# Patient Record
Sex: Female | Born: 1940
Health system: Southern US, Community
[De-identification: ages and names within clinical notes are randomized; demographics above are authoritative.]

## PROBLEM LIST (undated history)

## (undated) DIAGNOSIS — I351 Nonrheumatic aortic (valve) insufficiency: Secondary | ICD-10-CM

## (undated) DIAGNOSIS — K224 Dyskinesia of esophagus: Secondary | ICD-10-CM

## (undated) DIAGNOSIS — D649 Anemia, unspecified: Secondary | ICD-10-CM

## (undated) DIAGNOSIS — F32A Depression, unspecified: Secondary | ICD-10-CM

## (undated) DIAGNOSIS — I951 Orthostatic hypotension: Secondary | ICD-10-CM

## (undated) DIAGNOSIS — T7840XA Allergy, unspecified, initial encounter: Secondary | ICD-10-CM

## (undated) DIAGNOSIS — R0600 Dyspnea, unspecified: Secondary | ICD-10-CM

## (undated) DIAGNOSIS — E785 Hyperlipidemia, unspecified: Secondary | ICD-10-CM

## (undated) DIAGNOSIS — Z8489 Family history of other specified conditions: Secondary | ICD-10-CM

## (undated) DIAGNOSIS — I4510 Unspecified right bundle-branch block: Secondary | ICD-10-CM

## (undated) DIAGNOSIS — N879 Dysplasia of cervix uteri, unspecified: Secondary | ICD-10-CM

## (undated) DIAGNOSIS — Z972 Presence of dental prosthetic device (complete) (partial): Secondary | ICD-10-CM

## (undated) DIAGNOSIS — F419 Anxiety disorder, unspecified: Secondary | ICD-10-CM

## (undated) DIAGNOSIS — R7303 Prediabetes: Secondary | ICD-10-CM

## (undated) DIAGNOSIS — E039 Hypothyroidism, unspecified: Secondary | ICD-10-CM

## (undated) DIAGNOSIS — R079 Chest pain, unspecified: Secondary | ICD-10-CM

## (undated) DIAGNOSIS — R42 Dizziness and giddiness: Secondary | ICD-10-CM

## (undated) DIAGNOSIS — K219 Gastro-esophageal reflux disease without esophagitis: Secondary | ICD-10-CM

## (undated) DIAGNOSIS — R0609 Other forms of dyspnea: Secondary | ICD-10-CM

## (undated) DIAGNOSIS — M1990 Unspecified osteoarthritis, unspecified site: Secondary | ICD-10-CM

## (undated) DIAGNOSIS — C4490 Unspecified malignant neoplasm of skin, unspecified: Secondary | ICD-10-CM

## (undated) DIAGNOSIS — I5189 Other ill-defined heart diseases: Secondary | ICD-10-CM

## (undated) DIAGNOSIS — G4730 Sleep apnea, unspecified: Secondary | ICD-10-CM

## (undated) DIAGNOSIS — J189 Pneumonia, unspecified organism: Secondary | ICD-10-CM

## (undated) DIAGNOSIS — E119 Type 2 diabetes mellitus without complications: Secondary | ICD-10-CM

## (undated) DIAGNOSIS — I700 Atherosclerosis of aorta: Secondary | ICD-10-CM

## (undated) HISTORY — DX: Anemia, unspecified: D64.9

## (undated) HISTORY — DX: Unspecified malignant neoplasm of skin, unspecified: C44.90

## (undated) HISTORY — DX: Type 2 diabetes mellitus without complications: E11.9

## (undated) HISTORY — DX: Hypothyroidism, unspecified: E03.9

## (undated) HISTORY — DX: Allergy, unspecified, initial encounter: T78.40XA

## (undated) HISTORY — DX: Chest pain, unspecified: R07.9

## (undated) HISTORY — DX: Dysplasia of cervix uteri, unspecified: N87.9

## (undated) HISTORY — DX: Anxiety disorder, unspecified: F41.9

## (undated) HISTORY — PX: OTHER SURGICAL HISTORY: SHX169

## (undated) HISTORY — DX: Gastro-esophageal reflux disease without esophagitis: K21.9

## (undated) HISTORY — DX: Orthostatic hypotension: I95.1

## (undated) HISTORY — PX: EYE SURGERY: SHX253

## (undated) HISTORY — PX: AUGMENTATION MAMMAPLASTY: SUR837

## (undated) HISTORY — PX: TONSILLECTOMY: SUR1361

## (undated) HISTORY — DX: Prediabetes: R73.03

## (undated) HISTORY — DX: Depression, unspecified: F32.A

## (undated) HISTORY — DX: Unspecified right bundle-branch block: I45.10

## (undated) HISTORY — DX: Dizziness and giddiness: R42

## (undated) HISTORY — PX: BREAST EXCISIONAL BIOPSY: SUR124

## (undated) HISTORY — DX: Hyperlipidemia, unspecified: E78.5

---

## 1999-07-01 ENCOUNTER — Encounter: Admission: RE | Admit: 1999-07-01 | Discharge: 1999-07-01 | Payer: Self-pay | Admitting: *Deleted

## 1999-07-01 ENCOUNTER — Encounter: Payer: Self-pay | Admitting: *Deleted

## 1999-07-09 ENCOUNTER — Other Ambulatory Visit: Admission: RE | Admit: 1999-07-09 | Discharge: 1999-07-09 | Payer: Self-pay | Admitting: *Deleted

## 2000-07-01 ENCOUNTER — Encounter: Payer: Self-pay | Admitting: *Deleted

## 2000-07-01 ENCOUNTER — Encounter: Admission: RE | Admit: 2000-07-01 | Discharge: 2000-07-01 | Payer: Self-pay | Admitting: *Deleted

## 2000-08-28 ENCOUNTER — Other Ambulatory Visit: Admission: RE | Admit: 2000-08-28 | Discharge: 2000-08-28 | Payer: Self-pay | Admitting: *Deleted

## 2000-08-28 ENCOUNTER — Encounter (INDEPENDENT_AMBULATORY_CARE_PROVIDER_SITE_OTHER): Payer: Self-pay | Admitting: Specialist

## 2001-06-09 HISTORY — PX: CARDIAC CATHETERIZATION: SHX172

## 2001-07-05 ENCOUNTER — Encounter: Payer: Self-pay | Admitting: *Deleted

## 2001-07-05 ENCOUNTER — Encounter: Admission: RE | Admit: 2001-07-05 | Discharge: 2001-07-05 | Payer: Self-pay | Admitting: *Deleted

## 2002-07-14 ENCOUNTER — Encounter: Payer: Self-pay | Admitting: *Deleted

## 2002-07-14 ENCOUNTER — Encounter: Admission: RE | Admit: 2002-07-14 | Discharge: 2002-07-14 | Payer: Self-pay | Admitting: *Deleted

## 2002-07-19 ENCOUNTER — Other Ambulatory Visit: Admission: RE | Admit: 2002-07-19 | Discharge: 2002-07-19 | Payer: Self-pay | Admitting: *Deleted

## 2002-08-09 ENCOUNTER — Encounter: Admission: RE | Admit: 2002-08-09 | Discharge: 2002-08-09 | Payer: Self-pay | Admitting: *Deleted

## 2002-08-09 ENCOUNTER — Encounter: Payer: Self-pay | Admitting: *Deleted

## 2002-09-13 ENCOUNTER — Other Ambulatory Visit: Admission: RE | Admit: 2002-09-13 | Discharge: 2002-09-13 | Payer: Self-pay | Admitting: *Deleted

## 2003-01-30 ENCOUNTER — Other Ambulatory Visit: Admission: RE | Admit: 2003-01-30 | Discharge: 2003-01-30 | Payer: Self-pay | Admitting: *Deleted

## 2003-05-31 ENCOUNTER — Other Ambulatory Visit: Admission: RE | Admit: 2003-05-31 | Discharge: 2003-05-31 | Payer: Self-pay | Admitting: *Deleted

## 2003-07-24 ENCOUNTER — Other Ambulatory Visit: Admission: RE | Admit: 2003-07-24 | Discharge: 2003-07-24 | Payer: Self-pay | Admitting: *Deleted

## 2003-08-14 ENCOUNTER — Encounter: Admission: RE | Admit: 2003-08-14 | Discharge: 2003-08-14 | Payer: Self-pay | Admitting: *Deleted

## 2003-11-16 ENCOUNTER — Other Ambulatory Visit: Admission: RE | Admit: 2003-11-16 | Discharge: 2003-11-16 | Payer: Self-pay | Admitting: *Deleted

## 2004-08-14 ENCOUNTER — Other Ambulatory Visit: Admission: RE | Admit: 2004-08-14 | Discharge: 2004-08-14 | Payer: Self-pay | Admitting: *Deleted

## 2004-08-14 ENCOUNTER — Encounter: Admission: RE | Admit: 2004-08-14 | Discharge: 2004-08-14 | Payer: Self-pay | Admitting: *Deleted

## 2004-09-28 ENCOUNTER — Ambulatory Visit: Payer: Self-pay | Admitting: Internal Medicine

## 2005-01-11 LAB — HM COLONOSCOPY: HM Colonoscopy: NORMAL

## 2005-02-18 ENCOUNTER — Other Ambulatory Visit: Admission: RE | Admit: 2005-02-18 | Discharge: 2005-02-18 | Payer: Self-pay | Admitting: *Deleted

## 2005-09-02 ENCOUNTER — Other Ambulatory Visit: Admission: RE | Admit: 2005-09-02 | Discharge: 2005-09-02 | Payer: Self-pay | Admitting: *Deleted

## 2005-09-02 ENCOUNTER — Encounter: Admission: RE | Admit: 2005-09-02 | Discharge: 2005-09-02 | Payer: Self-pay | Admitting: *Deleted

## 2005-10-07 ENCOUNTER — Ambulatory Visit: Payer: Self-pay | Admitting: Gastroenterology

## 2005-11-06 ENCOUNTER — Emergency Department: Payer: Self-pay | Admitting: Emergency Medicine

## 2005-11-09 ENCOUNTER — Emergency Department: Payer: Self-pay | Admitting: Emergency Medicine

## 2005-12-30 ENCOUNTER — Encounter: Payer: Self-pay | Admitting: Infectious Diseases

## 2006-01-07 ENCOUNTER — Encounter: Payer: Self-pay | Admitting: Infectious Diseases

## 2006-02-07 ENCOUNTER — Encounter: Payer: Self-pay | Admitting: Infectious Diseases

## 2006-02-17 ENCOUNTER — Encounter: Payer: Self-pay | Admitting: Cardiovascular Disease

## 2006-04-20 ENCOUNTER — Other Ambulatory Visit: Admission: RE | Admit: 2006-04-20 | Discharge: 2006-04-20 | Payer: Self-pay | Admitting: *Deleted

## 2006-09-23 ENCOUNTER — Other Ambulatory Visit: Admission: RE | Admit: 2006-09-23 | Discharge: 2006-09-23 | Payer: Self-pay | Admitting: *Deleted

## 2006-09-23 ENCOUNTER — Encounter: Admission: RE | Admit: 2006-09-23 | Discharge: 2006-09-23 | Payer: Self-pay | Admitting: *Deleted

## 2006-11-03 ENCOUNTER — Ambulatory Visit: Payer: Self-pay | Admitting: Ophthalmology

## 2006-11-03 ENCOUNTER — Other Ambulatory Visit: Payer: Self-pay

## 2006-11-09 ENCOUNTER — Ambulatory Visit: Payer: Self-pay | Admitting: Ophthalmology

## 2006-11-19 ENCOUNTER — Other Ambulatory Visit: Admission: RE | Admit: 2006-11-19 | Discharge: 2006-11-19 | Payer: Self-pay | Admitting: *Deleted

## 2007-03-08 ENCOUNTER — Other Ambulatory Visit: Admission: RE | Admit: 2007-03-08 | Discharge: 2007-03-08 | Payer: Self-pay | Admitting: *Deleted

## 2007-05-05 ENCOUNTER — Ambulatory Visit: Payer: Self-pay | Admitting: Internal Medicine

## 2007-08-31 ENCOUNTER — Other Ambulatory Visit: Admission: RE | Admit: 2007-08-31 | Discharge: 2007-08-31 | Payer: Self-pay | Admitting: *Deleted

## 2007-09-24 ENCOUNTER — Encounter: Admission: RE | Admit: 2007-09-24 | Discharge: 2007-09-24 | Payer: Self-pay | Admitting: *Deleted

## 2007-11-16 ENCOUNTER — Emergency Department: Payer: Self-pay | Admitting: Emergency Medicine

## 2007-12-22 ENCOUNTER — Ambulatory Visit: Payer: Self-pay | Admitting: Internal Medicine

## 2008-01-05 ENCOUNTER — Other Ambulatory Visit: Admission: RE | Admit: 2008-01-05 | Discharge: 2008-01-05 | Payer: Self-pay | Admitting: Gynecology

## 2008-06-13 ENCOUNTER — Other Ambulatory Visit: Admission: RE | Admit: 2008-06-13 | Discharge: 2008-06-13 | Payer: Self-pay | Admitting: Gynecology

## 2008-09-25 ENCOUNTER — Encounter: Admission: RE | Admit: 2008-09-25 | Discharge: 2008-09-25 | Payer: Self-pay | Admitting: Gynecology

## 2008-10-11 ENCOUNTER — Ambulatory Visit: Payer: Self-pay | Admitting: Internal Medicine

## 2009-04-05 ENCOUNTER — Other Ambulatory Visit: Admission: RE | Admit: 2009-04-05 | Discharge: 2009-04-05 | Payer: Self-pay | Admitting: Gynecologic Oncology

## 2009-04-05 ENCOUNTER — Ambulatory Visit: Admission: RE | Admit: 2009-04-05 | Discharge: 2009-04-05 | Payer: Self-pay | Admitting: Gynecologic Oncology

## 2009-04-05 ENCOUNTER — Encounter: Payer: Self-pay | Admitting: Gynecologic Oncology

## 2009-05-07 ENCOUNTER — Ambulatory Visit (HOSPITAL_COMMUNITY): Admission: RE | Admit: 2009-05-07 | Discharge: 2009-05-07 | Payer: Self-pay | Admitting: Gynecologic Oncology

## 2009-05-23 ENCOUNTER — Ambulatory Visit (HOSPITAL_BASED_OUTPATIENT_CLINIC_OR_DEPARTMENT_OTHER): Admission: RE | Admit: 2009-05-23 | Discharge: 2009-05-23 | Payer: Self-pay | Admitting: Gynecologic Oncology

## 2009-07-19 ENCOUNTER — Ambulatory Visit: Admission: RE | Admit: 2009-07-19 | Discharge: 2009-07-19 | Payer: Self-pay | Admitting: Gynecologic Oncology

## 2009-10-16 ENCOUNTER — Encounter: Admission: RE | Admit: 2009-10-16 | Discharge: 2009-10-16 | Payer: Self-pay | Admitting: Gynecology

## 2009-12-18 ENCOUNTER — Ambulatory Visit: Payer: Self-pay | Admitting: Internal Medicine

## 2009-12-19 ENCOUNTER — Ambulatory Visit: Admission: RE | Admit: 2009-12-19 | Discharge: 2009-12-19 | Payer: Self-pay | Admitting: Gynecology

## 2009-12-19 ENCOUNTER — Other Ambulatory Visit: Admission: RE | Admit: 2009-12-19 | Discharge: 2009-12-19 | Payer: Self-pay | Admitting: Gynecology

## 2010-03-28 ENCOUNTER — Ambulatory Visit: Admission: RE | Admit: 2010-03-28 | Discharge: 2010-03-28 | Payer: Self-pay | Admitting: Gynecologic Oncology

## 2010-03-28 ENCOUNTER — Other Ambulatory Visit: Admission: RE | Admit: 2010-03-28 | Discharge: 2010-03-28 | Payer: Self-pay | Admitting: Gynecologic Oncology

## 2010-06-25 ENCOUNTER — Ambulatory Visit: Payer: Self-pay | Admitting: Ophthalmology

## 2010-07-01 ENCOUNTER — Ambulatory Visit: Payer: Self-pay | Admitting: Ophthalmology

## 2010-08-29 ENCOUNTER — Ambulatory Visit: Payer: Self-pay | Admitting: Internal Medicine

## 2010-09-10 LAB — GLUCOSE, CAPILLARY
Glucose-Capillary: 116 mg/dL — ABNORMAL HIGH (ref 70–99)
Glucose-Capillary: 87 mg/dL (ref 70–99)

## 2010-09-11 LAB — DIFFERENTIAL
Basophils Absolute: 0 10*3/uL (ref 0.0–0.1)
Basophils Relative: 0 % (ref 0–1)
Eosinophils Relative: 2 % (ref 0–5)
Lymphocytes Relative: 20 % (ref 12–46)
Lymphs Abs: 1.7 10*3/uL (ref 0.7–4.0)
Monocytes Relative: 7 % (ref 3–12)
Neutro Abs: 6 10*3/uL (ref 1.7–7.7)

## 2010-09-11 LAB — CBC
Hemoglobin: 12.6 g/dL (ref 12.0–15.0)
MCHC: 32.9 g/dL (ref 30.0–36.0)
Platelets: 196 10*3/uL (ref 150–400)
WBC: 8.6 10*3/uL (ref 4.0–10.5)

## 2010-09-11 LAB — BASIC METABOLIC PANEL
Calcium: 9.7 mg/dL (ref 8.4–10.5)
GFR calc non Af Amer: >60 mL/min (ref >60)
Glucose, Bld: 119 mg/dL — ABNORMAL HIGH (ref 70–99)
Potassium: 4.7 mEq/L (ref 3.5–5.1)

## 2010-09-12 ENCOUNTER — Ambulatory Visit (INDEPENDENT_AMBULATORY_CARE_PROVIDER_SITE_OTHER): Payer: Medicare Other | Admitting: Cardiovascular Disease

## 2010-09-12 ENCOUNTER — Encounter: Payer: Self-pay | Admitting: Cardiovascular Disease

## 2010-09-12 DIAGNOSIS — R55 Syncope and collapse: Secondary | ICD-10-CM

## 2010-09-12 DIAGNOSIS — R42 Dizziness and giddiness: Secondary | ICD-10-CM | POA: Insufficient documentation

## 2010-09-12 DIAGNOSIS — I10 Essential (primary) hypertension: Secondary | ICD-10-CM | POA: Insufficient documentation

## 2010-09-12 MED ORDER — HYDROCHLOROTHIAZIDE 25 MG PO TABS: 25.0000 mg | ORAL_TABLET | ORAL | Status: DC | PRN

## 2010-09-12 NOTE — Progress Notes (Signed)
   Patient ID: Megan Bradley, female    DOB: [birth year], 70 y.o.   MRN: [id number]  HPI Comments: [name] is a very pleasant 70 year old woman, patient of Dr. Darrick Huntsman who has had a history of syncope over the past 2 years with at least 4 episodes of uncertain etiology. She presents for evaluation.  She reports having episodes dating back 2 years. She seems to think it is her balance and that is she stands up too quickly, she has dizziness. She does have problems with inner ear and dizziness and if she moves her head up and down quickly she can reproduce a dizzy feeling in her head. She's had several CT scans, MRI which per her report were normal.  She did have an episode 2 weeks ago where she had a hot shower and felt dizzy and then passed out. A additional episode, she got up from the bed went into the bathroom, the bathroom was dark and she cannot find a light switch. She got up because of leg cramping and was trying to stretch her legs on the cold bathroom floor. She was dizzy going into the bathroom and then passed out.  In the past she has had chest pain and jaw pain episodes and has had workup at [facility] including an echocardiogram and cardiac catheter 5 years ago. This was reportedly normal apart from diastolic dysfunction. She was started on low-dose lisinopril and HCTZ. She also takes HCTZ for edema of her hands.  EKG shows normal sinus rhythm with rate 62 beats per minute with right bundle branch block, no other significant ST or T wave changes     Review of Systems  Constitutional: Negative.   HENT: Negative.   Eyes: Negative.   Respiratory: Negative.   Cardiovascular: Negative.   Gastrointestinal: Negative.   Musculoskeletal: Negative.        Poor balance.  Skin: Negative.   Neurological: Positive for dizziness and syncope.  Hematological: Negative.   Psychiatric/Behavioral: Negative.     BP 106/60  Pulse 62  Ht 5\' 6"  (1.676 m)  Wt 148 lb 12.8 oz (67.495 kg)  BMI 24.02 kg/m2 Of  note, with standing her blood pressure goes to 92/62   Physical Exam  Nursing note and vitals reviewed. Constitutional: She is oriented to person, place, and time. She appears well-developed and well-nourished.  HENT:  Head: Normocephalic.  Nose: Nose normal.  Mouth/Throat: Oropharynx is clear and moist.  Eyes: Conjunctivae are normal. Pupils are equal, round, and reactive to light.  Neck: Normal range of motion. Neck supple. No JVD present.  Cardiovascular: Normal rate, regular rhythm, normal heart sounds and intact distal pulses.  Exam reveals no gallop and no friction rub.   No murmur heard. Pulmonary/Chest: Effort normal and breath sounds normal. No respiratory distress. She has no wheezes. She has no rales. She exhibits no tenderness.  Abdominal: Soft. Bowel sounds are normal. She exhibits no distension. There is no tenderness.  Musculoskeletal: Normal range of motion. She exhibits no edema and no tenderness.  Lymphadenopathy:    She has no cervical adenopathy.  Neurological: She is alert and oriented to person, place, and time. Coordination normal.  Skin: Skin is warm and dry. No rash noted. No erythema.  Psychiatric: She has a normal mood and affect. Her behavior is normal. Judgment and thought content normal.         Assessment and Plan

## 2010-09-12 NOTE — Assessment & Plan Note (Signed)
If there is no significant improvement of her dizziness and syncopal episodes with holding her medications, we may have to proceed with additional workup including possibly a Holter monitor/event monitor, possibly a tilt table test.  We have mentioned, if the tilt table was positive, we will recommend holding all blood pressure medications, fluid loading, salt loading, possibly TED hose.

## 2010-09-12 NOTE — Assessment & Plan Note (Signed)
Blood pressure has been borderline low on the records that she brings to the office today. We'll continue to monitor her pressure after holding lisinopril and HCTZ. Per her report, her echo 5 years ago likely show diastolic dysfunction. This is a nonspecific finding and likely not the cause of any other clinical symptoms such as her previous chest pain.

## 2010-09-12 NOTE — Patient Instructions (Signed)
HOLD HCTZ, only take as needed for swelling. HOLD Lisinopril. Your physician recommends that you schedule a follow-up appointment in: 1 months

## 2010-09-12 NOTE — Assessment & Plan Note (Signed)
She does have 2 types of dizziness. He first seems to be in her ear, possibly benign positional vertigo that has been chronic.  The second type of dizziness appears to be orthostasis. She was able to reproduce this in the office by squatting down and then standing up. She felt dizzy like she does before she passes out. Blood pressure was low in our office on standing. I am concerned about her HCTZ and lisinopril. She has been monitoring her blood pressure and does have documentation of systolic pressures occasionally in the 90s.  We have suggested she hold her lisinopril and HCTZ for now, possibly taking HCTZ for severe swelling of her fingers. Like to track her symptoms to see if her orthostasis improves.

## 2010-09-17 ENCOUNTER — Ambulatory Visit: Payer: Medicare Other | Admitting: Cardiovascular Disease

## 2010-09-24 ENCOUNTER — Other Ambulatory Visit: Payer: Self-pay | Admitting: Gynecology

## 2010-09-24 DIAGNOSIS — Z1231 Encounter for screening mammogram for malignant neoplasm of breast: Secondary | ICD-10-CM

## 2010-09-27 ENCOUNTER — Encounter: Payer: Self-pay | Admitting: Cardiovascular Disease

## 2010-10-01 ENCOUNTER — Encounter: Payer: Self-pay | Admitting: Cardiovascular Disease

## 2010-10-03 DIAGNOSIS — C44310 Basal cell carcinoma of skin of unspecified parts of face: Secondary | ICD-10-CM | POA: Insufficient documentation

## 2010-10-16 ENCOUNTER — Encounter: Payer: Self-pay | Admitting: Cardiovascular Disease

## 2010-10-16 ENCOUNTER — Ambulatory Visit (INDEPENDENT_AMBULATORY_CARE_PROVIDER_SITE_OTHER): Payer: Medicare Other | Admitting: Cardiovascular Disease

## 2010-10-16 DIAGNOSIS — I10 Essential (primary) hypertension: Secondary | ICD-10-CM

## 2010-10-16 DIAGNOSIS — R42 Dizziness and giddiness: Secondary | ICD-10-CM

## 2010-10-16 DIAGNOSIS — R55 Syncope and collapse: Secondary | ICD-10-CM

## 2010-10-16 NOTE — Patient Instructions (Signed)
You are doing well. No medication changes were made. Please call us if you have new issues that need to be addressed before your next appt.  We will call you for a follow up Appt. In 6 months  

## 2010-10-16 NOTE — Progress Notes (Signed)
   Patient ID: Megan Bradley, female    DOB: 04/24/1941, 70 y.o.   MRN: [id number]  HPI Comments: Ms. Murren is a 70 year old woman, patient of Dr. Darrick Huntsman who has had a history of syncope over the past 2 years with at least 4 episodes of uncertain etiology. On her last visit, we held her lisinopril and HCTZ. She presents for routine followup.  She reports that she has been doing well. She denies any near syncope or syncope. She has been monitoring her blood pressure and these numbers show a blood pressure ranging from 105 systolic to 130, diastolic in the 60s to 70s. Blood sugars in the 80s to 110. She feels well and has no complaints.   echocardiogram and cardiac catheter 5 years ago. This was reportedly normal apart from diastolic dysfunction.   Old EKG shows normal sinus rhythm with rate 62 beats per minute with right bundle branch block, no other significant ST or T wave changes      Review of Systems  Constitutional: Negative.   HENT: Negative.   Eyes: Negative.   Respiratory: Negative.   Cardiovascular: Negative.   Gastrointestinal: Negative.   Musculoskeletal: Negative.   Skin: Negative.   Neurological: Negative.   Hematological: Negative.   Psychiatric/Behavioral: Negative.   All other systems reviewed and are negative.    BP 100/60  Pulse 68  Ht 5\' 6"  (1.676 m)  Wt 149 lb 12.8 oz (67.949 kg)  BMI 24.18 kg/m2   Physical Exam  Nursing note and vitals reviewed. Constitutional: She is oriented to person, place, and time. She appears well-developed and well-nourished.       Recent surgery on her lip for a basal cell removal with bandage in place.  HENT:  Head: Normocephalic.  Nose: Nose normal.  Mouth/Throat: Oropharynx is clear and moist.  Eyes: Conjunctivae are normal. Pupils are equal, round, and reactive to light.  Neck: Normal range of motion. Neck supple. No JVD present.  Cardiovascular: Normal rate, regular rhythm, S1 normal, S2 normal, normal heart sounds and  intact distal pulses.  Exam reveals no gallop and no friction rub.   No murmur heard. Pulmonary/Chest: Effort normal and breath sounds normal. No respiratory distress. She has no wheezes. She has no rales. She exhibits no tenderness.  Abdominal: Soft. Bowel sounds are normal. She exhibits no distension. There is no tenderness.  Musculoskeletal: Normal range of motion. She exhibits no edema and no tenderness.  Lymphadenopathy:    She has no cervical adenopathy.  Neurological: She is alert and oriented to person, place, and time. Coordination normal.  Skin: Skin is warm and dry. No rash noted. No erythema.  Psychiatric: She has a normal mood and affect. Her behavior is normal. Judgment and thought content normal.         Assessment and Plan

## 2010-10-16 NOTE — Assessment & Plan Note (Addendum)
No further episodes of syncope. I suspect her episodes could have been secondary to labile blood pressure. She feels well off the medications and we have suggested she continue to hold her lisinopril and HCTZ given that her blood pressure well controlled.  I suggested that she continue mild salt loading, continue fluid hydration. If she has additional episodes of near syncope or syncope, I suggested that she contact our office.

## 2010-10-21 ENCOUNTER — Ambulatory Visit: Payer: Medicare Other | Admitting: Cardiovascular Disease

## 2010-11-05 ENCOUNTER — Ambulatory Visit
Admission: RE | Admit: 2010-11-05 | Discharge: 2010-11-05 | Disposition: A | Discharge status: Home / Self Care | Payer: Medicare Other | Source: Ambulatory Visit | Attending: Gynecology | Admitting: Gynecology

## 2010-11-05 DIAGNOSIS — Z1231 Encounter for screening mammogram for malignant neoplasm of breast: Secondary | ICD-10-CM

## 2011-01-12 LAB — HM DEXA SCAN

## 2011-01-27 ENCOUNTER — Telehealth: Payer: Self-pay | Admitting: Internal Medicine

## 2011-01-27 NOTE — Telephone Encounter (Signed)
I called [facility] at [phone] and spoke with [place] and she said that she would fax over the two new prescriptions needed for patient.  She stated that [facility] had faxed a request on the 12th of August, I explained to her that Dr. Darrick Huntsman had just moved into the new practice  I also gave her the new phone/fax number for the new practice.  She said that they will fax over the two prescriptions, she wouldn't let me know what the 2 medications were.

## 2011-01-30 MED ORDER — CITALOPRAM HYDROBROMIDE 40 MG PO TABS: 40.0000 mg | ORAL_TABLET | Freq: Every day | ORAL | Status: DC

## 2011-01-30 MED ORDER — OMEPRAZOLE 40 MG PO CPDR: 40.0000 mg | DELAYED_RELEASE_CAPSULE | Freq: Every day | ORAL | Status: DC

## 2011-01-30 NOTE — Telephone Encounter (Signed)
Patient called and gave the medications names and strengths, I have faxed citalopram and omeprazole to [facility].

## 2011-03-12 ENCOUNTER — Encounter: Payer: Self-pay | Admitting: Internal Medicine

## 2011-03-12 ENCOUNTER — Ambulatory Visit (INDEPENDENT_AMBULATORY_CARE_PROVIDER_SITE_OTHER): Payer: Medicare Other | Admitting: Internal Medicine

## 2011-03-12 VITALS — BP 132/83 | HR 70 | Temp 98.4°F | Resp 16 | Ht 65.5 in | Wt 149.2 lb

## 2011-03-12 DIAGNOSIS — J019 Acute sinusitis, unspecified: Secondary | ICD-10-CM

## 2011-03-12 DIAGNOSIS — R55 Syncope and collapse: Secondary | ICD-10-CM

## 2011-03-12 DIAGNOSIS — Z1239 Encounter for other screening for malignant neoplasm of breast: Secondary | ICD-10-CM

## 2011-03-12 DIAGNOSIS — J40 Bronchitis, not specified as acute or chronic: Secondary | ICD-10-CM

## 2011-03-12 MED ORDER — HYDROCODONE-ACETAMINOPHEN 5-500 MG PO TABS: 2.0000 | ORAL_TABLET | Freq: Four times a day (QID) | ORAL | Status: DC | PRN

## 2011-03-12 MED ORDER — AMOXICILLIN 500 MG PO CAPS: 500.0000 mg | ORAL_CAPSULE | Freq: Three times a day (TID) | ORAL | Status: AC

## 2011-03-12 NOTE — Patient Instructions (Signed)
Take the amoxicillin three times daily for seven days  Use the hydrcodone to suppress your cough;  It is the same ingredient as tussionex

## 2011-03-13 ENCOUNTER — Encounter: Payer: Self-pay | Admitting: Internal Medicine

## 2011-03-13 DIAGNOSIS — E119 Type 2 diabetes mellitus without complications: Secondary | ICD-10-CM | POA: Insufficient documentation

## 2011-03-13 DIAGNOSIS — Z1239 Encounter for other screening for malignant neoplasm of breast: Secondary | ICD-10-CM | POA: Insufficient documentation

## 2011-03-13 DIAGNOSIS — J019 Acute sinusitis, unspecified: Secondary | ICD-10-CM | POA: Insufficient documentation

## 2011-03-13 DIAGNOSIS — E032 Hypothyroidism due to medicaments and other exogenous substances: Secondary | ICD-10-CM | POA: Insufficient documentation

## 2011-03-13 DIAGNOSIS — K219 Gastro-esophageal reflux disease without esophagitis: Secondary | ICD-10-CM | POA: Insufficient documentation

## 2011-03-13 DIAGNOSIS — F418 Other specified anxiety disorders: Secondary | ICD-10-CM | POA: Insufficient documentation

## 2011-03-13 NOTE — Assessment & Plan Note (Signed)
With facial pain, purulent nasal drainage. Loss of voice, productive cough .  Lasting over one week. Will treat empirically with abx.

## 2011-03-13 NOTE — Progress Notes (Deleted)
  Subjective:    Patient ID: Megan Bradley, female    DOB: 1941-02-04, 70 y.o.   MRN: [id number]  HPI    Review of Systems     Objective:   Physical Exam        Assessment & Plan:

## 2011-03-13 NOTE — Assessment & Plan Note (Addendum)
Attributed to labiolt hypertension.  She has had No episodes since Sp[ring 2012.  Cardiology and neurology workups have been done  Normal MRI/MRA brain 2009.  Normal cardiac cath 2003 by [facility], diastolic dysfunction by prior ECHO.  Taken off bp meds by Dr. Mariah Milling in May .

## 2011-03-26 ENCOUNTER — Other Ambulatory Visit: Payer: Self-pay | Admitting: Internal Medicine

## 2011-03-26 ENCOUNTER — Encounter: Payer: Self-pay | Admitting: Internal Medicine

## 2011-03-26 NOTE — Progress Notes (Signed)
  Subjective:    Patient ID: Megan Bradley, female    DOB: [birth year], 70 y.o.   MRN: [id number]  HPI    Review of Systems     Objective:   Physical Exam        Assessment & Plan:   Subjective:     Megan Bradley is a 70 y.o. female who presents for evaluation of sinus pain. Symptoms include: congestion, cough, frequent clearing of the throat, purulent rhinorrhea, sinus pressure and sore throat. Onset of symptoms was 7 days ago. Symptoms have been unchanged since that time. Past history is significant for occasional episodes of bronchitis. Patient is a non-smoker.  The following portions of the patient's history were reviewed and updated as appropriate: allergies, current medications, past family history, past medical history, past social history, past surgical history and problem list.  Review of Systems Pertinent items are noted in HPI.   Objective:    General appearance: alert, cooperative and appears stated age Eyes: conjunctivae/corneas clear. PERRL, EOM's intact. Fundi benign. Ears: normal TM's and external ear canals both ears Nose: Nares normal. Septum midline. Mucosa normal. No drainage or sinus tenderness., mild congestion, sinus tenderness bilateral, mild swelling Neck: anterior cervical adenopathy, no carotid bruit, no JVD, supple, symmetrical, trachea midline and thyroid not enlarged, symmetric, no tenderness/mass/nodules Lungs: clear to auscultation bilaterally Heart: regular rate and rhythm, S1, S2 normal, no murmur, click, rub or gallop    Assessment:    Acute bacterial sinusitis.    Plan:    Nasal saline sprays. Antihistamines per medication orders. Azithromycin per medication orders.

## 2011-03-26 NOTE — Progress Notes (Signed)
This is an addendum to the office note for [name]'s Oct 3 visit for sinusitis.  Subjective:     Megan Bradley is a 70 y.o. female who presents for evaluation of sinus pain. Symptoms include: congestion, cough, facial pain, frequent clearing of the throat, mouth breathing, nasal congestion, post nasal drip, puffiness of the eyes, sinus pressure and spitting/vomiting mucous. Onset of symptoms was 7 days ago. Symptoms have been unchanged since that time. Past history is significant for asthma and occasional episodes of bronchitis. Patient is a never smoker.  The following portions of the patient's history were reviewed and updated as appropriate: allergies, current medications, past family history, past medical history, past social history, past surgical history and problem list.  Review of Systems Pertinent items are noted in HPI.   Objective:    There were no vitals taken for this visit. General appearance: alert, cooperative and appears stated age Eyes: conjunctivae/corneas clear. PERRL, EOM's intact. Fundi benign. Ears: normal TM's and external ear canals both ears Nose: Nares normal. Septum midline. Mucosa normal. No drainage or sinus tenderness., moderate congestion, sinus tenderness bilateral, mild swelling Throat: abnormal findings: mild oropharyngeal erythema Lungs: clear to auscultation bilaterally Heart: regular rate and rhythm, S1, S2 normal, no murmur, click, rub or gallop    Assessment:    Acute bacterial sinusitis.    Plan:    Nasal saline sprays. Antihistamines per medication orders. Azithromycin per medication orders. Follow up in 7 days or as needed.

## 2011-03-28 NOTE — Progress Notes (Addendum)
  Subjective:    Patient ID: Megan Bradley, female    DOB: 1940-12-18, 70 y.o.   MRN: [id number]  HPI Megan Bradley is a 70 y.o. female who presents for evaluation of sinus pain. Symptoms include: congestion, cough, frequent clearing of the throat, purulent rhinorrhea, sinus pressure and sore throat. Onset of symptoms was 7 days ago. Symptoms have been unchanged since that time. Past history is significant for occasional episodes of bronchitis. Patient is a non-smoker.  The following portions of the patient's history were reviewed and updated as appropriate: allergies, current medications, past family history, past medical history, past social history, past surgical history and problem list.     Review of Systems Pertinent items are noted in HPI     Objective:   Physical Exam  General appearance: alert, cooperative and appears stated age  Eyes: conjunctivae/corneas clear. PERRL, EOM's intact. Fundi benign.  Ears: normal TM's and external ear canals both ears  Nose: Nares normal. Septum midline. Mucosa normal. No drainage or sinus tenderness., mild congestion, sinus tenderness bilateral, mild swelling  Neck: anterior cervical adenopathy, no carotid bruit, no JVD, supple, symmetrical, trachea midline and thyroid not enlarged, symmetric, no tenderness/mass/nodules  Lungs: clear to auscultation bilaterally  Heart: regular rate and rhythm, S1, S2 normal, no murmur, click, rub or gallop        Assessment & Plan:  Acute bacterial sinusitis.  Nasal saline sprays.  Antihistamines per medication orders.  Azithromycin per medication orders.

## 2011-04-02 ENCOUNTER — Encounter: Payer: Self-pay | Admitting: Internal Medicine

## 2011-04-05 ENCOUNTER — Other Ambulatory Visit: Payer: Self-pay | Admitting: Internal Medicine

## 2011-04-05 DIAGNOSIS — F419 Anxiety disorder, unspecified: Secondary | ICD-10-CM

## 2011-04-06 NOTE — Telephone Encounter (Signed)
Authorization for refill of alprazolam 0,5 mg #60 with 2 refills.,  Ok to call in

## 2011-04-07 MED ORDER — ALPRAZOLAM 0.5 MG PO TABS: 0.5000 mg | ORAL_TABLET | Freq: Two times a day (BID) | ORAL | Status: DC | PRN

## 2011-04-14 ENCOUNTER — Ambulatory Visit (INDEPENDENT_AMBULATORY_CARE_PROVIDER_SITE_OTHER): Payer: Medicare Other | Admitting: Internal Medicine

## 2011-04-14 DIAGNOSIS — Z23 Encounter for immunization: Secondary | ICD-10-CM

## 2011-07-01 ENCOUNTER — Other Ambulatory Visit: Payer: Self-pay | Admitting: *Deleted

## 2011-07-01 DIAGNOSIS — F419 Anxiety disorder, unspecified: Secondary | ICD-10-CM

## 2011-07-01 NOTE — Telephone Encounter (Signed)
[facility] has faxed forms requesting new scripts for alprazolam and celexa, these forms are in your red folder.

## 2011-07-02 ENCOUNTER — Other Ambulatory Visit: Payer: Self-pay | Admitting: *Deleted

## 2011-07-02 MED ORDER — OMEPRAZOLE 40 MG PO CPDR: 40.0000 mg | DELAYED_RELEASE_CAPSULE | Freq: Every day | ORAL | Status: DC

## 2011-07-03 MED ORDER — CITALOPRAM HYDROBROMIDE 40 MG PO TABS: 40.0000 mg | ORAL_TABLET | Freq: Every day | ORAL | Status: DC

## 2011-07-03 MED ORDER — ALPRAZOLAM 0.5 MG PO TABS: 0.5000 mg | ORAL_TABLET | Freq: Two times a day (BID) | ORAL | Status: DC | PRN

## 2011-07-04 NOTE — Telephone Encounter (Signed)
Forms faxed back to [facility] 07/01/11.

## 2011-07-14 ENCOUNTER — Telehealth: Payer: Self-pay | Admitting: *Deleted

## 2011-07-14 NOTE — Telephone Encounter (Signed)
Fax from [employer] [facility].  Pt wants to change from omeprazole to pantoprazole due to there being no copay with the later medicine.  Form is in red folder.

## 2011-07-16 NOTE — Telephone Encounter (Signed)
Form faxed back to pharmacy

## 2011-07-21 ENCOUNTER — Telehealth: Payer: Self-pay | Admitting: *Deleted

## 2011-07-21 MED ORDER — PANTOPRAZOLE SODIUM 40 MG PO TBEC: 40.0000 mg | DELAYED_RELEASE_TABLET | Freq: Every day | ORAL | Status: DC

## 2011-07-21 NOTE — Telephone Encounter (Signed)
Pharmacy is asking for a strength on the pantoprazole, form is in red folder.

## 2011-07-21 NOTE — Telephone Encounter (Signed)
Script faxed.

## 2011-07-21 NOTE — Telephone Encounter (Signed)
Printed for mail order/fax

## 2011-08-24 ENCOUNTER — Emergency Department: Payer: Self-pay | Admitting: Emergency Medicine

## 2011-08-24 ENCOUNTER — Encounter: Payer: Self-pay | Admitting: Cardiovascular Disease

## 2011-08-24 LAB — CBC
HGB: 13.4 g/dL (ref 12.0–16.0)
MCH: 31.1 pg (ref 26.0–34.0)
MCHC: 33.4 g/dL (ref 32.0–36.0)
Platelet: 179 10*3/uL (ref 150–440)
RDW: 14.9 % — ABNORMAL HIGH (ref 11.5–14.5)

## 2011-08-24 LAB — COMPREHENSIVE METABOLIC PANEL
Alkaline Phosphatase: 53 U/L (ref 50–136)
BUN: 18 mg/dL (ref 7–18)
Bilirubin,Total: 0.4 mg/dL (ref 0.2–1.0)
Calcium, Total: 9.2 mg/dL (ref 8.5–10.1)
Chloride: 105 mmol/L (ref 98–107)
Co2: 27 mmol/L (ref 21–32)
Creatinine: 0.98 mg/dL (ref 0.60–1.30)
EGFR (African American): >60
EGFR (Non-African Amer.): 60 — ABNORMAL LOW
Osmolality: 289 (ref 275–301)
SGPT (ALT): 25 U/L
Sodium: 144 mmol/L (ref 136–145)
Total Protein: 7.4 g/dL (ref 6.4–8.2)

## 2011-08-24 LAB — PROTIME-INR: INR: 0.9

## 2011-08-24 LAB — TROPONIN I: Troponin-I: <0.02 ng/mL

## 2011-08-24 LAB — CK TOTAL AND CKMB (NOT AT ARMC): CK-MB: 3.4 ng/mL (ref 0.5–3.6)

## 2011-08-25 ENCOUNTER — Encounter: Payer: Self-pay | Admitting: Internal Medicine

## 2011-08-25 ENCOUNTER — Telehealth: Payer: Self-pay | Admitting: *Deleted

## 2011-08-25 ENCOUNTER — Ambulatory Visit (INDEPENDENT_AMBULATORY_CARE_PROVIDER_SITE_OTHER): Payer: Medicare Other | Admitting: Internal Medicine

## 2011-08-25 VITALS — BP 118/70 | HR 67 | Temp 98.6°F | Resp 16 | Wt 151.5 lb

## 2011-08-25 DIAGNOSIS — R079 Chest pain, unspecified: Secondary | ICD-10-CM | POA: Insufficient documentation

## 2011-08-25 DIAGNOSIS — R600 Localized edema: Secondary | ICD-10-CM | POA: Insufficient documentation

## 2011-08-25 DIAGNOSIS — R609 Edema, unspecified: Secondary | ICD-10-CM

## 2011-08-25 MED ORDER — NITROGLYCERIN 0.4 MG SL SUBL: 0.4000 mg | SUBLINGUAL_TABLET | SUBLINGUAL | Status: DC | PRN

## 2011-08-25 NOTE — Assessment & Plan Note (Addendum)
She was ruled out for DVT yesterday with a lower extremity Doppler. She has had prior trauma on the left side with ligament tears occurring in the ankle and foot. This is likely the source of her asymmetric edema. I have prescribed venous compression stockings for her in the past which she does not use regularly. I do not think that a diuretic would be helpful at this point and would probably contribute to episodes of hypotension and orthostasis.

## 2011-08-25 NOTE — Telephone Encounter (Signed)
Triage Record Num: [id number] Operator: Karenann Cai Patient Name: Megan Bradley Call Date & Time: 08/23/2011 11:25:54AM Patient Phone: [phone] PCP: Duncan Dull Patient Gender: Female PCP Fax : [phone] Patient DOB: [birth year] Practice Name: [facility] Reason for Call: Caller: Kynzie/Patient; PCP: Duncan Dull; CB#: [phone]; call regarding left leg and foot swollen for couple of days, now back of left hurting. Onset of swelling: 08/20/2011. Afebrile. Patient reports swelling has worsened: pain in the back of her left leg hurts today. Swelling did not " go down" during the night after sleep/rest. Patient reports that her left leg is sore. Patient took 4 baby ASA this morning. RN reviewed leg non-injury care advice with patient. Patient has an appointment with PCP on Monday at 10:45. Patient advised to call back anytime. Protocol(s) Used: Leg Non-Injury Recommended Outcome per Protocol: See Provider within 24 hours Reason for Outcome: New swelling of legs that does NOT resolve with rest and elevation of legs Care Advice: Position affected part so it is elevated at least 12 inches (30 cm) above level of heart to improve circulation and decrease discomfort. ~ ~ List, or take, all current prescription(s), nonprescription or alternative medication(s) to provider for evaluation. LEG CARE: - Avoid prolonged sitting or standing; take a break to move around every hour or so. - Keep legs raised when sitting, resting or sleeping; when possible raise legs above level of the heart for 20 -30 minutes. - Do not cross your legs. - Wear loose, non-restrictive clothing, especially around waist, groin area and legs. - Consider using support hose if recommended by your provider. ~ 03/

## 2011-08-25 NOTE — Progress Notes (Signed)
Patient ID: Megan Bradley, female   DOB: [birth year], 71 y.o.   MRN: [id number]     Patient Active Problem List  Diagnoses  . Dizziness  . Syncope  . HTN (hypertension)  . Hypothyroidism  . Diabetes mellitus  . GERD (gastroesophageal reflux disease)  . Anxiety  . Screening for breast cancer  . Sinusitis acute  . Edema of left lower extremity  . Chest pain radiating to jaw    Subjective:  CC:   Chief Complaint  Patient presents with  . er follow up  . Foot Pain    HPI:   Megan Bradley a 71 y.o. female who presents with  need for ER followup.  Megan Bradley was evaluated at [facility] ER yesterday for concurrent symptoms of left calf pain accompanied by asymmetric foot and leg swelling , accompanied by suffen onset of SSCP radiating to both sides of her jaw. .  Pain occurred at rest and was not relieved with hyoscyamine despite, repeated doses.  She does recall that her proton pump inhibitor had been  changed 2 weeks ago  from Prilosec to Protonix because her insurance would no longer cover Prilosec. At home prior to calling 911 she took a baby aspirin and checked her blood pressure which was 130/70 .  EMS did an EKG at her home and suggested that she go to the ER since there were some abnormalities on her EKG .   she had to serologies for troponin 6 hours apart which were apparently normal. Her pulse ox was low at 90% and a d-dimer was positive so they ruled out DVT and PE with lower sternum he Dopplers and contrasted chest CT    she remains dyspneic with exertion.  She also recalls that about 3 weeks ago she had an episode lasting about 48 to 72  hours of extreme fatigue accompanied by hypothermia body temperature was 94 she did not seek medical evaluation at that time .   Past Medical History  Diagnosis Date  . Vertigo   . Herpes zoster   . Anxiety disorder   . Orthostatic hypotension   . Coronary artery disease   . Hypothyroidism   . Diabetes mellitus   . GERD (gastroesophageal reflux  disease)   . Anxiety     Past Surgical History  Procedure Date  . Cardiac catheterization 2003    Dr.Callwood, R/L heart cath,         The following portions of the patient's history were reviewed and updated as appropriate: Allergies, current medications, and problem list.    Review of Systems:   12 Pt  review of systems was negative except those addressed in the HPI,     History   Social History  . Marital Status: Married    Spouse Name: N/A    Number of Children: N/A  . Years of Education: N/A   Occupational History  . Not on file.   Social History Main Topics  . Smoking status: Never Smoker   . Smokeless tobacco: Never Used  . Alcohol Use: Yes     wine once a week  . Drug Use: No  . Sexually Active: Not on file   Other Topics Concern  . Not on file   Social History Narrative  . No narrative on file    Objective:  BP 118/70  Pulse 67  Temp(Src) 98.6 F (37 C) (Oral)  Resp 16  Wt 151 lb 8 oz (68.72 kg)  SpO2 98%  General appearance: alert, cooperative and appears stated age Ears: normal TM's and external ear canals both ears Throat: lips, mucosa, and tongue normal; teeth and gums normal Neck: no adenopathy, no carotid bruit, supple, symmetrical, trachea midline and thyroid not enlarged, symmetric, no tenderness/mass/nodules Back: symmetric, no curvature. ROM normal. No CVA tenderness. Lungs: clear to auscultation bilaterally Heart: regular rate and rhythm, S1, S2 normal, no murmur, click, rub or gallop Abdomen: soft, non-tender; bowel sounds normal; no masses,  no organomegaly Pulses: 2+ and symmetric Skin: Skin color, texture, turgor normal. No rashes or lesions Lymph nodes: Cervical, supraclavicular, and axillary nodes normal.  Assessment and Plan:  Edema of left lower extremity She was ruled out for DVT yesterday with a lower extremity Doppler. She has had prior trauma on the left side with ligament tears occurring in the ankle and  foot. This is likely the source of her asymmetric edema. I have prescribed venous compression stockings for her in the past which she does not use regularly. I do not think that a diuretic would be helpful at this point and would probably contribute to episodes of hypotension and orthostasis.  Chest pain radiating to jaw Although she had 2 negative troponins she has multiple risk factors for coronary artery disease including history of diabetes and a family history of early coronary artery disease. She has not had an echo stress test in over 5 years. She has seen Dr. Dory Peru on in the past years for episodes of syncope accompanied by hypotension. Phlebectomy to further risk stratification with echo and further testing as he sees fit. I have given her prescription for nitroglycerin sublingual tablets to take if she has another episode.     Updated Medication List Outpatient Encounter Prescriptions as of 08/25/2011  Medication Sig Dispense Refill  . ALPRAZolam (XANAX) 0.5 MG tablet Take 1 tablet (0.5 mg total) by mouth 2 (two) times daily as needed for sleep.  60 tablet  3  . B Complex-C (SUPER B COMPLEX PO) Take 1 capsule by mouth daily.        . Cholecalciferol (VITAMIN D) 2000 UNITS CAPS Take 1 capsule by mouth daily.        . citalopram (CELEXA) 40 MG tablet Take 1 tablet (40 mg total) by mouth daily.  90 tablet  6  . ezetimibe (ZETIA) 10 MG tablet Take 10 mg by mouth daily.        Marland Kitchen HYDROcodone-acetaminophen (VICODIN) 5-500 MG per tablet Take 2 tablets by mouth every 6 (six) hours as needed (cough and rib pain).  30 tablet  0  . hyoscyamine (LEVSIN SL) 0.125 MG SL tablet Place 0.125 mg under the tongue every 4 (four) hours as needed.        Marland Kitchen levothyroxine (SYNTHROID, LEVOTHROID) 88 MCG tablet Take 88 mcg by mouth daily.        . meclizine (ANTIVERT) 25 MG tablet Take 25 mg by mouth 3 (three) times daily as needed.        . Multiple Vitamin (MULTIVITAMIN) tablet Take 1 tablet by mouth daily.         . pantoprazole (PROTONIX) 40 MG tablet Take 1 tablet (40 mg total) by mouth daily.  30 tablet  5  . nitroGLYCERIN (NITROSTAT) 0.4 MG SL tablet Place 1 tablet (0.4 mg total) under the tongue every 5 (five) minutes as needed for chest pain.  30 tablet  3  . DISCONTD: omeprazole (PRILOSEC) 40 MG capsule Take 1 capsule (40 mg total) by mouth  daily.  90 capsule  3     Orders Placed This Encounter  Procedures  . Ambulatory referral to Cardiology    No Follow-up on file.

## 2011-08-25 NOTE — Patient Instructions (Signed)
I am referring you to Dr. Mariah Milling for additional testing on your heart,.  Please use the nitroglycerin tablets if you have another episode of chest pain.  Plese wear your compression stockings to minimze the fluid retention in your left leg.

## 2011-08-25 NOTE — Assessment & Plan Note (Addendum)
Although she had 2 negative troponins she has multiple risk factors for coronary artery disease including history of diabetes and a family history of early coronary artery disease. She has not had an echo stress test in over 5 years. She has seen Dr. Dory Peru on in the past years for episodes of syncope accompanied by hypotension. Phlebectomy to further risk stratification with echo and further testing as he sees fit. I have given her prescription for nitroglycerin sublingual tablets to take if she has another episode.

## 2011-08-27 DIAGNOSIS — N879 Dysplasia of cervix uteri, unspecified: Secondary | ICD-10-CM | POA: Insufficient documentation

## 2011-08-27 DIAGNOSIS — E785 Hyperlipidemia, unspecified: Secondary | ICD-10-CM | POA: Insufficient documentation

## 2011-08-27 DIAGNOSIS — E042 Nontoxic multinodular goiter: Secondary | ICD-10-CM | POA: Insufficient documentation

## 2011-08-27 DIAGNOSIS — R87619 Unspecified abnormal cytological findings in specimens from cervix uteri: Secondary | ICD-10-CM | POA: Insufficient documentation

## 2011-08-27 DIAGNOSIS — D509 Iron deficiency anemia, unspecified: Secondary | ICD-10-CM | POA: Insufficient documentation

## 2011-08-27 DIAGNOSIS — E119 Type 2 diabetes mellitus without complications: Secondary | ICD-10-CM | POA: Insufficient documentation

## 2011-09-03 ENCOUNTER — Encounter: Payer: Self-pay | Admitting: *Deleted

## 2011-09-04 ENCOUNTER — Ambulatory Visit (INDEPENDENT_AMBULATORY_CARE_PROVIDER_SITE_OTHER): Payer: Medicare Other | Admitting: Cardiovascular Disease

## 2011-09-04 ENCOUNTER — Encounter: Payer: Self-pay | Admitting: Cardiovascular Disease

## 2011-09-04 VITALS — BP 122/79 | HR 69 | Ht 65.0 in | Wt 153.0 lb

## 2011-09-04 DIAGNOSIS — R609 Edema, unspecified: Secondary | ICD-10-CM

## 2011-09-04 DIAGNOSIS — R079 Chest pain, unspecified: Secondary | ICD-10-CM

## 2011-09-04 DIAGNOSIS — R600 Localized edema: Secondary | ICD-10-CM

## 2011-09-04 DIAGNOSIS — E1169 Type 2 diabetes mellitus with other specified complication: Secondary | ICD-10-CM | POA: Insufficient documentation

## 2011-09-04 DIAGNOSIS — E785 Hyperlipidemia, unspecified: Secondary | ICD-10-CM

## 2011-09-04 DIAGNOSIS — R55 Syncope and collapse: Secondary | ICD-10-CM

## 2011-09-04 MED ORDER — FUROSEMIDE 20 MG PO TABS: 20.0000 mg | ORAL_TABLET | Freq: Every day | ORAL | Status: DC | PRN

## 2011-09-04 NOTE — Assessment & Plan Note (Signed)
No further episodes of syncope since her blood pressure medications were changed. Blood pressure does continue to run low at baseline.

## 2011-09-04 NOTE — Progress Notes (Signed)
Patient ID: Megan Bradley, female    DOB: 1940/11/15, 71 y.o.   MRN: [id number]  HPI Comments: Megan Bradley is a 71 year old woman, patient of Dr. Darrick Huntsman who has had a history of syncope over the past 2 years with at least 4 episodes of uncertain etiology, improved by holding  lisinopril and HCTZ. She presents for routine followup.  She reports having an episode of chest discomfort which she felt was secondary to esophageal spasm. She took 3 pills for her spasm with no relief of symptoms. She had some left foot swelling recently and given both symptoms, she went to the emergency room for further evaluation. Cardiac workup was essentially normal with normal enzymes x2. She had elevated d-dimer and a gait left lower extremity ultrasound with no DVT, chest CT scan showing no PE. Since then she's not had any further episodes of chest pain. He was recommended to her that she have a routine treadmill study.  She reports having cardiac catheterization years ago that showed no significant coronary artery disease. Previous echocardiogram many years ago showed diastolic dysfunction with normal LV function  EKG shows normal sinus rhythm with rate 66 beats per minute with right bundle branch block, no other significant ST or T wave changes    Outpatient Encounter Prescriptions as of 09/04/2011  Medication Sig Dispense Refill  . ALPRAZolam (XANAX) 0.5 MG tablet Take 1 tablet (0.5 mg total) by mouth 2 (two) times daily as needed for sleep.  60 tablet  3  . B Complex-C (SUPER B COMPLEX PO) Take 1 capsule by mouth daily.        . Cholecalciferol (VITAMIN D) 2000 UNITS CAPS Take 1 capsule by mouth daily.        . citalopram (CELEXA) 40 MG tablet Take 1 tablet (40 mg total) by mouth daily.  90 tablet  6  . ezetimibe (ZETIA) 10 MG tablet Take 10 mg by mouth daily.        . hyoscyamine (LEVSIN SL) 0.125 MG SL tablet Place 0.125 mg under the tongue every 4 (four) hours as needed.        Marland Kitchen levothyroxine (SYNTHROID,  LEVOTHROID) 88 MCG tablet Take 88 mcg by mouth daily.        . meclizine (ANTIVERT) 25 MG tablet Take 25 mg by mouth 3 (three) times daily as needed.        . nitroGLYCERIN (NITROSTAT) 0.4 MG SL tablet Place 1 tablet (0.4 mg total) under the tongue every 5 (five) minutes as needed for chest pain.  30 tablet  3  . pantoprazole (PROTONIX) 40 MG tablet Take 1 tablet (40 mg total) by mouth daily.  30 tablet  5    Review of Systems  Constitutional: Negative.   HENT: Negative.   Eyes: Negative.   Respiratory: Negative.   Cardiovascular: Positive for chest pain and leg swelling.  Gastrointestinal: Negative.   Musculoskeletal: Negative.   Skin: Negative.   Neurological: Negative.   Hematological: Negative.   Psychiatric/Behavioral: Negative.   All other systems reviewed and are negative.    BP 122/79  Pulse 69  Ht 5\' 5"  (1.651 m)  Wt 153 lb (69.4 kg)  BMI 25.46 kg/m2   Physical Exam  Nursing note and vitals reviewed. Constitutional: She is oriented to person, place, and time. She appears well-developed and well-nourished.  HENT:  Head: Normocephalic.  Nose: Nose normal.  Mouth/Throat: Oropharynx is clear and moist.  Eyes: Conjunctivae are normal. Pupils are equal, round, and reactive  to light.  Neck: Normal range of motion. Neck supple. No JVD present.  Cardiovascular: Normal rate, regular rhythm, S1 normal, S2 normal, normal heart sounds and intact distal pulses.  Exam reveals no gallop and no friction rub.   No murmur heard. Pulmonary/Chest: Effort normal and breath sounds normal. No respiratory distress. She has no wheezes. She has no rales. She exhibits no tenderness.  Abdominal: Soft. Bowel sounds are normal. She exhibits no distension. There is no tenderness.  Musculoskeletal: Normal range of motion. She exhibits no edema and no tenderness.  Lymphadenopathy:    She has no cervical adenopathy.  Neurological: She is alert and oriented to person, place, and time. Coordination  normal.  Skin: Skin is warm and dry. No rash noted. No erythema.  Psychiatric: She has a normal mood and affect. Her behavior is normal. Judgment and thought content normal.         Assessment and Plan

## 2011-09-04 NOTE — Patient Instructions (Addendum)
You are doing well. No medication changes were made.  We will order a treadmill study for next week for chest pain  Please call us if you have new issues that need to be addressed before your next appt.  Your physician wants you to follow-up in: 12 months.  You will receive a reminder letter in the mail two months in advance. If you don't receive a letter, please call our office to schedule the follow-up appointment.

## 2011-09-04 NOTE — Assessment & Plan Note (Signed)
She does report edema of the left lower extremity. Ultrasound was negative. I suspect it could be secondary to venous insufficiency as she did have previous trauma to the area with twisted ankle though this was years ago. She would like to try a diuretic. We have prescribed Lasix 20 mg to take only as needed for severe edema. I suggested she try TED hose and leg elevation first. If she does try Lasix, she was instructed to take this with a banana or other source of potassium,.

## 2011-09-04 NOTE — Assessment & Plan Note (Signed)
Etiology of her chest pain is uncertain, concerning for spasm. We had suggested she complete a routine treadmill study at her convenience next week.

## 2011-09-04 NOTE — Assessment & Plan Note (Signed)
Currently on zetia. Total cholesterol 181, LDL 95, HDL 79. Labs from March 2013

## 2011-09-07 ENCOUNTER — Encounter: Payer: Self-pay | Admitting: Internal Medicine

## 2011-09-07 DIAGNOSIS — E785 Hyperlipidemia, unspecified: Secondary | ICD-10-CM

## 2011-09-07 NOTE — Assessment & Plan Note (Signed)
Fasting lipids done 09/02/11 at [facility]:  LDL 95 trigs  35  HDL 79. On Zetia.  Prior statin intolerance.  LFTS normal.

## 2011-09-10 ENCOUNTER — Ambulatory Visit (INDEPENDENT_AMBULATORY_CARE_PROVIDER_SITE_OTHER): Payer: Medicare Other | Admitting: Cardiovascular Disease

## 2011-09-10 DIAGNOSIS — R079 Chest pain, unspecified: Secondary | ICD-10-CM

## 2011-09-10 NOTE — Patient Instructions (Signed)
No significant ischemia noted on your treadmill study. We will encourage you to increase your daily exercise. Please call the office if you have worsening shortness of breath or chest pain.

## 2011-09-10 NOTE — Progress Notes (Signed)
Exercise Treadmill Test   Treadmill ordered for recent epsiodes of chest pain.  Resting EKG shows NSR with rate of 61bpm, right bundle-branch block Resting blood pressure of 104/60. Stand bruce protocal was used.  Patient exercised for 3 min 27 sec,  Peak heart rate of 154 bpm.  This was 100% of the maximum predicted heart rate (target heart rate 151). No symptoms of chest pain or lightheadedness were reported at peak stress or in recovery.  Peak Blood pressure recorded was 150/64 Heart rate at 3 minutes in recovery was 89 bpm. No significant ST changes concerning for ischemia  FINAL IMPRESSION: Poor exercise tolerance. Only able to exercise for 3-1/2 minutes on regular Bruce protocol.  No significant EKG changes concerning for ischemia. She did have symptoms of shortness of breath with exertion. We have encouraged her to increase her aerobic activity. She is knowledge is that she does not do any regular exercise

## 2011-09-16 ENCOUNTER — Other Ambulatory Visit: Payer: Self-pay | Admitting: Internal Medicine

## 2011-10-07 ENCOUNTER — Encounter: Payer: Self-pay | Admitting: Cardiovascular Disease

## 2011-10-08 LAB — HM MAMMOGRAPHY: HM Mammogram: NORMAL

## 2011-10-21 ENCOUNTER — Encounter: Payer: Self-pay | Admitting: Cardiovascular Disease

## 2011-11-06 ENCOUNTER — Other Ambulatory Visit: Payer: Self-pay | Admitting: Gynecology

## 2011-11-06 DIAGNOSIS — N63 Unspecified lump in unspecified breast: Secondary | ICD-10-CM

## 2011-11-13 ENCOUNTER — Ambulatory Visit
Admission: RE | Admit: 2011-11-13 | Discharge: 2011-11-13 | Disposition: A | Discharge status: Home / Self Care | Payer: Medicare Other | Source: Ambulatory Visit | Attending: Gynecology | Admitting: Gynecology

## 2011-11-13 DIAGNOSIS — N63 Unspecified lump in unspecified breast: Secondary | ICD-10-CM

## 2011-12-04 ENCOUNTER — Emergency Department: Payer: Self-pay | Admitting: Emergency Medicine

## 2011-12-05 LAB — CBC
HCT: 37.1 % (ref 35.0–47.0)
HGB: 12.3 g/dL (ref 12.0–16.0)
MCH: 31.4 pg (ref 26.0–34.0)
MCHC: 33.2 g/dL (ref 32.0–36.0)
MCV: 95 fL (ref 80–100)
Platelet: 156 10*3/uL (ref 150–440)
RBC: 3.92 10*6/uL (ref 3.80–5.20)
WBC: 8.5 10*3/uL (ref 3.6–11.0)

## 2011-12-05 LAB — PROTIME-INR
INR: 0.9
Prothrombin Time: 12.7 secs (ref 11.5–14.7)

## 2012-01-28 ENCOUNTER — Other Ambulatory Visit: Payer: Self-pay | Admitting: Internal Medicine

## 2012-01-28 MED ORDER — CITALOPRAM HYDROBROMIDE 40 MG PO TABS: 40.0000 mg | ORAL_TABLET | Freq: Every day | ORAL | Status: DC

## 2012-03-02 ENCOUNTER — Ambulatory Visit (INDEPENDENT_AMBULATORY_CARE_PROVIDER_SITE_OTHER): Payer: Medicare Other

## 2012-03-02 DIAGNOSIS — Z23 Encounter for immunization: Secondary | ICD-10-CM

## 2012-04-28 ENCOUNTER — Other Ambulatory Visit: Payer: Self-pay | Admitting: Internal Medicine

## 2012-04-29 NOTE — Telephone Encounter (Signed)
Dr. Darrick Huntsman,  I am routing a controlled substance refill.   Megan Bradley

## 2012-04-30 ENCOUNTER — Other Ambulatory Visit: Payer: Self-pay

## 2012-06-08 ENCOUNTER — Encounter: Payer: Self-pay | Admitting: Internal Medicine

## 2012-06-08 ENCOUNTER — Ambulatory Visit (INDEPENDENT_AMBULATORY_CARE_PROVIDER_SITE_OTHER): Payer: Medicare Other | Admitting: Internal Medicine

## 2012-06-08 VITALS — BP 116/60 | HR 61 | Temp 98.1°F | Resp 12 | Ht 65.5 in | Wt 151.0 lb

## 2012-06-08 DIAGNOSIS — E039 Hypothyroidism, unspecified: Secondary | ICD-10-CM

## 2012-06-08 DIAGNOSIS — R5381 Other malaise: Secondary | ICD-10-CM

## 2012-06-08 DIAGNOSIS — F411 Generalized anxiety disorder: Secondary | ICD-10-CM

## 2012-06-08 DIAGNOSIS — Z1239 Encounter for other screening for malignant neoplasm of breast: Secondary | ICD-10-CM

## 2012-06-08 DIAGNOSIS — R5383 Other fatigue: Secondary | ICD-10-CM

## 2012-06-08 DIAGNOSIS — K219 Gastro-esophageal reflux disease without esophagitis: Secondary | ICD-10-CM

## 2012-06-08 DIAGNOSIS — I10 Essential (primary) hypertension: Secondary | ICD-10-CM

## 2012-06-08 DIAGNOSIS — F419 Anxiety disorder, unspecified: Secondary | ICD-10-CM

## 2012-06-08 MED ORDER — LORAZEPAM 0.5 MG PO TABS: 0.5000 mg | ORAL_TABLET | Freq: Every day | ORAL | Status: DC

## 2012-06-08 NOTE — Patient Instructions (Addendum)
Your goal TSH is 1.0, any lower and you risk a hyperthyroid state which can cause heart failure, osteoporosis and atrial fibrillation  Take your dexilant with your other medications in the morning, it works best on an empty stomach.  If the nausea is not resolved with dexilant,  You can try ativan (lorazepam) instead of alprazolam in the morning to treat anxiety and nausea

## 2012-06-08 NOTE — Progress Notes (Signed)
Patient ID: Megan Bradley, female   DOB: 06/19/1940, 71 y.o.   MRN: [id number]  Patient Active Problem List  Diagnosis  . Dizziness  . Syncope  . HTN (hypertension)  . Hypothyroidism  . Diabetes mellitus  . GERD (gastroesophageal reflux disease)  . Anxiety  . Screening for breast cancer  . Chest pain radiating to jaw  . Hyperlipidemia  . Fatigue    Subjective:  CC:   Chief Complaint  Patient presents with  . Gastrophageal Reflux  . Anxiety    HPI:   Megan Bradley a 71 y.o. female who presents for Nine month followup.  She has multiple issues. Her chief complaint is lack of energy. She states that she is sleeping well she uses Xanax to help her rest. She is averaging 8-9 hours per night. However she wakes up groggy which improves after having her morning coffee. She notices that she is woken frequently by a dry cough. She's also told that she snores RR blade. She has never had testing for sleep apnea.   2) diabetes followup. She does see an endocrinologist for management of her diabetes and her thyroid nodule. She states her blood sugars have been running well controlled. Her  Endocrinologist wants her a1c < 6.0. She is up-to-date on eye exams.  3) Hypothyroidism.  A thyroid biopsy done recently was normal,  ultrasound normal. She asked her endocrinologist to increase her thyroid supplement dose but was told that could not be done. She did not understand why. I have explained to her the risks of overactive thyroid.  4) Reflux.  She is requesting referral for EGD for evaluation of  persistent reflux symptoms despite use of daily Prevacid and nighttime Pepcid.   she has a history of H. pylori infection and gastric polyps which were found on EGD by Dr. wall over 5 years ago.  She has a frequent cough that wakes her up. She notices that she gets hiccups after  even a sip of wine,   she also feels that her food is stopping transiently in her mid esophageal area. Symptoms feels like  impaction but is relieved by drinking Coke which makes her burp. She is taking her Prevacid in the evening because she takes Synthroid and citalopram and other meds  in the morning.   5) uncontrolled anxiety.  she has been using alprazolam chronically , once daily  in the morning, then again at bedtime for management of insomnia .   anxiety is complicated by recurrent nausea. She has a phobia about vomiting. With prolonged discussion today she has revealed that her mother was an alcoholic and would frequently throw up on Sunday mornings after overindulgence every Saturday night.  She recalls  Several older family members telling her as a child that her mother drinks too much.  she has never acknowledged growing up with an alcoholic mother  and wonders if his if psychotherapy would be helpful    Past Medical History  Diagnosis Date  . Vertigo   . Herpes zoster   . Anxiety disorder   . Orthostatic hypotension   . Coronary artery disease   . Hypothyroidism   . Diabetes mellitus   . Anxiety   . GERD (gastroesophageal reflux disease)     Past Surgical History  Procedure Date  . Cardiac catheterization 2003    Dr.Callwood, R/L heart cath,     The following portions of the patient's history were reviewed and updated as appropriate: Allergies, current medications, and problem  list.    Review of Systems:   Patient denies headache, fevers,  unintentional weight loss, skin rash, eye pain, sinus congestion and sinus pain, sore throat,  hemoptysis ,dyspnea, wheezing, chest pain, palpitations, orthopnea, edema, abdominal pain, nausea, melena, diarrhea, constipation, flank pain, dysuria, hematuria, urinary  Frequency, nocturia, numbness, tingling, seizures,  Focal weakness, Loss of consciousness,  Tremor,  Depression, and suicidal ideation.       History   Social History  . Marital Status: Married    Spouse Name: N/A    Number of Children: N/A  . Years of Education: N/A   Occupational  History  . Not on file.   Social History Main Topics  . Smoking status: Never Smoker   . Smokeless tobacco: Never Used  . Alcohol Use: Yes     Comment: wine once a week  . Drug Use: No  . Sexually Active: Not on file   Other Topics Concern  . Not on file   Social History Narrative  . No narrative on file    Objective:  BP 116/60  Pulse 61  Temp 98.1 F (36.7 C) (Oral)  Resp 12  Ht 5' 5.5" (1.664 m)  Wt 151 lb (68.493 kg)  BMI 24.75 kg/m2  SpO2 96%  General appearance: alert, cooperative and appears stated age Ears: normal TM's and external ear canals both ears Throat: lips, mucosa, and tongue normal; teeth and gums normal Neck: no adenopathy, no carotid bruit, supple, symmetrical, trachea midline and thyroid not enlarged, symmetric, no tenderness/mass/nodules Back: symmetric, no curvature. ROM normal. No CVA tenderness. Lungs: clear to auscultation bilaterally Heart: regular rate and rhythm, S1, S2 normal, no murmur, click, rub or gallop Abdomen: soft, non-tender; bowel sounds normal; no masses,  no organomegaly Pulses: 2+ and symmetric Skin: Skin color, texture, turgor normal. No rashes or lesions Lymph nodes: Cervical, supraclavicular, and axillary nodes normal.  Assessment and Plan:  Anxiety Control despite use of moderate dose of citalopram and twice daily alprazolam. Prolonged discussion today reveals that she her mother was an alcoholic who originally status in the morning vomiting after Saturday evenings escapades. I have recommended that she consider entering into psychotherapy to help work this out. If her nausea does not improve with better management of her reflux symptoms I have recommended that we try changing her morning dose of alprazolam to lorazepam  Hypothyroidism Managed by endocrinologist. Prolonged discussion today about the risk of cardiomyopathy and osteoporosis with  increasing her thyroid medication if her TSH is already around 1.0.   GERD  (gastroesophageal reflux disease) With history of H. pylori gastritis, treated, and gastric polyps by last endoscopy over 5 years ago. Suggested that she take her Prevacid in the morning. Trial of Dexilant For a few weeks for see if symptoms resolve.  HTN (hypertension) Well controlled on current medications.  No changes today.  Fatigue She has regular screening for renal disease and anemia by her endocrinologist. Her thyroid function is reportedly normal. Symptoms may be coming from untreated anxiety, deconditioning, or untreated sleep apnea. I've asked her to have her husband watch her sleep pattern if he sees any signs of apneic spells we will schedule her for a sleep study.   Updated Medication List Outpatient Encounter Prescriptions as of 06/08/2012  Medication Sig Dispense Refill  . ALPRAZolam (XANAX) 0.5 MG tablet TAKE ONE TABLET BY MOUTH TWICE DAILY AS NEEDED  60 tablet  3  . B Complex-C (SUPER B COMPLEX PO) Take 1 capsule by mouth daily.        Marland Kitchen  Cholecalciferol (VITAMIN D) 2000 UNITS CAPS Take 1 capsule by mouth daily.        . citalopram (CELEXA) 40 MG tablet Take 1 tablet (40 mg total) by mouth daily.  90 tablet  6  . ezetimibe (ZETIA) 10 MG tablet Take 10 mg by mouth daily.        . furosemide (LASIX) 20 MG tablet Take 1 tablet (20 mg total) by mouth daily as needed.  90 tablet  1  . hyoscyamine (LEVSIN SL) 0.125 MG SL tablet Place 0.125 mg under the tongue every 4 (four) hours as needed.        Marland Kitchen levothyroxine (SYNTHROID, LEVOTHROID) 88 MCG tablet Take 88 mcg by mouth daily.        . meclizine (ANTIVERT) 25 MG tablet Take 25 mg by mouth 3 (three) times daily as needed.        . nitroGLYCERIN (NITROSTAT) 0.4 MG SL tablet Place 1 tablet (0.4 mg total) under the tongue every 5 (five) minutes as needed for chest pain.  30 tablet  3  . pantoprazole (PROTONIX) 40 MG tablet Take 1 tablet (40 mg total) by mouth daily.  30 tablet  5  . LORazepam (ATIVAN) 0.5 MG tablet Take 1 tablet (0.5  mg total) by mouth daily.  30 tablet  1     Orders Placed This Encounter  Procedures  . HM MAMMOGRAPHY  . HM DEXA SCAN  . HM DIABETES FOOT EXAM    No Follow-up on file.

## 2012-06-09 ENCOUNTER — Encounter: Payer: Self-pay | Admitting: Internal Medicine

## 2012-06-09 DIAGNOSIS — T50B95A Adverse effect of other viral vaccines, initial encounter: Secondary | ICD-10-CM | POA: Insufficient documentation

## 2012-06-09 DIAGNOSIS — R5383 Other fatigue: Secondary | ICD-10-CM | POA: Insufficient documentation

## 2012-06-09 NOTE — Assessment & Plan Note (Signed)
Well controlled on current medications.  No changes today. 

## 2012-06-09 NOTE — Assessment & Plan Note (Addendum)
Control despite use of moderate dose of citalopram and twice daily alprazolam. Prolonged discussion today reveals that she her mother was an alcoholic who originally status in the morning vomiting after Saturday evenings escapades. I have recommended that she consider entering into psychotherapy to help work this out. If her nausea does not improve with better management of her reflux symptoms I have recommended that we try changing her morning dose of alprazolam to lorazepam

## 2012-06-09 NOTE — Assessment & Plan Note (Signed)
With history of H. pylori gastritis, treated, and gastric polyps by last endoscopy over 5 years ago. Suggested that she take her Prevacid in the morning. Trial of Dexilant For a few weeks for see if symptoms resolve.

## 2012-06-09 NOTE — Assessment & Plan Note (Signed)
Managed by endocrinologist. Prolonged discussion today about the risk of cardiomyopathy and osteoporosis with  increasing her thyroid medication if her TSH is already around 1.0.

## 2012-06-09 NOTE — Assessment & Plan Note (Signed)
She has regular screening for renal disease and anemia by her endocrinologist. Her thyroid function is reportedly normal. Symptoms may be coming from untreated anxiety, deconditioning, or untreated sleep apnea. I've asked her to have her husband watch her sleep pattern if he sees any signs of apneic spells we will schedule her for a sleep study.

## 2012-08-25 ENCOUNTER — Other Ambulatory Visit: Payer: Self-pay | Admitting: Internal Medicine

## 2012-08-25 NOTE — Telephone Encounter (Signed)
Ok to refill,  Authorized in epic 

## 2012-08-26 NOTE — Telephone Encounter (Signed)
Med phoned in on 3/20.

## 2012-09-16 ENCOUNTER — Telehealth: Payer: Self-pay | Admitting: *Deleted

## 2012-09-16 MED ORDER — OMEPRAZOLE 40 MG PO CPDR: 40.0000 mg | DELAYED_RELEASE_CAPSULE | Freq: Every day | ORAL | Status: DC

## 2012-09-16 NOTE — Telephone Encounter (Signed)
Meds filled

## 2012-09-16 NOTE — Telephone Encounter (Signed)
Refill request  Omeprazole 40 mg  Take 1 by mouth daily

## 2012-10-25 ENCOUNTER — Other Ambulatory Visit: Payer: Self-pay

## 2012-10-25 DIAGNOSIS — Z1231 Encounter for screening mammogram for malignant neoplasm of breast: Secondary | ICD-10-CM

## 2012-11-11 LAB — HM PAP SMEAR: HM Pap smear: NORMAL

## 2012-11-11 LAB — FECAL OCCULT BLOOD, GUAIAC: Fecal Occult Blood: NEGATIVE

## 2012-11-15 ENCOUNTER — Other Ambulatory Visit: Payer: Self-pay

## 2012-11-15 ENCOUNTER — Ambulatory Visit
Admission: RE | Admit: 2012-11-15 | Discharge: 2012-11-15 | Disposition: A | Discharge status: Home / Self Care | Payer: Medicare Other | Source: Ambulatory Visit

## 2012-11-15 DIAGNOSIS — Z1231 Encounter for screening mammogram for malignant neoplasm of breast: Secondary | ICD-10-CM

## 2012-12-23 ENCOUNTER — Telehealth: Payer: Self-pay | Admitting: *Deleted

## 2012-12-23 DIAGNOSIS — F4323 Adjustment disorder with mixed anxiety and depressed mood: Secondary | ICD-10-CM

## 2012-12-23 DIAGNOSIS — E559 Vitamin D deficiency, unspecified: Secondary | ICD-10-CM

## 2012-12-23 DIAGNOSIS — E119 Type 2 diabetes mellitus without complications: Secondary | ICD-10-CM

## 2012-12-23 DIAGNOSIS — E785 Hyperlipidemia, unspecified: Secondary | ICD-10-CM

## 2012-12-23 DIAGNOSIS — E039 Hypothyroidism, unspecified: Secondary | ICD-10-CM

## 2012-12-23 DIAGNOSIS — F411 Generalized anxiety disorder: Secondary | ICD-10-CM

## 2012-12-23 NOTE — Telephone Encounter (Signed)
Patient called requesting refill on lorazepam 0.5 mg last OV 06/08/12  No labs since 09/02/11

## 2012-12-24 MED ORDER — LORAZEPAM 0.5 MG PO TABS: ORAL_TABLET | ORAL | Status: DC

## 2012-12-24 NOTE — Telephone Encounter (Signed)
You may refill for 30 days. Please call patient n have to see her for 6 months per our policy to refill controlled substances. Also she has not had any blood work in over a year. Please have her make an appointment for fasting labs prior to her visit.

## 2012-12-24 NOTE — Telephone Encounter (Signed)
Patient scheduled for OV 01/11/13 and lab scheduled 01/04/13 fasting.

## 2013-01-03 ENCOUNTER — Other Ambulatory Visit (INDEPENDENT_AMBULATORY_CARE_PROVIDER_SITE_OTHER): Payer: Medicare Other

## 2013-01-03 DIAGNOSIS — F4323 Adjustment disorder with mixed anxiety and depressed mood: Secondary | ICD-10-CM

## 2013-01-03 DIAGNOSIS — E039 Hypothyroidism, unspecified: Secondary | ICD-10-CM

## 2013-01-03 DIAGNOSIS — E559 Vitamin D deficiency, unspecified: Secondary | ICD-10-CM

## 2013-01-03 DIAGNOSIS — E119 Type 2 diabetes mellitus without complications: Secondary | ICD-10-CM

## 2013-01-03 LAB — HEMOGLOBIN A1C: Hgb A1c MFr Bld: 6.5 % (ref 4.6–6.5)

## 2013-01-03 LAB — COMPREHENSIVE METABOLIC PANEL
ALT: 17 U/L (ref 0–35)
AST: 18 U/L (ref 0–37)
Alkaline Phosphatase: 42 U/L (ref 39–117)
CO2: 29 mEq/L (ref 19–32)
GFR: 60.26 mL/min (ref >60.00)
Sodium: 142 mEq/L (ref 135–145)
Total Bilirubin: 0.8 mg/dL (ref 0.3–1.2)
Total Protein: 6.2 g/dL (ref 6.0–8.3)

## 2013-01-03 LAB — LIPID PANEL
Total CHOL/HDL Ratio: 2
VLDL: 5.6 mg/dL (ref 0.0–40.0)

## 2013-01-03 LAB — MICROALBUMIN / CREATININE URINE RATIO: Microalb, Ur: 0.4 mg/dL (ref 0.0–1.9)

## 2013-01-04 ENCOUNTER — Other Ambulatory Visit: Payer: Medicare Other

## 2013-01-04 LAB — VITAMIN D 25 HYDROXY (VIT D DEFICIENCY, FRACTURES): Vit D, 25-Hydroxy: 26 ng/mL — ABNORMAL LOW (ref 30–89)

## 2013-01-11 ENCOUNTER — Ambulatory Visit (INDEPENDENT_AMBULATORY_CARE_PROVIDER_SITE_OTHER): Payer: Medicare Other | Admitting: Internal Medicine

## 2013-01-11 ENCOUNTER — Encounter: Payer: Self-pay | Admitting: Internal Medicine

## 2013-01-11 VITALS — BP 108/70 | HR 70 | Temp 98.2°F | Resp 14 | Wt 147.2 lb

## 2013-01-11 DIAGNOSIS — E1149 Type 2 diabetes mellitus with other diabetic neurological complication: Secondary | ICD-10-CM

## 2013-01-11 DIAGNOSIS — E538 Deficiency of other specified B group vitamins: Secondary | ICD-10-CM | POA: Insufficient documentation

## 2013-01-11 DIAGNOSIS — E559 Vitamin D deficiency, unspecified: Secondary | ICD-10-CM | POA: Insufficient documentation

## 2013-01-11 DIAGNOSIS — R5381 Other malaise: Secondary | ICD-10-CM

## 2013-01-11 DIAGNOSIS — R079 Chest pain, unspecified: Secondary | ICD-10-CM

## 2013-01-11 DIAGNOSIS — R5383 Other fatigue: Secondary | ICD-10-CM

## 2013-01-11 DIAGNOSIS — Z862 Personal history of diseases of the blood and blood-forming organs and certain disorders involving the immune mechanism: Secondary | ICD-10-CM

## 2013-01-11 DIAGNOSIS — J342 Deviated nasal septum: Secondary | ICD-10-CM

## 2013-01-11 DIAGNOSIS — E039 Hypothyroidism, unspecified: Secondary | ICD-10-CM

## 2013-01-11 LAB — VITAMIN B12: Vitamin B-12: 516 pg/mL (ref 211–911)

## 2013-01-11 LAB — IRON AND TIBC
TIBC: 279 ug/dL (ref 250–470)
UIBC: 169 ug/dL (ref 125–400)

## 2013-01-11 LAB — CBC WITH DIFFERENTIAL/PLATELET
Basophils Absolute: 0 10*3/uL (ref 0.0–0.1)
Eosinophils Relative: 1.2 % (ref 0.0–5.0)
Lymphocytes Relative: 26.1 % (ref 12.0–46.0)
Monocytes Relative: 6.6 % (ref 3.0–12.0)
Neutrophils Relative %: 65.5 % (ref 43.0–77.0)
Platelets: 173 10*3/uL (ref 150.0–400.0)
RDW: 15.1 % — ABNORMAL HIGH (ref 11.5–14.6)
WBC: 5.8 10*3/uL (ref 4.5–10.5)

## 2013-01-11 LAB — FERRITIN: Ferritin: 152.6 ng/mL (ref 10.0–291.0)

## 2013-01-11 MED ORDER — CYANOCOBALAMIN 1000 MCG/ML IJ SOLN
1000.0000 ug | Freq: Once | INTRAMUSCULAR | Status: AC
  Administered 2013-01-11: 1000 ug via INTRAMUSCULAR

## 2013-01-11 MED ORDER — NITROGLYCERIN 0.4 MG SL SUBL: 0.4000 mg | SUBLINGUAL_TABLET | SUBLINGUAL | Status: DC | PRN

## 2013-01-11 MED ORDER — ERGOCALCIFEROL 1.25 MG (50000 UT) PO CAPS: 50000.0000 [IU] | ORAL_CAPSULE | ORAL | Status: DC

## 2013-01-11 NOTE — Patient Instructions (Addendum)
If supplementing your B12 does not improve your fatigue, and  If all your lbas are normal, we should investigate your fatiguew ith a sleep study

## 2013-01-11 NOTE — Assessment & Plan Note (Signed)
Controlled.  

## 2013-01-11 NOTE — Assessment & Plan Note (Signed)
TSH was normal.

## 2013-01-11 NOTE — Assessment & Plan Note (Signed)
She has a history of b12 deficiency . Repeat levels and supplementation discussed.

## 2013-01-11 NOTE — Assessment & Plan Note (Signed)
Normal stress test

## 2013-01-11 NOTE — Progress Notes (Signed)
Patient ID: Megan Bradley, female   DOB: [birth year], 72 y.o.   MRN: [id number]  Patient Active Problem List   Diagnosis Date Noted  . History of pernicious anemia 01/11/2013  . Other malaise and fatigue 01/11/2013  . Hypovitaminosis D 01/11/2013  . Deviated septum 01/11/2013  . Fatigue 06/09/2012  . Hyperlipidemia 09/04/2011  . Chest pain radiating to jaw 08/25/2011  . Screening for breast cancer 03/13/2011  . Hypothyroidism   . Type II or unspecified type diabetes mellitus with neurological manifestations, not stated as uncontrolled(250.60)   . GERD (gastroesophageal reflux disease)   . Anxiety   . Dizziness 09/12/2010  . Syncope 09/12/2010  . HTN (hypertension) 09/12/2010    Subjective:  CC:   Chief Complaint  Patient presents with  . Follow-up    6 month for medication    HPI:   Megan Bradley a 72 y.o. female who presents with Fatigue. Symptoms have been present  24/7 fpr the past 2 months.  does not feelit is due ot her condition of depression being uncontrolled because she does not have remores,e sadness or anhedonia,  She simply has no energy and is only able to finish housecleaning because she is compulsive about a clean house. She spent last week at the beach,  No diferrent,  Walked bc the dog was with her. Her husband and daughters have noted loud snoring at night but husband snores worse,. Wakes up tired.     Past Medical History  Diagnosis Date  . Vertigo   . Herpes zoster   . Anxiety disorder   . Orthostatic hypotension   . Coronary artery disease   . Hypothyroidism   . Diabetes mellitus   . Anxiety   . GERD (gastroesophageal reflux disease)     Past Surgical History  Procedure Laterality Date  . Cardiac catheterization  2003    Dr.Callwood, R/L heart cath,       The following portions of the patient's history were reviewed and updated as appropriate: Allergies, current medications, and problem list.    Review of Systems:   12 Pt  review of  systems was negative except those addressed in the HPI,     History   Social History  . Marital Status: Married    Spouse Name: N/A    Number of Children: N/A  . Years of Education: N/A   Occupational History  . Not on file.   Social History Main Topics  . Smoking status: Never Smoker   . Smokeless tobacco: Never Used  . Alcohol Use: Yes     Comment: wine once a week  . Drug Use: No  . Sexually Active: Not on file   Other Topics Concern  . Not on file   Social History Narrative  . No narrative on file    Objective:  BP 108/70  Pulse 70  Temp(Src) 98.2 F (36.8 C) (Oral)  Resp 14  Wt 147 lb 4 oz (66.792 kg)  BMI 24.12 kg/m2  SpO2 99%  General appearance: alert, cooperative and appears stated age Ears: normal TM's and external ear canals both ears Throat: lips, mucosa, and tongue normal; teeth and gums normal Neck: no adenopathy, no carotid bruit, supple, symmetrical, trachea midline and thyroid not enlarged, symmetric, no tenderness/mass/nodules Back: symmetric, no curvature. ROM normal. No CVA tenderness. Lungs: clear to auscultation bilaterally Heart: regular rate and rhythm, S1, S2 normal, no murmur, click, rub or gallop Abdomen: soft, non-tender; bowel sounds normal; no masses,  no  organomegaly Pulses: 2+ and symmetric Skin: Skin color, texture, turgor normal. No rashes or lesions Lymph nodes: Cervical, supraclavicular, and axillary nodes normal.  Assessment and Plan:  Chest pain radiating to jaw Normal stress test   Type II or unspecified type diabetes mellitus with neurological manifestations, not stated as uncontrolled(250.60) Controlled.   Hypothyroidism TSH was normal  Fatigue Ruling out CKD, anemia, thyroid, B12 deficeincy befor considering OSA as cause of symptoms.   History of pernicious anemia She has a history of b12 deficiency . Repeat levels and supplementation discussed.     Updated Medication List Outpatient Encounter  Prescriptions as of 01/11/2013  Medication Sig Dispense Refill  . B Complex-C (SUPER B COMPLEX PO) Take 1 capsule by mouth daily.        . Cholecalciferol (VITAMIN D) 2000 UNITS CAPS Take 1 capsule by mouth daily.        . citalopram (CELEXA) 40 MG tablet Take 1 tablet (40 mg total) by mouth daily.  90 tablet  6  . ezetimibe (ZETIA) 10 MG tablet Take 10 mg by mouth daily.        Marland Kitchen levothyroxine (SYNTHROID, LEVOTHROID) 88 MCG tablet Take 88 mcg by mouth daily.        Marland Kitchen LORazepam (ATIVAN) 0.5 MG tablet TAKE ONE TABLET BY MOUTH EVERY DAY  30 tablet  0  . meclizine (ANTIVERT) 25 MG tablet Take 25 mg by mouth 3 (three) times daily as needed.        Marland Kitchen omeprazole (PRILOSEC) 40 MG capsule Take 1 capsule (40 mg total) by mouth daily.  90 capsule  2  . ALPRAZolam (XANAX) 0.5 MG tablet TAKE ONE TABLET BY MOUTH TWICE DAILY AS NEEDED  60 tablet  3  . ergocalciferol (DRISDOL) 50000 UNITS capsule Take 1 capsule (50,000 Units total) by mouth once a week.  12 capsule  1  . furosemide (LASIX) 20 MG tablet Take 1 tablet (20 mg total) by mouth daily as needed.  90 tablet  1  . hyoscyamine (LEVSIN SL) 0.125 MG SL tablet Place 0.125 mg under the tongue every 4 (four) hours as needed.        . nitroGLYCERIN (NITROSTAT) 0.4 MG SL tablet Place 1 tablet (0.4 mg total) under the tongue every 5 (five) minutes as needed for chest pain.  50 tablet  3  . pantoprazole (PROTONIX) 40 MG tablet Take 1 tablet (40 mg total) by mouth daily.  30 tablet  5  . [EXPIRED] cyanocobalamin ((VITAMIN B-12)) injection 1,000 mcg        No facility-administered encounter medications on file as of 01/11/2013.     Orders Placed This Encounter  Procedures  . HM MAMMOGRAPHY  . HM DEXA SCAN  . HM PAP SMEAR  . Fecal Occult Blood, Guaiac  . Vitamin B12  . CBC with Differential  . Folate RBC  . Ferritin  . Iron and TIBC  . HM COLONOSCOPY    No Follow-up on file.

## 2013-01-11 NOTE — Assessment & Plan Note (Signed)
Ruling out CKD, anemia, thyroid, B12 deficeincy befor considering OSA as cause of symptoms.

## 2013-01-12 LAB — FOLATE RBC: RBC Folate: 933 ng/mL (ref >=366)

## 2013-01-13 ENCOUNTER — Encounter: Payer: Self-pay | Admitting: *Deleted

## 2013-01-18 MED ORDER — SYRINGE (DISPOSABLE) 1 ML MISC: Status: DC

## 2013-01-18 MED ORDER — CYANOCOBALAMIN 1000 MCG/ML IJ SOLN: 1000.0000 ug | Freq: Once | INTRAMUSCULAR | Status: DC

## 2013-01-18 NOTE — Addendum Note (Signed)
Addended by: Sherlene Shams on: 01/18/2013 10:12 AM   Modules accepted: Orders

## 2013-01-21 ENCOUNTER — Other Ambulatory Visit: Payer: Self-pay | Admitting: *Deleted

## 2013-01-21 ENCOUNTER — Encounter: Payer: Self-pay | Admitting: Internal Medicine

## 2013-01-21 DIAGNOSIS — F411 Generalized anxiety disorder: Secondary | ICD-10-CM

## 2013-01-21 NOTE — Telephone Encounter (Signed)
Please advise 

## 2013-01-23 MED ORDER — LORAZEPAM 0.5 MG PO TABS: ORAL_TABLET | ORAL | Status: DC

## 2013-02-08 ENCOUNTER — Ambulatory Visit: Payer: Medicare Other | Admitting: *Deleted

## 2013-02-08 ENCOUNTER — Telehealth: Payer: Self-pay | Admitting: Internal Medicine

## 2013-02-08 DIAGNOSIS — D519 Vitamin B12 deficiency anemia, unspecified: Secondary | ICD-10-CM

## 2013-02-08 MED ORDER — CYANOCOBALAMIN 1000 MCG/ML IJ SOLN
1000.0000 ug | Freq: Once | INTRAMUSCULAR | Status: AC
  Administered 2013-02-08: 1000 ug via INTRAMUSCULAR

## 2013-02-08 NOTE — Telephone Encounter (Signed)
Pt stated she was to take vit d for 3 months she has taken this for 2 weeks.  Pt wanted to know if she needed to come back to see dr Darrick Huntsman after the 3 months

## 2013-02-08 NOTE — Telephone Encounter (Signed)
Last office visit states to follow up in 6 months please advise if needs to be seen or labs sooner

## 2013-02-09 NOTE — Telephone Encounter (Signed)
Finish the vit d mega dose, then switch to 2000 units of Vit D3 otc.  Will no repeat level needed at this time

## 2013-02-09 NOTE — Telephone Encounter (Signed)
Left message for pt with advice. 

## 2013-02-25 ENCOUNTER — Other Ambulatory Visit: Payer: Self-pay | Admitting: *Deleted

## 2013-02-25 DIAGNOSIS — F411 Generalized anxiety disorder: Secondary | ICD-10-CM

## 2013-02-25 MED ORDER — LORAZEPAM 0.5 MG PO TABS: ORAL_TABLET | ORAL | Status: DC

## 2013-03-01 ENCOUNTER — Telehealth: Payer: Self-pay | Admitting: Internal Medicine

## 2013-03-01 NOTE — Telephone Encounter (Signed)
Script faxed to pharmacy and patient notified.

## 2013-03-01 NOTE — Telephone Encounter (Signed)
Pt checking status of Ativan.  Says her pharmacy has sent request.  Pt only has 1 left and is not supposed to miss a dose.

## 2013-03-14 ENCOUNTER — Telehealth: Payer: Self-pay | Admitting: Internal Medicine

## 2013-03-14 NOTE — Telephone Encounter (Signed)
Pt left vm.  States [facility] mail sent request for citalopram for approval.  Pt is out of refills.  Pt asking for call.

## 2013-03-15 MED ORDER — CITALOPRAM HYDROBROMIDE 40 MG PO TABS: 40.0000 mg | ORAL_TABLET | Freq: Every day | ORAL | Status: DC

## 2013-03-15 NOTE — Telephone Encounter (Signed)
Patient refill sent electronically verbally ok by Dr. Darrick Huntsman.

## 2013-03-17 LAB — HM DIABETES EYE EXAM

## 2013-03-29 ENCOUNTER — Telehealth: Payer: Self-pay | Admitting: *Deleted

## 2013-03-29 DIAGNOSIS — F411 Generalized anxiety disorder: Secondary | ICD-10-CM

## 2013-03-29 MED ORDER — LORAZEPAM 0.5 MG PO TABS: ORAL_TABLET | ORAL | Status: DC

## 2013-03-29 NOTE — Telephone Encounter (Signed)
Script faxed.

## 2013-03-29 NOTE — Telephone Encounter (Signed)
Ok to refill,  printed rx  

## 2013-03-29 NOTE — Telephone Encounter (Signed)
[facility] sent refill request for Lorazepam 0.5mg  tab last fill 03/01/13 please advise as to refill.

## 2013-03-30 ENCOUNTER — Ambulatory Visit (INDEPENDENT_AMBULATORY_CARE_PROVIDER_SITE_OTHER): Payer: Medicare Other

## 2013-03-30 DIAGNOSIS — Z23 Encounter for immunization: Secondary | ICD-10-CM

## 2013-05-24 ENCOUNTER — Telehealth: Payer: Self-pay | Admitting: Internal Medicine

## 2013-05-24 NOTE — Telephone Encounter (Signed)
Fell Saturday night and has badly bruised ribs.  Worst pain she has ever had and has purple bruises.  Was seen in [facility] ER and given Tramadol, but she does not want to take the Tramadol.  Asking how often she can take ibuprofen.  Asking if she does take the Tramadol can she take anything else with it for the pain.

## 2013-05-24 NOTE — Telephone Encounter (Signed)
Left message pt to call back

## 2013-05-24 NOTE — Telephone Encounter (Signed)
Yes, she can combine tramadol 50 mg every 6 hours and ibuprofen 800 mg every 8 hours.

## 2013-05-26 ENCOUNTER — Emergency Department: Payer: Self-pay | Admitting: Emergency Medicine

## 2013-05-26 NOTE — Telephone Encounter (Signed)
fyi

## 2013-05-26 NOTE — Telephone Encounter (Signed)
Patient Information:  Caller Name: Denessa  Phone: [phone]  Patient: Megan Bradley, Megan Bradley  Gender: Female  DOB: March 11, 1941  Age: 72 Years  PCP: Duncan Dull (Adults only)  Office Follow Up:  Does the office need to follow up with this patient?: No  Instructions For The Office: N/A  RN Note:  FBS 103 at 0700. BP 148/90 at 0930. Severe rib pain rated 12/10 with movement or sitting.  Has 3 lemon-sized bruises around left ribs. Using Tramadol every 6 hours and Ibuprofen every 8 hours without improvement.  Call office nurse/Sonia now per MD orders for ED disposition when office is open for severe chest pain. Advised to go to [facility] ED now.  Symptoms  Reason For Call & Symptoms: Ongoing, severe, worsening, shooting pain with bruising  left side after fell steps; landed on seat at theater.  Seen at  [facility] ED.  No fractures seen on xray.  ED MD concered about spleen injury.  Sono showed no fluid in abdomen.   Diagnosed with severe contusion to ribs.  Reports significant pain with moving, coughing or sneezing.  Is there stronger pain medication than Tramadol?  Reviewed Health History In EMR: Yes  Reviewed Medications In EMR: Yes  Reviewed Allergies In EMR: Yes  Reviewed Surgeries / Procedures: Yes  Date of Onset of Symptoms: 05/22/2013  Treatments Tried: Tramadol, Ibuprofen  Treatments Tried Worked: No  Guideline(s) Used:  Chest Injury  Disposition Per Guideline:   Go to ED Now  Reason For Disposition Reached:   Severe chest pain  Advice Given:  Apply a Cold Pack:  Apply a cold pack or an ice bag (wrapped in a moist towel) to the area for 20 minutes. Repeat in 1 hour, then every 4 hours while awake.  Continue this for the first 48 hours after an injury (Reason: to reduce the swelling and pain).  Apply Heat to the Area:  Beginning 48 hours after an injury, apply a warm washcloth or heating pad for 10 minutes three times a day.  This will help increase blood flow and improve  healing.  Limit Activities:  Avoid strenuous activities or contact sports until the pain is gone.  Call Back If:  Swelling or bruise becomes over 2 inches (5 cm).  Pain not improved after 72 hours  You become worse.  Patient Will Follow Care Advice:  YES

## 2013-05-30 ENCOUNTER — Ambulatory Visit (INDEPENDENT_AMBULATORY_CARE_PROVIDER_SITE_OTHER): Payer: Medicare Other | Admitting: Internal Medicine

## 2013-05-30 ENCOUNTER — Encounter: Payer: Self-pay | Admitting: Internal Medicine

## 2013-05-30 VITALS — BP 120/70 | HR 69 | Temp 98.1°F | Wt 156.0 lb

## 2013-05-30 DIAGNOSIS — S2249XA Multiple fractures of ribs, unspecified side, initial encounter for closed fracture: Secondary | ICD-10-CM | POA: Insufficient documentation

## 2013-05-30 DIAGNOSIS — S2242XS Multiple fractures of ribs, left side, sequela: Secondary | ICD-10-CM

## 2013-05-30 DIAGNOSIS — IMO0002 Reserved for concepts with insufficient information to code with codable children: Secondary | ICD-10-CM

## 2013-05-30 DIAGNOSIS — R269 Unspecified abnormalities of gait and mobility: Secondary | ICD-10-CM

## 2013-05-30 MED ORDER — HYDROCODONE-ACETAMINOPHEN 5-325 MG PO TABS: 1.0000 | ORAL_TABLET | Freq: Three times a day (TID) | ORAL | Status: DC | PRN

## 2013-05-30 NOTE — Telephone Encounter (Signed)
Needs to be seen and evaluated if severe pain requiring refill on narcotics.

## 2013-05-30 NOTE — Telephone Encounter (Signed)
Patient is coming in today at 4. °

## 2013-05-30 NOTE — Telephone Encounter (Signed)
Pt calling regarding fall noted previously.  Pt states she went to ER as recommended by triage.  States she was prescribed hydrocodone/acetaminophen and is asking for a refill.  States she will be out tomorrow and she is still having severe pain from 2 fractured ribs.

## 2013-05-30 NOTE — Assessment & Plan Note (Signed)
Recent left-sided rib fracture after a fall. Reviewed notes from both [facility] and [facility] today. Will continue hydrocodone as needed for severe pain and Lidoderm patch. Encouraged her to continue incentive spirometry. She will call if any worsening shortness of breath, cough, or pain. We discussed that this will likely take several months to heal.

## 2013-05-30 NOTE — Progress Notes (Signed)
Subjective:    Patient ID: Megan Bradley, female    DOB: 1940-06-14, 72 y.o.   MRN: [id number]  HPI 72YO female presents for acute visit complaining of left chest wall pain. Last Sunday, fell on steps at [facility] in [place]. [place] forward, hitting left ribs on aisle seat. Initially short of breath. Went to [facility], had chest xray which was normal. Also had [place] of spleen, which was normal. Had blood work which was normal. Treated with Tramadol and discharged home. Sunday and Monday had severe pain, not improved with Tramadol or Advil. Called nurse triage, went to [facility] ED, had repeat CXR which showed fracture left 7th-9th ribs.  Treated with Hydrocodone and Lidocaine patch, with some improvement. Now taking 1 hydrocodone every 8 hours. Notes severe, stabbing pain in left chest wall as medication wears off. Having trouble performing ADLs and sleeping.  Also concerned about gait instability which led to fall. Notes that she has had several falls over the last couple of years. This is the most severe injury she has had, however. She denies focal weakness in her legs. Each fall has been caused by tripping over a low-lying step. She is interested in potentially setting up physical therapy for fall prevention in the future.  Outpatient Encounter Prescriptions as of 05/30/2013  Medication Sig  . citalopram (CELEXA) 40 MG tablet Take 1 tablet (40 mg total) by mouth daily.  . cyanocobalamin (,VITAMIN B-12,) 1000 MCG/ML injection Inject 1 mL (1,000 mcg total) into the muscle once. I ml IM injection monthly  . ergocalciferol (DRISDOL) 50000 UNITS capsule Take 1 capsule (50,000 Units total) by mouth once a week.  . ezetimibe (ZETIA) 10 MG tablet Take 10 mg by mouth daily.    Marland Kitchen HYDROcodone-acetaminophen (NORCO/VICODIN) 5-325 MG per tablet Take 1 tablet by mouth every 8 (eight) hours.  . hyoscyamine (LEVSIN SL) 0.125 MG SL tablet Place 0.125 mg under the tongue every 4 (four) hours as needed.    Marland Kitchen levothyroxine  (SYNTHROID, LEVOTHROID) 88 MCG tablet Take 88 mcg by mouth daily.    Marland Kitchen lidocaine (LIDODERM) 5 % Place 1 patch onto the skin daily. Remove & Discard patch within 12 hours or as directed by MD  . LORazepam (ATIVAN) 0.5 MG tablet TAKE ONE TABLET BY MOUTH EVERY DAY  . meclizine (ANTIVERT) 25 MG tablet Take 25 mg by mouth 3 (three) times daily as needed.    . nitroGLYCERIN (NITROSTAT) 0.4 MG SL tablet Place 1 tablet (0.4 mg total) under the tongue every 5 (five) minutes as needed for chest pain.  Marland Kitchen omeprazole (PRILOSEC) 40 MG capsule Take 1 capsule (40 mg total) by mouth daily.  . Syringe, Disposable, 1 ML MISC For b12 injections  . ALPRAZolam (XANAX) 0.5 MG tablet TAKE ONE TABLET BY MOUTH TWICE DAILY AS NEEDED  . B Complex-C (SUPER B COMPLEX PO) Take 1 capsule by mouth daily.    . Cholecalciferol (VITAMIN D) 2000 UNITS CAPS Take 1 capsule by mouth daily.    . furosemide (LASIX) 20 MG tablet Take 1 tablet (20 mg total) by mouth daily as needed.  . nitroGLYCERIN (NITROSTAT) 0.4 MG SL tablet Place 1 tablet (0.4 mg total) under the tongue every 5 (five) minutes as needed for chest pain.  . pantoprazole (PROTONIX) 40 MG tablet Take 1 tablet (40 mg total) by mouth daily.   BP 120/70  Pulse 69  Temp(Src) 98.1 F (36.7 C) (Oral)  Wt 156 lb (70.761 kg)  SpO2 96%   Review  of Systems  Constitutional: Negative for fever, chills, appetite change, fatigue and unexpected weight change.  HENT: Negative for congestion, ear pain, sinus pressure, sore throat, trouble swallowing and voice change.   Eyes: Negative for visual disturbance.  Respiratory: Negative for cough, shortness of breath, wheezing and stridor.   Cardiovascular: Positive for chest pain. Negative for palpitations and leg swelling.  Gastrointestinal: Negative for nausea, vomiting, abdominal pain, diarrhea, constipation, blood in stool, abdominal distention and anal bleeding.  Genitourinary: Negative for dysuria and flank pain.  Musculoskeletal:  Negative for arthralgias, gait problem, myalgias and neck pain.  Skin: Negative for color change and rash.  Neurological: Negative for dizziness and headaches.  Hematological: Negative for adenopathy. Does not bruise/bleed easily.  Psychiatric/Behavioral: Negative for suicidal ideas, sleep disturbance and dysphoric mood. The patient is not nervous/anxious.        Objective:   Physical Exam  Constitutional: She is oriented to person, place, and time. She appears well-developed and well-nourished. No distress.  HENT:  Head: Normocephalic and atraumatic.  Right Ear: External ear normal.  Left Ear: External ear normal.  Nose: Nose normal.  Mouth/Throat: Oropharynx is clear and moist. No oropharyngeal exudate.  Eyes: Conjunctivae are normal. Pupils are equal, round, and reactive to light. Right eye exhibits no discharge. Left eye exhibits no discharge. No scleral icterus.  Neck: Normal range of motion. Neck supple. No tracheal deviation present. No thyromegaly present.  Cardiovascular: Normal rate, regular rhythm, normal heart sounds and intact distal pulses.  Exam reveals no gallop and no friction rub.   No murmur heard. Pulmonary/Chest: Effort normal and breath sounds normal. No accessory muscle usage. Not tachypneic. No respiratory distress. She has no decreased breath sounds. She has no wheezes. She has no rhonchi. She has no rales. She exhibits no tenderness.    Musculoskeletal: Normal range of motion. She exhibits no edema and no tenderness.  Lymphadenopathy:    She has no cervical adenopathy.  Neurological: She is alert and oriented to person, place, and time. No cranial nerve deficit. She exhibits normal muscle tone. Coordination normal.  Skin: Skin is warm and dry. No rash noted. She is not diaphoretic. No erythema. No pallor.  Psychiatric: She has a normal mood and affect. Her behavior is normal. Judgment and thought content normal.          Assessment & Plan:

## 2013-05-30 NOTE — Assessment & Plan Note (Signed)
Patient notes recent gait instability. Discussed potentially starting physical therapy after she heals from acute injury. Will set up a followup with her primary care physician to discuss further.

## 2013-05-30 NOTE — Progress Notes (Signed)
Pre-visit discussion using our clinic review tool. No additional management support is needed unless otherwise documented below in the visit note.  

## 2013-06-06 ENCOUNTER — Other Ambulatory Visit: Payer: Self-pay | Admitting: *Deleted

## 2013-06-06 MED ORDER — OMEPRAZOLE 40 MG PO CPDR: 40.0000 mg | DELAYED_RELEASE_CAPSULE | Freq: Every day | ORAL | Status: DC

## 2013-06-14 ENCOUNTER — Ambulatory Visit (INDEPENDENT_AMBULATORY_CARE_PROVIDER_SITE_OTHER): Payer: Medicare Other | Admitting: Internal Medicine

## 2013-06-14 ENCOUNTER — Encounter: Payer: Self-pay | Admitting: Internal Medicine

## 2013-06-14 VITALS — BP 128/68 | HR 73 | Temp 97.6°F | Resp 12 | Wt 151.5 lb

## 2013-06-14 DIAGNOSIS — S2242XA Multiple fractures of ribs, left side, initial encounter for closed fracture: Secondary | ICD-10-CM

## 2013-06-14 DIAGNOSIS — S2249XA Multiple fractures of ribs, unspecified side, initial encounter for closed fracture: Secondary | ICD-10-CM

## 2013-06-14 DIAGNOSIS — R2689 Other abnormalities of gait and mobility: Secondary | ICD-10-CM

## 2013-06-14 DIAGNOSIS — R5381 Other malaise: Secondary | ICD-10-CM

## 2013-06-14 DIAGNOSIS — R29818 Other symptoms and signs involving the nervous system: Secondary | ICD-10-CM

## 2013-06-14 DIAGNOSIS — R42 Dizziness and giddiness: Secondary | ICD-10-CM

## 2013-06-14 DIAGNOSIS — R5383 Other fatigue: Secondary | ICD-10-CM

## 2013-06-14 NOTE — Patient Instructions (Signed)
Physical therapy referral to [facility] in process  I agree with second opinion on vision  If the fatigue does not improve with activity, we need to evaluate your sleep quality with a sleep sutudy

## 2013-06-14 NOTE — Progress Notes (Signed)
Pre-visit discussion using our clinic review tool. No additional management support is needed unless otherwise documented below in the visit note.  

## 2013-06-14 NOTE — Progress Notes (Signed)
Patient ID: Megan Bradley, female   DOB: [birth year], 73 y.o.   MRN: [id number]  Patient Active Problem List   Diagnosis Date Noted  . Fracture of multiple ribs 05/30/2013  . Gait disturbance 05/30/2013  . History of pernicious anemia 01/11/2013  . Other malaise and fatigue 01/11/2013  . Hypovitaminosis D 01/11/2013  . Deviated septum 01/11/2013  . Fatigue 06/09/2012  . Hyperlipidemia 09/04/2011  . Chest pain radiating to jaw 08/25/2011  . Screening for breast cancer 03/13/2011  . Hypothyroidism   . Type II or unspecified type diabetes mellitus with neurological manifestations, not stated as uncontrolled(250.60)   . GERD (gastroesophageal reflux disease)   . Anxiety   . Dizziness 09/12/2010  . Syncope 09/12/2010  . HTN (hypertension) 09/12/2010    Subjective:  CC:   Chief Complaint  Patient presents with  . Follow-up    2 week rib fracture    HPI:   Megan Bradley a 74 y.o. female who presents Follow up on multiple left sided  rib fractures, traumatic.  Occurred while descending the stairway at [facility] during Christmas showing of the [employer].  Strucjk LUQ on arm rest of chair as she fell. Initial films and ultrsound at [facility] were normal, but persistent pain and repeat evaluation days later showed left sided fractures of ribs 9 and 10.  Pain improving,  Using incentive spirometry and no narcotics today.  Soreness with sudde movement .      Past Medical History  Diagnosis Date  . Vertigo   . Herpes zoster   . Anxiety disorder   . Orthostatic hypotension   . Coronary artery disease   . Hypothyroidism   . Diabetes mellitus   . Anxiety   . GERD (gastroesophageal reflux disease)     Past Surgical History  Procedure Laterality Date  . Cardiac catheterization  2003    Dr.Callwood, R/L heart cath,       The following portions of the patient's history were reviewed and updated as appropriate: Allergies, current medications, and problem list.    Review of  Systems:   12 Pt  review of systems was negative except those addressed in the HPI,     History   Social History  . Marital Status: Married    Spouse Name: N/A    Number of Children: N/A  . Years of Education: N/A   Occupational History  . Not on file.   Social History Main Topics  . Smoking status: Never Smoker   . Smokeless tobacco: Never Used  . Alcohol Use: Yes     Comment: wine once a week  . Drug Use: No  . Sexual Activity: Not on file   Other Topics Concern  . Not on file   Social History Narrative  . No narrative on file    Objective:  Filed Vitals:   06/14/13 1610  BP: 128/68  Pulse: 73  Temp: 97.6 F (36.4 C)  Resp: 12     General appearance: alert, cooperative and appears stated age Ears: normal TM's and external ear canals both ears Throat: lips, mucosa, and tongue normal; teeth and gums normal Neck: no adenopathy, no carotid bruit, supple, symmetrical, trachea midline and thyroid not enlarged, symmetric, no tenderness/mass/nodules Back: symmetric, no curvature. ROM normal. Some tenderness. Left side over ribs ,  No bruising Lungs: clear to auscultation bilaterally Heart: regular rate and rhythm, S1, S2 normal, no murmur, click, rub or gallop Abdomen: soft, non-tender; bowel sounds normal; no masses,  no organomegaly Pulses: 2+ and symmetric Skin: Skin color, texture, turgor normal. No rashes or lesions Lymph nodes: Cervical, supraclavicular, and axillary nodes normal.  Assessment and Plan:  Fracture of multiple ribs Reviewed reports from [facility].  Ribs 9 and 10 .  Nondisplaced.  Pain improving  Lungs sound normal  Dizziness Refer to PT for balance disorder and frequent falls. recommended having vision checked again as well.   Fatigue Advised her to have sleep study given history of snoring, fatigue. Wants to try PR first   Updated Medication List Outpatient Encounter Prescriptions as of 06/14/2013  Medication Sig  . citalopram (CELEXA)  40 MG tablet Take 1 tablet (40 mg total) by mouth daily.  . cyanocobalamin (,VITAMIN B-12,) 1000 MCG/ML injection Inject 1 mL (1,000 mcg total) into the muscle once. I ml IM injection monthly  . ergocalciferol (DRISDOL) 50000 UNITS capsule Take 1 capsule (50,000 Units total) by mouth once a week.  . ezetimibe (ZETIA) 10 MG tablet Take 10 mg by mouth daily.    Marland Kitchen levothyroxine (SYNTHROID, LEVOTHROID) 88 MCG tablet Take 88 mcg by mouth daily.    Marland Kitchen LORazepam (ATIVAN) 0.5 MG tablet TAKE ONE TABLET BY MOUTH EVERY DAY  . meclizine (ANTIVERT) 25 MG tablet Take 25 mg by mouth 3 (three) times daily as needed.    . nitroGLYCERIN (NITROSTAT) 0.4 MG SL tablet Place 1 tablet (0.4 mg total) under the tongue every 5 (five) minutes as needed for chest pain.  Marland Kitchen omeprazole (PRILOSEC) 40 MG capsule Take 1 capsule (40 mg total) by mouth daily.  . Syringe, Disposable, 1 ML MISC For b12 injections  . ALPRAZolam (XANAX) 0.5 MG tablet TAKE ONE TABLET BY MOUTH TWICE DAILY AS NEEDED  . B Complex-C (SUPER B COMPLEX PO) Take 1 capsule by mouth daily.    . Cholecalciferol (VITAMIN D) 2000 UNITS CAPS Take 1 capsule by mouth daily.    . furosemide (LASIX) 20 MG tablet Take 1 tablet (20 mg total) by mouth daily as needed.  Marland Kitchen HYDROcodone-acetaminophen (NORCO/VICODIN) 5-325 MG per tablet Take 1-2 tablets by mouth 3 (three) times daily as needed for moderate pain.  . hyoscyamine (LEVSIN SL) 0.125 MG SL tablet Place 0.125 mg under the tongue every 4 (four) hours as needed.    . lidocaine (LIDODERM) 5 % Place 1 patch onto the skin daily. Remove & Discard patch within 12 hours or as directed by MD  . nitroGLYCERIN (NITROSTAT) 0.4 MG SL tablet Place 1 tablet (0.4 mg total) under the tongue every 5 (five) minutes as needed for chest pain.  . pantoprazole (PROTONIX) 40 MG tablet Take 1 tablet (40 mg total) by mouth daily.     Orders Placed This Encounter  Procedures  . Ambulatory referral to Physical Therapy    Return in about 4  weeks (around 07/12/2013).

## 2013-06-15 NOTE — Assessment & Plan Note (Signed)
Advised her to have sleep study given history of snoring, fatigue. Wants to try PR first

## 2013-06-15 NOTE — Assessment & Plan Note (Signed)
Refer to PT for balance disorder and frequent falls. recommended having vision checked again as well.

## 2013-06-15 NOTE — Assessment & Plan Note (Signed)
Reviewed reports from [facility].  Ribs 9 and 10 .  Nondisplaced.  Pain improving  Lungs sound normal

## 2013-06-22 ENCOUNTER — Encounter: Payer: Self-pay | Admitting: Internal Medicine

## 2013-06-23 ENCOUNTER — Other Ambulatory Visit: Payer: Self-pay | Admitting: Internal Medicine

## 2013-06-23 NOTE — Telephone Encounter (Signed)
Ok to refill,  Refill sent  

## 2013-06-23 NOTE — Telephone Encounter (Signed)
Refill

## 2013-07-10 ENCOUNTER — Encounter: Payer: Self-pay | Admitting: Internal Medicine

## 2013-08-07 ENCOUNTER — Encounter: Payer: Self-pay | Admitting: Internal Medicine

## 2013-08-29 ENCOUNTER — Other Ambulatory Visit: Payer: Self-pay | Admitting: *Deleted

## 2013-08-29 MED ORDER — MECLIZINE HCL 25 MG PO TABS: 25.0000 mg | ORAL_TABLET | Freq: Three times a day (TID) | ORAL | Status: DC | PRN

## 2013-09-15 ENCOUNTER — Other Ambulatory Visit: Payer: Self-pay

## 2013-09-28 ENCOUNTER — Other Ambulatory Visit: Payer: Self-pay | Admitting: Internal Medicine

## 2013-09-29 NOTE — Telephone Encounter (Signed)
Electronic Rx request, please advise. 

## 2013-09-29 NOTE — Telephone Encounter (Signed)
Ok to refill,  printed rx  

## 2013-09-29 NOTE — Telephone Encounter (Signed)
Script faxed.

## 2013-09-30 ENCOUNTER — Encounter: Payer: Self-pay | Admitting: *Deleted

## 2013-10-12 ENCOUNTER — Other Ambulatory Visit: Payer: Self-pay

## 2013-10-12 DIAGNOSIS — Z1231 Encounter for screening mammogram for malignant neoplasm of breast: Secondary | ICD-10-CM

## 2013-10-14 ENCOUNTER — Ambulatory Visit (INDEPENDENT_AMBULATORY_CARE_PROVIDER_SITE_OTHER): Payer: Medicare Other | Admitting: Internal Medicine

## 2013-10-14 ENCOUNTER — Telehealth: Payer: Self-pay | Admitting: *Deleted

## 2013-10-14 ENCOUNTER — Encounter: Payer: Self-pay | Admitting: Internal Medicine

## 2013-10-14 VITALS — BP 108/70 | HR 69 | Temp 98.5°F | Resp 16 | Wt 151.5 lb

## 2013-10-14 DIAGNOSIS — E119 Type 2 diabetes mellitus without complications: Secondary | ICD-10-CM

## 2013-10-14 DIAGNOSIS — R0683 Snoring: Secondary | ICD-10-CM

## 2013-10-14 DIAGNOSIS — R609 Edema, unspecified: Secondary | ICD-10-CM

## 2013-10-14 DIAGNOSIS — R42 Dizziness and giddiness: Secondary | ICD-10-CM

## 2013-10-14 DIAGNOSIS — R0609 Other forms of dyspnea: Secondary | ICD-10-CM

## 2013-10-14 DIAGNOSIS — R5383 Other fatigue: Secondary | ICD-10-CM

## 2013-10-14 DIAGNOSIS — G4710 Hypersomnia, unspecified: Secondary | ICD-10-CM

## 2013-10-14 DIAGNOSIS — R5381 Other malaise: Secondary | ICD-10-CM

## 2013-10-14 DIAGNOSIS — M7989 Other specified soft tissue disorders: Secondary | ICD-10-CM

## 2013-10-14 DIAGNOSIS — I10 Essential (primary) hypertension: Secondary | ICD-10-CM

## 2013-10-14 DIAGNOSIS — R0989 Other specified symptoms and signs involving the circulatory and respiratory systems: Secondary | ICD-10-CM

## 2013-10-14 MED ORDER — LORAZEPAM 0.5 MG PO TABS: ORAL_TABLET | ORAL | Status: DC

## 2013-10-14 MED ORDER — FUROSEMIDE 20 MG PO TABS: 20.0000 mg | ORAL_TABLET | Freq: Every day | ORAL | Status: DC | PRN

## 2013-10-14 MED ORDER — ALPRAZOLAM 0.5 MG PO TABS: 0.5000 mg | ORAL_TABLET | Freq: Every evening | ORAL | Status: DC | PRN

## 2013-10-14 NOTE — Telephone Encounter (Signed)
Patient notified

## 2013-10-14 NOTE — Telephone Encounter (Signed)
Patient staeed you were going to call in furosemide for edema please advise,

## 2013-10-14 NOTE — Telephone Encounter (Signed)
Ok to refill,  Refill sent  

## 2013-10-14 NOTE — Patient Instructions (Signed)
Ultrasound to evaluate your calf swelling (office will call you)  Make appt for fasting labs (and urine)

## 2013-10-14 NOTE — Assessment & Plan Note (Addendum)
Asymmetric, left leg.  Per patient,  This is chronic .No history of surgery on left side.  Does report frequent leg cramps Venous duplex to be done today .  Prn lasix

## 2013-10-14 NOTE — Progress Notes (Signed)
Patient ID: Megan Bradley, female   DOB: Mar 28, 1941, 73 y.o.   MRN: [id number]   Patient Active Problem List   Diagnosis Date Noted  . Edema 10/14/2013  . Fracture of multiple ribs 05/30/2013  . Gait disturbance 05/30/2013  . History of pernicious anemia 01/11/2013  . Other malaise and fatigue 01/11/2013  . Hypovitaminosis D 01/11/2013  . Deviated septum 01/11/2013  . Fatigue 06/09/2012  . Hyperlipidemia 09/04/2011  . Chest pain radiating to jaw 08/25/2011  . Screening for breast cancer 03/13/2011  . Hypothyroidism   . Type II or unspecified type diabetes mellitus with neurological manifestations, not stated as uncontrolled(250.60)   . GERD (gastroesophageal reflux disease)   . Anxiety   . Dizziness 09/12/2010  . Syncope 09/12/2010  . HTN (hypertension) 09/12/2010    Subjective:  CC:   Chief Complaint  Patient presents with  . Follow-up    medication and sleep apnea    HPI:   Megan Bradley is a 73 y.o. female who presents for Follow up on chronic conditions including dizziness with recent falls,  Hypertension , DM and hypothyroidisim managed by [facility] endocrinology.  She was last seen in January after sustained multiple rib fractures during a fall.  Was referred for PT for chronic dizziness. Dizziness has improved and she has had no other falls.   Left le edema.  Chronic per patient ,  Had leg surgery for squamous cell Ca.  Stitches removed Tuesday.  Dermatologist noted asymmetric  LE edema  But did not order any doppler.  She has no history of trauma   Past Medical History  Diagnosis Date  . Vertigo   . Herpes zoster   . Anxiety disorder   . Orthostatic hypotension   . Coronary artery disease   . Hypothyroidism   . Diabetes mellitus   . Anxiety   . GERD (gastroesophageal reflux disease)     Past Surgical History  Procedure Laterality Date  . Cardiac catheterization  2003    Dr.Callwood, R/L heart cath,       The following portions of the patient's history  were reviewed and updated as appropriate: Allergies, current medications, and problem list.    Review of Systems:   Patient denies headache, fevers, malaise, unintentional weight loss, skin rash, eye pain, sinus congestion and sinus pain, sore throat, dysphagia,  hemoptysis , cough, dyspnea, wheezing, chest pain, palpitations, orthopnea, edema, abdominal pain, nausea, melena, diarrhea, constipation, flank pain, dysuria, hematuria, urinary  Frequency, nocturia, numbness, tingling, seizures,  Focal weakness, Loss of consciousness,  Tremor, insomnia, depression, anxiety, and suicidal ideation.     History   Social History  . Marital Status: Married    Spouse Name: N/A    Number of Children: N/A  . Years of Education: N/A   Occupational History  . Not on file.   Social History Main Topics  . Smoking status: Never Smoker   . Smokeless tobacco: Never Used  . Alcohol Use: Yes     Comment: wine once a week  . Drug Use: No  . Sexual Activity: Not on file   Other Topics Concern  . Not on file   Social History Narrative  . No narrative on file    Objective:  Filed Vitals:   10/14/13 1056  BP: 108/70  Pulse: 69  Temp: 98.5 F (36.9 C)  Resp: 16     General appearance: alert, cooperative and appears stated age Ears: normal TM's and external ear canals both ears Throat:  lips, mucosa, and tongue normal; teeth and gums normal Neck: no adenopathy, no carotid bruit, supple, symmetrical, trachea midline and thyroid not enlarged, symmetric, no tenderness/mass/nodules Back: symmetric, no curvature. ROM normal. No CVA tenderness. Lungs: clear to auscultation bilaterally Heart: regular rate and rhythm, S1, S2 normal, no murmur, click, rub or gallop Abdomen: soft, non-tender; bowel sounds normal; no masses,  no organomegaly Pulses: 2+ and symmetric Skin: Skin color, texture, turgor normal. No rashes or lesions Lymph nodes: Cervical, supraclavicular, and axillary nodes  normal.  Assessment and Plan:  Edema Asymmetric, left leg.  Per patient,  This is chronic .No history of surgery on left side.  Does report frequent leg cramps Venous duplex to be done today .  Prn lasix  HTN (hypertension) Well controlled on current regimen. Renal function stable,  by review of outside labs from [facility] Lab Results  Component Value Date   CREATININE 1.0 01/03/2013      Updated Medication List Outpatient Encounter Prescriptions as of 10/14/2013  Medication Sig  . ALPRAZolam (XANAX) 0.5 MG tablet Take 1 tablet (0.5 mg total) by mouth at bedtime as needed for anxiety.  . Cholecalciferol (VITAMIN D) 2000 UNITS CAPS Take 1 capsule by mouth daily.    . citalopram (CELEXA) 40 MG tablet Take 1 tablet (40 mg total) by mouth daily.  Marland Kitchen ezetimibe (ZETIA) 10 MG tablet Take 10 mg by mouth daily.    Marland Kitchen levothyroxine (SYNTHROID, LEVOTHROID) 88 MCG tablet Take 88 mcg by mouth daily.    Marland Kitchen LORazepam (ATIVAN) 0.5 MG tablet TAKE ONE TABLET BY MOUTH ONCE DAILY as needed for anxiety  . meclizine (ANTIVERT) 25 MG tablet Take 1 tablet (25 mg total) by mouth 3 (three) times daily as needed.  . nitroGLYCERIN (NITROSTAT) 0.4 MG SL tablet Place 1 tablet (0.4 mg total) under the tongue every 5 (five) minutes as needed for chest pain.  Marland Kitchen omeprazole (PRILOSEC) 40 MG capsule Take 1 capsule (40 mg total) by mouth daily.  . Probiotic Product (ALIGN) 4 MG CAPS Take 1 capsule by mouth.  . [DISCONTINUED] ALPRAZolam (XANAX) 0.5 MG tablet TAKE ONE TABLET BY MOUTH TWICE DAILY AS NEEDED  . [DISCONTINUED] LORazepam (ATIVAN) 0.5 MG tablet TAKE ONE TABLET BY MOUTH ONCE DAILY  . B Complex-C (SUPER B COMPLEX PO) Take 1 capsule by mouth daily.    . cyanocobalamin (,VITAMIN B-12,) 1000 MCG/ML injection Inject 1 mL (1,000 mcg total) into the muscle once. I ml IM injection monthly  . HYDROcodone-acetaminophen (NORCO/VICODIN) 5-325 MG per tablet Take 1-2 tablets by mouth 3 (three) times daily as needed for moderate pain.   . hyoscyamine (LEVSIN SL) 0.125 MG SL tablet Place 0.125 mg under the tongue every 4 (four) hours as needed.    . lidocaine (LIDODERM) 5 % Place 1 patch onto the skin daily. Remove & Discard patch within 12 hours or as directed by MD  . nitroGLYCERIN (NITROSTAT) 0.4 MG SL tablet Place 1 tablet (0.4 mg total) under the tongue every 5 (five) minutes as needed for chest pain.  . pantoprazole (PROTONIX) 40 MG tablet Take 1 tablet (40 mg total) by mouth daily.  . Syringe, Disposable, 1 ML MISC For b12 injections  . Vitamin D, Ergocalciferol, (DRISDOL) 50000 UNITS CAPS capsule TAKE ONE CAPSULE BY MOUTH ONCE A WEEK  . [DISCONTINUED] furosemide (LASIX) 20 MG tablet Take 1 tablet (20 mg total) by mouth daily as needed.     Orders Placed This Encounter  Procedures  . CBC with Differential  .  Hemoglobin A1c  . Microalbumin / creatinine urine ratio  . Comprehensive metabolic panel  . Lipid panel  . Nocturnal polysomnography (NPSG)  . Lower Extremity Venous Duplex Left    No Follow-up on file.

## 2013-10-14 NOTE — Progress Notes (Signed)
Pre-visit discussion using our clinic review tool. No additional management support is needed unless otherwise documented below in the visit note.  

## 2013-10-16 NOTE — Assessment & Plan Note (Signed)
Well controlled on current regimen. Renal function stable,  by review of outside labs from [facility] Lab Results  Component Value Date   CREATININE 1.0 01/03/2013

## 2013-10-16 NOTE — Assessment & Plan Note (Signed)
Referred to PT for balance disorder and frequent falls.

## 2013-10-17 ENCOUNTER — Other Ambulatory Visit (INDEPENDENT_AMBULATORY_CARE_PROVIDER_SITE_OTHER): Payer: Medicare Other

## 2013-10-17 DIAGNOSIS — E119 Type 2 diabetes mellitus without complications: Secondary | ICD-10-CM

## 2013-10-17 DIAGNOSIS — R5383 Other fatigue: Principal | ICD-10-CM

## 2013-10-17 DIAGNOSIS — R5381 Other malaise: Secondary | ICD-10-CM

## 2013-10-17 LAB — COMPREHENSIVE METABOLIC PANEL
ALT: 16 U/L (ref 0–35)
AST: 19 U/L (ref 0–37)
Albumin: 3.6 g/dL (ref 3.5–5.2)
Alkaline Phosphatase: 43 U/L (ref 39–117)
BILIRUBIN TOTAL: 0.6 mg/dL (ref 0.2–1.2)
BUN: 23 mg/dL (ref 6–23)
CO2: 30 meq/L (ref 19–32)
CREATININE: 0.9 mg/dL (ref 0.4–1.2)
Calcium: 9.2 mg/dL (ref 8.4–10.5)
Chloride: 107 mEq/L (ref 96–112)
GFR: 65.56 mL/min (ref >60.00)
Glucose, Bld: 109 mg/dL — ABNORMAL HIGH (ref 70–99)
Potassium: 4.5 mEq/L (ref 3.5–5.1)
SODIUM: 142 meq/L (ref 135–145)
Total Protein: 6.4 g/dL (ref 6.0–8.3)

## 2013-10-17 LAB — LIPID PANEL
CHOLESTEROL: 163 mg/dL (ref 0–200)
HDL: 70.2 mg/dL (ref >39.00)
LDL Cholesterol: 88 mg/dL (ref 0–99)
Total CHOL/HDL Ratio: 2
Triglycerides: 23 mg/dL (ref 0.0–149.0)
VLDL: 4.6 mg/dL (ref 0.0–40.0)

## 2013-10-17 LAB — MICROALBUMIN / CREATININE URINE RATIO
Creatinine,U: 105 mg/dL
Microalb Creat Ratio: 0.2 mg/g (ref 0.0–30.0)
Microalb, Ur: 0.2 mg/dL (ref 0.0–1.9)

## 2013-10-17 LAB — CBC WITH DIFFERENTIAL/PLATELET
BASOS ABS: 0 10*3/uL (ref 0.0–0.1)
Basophils Relative: 0.5 % (ref 0.0–3.0)
Eosinophils Absolute: 0.1 10*3/uL (ref 0.0–0.7)
Eosinophils Relative: 2.5 % (ref 0.0–5.0)
HCT: 39.2 % (ref 36.0–46.0)
Hemoglobin: 12.9 g/dL (ref 12.0–15.0)
LYMPHS ABS: 1.7 10*3/uL (ref 0.7–4.0)
Lymphocytes Relative: 30.8 % (ref 12.0–46.0)
MCHC: 33 g/dL (ref 30.0–36.0)
MCV: 93.8 fl (ref 78.0–100.0)
MONO ABS: 0.4 10*3/uL (ref 0.1–1.0)
Monocytes Relative: 6.6 % (ref 3.0–12.0)
Neutro Abs: 3.2 10*3/uL (ref 1.4–7.7)
Neutrophils Relative %: 59.6 % (ref 43.0–77.0)
Platelets: 171 10*3/uL (ref 150.0–400.0)
RBC: 4.18 Mil/uL (ref 3.87–5.11)
RDW: 15.3 % (ref 11.5–15.5)
WBC: 5.4 10*3/uL (ref 4.0–10.5)

## 2013-10-17 LAB — HEMOGLOBIN A1C: HEMOGLOBIN A1C: 6.5 % (ref 4.6–6.5)

## 2013-10-18 ENCOUNTER — Encounter: Payer: Self-pay | Admitting: Internal Medicine

## 2013-10-18 ENCOUNTER — Telehealth: Payer: Self-pay | Admitting: Internal Medicine

## 2013-10-18 NOTE — Telephone Encounter (Signed)
Dopplers were negative for DVT or superficial clot.

## 2013-10-18 NOTE — Telephone Encounter (Signed)
Left message, notifying of results.  

## 2013-10-22 ENCOUNTER — Emergency Department: Payer: Self-pay | Admitting: Emergency Medicine

## 2013-10-22 LAB — CK TOTAL AND CKMB (NOT AT ARMC)
CK, TOTAL: 77 U/L
CK-MB: 3.7 ng/mL — AB (ref 0.5–3.6)

## 2013-10-22 LAB — COMPREHENSIVE METABOLIC PANEL
Albumin: 3.3 g/dL — ABNORMAL LOW (ref 3.4–5.0)
Alkaline Phosphatase: 56 U/L
Anion Gap: 5 — ABNORMAL LOW (ref 7–16)
BILIRUBIN TOTAL: 0.4 mg/dL (ref 0.2–1.0)
BUN: 20 mg/dL — ABNORMAL HIGH (ref 7–18)
CALCIUM: 8.6 mg/dL (ref 8.5–10.1)
CHLORIDE: 106 mmol/L (ref 98–107)
CREATININE: 1.03 mg/dL (ref 0.60–1.30)
Co2: 29 mmol/L (ref 21–32)
EGFR (African American): >60
EGFR (Non-African Amer.): 55 — ABNORMAL LOW
Glucose: 107 mg/dL — ABNORMAL HIGH (ref 65–99)
Osmolality: 282 (ref 275–301)
Potassium: 3.8 mmol/L (ref 3.5–5.1)
SGOT(AST): 21 U/L (ref 15–37)
SGPT (ALT): 22 U/L (ref 12–78)
SODIUM: 140 mmol/L (ref 136–145)
Total Protein: 6.6 g/dL (ref 6.4–8.2)

## 2013-10-22 LAB — CBC
HCT: 38.7 % (ref 35.0–47.0)
HGB: 12.8 g/dL (ref 12.0–16.0)
MCH: 30.8 pg (ref 26.0–34.0)
MCHC: 33.1 g/dL (ref 32.0–36.0)
MCV: 93 fL (ref 80–100)
Platelet: 169 10*3/uL (ref 150–440)
RBC: 4.15 10*6/uL (ref 3.80–5.20)
RDW: 14.5 % (ref 11.5–14.5)
WBC: 7.1 10*3/uL (ref 3.6–11.0)

## 2013-10-22 LAB — TROPONIN I
Troponin-I: <0.02 ng/mL
Troponin-I: <0.02 ng/mL

## 2013-10-27 ENCOUNTER — Telehealth: Payer: Self-pay | Admitting: *Deleted

## 2013-10-27 NOTE — Telephone Encounter (Signed)
Patient stated that she is having esophageal spasm, stated she took Nitro as prescribed for spasm, and still did not get relief, patient went to ED where she was given ekg which was compared to previous and found to be the same. Patient released, ask patient if I could schedule appointment but she stated going out of town and will call next week also advised patient she should follow up with cardiologist, stated she would when she gets back into town. FYI

## 2013-11-07 ENCOUNTER — Encounter: Payer: Self-pay | Admitting: Internal Medicine

## 2013-11-07 ENCOUNTER — Other Ambulatory Visit: Payer: Self-pay | Admitting: *Deleted

## 2013-11-07 MED ORDER — OMEPRAZOLE 40 MG PO CPDR: 40.0000 mg | DELAYED_RELEASE_CAPSULE | Freq: Every day | ORAL | Status: DC

## 2013-11-16 ENCOUNTER — Other Ambulatory Visit: Payer: Self-pay

## 2013-11-16 ENCOUNTER — Ambulatory Visit
Admission: RE | Admit: 2013-11-16 | Discharge: 2013-11-16 | Disposition: A | Discharge status: Home / Self Care | Payer: Medicare Other | Source: Ambulatory Visit

## 2013-11-16 DIAGNOSIS — Z1231 Encounter for screening mammogram for malignant neoplasm of breast: Secondary | ICD-10-CM

## 2013-11-17 ENCOUNTER — Ambulatory Visit: Payer: Self-pay | Admitting: Internal Medicine

## 2013-11-22 ENCOUNTER — Telehealth: Payer: Self-pay | Admitting: Internal Medicine

## 2013-11-22 DIAGNOSIS — G4733 Obstructive sleep apnea (adult) (pediatric): Secondary | ICD-10-CM | POA: Insufficient documentation

## 2013-11-22 NOTE — Telephone Encounter (Signed)
Recent sleep study showed moderate to severe apnea while supine,  She will have to have an additional study to titrate her CPAP pressures to optimal levels.

## 2013-11-22 NOTE — Telephone Encounter (Signed)
Patient notified and voiced understanding.

## 2013-12-02 ENCOUNTER — Encounter: Payer: Self-pay | Admitting: Internal Medicine

## 2013-12-05 ENCOUNTER — Ambulatory Visit: Payer: Self-pay | Admitting: Internal Medicine

## 2013-12-14 ENCOUNTER — Telehealth: Payer: Self-pay | Admitting: Internal Medicine

## 2013-12-14 DIAGNOSIS — G4733 Obstructive sleep apnea (adult) (pediatric): Secondary | ICD-10-CM

## 2013-12-21 ENCOUNTER — Telehealth: Payer: Self-pay | Admitting: Internal Medicine

## 2013-12-21 DIAGNOSIS — G4733 Obstructive sleep apnea (adult) (pediatric): Secondary | ICD-10-CM

## 2013-12-21 NOTE — Telephone Encounter (Signed)
Patient has an appointment with MD on 01/13/14 and would like for me to hold order until patient can discuss with MD the need for CPAP.  FYI

## 2013-12-21 NOTE — Assessment & Plan Note (Signed)
CPAP study results done CPAP titrated from  6 to 10  ResMed airfit P10 Nasal Pillow xsmall

## 2013-12-21 NOTE — Telephone Encounter (Signed)
She has sleep apnea, mild dagnosed by all night CPAP  Study July 2015 . I have ordered the CPAP mask    AS A DME  AND PRINTE D THE ORDER

## 2014-01-12 ENCOUNTER — Ambulatory Visit (INDEPENDENT_AMBULATORY_CARE_PROVIDER_SITE_OTHER): Payer: Medicare Other | Admitting: Internal Medicine

## 2014-01-12 ENCOUNTER — Encounter: Payer: Self-pay | Admitting: Internal Medicine

## 2014-01-12 VITALS — BP 108/64 | HR 68 | Temp 98.2°F | Resp 18 | Ht 65.5 in | Wt 155.5 lb

## 2014-01-12 DIAGNOSIS — E785 Hyperlipidemia, unspecified: Secondary | ICD-10-CM

## 2014-01-12 DIAGNOSIS — E1149 Type 2 diabetes mellitus with other diabetic neurological complication: Secondary | ICD-10-CM

## 2014-01-12 DIAGNOSIS — G4733 Obstructive sleep apnea (adult) (pediatric): Secondary | ICD-10-CM

## 2014-01-12 NOTE — Progress Notes (Signed)
Patient ID: Megan Bradley, female   DOB: June 24, 1940, 73 y.o.   MRN: [id number]  Patient Active Problem List   Diagnosis Date Noted  . OSA (obstructive sleep apnea) 11/22/2013  . Edema 10/14/2013  . Fracture of multiple ribs 05/30/2013  . Gait disturbance 05/30/2013  . History of pernicious anemia 01/11/2013  . Other malaise and fatigue 01/11/2013  . Hypovitaminosis D 01/11/2013  . Deviated septum 01/11/2013  . Fatigue 06/09/2012  . Hyperlipidemia 09/04/2011  . Chest pain radiating to jaw 08/25/2011  . Screening for breast cancer 03/13/2011  . Hypothyroidism   . Type II or unspecified type diabetes mellitus with neurological manifestations, not stated as uncontrolled(250.60)   . GERD (gastroesophageal reflux disease)   . Anxiety   . Dizziness 09/12/2010  . Syncope 09/12/2010  . HTN (hypertension) 09/12/2010    Subjective:  CC:   Chief Complaint  Patient presents with  . Follow-up    sleep study    HPI:   Megan Bradley is a 73 y.o. female who presents for follow up on chronic medical issues.    She was recently diagnosed with Sleep apnea, moderate to severe, by study June 29th.  Titration study was done and she had difficulty tolerating the mask which was adjusted to a final pressure of 10 cm H2O.  She is considering holding off on starting treatment because during a recent dental evaluation at [facility] for a partial bridge her dentist noted a small throat and small mouth noted and was advised to have an ENT evaluation .   Diabetes management:  She is requesting transition of care from her endocrinologist to me .  she is up to date on surveillance an dis managing her DM with diet alone.     Past Medical History  Diagnosis Date  . Vertigo   . Herpes zoster   . Anxiety disorder   . Orthostatic hypotension   . Coronary artery disease   . Hypothyroidism   . Diabetes mellitus   . Anxiety   . GERD (gastroesophageal reflux disease)     Past Surgical History  Procedure  Laterality Date  . Cardiac catheterization  2003    Dr.Callwood, R/L heart cath,       The following portions of the patient's history were reviewed and updated as appropriate: Allergies, current medications, and problem list.    Review of Systems:   Patient denies headache, fevers, malaise, unintentional weight loss, skin rash, eye pain, sinus congestion and sinus pain, sore throat, dysphagia,  hemoptysis , cough, dyspnea, wheezing, chest pain, palpitations, orthopnea, edema, abdominal pain, nausea, melena, diarrhea, constipation, flank pain, dysuria, hematuria, urinary  Frequency, nocturia, numbness, tingling, seizures,  Focal weakness, Loss of consciousness,  Tremor, insomnia, depression, anxiety, and suicidal ideation.     History   Social History  . Marital Status: Married    Spouse Name: N/A    Number of Children: N/A  . Years of Education: N/A   Occupational History  . Not on file.   Social History Main Topics  . Smoking status: Never Smoker   . Smokeless tobacco: Never Used  . Alcohol Use: Yes     Comment: wine once a week  . Drug Use: No  . Sexual Activity: Not on file   Other Topics Concern  . Not on file   Social History Narrative  . No narrative on file    Objective:  Filed Vitals:   01/12/14 1224  BP: 108/64  Pulse: 68  Temp: 98.2 F (36.8 C)  Resp: 18     General appearance: alert, cooperative and appears stated age Ears: normal TM's and external ear canals both ears Throat: lips, mucosa, and tongue normal; teeth and gums normal Neck: no adenopathy, no carotid bruit, supple, symmetrical, trachea midline and thyroid not enlarged, symmetric, no tenderness/mass/nodules Back: symmetric, no curvature. ROM normal. No CVA tenderness. Lungs: clear to auscultation bilaterally Heart: regular rate and rhythm, S1, S2 normal, no murmur, click, rub or gallop Abdomen: soft, non-tender; bowel sounds normal; no masses,  no organomegaly Pulses: 2+ and  symmetric Skin: Skin color, texture, turgor normal. No rashes or lesions Lymph nodes: Cervical, supraclavicular, and axillary nodes normal.  Assessment and Plan:  OSA (obstructive sleep apnea) Recent sleep study showed moderate to severe apnea , AHI was 23.9/hr ,  RDI 25/hr.  Titration study was done but patient has not received mask yet and is requesting ENT evaluation for airway   Type II or unspecified type diabetes mellitus with neurological manifestations, not stated as uncontrolled(250.60) Controlled with diet alone.  She is  On the appropriate medications  Lab Results  Component Value Date   HGBA1C 6.5 10/17/2013      Hyperlipidemia Well controlled on current therapy.   Liver enzymes are normal , no changes today.  Lab Results  Component Value Date   CHOL 163 10/17/2013   HDL 70.20 10/17/2013   LDLCALC 88 10/17/2013   TRIG 23.0 10/17/2013   CHOLHDL 2 10/17/2013    Lab Results  Component Value Date   ALT 16 10/17/2013   AST 19 10/17/2013   ALKPHOS 43 10/17/2013   BILITOT 0.6 10/17/2013      Updated Medication List Outpatient Encounter Prescriptions as of 01/12/2014  Medication Sig  . ALPRAZolam (XANAX) 0.5 MG tablet Take 1 tablet (0.5 mg total) by mouth at bedtime as needed for anxiety.  . citalopram (CELEXA) 40 MG tablet Take 1 tablet (40 mg total) by mouth daily.  Marland Kitchen ezetimibe (ZETIA) 10 MG tablet Take 10 mg by mouth daily.    . furosemide (LASIX) 20 MG tablet Take 1 tablet (20 mg total) by mouth daily as needed.  Marland Kitchen levothyroxine (SYNTHROID, LEVOTHROID) 88 MCG tablet Take 88 mcg by mouth daily.    Marland Kitchen LORazepam (ATIVAN) 0.5 MG tablet TAKE ONE TABLET BY MOUTH ONCE DAILY as needed for anxiety  . meclizine (ANTIVERT) 25 MG tablet Take 1 tablet (25 mg total) by mouth 3 (three) times daily as needed.  . nitroGLYCERIN (NITROSTAT) 0.4 MG SL tablet Place 1 tablet (0.4 mg total) under the tongue every 5 (five) minutes as needed for chest pain.  Marland Kitchen omeprazole (PRILOSEC) 40 MG  capsule Take 1 capsule (40 mg total) by mouth daily.  . Probiotic Product (ALIGN) 4 MG CAPS Take 1 capsule by mouth.  . B Complex-C (SUPER B COMPLEX PO) Take 1 capsule by mouth daily.    . Cholecalciferol (VITAMIN D) 2000 UNITS CAPS Take 1 capsule by mouth daily.    . cyanocobalamin (,VITAMIN B-12,) 1000 MCG/ML injection Inject 1 mL (1,000 mcg total) into the muscle once. I ml IM injection monthly  . pantoprazole (PROTONIX) 40 MG tablet Take 1 tablet (40 mg total) by mouth daily.  . Syringe, Disposable, 1 ML MISC For b12 injections  . Vitamin D, Ergocalciferol, (DRISDOL) 50000 UNITS CAPS capsule TAKE ONE CAPSULE BY MOUTH ONCE A WEEK  . [DISCONTINUED] HYDROcodone-acetaminophen (NORCO/VICODIN) 5-325 MG per tablet Take 1-2 tablets by mouth 3 (three) times daily as  needed for moderate pain.  . [DISCONTINUED] hyoscyamine (LEVSIN SL) 0.125 MG SL tablet Place 0.125 mg under the tongue every 4 (four) hours as needed.    . [DISCONTINUED] lidocaine (LIDODERM) 5 % Place 1 patch onto the skin daily. Remove & Discard patch within 12 hours or as directed by MD  . [DISCONTINUED] nitroGLYCERIN (NITROSTAT) 0.4 MG SL tablet Place 1 tablet (0.4 mg total) under the tongue every 5 (five) minutes as needed for chest pain.     Orders Placed This Encounter  Procedures  . HM DIABETES EYE EXAM  . HM DIABETES FOOT EXAM    No Follow-up on file.

## 2014-01-12 NOTE — Patient Instructions (Addendum)
Eucerin is the best moisturizing skin lotion  You have moderately severe sleep apnea  You should bring your CPAP apparatus with you to [place]   E

## 2014-01-12 NOTE — Progress Notes (Signed)
Pre-visit discussion using our clinic review tool. No additional management support is needed unless otherwise documented below in the visit note.  

## 2014-01-13 ENCOUNTER — Ambulatory Visit: Payer: Medicare Other | Admitting: Internal Medicine

## 2014-01-15 ENCOUNTER — Encounter: Payer: Self-pay | Admitting: Internal Medicine

## 2014-01-15 LAB — HM DIABETES FOOT EXAM: HM Diabetic Foot Exam: NORMAL

## 2014-01-15 NOTE — Assessment & Plan Note (Signed)
Controlled with diet alone.  She is  On the appropriate medications  Lab Results  Component Value Date   HGBA1C 6.5 10/17/2013

## 2014-01-15 NOTE — Assessment & Plan Note (Signed)
Recent sleep study showed moderate to severe apnea , AHI was 23.9/hr ,  RDI 25/hr.  Titration study was done but patient has not received mask yet and is requesting ENT evaluation for airway

## 2014-01-15 NOTE — Assessment & Plan Note (Signed)
Well controlled on current therapy.   Liver enzymes are normal , no changes today.  Lab Results  Component Value Date   CHOL 163 10/17/2013   HDL 70.20 10/17/2013   LDLCALC 88 10/17/2013   TRIG 23.0 10/17/2013   CHOLHDL 2 10/17/2013    Lab Results  Component Value Date   ALT 16 10/17/2013   AST 19 10/17/2013   ALKPHOS 43 10/17/2013   BILITOT 0.6 10/17/2013

## 2014-03-01 ENCOUNTER — Telehealth: Payer: Self-pay | Admitting: Internal Medicine

## 2014-03-01 DIAGNOSIS — Z0279 Encounter for issue of other medical certificate: Secondary | ICD-10-CM

## 2014-03-01 DIAGNOSIS — E1149 Type 2 diabetes mellitus with other diabetic neurological complication: Secondary | ICD-10-CM

## 2014-03-01 MED ORDER — AMOXICILLIN 500 MG PO CAPS: 500.0000 mg | ORAL_CAPSULE | Freq: Three times a day (TID) | ORAL | Status: DC

## 2014-03-01 NOTE — Telephone Encounter (Signed)
amox sent to pharmacy  Letter on printer

## 2014-03-01 NOTE — Telephone Encounter (Signed)
Patient requesting and ABX for trip to [place] patient stated she does well with AMOXICILLIN 500 mg BID, Please advise. Patient stated this was mentioned at last visit, also patient ask if she needs a letter stating ok to take snacks and Glucometer on plane being that she is diabetic.

## 2014-03-01 NOTE — Telephone Encounter (Signed)
Patient notified copy of letter sent for billing.

## 2014-03-03 ENCOUNTER — Other Ambulatory Visit: Payer: Self-pay | Admitting: Internal Medicine

## 2014-03-03 NOTE — Telephone Encounter (Signed)
Last refill 3.23.15, last OV 8.6.15.  Please advise refill

## 2014-03-04 ENCOUNTER — Ambulatory Visit (INDEPENDENT_AMBULATORY_CARE_PROVIDER_SITE_OTHER): Payer: Medicare Other

## 2014-03-04 DIAGNOSIS — Z23 Encounter for immunization: Secondary | ICD-10-CM

## 2014-03-06 NOTE — Telephone Encounter (Signed)
Ok to refill,  Refill sent  

## 2014-03-27 LAB — HM DIABETES EYE EXAM

## 2014-04-06 ENCOUNTER — Other Ambulatory Visit: Payer: Self-pay | Admitting: Internal Medicine

## 2014-04-06 NOTE — Telephone Encounter (Signed)
Last refill 8.7.15, last OV 9.25.15.  Please advise refill

## 2014-04-07 ENCOUNTER — Other Ambulatory Visit: Payer: Self-pay | Admitting: Internal Medicine

## 2014-04-07 MED ORDER — LORAZEPAM 0.5 MG PO TABS: ORAL_TABLET | ORAL | Status: DC

## 2014-04-07 NOTE — Telephone Encounter (Signed)
Ok to refill,  printed rx  

## 2014-04-07 NOTE — Progress Notes (Signed)
Refill faxed

## 2014-05-02 ENCOUNTER — Other Ambulatory Visit: Payer: Self-pay | Admitting: *Deleted

## 2014-05-02 MED ORDER — CITALOPRAM HYDROBROMIDE 40 MG PO TABS: 40.0000 mg | ORAL_TABLET | Freq: Every day | ORAL | Status: DC

## 2014-05-22 ENCOUNTER — Telehealth: Payer: Self-pay | Admitting: Internal Medicine

## 2014-05-22 DIAGNOSIS — G4733 Obstructive sleep apnea (adult) (pediatric): Secondary | ICD-10-CM

## 2014-05-22 NOTE — Assessment & Plan Note (Signed)
Compliance report for November 97% days worn,  80% greater than 4 hours  CPAP 10 cm H20

## 2014-05-31 ENCOUNTER — Encounter: Payer: Self-pay | Admitting: Internal Medicine

## 2014-06-07 ENCOUNTER — Telehealth: Payer: Self-pay | Admitting: Internal Medicine

## 2014-06-07 ENCOUNTER — Encounter: Payer: Self-pay | Admitting: Internal Medicine

## 2014-06-07 ENCOUNTER — Ambulatory Visit (INDEPENDENT_AMBULATORY_CARE_PROVIDER_SITE_OTHER): Payer: Medicare Other | Admitting: Internal Medicine

## 2014-06-07 VITALS — BP 108/70 | HR 67 | Temp 98.4°F | Resp 14 | Ht 65.5 in | Wt 158.5 lb

## 2014-06-07 DIAGNOSIS — G4733 Obstructive sleep apnea (adult) (pediatric): Secondary | ICD-10-CM

## 2014-06-07 DIAGNOSIS — I10 Essential (primary) hypertension: Secondary | ICD-10-CM

## 2014-06-07 DIAGNOSIS — E042 Nontoxic multinodular goiter: Secondary | ICD-10-CM

## 2014-06-07 DIAGNOSIS — E038 Other specified hypothyroidism: Secondary | ICD-10-CM

## 2014-06-07 DIAGNOSIS — Z23 Encounter for immunization: Secondary | ICD-10-CM

## 2014-06-07 DIAGNOSIS — F419 Anxiety disorder, unspecified: Secondary | ICD-10-CM

## 2014-06-07 DIAGNOSIS — E119 Type 2 diabetes mellitus without complications: Secondary | ICD-10-CM

## 2014-06-07 MED ORDER — LORAZEPAM 0.5 MG PO TABS: ORAL_TABLET | ORAL | Status: DC

## 2014-06-07 NOTE — Progress Notes (Signed)
Patient ID: Megan Bradley, female   DOB: [birth year], 73 y.o.   MRN: [id number]   Patient Active Problem List   Diagnosis Date Noted  . OSA (obstructive sleep apnea) 11/22/2013  . Fracture of multiple ribs 05/30/2013  . Gait disturbance 05/30/2013  . History of pernicious anemia 01/11/2013  . Other malaise and fatigue 01/11/2013  . Hypovitaminosis D 01/11/2013  . Deviated septum 01/11/2013  . Hyperlipidemia 09/04/2011  . Abnormal cells of cervix 08/27/2011  . Multinodular goiter 08/27/2011  . Screening for breast cancer 03/13/2011  . Hypothyroidism   . Diabetes mellitus without complication   . GERD (gastroesophageal reflux disease)   . Anxiety   . Syncope 09/12/2010    Subjective:  CC:   Chief Complaint  Patient presents with  . Follow-up    sleep med and to pneumonia vaccine    HPI:   Megan Bradley is a 73 y.o. female who presents for  Follow up on multiple issues, including DM Type 2, hypothyroidism, and recent diagnosis of OSA and initiation of CPAP.  She has been wearing her CPAP nightly and notes improved sleep quality,  Less daytime fatigue and less hypersomnolence .  However her insurance,  [employer] has denied payment bc they assert that the [employer] folks did not get prior authorization for the CPAP and needs an appeal to pay for the CPAP.   Past Medical History  Diagnosis Date  . Vertigo   . Herpes zoster   . Anxiety disorder   . Orthostatic hypotension   . Coronary artery disease   . Hypothyroidism   . Diabetes mellitus   . Anxiety   . GERD (gastroesophageal reflux disease)     Past Surgical History  Procedure Laterality Date  . Cardiac catheterization  2003    Dr.Callwood, R/L heart cath,       The following portions of the patient's history were reviewed and updated as appropriate: Allergies, current medications, and problem list.    Review of Systems:   Patient denies headache, fevers, malaise, unintentional weight loss, skin  rash, eye pain, sinus congestion and sinus pain, sore throat, dysphagia,  hemoptysis , cough, dyspnea, wheezing, chest pain, palpitations, orthopnea, edema, abdominal pain, nausea, melena, diarrhea, constipation, flank pain, dysuria, hematuria, urinary  Frequency, nocturia, numbness, tingling, seizures,  Focal weakness, Loss of consciousness,  Tremor, insomnia, depression, anxiety, and suicidal ideation.     History   Social History  . Marital Status: Married    Spouse Name: N/A    Number of Children: N/A  . Years of Education: N/A   Occupational History  . Not on file.   Social History Main Topics  . Smoking status: Never Smoker   . Smokeless tobacco: Never Used  . Alcohol Use: Yes     Comment: wine once a week  . Drug Use: No  . Sexual Activity: Not on file   Other Topics Concern  . Not on file   Social History Narrative    Objective:  Filed Vitals:   06/07/14 1127  BP: 108/70  Pulse: 67  Temp: 98.4 F (36.9 C)  Resp: 14     General appearance: alert, cooperative and appears stated age Ears: normal TM's and external ear canals both ears Throat: lips, mucosa, and tongue normal; teeth and gums normal Neck: no adenopathy, no carotid bruit, supple, symmetrical, trachea midline and thyroid not enlarged, symmetric, no tenderness/mass/nodules Back: symmetric, no curvature. ROM normal. No CVA tenderness. Lungs: clear to auscultation  bilaterally Heart: regular rate and rhythm, S1, S2 normal, no murmur, click, rub or gallop Abdomen: soft, non-tender; bowel sounds normal; no masses,  no organomegaly Pulses: 2+ and symmetric Skin: Skin color, texture, turgor normal. No rashes or lesions Lymph nodes: Cervical, supraclavicular, and axillary nodes normal. Foot exam:  Nails are well trimmed,  No callouses,  Sensation intact to microfilament   Assessment and Plan:  Anxiety Aggravated by use  of CPAP which has been difficult to adjust to.  Authorized continued use of low dose  alprazolam at bedtime to acclimate to mask.  Continue lorazepam prn  for daytime use and citalopram .   Diabetes mellitus without complication  Remains well-controlled on diet alone .  hemoglobin A1c has been consistently at or  less than 7.0 . Patient is up-to-date on eye exams and foot exam is normal today. Patient has no microalbuminuria. Patient is statin intolerant  and  Has no proteinuria    Lab Results  Component Value Date   HGBA1C 6.8* 06/07/2014   Lab Results  Component Value Date   MICROALBUR 0.2 10/17/2013        OSA (obstructive sleep apnea) Diagnosed by sleep study. She is wearing her CPAP every night a minimum of 6 hours per night and notes improved daytime wakefulness and decreased fatigue .  Compliance report for Nov to Dec 805 of days > 4 hours,  17%  Less than 4 hours.   Multinodular goiter Managed with serial ultrsounds and a total of 7 FNAs over the years.  All benign colloidal tissue.  On synthroid to suppress growth,  Lab Results  Component Value Date   TSH 0.77 06/07/2014     A total of 40 minutes was spent with patient more than half of which was spent in counseling patient on the above mentioned issues , reviewing and explaining recent labs and imaging studies done, and coordination of care.  Updated Medication List Outpatient Encounter Prescriptions as of 06/07/2014  Medication Sig  . ALPRAZolam (XANAX) 0.5 MG tablet Take 1 tablet (0.5 mg total) by mouth at bedtime as needed for anxiety.  . citalopram (CELEXA) 40 MG tablet Take 1 tablet (40 mg total) by mouth daily.  Marland Kitchen ezetimibe (ZETIA) 10 MG tablet Take 10 mg by mouth daily.    . furosemide (LASIX) 20 MG tablet Take 1 tablet (20 mg total) by mouth daily as needed.  Marland Kitchen levothyroxine (SYNTHROID, LEVOTHROID) 88 MCG tablet Take 88 mcg by mouth daily.    Marland Kitchen LORazepam (ATIVAN) 0.5 MG tablet TAKE ONE TABLET BY MOUTH ONCE DAILY AS NEEDED FOR ANXIETY  . meclizine (ANTIVERT) 25 MG tablet TAKE ONE TABLET BY  MOUTH THREE TIMES DAILY AS NEEDED  . nitroGLYCERIN (NITROSTAT) 0.4 MG SL tablet Place 1 tablet (0.4 mg total) under the tongue every 5 (five) minutes as needed for chest pain.  Marland Kitchen omeprazole (PRILOSEC) 40 MG capsule Take 1 capsule (40 mg total) by mouth daily.  . Probiotic Product (ALIGN) 4 MG CAPS Take 1 capsule by mouth.  . Syringe, Disposable, 1 ML MISC For b12 injections  . tretinoin (RETIN-A) 0.05 % cream Apply 1 application topically daily.  . [DISCONTINUED] LORazepam (ATIVAN) 0.5 MG tablet TAKE ONE TABLET BY MOUTH ONCE DAILY AS NEEDED FOR ANXIETY  . amoxicillin (AMOXIL) 500 MG capsule Take 1 capsule (500 mg total) by mouth 3 (three) times daily. (Patient not taking: Reported on 06/07/2014)  . B Complex-C (SUPER B COMPLEX PO) Take 1 capsule by mouth daily.    Marland Kitchen  Cholecalciferol (VITAMIN D) 2000 UNITS CAPS Take 1 capsule by mouth daily.    . cyanocobalamin (,VITAMIN B-12,) 1000 MCG/ML injection Inject 1 mL (1,000 mcg total) into the muscle once. I ml IM injection monthly (Patient not taking: Reported on 06/07/2014)  . Vitamin D, Ergocalciferol, (DRISDOL) 50000 UNITS CAPS capsule TAKE ONE CAPSULE BY MOUTH ONCE A WEEK (Patient not taking: Reported on 06/07/2014)  . [DISCONTINUED] LORazepam (ATIVAN) 0.5 MG tablet TAKE ONE TABLET BY MOUTH ONCE DAILY as needed for anxiety (Patient not taking: Reported on 06/07/2014)  . [DISCONTINUED] pantoprazole (PROTONIX) 40 MG tablet Take 1 tablet (40 mg total) by mouth daily.     Orders Placed This Encounter  Procedures  . Pneumococcal conjugate vaccine 13-valent  . Hemoglobin A1c  . Comprehensive metabolic panel  . Lipid panel  . TSH  . HM DIABETES EYE EXAM    Return in about 3 months (around 09/06/2014).

## 2014-06-07 NOTE — Progress Notes (Signed)
Pre-visit discussion using our clinic review tool. No additional management support is needed unless otherwise documented below in the visit note.  

## 2014-06-07 NOTE — Patient Instructions (Addendum)
You  may use the alprazolam to help you initiate sleep   I will write an appeal to [employer] for the CPAP issue  You received the Prevnar vaccine today     This is  my version of a  "Low GI"  Diet:  It will still lower your blood sugars and allow you to lose 4 to 8  lbs  per month if you follow it carefully.  Your goal with exercise is a minimum of 30 minutes of aerobic exercise 5 days per week (Walking does not count once it becomes easy!)    All of the foods can be found at grocery stores and in bulk at [employer].  The Atkins protein bars and shakes are available in more varieties at Target, [employer] and [place].     7 AM Breakfast:  Choose from the following:  Low carbohydrate Protein  Shakes (I recommend the EAS AdvantEdge "Carb Control" shakes  Or the low carb shakes by Atkins.    2.5 carbs   Arnold's "Sandwhich Thin"toasted  w/ peanut butter (no jelly: about 20 net carbs  "Bagel Thin" with cream cheese and salmon: about 20 carbs   a scrambled egg/bacon/cheese burrito made with Mission's "carb balance" whole wheat tortilla  (about 10 net carbs )   Avoid cereal and bananas, oatmeal and cream of wheat and grits. They are loaded with carbohydrates!   10 AM: high protein snack  Protein bar by Atkins (the snack size, under 200 cal, usually < 6 net carbs).    A stick of cheese:  Around 1 carb,  100 cal     Dannon Light n Fit [place] Yogurt  (80 cal, 8 carbs)  Other so called "protein bars" and Greek yogurts tend to be loaded with carbohydrates.  Remember, in food advertising, the word "energy" is synonymous for " carbohydrate."  Lunch:   A Sandwich using the bread choices listed, Can use any  Eggs,  lunchmeat, grilled meat or canned tuna), avocado, regular mayo/mustard  and cheese.  A Salad using blue cheese, ranch,  Goddess or vinagrette,  No croutons or "confetti" and no "candied nuts" but regular nuts OK.   No pretzels or chips.  Pickles and miniature sweet peppers are a good low  carb alternative that provide a "crunch"  The bread is the only source of carbohydrate in a sandwich and  can be decreased by trying some of these alternatives to traditional loaf bread  Joseph's makes a pita bread and a flat bread that are 50 cal and 4 net carbs available at [place] and [place].  This can be toasted to use with hummous as well  Toufayan makes a low carb flatbread that's 100 cal and 9 net carbs available at [employer] and [employer]J's makes 2 sizes of  Low carb whole wheat tortilla  (The large one is 210 cal and 6 net carbs) Avoid "Low fat dressings, as well as Barry Brunner and Orange dressings They are loaded with sugar!   3 PM/ Mid day  Snack:  Consider  1 ounce of  almonds, walnuts, pistachios, pecans, peanuts,  Macadamia nuts or a nut medley.  Avoid "granola"; the dried cranberries and raisins are loaded with carbohydrates. Mixed nuts as long as there are no raisins,  cranberries or dried fruit.    Try the prosciutto/mozzarella cheese sticks by Fiorruci  In deli /backery section   High protein      6 PM  Dinner:  Meat/fowl/fish with a green salad, and either broccoli, cauliflower, green beans, spinach, brussel sprouts or  Lima beans. DO NOT BREAD THE PROTEIN!!      There is a low carb pasta by Dreamfield's that is acceptable and tastes great: only 5 digestible carbs/serving.( All grocery stores but BJs carry it )  Try Hurley Cisco Angelo's chicken piccata or chicken or eggplant parm over low carb pasta.(Lowes and BJs)   Marjory Lies Sanchez's "Carnitas" (pulled pork, no sauce,  0 carbs) or his beef pot roast to make a dinner burrito (at BJ's)  Pesto over low carb pasta (bj's sells a good quality pesto in the center refrigerated section of the deli   Try satueeing  Cheral Marker with mushroooms  Whole wheat pasta is still full of digestible carbs and  Not as low in glycemic index as Dreamfield's.   Brown rice is still rice,  So skip the rice and noodles if you eat [place] or [place] (or at  least limit to 1/2 cup)  9 PM snack :   Breyer's "low carb" fudgsicle or  ice cream bar (Carb Smart line), or  Weight Watcher's ice cream bar , or another "no sugar added" ice cream;  a serving of fresh berries/cherries with whipped cream   Cheese or DANNON'S LlGHT N FIT GREEK YOGURT  8 ounces of [place] unsweetened almond/cococunut milk    Avoid bananas, pineapple, grapes  and watermelon on a regular basis because they are high in sugar.  THINK OF THEM AS DESSERT  Remember that snack Substitutions should be less than 10 NET carbs per serving and meals < 20 carbs. Remember to subtract fiber grams to get the "net carbs."

## 2014-06-07 NOTE — Telephone Encounter (Signed)
Adjusted margins and faxed paperwork as requested.

## 2014-06-07 NOTE — Telephone Encounter (Signed)
Letter of appeal to [employer] written for CPAP nonpayment, but margins are off the page,. Are you able to correct it?

## 2014-06-08 LAB — COMPREHENSIVE METABOLIC PANEL
ALT: 18 U/L (ref 0–35)
AST: 17 U/L (ref 0–37)
Albumin: 3.8 g/dL (ref 3.5–5.2)
Alkaline Phosphatase: 50 U/L (ref 39–117)
BILIRUBIN TOTAL: 0.6 mg/dL (ref 0.2–1.2)
BUN: 22 mg/dL (ref 6–23)
CALCIUM: 9.1 mg/dL (ref 8.4–10.5)
CHLORIDE: 107 meq/L (ref 96–112)
CO2: 27 mEq/L (ref 19–32)
Creatinine, Ser: 0.9 mg/dL (ref 0.4–1.2)
GFR: 66.29 mL/min (ref >60.00)
Glucose, Bld: 82 mg/dL (ref 70–99)
POTASSIUM: 4.4 meq/L (ref 3.5–5.1)
Sodium: 139 mEq/L (ref 135–145)
Total Protein: 6.4 g/dL (ref 6.0–8.3)

## 2014-06-08 LAB — LIPID PANEL
CHOL/HDL RATIO: 3
CHOLESTEROL: 190 mg/dL (ref 0–200)
HDL: 72.6 mg/dL (ref >39.00)
LDL CALC: 105 mg/dL — AB (ref 0–99)
NONHDL: 117.4
TRIGLYCERIDES: 62 mg/dL (ref 0.0–149.0)
VLDL: 12.4 mg/dL (ref 0.0–40.0)

## 2014-06-08 LAB — TSH: TSH: 0.77 u[IU]/mL (ref 0.35–4.50)

## 2014-06-08 LAB — HEMOGLOBIN A1C: HEMOGLOBIN A1C: 6.8 % — AB (ref 4.6–6.5)

## 2014-06-09 NOTE — Assessment & Plan Note (Addendum)
Diagnosed by sleep study. She is wearing her CPAP every night a minimum of 6 hours per night and notes improved daytime wakefulness and decreased fatigue .  Compliance report for Nov to Dec 805 of days > 4 hours,  17%  Less than 4 hours.

## 2014-06-09 NOTE — Assessment & Plan Note (Signed)
Remains well-controlled on diet alone .  hemoglobin A1c has been consistently at or  less than 7.0 . Patient is up-to-date on eye exams and foot exam is normal today. Patient has no microalbuminuria. Patient is statin intolerant  and  Has no proteinuria    Lab Results  Component Value Date   HGBA1C 6.8* 06/07/2014   Lab Results  Component Value Date   MICROALBUR 0.2 10/17/2013

## 2014-06-09 NOTE — Assessment & Plan Note (Addendum)
Aggravated by use  of CPAP which has been difficult to adjust to.  Authorized continued use of low dose alprazolam at bedtime to acclimate to mask.  Continue lorazepam prn  for daytime use and citalopram .

## 2014-06-09 NOTE — Assessment & Plan Note (Signed)
Managed with serial ultrsounds and a total of 7 FNAs over the years.  All benign colloidal tissue.  On synthroid to suppress growth,  Lab Results  Component Value Date   TSH 0.77 06/07/2014

## 2014-06-10 ENCOUNTER — Encounter: Payer: Self-pay | Admitting: Internal Medicine

## 2014-07-28 ENCOUNTER — Other Ambulatory Visit: Payer: Self-pay | Admitting: *Deleted

## 2014-07-28 ENCOUNTER — Telehealth: Payer: Self-pay | Admitting: *Deleted

## 2014-07-28 MED ORDER — OMEPRAZOLE 40 MG PO CPDR: 40.0000 mg | DELAYED_RELEASE_CAPSULE | Freq: Every day | ORAL | Status: DC

## 2014-07-28 NOTE — Telephone Encounter (Signed)
Fax from pharmacy requesting alprazolam and Citalopram refills.  Last OV 12.30.15.  Please advise refills

## 2014-07-29 MED ORDER — ALPRAZOLAM 0.5 MG PO TABS: 0.5000 mg | ORAL_TABLET | Freq: Every evening | ORAL | Status: DC | PRN

## 2014-07-29 MED ORDER — CITALOPRAM HYDROBROMIDE 40 MG PO TABS: 40.0000 mg | ORAL_TABLET | Freq: Every day | ORAL | Status: DC

## 2014-07-29 NOTE — Telephone Encounter (Signed)
Ok to refill, refills approved

## 2014-07-31 ENCOUNTER — Other Ambulatory Visit: Payer: Self-pay | Admitting: *Deleted

## 2014-07-31 MED ORDER — CITALOPRAM HYDROBROMIDE 40 MG PO TABS: 40.0000 mg | ORAL_TABLET | Freq: Every day | ORAL | Status: DC

## 2014-07-31 NOTE — Telephone Encounter (Signed)
Rx called in 

## 2014-08-24 DIAGNOSIS — E042 Nontoxic multinodular goiter: Secondary | ICD-10-CM | POA: Diagnosis not present

## 2014-10-01 NOTE — H&P (Signed)
PATIENT NAME:  Megan Bradley, Megan Bradley MR#:  [id number] DATE OF BIRTH:  Jan 31, 1941  DATE OF ADMISSION:  08/24/2011  PRIMARY CARE PHYSICIAN: Dr. Derrel Nip  CARDIOLOGIST: Dr. Rockey Situ   CHIEF COMPLAINT: Chest pain.   HISTORY OF PRESENT ILLNESS: This is a 74 year old female with history of gastroesophageal reflux disease and esophageal spasm. Today she had some pain in the jaw radiated down into the chest described as a pressure-type feeling, 7 to 8/10 in intensity. It lasted 40 minutes. She tried taking three tablets of her hyoscyamine which usually relieves her pain for esophageal spasm. The pain did not relieve. It was associated with a shaking feeling, nausea feeling but no sweating or shortness of breath. She was sitting at the computer at the time of the episode. Of note, she has noticed some left foot and ankle swelling over the past few days and had set up an appointment for tomorrow with Dr. Derrel Nip. She did take an aspirin today with this episode. In the Emergency Room she was found to have a borderline d-dimer and a CT scan which was negative for pulmonary embolism and an ultrasound of the lower extremity which was negative then hospitalist services were contacted for further evaluation.   PAST MEDICAL HISTORY:  1. Esophageal spasm. 2. Gastroesophageal reflux disease. 3. Hypothyroidism. 4. Hyperlipidemia. 5. Depression, anxiety.  6. Hypertension.   PAST SURGICAL HISTORY:  1. Cataracts. 2. [facility] biopsy. 3. Numerous thyroid biopsies.   ALLERGIES: Statins, codeine and fentanyl.    MEDICATIONS:  1. Synthroid 88 mcg daily.  2. Zetia 10 mg daily.  3. Celexa 40 mg daily.  4. Protonix 40 mg daily. 5. Hyoscyamine 0.125 mg sublingually p.r.n. esophageal spasm.  6. Meclizine p.r.n. dizziness.  7. Alprazolam 0.25 mg b.i.d.   SOCIAL HISTORY: No smoking. No drug use. Does have a wine on Fridays. Retired Loss adjuster, [employer].   FAMILY HISTORY: Father died at 73 of a myocardial infarction.  Mother died at 72, had Alzheimer's and an MI. Brother with kidney transplant.    REVIEW OF SYSTEMS: CONSTITUTIONAL: Positive for fatigue. No fever, chills, or sweats. No weight gain, weight loss. EYES: She does have a retinal problem for her left eye and wears glasses. EARS, NOSE, MOUTH, AND THROAT: Positive for sore throat. Positive for dysphagia to liquids. CARDIOVASCULAR: Positive for chest pain. No palpitations. RESPIRATORY: No shortness of breath. No coughing. No sputum. No hemoptysis. GASTROINTESTINAL: Positive for nausea. No vomiting. No abdominal pain. No bright red blood per rectum. No melena. No diarrhea. No constipation. GENITOURINARY: No burning on urination. No hematuria. MUSCULOSKELETAL: Positive for left leg pain. INTEGUMENT: No rashes or eruptions. NEUROLOGIC: No fainting or blackouts recently but has had a history of passing out in the past. ENDOCRINE: Positive for hypothyroidism and nodules. HEMATOLOGIC/LYMPHATIC: No anemia. No easy bruising or bleeding.   PHYSICAL EXAMINATION:  VITAL SIGNS: Last vital signs include: Temperature 97.1, pulse 69, respirations 18, blood pressure 131/73, pulse oximetry 97%.   GENERAL: No respiratory distress.   EYES: Conjunctivae and lids normal. Pupils equal, round, reactive to light. Extraocular muscles intact. No nystagmus.   EARS, NOSE, MOUTH, AND THROAT: Nasal mucosa no erythema. Throat no erythema. No exudate seen. Lips and gums no lesions.   NECK: No JVD. No bruits. No lymphadenopathy.   CARDIOVASCULAR: S1, S2 normal. No gallops, rubs, or murmurs heard. Carotid upstroke 2+ bilaterally. No bruits.   EXTREMITIES: Dorsalis pedis pulses 2+ bilaterally.   RESPIRATORY: Lungs clear to auscultation. No use of accessory muscles to breathe. No  rhonchi, rales, or wheeze heard.   ABDOMEN: Soft, nontender. No organomegaly/splenomegaly. Normoactive bowel sounds. No masses felt.   LYMPHATIC: No lymph nodes in the neck.   MUSCULOSKELETAL: No clubbing.  Patient states that her left leg is more swollen than the right. She does have some tenderness on the lateral left calf. No clubbing. No cyanosis.   SKIN: No rashes or ulcers seen.   NEUROLOGIC: Cranial nerves II through XII grossly intact. Deep tendon reflexes 2+ bilateral lower extremities.   PSYCHIATRIC: Patient is oriented to person, place, and time.   LABORATORY, DIAGNOSTIC, AND RADIOLOGICAL DATA: CT scan of the chest showed no pulmonary embolism. Left thyroid gland is heterogeneous in appearance and enlarged. Coarse calcifications. Recommend correlation with a nonemergent thyroid ultrasound. Ultrasound of the lower extremities showed no evidence of deep vein thrombosis in the left lower extremity. Cardiac enzymes negative. Glucose 105, BUN 18, creatinine 0.98, sodium 144, potassium 3.8, chloride 105, CO2 27, calcium 9.2. Liver function tests no abnormalities. D-dimer 1.3. White blood cell count 7.5, hemoglobin and hematocrit 13.4 and 40.0, platelet count 179, PT 12.4. INR 0.9. Troponin negative. EKG showed a normal sinus rhythm, 65 beats per minute, left axis deviation and a right bundle branch block. Looking back at old EKG the right bundle branch block is old.   ASSESSMENT AND PLAN:  1. Chest pain. Patient does have a history of esophageal spasm and gastroesophageal reflux disease. She thinks this is the issue. I did offer her observation in the hospital including offering a stress test and cardiac enzymes to make sure this is not cardiac in nature. I also offered the possibility of an endoscopy tomorrow if the cardiac enzymes were negative. The patient would like to go home and follow up as outpatient. She does have an outpatient appointment with Dr. Derrel Nip. I will order a second cardiac enzyme. If that is negative I feel that she is okay to go home. I do recommend an aspirin at this point. She can continue her usual hyoscyamine and Protonix for the acid reflux and esophageal spasm.   2. Hypothyroidism. Continue Synthroid. TSH not checked in the Emergency Room. CT scan did show heterogeneous thyroid. The patient states that she does have an ultrasound yearly. Continue follow up as outpatient.  3. Anxiety, depression. Continue Celexa and alprazolam.  4. Hyperlipidemia. Continue Zetia.  5. If the cardiac enzyme is negative will discharge from the ER. If the cardiac enzyme is positive of course will change my mind and do it admission.   TIME SPENT ON EVALUATION: 60 minutes.   ____________________________ Tana Conch. Leslye Peer, MD rjw:cms D: 08/24/2011 19:15:54 ET T: 08/25/2011 07:40:19 ET Megan Bradley#: [id number]  cc: Tana Conch. Leslye Peer, MD, <Dictator> Deborra Medina, MD Minna Merritts, MD Marisue Brooklyn MD ELECTRONICALLY SIGNED 08/29/2011 17:46

## 2014-10-02 DIAGNOSIS — D485 Neoplasm of uncertain behavior of skin: Secondary | ICD-10-CM | POA: Diagnosis not present

## 2014-10-02 DIAGNOSIS — D040 Carcinoma in situ of skin of lip: Secondary | ICD-10-CM | POA: Diagnosis not present

## 2014-10-02 DIAGNOSIS — Z85828 Personal history of other malignant neoplasm of skin: Secondary | ICD-10-CM | POA: Diagnosis not present

## 2014-10-02 DIAGNOSIS — C44722 Squamous cell carcinoma of skin of right lower limb, including hip: Secondary | ICD-10-CM | POA: Diagnosis not present

## 2014-10-27 ENCOUNTER — Other Ambulatory Visit: Payer: Self-pay

## 2014-10-27 DIAGNOSIS — Z1231 Encounter for screening mammogram for malignant neoplasm of breast: Secondary | ICD-10-CM

## 2014-11-09 DIAGNOSIS — C4402 Squamous cell carcinoma of skin of lip: Secondary | ICD-10-CM | POA: Diagnosis not present

## 2014-11-09 DIAGNOSIS — L578 Other skin changes due to chronic exposure to nonionizing radiation: Secondary | ICD-10-CM | POA: Diagnosis not present

## 2014-11-15 DIAGNOSIS — C44722 Squamous cell carcinoma of skin of right lower limb, including hip: Secondary | ICD-10-CM | POA: Diagnosis not present

## 2014-11-16 DIAGNOSIS — L905 Scar conditions and fibrosis of skin: Secondary | ICD-10-CM | POA: Diagnosis not present

## 2014-11-20 ENCOUNTER — Other Ambulatory Visit: Payer: Self-pay | Admitting: Gynecology

## 2014-11-20 DIAGNOSIS — Z1212 Encounter for screening for malignant neoplasm of rectum: Secondary | ICD-10-CM | POA: Diagnosis not present

## 2014-11-20 DIAGNOSIS — Z779 Other contact with and (suspected) exposures hazardous to health: Secondary | ICD-10-CM | POA: Diagnosis not present

## 2014-11-20 DIAGNOSIS — Z01419 Encounter for gynecological examination (general) (routine) without abnormal findings: Secondary | ICD-10-CM | POA: Diagnosis not present

## 2014-11-20 DIAGNOSIS — Z6826 Body mass index (BMI) 26.0-26.9, adult: Secondary | ICD-10-CM | POA: Diagnosis not present

## 2014-11-21 DIAGNOSIS — E119 Type 2 diabetes mellitus without complications: Secondary | ICD-10-CM | POA: Diagnosis not present

## 2014-11-21 DIAGNOSIS — Z9849 Cataract extraction status, unspecified eye: Secondary | ICD-10-CM | POA: Diagnosis not present

## 2014-11-21 DIAGNOSIS — H524 Presbyopia: Secondary | ICD-10-CM | POA: Diagnosis not present

## 2014-11-21 LAB — HM DIABETES EYE EXAM

## 2014-11-22 LAB — CYTOLOGY - PAP

## 2014-11-24 ENCOUNTER — Ambulatory Visit
Admission: RE | Admit: 2014-11-24 | Discharge: 2014-11-24 | Disposition: A | Discharge status: Home / Self Care | Payer: Medicare Other | Source: Ambulatory Visit

## 2014-11-24 DIAGNOSIS — Z1231 Encounter for screening mammogram for malignant neoplasm of breast: Secondary | ICD-10-CM

## 2014-11-29 DIAGNOSIS — D0471 Carcinoma in situ of skin of right lower limb, including hip: Secondary | ICD-10-CM | POA: Diagnosis not present

## 2014-12-04 ENCOUNTER — Other Ambulatory Visit: Payer: Self-pay

## 2014-12-05 DIAGNOSIS — R7309 Other abnormal glucose: Secondary | ICD-10-CM | POA: Diagnosis not present

## 2014-12-05 DIAGNOSIS — E042 Nontoxic multinodular goiter: Secondary | ICD-10-CM | POA: Diagnosis not present

## 2014-12-05 DIAGNOSIS — R0989 Other specified symptoms and signs involving the circulatory and respiratory systems: Secondary | ICD-10-CM | POA: Diagnosis not present

## 2014-12-26 ENCOUNTER — Other Ambulatory Visit: Payer: Self-pay | Admitting: Internal Medicine

## 2014-12-26 NOTE — Telephone Encounter (Signed)
Last OV 12.30.15, last refill 2.22.16.  Please advise refill

## 2014-12-27 NOTE — Telephone Encounter (Signed)
rx faxed

## 2014-12-27 NOTE — Telephone Encounter (Signed)
Refill for 30 days Will need six month follow up prior to any more refills

## 2014-12-29 ENCOUNTER — Other Ambulatory Visit: Payer: Self-pay | Admitting: Internal Medicine

## 2014-12-29 NOTE — Telephone Encounter (Signed)
Last OV 12.30.15, last refill 6.15.16.  Please advise refill

## 2014-12-29 NOTE — Telephone Encounter (Signed)
Appoint scheduled.  Pt aware Rx called in to pharmacy.

## 2014-12-29 NOTE — Telephone Encounter (Signed)
Refill for 30 days only.  She is Overdue by a month . Will need six month follow up prior to any more refills. plesae make her an appt

## 2015-01-01 ENCOUNTER — Encounter: Payer: Self-pay | Admitting: Internal Medicine

## 2015-01-01 ENCOUNTER — Ambulatory Visit (INDEPENDENT_AMBULATORY_CARE_PROVIDER_SITE_OTHER): Payer: Medicare Other | Admitting: Internal Medicine

## 2015-01-01 VITALS — BP 106/70 | HR 58 | Temp 98.1°F | Resp 12 | Ht 65.5 in | Wt 160.0 lb

## 2015-01-01 DIAGNOSIS — E119 Type 2 diabetes mellitus without complications: Secondary | ICD-10-CM

## 2015-01-01 DIAGNOSIS — G4733 Obstructive sleep apnea (adult) (pediatric): Secondary | ICD-10-CM

## 2015-01-01 DIAGNOSIS — M8580 Other specified disorders of bone density and structure, unspecified site: Secondary | ICD-10-CM | POA: Diagnosis not present

## 2015-01-01 DIAGNOSIS — F419 Anxiety disorder, unspecified: Secondary | ICD-10-CM

## 2015-01-01 DIAGNOSIS — E785 Hyperlipidemia, unspecified: Secondary | ICD-10-CM

## 2015-01-01 DIAGNOSIS — F418 Other specified anxiety disorders: Secondary | ICD-10-CM

## 2015-01-01 DIAGNOSIS — E042 Nontoxic multinodular goiter: Secondary | ICD-10-CM | POA: Diagnosis not present

## 2015-01-01 LAB — LIPID PANEL
Cholesterol: 170 mg/dL (ref 0–200)
HDL: 68.9 mg/dL (ref >39.00)
LDL Cholesterol: 91 mg/dL (ref 0–99)
NonHDL: 101.1
Total CHOL/HDL Ratio: 2
Triglycerides: 49 mg/dL (ref 0.0–149.0)
VLDL: 9.8 mg/dL (ref 0.0–40.0)

## 2015-01-01 MED ORDER — LORAZEPAM 0.5 MG PO TABS: 0.5000 mg | ORAL_TABLET | Freq: Every day | ORAL | Status: DC | PRN

## 2015-01-01 MED ORDER — ALPRAZOLAM 0.5 MG PO TABS: 0.5000 mg | ORAL_TABLET | Freq: Every evening | ORAL | Status: DC | PRN

## 2015-01-01 MED ORDER — SERTRALINE HCL 100 MG PO TABS: 100.0000 mg | ORAL_TABLET | Freq: Every day | ORAL | Status: DC

## 2015-01-01 NOTE — Patient Instructions (Addendum)
We are  changing your citalopram to 100 mg zoloft (sertraline) to help manage your anxiety better  You do not need to do any taper,  Just switch  Once you have switched,  Start reducing the ativan to 1/2 tablet daily in the monrining   After two weeks of the reduced daily ativan dose of 1/2 tablet ,  Start taking the ativan AS NEEDED,  (NOT SCHEDULED )  You then can also change the as needed ativan to as needed alprazolam for faster relief   os we do not have both benzo's active (safer)

## 2015-01-01 NOTE — Progress Notes (Signed)
Subjective:  Patient ID: Megan Bradley, female    DOB: [birth year]  Age: 74 y.o. MRN: [id number]  CC: The primary encounter diagnosis was Osteopenia. Diagnoses of Diabetes mellitus without complication, Anxiety with obsessional features, Multinodular goiter, OSA (obstructive sleep apnea), and Hyperlipidemia were also pertinent to this visit.  HPI Megan Bradley presents for follow up on chronci conditions   Stopped using CPAP .     Thyroid  Nodules,  Too numerous to count by recent ultrasound Reviewed,She is choking on water , has had coffee and pills come out her nose  trachea was deviated due to size of thyroid gland. Told she needs thyroidectomy.  Dr Lew Dawes is her endocrinologist  And has made a referral to surgeon at [facility] for thyroidectomy   Being treated with 5 FU rather  than repeat Moh's for  skin ca  Dr Delila Pereyra did PAP 2 months ago  Normal for the last 3 years   Mezer is retiring   History of dysplasia with prior DC Leeps coniizations etc. . Mammogram was also done  DEXA 2014    No inidiation for change  Taking 2000 units D3 since d was 28 2 weeks  Ago  DM : most recent a1c was 6.0  Had localozed reaction with arm pain and restricted ROM after receiving  Prevnar in left arm    Anxiety is out of control,  Has some OCD traits,  fastidious house cleaner,  Remains terrified of food poisoning,  Mild panic attacks  Takes a lorazepam in the morning  Scheduled  With her citalopram 40 mg daily.      Outpatient Prescriptions Prior to Visit  Medication Sig Dispense Refill  . Cholecalciferol (VITAMIN D) 2000 UNITS CAPS Take 1 capsule by mouth daily.      Marland Kitchen ezetimibe (ZETIA) 10 MG tablet Take 10 mg by mouth daily.      Marland Kitchen levothyroxine (SYNTHROID, LEVOTHROID) 88 MCG tablet Take 88 mcg by mouth daily.      . meclizine (ANTIVERT) 25 MG tablet TAKE ONE TABLET BY MOUTH THREE TIMES DAILY AS NEEDED 30 tablet 5  . nitroGLYCERIN (NITROSTAT) 0.4 MG SL tablet Place 1 tablet (0.4 mg total)  under the tongue every 5 (five) minutes as needed for chest pain. 50 tablet 3  . omeprazole (PRILOSEC) 40 MG capsule Take 1 capsule (40 mg total) by mouth daily. 90 capsule 1  . ALPRAZolam (XANAX) 0.5 MG tablet Take 1 tablet (0.5 mg total) by mouth at bedtime as needed for anxiety. 60 tablet 3  . citalopram (CELEXA) 40 MG tablet Take 1 tablet (40 mg total) by mouth daily. 90 tablet 1  . LORazepam (ATIVAN) 0.5 MG tablet TAKE ONE TABLET BY MOUTH ONCE DAILY AS NEEDED FOR ANXIETY 30 tablet 0  . Vitamin D, Ergocalciferol, (DRISDOL) 50000 UNITS CAPS capsule TAKE ONE CAPSULE BY MOUTH ONCE A WEEK 12 capsule 0  . amoxicillin (AMOXIL) 500 MG capsule Take 1 capsule (500 mg total) by mouth 3 (three) times daily. (Patient not taking: Reported on 06/07/2014) 30 capsule 0  . B Complex-C (SUPER B COMPLEX PO) Take 1 capsule by mouth daily.      . cyanocobalamin (,VITAMIN B-12,) 1000 MCG/ML injection Inject 1 mL (1,000 mcg total) into the muscle once. I ml IM injection monthly (Patient not taking: Reported on 06/07/2014) 10 mL 2  . furosemide (LASIX) 20 MG tablet Take 1 tablet (20 mg total) by mouth daily as needed. 90 tablet 1  .  Probiotic Product (ALIGN) 4 MG CAPS Take 1 capsule by mouth.    . Syringe, Disposable, 1 ML MISC For b12 injections 25 each 0  . tretinoin (RETIN-A) 0.05 % cream Apply 1 application topically daily.  3   No facility-administered medications prior to visit.    Review of Systems;  Patient denies headache, fevers, malaise, unintentional weight loss, skin rash, eye pain, sinus congestion and sinus pain, sore throat, dysphagia,  hemoptysis , cough, dyspnea, wheezing, chest pain, palpitations, orthopnea, edema, abdominal pain, nausea, melena, diarrhea, constipation, flank pain, dysuria, hematuria, urinary  Frequency, nocturia, numbness, tingling, seizures,  Focal weakness, Loss of consciousness,  Tremor, insomnia, depression, anxiety, and suicidal ideation.      Objective:  BP 106/70 mmHg   Pulse 58  Temp(Src) 98.1 F (36.7 C) (Oral)  Resp 12  Ht 5' 5.5" (1.664 m)  Wt 160 lb (72.576 kg)  BMI 26.21 kg/m2  SpO2 97%  BP Readings from Last 3 Encounters:  01/01/15 106/70  06/07/14 108/70  01/12/14 108/64    Wt Readings from Last 3 Encounters:  01/01/15 160 lb (72.576 kg)  06/07/14 158 lb 8 oz (71.895 kg)  01/12/14 155 lb 8 oz (70.534 kg)    General appearance: alert, cooperative and appears stated age Ears: normal TM's and external ear canals both ears Throat: lips, mucosa, and tongue normal; teeth and gums normal Neck: no adenopathy, no carotid bruit, supple, symmetrical, trachea midline and thyroid not enlarged, symmetric, no tenderness/mass/nodules Back: symmetric, no curvature. ROM normal. No CVA tenderness. Lungs: clear to auscultation bilaterally Heart: regular rate and rhythm, S1, S2 normal, no murmur, click, rub or gallop Abdomen: soft, non-tender; bowel sounds normal; no masses,  no organomegaly Pulses: 2+ and symmetric Skin: Skin color, texture, turgor normal. No rashes or lesions Lymph nodes: Cervical, supraclavicular, and axillary nodes normal.  Lab Results  Component Value Date   HGBA1C 6.8* 06/07/2014   HGBA1C 6.5 10/17/2013   HGBA1C 6.5 01/03/2013    Lab Results  Component Value Date   CREATININE 0.9 06/07/2014   CREATININE 1.03 10/22/2013   CREATININE 0.9 10/17/2013    Lab Results  Component Value Date   WBC 7.1 10/22/2013   HGB 12.8 10/22/2013   HCT 38.7 10/22/2013   PLT 169 10/22/2013   GLUCOSE 82 06/07/2014   CHOL 170 01/01/2015   TRIG 49.0 01/01/2015   HDL 68.90 01/01/2015   LDLCALC 91 01/01/2015   ALT 18 06/07/2014   AST 17 06/07/2014   NA 139 06/07/2014   K 4.4 06/07/2014   CL 107 06/07/2014   CREATININE 0.9 06/07/2014   BUN 22 06/07/2014   CO2 27 06/07/2014   TSH 0.77 06/07/2014   INR 0.9 12/05/2011   HGBA1C 6.8* 06/07/2014   MICROALBUR 0.2 10/17/2013    Mm Screening Breast W/implant Tomo Bilateral  11/24/2014    CLINICAL DATA:  Screening.  EXAM: DIGITAL SCREENING BILATERAL MAMMOGRAM WITH IMPLANTS, 3D TOMO WITH CAD  The patient has retro glandular implants. Standard and implant displaced views were performed.  COMPARISON:  Previous exam(s).  ACR Breast Density Category b: There are scattered areas of fibroglandular density.  FINDINGS: There are no findings suspicious for malignancy. Images were processed with CAD.  IMPRESSION: No mammographic evidence of malignancy. A result letter of this screening mammogram will be mailed directly to the patient.  RECOMMENDATION: Screening mammogram in one year. (Code:SM-B-01Y)  BI-RADS CATEGORY  1:  Negative.   Electronically Signed   By: Dedra Skeens.D.  On: 11/24/2014 16:21    Assessment & Plan:   Problem List Items Addressed This Visit      Unprioritized   Diabetes mellitus without complication    Currently well-controlled on current medications .  hemoglobin A1c is 6.0 per patient .Marland Kitchen Patient is reminded to schedule an annual eye exam and foot exam is normal today. Patient has no microalbuminuria. Patient is tolerating zetia for CAD risk reduction       Relevant Orders   Lipid panel (Completed)   Anxiety with obsessional features    Changing citalopram to zoloft 100 mg daily       Relevant Medications   LORazepam (ATIVAN) 0.5 MG tablet   sertraline (ZOLOFT) 100 MG tablet   ALPRAZolam (XANAX) 0.5 MG tablet   Hyperlipidemia    Well controlled on current therapy.   Liver enzymes are normal , no changes today.  Lab Results  Component Value Date   CHOL 170 01/01/2015   HDL 68.90 01/01/2015   LDLCALC 91 01/01/2015   TRIG 49.0 01/01/2015   CHOLHDL 2 01/01/2015    Lab Results  Component Value Date   ALT 18 06/07/2014   AST 17 06/07/2014   ALKPHOS 50 06/07/2014   BILITOT 0.6 06/07/2014           OSA (obstructive sleep apnea)    currently not treated due to MNG causiing tracheal deviation       Multinodular goiter    Enlargement noted with  tracheal deviation and dysphagia,  advised to have surgery       Other Visit Diagnoses    Osteopenia    -  Primary    Relevant Orders    DG Bone Density      A total of 25 minutes of face to face time was spent with patient more than half of which was spent in counselling about the above mentioned conditions  and coordination of care    I have discontinued Ms. Dunson's B Complex-C (SUPER B COMPLEX PO), cyanocobalamin, Syringe (Disposable), Vitamin D (Ergocalciferol), ALIGN, furosemide, amoxicillin, tretinoin, and citalopram. I have also changed her LORazepam. Additionally, I am having her start on sertraline. Lastly, I am having her maintain her ezetimibe, levothyroxine, Vitamin D, nitroGLYCERIN, meclizine, omeprazole, fluorouracil, and ALPRAZolam.  Meds ordered this encounter  Medications  . fluorouracil (EFUDEX) 5 % cream    Sig: Apply 1 application topically 2 (two) times daily.  Marland Kitchen LORazepam (ATIVAN) 0.5 MG tablet    Sig: Take 1 tablet (0.5 mg total) by mouth daily as needed for anxiety.    Dispense:  30 tablet    Refill:  5  . sertraline (ZOLOFT) 100 MG tablet    Sig: Take 1 tablet (100 mg total) by mouth daily.    Dispense:  90 tablet    Refill:  1  . ALPRAZolam (XANAX) 0.5 MG tablet    Sig: Take 1 tablet (0.5 mg total) by mouth at bedtime as needed for anxiety.    Dispense:  90 tablet    Refill:  1    Medications Discontinued During This Encounter  Medication Reason  . amoxicillin (AMOXIL) 500 MG capsule Completed Course  . B Complex-C (SUPER B COMPLEX PO) Completed Course  . cyanocobalamin (,VITAMIN B-12,) 1000 MCG/ML injection Completed Course  . furosemide (LASIX) 20 MG tablet Patient Preference  . Probiotic Product (ALIGN) 4 MG CAPS Completed Course  . Syringe, Disposable, 1 ML MISC Completed Course  . Vitamin D, Ergocalciferol, (DRISDOL) 50000 UNITS CAPS capsule  Completed Course  . tretinoin (RETIN-A) 0.05 % cream Completed Course  . LORazepam (ATIVAN) 0.5 MG  tablet Reorder  . citalopram (CELEXA) 40 MG tablet   . ALPRAZolam (XANAX) 0.5 MG tablet Reorder    Follow-up: No Follow-up on file.   Crecencio Mc, MD

## 2015-01-01 NOTE — Progress Notes (Signed)
Pre-visit discussion using our clinic review tool. No additional management support is needed unless otherwise documented below in the visit note.  

## 2015-01-02 NOTE — Assessment & Plan Note (Signed)
Well controlled on current therapy.   Liver enzymes are normal , no changes today.  Lab Results  Component Value Date   CHOL 170 01/01/2015   HDL 68.90 01/01/2015   LDLCALC 91 01/01/2015   TRIG 49.0 01/01/2015   CHOLHDL 2 01/01/2015    Lab Results  Component Value Date   ALT 18 06/07/2014   AST 17 06/07/2014   ALKPHOS 50 06/07/2014   BILITOT 0.6 06/07/2014

## 2015-01-02 NOTE — Assessment & Plan Note (Signed)
Changing citalopram to zoloft 100 mg daily

## 2015-01-02 NOTE — Assessment & Plan Note (Signed)
currently not treated due to MNG causiing tracheal deviation

## 2015-01-02 NOTE — Assessment & Plan Note (Signed)
Enlargement noted with tracheal deviation and dysphagia,  advised to have surgery

## 2015-01-02 NOTE — Assessment & Plan Note (Signed)
Currently well-controlled on current medications .  hemoglobin A1c is 6.0 per patient .Marland Kitchen Patient is reminded to schedule an annual eye exam and foot exam is normal today. Patient has no microalbuminuria. Patient is tolerating zetia for CAD risk reduction

## 2015-01-03 ENCOUNTER — Encounter: Payer: Self-pay | Admitting: Internal Medicine

## 2015-01-30 ENCOUNTER — Ambulatory Visit
Admission: RE | Admit: 2015-01-30 | Discharge: 2015-01-30 | Disposition: A | Discharge status: Home / Self Care | Payer: Medicare Other | Source: Ambulatory Visit | Attending: Internal Medicine | Admitting: Internal Medicine

## 2015-01-30 DIAGNOSIS — Z780 Asymptomatic menopausal state: Secondary | ICD-10-CM | POA: Diagnosis not present

## 2015-01-30 DIAGNOSIS — M85852 Other specified disorders of bone density and structure, left thigh: Secondary | ICD-10-CM | POA: Diagnosis not present

## 2015-01-30 LAB — HM DEXA SCAN

## 2015-01-31 ENCOUNTER — Encounter: Payer: Self-pay | Admitting: Internal Medicine

## 2015-02-01 ENCOUNTER — Other Ambulatory Visit: Payer: Self-pay | Admitting: Internal Medicine

## 2015-02-08 ENCOUNTER — Encounter: Payer: Self-pay | Admitting: Internal Medicine

## 2015-02-28 DIAGNOSIS — C44722 Squamous cell carcinoma of skin of right lower limb, including hip: Secondary | ICD-10-CM | POA: Diagnosis not present

## 2015-02-28 DIAGNOSIS — L570 Actinic keratosis: Secondary | ICD-10-CM | POA: Diagnosis not present

## 2015-02-28 DIAGNOSIS — Z85828 Personal history of other malignant neoplasm of skin: Secondary | ICD-10-CM | POA: Diagnosis not present

## 2015-02-28 DIAGNOSIS — D485 Neoplasm of uncertain behavior of skin: Secondary | ICD-10-CM | POA: Diagnosis not present

## 2015-03-01 ENCOUNTER — Encounter: Payer: Self-pay | Admitting: Internal Medicine

## 2015-03-02 ENCOUNTER — Telehealth: Payer: Self-pay | Admitting: *Deleted

## 2015-03-02 ENCOUNTER — Other Ambulatory Visit (INDEPENDENT_AMBULATORY_CARE_PROVIDER_SITE_OTHER): Payer: Medicare Other

## 2015-03-02 DIAGNOSIS — E034 Atrophy of thyroid (acquired): Secondary | ICD-10-CM | POA: Diagnosis not present

## 2015-03-02 DIAGNOSIS — Z23 Encounter for immunization: Secondary | ICD-10-CM | POA: Diagnosis not present

## 2015-03-02 DIAGNOSIS — R232 Flushing: Secondary | ICD-10-CM | POA: Diagnosis not present

## 2015-03-02 DIAGNOSIS — E038 Other specified hypothyroidism: Secondary | ICD-10-CM | POA: Diagnosis not present

## 2015-03-02 LAB — COMPREHENSIVE METABOLIC PANEL
ALT: 15 U/L (ref 6–29)
AST: 18 U/L (ref 10–35)
Albumin: 4 g/dL (ref 3.6–5.1)
Alkaline Phosphatase: 53 U/L (ref 33–130)
BUN: 19 mg/dL (ref 7–25)
CHLORIDE: 106 mmol/L (ref 98–110)
CO2: 27 mmol/L (ref 20–31)
Calcium: 8.9 mg/dL (ref 8.6–10.4)
Creat: 1 mg/dL — ABNORMAL HIGH (ref 0.60–0.93)
Glucose, Bld: 101 mg/dL — ABNORMAL HIGH (ref 65–99)
Potassium: 4.3 mmol/L (ref 3.5–5.3)
SODIUM: 141 mmol/L (ref 135–146)
Total Bilirubin: 0.4 mg/dL (ref 0.2–1.2)
Total Protein: 6.4 g/dL (ref 6.1–8.1)

## 2015-03-02 LAB — CBC WITH DIFFERENTIAL/PLATELET
Basophils Absolute: 0.1 10*3/uL (ref 0.0–0.1)
Basophils Relative: 1 % (ref 0–1)
Eosinophils Absolute: 0.1 10*3/uL (ref 0.0–0.7)
Eosinophils Relative: 2 % (ref 0–5)
HEMATOCRIT: 40 % (ref 36.0–46.0)
Hemoglobin: 13.3 g/dL (ref 12.0–15.0)
LYMPHS ABS: 2 10*3/uL (ref 0.7–4.0)
LYMPHS PCT: 30 % (ref 12–46)
MCH: 30.6 pg (ref 26.0–34.0)
MCHC: 33.3 g/dL (ref 30.0–36.0)
MCV: 92.2 fL (ref 78.0–100.0)
MONO ABS: 0.5 10*3/uL (ref 0.1–1.0)
MPV: 12.1 fL (ref 8.6–12.4)
Monocytes Relative: 8 % (ref 3–12)
Neutro Abs: 3.9 10*3/uL (ref 1.7–7.7)
Neutrophils Relative %: 59 % (ref 43–77)
Platelets: 189 10*3/uL (ref 150–400)
RBC: 4.34 MIL/uL (ref 3.87–5.11)
RDW: 14.8 % (ref 11.5–15.5)
WBC: 6.6 10*3/uL (ref 4.0–10.5)

## 2015-03-02 LAB — T3, FREE: T3, Free: 2.2 pg/mL — ABNORMAL LOW (ref 2.3–4.2)

## 2015-03-02 LAB — T4: T4, Total: 7.2 ug/dL (ref 4.5–12.0)

## 2015-03-02 LAB — TSH: TSH: 1.151 u[IU]/mL (ref 0.350–4.500)

## 2015-03-02 NOTE — Telephone Encounter (Signed)
Labs and dx?  

## 2015-03-02 NOTE — Telephone Encounter (Signed)
Pt is requesting a t3

## 2015-03-04 ENCOUNTER — Encounter: Payer: Self-pay | Admitting: Internal Medicine

## 2015-03-21 DIAGNOSIS — C44722 Squamous cell carcinoma of skin of right lower limb, including hip: Secondary | ICD-10-CM | POA: Diagnosis not present

## 2015-03-23 DIAGNOSIS — E042 Nontoxic multinodular goiter: Secondary | ICD-10-CM | POA: Diagnosis not present

## 2015-03-31 ENCOUNTER — Encounter: Payer: Self-pay | Admitting: Internal Medicine

## 2015-04-02 ENCOUNTER — Other Ambulatory Visit: Payer: Self-pay | Admitting: Internal Medicine

## 2015-04-02 MED ORDER — EZETIMIBE 10 MG PO TABS: 10.0000 mg | ORAL_TABLET | Freq: Every day | ORAL | Status: DC

## 2015-04-09 ENCOUNTER — Ambulatory Visit (INDEPENDENT_AMBULATORY_CARE_PROVIDER_SITE_OTHER): Payer: Medicare Other | Admitting: Internal Medicine

## 2015-04-09 ENCOUNTER — Telehealth: Payer: Self-pay | Admitting: Internal Medicine

## 2015-04-09 ENCOUNTER — Encounter: Payer: Self-pay | Admitting: Internal Medicine

## 2015-04-09 VITALS — BP 118/66 | HR 72 | Temp 98.3°F | Ht 65.5 in | Wt 160.2 lb

## 2015-04-09 DIAGNOSIS — M79622 Pain in left upper arm: Secondary | ICD-10-CM | POA: Diagnosis not present

## 2015-04-09 DIAGNOSIS — E042 Nontoxic multinodular goiter: Secondary | ICD-10-CM

## 2015-04-09 DIAGNOSIS — M25512 Pain in left shoulder: Secondary | ICD-10-CM

## 2015-04-09 NOTE — Patient Instructions (Addendum)
  I think your arm pain may be coming from bone spurs in your shoulder joint, so I am ordering x rays to confirm.  You can go to the [facility] office or [facility] tomorrow   We can try a steroid injection in the shoulder if this is the case.   [email or web address]

## 2015-04-09 NOTE — Progress Notes (Signed)
Subjective:  Patient ID: [name], female    DOB: 07/13/1940  Age: 74 y.o. MRN: [id number]  CC: The primary encounter diagnosis was Left upper arm pain. Diagnoses of Shoulder pain, left and Multinodular goiter were also pertinent to this visit.  HPI CALIANNE LARUE presents for follow up on multiple issuess  1)  left arm pain , has been present since she received the Prevnar injection in same shoulder last December.   The pain is brought on by abducting her arm to shoulder level  nd adducting her arm across her torso.   Has pain with passive ROM in superiori deltoid muscle   2) MNG:  She continues to cough with eating .  Having thyroid  resection due to tracheal deviation from goiter  Dec 6 overnight stay at [facility]   3) Skin Cancer: Right foot excisional  biopsy  By Dasher ,  Efudex procedure by Manley Mason  In August   On her upper  lip  Outpatient Prescriptions Prior to Visit  Medication Sig Dispense Refill  . ALPRAZolam (XANAX) 0.5 MG tablet Take 1 tablet (0.5 mg total) by mouth at bedtime as needed for anxiety. 90 tablet 1  . Cholecalciferol (VITAMIN D) 2000 UNITS CAPS Take 1 capsule by mouth daily.      Marland Kitchen ezetimibe (ZETIA) 10 MG tablet Take 1 tablet (10 mg total) by mouth daily. 90 tablet 1  . levothyroxine (SYNTHROID, LEVOTHROID) 88 MCG tablet Take 88 mcg by mouth daily.      . meclizine (ANTIVERT) 25 MG tablet TAKE ONE TABLET BY MOUTH THREE TIMES DAILY AS NEEDED 30 tablet 5  . nitroGLYCERIN (NITROSTAT) 0.4 MG SL tablet Place 1 tablet (0.4 mg total) under the tongue every 5 (five) minutes as needed for chest pain. 50 tablet 3  . omeprazole (PRILOSEC) 40 MG capsule Take 1 capsule by mouth  daily 90 capsule 1  . sertraline (ZOLOFT) 100 MG tablet Take 1 tablet (100 mg total) by mouth daily. 90 tablet 1  . fluorouracil (EFUDEX) 5 % cream Apply 1 application topically 2 (two) times daily.    Marland Kitchen LORazepam (ATIVAN) 0.5 MG tablet Take 1 tablet (0.5 mg total) by mouth daily as needed for anxiety.  (Patient not taking: Reported on 04/09/2015) 30 tablet 5   No facility-administered medications prior to visit.    Review of Systems;  Patient denies headache, fevers, malaise, unintentional weight loss, skin rash, eye pain, sinus congestion and sinus pain, sore throat, dysphagia,  hemoptysis , cough, dyspnea, wheezing, chest pain, palpitations, orthopnea, edema, abdominal pain, nausea, melena, diarrhea, constipation, flank pain, dysuria, hematuria, urinary  Frequency, nocturia, numbness, tingling, seizures,  Focal weakness, Loss of consciousness,  Tremor, insomnia, depression, anxiety, and suicidal ideation.      Objective:  BP 118/66 mmHg  Pulse 72  Temp(Src) 98.3 F (36.8 C) (Oral)  Ht 5' 5.5" (1.664 m)  Wt 160 lb 3.2 oz (72.666 kg)  BMI 26.24 kg/m2  SpO2 96%  BP Readings from Last 3 Encounters:  04/09/15 118/66  01/01/15 106/70  06/07/14 108/70    Wt Readings from Last 3 Encounters:  04/09/15 160 lb 3.2 oz (72.666 kg)  01/01/15 160 lb (72.576 kg)  06/07/14 158 lb 8 oz (71.895 kg)    General appearance: alert, cooperative and appears stated age Ears: normal TM's and external ear canals both ears Throat: lips, mucosa, and tongue normal; teeth and gums normal Neck: no adenopathy, no carotid bruit, supple, symmetrical, trachea midline and thyroid not  enlarged, symmetric, no tenderness/mass/nodules Back: symmetric, no curvature. ROM normal. No CVA tenderness. Lungs: clear to auscultation bilaterally Heart: regular rate and rhythm, S1, S2 normal, no murmur, click, rub or gallop Abdomen: soft, non-tender; bowel sounds normal; no masses,  no organomegaly MSK: pain in deltoid region with passive ROM during abduction and forward flexion.  Pulses: 2+ and symmetric Skin: Skin color, texture, turgor normal. No rashes or lesions Lymph nodes: Cervical, supraclavicular, and axillary nodes normal.  Lab Results  Component Value Date   HGBA1C 6.8* 06/07/2014   HGBA1C 6.5 10/17/2013     HGBA1C 6.5 01/03/2013    Lab Results  Component Value Date   CREATININE 1.00* 03/02/2015   CREATININE 0.9 06/07/2014   CREATININE 1.03 10/22/2013    Lab Results  Component Value Date   WBC 6.6 03/02/2015   HGB 13.3 03/02/2015   HCT 40.0 03/02/2015   PLT 189 03/02/2015   GLUCOSE 101* 03/02/2015   CHOL 170 01/01/2015   TRIG 49.0 01/01/2015   HDL 68.90 01/01/2015   LDLCALC 91 01/01/2015   ALT 15 03/02/2015   AST 18 03/02/2015   NA 141 03/02/2015   K 4.3 03/02/2015   CL 106 03/02/2015   CREATININE 1.00* 03/02/2015   BUN 19 03/02/2015   CO2 27 03/02/2015   TSH 1.151 03/02/2015   INR 0.9 12/05/2011   HGBA1C 6.8* 06/07/2014   MICROALBUR 0.2 10/17/2013    Dg Bone Density  01/30/2015  CLINICAL DATA:  74 year old postmenopausal white female currently taking vitamin-D supplementation. No bone building therapy. History of a hip fracture in the patient's mother at age 36. Follow-up exam. EXAM: DUAL X-RAY ABSORPTIOMETRY (DXA) FOR BONE MINERAL DENSITY FINDINGS: AP LUMBAR SPINE Bone Mineral Density (BMD):  0.960 g/cm2 Young Adult T-Score:  -0.8 Z-Score:  1.5 LEFT FEMUR NECK Bone Mineral Density (BMD):  0.683 g/cm2 Young Adult T-Score: -1.5 Z-Score:  0.4 ASSESSMENT: Patient's diagnostic category is LOW BONE MASS by WHO Criteria. FRACTURE RISK: Increased FRAX: Based on the [place] model, the 10 year probability of a major osteoporotic fracture is 25%. The 10 year probability of a hip fracture is 8%. COMPARISON: Since 09/25/2008 there has been no significant change in bone mineral density of the lumbar spine and no significant interval change in bone mineral density of the left hip. Since the baseline exam dated 08/09/2002 there has been a significant 4.7% decrease in bone mineral density of the lumbar spine and a 7% decrease in bone mineral density of the left hip. Effective therapies are available in the form of bisphosphonates, selective estrogen receptor modulators,  biologic agents, and hormone replacement therapy (for women). All patients should ensure an adequate intake of dietary calcium (1200 mg daily) and vitamin D (800 IU daily) unless contraindicated. All treatment decisions require clinical judgment and consideration of individual patient factors, including patient preferences, co-morbidities, previous drug use, risk factors not captured in the FRAX model (e.g., frailty, falls, vitamin D deficiency, increased bone turnover, interval significant decline in bone density) and possible under- or over-estimation of fracture risk by FRAX. The National Osteoporosis Foundation recommends that FDA-approved medical therapies be considered in postmenopausal women and men age 37 or older with a: 1. Hip or vertebral (clinical or morphometric) fracture. 2. T-score of -2.5 or lower at the spine or hip. 3. Ten-year fracture probability by FRAX of 3% or greater for hip fracture or 20% or greater for major osteoporotic fracture. People with diagnosed cases of osteoporosis or at high risk for fracture  should have regular bone mineral density tests. For patients eligible for Medicare, routine testing is allowed once every 2 years. The testing frequency can be increased to one year for patients who have rapidly progressing disease, those who are receiving or discontinuing medical therapy to restore bone mass, or have additional risk factors. [employer] [facility]) Criteria: Normal: T-scores from +1.0 to -1.0 Low Bone Mass (Osteopenia): T-scores between -1.0 and -2.5 Osteoporosis: T-scores -2.5 and below Comparison to Reference Population: T-score is the key measure used in the diagnosis of osteoporosis and relative risk determination for fracture. It provides a value for bone mass relative to the mean bone mass of a young adult reference population expressed in terms of standard deviation (SD). Z-score is the age-matched score showing the patient's values compared to a population  matched for age, sex, and race. This is also expressed in terms of standard deviation. The patient may have values that compare favorably to the age-matched values and still be at increased risk for fracture. Electronically Signed   By: Everlean Alstrom M.D.   On: 01/30/2015 13:41    Assessment & Plan:   Problem List Items Addressed This Visit    Multinodular goiter    For surgical resection due to tracheal deviation      Shoulder pain, left    Exam consistent with impingement from possible bone spurs.  X rays ordered.  If suggestive,  A trial of steroids        Other Visit Diagnoses    Left upper arm pain    -  Primary    Relevant Orders    DG Shoulder Left (Completed)       I am having Ms. Lograsso maintain her levothyroxine, Vitamin D, nitroGLYCERIN, meclizine, fluorouracil, LORazepam, sertraline, ALPRAZolam, omeprazole, and ezetimibe.  No orders of the defined types were placed in this encounter.    There are no discontinued medications.  Follow-up: No Follow-up on file.   Crecencio Mc, MD

## 2015-04-09 NOTE — Progress Notes (Signed)
Pre visit review using our clinic review tool, if applicable. No additional management support is needed unless otherwise documented below in the visit note. 

## 2015-04-10 ENCOUNTER — Ambulatory Visit (INDEPENDENT_AMBULATORY_CARE_PROVIDER_SITE_OTHER)
Admission: RE | Admit: 2015-04-10 | Discharge: 2015-04-10 | Disposition: A | Discharge status: Home / Self Care | Payer: Medicare Other | Source: Ambulatory Visit | Attending: Internal Medicine | Admitting: Internal Medicine

## 2015-04-10 DIAGNOSIS — M25512 Pain in left shoulder: Secondary | ICD-10-CM | POA: Insufficient documentation

## 2015-04-10 DIAGNOSIS — M79622 Pain in left upper arm: Secondary | ICD-10-CM

## 2015-04-10 DIAGNOSIS — M19012 Primary osteoarthritis, left shoulder: Secondary | ICD-10-CM | POA: Diagnosis not present

## 2015-04-10 NOTE — Assessment & Plan Note (Addendum)
For surgical resection due to tracheal deviation

## 2015-04-10 NOTE — Assessment & Plan Note (Addendum)
Exam consistent with impingement from possible bone spurs.  X rays ordered.  If suggestive,  A trial of steroids

## 2015-04-11 ENCOUNTER — Encounter: Payer: Self-pay | Admitting: Internal Medicine

## 2015-04-13 ENCOUNTER — Telehealth: Payer: Self-pay | Admitting: Internal Medicine

## 2015-04-13 NOTE — Telephone Encounter (Signed)
Left msg for pt to call office to schedule [email or web address] °

## 2015-04-17 ENCOUNTER — Encounter: Payer: Self-pay | Admitting: Internal Medicine

## 2015-04-18 ENCOUNTER — Ambulatory Visit (INDEPENDENT_AMBULATORY_CARE_PROVIDER_SITE_OTHER): Payer: Medicare Other

## 2015-04-18 VITALS — BP 122/62 | HR 63 | Temp 97.4°F | Resp 12 | Ht 66.0 in | Wt 162.6 lb

## 2015-04-18 DIAGNOSIS — Z0000 Encounter for general adult medical examination without abnormal findings: Secondary | ICD-10-CM | POA: Diagnosis not present

## 2015-04-18 NOTE — Patient Instructions (Addendum)
Megan Bradley,  Thank you for taking time to come for your Medicare Wellness Visit.  I appreciate your ongoing commitment to your health goals. Please review the following plan we discussed and let me know if I can assist you in the future.  Follow up with scheduled appointment with PCP    Health Maintenance, Female Adopting a healthy lifestyle and getting preventive care can go a long way to promote health and wellness. Talk with your health care provider about what schedule of regular examinations is right for you. This is a good chance for you to check in with your provider about disease prevention and staying healthy. In between checkups, there are plenty of things you can do on your own. Experts have done a lot of research about which lifestyle changes and preventive measures are most likely to keep you healthy. Ask your health care provider for more information. WEIGHT AND DIET  Eat a healthy diet  Be sure to include plenty of vegetables, fruits, low-fat dairy products, and lean protein.  Do not eat a lot of foods high in solid fats, added sugars, or salt.  Get regular exercise. This is one of the most important things you can do for your health.  Most adults should exercise for at least 150 minutes each week. The exercise should increase your heart rate and make you sweat (moderate-intensity exercise).  Most adults should also do strengthening exercises at least twice a week. This is in addition to the moderate-intensity exercise.  Maintain a healthy weight  Body mass index (BMI) is a measurement that can be used to identify possible weight problems. It estimates body fat based on height and weight. Your health care provider can help determine your BMI and help you achieve or maintain a healthy weight.  For females 68 years of age and older:   A BMI below 18.5 is considered underweight.  A BMI of 18.5 to 24.9 is normal.  A BMI of 25 to 29.9 is considered overweight.  A BMI of  30 and above is considered obese.  Watch levels of cholesterol and blood lipids  You should start having your blood tested for lipids and cholesterol at 72 years of age, then have this test every 5 years.  You may need to have your cholesterol levels checked more often if:  Your lipid or cholesterol levels are high.  You are older than 72 years of age.  You are at high risk for heart disease.  CANCER SCREENING   Lung Cancer  Lung cancer screening is recommended for adults 68-30 years old who are at high risk for lung cancer because of a history of smoking.  A yearly low-dose CT scan of the lungs is recommended for people who:  Currently smoke.  Have quit within the past 15 years.  Have at least a 30-pack-year history of smoking. A pack year is smoking an average of one pack of cigarettes a day for 1 year.  Yearly screening should continue until it has been 15 years since you quit.  Yearly screening should stop if you develop a health problem that would prevent you from having lung cancer treatment.  Breast Cancer  Practice breast self-awareness. This means understanding how your breasts normally appear and feel.  It also means doing regular breast self-exams. Let your health care provider know about any changes, no matter how small.  If you are in your 20s or 30s, you should have a clinical breast exam (CBE) by a health  care provider every 1-3 years as part of a regular health exam.  If you are 34 or older, have a CBE every year. Also consider having a breast X-ray (mammogram) every year.  If you have a family history of breast cancer, talk to your health care provider about genetic screening.  If you are at high risk for breast cancer, talk to your health care provider about having an MRI and a mammogram every year.  Breast cancer gene (BRCA) assessment is recommended for women who have family members with BRCA-related cancers. BRCA-related cancers  include:  Breast.  Ovarian.  Tubal.  Peritoneal cancers.  Results of the assessment will determine the need for genetic counseling and BRCA1 and BRCA2 testing. Cervical Cancer Your health care provider may recommend that you be screened regularly for cancer of the pelvic organs (ovaries, uterus, and vagina). This screening involves a pelvic examination, including checking for microscopic changes to the surface of your cervix (Pap test). You may be encouraged to have this screening done every 3 years, beginning at age 21.  For women ages 89-65, health care providers may recommend pelvic exams and Pap testing every 3 years, or they may recommend the Pap and pelvic exam, combined with testing for human papilloma virus (HPV), every 5 years. Some types of HPV increase your risk of cervical cancer. Testing for HPV may also be done on women of any age with unclear Pap test results.  Other health care providers may not recommend any screening for nonpregnant women who are considered low risk for pelvic cancer and who do not have symptoms. Ask your health care provider if a screening pelvic exam is right for you.  If you have had past treatment for cervical cancer or a condition that could lead to cancer, you need Pap tests and screening for cancer for at least 20 years after your treatment. If Pap tests have been discontinued, your risk factors (such as having a new sexual partner) need to be reassessed to determine if screening should resume. Some women have medical problems that increase the chance of getting cervical cancer. In these cases, your health care provider may recommend more frequent screening and Pap tests. Colorectal Cancer  This type of cancer can be detected and often prevented.  Routine colorectal cancer screening usually begins at 72 years of age and continues through 72 years of age.  Your health care provider may recommend screening at an earlier age if you have risk factors for  colon cancer.  Your health care provider may also recommend using home test kits to check for hidden blood in the stool.  A small camera at the end of a tube can be used to examine your colon directly (sigmoidoscopy or colonoscopy). This is done to check for the earliest forms of colorectal cancer.  Routine screening usually begins at age 36.  Direct examination of the colon should be repeated every 5-10 years through 72 years of age. However, you may need to be screened more often if early forms of precancerous polyps or small growths are found. Skin Cancer  Check your skin from head to toe regularly.  Tell your health care provider about any new moles or changes in moles, especially if there is a change in a mole's shape or color.  Also tell your health care provider if you have a mole that is larger than the size of a pencil eraser.  Always use sunscreen. Apply sunscreen liberally and repeatedly throughout the day.  Protect  yourself by wearing long sleeves, pants, a wide-brimmed hat, and sunglasses whenever you are outside. HEART DISEASE, DIABETES, AND HIGH BLOOD PRESSURE   High blood pressure causes heart disease and increases the risk of stroke. High blood pressure is more likely to develop in:  People who have blood pressure in the high end of the normal range (130-139/85-89 mm Hg).  People who are overweight or obese.  People who are African American.  If you are 54-65 years of age, have your blood pressure checked every 3-5 years. If you are 81 years of age or older, have your blood pressure checked every year. You should have your blood pressure measured twice--once when you are at a hospital or clinic, and once when you are not at a hospital or clinic. Record the average of the two measurements. To check your blood pressure when you are not at a hospital or clinic, you can use:  An automated blood pressure machine at a pharmacy.  A home blood pressure monitor.  If you  are between 81 years and 18 years old, ask your health care provider if you should take aspirin to prevent strokes.  Have regular diabetes screenings. This involves taking a blood sample to check your fasting blood sugar level.  If you are at a normal weight and have a low risk for diabetes, have this test once every three years after 72 years of age.  If you are overweight and have a high risk for diabetes, consider being tested at a younger age or more often. PREVENTING INFECTION  Hepatitis B  If you have a higher risk for hepatitis B, you should be screened for this virus. You are considered at high risk for hepatitis B if:  You were born in a country where hepatitis B is common. Ask your health care provider which countries are considered high risk.  Your parents were born in a high-risk country, and you have not been immunized against hepatitis B (hepatitis B vaccine).  You have HIV or AIDS.  You use needles to inject street drugs.  You live with someone who has hepatitis B.  You have had sex with someone who has hepatitis B.  You get hemodialysis treatment.  You take certain medicines for conditions, including cancer, organ transplantation, and autoimmune conditions. Hepatitis C  Blood testing is recommended for:  Everyone born from [birth year] through 1963.  Anyone with known risk factors for hepatitis C. Sexually transmitted infections (STIs)  You should be screened for sexually transmitted infections (STIs) including gonorrhea and chlamydia if:  You are sexually active and are younger than 72 years of age.  You are older than 72 years of age and your health care provider tells you that you are at risk for this type of infection.  Your sexual activity has changed since you were last screened and you are at an increased risk for chlamydia or gonorrhea. Ask your health care provider if you are at risk.  If you do not have HIV, but are at risk, it may be recommended that you  take a prescription medicine daily to prevent HIV infection. This is called pre-exposure prophylaxis (PrEP). You are considered at risk if:  You are sexually active and do not regularly use condoms or know the HIV status of your partner(s).  You take drugs by injection.  You are sexually active with a partner who has HIV. Talk with your health care provider about whether you are at high risk of being infected with  HIV. If you choose to begin PrEP, you should first be tested for HIV. You should then be tested every 3 months for as long as you are taking PrEP.  PREGNANCY   If you are premenopausal and you may become pregnant, ask your health care provider about preconception counseling.  If you may become pregnant, take 400 to 800 micrograms (mcg) of folic acid every day.  If you want to prevent pregnancy, talk to your health care provider about birth control (contraception). OSTEOPOROSIS AND MENOPAUSE   Osteoporosis is a disease in which the bones lose minerals and strength with aging. This can result in serious bone fractures. Your risk for osteoporosis can be identified using a bone density scan.  If you are 53 years of age or older, or if you are at risk for osteoporosis and fractures, ask your health care provider if you should be screened.  Ask your health care provider whether you should take a calcium or vitamin D supplement to lower your risk for osteoporosis.  Menopause may have certain physical symptoms and risks.  Hormone replacement therapy may reduce some of these symptoms and risks. Talk to your health care provider about whether hormone replacement therapy is right for you.  HOME CARE INSTRUCTIONS   Schedule regular health, dental, and eye exams.  Stay current with your immunizations.   Do not use any tobacco products including cigarettes, chewing tobacco, or electronic cigarettes.  If you are pregnant, do not drink alcohol.  If you are breastfeeding, limit how  much and how often you drink alcohol.  Limit alcohol intake to no more than 1 drink per day for nonpregnant women. One drink equals 12 ounces of beer, 5 ounces of wine, or 1 ounces of hard liquor.  Do not use street drugs.  Do not share needles.  Ask your health care provider for help if you need support or information about quitting drugs.  Tell your health care provider if you often feel depressed.  Tell your health care provider if you have ever been abused or do not feel safe at home.   This information is not intended to replace advice given to you by your health care provider. Make sure you discuss any questions you have with your health care provider.   Document Released: 12/09/2010 Document Revised: 06/16/2014 Document Reviewed: 04/27/2013 Elsevier Interactive Patient Education [employer].

## 2015-04-18 NOTE — Progress Notes (Signed)
Subjective:   Megan Bradley is a 74 y.o. female who presents for an Initial Medicare Annual Wellness Visit.  Review of Systems    No ROS.  Medicare Wellness Visit.  Cardiac Risk Factors include: advanced age (>68men, >53 women)     Objective:    Today's Vitals   04/18/15 1532  BP: 122/62  Pulse: 63  Temp: 97.4 F (36.3 C)  TempSrc: Oral  Resp: 12  Height: 5\' 6"  (1.676 m)  Weight: 162 lb 9.6 oz (73.755 kg)  SpO2: 98%    Current Medications (verified) Outpatient Encounter Prescriptions as of 04/18/2015  Medication Sig  . ALPRAZolam (XANAX) 0.5 MG tablet Take 1 tablet (0.5 mg total) by mouth at bedtime as needed for anxiety.  . Cholecalciferol (VITAMIN D) 2000 UNITS CAPS Take 1 capsule by mouth daily.    Marland Kitchen ezetimibe (ZETIA) 10 MG tablet Take 1 tablet (10 mg total) by mouth daily.  . fluorouracil (EFUDEX) 5 % cream Apply 1 application topically 2 (two) times daily.  Marland Kitchen levothyroxine (SYNTHROID, LEVOTHROID) 88 MCG tablet Take 88 mcg by mouth daily.    . meclizine (ANTIVERT) 25 MG tablet TAKE ONE TABLET BY MOUTH THREE TIMES DAILY AS NEEDED  . nitroGLYCERIN (NITROSTAT) 0.4 MG SL tablet Place 1 tablet (0.4 mg total) under the tongue every 5 (five) minutes as needed for chest pain.  Marland Kitchen omeprazole (PRILOSEC) 40 MG capsule Take 1 capsule by mouth  daily  . sertraline (ZOLOFT) 100 MG tablet Take 1 tablet (100 mg total) by mouth daily.  . [DISCONTINUED] LORazepam (ATIVAN) 0.5 MG tablet Take 1 tablet (0.5 mg total) by mouth daily as needed for anxiety. (Patient not taking: Reported on 04/18/2015)   No facility-administered encounter medications on file as of 04/18/2015.    Allergies (verified) Atorvastatin; Codeine; Fentanyl; Lipitor; and Tetracyclines & related   History: Past Medical History  Diagnosis Date  . Vertigo   . Herpes zoster   . Anxiety disorder   . Orthostatic hypotension   . Coronary artery disease   . Hypothyroidism   . Diabetes mellitus   . Anxiety   . GERD  (gastroesophageal reflux disease)    Past Surgical History  Procedure Laterality Date  . Cardiac catheterization  2003    Dr.Callwood, R/L heart cath,   Family History  Problem Relation Age of Onset  . Heart attack Mother   . Alcohol abuse Mother   . Mental illness Mother     alzheimer's Dementia  . Heart disease Mother 74  . Heart attack Father   . Heart disease Father 20    AMI  . Colon cancer Paternal Grandmother 63   Social History   Occupational History  . Not on file.   Social History Main Topics  . Smoking status: Never Smoker   . Smokeless tobacco: Never Used  . Alcohol Use: Yes     Comment: wine once a week  . Drug Use: No  . Sexual Activity: No    Tobacco Counseling Counseling given: Not Answered   Activities of Daily Living In your present state of health, do you have any difficulty performing the following activities: 04/18/2015  Hearing? N  Vision? N  Difficulty concentrating or making decisions? N  Walking or climbing stairs? N  Dressing or bathing? N  Doing errands, shopping? N  Preparing Food and eating ? N  Using the Toilet? N  In the past six months, have you accidently leaked urine? N  Do you have problems  with loss of bowel control? N  Managing your Medications? N  Managing your Finances? N  Housekeeping or managing your Housekeeping? N    Immunizations and Health Maintenance Immunization History  Administered Date(s) Administered  . Influenza Split 04/14/2011, 03/02/2012  . Influenza,inj,Quad PF,36+ Mos 03/30/2013, 03/04/2014, 03/02/2015  . Pneumococcal Conjugate-13 06/07/2014  . Pneumococcal Polysaccharide-23 06/07/2008  . Tdap 06/07/2012   Health Maintenance Due  Topic Date Due  . ZOSTAVAX  07/18/2002  . URINE MICROALBUMIN  10/18/2014  . HEMOGLOBIN A1C  12/07/2014  . COLONOSCOPY  01/12/2015    Patient Care Team: Crecencio Mc, MD as PCP - General (Internal Medicine)  Indicate any recent Medical Services you may have  received from other than [facility] providers in the past year (date may be approximate).     Assessment:   This is a routine wellness examination for [place].  The goal of the wellness visit is to assist the patient how to close the gaps in care and create a preventative care plan for the patient.   Calcium and Vit D as appropriate/ Osteoporosis risk reviewed.  Taking meds without issues; no barriers identified.   Safety issues reviewed; smoke detectors in the home. Firearms locked in a secure area. Wears seatbelts when driving or riding with others. No violence in the home.  The patient was oriented x 3; appropriate in dress and manner and no objective failures at ADL's or IADL's.   COLONOSCOPY declined.  Preference is COLOGUARD.  A1C and Diabetes followed [facility], Dr. Lew Dawes   Patient Concerns: Left shoulder pain.  Requests prednisone injection.  Deferred to PCP for follow up visit.  -DMII neuro nt st uncntrl-stable and followed by Dr. 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 Lew Dawes   Hearing/Vision screen Hearing Screening Comments: Passes the whisper test. Vision Screening Comments: Followed by [facility] Last OV 12/2014 Cataracts removed Wears glasses  Dietary issues and exercise activities discussed: Current Exercise Habits:: The patient does not participate in regular exercise at present  Goals    . Increase physical activity     Patient centered goal is to walk 1.5-2 miles for 4 days a week or as tolerated.      . Increase water intake     Currently drinks 1 bottle =2 cups of water daily.  Increase water intake up to 3 bottles =6 cups daily.      Depression Screen PHQ 2/9 Scores 04/18/2015 06/09/2014 06/07/2014 10/14/2013  PHQ - 2 Score 0 0 0 0    Fall Risk Fall Risk  04/18/2015 06/09/2014 06/07/2014 10/14/2013 10/14/2013  Falls in the past year? No No No Yes No  Number falls in past yr: - - - 1 -    Injury with Fall? - - - Yes -  Risk Factor Category  - - - High Fall Risk -    Cognitive Function: MMSE - Mini Mental State Exam 04/18/2015  Orientation to time 5  Orientation to Place 5  Registration 3  Attention/ Calculation 5  Recall 3  Language- name 2 objects 2  Language- repeat 1  Language- follow 3 step command 3  Language- read & follow direction 1  Write a sentence 1  Copy design 1  Total score 30    Screening Tests Health Maintenance  Topic Date Due  . ZOSTAVAX  07/18/2002  . URINE MICROALBUMIN  10/18/2014  . HEMOGLOBIN A1C  12/07/2014  . COLONOSCOPY  01/12/2015  . FOOT EXAM  06/08/2015  . OPHTHALMOLOGY EXAM  11/21/2015  . INFLUENZA VACCINE  01/08/2016  . MAMMOGRAM  11/23/2016  . DEXA SCAN  01/29/2017  . TETANUS/TDAP  06/07/2022  . PNA vac Low Risk Adult  Completed      Plan:   End of life planning discussed; Advance aging; Advanced directives; plans to return a copy of HCPOA/Living form at follow up visit.  Return in 1 week for left shoulder pain with PCP  Return in 1 year for annual wellness visit with Health Coach  During the course of the visit, Arna was educated and counseled about the following appropriate screening and preventive services:   Vaccines to include Pneumoccal, Influenza, Hepatitis B, Td, Zostavax, HCV  Electrocardiogram  Cardiovascular disease screening  Colorectal cancer screening  Bone density screening  Diabetes screening  Glaucoma screening  Mammography/PAP  Nutrition counseling  Smoking cessation counseling  Patient Instructions (the written plan) were given to the patient.    Varney Biles, LPN   [id number]

## 2015-04-18 NOTE — Progress Notes (Signed)
  I have reviewed the above information and agree with above.   Carolene Gitto, MD 

## 2015-04-25 ENCOUNTER — Encounter: Payer: Self-pay | Admitting: Internal Medicine

## 2015-04-25 ENCOUNTER — Ambulatory Visit (INDEPENDENT_AMBULATORY_CARE_PROVIDER_SITE_OTHER): Payer: Medicare Other | Admitting: Internal Medicine

## 2015-04-25 VITALS — BP 102/64 | HR 71 | Temp 97.7°F | Wt 161.0 lb

## 2015-04-25 DIAGNOSIS — Z1159 Encounter for screening for other viral diseases: Secondary | ICD-10-CM

## 2015-04-25 DIAGNOSIS — M25512 Pain in left shoulder: Secondary | ICD-10-CM | POA: Diagnosis not present

## 2015-04-25 DIAGNOSIS — R252 Cramp and spasm: Secondary | ICD-10-CM | POA: Diagnosis not present

## 2015-04-25 LAB — MAGNESIUM: Magnesium: 1.9 mg/dL (ref 1.5–2.5)

## 2015-04-25 NOTE — Progress Notes (Signed)
Subjective:  Patient ID: Megan Bradley, female    DOB: 04-23-1941  Age: 74 y.o. MRN: [id number]  CC: The primary encounter diagnosis was Muscle cramps. Diagnoses of Need for hepatitis C screening test and Shoulder pain, left were also pertinent to this visit.  HPI Megan Bradley presents for treatment of persistent left shoulder pain.  She was offered a subacromial steroid ijection 2 weeks ago but deferred.  She has reconsidered and has requested the injection since her pain has not resolved with anti inflammatories.  Her shoulder pain is focal and worst over deltoid rgeion ,  And aggravated by rainsg arm above the shoulder.  She can no longer internally rotate  to hook her bra behind her back. ,   Outpatient Prescriptions Prior to Visit  Medication Sig Dispense Refill  . ALPRAZolam (XANAX) 0.5 MG tablet Take 1 tablet (0.5 mg total) by mouth at bedtime as needed for anxiety. 90 tablet 1  . Cholecalciferol (VITAMIN D) 2000 UNITS CAPS Take 1 capsule by mouth daily.      Marland Kitchen ezetimibe (ZETIA) 10 MG tablet Take 1 tablet (10 mg total) by mouth daily. 90 tablet 1  . levothyroxine (SYNTHROID, LEVOTHROID) 88 MCG tablet Take 88 mcg by mouth daily.      . meclizine (ANTIVERT) 25 MG tablet TAKE ONE TABLET BY MOUTH THREE TIMES DAILY AS NEEDED 30 tablet 5  . nitroGLYCERIN (NITROSTAT) 0.4 MG SL tablet Place 1 tablet (0.4 mg total) under the tongue every 5 (five) minutes as needed for chest pain. 50 tablet 3  . omeprazole (PRILOSEC) 40 MG capsule Take 1 capsule by mouth  daily 90 capsule 1  . sertraline (ZOLOFT) 100 MG tablet Take 1 tablet (100 mg total) by mouth daily. 90 tablet 1  . fluorouracil (EFUDEX) 5 % cream Apply 1 application topically 2 (two) times daily.     No facility-administered medications prior to visit.    Review of Systems;  Patient denies headache, fevers, malaise, unintentional weight loss, skin rash, eye pain, sinus congestion and sinus pain, sore throat, dysphagia,  hemoptysis , cough,  dyspnea, wheezing, chest pain, palpitations, orthopnea, edema, abdominal pain, nausea, melena, diarrhea, constipation, flank pain, dysuria, hematuria, urinary  Frequency, nocturia, numbness, tingling, seizures,  Focal weakness, Loss of consciousness,  Tremor, insomnia, depression, anxiety, and suicidal ideation.      Objective:  BP 102/64 mmHg  Pulse 71  Temp(Src) 97.7 F (36.5 C) (Oral)  Wt 161 lb (73.029 kg)  SpO2 94%  BP Readings from Last 3 Encounters:  04/25/15 102/64  04/18/15 122/62  04/09/15 118/66    Wt Readings from Last 3 Encounters:  04/25/15 161 lb (73.029 kg)  04/18/15 162 lb 9.6 oz (73.755 kg)  04/09/15 160 lb 3.2 oz (72.666 kg)    General appearance: alert, cooperative and appears stated age Back: symmetric, no curvature. ROM normal. No CVA tenderness. Lungs: clear to auscultation bilaterally Heart: regular rate and rhythm, S1, S2 normal, no murmur, click, rub or gallop Skin: Skin color, texture, turgor normal. No rashes or lesions MSK: left shoulder painful passive ROM with abduction above 180 degrees   Lab Results  Component Value Date   HGBA1C 6.8* 06/07/2014   HGBA1C 6.5 10/17/2013   HGBA1C 6.5 01/03/2013    Lab Results  Component Value Date   CREATININE 1.00* 03/02/2015   CREATININE 0.9 06/07/2014   CREATININE 1.03 10/22/2013    Lab Results  Component Value Date   WBC 6.6 03/02/2015   HGB 13.3  03/02/2015   HCT 40.0 03/02/2015   PLT 189 03/02/2015   GLUCOSE 101* 03/02/2015   CHOL 170 01/01/2015   TRIG 49.0 01/01/2015   HDL 68.90 01/01/2015   LDLCALC 91 01/01/2015   ALT 15 03/02/2015   AST 18 03/02/2015   NA 141 03/02/2015   K 4.3 03/02/2015   CL 106 03/02/2015   CREATININE 1.00* 03/02/2015   BUN 19 03/02/2015   CO2 27 03/02/2015   TSH 1.151 03/02/2015   INR 0.9 12/05/2011   HGBA1C 6.8* 06/07/2014   MICROALBUR 0.2 10/17/2013    Dg Shoulder Left  04/10/2015  CLINICAL DATA:  Arm pain. EXAM: LEFT SHOULDER - 2+ VIEW COMPARISON:  CT  chest 08/24/2011. FINDINGS: Acromioclavicular glenohumeral degenerative change. No acute bony abnormality identified. Left breast implant. IMPRESSION: Acromioclavicular and glenohumeral degenerative change. No acute abnormality. Electronically Signed   By: Marcello Moores  Register   On: 04/10/2015 14:06    Assessment & Plan:   Problem List Items Addressed This Visit    Shoulder pain, left      Informed consent for joint injection obtained.  Area was cleaned with betadine.  Subacromial bursa was injected with Kenalog and Xylocaine .  Procedure was tolerated well and shoulder was feeling less painful by the time she left the office.  She was advised to rest the arm for 48 hours , apply ice packs every 6 hours for 15 minutes, advised to call if she develops signs of infection        Relevant Medications   triamcinolone acetonide (KENALOG-40) injection 20 mg   lidocaine (XYLOCAINE) 1 % (with pres) injection 4 mL   Muscle cramps - Primary    She has been having intermittent muscle cramps involving the thighs and calves of both legs,  occurring mostly in the evening.  She is requesting mg level to be checked,  which was normal.  Reviewed medication list,  Water intake and activity history.       Relevant Orders   Magnesium (Completed)    Other Visit Diagnoses    Need for hepatitis C screening test        Relevant Orders    Hepatitis C antibody (Completed)       I am having Ms. Able maintain her levothyroxine, Vitamin D, nitroGLYCERIN, meclizine, fluorouracil, sertraline, ALPRAZolam, omeprazole, and ezetimibe. We will continue to administer triamcinolone acetonide and lidocaine.  Meds ordered this encounter  Medications  . triamcinolone acetonide (KENALOG-40) injection 20 mg    Sig:   . lidocaine (XYLOCAINE) 1 % (with pres) injection 4 mL    Sig:     There are no discontinued medications.  Follow-up: No Follow-up on file.   Crecencio Mc, MD

## 2015-04-26 ENCOUNTER — Encounter: Payer: Self-pay | Admitting: Internal Medicine

## 2015-04-26 LAB — HEPATITIS C ANTIBODY: HCV Ab: NEGATIVE

## 2015-04-28 DIAGNOSIS — R252 Cramp and spasm: Secondary | ICD-10-CM | POA: Insufficient documentation

## 2015-04-28 MED ORDER — LIDOCAINE HCL 1 % IJ SOLN: 4.0000 mL | Freq: Once | INTRAMUSCULAR | Status: DC

## 2015-04-28 MED ORDER — TRIAMCINOLONE ACETONIDE 40 MG/ML IJ SUSP: 20.0000 mg | Freq: Once | INTRAMUSCULAR | Status: DC

## 2015-04-28 NOTE — Assessment & Plan Note (Signed)
She has been having intermittent muscle cramps involving the thighs and calves of both legs,  occurring mostly in the evening.  She is requesting mg level to be checked,  which was normal.  Reviewed medication list,  Water intake and activity history.

## 2015-04-28 NOTE — Assessment & Plan Note (Signed)
   Informed consent for joint injection obtained.  Area was cleaned with betadine.  Subacromial bursa was injected with Kenalog and Xylocaine .  Procedure was tolerated well and shoulder was feeling less painful by the time she left the office.  She was advised to rest the arm for 48 hours , apply ice packs every 6 hours for 15 minutes, advised to call if she develops signs of infection   

## 2015-05-08 DIAGNOSIS — R490 Dysphonia: Secondary | ICD-10-CM | POA: Diagnosis not present

## 2015-05-08 DIAGNOSIS — I4510 Unspecified right bundle-branch block: Secondary | ICD-10-CM | POA: Diagnosis not present

## 2015-05-08 DIAGNOSIS — E042 Nontoxic multinodular goiter: Secondary | ICD-10-CM | POA: Diagnosis not present

## 2015-05-08 DIAGNOSIS — F419 Anxiety disorder, unspecified: Secondary | ICD-10-CM | POA: Diagnosis not present

## 2015-05-08 DIAGNOSIS — K148 Other diseases of tongue: Secondary | ICD-10-CM | POA: Diagnosis not present

## 2015-05-08 DIAGNOSIS — I517 Cardiomegaly: Secondary | ICD-10-CM | POA: Diagnosis not present

## 2015-05-15 DIAGNOSIS — Z85828 Personal history of other malignant neoplasm of skin: Secondary | ICD-10-CM | POA: Diagnosis not present

## 2015-05-15 DIAGNOSIS — K219 Gastro-esophageal reflux disease without esophagitis: Secondary | ICD-10-CM | POA: Diagnosis not present

## 2015-05-15 DIAGNOSIS — E119 Type 2 diabetes mellitus without complications: Secondary | ICD-10-CM | POA: Diagnosis not present

## 2015-05-15 DIAGNOSIS — F329 Major depressive disorder, single episode, unspecified: Secondary | ICD-10-CM | POA: Diagnosis not present

## 2015-05-15 DIAGNOSIS — G4733 Obstructive sleep apnea (adult) (pediatric): Secondary | ICD-10-CM | POA: Diagnosis not present

## 2015-05-15 DIAGNOSIS — E0789 Other specified disorders of thyroid: Secondary | ICD-10-CM | POA: Diagnosis not present

## 2015-05-15 DIAGNOSIS — Z885 Allergy status to narcotic agent status: Secondary | ICD-10-CM | POA: Diagnosis not present

## 2015-05-15 DIAGNOSIS — F419 Anxiety disorder, unspecified: Secondary | ICD-10-CM | POA: Diagnosis not present

## 2015-05-15 DIAGNOSIS — Z881 Allergy status to other antibiotic agents status: Secondary | ICD-10-CM | POA: Diagnosis not present

## 2015-05-15 DIAGNOSIS — E042 Nontoxic multinodular goiter: Secondary | ICD-10-CM | POA: Diagnosis not present

## 2015-05-15 DIAGNOSIS — Z888 Allergy status to other drugs, medicaments and biological substances status: Secondary | ICD-10-CM | POA: Diagnosis not present

## 2015-05-15 DIAGNOSIS — Z79899 Other long term (current) drug therapy: Secondary | ICD-10-CM | POA: Diagnosis not present

## 2015-05-15 DIAGNOSIS — E785 Hyperlipidemia, unspecified: Secondary | ICD-10-CM | POA: Diagnosis not present

## 2015-05-15 HISTORY — PX: THYROIDECTOMY: SHX17

## 2015-05-16 DIAGNOSIS — E0789 Other specified disorders of thyroid: Secondary | ICD-10-CM | POA: Diagnosis not present

## 2015-05-16 DIAGNOSIS — F419 Anxiety disorder, unspecified: Secondary | ICD-10-CM | POA: Diagnosis not present

## 2015-05-16 DIAGNOSIS — G4733 Obstructive sleep apnea (adult) (pediatric): Secondary | ICD-10-CM | POA: Diagnosis not present

## 2015-05-16 DIAGNOSIS — Z885 Allergy status to narcotic agent status: Secondary | ICD-10-CM | POA: Diagnosis not present

## 2015-05-16 DIAGNOSIS — E042 Nontoxic multinodular goiter: Secondary | ICD-10-CM | POA: Diagnosis not present

## 2015-05-16 DIAGNOSIS — K219 Gastro-esophageal reflux disease without esophagitis: Secondary | ICD-10-CM | POA: Diagnosis not present

## 2015-05-16 DIAGNOSIS — Z85828 Personal history of other malignant neoplasm of skin: Secondary | ICD-10-CM | POA: Diagnosis not present

## 2015-05-16 DIAGNOSIS — Z888 Allergy status to other drugs, medicaments and biological substances status: Secondary | ICD-10-CM | POA: Diagnosis not present

## 2015-05-16 DIAGNOSIS — E785 Hyperlipidemia, unspecified: Secondary | ICD-10-CM | POA: Diagnosis not present

## 2015-05-16 DIAGNOSIS — Z881 Allergy status to other antibiotic agents status: Secondary | ICD-10-CM | POA: Diagnosis not present

## 2015-05-16 DIAGNOSIS — Z79899 Other long term (current) drug therapy: Secondary | ICD-10-CM | POA: Diagnosis not present

## 2015-05-16 DIAGNOSIS — E119 Type 2 diabetes mellitus without complications: Secondary | ICD-10-CM | POA: Diagnosis not present

## 2015-05-16 DIAGNOSIS — F329 Major depressive disorder, single episode, unspecified: Secondary | ICD-10-CM | POA: Diagnosis not present

## 2015-05-20 ENCOUNTER — Encounter: Payer: Self-pay | Admitting: Internal Medicine

## 2015-05-21 ENCOUNTER — Other Ambulatory Visit: Payer: Self-pay | Admitting: Internal Medicine

## 2015-05-25 DIAGNOSIS — E042 Nontoxic multinodular goiter: Secondary | ICD-10-CM | POA: Diagnosis not present

## 2015-06-15 ENCOUNTER — Other Ambulatory Visit: Payer: Self-pay | Admitting: Internal Medicine

## 2015-06-26 DIAGNOSIS — R7303 Prediabetes: Secondary | ICD-10-CM | POA: Diagnosis not present

## 2015-06-26 DIAGNOSIS — E890 Postprocedural hypothyroidism: Secondary | ICD-10-CM | POA: Diagnosis not present

## 2015-06-29 ENCOUNTER — Encounter: Payer: Self-pay | Admitting: Internal Medicine

## 2015-06-29 ENCOUNTER — Ambulatory Visit (INDEPENDENT_AMBULATORY_CARE_PROVIDER_SITE_OTHER): Payer: Medicare Other | Admitting: Internal Medicine

## 2015-06-29 VITALS — BP 114/64 | HR 81 | Temp 98.5°F | Resp 12 | Ht 66.0 in | Wt 157.0 lb

## 2015-06-29 DIAGNOSIS — E032 Hypothyroidism due to medicaments and other exogenous substances: Secondary | ICD-10-CM

## 2015-06-29 DIAGNOSIS — R7303 Prediabetes: Secondary | ICD-10-CM | POA: Diagnosis not present

## 2015-06-29 DIAGNOSIS — E042 Nontoxic multinodular goiter: Secondary | ICD-10-CM

## 2015-06-29 DIAGNOSIS — R9431 Abnormal electrocardiogram [ECG] [EKG]: Secondary | ICD-10-CM

## 2015-06-29 MED ORDER — GLUCOSE BLOOD VI STRP: ORAL_STRIP | Status: DC

## 2015-06-29 MED ORDER — LEVOTHYROXINE SODIUM 112 MCG PO TABS: 112.0000 ug | ORAL_TABLET | Freq: Every day | ORAL | Status: DC

## 2015-06-29 NOTE — Patient Instructions (Addendum)
We can recheck your TSH and Free t4 in 6 weeks after you start the new bottle   I can reduce your breakfast carbs dramatically with the following:  Danton Clap now makes a frozen breakfast frittata that can be microwaved in 2 minutes and is very low carb. Frittats are similar to quiches without the crust   Joseph's makes low carb flatbread and pita bread, 4-5 net carbs,  They toast well to substitute for [employer] chips and for sandwiches   You have a viral syndrome which is causing bronchitis.    The post nasal drip may alsoe be contributing to  your  Cough.   I also advise use of the following OTC meds to help with your other symptoms.   Take generic OTC benadryl 25 mg  At bedtime for the drainage,,  Delsym for daytime cough. Sudafed PE 10 to 30 mg every 6 hours for the congestion , last dose by 3 pm to avoid insomnia,  Use afrin at night if needed.   Oikos Triple Zero salted caramel and Dannon  Lnt n Fit Greek YoGurt in [place],  BOTHER ARE 8 CARBS!  TOP WITH YOGURT   flush your sinuses twice daily with Simply Saline    This is  my version of a  "Low GI"  Diet:  It will still lower your blood sugars and allow you to lose 4 to 8  lbs  per month if you follow it carefully.  Your goal with exercise is a minimum of 30 minutes of aerobic exercise 5 days per week (Walking does not count once it becomes easy!)     All of the foods can be found at grocery stores and in bulk at [employer].  The Atkins protein bars and shakes are available in more varieties at Target, [employer]Mart and [place].     7 AM Breakfast:  Choose from the following:  Low carbohydrate Protein  Shakes (I recommend the EAS AdvantEdge "Carb Control" shakes  Or the low carb shakes by Atkins.    2.5 carbs   Arnold's "Sandwhich Thin"toasted  w/ peanut butter (no jelly: about 20 net carbs  "Bagel Thin" with cream cheese and salmon: about 20 carbs   a scrambled egg/bacon/cheese burrito made with Mission's "carb balance"  whole wheat tortilla  (about 10 net carbs )  A slice of home made fritatta (egg based dish without a crust:  google it)    Avoid cereal and bananas, oatmeal and cream of wheat and grits. They are loaded with carbohydrates!   10 AM: high protein snack  Protein bar by Atkins (the snack size, under 200 cal, usually < 6 net carbs).    A stick of cheese:  Around 1 carb,  100 cal     Dannon Light n Fit [place] Yogurt  (80 cal, 8 carbs)  Other so called "protein bars" and Greek yogurts tend to be loaded with carbohydrates.  Remember, in food advertising, the word "energy" is synonymous for " carbohydrate."  Lunch:   A Sandwich using the bread choices listed, Can use any  Eggs,  lunchmeat, grilled meat or canned tuna), avocado, regular mayo/mustard  and cheese.  A Salad using blue cheese, ranch,  Goddess or vinagrette,  No croutons or "confetti" and no "candied nuts" but regular nuts OK.   No pretzels or chips.  Pickles and miniature sweet peppers are a good low carb alternative that provide a "crunch"  The bread is the only  source of carbohydrate in a sandwich and  can be decreased by trying some of these alternatives to traditional loaf bread:  Joseph's makes a pita bread and a flat bread (called "Lavash") that are 50 cal and 4 net carbs available at [place], [employer] and [employer].  This can be toasted to use with hummous as well  Toufayan makes a low carb flatbread that's 100 cal and 9 net carbs available at [employer] and BJ's makes 2 sizes of  Low carb whole wheat tortilla  (The large one is 210 cal and 6 net carbs)  Flat Out makes flatbreads that are low carb as well  aa folded flatbread called "Foldit" available at [place] "Low fat dressings, as well as [place] dressings They are loaded with sugar.  Ranch,  [employer], Goddess,  vinagrettes are all fine   3 PM/ Mid day  Snack:  Consider  1 ounce of  almonds, walnuts, pistachios, pecans, peanuts,   Macadamia nuts or a nut medley.  Avoid "granola"; the dried cranberries and raisins are loaded with carbohydrates. Mixed nuts as long as there are no raisins,  cranberries or dried fruit.    Try the prosciutto/mozzarella cheese sticks by Fiorruci  In deli /backery section   High protein   To avoid overindulging in snacks: Try drinking a glass of almond coconut milk light by [place] ( Or a cup of coffee with your Atkins chocolate bar to keep you from having 3!!!   Pork rinds!  Yes Pork Rinds        6 PM  Dinner:     Meat/fowl/fish with a green salad, and either broccoli, cauliflower, green beans, spinach, brussel sprouts or  Lima beans. DO NOT BREAD THE PROTEIN!!      There is a low carb pasta by Dreamfield's that is acceptable and tastes great: only 5 digestible carbs/serving.( All grocery stores but BJs carry it )  There are frozen dinners that are low carb:  Try Michel Angelo's chicken piccata or chicken or eggplant parm over low carb pasta.(Lowes and BJs)   Marjory Lies Sanchez's "Carnitas" (pulled pork, no sauce,  0 carbs) or his beef pot roast to make a dinner burrito (at BJ's)  Pesto over low carb pasta (bj's sells a good quality pesto in the center refrigerated section of the deli   Try satueeing  Cheral Marker with mushroooms  Whole wheat pasta is still full of digestible carbs and  Not as low in glycemic index as Dreamfield's.   Brown rice is still rice,  So skip the rice and noodles if you eat [place] or [place] (or at least limit to 1/2 cup)  9 PM snack :   Breyer's "low carb" fudgsicle or  ice cream bar (Carb Smart line), or  Weight Watcher's ice cream bar , or another "no sugar added" ice cream;  a serving of fresh berries/cherries with whipped cream   Cheese or DANNON'S LlGHT N FIT GREEK YOGURT or the Oikos greek yogurt   8 ounces of [place] unsweetened almond/cococunut milk  Cheese and crackers (using WASA crackers,  They are low carb) or peanut butter on low carb crackers or pita bread      Avoid bananas, pineapple, grapes  and watermelon on a regular basis because they are high in sugar.  THINK OF THEM AS DESSERT  Remember that snack Substitutions should be less than 10 NET carbs per serving and meals should be <  25 net carbs. Remember that carbohydrates from fiber do not affect blood sugar, so you can  subtract fiber grams to get the "net carbs " of any particular food item.  If the coughing has not impoved in  48 hours  OR  if you develop T > 100.4,  Green nasal discharge,  Or facial pain, start the antibiotic.  Please take a probiotic ( Align, Floraque or Culturelle) while you are on the antibiotic to prevent a serious antibiotic associated diarrhea  Called clostridium dificile colitis and a vaginal yeast infection

## 2015-06-29 NOTE — Progress Notes (Signed)
Subjective:  Patient ID: Megan Bradley, female    DOB: Jan 03, 1941  Age: 75 y.o. MRN: [id number]  CC: The primary encounter diagnosis was Prediabetes. Diagnoses of Multinodular goiter, Iatrogenic hypothyroidism, and Nonspecific abnormal electrocardiogram (ECG) (EKG) were also pertinent to this visit.  HPI ROSHANNA Bradley presents for follow up on multiple issues.  She is transferring Endocrinology care to me after becoming dissatsfied by her last encounter with her endocrinologist.     1) persistent cough for the last 3 days. The cough is nonproductive and unaccompanied by fevers, pleurisy and sinus pain.   2) She is s/p  total thyroidectomy done at [facility] for compressive tracheal symptoms caused by  MNG .  TSH  Was 1.53 and T4 1.13 in May 08 2015 , post initiation of thyroid replacement her TSH was 0.178 and Free T4 1.62 this week   3) Prediabetes: :  Her last A1c was 6.4 at [facility]. fastings 100 to 120  .   4) She has an appt with Dr Rockey Situ to follow up on abnormal EKG   5) Viral URI x 3  Shoulder improved after 5 days post steroid injection. reinjured unfortunately after tripping over a parking lot pylon.   Outpatient Prescriptions Prior to Visit  Medication Sig Dispense Refill  . ALPRAZolam (XANAX) 0.5 MG tablet Take 1 tablet (0.5 mg total) by mouth at bedtime as needed for anxiety. 90 tablet 1  . Cholecalciferol (VITAMIN D) 2000 UNITS CAPS Take 1 capsule by mouth daily.      Marland Kitchen ezetimibe (ZETIA) 10 MG tablet Take 1 tablet (10 mg total) by mouth daily. 90 tablet 1  . meclizine (ANTIVERT) 25 MG tablet TAKE ONE TABLET BY MOUTH THREE TIMES DAILY AS NEEDED 30 tablet 5  . nitroGLYCERIN (NITROSTAT) 0.4 MG SL tablet Place 1 tablet (0.4 mg total) under the tongue every 5 (five) minutes as needed for chest pain. 50 tablet 3  . omeprazole (PRILOSEC) 40 MG capsule Take 1 capsule by mouth  daily 90 capsule 2  . sertraline (ZOLOFT) 100 MG tablet Take 1 tablet by mouth  daily 90 tablet 2  . fluorouracil  (EFUDEX) 5 % cream Apply 1 application topically 2 (two) times daily. Reported on 06/29/2015    . levothyroxine (SYNTHROID, LEVOTHROID) 88 MCG tablet Take 112 mcg by mouth daily.      Facility-Administered Medications Prior to Visit  Medication Dose Route Frequency Provider Last Rate Last Dose  . lidocaine (XYLOCAINE) 1 % (with pres) injection 4 mL  4 mL Other Once Crecencio Mc, MD      . triamcinolone acetonide (KENALOG-40) injection 20 mg  20 mg Intra-articular Once Crecencio Mc, MD        Review of Systems;  Patient denies headache, fevers, malaise, unintentional weight loss, skin rash, eye pain, sinus congestion and sinus pain, sore throat, dysphagia,  hemoptysis , cough, dyspnea, wheezing, chest pain, palpitations, orthopnea, edema, abdominal pain, nausea, melena, diarrhea, constipation, flank pain, dysuria, hematuria, urinary  Frequency, nocturia, numbness, tingling, seizures,  Focal weakness, Loss of consciousness,  Tremor, insomnia, depression, anxiety, and suicidal ideation.      Objective:  BP 114/64 mmHg  Pulse 81  Temp(Src) 98.5 F (36.9 C) (Oral)  Resp 12  Ht 5\' 6"  (1.676 m)  Wt 157 lb (71.215 kg)  BMI 25.35 kg/m2  SpO2 97%  BP Readings from Last 3 Encounters:  06/29/15 114/64  04/25/15 102/64  04/18/15 122/62    Wt Readings from Last 3  Encounters:  06/29/15 157 lb (71.215 kg)  04/25/15 161 lb (73.029 kg)  04/18/15 162 lb 9.6 oz (73.755 kg)    General appearance: alert, cooperative and appears stated age Ears: normal TM's and external ear canals both ears Throat: lips, mucosa, and tongue normal; teeth and gums normal Neck: no adenopathy, no carotid bruit, supple, symmetrical, trachea midline and thyroid not enlarged, symmetric, no tenderness/mass/nodules Back: symmetric, no curvature. ROM normal. No CVA tenderness. Lungs: clear to auscultation bilaterally Heart: regular rate and rhythm, S1, S2 normal, no murmur, click, rub or gallop Abdomen: soft,  non-tender; bowel sounds normal; no masses,  no organomegaly Pulses: 2+ and symmetric Skin: Skin color, texture, turgor normal. No rashes or lesions Lymph nodes: Cervical, supraclavicular, and axillary nodes normal.  Lab Results  Component Value Date   HGBA1C 6.8* 06/07/2014   HGBA1C 6.5 10/17/2013   HGBA1C 6.5 01/03/2013    Lab Results  Component Value Date   CREATININE 1.00* 03/02/2015   CREATININE 0.9 06/07/2014   CREATININE 1.03 10/22/2013    Lab Results  Component Value Date   WBC 6.6 03/02/2015   HGB 13.3 03/02/2015   HCT 40.0 03/02/2015   PLT 189 03/02/2015   GLUCOSE 101* 03/02/2015   CHOL 170 01/01/2015   TRIG 49.0 01/01/2015   HDL 68.90 01/01/2015   LDLCALC 91 01/01/2015   ALT 15 03/02/2015   AST 18 03/02/2015   NA 141 03/02/2015   K 4.3 03/02/2015   CL 106 03/02/2015   CREATININE 1.00* 03/02/2015   BUN 19 03/02/2015   CO2 27 03/02/2015   TSH 1.151 03/02/2015   INR 0.9 12/05/2011   HGBA1C 6.8* 06/07/2014   MICROALBUR 0.2 10/17/2013    Dg Shoulder Left  04/10/2015  CLINICAL DATA:  Arm pain. EXAM: LEFT SHOULDER - 2+ VIEW COMPARISON:  CT chest 08/24/2011. FINDINGS: Acromioclavicular glenohumeral degenerative change. No acute bony abnormality identified. Left breast implant. IMPRESSION: Acromioclavicular and glenohumeral degenerative change. No acute abnormality. Electronically Signed   By: Marcello Moores  Register   On: 04/10/2015 14:06    Assessment & Plan:   Problem List Items Addressed This Visit    Iatrogenic hypothyroidism   Relevant Medications   levothyroxine (SYNTHROID, LEVOTHROID) 112 MCG tablet   Prediabetes - Primary    Low GI diet discussed with patient today. repeat in 6 month s      Relevant Orders   POCT Glucose (Device for Home Use)   Multinodular goiter    S/p total thyroidectomy for compressive symptoms.  Thyroid replacement started.  Discussed name brand vs generic,  She is willing to incur the expense of NB if necessary to keep thyroid  function stable.  Will recheck TSH and T4 after 6 weeks of generic.       Relevant Medications   levothyroxine (SYNTHROID, LEVOTHROID) 112 MCG tablet   Nonspecific abnormal electrocardiogram (ECG) (EKG)    Apparently her preoperative EKG was abnormal at [facility], and patient has been scheduled with Dr Rockey Situ for further evaluation . EKG is not visible on [facility] web site.        A total of 25 minutes of face to face time was spent with patient more than half of which was spent in counselling about the above mentioned conditions  and coordination of care   I have discontinued Ms. Ragan's levothyroxine and fluorouracil. I have also changed her levothyroxine. Additionally, I am having her start on glucose blood. Lastly, I am having her maintain her Vitamin D, nitroGLYCERIN, meclizine, ALPRAZolam, ezetimibe,  sertraline, and omeprazole. We will continue to administer triamcinolone acetonide and lidocaine.  Meds ordered this encounter  Medications  . DISCONTD: levothyroxine (SYNTHROID, LEVOTHROID) 112 MCG tablet    Sig: Take 112 mcg by mouth daily before breakfast.   . levothyroxine (SYNTHROID, LEVOTHROID) 112 MCG tablet    Sig: Take 1 tablet (112 mcg total) by mouth daily before breakfast.    Dispense:  90 tablet    Refill:  2  . glucose blood test strip    Sig: For One touch Verio Flex .  Use to check 3 times daily   e11.9    Dispense:  100 each    Refill:  0    Medications Discontinued During This Encounter  Medication Reason  . fluorouracil (EFUDEX) 5 % cream Completed Course  . levothyroxine (SYNTHROID, LEVOTHROID) 88 MCG tablet Change in therapy  . levothyroxine (SYNTHROID, LEVOTHROID) 112 MCG tablet Reorder    Follow-up: Return in about 3 months (around 09/27/2015) for follow up diabetes.   Crecencio Mc, MD

## 2015-06-29 NOTE — Progress Notes (Signed)
Pre-visit discussion using our clinic review tool. No additional management support is needed unless otherwise documented below in the visit note.  

## 2015-06-30 ENCOUNTER — Encounter: Payer: Self-pay | Admitting: Internal Medicine

## 2015-07-01 DIAGNOSIS — R9431 Abnormal electrocardiogram [ECG] [EKG]: Secondary | ICD-10-CM | POA: Insufficient documentation

## 2015-07-01 NOTE — Assessment & Plan Note (Signed)
Apparently her preoperative EKG was abnormal at [facility], and patient has been scheduled with Dr Rockey Situ for further evaluation . EKG is not visible on [facility] web site.

## 2015-07-01 NOTE — Assessment & Plan Note (Signed)
S/p total thyroidectomy for compressive symptoms.  Thyroid replacement started.  Discussed name brand vs generic,  She is willing to incur the expense of NB if necessary to keep thyroid function stable.  Will recheck TSH and T4 after 6 weeks of generic.

## 2015-07-01 NOTE — Assessment & Plan Note (Signed)
Low GI diet discussed with patient today. repeat in 6 month s

## 2015-07-04 ENCOUNTER — Encounter: Payer: Self-pay | Admitting: Internal Medicine

## 2015-07-04 DIAGNOSIS — Z85828 Personal history of other malignant neoplasm of skin: Secondary | ICD-10-CM | POA: Diagnosis not present

## 2015-07-04 DIAGNOSIS — L570 Actinic keratosis: Secondary | ICD-10-CM | POA: Diagnosis not present

## 2015-07-05 ENCOUNTER — Other Ambulatory Visit: Payer: Self-pay | Admitting: Internal Medicine

## 2015-07-09 ENCOUNTER — Encounter: Payer: Self-pay | Admitting: Internal Medicine

## 2015-07-10 ENCOUNTER — Ambulatory Visit (INDEPENDENT_AMBULATORY_CARE_PROVIDER_SITE_OTHER): Payer: Medicare Other | Admitting: Nurse Practitioner

## 2015-07-10 ENCOUNTER — Encounter: Payer: Self-pay | Admitting: Nurse Practitioner

## 2015-07-10 VITALS — BP 130/82 | HR 72 | Ht 65.5 in | Wt 156.4 lb

## 2015-07-10 DIAGNOSIS — R079 Chest pain, unspecified: Secondary | ICD-10-CM | POA: Diagnosis not present

## 2015-07-10 DIAGNOSIS — I4510 Unspecified right bundle-branch block: Secondary | ICD-10-CM | POA: Diagnosis not present

## 2015-07-10 DIAGNOSIS — R7303 Prediabetes: Secondary | ICD-10-CM | POA: Insufficient documentation

## 2015-07-10 DIAGNOSIS — I951 Orthostatic hypotension: Secondary | ICD-10-CM | POA: Insufficient documentation

## 2015-07-10 NOTE — Patient Instructions (Addendum)
Medication Instructions:  Please continue current medications  Labwork: None  Testing/Procedures: None  Follow-Up: Your physician wants you to follow-up in: 1 year You will receive a reminder letter in the mail two months in advance.  If you don't receive a letter, please call our office to schedule the follow-up appointment.  If you need a refill on your cardiac medications before your next appointment, please call your pharmacy.

## 2015-07-10 NOTE — Progress Notes (Signed)
Cardiology Clinic Note   Patient Name: Megan Bradley Date of Encounter: 07/10/2015  Primary Care Provider:  Crecencio Mc, MD Primary Cardiologist:  Johnny Bridge, MD (last seen 2013)  Patient Profile    75 year old female with a history of chest pain, multinodular goiter, prediabetes, and right bundle branch block, who presents to reestablish cardiology care.  Past Medical History    Past Medical History  Diagnosis Date  . Vertigo   . Herpes zoster   . Anxiety disorder   . Orthostatic hypotension   . Chest pain     a. 2006 or 2007 Cath [facility]): reportedly nl cors;  b. 09/2011 ETT: nl.  . Hypothyroidism   . Pre-diabetes   . Anxiety   . GERD (gastroesophageal reflux disease)   . Cervical dysplasia     a. 2010 - high grade squamous intraepithelial lesion s/p excision.  . RBBB    Past Surgical History  Procedure Laterality Date  . Cardiac catheterization  2003    Dr.Callwood, R/L heart cath,  . Thyroidectomy  05/15/15    [facility]    Allergies  Allergies  Allergen Reactions  . Atorvastatin Other (See Comments)    Other Reaction: OTHER REACTION  . Codeine     GI problems  . Fentanyl Nausea Only  . Lipitor [Atorvastatin Calcium]     Muscle cramps and weakness  . Tetracyclines & Related Hives    History of Present Illness    75 year old female with the above past medical history. She has a history of chest pain that dates back to sometime around 2006 or 2007. At that time, she was seen by Dr. Clayborn Bigness and reportedly underwent diagnostic catheterization which was apparently normal. She continued to have intermittent chest pain and was subsequently referred to Dr. Dorene Grebe at [facility] She saw her a few times in 2007 and based on available documenting care ever, it appears that she was mostly managed for hypertension. [name] says that eventually, it was determined that she was experiencing esophageal spasm and has ever since kept some little nitroglycerin with her, though  she has not had any significant episodes of chest pain in quite some time. Her last big episode of chest pain was in 2013, when she was evaluated in the [facility] emergency room with normal cardiac markers and normal CT of her chest. She subsequently followed up with Dr. Rockey Situ and underwent exercise treadmill testing, which was normal. She has not been seen in our office since then. Over the past few years, she has not been experiencing any chest pain. She is active but does not routinely exercise. That said, she feels as though she is able to perform the activities that she enjoys performing without any significant limitations and denies chest pain or dyspnea.  In late 2016, she was evaluated by surgery secondary to multinodular goiter with tracheal compression and associated cough and difficulty swallowing. Decision was made to go forward with surgery. She was evaluated by anesthesia at [facility] on November 29 and an ECG was performed and read out as abnormal secondary to a right bundle branch block with left axis deviation and left atrial enlargement. This was apparently reviewed by anesthesia there and surgery went on as planned in early December. Because of the abnormality noted on ECG, [name] wished to reestablish cardiology care to ensure that things are stable. Of note, she has had a right bundle branch block with left anterior fascicular block dating back to at  least January 2012 in our system. She has no prior history of presyncope or syncope.  Home Medications    Prior to Admission medications   Medication Sig Start Date End Date Taking? Authorizing Provider  ALPRAZolam Duanne Moron) 0.5 MG tablet Take 1 tablet (0.5 mg total) by mouth at bedtime as needed for anxiety. 01/01/15  Yes Crecencio Mc, MD  Cholecalciferol (VITAMIN D) 2000 UNITS CAPS Take 1 capsule by mouth daily.     Yes Historical Provider, MD  ezetimibe (ZETIA) 10 MG tablet Take 1 tablet (10 mg total) by mouth daily.  04/02/15  Yes Crecencio Mc, MD  glucose blood test strip For One touch Verio Flex .  Use to check 3 times daily   e11.9 06/29/15  Yes Crecencio Mc, MD  levothyroxine (SYNTHROID, LEVOTHROID) 112 MCG tablet Take 1 tablet (112 mcg total) by mouth daily before breakfast. 06/29/15  Yes Crecencio Mc, MD  meclizine (ANTIVERT) 25 MG tablet TAKE ONE TABLET BY MOUTH THREE TIMES DAILY AS NEEDED 03/06/14  Yes Crecencio Mc, MD  nitroGLYCERIN (NITROSTAT) 0.4 MG SL tablet Place 1 tablet (0.4 mg total) under the tongue every 5 (five) minutes as needed for chest pain. 01/11/13  Yes Crecencio Mc, MD  omeprazole (PRILOSEC) 40 MG capsule Take 1 capsule by mouth  daily 06/15/15  Yes Crecencio Mc, MD  sertraline (ZOLOFT) 100 MG tablet Take 1 tablet by mouth  daily 05/21/15  Yes Crecencio Mc, MD    Family History    Family History  Problem Relation Age of Onset  . Heart attack Mother   . Alcohol abuse Mother   . Mental illness Mother     died in her 28's of alzheimer's Dementia  . Heart disease Mother 47  . Heart attack Father   . Heart disease Father 81    AMI - died @ 67.  . Colon cancer Paternal Grandmother 24    Social History    Social History   Social History  . Marital Status: Married    Spouse Name: N/A  . Number of Children: N/A  . Years of Education: N/A   Occupational History  . Not on file.   Social History Main Topics  . Smoking status: Never Smoker   . Smokeless tobacco: Never Used  . Alcohol Use: Yes     Comment: wine once a week  . Drug Use: No  . Sexual Activity: No   Other Topics Concern  . Not on file   Social History Narrative   Lives in [place].  Active but doesn't exercise. Retired from [employer]:  No chills, fever, night sweats or weight changes.  Cardiovascular:  No chest pain, dyspnea on exertion, edema, orthopnea, palpitations, paroxysmal nocturnal dyspnea. Dermatological: No rash, lesions/masses Respiratory: Positive  cough-which has improved since her thyroid surgery in December. No dyspnea Urologic: No hematuria, dysuria Abdominal:   She has had dysphagia in the setting of nodular goiter, which has improved some since her surgery in December. No nausea, vomiting, diarrhea, bright red blood per rectum, melena, or hematemesis Neurologic:  No visual changes, wkns, changes in mental status. All other systems reviewed and are otherwise negative except as noted above.  Physical Exam    VS:  BP 130/82 mmHg  Pulse 72  Ht 5' 5.5" (1.664 m)  Wt 156 lb 6.4 oz (70.943 kg)  BMI 25.62 kg/m2 , BMI Body mass index  is 25.62 kg/(m^2). GEN: Well nourished, well developed, in no acute distress. HEENT: normal. Neck: Supple, no JVD, carotid bruits, or masses. Cardiac: RRR, no murmurs, rubs, or gallops. No clubbing, cyanosis, edema.  Radials/DP/PT 2+ and equal bilaterally.  Respiratory:  Respirations regular and unlabored, clear to auscultation bilaterally. GI: Soft, nontender, nondistended, BS + x 4. MS: no deformity or atrophy. Skin: warm and dry, no rash. Neuro:  Strength and sensation are intact. Psych: Normal affect.  Accessory Clinical Findings    ECG - regular sinus rhythm, 72, right bundle branch block, left axis deviation, left anterior fascicular block, no acute ST or T changes and ECG overall unchanged since January 2012.  Assessment & Plan   1.  Right bundle branch block: Patient was told at [facility] that she had an abnormal ECG. She has a history of right bundle branch block with left axis deviation and left anterior fascicular block dating back to January 2012 in our records. She does not have any significant heart block and also denies a history of presyncope or syncope. We discussed that if she were to develop presyncope, we would likely want to place a monitor to assess for heart rates of heart block. She has a history of normal LV function by prior echo.  2. History of chest pain: She has had extensive  cardiac workup in the past including remote catheterization, echo, and stress testing in April 2013. Ultimately, it appears that she was diagnosed with esophageal spasm and symptoms have been stable over the past several years. She has not needed to take when necessary nitrates in some time.  3. Prediabetes: This is followed closely by her primary care provider.  4. Hyperlipidemia: Most recent LDL was 99 in July 2016 with normal LFTs in September 2016. She is intolerant to statins and is currently taking Zetia.  5. Multinodular Goiter:  Complicated by tracheal compression with cough and difficulty swallowing.  S/p thyroidectomy @ [facility] in December.  She is doing well post-op with some improvement in Ss.  She is on synthroid and followed closely by Dr. Derrel Nip.  6.  Disposition: Follow-up with Dr. Rockey Situ in one year or sooner if necessary.  Murray Hodgkins, NP 07/10/2015, 5:02 PM

## 2015-07-12 ENCOUNTER — Encounter: Payer: Self-pay | Admitting: Internal Medicine

## 2015-07-12 MED ORDER — NITROGLYCERIN 0.4 MG SL SUBL: 0.4000 mg | SUBLINGUAL_TABLET | SUBLINGUAL | Status: DC | PRN

## 2015-07-12 NOTE — Telephone Encounter (Signed)
I have rfilled medication just for FYI.

## 2015-07-17 ENCOUNTER — Other Ambulatory Visit: Payer: Self-pay

## 2015-07-17 MED ORDER — ALPRAZOLAM 0.5 MG PO TABS: 0.5000 mg | ORAL_TABLET | Freq: Every evening | ORAL | Status: DC | PRN

## 2015-08-18 DIAGNOSIS — R079 Chest pain, unspecified: Secondary | ICD-10-CM | POA: Diagnosis not present

## 2015-09-13 ENCOUNTER — Other Ambulatory Visit: Payer: Self-pay

## 2015-09-13 MED ORDER — LEVOTHYROXINE SODIUM 112 MCG PO TABS: 112.0000 ug | ORAL_TABLET | Freq: Every day | ORAL | Status: DC

## 2015-09-14 ENCOUNTER — Encounter: Payer: Self-pay | Admitting: Internal Medicine

## 2015-09-14 ENCOUNTER — Ambulatory Visit (INDEPENDENT_AMBULATORY_CARE_PROVIDER_SITE_OTHER): Payer: Medicare Other | Admitting: Internal Medicine

## 2015-09-14 VITALS — BP 126/72 | HR 66 | Temp 97.7°F | Resp 12 | Ht 65.5 in | Wt 156.8 lb

## 2015-09-14 DIAGNOSIS — M25512 Pain in left shoulder: Secondary | ICD-10-CM | POA: Diagnosis not present

## 2015-09-14 NOTE — Progress Notes (Signed)
Pre-visit discussion using our clinic review tool. No additional management support is needed unless otherwise documented below in the visit note.  

## 2015-09-14 NOTE — Progress Notes (Addendum)
Subjective:  Patient ID: Megan Bradley, female    DOB: [birth year]  Age: 75 y.o. MRN: [id number]  CC: The encounter diagnosis was Shoulder pain, left.  HPI Megan Bradley presents for follow up on recurrent  left shoulder pain, requesting a steroid injection .  Plain films were done in  November showing DJD changes at the A/C and G/h Joint.  An intra articular injection of  Kenolog 20 mg and 4.5 ml 1% Xylocaine was done in November and after 4 days was nearly pain free until she had another fall in January.   She is requesting a repeat injection .  Outpatient Prescriptions Prior to Visit  Medication Sig Dispense Refill  . ALPRAZolam (XANAX) 0.5 MG tablet Take 1 tablet (0.5 mg total) by mouth at bedtime as needed for anxiety. 90 tablet 1  . Cholecalciferol (VITAMIN D) 2000 UNITS CAPS Take 1 capsule by mouth daily.      Marland Kitchen ezetimibe (ZETIA) 10 MG tablet Take 1 tablet (10 mg total) by mouth daily. 90 tablet 1  . glucose blood test strip For One touch Verio Flex .  Use to check 3 times daily   e11.9 100 each 0  . levothyroxine (SYNTHROID, LEVOTHROID) 112 MCG tablet Take 1 tablet (112 mcg total) by mouth daily before breakfast. 90 tablet 2  . meclizine (ANTIVERT) 25 MG tablet TAKE ONE TABLET BY MOUTH THREE TIMES DAILY AS NEEDED 30 tablet 5  . nitroGLYCERIN (NITROSTAT) 0.4 MG SL tablet Place 1 tablet (0.4 mg total) under the tongue every 5 (five) minutes as needed for chest pain. 50 tablet 3  . omeprazole (PRILOSEC) 40 MG capsule Take 1 capsule by mouth  daily 90 capsule 2  . sertraline (ZOLOFT) 100 MG tablet Take 1 tablet by mouth  daily 90 tablet 2   Facility-Administered Medications Prior to Visit  Medication Dose Route Frequency Provider Last Rate Last Dose  . lidocaine (XYLOCAINE) 1 % (with pres) injection 4 mL  4 mL Other Once Crecencio Mc, MD      . triamcinolone acetonide (KENALOG-40) injection 20 mg  20 mg Intra-articular Once Crecencio Mc, MD        Review of Systems;  Patient  denies headache, fevers, malaise, unintentional weight loss, skin rash, eye pain, sinus congestion and sinus pain, sore throat, dysphagia,  hemoptysis , cough, dyspnea, wheezing, chest pain, palpitations, orthopnea, edema, abdominal pain, nausea, melena, diarrhea, constipation, flank pain, dysuria, hematuria, urinary  Frequency, nocturia, numbness, tingling, seizures,  Focal weakness, Loss of consciousness,  Tremor, insomnia, depression, anxiety, and suicidal ideation.      Objective:  BP 126/72 mmHg  Pulse 66  Temp(Src) 97.7 F (36.5 C) (Oral)  Resp 12  Ht 5' 5.5" (1.664 m)  Wt 156 lb 12 oz (71.101 kg)  BMI 25.68 kg/m2  SpO2 97%  BP Readings from Last 3 Encounters:  09/14/15 126/72  07/10/15 130/82  06/29/15 114/64    Wt Readings from Last 3 Encounters:  09/14/15 156 lb 12 oz (71.101 kg)  07/10/15 156 lb 6.4 oz (70.943 kg)  06/29/15 157 lb (71.215 kg)    General appearance: alert, cooperative and appears stated age Neck: no adenopathy, no carotid bruit, supple, symmetrical, trachea midline and thyroid not enlarged, symmetric, no tenderness/mass/nodules Back: symmetric, no curvature. ROM normal. No CVA tenderness. Lungs: clear to auscultation bilaterally Heart: regular rate and rhythm, S1, S2 normal, no murmur, click, rub or gallop MSK: left shoulder without redness or effusion.  Pain with  all ROM  Lab Results  Component Value Date   HGBA1C 6.8* 06/07/2014   HGBA1C 6.5 10/17/2013   HGBA1C 6.5 01/03/2013    Lab Results  Component Value Date   CREATININE 1.00* 03/02/2015   CREATININE 0.9 06/07/2014   CREATININE 1.03 10/22/2013    Lab Results  Component Value Date   WBC 6.6 03/02/2015   HGB 13.3 03/02/2015   HCT 40.0 03/02/2015   PLT 189 03/02/2015   GLUCOSE 101* 03/02/2015   CHOL 170 01/01/2015   TRIG 49.0 01/01/2015   HDL 68.90 01/01/2015   LDLCALC 91 01/01/2015   ALT 15 03/02/2015   AST 18 03/02/2015   NA 141 03/02/2015   K 4.3 03/02/2015   CL 106  03/02/2015   CREATININE 1.00* 03/02/2015   BUN 19 03/02/2015   CO2 27 03/02/2015   TSH 1.151 03/02/2015   INR 0.9 12/05/2011   HGBA1C 6.8* 06/07/2014   MICROALBUR 0.2 10/17/2013    Dg Shoulder Left  04/10/2015  CLINICAL DATA:  Arm pain. EXAM: LEFT SHOULDER - 2+ VIEW COMPARISON:  CT chest 08/24/2011. FINDINGS: Acromioclavicular glenohumeral degenerative change. No acute bony abnormality identified. Left breast implant. IMPRESSION: Acromioclavicular and glenohumeral degenerative change. No acute abnormality. Electronically Signed   By: Marcello Moores  Register   On: 04/10/2015 14:06    Assessment & Plan:   Problem List Items Addressed This Visit    Shoulder pain, left - Primary    Informed consent obtained. Steroid injection using 20 mg Kenolog and 4 mg Xylocaine was  given per patient request.  No immediate complications were reported or noted.  .         I am having Ms. Reihl maintain her Vitamin D, meclizine, ezetimibe, sertraline, omeprazole, glucose blood, nitroGLYCERIN, ALPRAZolam, and levothyroxine. We will continue to administer triamcinolone acetonide and lidocaine.  No orders of the defined types were placed in this encounter.    There are no discontinued medications.  Follow-up: No Follow-up on file.   Crecencio Mc, MD

## 2015-09-16 NOTE — Assessment & Plan Note (Signed)
Informed consent obtained. Steroid injection given per patient requested .

## 2015-09-18 ENCOUNTER — Encounter: Payer: Self-pay | Admitting: Internal Medicine

## 2015-09-24 ENCOUNTER — Telehealth: Payer: Self-pay | Admitting: Internal Medicine

## 2015-09-25 ENCOUNTER — Encounter: Payer: Self-pay | Admitting: Family Medicine

## 2015-09-25 ENCOUNTER — Ambulatory Visit (INDEPENDENT_AMBULATORY_CARE_PROVIDER_SITE_OTHER): Payer: Medicare Other | Admitting: Family Medicine

## 2015-09-25 VITALS — BP 122/82 | HR 67 | Temp 98.1°F | Wt 156.2 lb

## 2015-09-25 DIAGNOSIS — M25512 Pain in left shoulder: Secondary | ICD-10-CM

## 2015-09-25 DIAGNOSIS — M25511 Pain in right shoulder: Secondary | ICD-10-CM | POA: Insufficient documentation

## 2015-09-25 NOTE — Progress Notes (Signed)
Pre visit review using our clinic review tool, if applicable. No additional management support is needed unless otherwise documented below in the visit note. 

## 2015-09-25 NOTE — Assessment & Plan Note (Signed)
New problem. Suspect subacromial bursitis versus rotator cuff injury. Patient currently improving. We discussed treatment options today and cleaning injection. We elected to attend you with her current treatment regimen: TENS unit, ibuprofen/Tylenol and heat and ice as she is currently improving. Patient has follow-up with PCP next week. It does not continue to improve would suggest injection.

## 2015-09-25 NOTE — Progress Notes (Signed)
Subjective:  Patient ID: Megan Bradley, female    DOB: 1940/08/28  Age: 75 y.o. MRN: [id number]  CC: Right shoulder pain  HPI:  75 year old female presents with complaints of right shoulder pain.  Right shoulder pain  Patient states that she was reaching for a seatbelt and subsequently developed right shoulder pain.  Occurred on Friday.  Pain is located in the lateral shoulder.  She has been using TENS unit and ibuprofen and Tylenol with improvement. She's also had improvement with hot shower.  She states that it is approximately 75% better but she still having some pain.  Exacerbated by movement/activity.  No reports of fall or other trauma.  No other complaints today.  Social Hx   Social History   Social History  . Marital Status: Married    Spouse Name: N/A  . Number of Children: N/A  . Years of Education: N/A   Social History Main Topics  . Smoking status: Never Smoker   . Smokeless tobacco: Never Used  . Alcohol Use: Yes     Comment: wine once a week  . Drug Use: No  . Sexual Activity: No   Other Topics Concern  . None   Social History Narrative   Lives in [place].  Active but doesn't exercise. Retired from [employer]  Constitutional: Negative.   Musculoskeletal:       Right shoulder pain.   Objective:  BP 122/82 mmHg  Pulse 67  Temp(Src) 98.1 F (36.7 C) (Oral)  Wt 156 lb 4 oz (70.875 kg)  SpO2 94%  BP/Weight 09/25/2015 09/14/2015 [id number]  Systolic BP 123XX123 [id number]  Diastolic BP 82 72 82  Wt. (Lbs) 156.25 156.75 156.4  BMI 25.6 25.68 25.62   Physical Exam  Constitutional: She is oriented to person, place, and time. She appears well-developed. No distress.  Pulmonary/Chest: Effort normal.  Musculoskeletal:  Shoulder: Right Inspection reveals no abnormalities, atrophy or asymmetry. Palpation is normal with no tenderness over AC joint or bicipital groove. ROM full but patient had pain with ROM. Rotator cuff strength - 4/5  supraspinatus, infraspinatus/teres minor. 5/5 subscapularis.    Neurological: She is alert and oriented to person, place, and time.  Psychiatric: She has a normal mood and affect.  Vitals reviewed.  Lab Results  Component Value Date   WBC 6.6 03/02/2015   HGB 13.3 03/02/2015   HCT 40.0 03/02/2015   PLT 189 03/02/2015   GLUCOSE 101* 03/02/2015   CHOL 170 01/01/2015   TRIG 49.0 01/01/2015   HDL 68.90 01/01/2015   LDLCALC 91 01/01/2015   ALT 15 03/02/2015   AST 18 03/02/2015   NA 141 03/02/2015   K 4.3 03/02/2015   CL 106 03/02/2015   CREATININE 1.00* 03/02/2015   BUN 19 03/02/2015   CO2 27 03/02/2015   TSH 1.151 03/02/2015   INR 0.9 12/05/2011   HGBA1C 6.8* 06/07/2014   MICROALBUR 0.2 10/17/2013   Assessment & Plan:   Problem List Items Addressed This Visit    Shoulder pain, left    New problem. Suspect subacromial bursitis versus rotator cuff injury. Patient currently improving. We discussed treatment options today and cleaning injection. We elected to attend you with her current treatment regimen: TENS unit, ibuprofen/Tylenol and heat and ice as she is currently improving. Patient has follow-up with PCP next week. It does not continue to improve would suggest injection.      Right shoulder pain - Primary     Follow-up:  PRN; already has follow up with PCP next week.  [place]

## 2015-09-25 NOTE — Patient Instructions (Signed)
Continue your current course of treatment: Tylenol, Ibuprofen, Tens unit, Heat (and/or ice).  Follow up closely with Dr. Derrel Nip  Consider injection if you fail to improve.  Take care  Dr. Lacinda Axon

## 2015-09-27 ENCOUNTER — Ambulatory Visit (INDEPENDENT_AMBULATORY_CARE_PROVIDER_SITE_OTHER): Payer: Medicare Other | Admitting: Internal Medicine

## 2015-09-27 ENCOUNTER — Encounter: Payer: Self-pay | Admitting: Internal Medicine

## 2015-09-27 VITALS — BP 126/74 | HR 65 | Temp 97.9°F | Resp 12 | Ht 66.0 in | Wt 157.0 lb

## 2015-09-27 DIAGNOSIS — M7551 Bursitis of right shoulder: Secondary | ICD-10-CM

## 2015-09-27 DIAGNOSIS — M7550 Bursitis of unspecified shoulder: Secondary | ICD-10-CM | POA: Insufficient documentation

## 2015-09-27 NOTE — Progress Notes (Signed)
Pre-visit discussion using our clinic review tool. No additional management support is needed unless otherwise documented below in the visit note.  

## 2015-09-27 NOTE — Progress Notes (Signed)
Subjective:  Patient ID: Megan Bradley, female    DOB: Jan 10, 1941  Age: 75 y.o. MRN: [id number]  CC: The encounter diagnosis was Subacromial bursitis, right.  HPI Megan Bradley presents for right shoulder pain , which began several weeks ago after reaching behind her for her purse.  Told it was a strain by Dr Lacinda Axon last week. Has been taking NSaIDS for a week with some improvement but pain is still limiting her ROM with regard to internal rotation    Outpatient Prescriptions Prior to Visit  Medication Sig Dispense Refill  . ALPRAZolam (XANAX) 0.5 MG tablet Take 1 tablet (0.5 mg total) by mouth at bedtime as needed for anxiety. 90 tablet 1  . Cholecalciferol (VITAMIN D) 2000 UNITS CAPS Take 1 capsule by mouth daily.      Marland Kitchen ezetimibe (ZETIA) 10 MG tablet Take 1 tablet (10 mg total) by mouth daily. 90 tablet 1  . glucose blood test strip For One touch Verio Flex .  Use to check 3 times daily   e11.9 100 each 0  . levothyroxine (SYNTHROID, LEVOTHROID) 112 MCG tablet Take 1 tablet (112 mcg total) by mouth daily before breakfast. 90 tablet 2  . meclizine (ANTIVERT) 25 MG tablet TAKE ONE TABLET BY MOUTH THREE TIMES DAILY AS NEEDED 30 tablet 5  . nitroGLYCERIN (NITROSTAT) 0.4 MG SL tablet Place 1 tablet (0.4 mg total) under the tongue every 5 (five) minutes as needed for chest pain. 50 tablet 3  . omeprazole (PRILOSEC) 40 MG capsule Take 1 capsule by mouth  daily 90 capsule 2  . sertraline (ZOLOFT) 100 MG tablet Take 1 tablet by mouth  daily 90 tablet 2   Facility-Administered Medications Prior to Visit  Medication Dose Route Frequency Provider Last Rate Last Dose  . lidocaine (XYLOCAINE) 1 % (with pres) injection 4 mL  4 mL Other Once Crecencio Mc, MD      . triamcinolone acetonide (KENALOG-40) injection 20 mg  20 mg Intra-articular Once Crecencio Mc, MD        Review of Systems;  Patient denies headache, fevers, malaise, unintentional weight loss, skin rash, eye pain, sinus congestion and  sinus pain, sore throat, dysphagia,  hemoptysis , cough, dyspnea, wheezing, chest pain, palpitations, orthopnea, edema, abdominal pain, nausea, melena, diarrhea, constipation, flank pain, dysuria, hematuria, urinary  Frequency, nocturia, numbness, tingling, seizures,  Focal weakness, Loss of consciousness,  Tremor, insomnia, depression, anxiety, and suicidal ideation.      Objective:  BP 126/74 mmHg  Pulse 65  Temp(Src) 97.9 F (36.6 C) (Oral)  Resp 12  Ht 5\' 6"  (1.676 m)  Wt 157 lb (71.215 kg)  BMI 25.35 kg/m2  SpO2 98%  BP Readings from Last 3 Encounters:  09/27/15 126/74  09/25/15 122/82  09/14/15 126/72    Wt Readings from Last 3 Encounters:  09/27/15 157 lb (71.215 kg)  09/25/15 156 lb 4 oz (70.875 kg)  09/14/15 156 lb 12 oz (71.101 kg)    General appearance: alert, cooperative and appears stated age Neck: no adenopathy, no carotid bruit, supple, symmetrical, trachea midline and thyroid not enlarged, symmetric, no tenderness/mass/nodules Back: symmetric, no curvature. ROM normal. No CVA tenderness. Lungs: clear to auscultation bilaterally Heart: regular rate and rhythm, S1, S2 normal, no murmur, click, rub or gallop MSK: left shoulder  With limited ROM DUE TO PAIN IN THE PROXIMAL DELTOID REGION.  Abdomen: soft, non-tender; bowel sounds normal; no masses,  no organomegaly Pulses: 2+ and symmetric Skin: Skin color,  texture, turgor normal. No rashes or lesions Lymph nodes: Cervical, supraclavicular, and axillary nodes normal.  Lab Results  Component Value Date   HGBA1C 6.8* 06/07/2014   HGBA1C 6.5 10/17/2013   HGBA1C 6.5 01/03/2013    Lab Results  Component Value Date   CREATININE 1.00* 03/02/2015   CREATININE 0.9 06/07/2014   CREATININE 1.03 10/22/2013    Lab Results  Component Value Date   WBC 6.6 03/02/2015   HGB 13.3 03/02/2015   HCT 40.0 03/02/2015   PLT 189 03/02/2015   GLUCOSE 101* 03/02/2015   CHOL 170 01/01/2015   TRIG 49.0 01/01/2015   HDL  68.90 01/01/2015   LDLCALC 91 01/01/2015   ALT 15 03/02/2015   AST 18 03/02/2015   NA 141 03/02/2015   K 4.3 03/02/2015   CL 106 03/02/2015   CREATININE 1.00* 03/02/2015   BUN 19 03/02/2015   CO2 27 03/02/2015   TSH 1.151 03/02/2015   INR 0.9 12/05/2011   HGBA1C 6.8* 06/07/2014   MICROALBUR 0.2 10/17/2013    Dg Shoulder Left  04/10/2015  CLINICAL DATA:  Arm pain. EXAM: LEFT SHOULDER - 2+ VIEW COMPARISON:  CT chest 08/24/2011. FINDINGS: Acromioclavicular glenohumeral degenerative change. No acute bony abnormality identified. Left breast implant. IMPRESSION: Acromioclavicular and glenohumeral degenerative change. No acute abnormality. Electronically Signed   By: Marcello Moores  Register   On: 04/10/2015 14:06    Assessment & Plan:   Problem List Items Addressed This Visit    Subacromial bursitis - Primary    Right shoulder.  Patient requesting joint injection for relief of pain .  INformed consent obtained.  Kenolog/Xylocaine injected into subacromial bursa without immediate complications         I am having Ms. Vickroy maintain her Vitamin D, meclizine, ezetimibe, sertraline, omeprazole, glucose blood, nitroGLYCERIN, ALPRAZolam, and levothyroxine. We will continue to administer triamcinolone acetonide and lidocaine.  No orders of the defined types were placed in this encounter.    There are no discontinued medications.  Follow-up: No Follow-up on file.   Crecencio Mc, MD

## 2015-09-29 NOTE — Assessment & Plan Note (Signed)
Right shoulder.  Patient requesting joint injection for relief of pain .  INformed consent obtained.  Kenolog/Xylocaine injected into subacromial bursa without immediate complications

## 2015-10-08 NOTE — Addendum Note (Signed)
Addended by: Crecencio Mc on: 10/08/2015 12:14 PM   Modules accepted: Miquel Dunn

## 2015-10-11 ENCOUNTER — Encounter: Payer: Self-pay | Admitting: Internal Medicine

## 2015-10-26 NOTE — Addendum Note (Signed)
Addended by: Crecencio Mc on: 10/26/2015 04:56 PM   Modules accepted: Miquel Dunn

## 2015-11-09 ENCOUNTER — Other Ambulatory Visit: Payer: Self-pay | Admitting: Internal Medicine

## 2015-11-09 DIAGNOSIS — Z1231 Encounter for screening mammogram for malignant neoplasm of breast: Secondary | ICD-10-CM

## 2015-11-19 DIAGNOSIS — X32XXXA Exposure to sunlight, initial encounter: Secondary | ICD-10-CM | POA: Diagnosis not present

## 2015-11-19 DIAGNOSIS — L570 Actinic keratosis: Secondary | ICD-10-CM | POA: Diagnosis not present

## 2015-11-19 DIAGNOSIS — Z85828 Personal history of other malignant neoplasm of skin: Secondary | ICD-10-CM | POA: Diagnosis not present

## 2015-11-23 ENCOUNTER — Other Ambulatory Visit: Payer: Self-pay | Admitting: Internal Medicine

## 2015-11-23 NOTE — Telephone Encounter (Signed)
error 

## 2015-12-17 ENCOUNTER — Other Ambulatory Visit: Payer: Self-pay | Admitting: Internal Medicine

## 2015-12-26 ENCOUNTER — Ambulatory Visit
Admission: RE | Admit: 2015-12-26 | Discharge: 2015-12-26 | Disposition: A | Discharge status: Home / Self Care | Payer: Medicare Other | Source: Ambulatory Visit | Attending: Internal Medicine | Admitting: Internal Medicine

## 2015-12-26 DIAGNOSIS — Z1231 Encounter for screening mammogram for malignant neoplasm of breast: Secondary | ICD-10-CM | POA: Diagnosis not present

## 2015-12-26 DIAGNOSIS — Z0142 Encounter for cervical smear to confirm findings of recent normal smear following initial abnormal smear: Secondary | ICD-10-CM | POA: Diagnosis not present

## 2015-12-26 DIAGNOSIS — Z01419 Encounter for gynecological examination (general) (routine) without abnormal findings: Secondary | ICD-10-CM | POA: Diagnosis not present

## 2015-12-26 DIAGNOSIS — Z6825 Body mass index (BMI) 25.0-25.9, adult: Secondary | ICD-10-CM | POA: Diagnosis not present

## 2016-01-02 ENCOUNTER — Encounter: Payer: Self-pay | Admitting: Internal Medicine

## 2016-01-02 ENCOUNTER — Ambulatory Visit (INDEPENDENT_AMBULATORY_CARE_PROVIDER_SITE_OTHER): Payer: Medicare Other | Admitting: Internal Medicine

## 2016-01-02 VITALS — BP 102/62 | HR 76 | Temp 98.2°F | Resp 16 | Wt 157.0 lb

## 2016-01-02 DIAGNOSIS — E034 Atrophy of thyroid (acquired): Secondary | ICD-10-CM

## 2016-01-02 DIAGNOSIS — E038 Other specified hypothyroidism: Secondary | ICD-10-CM | POA: Diagnosis not present

## 2016-01-02 DIAGNOSIS — E119 Type 2 diabetes mellitus without complications: Secondary | ICD-10-CM

## 2016-01-02 DIAGNOSIS — F419 Anxiety disorder, unspecified: Secondary | ICD-10-CM

## 2016-01-02 DIAGNOSIS — E785 Hyperlipidemia, unspecified: Secondary | ICD-10-CM

## 2016-01-02 DIAGNOSIS — N951 Menopausal and female climacteric states: Secondary | ICD-10-CM

## 2016-01-02 DIAGNOSIS — E032 Hypothyroidism due to medicaments and other exogenous substances: Secondary | ICD-10-CM

## 2016-01-02 DIAGNOSIS — R7989 Other specified abnormal findings of blood chemistry: Secondary | ICD-10-CM | POA: Diagnosis not present

## 2016-01-02 DIAGNOSIS — F418 Other specified anxiety disorders: Secondary | ICD-10-CM

## 2016-01-02 LAB — MICROALBUMIN / CREATININE URINE RATIO
CREATININE, U: 87 mg/dL
Microalb Creat Ratio: 0.8 mg/g (ref 0.0–30.0)

## 2016-01-02 LAB — COMPREHENSIVE METABOLIC PANEL
ALK PHOS: 47 U/L (ref 39–117)
ALT: 15 U/L (ref 0–35)
AST: 17 U/L (ref 0–37)
Albumin: 4.1 g/dL (ref 3.5–5.2)
BILIRUBIN TOTAL: 0.6 mg/dL (ref 0.2–1.2)
BUN: 21 mg/dL (ref 6–23)
CALCIUM: 9.6 mg/dL (ref 8.4–10.5)
CO2: 28 mEq/L (ref 19–32)
Chloride: 106 mEq/L (ref 96–112)
Creatinine, Ser: 1.03 mg/dL (ref 0.40–1.20)
GFR: 55.76 mL/min — AB (ref >60.00)
Glucose, Bld: 97 mg/dL (ref 70–99)
POTASSIUM: 4.1 meq/L (ref 3.5–5.1)
Sodium: 142 mEq/L (ref 135–145)
TOTAL PROTEIN: 6.8 g/dL (ref 6.0–8.3)

## 2016-01-02 LAB — CBC WITH DIFFERENTIAL/PLATELET
BASOS ABS: 0 10*3/uL (ref 0.0–0.1)
Basophils Relative: 0.7 % (ref 0.0–3.0)
Eosinophils Absolute: 0.1 10*3/uL (ref 0.0–0.7)
Eosinophils Relative: 1.7 % (ref 0.0–5.0)
HCT: 38.1 % (ref 36.0–46.0)
Hemoglobin: 12.7 g/dL (ref 12.0–15.0)
LYMPHS ABS: 1.8 10*3/uL (ref 0.7–4.0)
Lymphocytes Relative: 25.4 % (ref 12.0–46.0)
MCHC: 33.3 g/dL (ref 30.0–36.0)
MCV: 90.2 fl (ref 78.0–100.0)
MONO ABS: 0.4 10*3/uL (ref 0.1–1.0)
MONOS PCT: 6.1 % (ref 3.0–12.0)
NEUTROS PCT: 66.1 % (ref 43.0–77.0)
Neutro Abs: 4.6 10*3/uL (ref 1.4–7.7)
Platelets: 173 10*3/uL (ref 150.0–400.0)
RBC: 4.22 Mil/uL (ref 3.87–5.11)
RDW: 16.3 % — ABNORMAL HIGH (ref 11.5–15.5)
WBC: 6.9 10*3/uL (ref 4.0–10.5)

## 2016-01-02 LAB — LDL CHOLESTEROL, DIRECT: LDL DIRECT: 93 mg/dL

## 2016-01-02 LAB — HEMOGLOBIN A1C: HEMOGLOBIN A1C: 6.5 % (ref 4.6–6.5)

## 2016-01-02 LAB — LIPID PANEL
CHOLESTEROL: 176 mg/dL (ref 0–200)
HDL: 72.3 mg/dL (ref >39.00)
LDL Cholesterol: 96 mg/dL (ref 0–99)
NonHDL: 104.03
TRIGLYCERIDES: 42 mg/dL (ref 0.0–149.0)
Total CHOL/HDL Ratio: 2
VLDL: 8.4 mg/dL (ref 0.0–40.0)

## 2016-01-02 LAB — TSH: TSH: 0.07 u[IU]/mL — AB (ref 0.35–4.50)

## 2016-01-02 MED ORDER — SERTRALINE HCL 100 MG PO TABS: 150.0000 mg | ORAL_TABLET | Freq: Every day | ORAL | 2 refills | Status: DC

## 2016-01-02 NOTE — Patient Instructions (Addendum)
We have increased the zoloft to 150 mg daily   Fasting labs today  Hot flashes are probably menopause but check BS next time   Thanks for the [facility] referral!    Return for steroid injection left shoulder

## 2016-01-02 NOTE — Progress Notes (Signed)
Subjective:  Patient ID: Megan Bradley, female    DOB: 1941/03/17  Age: 75 y.o. MRN: [id number]  CC: The primary encounter diagnosis was Diabetes mellitus without complication ([place]). Diagnoses of Hypothyroidism due to acquired atrophy of thyroid, Hyperlipidemia, Abnormal CBC, Anxiety with obsessional features, Iatrogenic hypothyroidism, and Hot flushes, perimenopausal were also pertinent to this visit.  HPI Megan Bradley presents for follow up on multiple issues  She has been having  anxiety attacks heralded by nausea, managed with xanax and meclizine , not occurring more than once per week. No obvious triggers, no recent accidents or falls.   Also having new onset hot flashes , not accompanied by confusion or  tachycardia .  Has not ruled out hypoglycemia.   Left shoulder joint has been painful to move.  Previous steroid injections ave provided months of relief.  Nov 2016 . Bone spurs noted on plain films. Done Oct 2016.       Outpatient Medications Prior to Visit  Medication Sig Dispense Refill  . ALPRAZolam (XANAX) 0.5 MG tablet Take 1 tablet (0.5 mg total) by mouth at bedtime as needed for anxiety. 90 tablet 1  . Cholecalciferol (VITAMIN D) 2000 UNITS CAPS Take 1 capsule by mouth daily.      Marland Kitchen ezetimibe (ZETIA) 10 MG tablet Take 1 tablet by mouth  daily 90 tablet 2  . glucose blood test strip For One touch Verio Flex .  Use to check 3 times daily   e11.9 100 each 0  . meclizine (ANTIVERT) 25 MG tablet TAKE ONE TABLET BY MOUTH THREE TIMES DAILY AS NEEDED FOR VERTIGO 30 tablet 1  . nitroGLYCERIN (NITROSTAT) 0.4 MG SL tablet Place 1 tablet (0.4 mg total) under the tongue every 5 (five) minutes as needed for chest pain. 50 tablet 3  . omeprazole (PRILOSEC) 40 MG capsule Take 1 capsule by mouth  daily 90 capsule 2  . levothyroxine (SYNTHROID, LEVOTHROID) 112 MCG tablet Take 1 tablet by mouth  daily before breakfast 90 tablet 2  . sertraline (ZOLOFT) 100 MG tablet Take 1 tablet by mouth   daily 90 tablet 2   Facility-Administered Medications Prior to Visit  Medication Dose Route Frequency Provider Last Rate Last Dose  . lidocaine (XYLOCAINE) 1 % (with pres) injection 4 mL  4 mL Other Once Crecencio Mc, MD      . triamcinolone acetonide (KENALOG-40) injection 20 mg  20 mg Intra-articular Once Crecencio Mc, MD        Review of Systems;  Patient denies headache, fevers, malaise, unintentional weight loss, skin rash, eye pain, sinus congestion and sinus pain, sore throat, dysphagia,  hemoptysis , cough, dyspnea, wheezing, chest pain, palpitations, orthopnea, edema, abdominal pain, nausea, melena, diarrhea, constipation, flank pain, dysuria, hematuria, urinary  Frequency, nocturia, numbness, tingling, seizures,  Focal weakness, Loss of consciousness,  Tremor, insomnia, depression, anxiety, and suicidal ideation.      Objective:  BP 102/62 (BP Location: Left Arm, Patient Position: Sitting, Cuff Size: Normal)   Pulse 76   Temp 98.2 F (36.8 C) (Oral)   Resp 16   Wt 157 lb (71.2 kg)   BMI 25.34 kg/m   BP Readings from Last 3 Encounters:  01/02/16 102/62  09/27/15 126/74  09/25/15 122/82    Wt Readings from Last 3 Encounters:  01/02/16 157 lb (71.2 kg)  09/27/15 157 lb (71.2 kg)  09/25/15 156 lb 4 oz (70.9 kg)    General appearance: alert, cooperative and appears stated age  Ears: normal TM's and external ear canals both ears Throat: lips, mucosa, and tongue normal; teeth and gums normal Neck: no adenopathy, no carotid bruit, supple, symmetrical, trachea midline and thyroid not enlarged, symmetric, no tenderness/mass/nodules Back: symmetric, no curvature. ROM normal. No CVA tenderness. Lungs: clear to auscultation bilaterally Heart: regular rate and rhythm, S1, S2 normal, no murmur, click, rub or gallop Abdomen: soft, non-tender; bowel sounds normal; no masses,  no organomegaly Pulses: 2+ and symmetric Skin: Skin color, texture, turgor normal. No rashes or  lesions Lymph nodes: Cervical, supraclavicular, and axillary nodes normal.  Lab Results  Component Value Date   HGBA1C 6.5 01/02/2016   HGBA1C 6.8 (H) 06/07/2014   HGBA1C 6.5 10/17/2013    Lab Results  Component Value Date   CREATININE 1.03 01/02/2016   CREATININE 1.00 (H) 03/02/2015   CREATININE 0.9 06/07/2014    Lab Results  Component Value Date   WBC 6.9 01/02/2016   HGB 12.7 01/02/2016   HCT 38.1 01/02/2016   PLT 173.0 01/02/2016   GLUCOSE 97 01/02/2016   CHOL 176 01/02/2016   TRIG 42.0 01/02/2016   HDL 72.30 01/02/2016   LDLDIRECT 93.0 01/02/2016   LDLCALC 96 01/02/2016   ALT 15 01/02/2016   AST 17 01/02/2016   NA 142 01/02/2016   K 4.1 01/02/2016   CL 106 01/02/2016   CREATININE 1.03 01/02/2016   BUN 21 01/02/2016   CO2 28 01/02/2016   TSH 0.07 (L) 01/02/2016   INR 0.9 12/05/2011   HGBA1C 6.5 01/02/2016   MICROALBUR <0.7 01/02/2016    Mm Screening Breast W/implant Tomo Bilateral  Result Date: 12/27/2015 CLINICAL DATA:  Screening. EXAM: 2D DIGITAL SCREENING BILATERAL MAMMOGRAM WITH IMPLANTS, CAD AND ADJUNCT TOMO The patient has bilateral prepectoral silicone implants. Standard and implant displaced views were performed. COMPARISON:  Previous exam(s). ACR Breast Density Category b: There are scattered areas of fibroglandular density. FINDINGS: There are no findings suspicious for malignancy. Images were processed with CAD. IMPRESSION: No mammographic evidence of malignancy. A result letter of this screening mammogram will be mailed directly to the patient. RECOMMENDATION: Screening mammogram in one year. (Code:SM-B-01Y) BI-RADS CATEGORY  1:  Negative. Electronically Signed   By: Everlean Alstrom M.D.   On: 12/27/2015 09:50    Assessment & Plan:   Problem List Items Addressed This Visit    Iatrogenic hypothyroidism    S/p thyroidectomy for MNG Dec 2016.  TSH is suppressed on current dose of levothyroxine.  Will recommend ower dose       Relevant Medications     levothyroxine (SYNTHROID, LEVOTHROID) 100 MCG tablet   Diabetes mellitus without complication ([place]) - Primary    Currently well-controlled on low GI diet.  hemoglobin A1c has improved and now 6.5  . Patient is reminded to schedule an annual eye exam and foot exam is normal today. Patient has no microalbuminuria. Patient is tolerating statin therapy for CAD risk reduction and on ACE/ARB for renal protection and hypertension   Lab Results  Component Value Date   HGBA1C 6.5 01/02/2016   Lab Results  Component Value Date   MICROALBUR <0.7 01/02/2016         Relevant Orders   Comprehensive metabolic panel (Completed)   Hemoglobin A1c (Completed)   Lipid panel (Completed)   Microalbumin / creatinine urine ratio (Completed)   CBC with Differential/Platelet (Completed)   Anxiety with obsessional features    increasing dose of zoloft to 150 mg       Relevant Medications  sertraline (ZOLOFT) 100 MG tablet   Hyperlipidemia    Well controlled on Zetia.   Liver enzymes are normal , no changes today.  Lab Results  Component Value Date   CHOL 176 01/02/2016   HDL 72.30 01/02/2016   LDLCALC 96 01/02/2016   LDLDIRECT 93.0 01/02/2016   TRIG 42.0 01/02/2016   CHOLHDL 2 01/02/2016    Lab Results  Component Value Date   ALT 15 01/02/2016   AST 17 01/02/2016   ALKPHOS 47 01/02/2016   BILITOT 0.6 01/02/2016           Relevant Orders   LDL cholesterol, direct (Completed)   Hot flushes, perimenopausal    Suggested she check her blood sugar during next episode       Other Visit Diagnoses    Hypothyroidism due to acquired atrophy of thyroid       Relevant Medications   levothyroxine (SYNTHROID, LEVOTHROID) 100 MCG tablet   Other Relevant Orders   TSH (Completed)   Abnormal CBC         A total of 25 minutes of face to face time was spent with patient more than half of which was spent in counselling about the above mentioned conditions  and coordination of care  I have  changed Ms. Vanzee's sertraline and levothyroxine. I am also having her maintain her Vitamin D, omeprazole, glucose blood, nitroGLYCERIN, ALPRAZolam, meclizine, ezetimibe, and TART CHERRY ADVANCED. We will continue to administer triamcinolone acetonide and lidocaine.  Meds ordered this encounter  Medications  . Misc Natural Products (TART CHERRY ADVANCED) CAPS    Sig: Take by mouth.  . sertraline (ZOLOFT) 100 MG tablet    Sig: Take 1.5 tablets (150 mg total) by mouth daily.    Dispense:  45 tablet    Refill:  2  . levothyroxine (SYNTHROID, LEVOTHROID) 100 MCG tablet    Sig: Take 1 tablet (100 mcg total) by mouth daily before breakfast.    Dispense:  90 tablet    Refill:  1    Medications Discontinued During This Encounter  Medication Reason  . sertraline (ZOLOFT) 100 MG tablet Reorder  . levothyroxine (SYNTHROID, LEVOTHROID) 112 MCG tablet Reorder    Follow-up: Return for 1 week for shoulder injection .   Crecencio Mc, MD

## 2016-01-05 ENCOUNTER — Encounter: Payer: Self-pay | Admitting: Internal Medicine

## 2016-01-05 ENCOUNTER — Other Ambulatory Visit: Payer: Self-pay | Admitting: Internal Medicine

## 2016-01-05 DIAGNOSIS — N951 Menopausal and female climacteric states: Secondary | ICD-10-CM | POA: Insufficient documentation

## 2016-01-05 DIAGNOSIS — E032 Hypothyroidism due to medicaments and other exogenous substances: Secondary | ICD-10-CM

## 2016-01-05 MED ORDER — LEVOTHYROXINE SODIUM 100 MCG PO TABS: 100.0000 ug | ORAL_TABLET | Freq: Every day | ORAL | 1 refills | Status: DC

## 2016-01-05 NOTE — Assessment & Plan Note (Signed)
Currently well-controlled on low GI diet.  hemoglobin A1c has improved and now 6.5  . Patient is reminded to schedule an annual eye exam and foot exam is normal today. Patient has no microalbuminuria. Patient is tolerating statin therapy for CAD risk reduction and on ACE/ARB for renal protection and hypertension   Lab Results  Component Value Date   HGBA1C 6.5 01/02/2016   Lab Results  Component Value Date   MICROALBUR <0.7 01/02/2016

## 2016-01-05 NOTE — Assessment & Plan Note (Signed)
Well controlled on Zetia.   Liver enzymes are normal , no changes today.  Lab Results  Component Value Date   CHOL 176 01/02/2016   HDL 72.30 01/02/2016   LDLCALC 96 01/02/2016   LDLDIRECT 93.0 01/02/2016   TRIG 42.0 01/02/2016   CHOLHDL 2 01/02/2016    Lab Results  Component Value Date   ALT 15 01/02/2016   AST 17 01/02/2016   ALKPHOS 47 01/02/2016   BILITOT 0.6 01/02/2016

## 2016-01-05 NOTE — Assessment & Plan Note (Signed)
Suggested she check her blood sugar during next episode

## 2016-01-05 NOTE — Assessment & Plan Note (Signed)
S/p thyroidectomy for MNG Dec 2016.  TSH is suppressed on current dose of levothyroxine.  Will recommend ower dose

## 2016-01-05 NOTE — Assessment & Plan Note (Addendum)
increasing dose of zoloft to 150 mg

## 2016-01-08 ENCOUNTER — Ambulatory Visit (INDEPENDENT_AMBULATORY_CARE_PROVIDER_SITE_OTHER): Payer: Medicare Other | Admitting: Internal Medicine

## 2016-01-08 ENCOUNTER — Encounter: Payer: Self-pay | Admitting: Internal Medicine

## 2016-01-08 VITALS — BP 106/60 | HR 69 | Temp 97.8°F | Ht 66.0 in | Wt 159.0 lb

## 2016-01-08 DIAGNOSIS — M25512 Pain in left shoulder: Secondary | ICD-10-CM | POA: Diagnosis not present

## 2016-01-08 DIAGNOSIS — E032 Hypothyroidism due to medicaments and other exogenous substances: Secondary | ICD-10-CM | POA: Diagnosis not present

## 2016-01-08 NOTE — Progress Notes (Signed)
Subjective:  Patient ID: Megan Bradley, female    DOB: [birth year]  Age: 75 y.o. MRN: [id number]  CC: The primary encounter diagnosis was Iatrogenic hypothyroidism. A diagnosis of Shoulder pain, left was also pertinent to this visit.  HPI Megan Bradley presents for treatment of shoulder pain .  She has DJD of the shoulder and recurrent episodes of pain. Marland Kitchen  Her last injection was in November and her shoulder remained virtually pain free until a month ago. Her pain is aggravated by lateral raises of the arm and with internal rotation.   Lab Results  Component Value Date   TSH 0.07 (L) 01/02/2016    Needs repeat tsh and free t4 in 6 weeks   Outpatient Medications Prior to Visit  Medication Sig Dispense Refill  . ALPRAZolam (XANAX) 0.5 MG tablet Take 1 tablet (0.5 mg total) by mouth at bedtime as needed for anxiety. 90 tablet 1  . Cholecalciferol (VITAMIN D) 2000 UNITS CAPS Take 1 capsule by mouth daily.      Marland Kitchen ezetimibe (ZETIA) 10 MG tablet Take 1 tablet by mouth  daily 90 tablet 2  . glucose blood test strip For One touch Verio Flex .  Use to check 3 times daily   e11.9 100 each 0  . levothyroxine (SYNTHROID, LEVOTHROID) 100 MCG tablet Take 1 tablet (100 mcg total) by mouth daily before breakfast. 90 tablet 1  . meclizine (ANTIVERT) 25 MG tablet TAKE ONE TABLET BY MOUTH THREE TIMES DAILY AS NEEDED FOR VERTIGO 30 tablet 1  . Misc Natural Products (TART CHERRY ADVANCED) CAPS Take by mouth.    . nitroGLYCERIN (NITROSTAT) 0.4 MG SL tablet Place 1 tablet (0.4 mg total) under the tongue every 5 (five) minutes as needed for chest pain. 50 tablet 3  . omeprazole (PRILOSEC) 40 MG capsule Take 1 capsule by mouth  daily 90 capsule 2  . sertraline (ZOLOFT) 100 MG tablet Take 1.5 tablets (150 mg total) by mouth daily. 45 tablet 2   Facility-Administered Medications Prior to Visit  Medication Dose Route Frequency Provider Last Rate Last Dose  . triamcinolone acetonide (KENALOG-40) injection 20 mg  20  mg Intra-articular Once Crecencio Mc, MD      . lidocaine (XYLOCAINE) 1 % (with pres) injection 4 mL  4 mL Other Once Crecencio Mc, MD        Review of Systems;  Patient denies headache, fevers, malaise, unintentional weight loss, skin rash, eye pain, sinus congestion and sinus pain, sore throat, dysphagia,  hemoptysis , cough, dyspnea, wheezing, chest pain, palpitations, orthopnea, edema, abdominal pain, nausea, melena, diarrhea, constipation, flank pain, dysuria, hematuria, urinary  Frequency, nocturia, numbness, tingling, seizures,  Focal weakness, Loss of consciousness,  Tremor, insomnia, depression, anxiety, and suicidal ideation.      Objective:  BP 106/60   Pulse 69   Temp 97.8 F (36.6 C) (Oral)   Ht 5\' 6"  (1.676 m)   Wt 159 lb (72.1 kg)   SpO2 97%   BMI 25.66 kg/m   BP Readings from Last 3 Encounters:  01/08/16 106/60  01/02/16 102/62  09/27/15 126/74    Wt Readings from Last 3 Encounters:  01/08/16 159 lb (72.1 kg)  01/02/16 157 lb (71.2 kg)  09/27/15 157 lb (71.2 kg)    General appearance: alert, cooperative and appears stated age Back: symmetric, no curvature. ROM normal. No CVA tenderness. Lungs: clear to auscultation bilaterally Heart: regular rate and rhythm, S1, S2 normal, no murmur, click, rub  or gallop MSK: left shoulder with restricted ROM secondary to pain : internal rotation, abduction Skin: Skin color, texture, turgor normal. No rashes or lesions Lymph nodes: Cervical, supraclavicular, and axillary nodes normal.  Lab Results  Component Value Date   HGBA1C 6.5 01/02/2016   HGBA1C 6.8 (H) 06/07/2014   HGBA1C 6.5 10/17/2013    Lab Results  Component Value Date   CREATININE 1.03 01/02/2016   CREATININE 1.00 (H) 03/02/2015   CREATININE 0.9 06/07/2014    Lab Results  Component Value Date   WBC 6.9 01/02/2016   HGB 12.7 01/02/2016   HCT 38.1 01/02/2016   PLT 173.0 01/02/2016   GLUCOSE 97 01/02/2016   CHOL 176 01/02/2016   TRIG 42.0  01/02/2016   HDL 72.30 01/02/2016   LDLDIRECT 93.0 01/02/2016   LDLCALC 96 01/02/2016   ALT 15 01/02/2016   AST 17 01/02/2016   NA 142 01/02/2016   K 4.1 01/02/2016   CL 106 01/02/2016   CREATININE 1.03 01/02/2016   BUN 21 01/02/2016   CO2 28 01/02/2016   TSH 0.07 (L) 01/02/2016   INR 0.9 12/05/2011   HGBA1C 6.5 01/02/2016   MICROALBUR <0.7 01/02/2016    Mm Screening Breast W/implant Tomo Bilateral  Result Date: 12/27/2015 CLINICAL DATA:  Screening. EXAM: 2D DIGITAL SCREENING BILATERAL MAMMOGRAM WITH IMPLANTS, CAD AND ADJUNCT TOMO The patient has bilateral prepectoral silicone implants. Standard and implant displaced views were performed. COMPARISON:  Previous exam(s). ACR Breast Density Category b: There are scattered areas of fibroglandular density. FINDINGS: There are no findings suspicious for malignancy. Images were processed with CAD. IMPRESSION: No mammographic evidence of malignancy. A result letter of this screening mammogram will be mailed directly to the patient. RECOMMENDATION: Screening mammogram in one year. (Code:SM-B-01Y) BI-RADS CATEGORY  1:  Negative. Electronically Signed   By: Everlean Alstrom M.D.   On: 12/27/2015 09:50    Assessment & Plan:   Problem List Items Addressed This Visit    Shoulder pain, left    Suspect subacromial bursitis versus rotator cuff injury. Patient currently not improving.t injection.and requesting steroid injection.  Informed consent for joint injection obtained.  Area was cleaned with betadine.  Subacromial bursa was injected with Kenalog and Xylocaine .  Procedure was tolerated well and shoulder was feeling less painful by the time she left the office.  She was advised to rest the arm for 48 hours , apply ice packs every 6 hours for 15 minutes, advised to call if she develops signs of infection        Relevant Medications   triamcinolone acetonide (KENALOG-40) injection 20 mg   lidocaine (XYLOCAINE) 1 % (with pres) injection 4 mL    Iatrogenic hypothyroidism - Primary   Relevant Orders   T4 AND TSH    Other Visit Diagnoses   None.     I am having Ms. Somoza maintain her Vitamin D, omeprazole, glucose blood, nitroGLYCERIN, ALPRAZolam, meclizine, ezetimibe, TART CHERRY ADVANCED, sertraline, and levothyroxine. We will stop administering lidocaine. Additionally, we will continue to administer triamcinolone acetonide, triamcinolone acetonide, and lidocaine.  Meds ordered this encounter  Medications  . triamcinolone acetonide (KENALOG-40) injection 20 mg  . lidocaine (XYLOCAINE) 1 % (with pres) injection 4 mL    Medications Discontinued During This Encounter  Medication Reason  . lidocaine (XYLOCAINE) 1 % (with pres) injection 4 mL     Follow-up: No Follow-up on file.   Crecencio Mc, MD

## 2016-01-09 MED ORDER — LIDOCAINE HCL 1 % IJ SOLN: 4.0000 mL | Freq: Once | INTRAMUSCULAR | Status: DC

## 2016-01-09 MED ORDER — TRIAMCINOLONE ACETONIDE 40 MG/ML IJ SUSP: 20.0000 mg | Freq: Once | INTRAMUSCULAR | Status: DC

## 2016-01-09 NOTE — Assessment & Plan Note (Signed)
Suspect subacromial bursitis versus rotator cuff injury. Patient currently not improving.t injection.and requesting steroid injection.  Informed consent for joint injection obtained.  Area was cleaned with betadine.  Subacromial bursa was injected with Kenalog and Xylocaine .  Procedure was tolerated well and shoulder was feeling less painful by the time she left the office.  She was advised to rest the arm for 48 hours , apply ice packs every 6 hours for 15 minutes, advised to call if she develops signs of infection

## 2016-02-04 ENCOUNTER — Encounter: Payer: Self-pay | Admitting: Internal Medicine

## 2016-02-04 NOTE — Telephone Encounter (Signed)
Patient requesting Zoloft 150 mg instead of 100 mg please advise ok to fill.

## 2016-02-05 ENCOUNTER — Other Ambulatory Visit: Payer: Self-pay | Admitting: Internal Medicine

## 2016-02-05 MED ORDER — SERTRALINE HCL 100 MG PO TABS: 150.0000 mg | ORAL_TABLET | Freq: Every day | ORAL | 2 refills | Status: DC

## 2016-02-18 ENCOUNTER — Other Ambulatory Visit (INDEPENDENT_AMBULATORY_CARE_PROVIDER_SITE_OTHER): Payer: Medicare Other

## 2016-02-18 DIAGNOSIS — E032 Hypothyroidism due to medicaments and other exogenous substances: Secondary | ICD-10-CM | POA: Diagnosis not present

## 2016-02-18 DIAGNOSIS — Z23 Encounter for immunization: Secondary | ICD-10-CM

## 2016-02-18 LAB — TSH: TSH: 0.27 u[IU]/mL — ABNORMAL LOW (ref 0.35–4.50)

## 2016-02-19 LAB — T4 AND TSH
T4, Total: 7.8 ug/dL (ref 4.5–12.0)
TSH: 0.283 u[IU]/mL — AB (ref 0.450–4.500)

## 2016-02-21 ENCOUNTER — Encounter: Payer: Self-pay | Admitting: Internal Medicine

## 2016-02-21 ENCOUNTER — Other Ambulatory Visit: Payer: Self-pay | Admitting: Internal Medicine

## 2016-02-21 MED ORDER — LEVOTHYROXINE SODIUM 88 MCG PO TABS: 88.0000 ug | ORAL_TABLET | Freq: Every day | ORAL | 1 refills | Status: DC

## 2016-02-23 ENCOUNTER — Encounter: Payer: Self-pay | Admitting: Internal Medicine

## 2016-02-26 ENCOUNTER — Emergency Department (HOSPITAL_COMMUNITY): Payer: Medicare Other

## 2016-02-26 ENCOUNTER — Encounter (HOSPITAL_COMMUNITY): Payer: Self-pay

## 2016-02-26 ENCOUNTER — Emergency Department (HOSPITAL_COMMUNITY)
Admission: EM | Admit: 2016-02-26 | Discharge: 2016-02-26 | Disposition: A | Discharge status: Home / Self Care | Payer: Medicare Other | Attending: Emergency Medicine | Admitting: Emergency Medicine

## 2016-02-26 DIAGNOSIS — R0602 Shortness of breath: Secondary | ICD-10-CM | POA: Diagnosis not present

## 2016-02-26 DIAGNOSIS — R072 Precordial pain: Secondary | ICD-10-CM | POA: Diagnosis not present

## 2016-02-26 DIAGNOSIS — R079 Chest pain, unspecified: Secondary | ICD-10-CM

## 2016-02-26 DIAGNOSIS — E039 Hypothyroidism, unspecified: Secondary | ICD-10-CM | POA: Diagnosis not present

## 2016-02-26 DIAGNOSIS — Z79899 Other long term (current) drug therapy: Secondary | ICD-10-CM | POA: Diagnosis not present

## 2016-02-26 DIAGNOSIS — E119 Type 2 diabetes mellitus without complications: Secondary | ICD-10-CM | POA: Insufficient documentation

## 2016-02-26 LAB — CBC WITH DIFFERENTIAL/PLATELET
BASOS PCT: 0 %
Basophils Absolute: 0 10*3/uL (ref 0.0–0.1)
EOS ABS: 0.1 10*3/uL (ref 0.0–0.7)
Eosinophils Relative: 1 %
HCT: 38.3 % (ref 36.0–46.0)
HEMOGLOBIN: 12.5 g/dL (ref 12.0–15.0)
Lymphocytes Relative: 12 %
Lymphs Abs: 1 10*3/uL (ref 0.7–4.0)
MCH: 29.5 pg (ref 26.0–34.0)
MCHC: 32.6 g/dL (ref 30.0–36.0)
MCV: 90.3 fL (ref 78.0–100.0)
Monocytes Absolute: 0.5 10*3/uL (ref 0.1–1.0)
Monocytes Relative: 6 %
NEUTROS PCT: 81 %
Neutro Abs: 6.7 10*3/uL (ref 1.7–7.7)
Platelets: 158 10*3/uL (ref 150–400)
RBC: 4.24 MIL/uL (ref 3.87–5.11)
RDW: 15.3 % (ref 11.5–15.5)
WBC: 8.2 10*3/uL (ref 4.0–10.5)

## 2016-02-26 LAB — BASIC METABOLIC PANEL
Anion gap: 8 (ref 5–15)
BUN: 20 mg/dL (ref 6–20)
CALCIUM: 9.3 mg/dL (ref 8.9–10.3)
CO2: 25 mmol/L (ref 22–32)
CREATININE: 1.03 mg/dL — AB (ref 0.44–1.00)
Chloride: 107 mmol/L (ref 101–111)
GFR, EST NON AFRICAN AMERICAN: 53 mL/min — AB (ref >60)
Glucose, Bld: 133 mg/dL — ABNORMAL HIGH (ref 65–99)
Potassium: 3.8 mmol/L (ref 3.5–5.1)
SODIUM: 140 mmol/L (ref 135–145)

## 2016-02-26 LAB — I-STAT TROPONIN, ED
TROPONIN I, POC: 0 ng/mL (ref 0.00–0.08)
TROPONIN I, POC: 0 ng/mL (ref 0.00–0.08)

## 2016-02-26 LAB — D-DIMER, QUANTITATIVE (NOT AT ARMC): D DIMER QUANT: 0.91 ug{FEU}/mL — AB (ref 0.00–0.50)

## 2016-02-26 MED ORDER — IOPAMIDOL (ISOVUE-370) INJECTION 76%
INTRAVENOUS | Status: AC
  Administered 2016-02-26: 100 mL
  Filled 2016-02-26: qty 100

## 2016-02-26 NOTE — ED Notes (Signed)
Patient transported to X-ray 

## 2016-02-26 NOTE — ED Notes (Signed)
Pt transported to CT ?

## 2016-02-26 NOTE — ED Provider Notes (Signed)
Assume care from [facility]es at [phone], please see their documentation for complete history and physical.  I have reviewed documentation and agree with previous providers assessment.  Patient is a 75 y.o. with past medical history as below.   Plan is to finish delta troponin and CTA chest.   CTA chest showed no evidence of Pe. Final troponin negative. Pt given appropriate f/u and return precautions. Pt voiced understanding and is agreeable to discharge at this time.    Past Medical History:  Diagnosis Date  . Anxiety   . Anxiety disorder   . Cervical dysplasia    a. 2010 - high grade squamous intraepithelial lesion s/p excision.  . Chest pain    a. 2006 or 2007 Cath [facility]): reportedly nl cors;  b. 09/2011 ETT: nl.  . GERD (gastroesophageal reflux disease)   . Herpes zoster   . Hypothyroidism   . Orthostatic hypotension   . Pre-diabetes   . RBBB   . Vertigo        Chapman Moss, MD 02/26/16 Montrose Liu, MD 02/27/16 [phone]

## 2016-02-26 NOTE — ED Notes (Deleted)
MD at bedside. 

## 2016-02-26 NOTE — ED Triage Notes (Signed)
Pt brought in by EMS due to having chest pain. Pt does endorse SOB, n/v, and dizziness. Pt has hx of esophageal spasms. Pt received 324mg  of aspirin and nitro x1. Pt a&ox4.

## 2016-02-26 NOTE — ED Provider Notes (Signed)
[facility] DEPT Provider Note   CSN: [id number] Arrival date & time: 02/26/16  1216     History   Chief Complaint Chief Complaint  Patient presents with  . Chest Pain    HPI Megan Bradley is a 75 y.o. female.  The history is provided by the patient and medical records.    75 year old female with history of anxiety, GERD, hypothyroidism, prediabetes managed with diet, vertigo, esophageal spasms, presenting to the ED for chest pain. Patient states after eating breakfast she was on the same with a friend trying to schedule a lunch outing when she developed central chest pain localized to her midsternal region. She reports associated nausea, vomiting, diaphoresis, shortness of breath, and lightheadedness. She denies any syncope.  States she has long-standing history of esophageal spasms managed with nitroglycerin. States she took 1 nitroglycerin but it dropped her blood pressure today she did not take any further. States generally when this occurs it does not have any other associated symptoms aside from pain. She became concerned so she called EMS.  Patient has no known cardiac history. She reports extensive cardiac evaluation several years ago prior to the diagnosis of esophageal spasms, reports it was all normal. Her father did die of a heart attack at age 24. She has never been a smoker. Patient reports current discomfort is minimal, all other symptoms have resolved at this time. She did receive full dose aspirin with EMS.  Of note, patient recently went on a road trip with her husband to [place]. She states they were they're visiting friends. States she has been experiencing some calf pain which feels like muscle cramps. States is not necessarily abnormal for her. No history of DVT or PE. She is not currently on any type of anticoagulation.  Past Medical History:  Diagnosis Date  . Anxiety   . Anxiety disorder   . Cervical dysplasia    a. 2010 - high grade squamous  intraepithelial lesion s/p excision.  . Chest pain    a. 2006 or 2007 Cath [facility]): reportedly nl cors;  b. 09/2011 ETT: nl.  . GERD (gastroesophageal reflux disease)   . Herpes zoster   . Hypothyroidism   . Orthostatic hypotension   . Pre-diabetes   . RBBB   . Vertigo     Patient Active Problem List   Diagnosis Date Noted  . Hot flushes, perimenopausal 01/05/2016  . Subacromial bursitis 09/27/2015  . Right shoulder pain 09/25/2015  . RBBB   . Chest pain   . Orthostatic hypotension   . Nonspecific abnormal electrocardiogram (ECG) (EKG) 07/01/2015  . Shoulder pain, left 04/10/2015  . OSA (obstructive sleep apnea) 11/22/2013  . Fracture of multiple ribs 05/30/2013  . Gait disturbance 05/30/2013  . History of pernicious anemia 01/11/2013  . Hypovitaminosis D 01/11/2013  . Deviated septum 01/11/2013  . Hyperlipidemia 09/04/2011  . Abnormal cells of cervix 08/27/2011  . Multinodular goiter 08/27/2011  . Screening for breast cancer 03/13/2011  . Iatrogenic hypothyroidism   . Diabetes mellitus without complication ([place])   . GERD (gastroesophageal reflux disease)   . Anxiety with obsessional features     Past Surgical History:  Procedure Laterality Date  . CARDIAC CATHETERIZATION  2003   Dr.Callwood, R/L heart cath,  . THYROIDECTOMY  05/15/15   [facility]    OB History    No data available       Home Medications    Prior to Admission medications   Medication Sig Start Date End Date Taking?  Authorizing Provider  ALPRAZolam Duanne Moron) 0.5 MG tablet Take 1 tablet (0.5 mg total) by mouth at bedtime as needed for anxiety. 07/17/15  Yes Crecencio Mc, MD  Cholecalciferol (VITAMIN D) 2000 UNITS CAPS Take 1 capsule by mouth daily.     Yes Historical Provider, MD  ezetimibe (ZETIA) 10 MG tablet Take 1 tablet by mouth  daily 12/18/15  Yes Crecencio Mc, MD  levothyroxine (SYNTHROID, LEVOTHROID) 100 MCG tablet Take 100 mcg by mouth daily before breakfast.   Yes Historical Provider, MD    meclizine (ANTIVERT) 25 MG tablet TAKE ONE TABLET BY MOUTH THREE TIMES DAILY AS NEEDED FOR VERTIGO 11/23/15  Yes Crecencio Mc, MD  Misc Natural Products (TART CHERRY ADVANCED) CAPS Take 1 capsule by mouth daily.    Yes Historical Provider, MD  nitroGLYCERIN (NITROSTAT) 0.4 MG SL tablet Place 1 tablet (0.4 mg total) under the tongue every 5 (five) minutes as needed for chest pain. 07/12/15  Yes Crecencio Mc, MD  omeprazole (PRILOSEC) 40 MG capsule Take 1 capsule by mouth  daily 06/15/15  Yes Crecencio Mc, MD  sertraline (ZOLOFT) 100 MG tablet Take 1.5 tablets (150 mg total) by mouth daily. 02/05/16  Yes Crecencio Mc, MD  glucose blood test strip For One touch Verio Flex .  Use to check 3 times daily   e11.9 06/29/15   Crecencio Mc, MD  levothyroxine (SYNTHROID, LEVOTHROID) 88 MCG tablet Take 1 tablet (88 mcg total) by mouth daily before breakfast. Patient not taking: Reported on 02/26/2016 02/21/16   Crecencio Mc, MD    Family History Family History  Problem Relation Age of Onset  . Heart attack Mother   . Alcohol abuse Mother   . Mental illness Mother     died in her 3's of alzheimer's Dementia  . Heart disease Mother 32  . Heart attack Father   . Heart disease Father 32    AMI - died @ 89.  . Colon cancer Paternal Grandmother 44    Social History Social History  Substance Use Topics  . Smoking status: Never Smoker  . Smokeless tobacco: Never Used  . Alcohol use Yes     Comment: wine once a week     Allergies   Atorvastatin; Codeine; Fentanyl; Lipitor [atorvastatin calcium]; and Tetracyclines & related   Review of Systems Review of Systems  Cardiovascular: Positive for chest pain.  All other systems reviewed and are negative.    Physical Exam Updated Vital Signs BP 114/64   Pulse 66   Temp 97.8 F (36.6 C) (Oral)   Resp 11   SpO2 98%   Physical Exam  Constitutional: She is oriented to person, place, and time. She appears well-developed and  well-nourished.  HENT:  Head: Normocephalic and atraumatic.  Mouth/Throat: Oropharynx is clear and moist.  Eyes: Conjunctivae and EOM are normal. Pupils are equal, round, and reactive to light.  Neck: Normal range of motion.  Cardiovascular: Normal rate, regular rhythm and normal heart sounds.   Pulmonary/Chest: Effort normal and breath sounds normal.  Abdominal: Soft. Bowel sounds are normal.  Musculoskeletal: Normal range of motion.  No pitting edema No calf asymmetry, tenderness, or palpable cords, no overlying erythema or warmth to touch; DP pulses intact bilaterally  Neurological: She is alert and oriented to person, place, and time.  Skin: Skin is warm and dry.  Psychiatric: She has a normal mood and affect.  Nursing note and vitals reviewed.    ED Treatments /  Results  Labs (all labs ordered are listed, but only abnormal results are displayed) Labs Reviewed  CBC WITH DIFFERENTIAL/PLATELET  BASIC METABOLIC PANEL  D-DIMER, QUANTITATIVE (NOT AT [facility])  BASIC METABOLIC PANEL  CBC WITH DIFFERENTIAL/PLATELET  D-DIMER, QUANTITATIVE (NOT AT [facility])  I-STAT TROPOININ, ED  I-STAT TROPOININ, ED    EKG  EKG Interpretation  Date/Time:  Tuesday February 26 2016 12:33:55 EDT Ventricular Rate:  65 PR Interval:    QRS Duration: 122 QT Interval:  455 QTC Calculation: 474 R Axis:   -68 Text Interpretation:  Sinus rhythm RBBB and LAFB T wave abnormality No significant change since last tracing Artifact Abnormal ekg Confirmed by Carmin Muskrat  MD ([id number]) on 02/26/2016 12:43:30 PM       Radiology Dg Chest 2 View  Result Date: 02/26/2016 CLINICAL DATA:  Chest pain, dizziness, shortness of breath since 10 a.m. EXAM: CHEST  2 VIEW COMPARISON:  05/26/2013 FINDINGS: Heart is normal size. Linear atelectasis or scarring at the left base. Right lung is clear. No effusions. No acute bony abnormality. Bilateral calcified breast implants noted. IMPRESSION: Left basilar scarring or  atelectasis. No active disease. Electronically Signed   By: Rolm Baptise M.D.   On: 02/26/2016 13:20    Procedures Procedures (including critical care time)  Medications Ordered in ED Medications - No data to display   Initial Impression / Assessment and Plan / ED Course  I have reviewed the triage vital signs and the nursing notes.  Pertinent labs & imaging results that were available during my care of the patient were reviewed by me and considered in my medical decision making (see chart for details).  Clinical Course   75 year old female here with chest pain. Reports history of esophageal spasms, however usually are not accompanied with nausea, vomiting, diaphoresis, shortness of breath, or lightheadedness which occurred today.  EKG without acute ischemic changes. Patient does report recent long distance car ride to [place] as well as some calf pain/cramping.  Will obtain labs including troponin and d-dimer.  2:18 PM Labs overall reassuring aside from elevated d-dimer at 0.91.  Trop negative.  CXR clear.  Will obtain CTA chest to assess for PE.  Will also obtain delta troponin given some atypical factors of her symptoms today.  3:58 PM Delta troponin has been ordered.  CTA pending.  If both of these are negative, feel patient can be safely discharged home to follow-up with her PCP.  Care signed out to oncoming provider at this time.  Case discussed with attending physician, Dr. Vanita Panda, who evaluated patient and agrees with assessment and plan of care.  Final Clinical Impressions(s) / ED Diagnoses   Final diagnoses:  Chest pain, unspecified chest pain type    New Prescriptions New Prescriptions   No medications on file     Larene Pickett, Hershal Coria 02/26/16 1559    Carmin Muskrat, MD 02/28/16 1123

## 2016-02-26 NOTE — ED Notes (Signed)
PA at bedside.

## 2016-02-27 ENCOUNTER — Encounter: Payer: Self-pay | Admitting: Internal Medicine

## 2016-02-28 ENCOUNTER — Other Ambulatory Visit: Payer: Self-pay | Admitting: Internal Medicine

## 2016-02-28 DIAGNOSIS — R0789 Other chest pain: Secondary | ICD-10-CM

## 2016-02-28 DIAGNOSIS — R1310 Dysphagia, unspecified: Secondary | ICD-10-CM

## 2016-02-29 ENCOUNTER — Encounter: Payer: Self-pay | Admitting: Internal Medicine

## 2016-02-29 ENCOUNTER — Other Ambulatory Visit: Payer: Self-pay | Admitting: Internal Medicine

## 2016-02-29 DIAGNOSIS — K209 Esophagitis, unspecified without bleeding: Secondary | ICD-10-CM

## 2016-02-29 DIAGNOSIS — R1310 Dysphagia, unspecified: Secondary | ICD-10-CM

## 2016-02-29 NOTE — Telephone Encounter (Signed)
Pt scheduled for 9/26 for her DG esophagus. She wants to know if she has to do this or can she go straight to the endoscopy.  FYI, I don't know how soon they can get her in for a consult prior to the endoscopy that she is requesting.

## 2016-03-04 ENCOUNTER — Encounter: Payer: Self-pay | Admitting: Internal Medicine

## 2016-03-04 ENCOUNTER — Ambulatory Visit
Admission: RE | Admit: 2016-03-04 | Discharge: 2016-03-04 | Disposition: A | Discharge status: Home / Self Care | Payer: Medicare Other | Source: Ambulatory Visit | Attending: Internal Medicine | Admitting: Internal Medicine

## 2016-03-04 DIAGNOSIS — K209 Esophagitis, unspecified without bleeding: Secondary | ICD-10-CM

## 2016-03-04 DIAGNOSIS — K449 Diaphragmatic hernia without obstruction or gangrene: Secondary | ICD-10-CM | POA: Insufficient documentation

## 2016-03-04 DIAGNOSIS — R1310 Dysphagia, unspecified: Secondary | ICD-10-CM

## 2016-03-04 DIAGNOSIS — K224 Dyskinesia of esophagus: Secondary | ICD-10-CM | POA: Diagnosis not present

## 2016-03-05 ENCOUNTER — Encounter: Payer: Self-pay | Admitting: Internal Medicine

## 2016-03-17 DIAGNOSIS — L570 Actinic keratosis: Secondary | ICD-10-CM | POA: Diagnosis not present

## 2016-03-27 LAB — COLOGUARD: Cologuard: NEGATIVE

## 2016-04-08 ENCOUNTER — Encounter: Payer: Self-pay | Admitting: Internal Medicine

## 2016-04-08 DIAGNOSIS — R1319 Other dysphagia: Secondary | ICD-10-CM | POA: Diagnosis not present

## 2016-04-08 DIAGNOSIS — R05 Cough: Secondary | ICD-10-CM | POA: Insufficient documentation

## 2016-04-08 DIAGNOSIS — K219 Gastro-esophageal reflux disease without esophagitis: Secondary | ICD-10-CM | POA: Diagnosis not present

## 2016-04-08 DIAGNOSIS — R0789 Other chest pain: Secondary | ICD-10-CM | POA: Diagnosis not present

## 2016-04-08 DIAGNOSIS — R059 Cough, unspecified: Secondary | ICD-10-CM | POA: Insufficient documentation

## 2016-04-08 DIAGNOSIS — R1310 Dysphagia, unspecified: Secondary | ICD-10-CM | POA: Insufficient documentation

## 2016-04-08 DIAGNOSIS — R058 Other specified cough: Secondary | ICD-10-CM | POA: Insufficient documentation

## 2016-04-10 ENCOUNTER — Encounter: Payer: Self-pay | Admitting: Internal Medicine

## 2016-04-14 DIAGNOSIS — L570 Actinic keratosis: Secondary | ICD-10-CM | POA: Diagnosis not present

## 2016-05-05 ENCOUNTER — Encounter: Payer: Self-pay | Admitting: Internal Medicine

## 2016-05-07 ENCOUNTER — Other Ambulatory Visit: Payer: Self-pay

## 2016-05-07 NOTE — Telephone Encounter (Signed)
Last OV was 8/1, and last refill was in July2017.  Please advise for refill, thanks

## 2016-05-08 MED ORDER — ALPRAZOLAM 0.5 MG PO TABS: 0.5000 mg | ORAL_TABLET | Freq: Every evening | ORAL | 5 refills | Status: DC | PRN

## 2016-05-21 ENCOUNTER — Encounter: Payer: Self-pay | Admitting: Internal Medicine

## 2016-05-26 DIAGNOSIS — H52223 Regular astigmatism, bilateral: Secondary | ICD-10-CM | POA: Diagnosis not present

## 2016-05-26 DIAGNOSIS — E119 Type 2 diabetes mellitus without complications: Secondary | ICD-10-CM | POA: Diagnosis not present

## 2016-05-26 DIAGNOSIS — H1852 Epithelial (juvenile) corneal dystrophy: Secondary | ICD-10-CM | POA: Diagnosis not present

## 2016-05-27 ENCOUNTER — Telehealth: Payer: Self-pay | Admitting: Internal Medicine

## 2016-05-27 ENCOUNTER — Ambulatory Visit (INDEPENDENT_AMBULATORY_CARE_PROVIDER_SITE_OTHER): Payer: Medicare Other

## 2016-05-27 VITALS — BP 110/70 | HR 69 | Temp 97.9°F | Resp 12 | Ht 65.5 in | Wt 162.4 lb

## 2016-05-27 DIAGNOSIS — K2100 Gastro-esophageal reflux disease with esophagitis, without bleeding: Secondary | ICD-10-CM

## 2016-05-27 DIAGNOSIS — Z0000 Encounter for general adult medical examination without abnormal findings: Secondary | ICD-10-CM

## 2016-05-27 NOTE — Patient Instructions (Addendum)
Megan Bradley , Thank you for taking time to come for your Medicare Wellness Visit. I appreciate your ongoing commitment to your health goals. Please review the following plan we discussed and let me know if I can assist you in the future.   Follow up with Dr. Derrel Nip as needed.  Merry Christmas!!  These are the goals we discussed: Goals    . Increase lean proteins          Low carb foods. Lean meats (chicken, [place], fish), vegetables.  Educational material provided.    . Increase physical activity          Walk 1.5-2 miles for 4 days a week or as tolerated.      . Increase water intake          Increase water intake up to 3 bottles =6 cups daily.       This is a list of the screening recommended for you and due dates:  Health Maintenance  Topic Date Due  . Colon Cancer Screening  01/12/2015  . Shingles Vaccine  06/08/2017*  . Hemoglobin A1C  07/04/2016  . Complete foot exam   01/01/2017  . Urine Protein Check  01/01/2017  . DEXA scan (bone density measurement)  01/29/2017  . Eye exam for diabetics  05/20/2017  . Mammogram  12/25/2017  . Tetanus Vaccine  06/07/2022  . Flu Shot  Completed  . Pneumonia vaccines  Completed  *Topic was postponed. The date shown is not the original due date.      Fall Prevention in the Home Introduction Falls can cause injuries. They can happen to people of all ages. There are many things you can do to make your home safe and to help prevent falls. What can I do on the outside of my home?  Regularly fix the edges of walkways and driveways and fix any cracks.  Remove anything that might make you trip as you walk through a door, such as a raised step or threshold.  Trim any bushes or trees on the path to your home.  Use bright outdoor lighting.  Clear any walking paths of anything that might make someone trip, such as rocks or tools.  Regularly check to see if handrails are loose or broken. Make sure that both sides of any steps have  handrails.  Any raised decks and porches should have guardrails on the edges.  Have any leaves, snow, or ice cleared regularly.  Use sand or salt on walking paths during winter.  Clean up any spills in your garage right away. This includes oil or grease spills. What can I do in the bathroom?  Use night lights.  Install grab bars by the toilet and in the tub and shower. Do not use towel bars as grab bars.  Use non-skid mats or decals in the tub or shower.  If you need to sit down in the shower, use a plastic, non-slip stool.  Keep the floor dry. Clean up any water that spills on the floor as soon as it happens.  Remove soap buildup in the tub or shower regularly.  Attach bath mats securely with double-sided non-slip rug tape.  Do not have throw rugs and other things on the floor that can make you trip. What can I do in the bedroom?  Use night lights.  Make sure that you have a light by your bed that is easy to reach.  Do not use any sheets or blankets that are too big  for your bed. They should not hang down onto the floor.  Have a firm chair that has side arms. You can use this for support while you get dressed.  Do not have throw rugs and other things on the floor that can make you trip. What can I do in the kitchen?  Clean up any spills right away.  Avoid walking on wet floors.  Keep items that you use a lot in easy-to-reach places.  If you need to reach something above you, use a strong step stool that has a grab bar.  Keep electrical cords out of the way.  Do not use floor polish or wax that makes floors slippery. If you must use wax, use non-skid floor wax.  Do not have throw rugs and other things on the floor that can make you trip. What can I do with my stairs?  Do not leave any items on the stairs.  Make sure that there are handrails on both sides of the stairs and use them. Fix handrails that are broken or loose. Make sure that handrails are as long as  the stairways.  Check any carpeting to make sure that it is firmly attached to the stairs. Fix any carpet that is loose or worn.  Avoid having throw rugs at the top or bottom of the stairs. If you do have throw rugs, attach them to the floor with carpet tape.  Make sure that you have a light switch at the top of the stairs and the bottom of the stairs. If you do not have them, ask someone to add them for you. What else can I do to help prevent falls?  Wear shoes that:  Do not have high heels.  Have rubber bottoms.  Are comfortable and fit you well.  Are closed at the toe. Do not wear sandals.  If you use a stepladder:  Make sure that it is fully opened. Do not climb a closed stepladder.  Make sure that both sides of the stepladder are locked into place.  Ask someone to hold it for you, if possible.  Clearly mark and make sure that you can see:  Any grab bars or handrails.  First and last steps.  Where the edge of each step is.  Use tools that help you move around (mobility aids) if they are needed. These include:  Canes.  Walkers.  Scooters.  Crutches.  Turn on the lights when you go into a dark area. Replace any light bulbs as soon as they burn out.  Set up your furniture so you have a clear path. Avoid moving your furniture around.  If any of your floors are uneven, fix them.  If there are any pets around you, be aware of where they are.  Review your medicines with your doctor. Some medicines can make you feel dizzy. This can increase your chance of falling. Ask your doctor what other things that you can do to help prevent falls. This information is not intended to replace advice given to you by your health care provider. Make sure you discuss any questions you have with your health care provider. Document Released: 03/22/2009 Document Revised: 11/01/2015 Document Reviewed: 06/30/2014  2017 Elsevier

## 2016-05-27 NOTE — Progress Notes (Signed)
Subjective:   Megan Bradley is a 75 y.o. female who presents for Medicare Annual (Subsequent) preventive examination.  Review of Systems:  No ROS.  Medicare Wellness Visit.  Cardiac Risk Factors include: advanced age (>60men, >19 women);diabetes mellitus;hypertension     Objective:     Vitals: BP 110/70 (BP Location: Left Arm, Patient Position: Sitting, Cuff Size: Normal)   Pulse 69   Temp 97.9 F (36.6 C) (Oral)   Resp 12   Ht 5' 5.5" (1.664 m)   Wt 162 lb 6.4 oz (73.7 kg)   SpO2 96%   BMI 26.61 kg/m   Body mass index is 26.61 kg/m.   Tobacco History  Smoking Status  . Never Smoker  Smokeless Tobacco  . Never Used     Counseling given: Not Answered   Past Medical History:  Diagnosis Date  . Anxiety   . Anxiety disorder   . Cervical dysplasia    a. 2010 - high grade squamous intraepithelial lesion s/p excision.  . Chest pain    a. 2006 or 2007 Cath [facility]): reportedly nl cors;  b. 09/2011 ETT: nl.  . GERD (gastroesophageal reflux disease)   . Herpes zoster   . Hypothyroidism   . Orthostatic hypotension   . Pre-diabetes   . RBBB   . Vertigo    Past Surgical History:  Procedure Laterality Date  . CARDIAC CATHETERIZATION  2003   Dr.Callwood, R/L heart cath,  . THYROIDECTOMY  05/15/15   [facility]   Family History  Problem Relation Age of Onset  . Heart attack Mother   . Alcohol abuse Mother   . Mental illness Mother     died in her 37's of alzheimer's Dementia  . Heart disease Mother 33  . Heart attack Father   . Heart disease Father 90    AMI - died @ 36.  . Colon cancer Paternal Grandmother 5   History  Sexual Activity  . Sexual activity: No    Outpatient Encounter Prescriptions as of 05/27/2016  Medication Sig  . ALPRAZolam (XANAX) 0.5 MG tablet Take 1 tablet (0.5 mg total) by mouth at bedtime as needed for anxiety.  . Cholecalciferol (VITAMIN D) 2000 UNITS CAPS Take 1 capsule by mouth daily.    Marland Kitchen ezetimibe (ZETIA) 10 MG tablet Take 1  tablet by mouth  daily  . glucose blood test strip For One touch Verio Flex .  Use to check 3 times daily   e11.9  . levothyroxine (SYNTHROID, LEVOTHROID) 88 MCG tablet Take 1 tablet (88 mcg total) by mouth daily before breakfast.  . meclizine (ANTIVERT) 25 MG tablet TAKE ONE TABLET BY MOUTH THREE TIMES DAILY AS NEEDED FOR VERTIGO  . Misc Natural Products (TART CHERRY ADVANCED) CAPS Take 1 capsule by mouth daily.   . nitroGLYCERIN (NITROSTAT) 0.4 MG SL tablet Place 1 tablet (0.4 mg total) under the tongue every 5 (five) minutes as needed for chest pain.  Marland Kitchen omeprazole (PRILOSEC) 40 MG capsule Take 1 capsule by mouth  daily  . sertraline (ZOLOFT) 100 MG tablet Take 1.5 tablets (150 mg total) by mouth daily.  . [DISCONTINUED] levothyroxine (SYNTHROID, LEVOTHROID) 100 MCG tablet Take 100 mcg by mouth daily before breakfast.   Facility-Administered Encounter Medications as of 05/27/2016  Medication  . lidocaine (XYLOCAINE) 1 % (with pres) injection 4 mL  . triamcinolone acetonide (KENALOG-40) injection 20 mg  . triamcinolone acetonide (KENALOG-40) injection 20 mg    Activities of Daily Living In your present state of  health, do you have any difficulty performing the following activities: 05/27/2016  Hearing? N  Vision? N  Difficulty concentrating or making decisions? N  Walking or climbing stairs? N  Dressing or bathing? N  Doing errands, shopping? N  Preparing Food and eating ? N  Using the Toilet? N  In the past six months, have you accidently leaked urine? N  Do you have problems with loss of bowel control? N  Managing your Medications? N  Managing your Finances? N  Housekeeping or managing your Housekeeping? N  Some recent data might be hidden    Patient Care Team: Crecencio Mc, MD as PCP - General (Internal Medicine)    Assessment:    This is a routine wellness examination for [place]. The goal of the wellness visit is to assist the patient how to close the gaps in care and  create a preventative care plan for the patient.   Taking calcium VIT D as appropriate/Osteoporosis risk reviewed.  Medications reviewed; taking without issues or barriers.  Safety issues reviewed; smoke detectors in the home. Firearms locked in a safe within the home. Wears seatbelts when driving or riding with others. No violence in the home.  No identified risk were noted; The patient was oriented x 3; appropriate in dress and manner and no objective failures at ADL's or IADL's.   BMI; discussed the importance of a healthy diet, water intake and exercise. Educational material provided.  Patient Concerns: None at this time. Follow up with PCP as needed.  Exercise Activities and Dietary recommendations Current Exercise Habits: The patient does not participate in regular exercise at present  Goals    . Increase lean proteins          Low carb foods. Lean meats (chicken, [place], fish), vegetables.  Educational material provided.    . Increase physical activity          Walk 1.5-2 miles for 4 days a week or as tolerated.      . Increase water intake          Increase water intake up to 3 bottles =6 cups daily.      Fall Risk Fall Risk  05/27/2016 04/18/2015 06/09/2014 06/07/2014 10/14/2013  Falls in the past year? Yes No No No Yes  Number falls in past yr: 1 - - - 1  Injury with Fall? Yes - - - Yes  Risk Factor Category  - - - - High Fall Risk  Follow up Education provided;Falls prevention discussed - - - -   Depression Screen PHQ 2/9 Scores 05/27/2016 04/18/2015 06/09/2014 06/07/2014  PHQ - 2 Score 0 0 0 0     Cognitive Function MMSE - Mini Mental State Exam 04/18/2015  Orientation to time 5  Orientation to Place 5  Registration 3  Attention/ Calculation 5  Recall 3  Language- name 2 objects 2  Language- repeat 1  Language- follow 3 step command 3  Language- read & follow direction 1  Write a sentence 1  Copy design 1  Total score 30     6CIT Screen  05/27/2016  What Year? 0 points  What month? 0 points  What time? 0 points  Count back from 20 0 points  Months in reverse 0 points    Immunization History  Administered Date(s) Administered  . Influenza Split 04/14/2011, 03/02/2012  . Influenza, High Dose Seasonal PF 02/18/2016  . Influenza,inj,Quad PF,36+ Mos 03/30/2013, 03/04/2014, 03/02/2015  . Pneumococcal Conjugate-13 06/07/2014  . Pneumococcal  Polysaccharide-23 06/07/2008  . Tdap 06/07/2012   Screening Tests Health Maintenance  Topic Date Due  . ZOSTAVAX  06/08/2017 (Originally 07/18/2002)  . COLONOSCOPY  06/09/2019 (Originally 01/12/2015)  . HEMOGLOBIN A1C  07/04/2016  . FOOT EXAM  01/01/2017  . URINE MICROALBUMIN  01/01/2017  . DEXA SCAN  01/29/2017  . OPHTHALMOLOGY EXAM  05/20/2017  . MAMMOGRAM  12/25/2017  . TETANUS/TDAP  06/07/2022  . INFLUENZA VACCINE  Completed  . PNA vac Low Risk Adult  Completed      Plan:    End of life planning; Advance aging; Advanced directives discussed. Copy of current HCPOA/Living Will requested.  Medicare Attestation I have personally reviewed: The patient's medical and social history Their use of alcohol, tobacco or illicit drugs Their current medications and supplements The patient's functional ability including ADLs,fall risks, home safety risks, cognitive, and hearing and visual impairment Diet and physical activities Evidence for depression   The patient's weight, height, BMI, and visual acuity have been recorded in the chart.  I have made referrals and provided education to the patient based on review of the above and I have provided the patient with a written personalized care plan for preventive services.    During the course of the visit the patient was educated and counseled about the following appropriate screening and preventive services:   Vaccines to include Pneumoccal, Influenza, Hepatitis B, Td, Zostavax, HCV  Electrocardiogram  Cardiovascular  Disease  Colorectal cancer screening  Bone density screening  Diabetes screening  Glaucoma screening  Mammography/PAP  Nutrition counseling   Patient Instructions (the written plan) was given to the patient.   Varney Biles, LPN  [id number]

## 2016-05-27 NOTE — Progress Notes (Signed)
Care was provided under my supervision. I agree with the management as indicated in the note.  Leily Capek DO  

## 2016-05-27 NOTE — Telephone Encounter (Signed)
Done. mychart message sent

## 2016-06-06 NOTE — Progress Notes (Signed)
I have reviewed the above note and agree.  Ryden Wainer, M.D.  

## 2016-06-17 ENCOUNTER — Encounter: Payer: Self-pay | Admitting: Gastroenterology

## 2016-06-17 ENCOUNTER — Ambulatory Visit (INDEPENDENT_AMBULATORY_CARE_PROVIDER_SITE_OTHER): Payer: Medicare Other | Admitting: Gastroenterology

## 2016-06-17 ENCOUNTER — Other Ambulatory Visit: Payer: Self-pay

## 2016-06-17 VITALS — BP 136/67 | HR 79 | Temp 98.2°F | Ht 66.0 in | Wt 159.5 lb

## 2016-06-17 DIAGNOSIS — R1310 Dysphagia, unspecified: Secondary | ICD-10-CM | POA: Diagnosis not present

## 2016-06-17 DIAGNOSIS — R0789 Other chest pain: Secondary | ICD-10-CM

## 2016-06-17 NOTE — Patient Instructions (Signed)
You are scheduled for a Esophageal Manometry at [facility] unit on Thursday, Jan 11th @ 12:30. Please check in at the medical mall entrance by 12:15.

## 2016-06-17 NOTE — Progress Notes (Signed)
Gastroenterology Consultation  Referring Provider:     Crecencio Mc, MD Primary Care Physician:  Crecencio Mc, MD Primary Gastroenterologist:  Dr. Allen Norris     Reason for Consultation:     Chest pain        HPI:   Megan Bradley is a 76 y.o. y/o female referred for consultation & management of Chest pain by Dr. Crecencio Mc, MD.  This patient comes in today after seeing me many years ago for chest discomfort. The patient had an episode which necessitated her going to the emergency room for her chest pain. The patient states that it had started shortly after eating and was so severe that she called EMS. She reports that she has had intermittent symptoms for some time. The patient has been on omeprazole for this but continues to have her symptoms. The patient also reports that she has an intermittent cough. The patient was seen by a nurse practitioner at Dr. Marton Redwood office but she did not want to return there. She reports that her symptoms are sometimes exacerbated by drinking hot fluids. There is no report of any unexplained weight loss, fevers, chills, nausea or vomiting. The patient also reports that the pain sometimes starts in her neck and then travels down to her chest. She had a EKG which was stable but abnormal and she was recommended to see a cardiologist. She reports that she is not due for colonoscopy because she had a stool test that she states was normal.  Past Medical History:  Diagnosis Date  . Anxiety   . Anxiety disorder   . Cervical dysplasia    a. 2010 - high grade squamous intraepithelial lesion s/p excision.  . Chest pain    a. 2006 or 2007 [facility]): reportedly nl cors;  b. 09/2011 ETT: nl.  . GERD (gastroesophageal reflux disease)   . Herpes zoster   . Hypothyroidism   . Orthostatic hypotension   . Pre-diabetes   . RBBB   . Vertigo     Past Surgical History:  Procedure Laterality Date  . CARDIAC CATHETERIZATION  2003   Dr.Callwood, R/L heart cath,    . THYROIDECTOMY  05/15/15   [facility]    Prior to Admission medications   Medication Sig Start Date End Date Taking? Authorizing Provider  ALPRAZolam Duanne Moron) 0.5 MG tablet Take 1 tablet (0.5 mg total) by mouth at bedtime as needed for anxiety. 05/08/16  Yes Crecencio Mc, MD  Cholecalciferol (VITAMIN D) 2000 UNITS CAPS Take 1 capsule by mouth daily.     Yes Historical Provider, MD  ezetimibe (ZETIA) 10 MG tablet Take 1 tablet by mouth  daily 12/18/15  Yes Crecencio Mc, MD  glucose blood test strip For One touch Verio Flex .  Use to check 3 times daily   e11.9 06/29/15  Yes Crecencio Mc, MD  levothyroxine (SYNTHROID, LEVOTHROID) 88 MCG tablet Take 1 tablet (88 mcg total) by mouth daily before breakfast. 02/21/16  Yes Crecencio Mc, MD  meclizine (ANTIVERT) 25 MG tablet TAKE ONE TABLET BY MOUTH THREE TIMES DAILY AS NEEDED FOR VERTIGO 11/23/15  Yes Crecencio Mc, MD  Misc Natural Products (TART CHERRY ADVANCED) CAPS Take 1 capsule by mouth daily.    Yes Historical Provider, MD  nitroGLYCERIN (NITROSTAT) 0.4 MG SL tablet Place 1 tablet (0.4 mg total) under the tongue every 5 (five) minutes as needed for chest pain. 07/12/15  Yes Crecencio Mc, MD  omeprazole ([place])  40 MG capsule Take 1 capsule by mouth  daily 02/28/16  Yes Crecencio Mc, MD  sertraline (ZOLOFT) 100 MG tablet Take 1.5 tablets (150 mg total) by mouth daily. 02/05/16  Yes Crecencio Mc, MD    Family History  Problem Relation Age of Onset  . Heart attack Mother   . Alcohol abuse Mother   . Mental illness Mother     died in her 61's of alzheimer's Dementia  . Heart disease Mother 32  . Heart attack Father   . Heart disease Father 35    AMI - died @ 38.  . Colon cancer Paternal Grandmother 42     Social History  Substance Use Topics  . Smoking status: Never Smoker  . Smokeless tobacco: Never Used  . Alcohol use Yes     Comment: wine once a week    Allergies as of 06/17/2016 - Review Complete 06/17/2016  Allergen  Reaction Noted  . Atorvastatin Other (See Comments) 06/14/2013  . Codeine  09/12/2010  . Fentanyl Nausea Only 09/12/2010  . Lipitor [atorvastatin calcium]  09/12/2010  . Tetracyclines & related Hives 09/12/2010    Review of Systems:    All systems reviewed and negative except where noted in HPI.   Physical Exam:  BP 136/67   Pulse 79   Temp 98.2 F (36.8 C) (Oral)   Ht 5\' 6"  (1.676 m)   Wt 159 lb 8 oz (72.3 kg)   BMI 25.74 kg/m  No LMP recorded. Patient is postmenopausal. Psych:  Alert and cooperative. Normal mood and affect. General:   Alert,  Well-developed, well-nourished, pleasant and cooperative in NAD Head:  Normocephalic and atraumatic. Eyes:  Sclera clear, no icterus.   Conjunctiva pink. Ears:  Normal auditory acuity. Nose:  No deformity, discharge, or lesions. Mouth:  No deformity or lesions,oropharynx pink & moist. Neck:  Supple; no masses or thyromegaly. Lungs:  Respirations even and unlabored.  Clear throughout to auscultation.   No wheezes, crackles, or rhonchi. No acute distress. Heart:  Regular rate and rhythm; no murmurs, clicks, rubs, or gallops. Abdomen:  Normal bowel sounds.  No bruits.  Soft, non-tender and non-distended without masses, hepatosplenomegaly or hernias noted.  No guarding or rebound tenderness.  Negative Carnett sign.   Rectal:  Deferred.  Msk:  Symmetrical without gross deformities.  Good, equal movement & strength bilaterally. Pulses:  Normal pulses noted. Extremities:  No clubbing or edema.  No cyanosis. Neurologic:  Alert and oriented x3;  grossly normal neurologically. Skin:  Intact without significant lesions or rashes.  No jaundice. Lymph Nodes:  No significant cervical adenopathy. Psych:  Alert and cooperative. Normal mood and affect.  Imaging Studies: No results found.  Assessment and Plan:   Megan Bradley is a 76 y.o. y/o female who has a history of intermittent chest pain. The symptoms are most consistent with esophageal  spasms. The patient had a barium swallow that did not show any abnormality in her esophagus. The patient will be switched from the omeprazole to Dexilant 60 mg a day. This is to see if her spasms improve and to see if she has less coughing. The patient will also be set up for a esophageal manometry. The patient has been explained the plan and agrees with it.    Lucilla Lame, MD. [name]   Note: This dictation was prepared with Dragon dictation along with smaller phrase technology. Any transcriptional errors that result from this process are unintentional.

## 2016-06-18 ENCOUNTER — Encounter: Payer: Self-pay | Admitting: Internal Medicine

## 2016-06-18 ENCOUNTER — Encounter: Payer: Self-pay | Admitting: Gastroenterology

## 2016-06-19 ENCOUNTER — Encounter
Admission: RE | Disposition: A | Discharge status: Home / Self Care | Payer: Self-pay | Source: Ambulatory Visit | Attending: Gastroenterology

## 2016-06-19 ENCOUNTER — Ambulatory Visit
Admission: RE | Admit: 2016-06-19 | Discharge: 2016-06-19 | Disposition: A | Discharge status: Home / Self Care | Payer: Medicare Other | Source: Ambulatory Visit | Attending: Gastroenterology | Admitting: Gastroenterology

## 2016-06-19 DIAGNOSIS — K224 Dyskinesia of esophagus: Secondary | ICD-10-CM | POA: Insufficient documentation

## 2016-06-19 DIAGNOSIS — R1310 Dysphagia, unspecified: Secondary | ICD-10-CM | POA: Diagnosis present

## 2016-06-19 HISTORY — PX: ESOPHAGEAL MANOMETRY: SHX5429

## 2016-06-19 SURGERY — MANOMETRY, ESOPHAGUS

## 2016-06-19 MED ORDER — LIDOCAINE HCL 2 % EX GEL
CUTANEOUS | Status: AC
  Administered 2016-06-19: 1 via TOPICAL
  Filled 2016-06-19: qty 5

## 2016-06-19 MED ORDER — LIDOCAINE HCL 2 % EX GEL
1.0000 "application " | Freq: Once | CUTANEOUS | Status: AC
  Administered 2016-06-19: 1 via TOPICAL

## 2016-06-19 SURGICAL SUPPLY — 2 items
FACESHIELD LNG OPTICON STERILE (SAFETY) IMPLANT
GLOVE BIO SURGEON STRL SZ8 (GLOVE) ×6 IMPLANT

## 2016-06-20 ENCOUNTER — Other Ambulatory Visit: Payer: Self-pay | Admitting: Internal Medicine

## 2016-06-20 ENCOUNTER — Encounter: Payer: Self-pay | Admitting: Gastroenterology

## 2016-06-25 ENCOUNTER — Encounter: Payer: Self-pay | Admitting: Gastroenterology

## 2016-07-01 ENCOUNTER — Encounter: Payer: Self-pay | Admitting: Internal Medicine

## 2016-07-03 ENCOUNTER — Ambulatory Visit (INDEPENDENT_AMBULATORY_CARE_PROVIDER_SITE_OTHER): Payer: Medicare Other | Admitting: Cardiovascular Disease

## 2016-07-03 ENCOUNTER — Encounter: Payer: Self-pay | Admitting: Internal Medicine

## 2016-07-03 VITALS — BP 110/70 | HR 73 | Ht 65.5 in | Wt 156.8 lb

## 2016-07-03 DIAGNOSIS — R0789 Other chest pain: Secondary | ICD-10-CM

## 2016-07-03 DIAGNOSIS — E7800 Pure hypercholesterolemia, unspecified: Secondary | ICD-10-CM

## 2016-07-03 DIAGNOSIS — E119 Type 2 diabetes mellitus without complications: Secondary | ICD-10-CM

## 2016-07-03 DIAGNOSIS — I951 Orthostatic hypotension: Secondary | ICD-10-CM

## 2016-07-03 DIAGNOSIS — I700 Atherosclerosis of aorta: Secondary | ICD-10-CM | POA: Diagnosis not present

## 2016-07-03 DIAGNOSIS — E538 Deficiency of other specified B group vitamins: Secondary | ICD-10-CM

## 2016-07-03 DIAGNOSIS — E032 Hypothyroidism due to medicaments and other exogenous substances: Secondary | ICD-10-CM

## 2016-07-03 DIAGNOSIS — E559 Vitamin D deficiency, unspecified: Secondary | ICD-10-CM

## 2016-07-03 NOTE — Progress Notes (Signed)
Cardiology Office Note  Date:  07/03/2016   ID:  Megan Bradley, DOB Nov 29, 1940, MRN [id number]  PCP:  Crecencio Mc, MD   Chief Complaint  Patient presents with  . other    1 yr f/u. Meds reviewed verbally with pt.    HPI:  Megan Bradley is a 76 year old woman, patient of Dr. Derrel Nip with prior history of syncope  at least 4 episodes, symptoms improved by holding  lisinopril and HCTZ. History of chronic chest pain felt secondary to spasm, previous cardiac catheterization with no coronary disease into 2007,  She presents for routine followup of her chest pain History of multinodular goiter, had surgery  In follow-up she reports that several things have happened since her last clinic visit  She was concerned that her Trachea was  "bent" from  thyroid nodules  She thought that having thyroid surgery would help chest pain/choking Despite having thyroid removed, she is Still having pain She does have nitroglycerin and took  NTG x 2 once, "passed out" from low blood pressure   she reports that Cold and hot will trigger  mediastinal pain,  pills  also get stuck   She reports having an event 02/2016:  he really did not feel well, threw up,  felt sweaty, Called EMS, [facility] [place]   workup in the Er, had CT,No PE , EKG, lab work essentially normal, TNT negative  Saw Dr. Selmer Dominion follow-up  Had manometry study 2 weeks ago, results not available I'm swallow essentially normal  EKG shows normal sinus rhythm with rate 73 beats per minute with right bundle branch block, no other significant ST or T wave changes  Other past medical history reviewed  chest pain 2013, normal CT scan chest, normal exercise treadmill test, Normal LV function by echocardiogram  Previous echocardiogram many years ago showed diastolic dysfunction with normal LV function    PMH:   has a past medical history of Anxiety; Anxiety disorder; Cervical dysplasia; Chest pain; GERD (gastroesophageal reflux disease); Herpes  zoster; Hypothyroidism; Orthostatic hypotension; Pre-diabetes; RBBB; and Vertigo.  PSH:    Past Surgical History:  Procedure Laterality Date  . CARDIAC CATHETERIZATION  2003   Dr.Callwood, R/L heart cath,  . ESOPHAGEAL MANOMETRY N/A 06/19/2016   Procedure: ESOPHAGEAL MANOMETRY (EM);  Surgeon: Lucilla Lame, MD;  Location: [facility] ENDOSCOPY;  Service: Endoscopy;  Laterality: N/A;  . THYROIDECTOMY  05/15/15   [facility]    Current Outpatient Prescriptions  Medication Sig Dispense Refill  . ALPRAZolam (XANAX) 0.5 MG tablet Take 1 tablet (0.5 mg total) by mouth at bedtime as needed for anxiety. 30 tablet 5  . Cholecalciferol (VITAMIN D) 2000 UNITS CAPS Take 1 capsule by mouth daily.      Marland Kitchen ezetimibe (ZETIA) 10 MG tablet Take 1 tablet by mouth  daily 90 tablet 2  . glucose blood test strip For One touch Verio Flex .  Use to check 3 times daily   e11.9 100 each 0  . levothyroxine (SYNTHROID, LEVOTHROID) 88 MCG tablet TAKE 1 TABLET BY MOUTH  DAILY BEFORE BREAKFAST 90 tablet 1  . meclizine (ANTIVERT) 25 MG tablet TAKE ONE TABLET BY MOUTH THREE TIMES DAILY AS NEEDED FOR VERTIGO 30 tablet 1  . nitroGLYCERIN (NITROSTAT) 0.4 MG SL tablet Place 1 tablet (0.4 mg total) under the tongue every 5 (five) minutes as needed for chest pain. 50 tablet 3  . omeprazole (PRILOSEC) 40 MG capsule Take 1 capsule by mouth  daily 90 capsule 3  .  sertraline (ZOLOFT) 100 MG tablet Take 1.5 tablets (150 mg total) by mouth daily. 145 tablet 2   Current Facility-Administered Medications  Medication Dose Route Frequency Provider Last Rate Last Dose  . lidocaine (XYLOCAINE) 1 % (with pres) injection 4 mL  4 mL Other Once Crecencio Mc, MD      . triamcinolone acetonide (KENALOG-40) injection 20 mg  20 mg Intra-articular Once Crecencio Mc, MD      . triamcinolone acetonide (KENALOG-40) injection 20 mg  20 mg Intramuscular Once Crecencio Mc, MD         Allergies:   Atorvastatin; Codeine; Fentanyl; Lipitor [atorvastatin  calcium]; and Tetracyclines & related   Social History:  The patient  reports that she has never smoked. She has never used smokeless tobacco. She reports that she drinks alcohol. She reports that she does not use drugs.   Family History:   family history includes Alcohol abuse in her mother; Colon cancer (age of onset: 72) in her paternal grandmother; Heart attack in her father and mother; Heart disease (age of onset: 69) in her father; Heart disease (age of onset: 88) in her mother; Mental illness in her mother.    Review of Systems: Review of Systems  Constitutional: Negative.   Respiratory: Negative.   Cardiovascular: Positive for chest pain.  Gastrointestinal: Negative.   Musculoskeletal: Negative.   Neurological: Negative.   Psychiatric/Behavioral: Negative.   All other systems reviewed and are negative.    PHYSICAL EXAM: VS:  BP 110/70 (BP Location: Left Arm, Patient Position: Sitting, Cuff Size: Normal)   Pulse 73   Ht 5' 5.5" (1.664 m)   Wt 156 lb 12 oz (71.1 kg)   BMI 25.69 kg/m  , BMI Body mass index is 25.69 kg/m. GEN: Well nourished, well developed, in no acute distress  HEENT: normal  Neck: no JVD, carotid bruits, or masses Cardiac: RRR; no murmurs, rubs, or gallops,no edema  Respiratory:  clear to auscultation bilaterally, normal work of breathing GI: soft, nontender, nondistended, + BS MS: no deformity or atrophy  Skin: warm and dry, no rash Neuro:  Strength and sensation are intact Psych: euthymic mood, full affect    Recent Labs: 01/02/2016: ALT 15 02/18/2016: TSH 0.27; TSH 0.283 02/26/2016: BUN 20; Creatinine, Ser 1.03; Hemoglobin 12.5; Platelets 158; Potassium 3.8; Sodium 140    Lipid Panel Lab Results  Component Value Date   CHOL 176 01/02/2016   HDL 72.30 01/02/2016   LDLCALC 96 01/02/2016   TRIG 42.0 01/02/2016    Hemoglobin A1c 6.12 December 2015  Wt Readings from Last 3 Encounters:  07/03/16 156 lb 12 oz (71.1 kg)  06/19/16 159 lb (72.1 kg)   06/17/16 159 lb 8 oz (72.3 kg)       ASSESSMENT AND PLAN:  Pure hypercholesterolemia Currently on Zetia, given minimal aortic plaque, likely do not need to start a statin  Aortic arch atherosclerosis (HCC) CT scan images reviewed with her in detail, no significant coronary calcifications, no calcified plaque noted in the ascending or descending aorta  Other chest pain Suspect symptoms secondary to esophageal spasm Recommended she cut nitroglycerin in half or take one pill but be in supine position Suggested she talk with Dr. Derrel Nip  about possibly a trial of Levsin  Diabetes mellitus without complication ([place]) We have encouraged continued exercise, careful diet management in an effort to lose weight.  Orthostatic hypotension  she denies any recent episodes of orthostasis or syncope /near syncope   recommended she  stay hydrated   Total encounter time more than 25 minutes  Greater than 50% was spent in counseling and coordination of care with the patient   Disposition:   F/U  6 months  No orders of the defined types were placed in this encounter.    Signed, Esmond Plants, M.D., Ph.D. 07/03/2016  [place], [place]

## 2016-07-03 NOTE — Patient Instructions (Addendum)
Medication Instructions:   Ask Dr. Derrel Nip about [facility] for spasm Cut the NTG in 1/2 as needed  Labwork:  No new labs needed  Testing/Procedures:  No further testing at this time   I recommend watching educational videos on topics of interest to you at:       [email or web address]  Enter code: [email or web address]    Follow-Up: It was a pleasure seeing you in the office today. Please call us if you have new issues that need to be addressed before your next appt.  [phone]  Your physician wants you to follow-up in: 12 months.  You will receive a reminder letter in the mail two months in advance. If you don't receive a letter, please call our office to schedule the follow-up appointment.  If you need a refill on your cardiac medications before your next appointment, please call your pharmacy.

## 2016-07-07 ENCOUNTER — Encounter: Payer: Self-pay | Admitting: Gastroenterology

## 2016-07-09 ENCOUNTER — Other Ambulatory Visit (INDEPENDENT_AMBULATORY_CARE_PROVIDER_SITE_OTHER): Payer: Medicare Other | Admitting: *Deleted

## 2016-07-09 DIAGNOSIS — I4510 Unspecified right bundle-branch block: Secondary | ICD-10-CM

## 2016-07-16 ENCOUNTER — Ambulatory Visit (INDEPENDENT_AMBULATORY_CARE_PROVIDER_SITE_OTHER): Payer: Medicare Other | Admitting: Internal Medicine

## 2016-07-16 ENCOUNTER — Telehealth: Payer: Self-pay | Admitting: Internal Medicine

## 2016-07-16 DIAGNOSIS — E559 Vitamin D deficiency, unspecified: Secondary | ICD-10-CM

## 2016-07-16 DIAGNOSIS — E7800 Pure hypercholesterolemia, unspecified: Secondary | ICD-10-CM | POA: Diagnosis not present

## 2016-07-16 DIAGNOSIS — E538 Deficiency of other specified B group vitamins: Secondary | ICD-10-CM

## 2016-07-16 DIAGNOSIS — E119 Type 2 diabetes mellitus without complications: Secondary | ICD-10-CM

## 2016-07-16 DIAGNOSIS — E032 Hypothyroidism due to medicaments and other exogenous substances: Secondary | ICD-10-CM | POA: Diagnosis not present

## 2016-07-16 LAB — COMPREHENSIVE METABOLIC PANEL WITH GFR
ALT: 14 U/L (ref 0–35)
AST: 16 U/L (ref 0–37)
Albumin: 4.1 g/dL (ref 3.5–5.2)
Alkaline Phosphatase: 48 U/L (ref 39–117)
BUN: 20 mg/dL (ref 6–23)
CO2: 30 meq/L (ref 19–32)
Calcium: 9.3 mg/dL (ref 8.4–10.5)
Chloride: 109 meq/L (ref 96–112)
Creatinine, Ser: 0.98 mg/dL (ref 0.40–1.20)
GFR: 58.96 mL/min — ABNORMAL LOW
Glucose, Bld: 118 mg/dL — ABNORMAL HIGH (ref 70–99)
Potassium: 4.6 meq/L (ref 3.5–5.1)
Sodium: 144 meq/L (ref 135–145)
Total Bilirubin: 0.5 mg/dL (ref 0.2–1.2)
Total Protein: 6.5 g/dL (ref 6.0–8.3)

## 2016-07-16 LAB — LIPID PANEL
Cholesterol: 204 mg/dL — ABNORMAL HIGH (ref 0–200)
HDL: 80.7 mg/dL (ref >39.00)
LDL Cholesterol: 114 mg/dL — ABNORMAL HIGH (ref 0–99)
NONHDL: 123.68
TRIGLYCERIDES: 46 mg/dL (ref 0.0–149.0)
Total CHOL/HDL Ratio: 3
VLDL: 9.2 mg/dL (ref 0.0–40.0)

## 2016-07-16 LAB — HEMOGLOBIN A1C: HEMOGLOBIN A1C: 6.5 % (ref 4.6–6.5)

## 2016-07-16 LAB — VITAMIN B12: Vitamin B-12: 357 pg/mL (ref 211–911)

## 2016-07-16 LAB — VITAMIN D 25 HYDROXY (VIT D DEFICIENCY, FRACTURES): VITD: 37.2 ng/mL (ref 30.00–100.00)

## 2016-07-16 NOTE — Telephone Encounter (Signed)
Pt was in to have labs today. She thought that Dr. Derrel Nip would be checking her TSH and T4. Pt states this was not done today. Please advise.

## 2016-07-17 ENCOUNTER — Ambulatory Visit: Admit: 2016-07-17 | Payer: Medicare Other | Admitting: Gastroenterology

## 2016-07-17 ENCOUNTER — Encounter: Payer: Self-pay | Admitting: Internal Medicine

## 2016-07-17 LAB — T4 AND TSH
T4 TOTAL: 5.3 ug/dL (ref 4.5–12.0)
TSH: 18.96 u[IU]/mL — AB (ref 0.450–4.500)

## 2016-07-17 SURGERY — ESOPHAGOGASTRODUODENOSCOPY (EGD) WITH PROPOFOL
Anesthesia: General

## 2016-07-17 MED ORDER — LEVOTHYROXINE SODIUM 100 MCG PO TABS: 100.0000 ug | ORAL_TABLET | Freq: Every day | ORAL | 0 refills | Status: DC

## 2016-07-17 NOTE — Telephone Encounter (Signed)
TSH and T4 were done 07/16/16. Please advise on results.

## 2016-07-19 NOTE — Progress Notes (Signed)
Lab visit only, no charge

## 2016-07-21 DIAGNOSIS — D0472 Carcinoma in situ of skin of left lower limb, including hip: Secondary | ICD-10-CM | POA: Diagnosis not present

## 2016-07-21 DIAGNOSIS — C4492 Squamous cell carcinoma of skin, unspecified: Secondary | ICD-10-CM | POA: Insufficient documentation

## 2016-07-21 DIAGNOSIS — L570 Actinic keratosis: Secondary | ICD-10-CM | POA: Diagnosis not present

## 2016-07-21 DIAGNOSIS — L281 Prurigo nodularis: Secondary | ICD-10-CM | POA: Diagnosis not present

## 2016-07-21 DIAGNOSIS — D044 Carcinoma in situ of skin of scalp and neck: Secondary | ICD-10-CM | POA: Diagnosis not present

## 2016-07-21 DIAGNOSIS — Z85828 Personal history of other malignant neoplasm of skin: Secondary | ICD-10-CM | POA: Diagnosis not present

## 2016-07-21 DIAGNOSIS — X32XXXA Exposure to sunlight, initial encounter: Secondary | ICD-10-CM | POA: Diagnosis not present

## 2016-07-21 DIAGNOSIS — D485 Neoplasm of uncertain behavior of skin: Secondary | ICD-10-CM | POA: Diagnosis not present

## 2016-07-24 ENCOUNTER — Ambulatory Visit: Payer: Self-pay | Admitting: Gastroenterology

## 2016-07-28 ENCOUNTER — Ambulatory Visit (INDEPENDENT_AMBULATORY_CARE_PROVIDER_SITE_OTHER): Payer: Medicare Other | Admitting: Gastroenterology

## 2016-07-28 ENCOUNTER — Encounter: Payer: Self-pay | Admitting: Gastroenterology

## 2016-07-28 VITALS — BP 124/64 | HR 73 | Temp 98.4°F | Ht 65.5 in | Wt 157.0 lb

## 2016-07-28 DIAGNOSIS — K229 Disease of esophagus, unspecified: Secondary | ICD-10-CM | POA: Diagnosis not present

## 2016-07-28 DIAGNOSIS — K224 Dyskinesia of esophagus: Secondary | ICD-10-CM

## 2016-07-28 NOTE — Progress Notes (Signed)
Primary Care Physician: Crecencio Mc, MD  Primary Gastroenterologist:  Dr. Lucilla Lame  Chief Complaint  Patient presents with  . Follow up manometry results    HPI: Megan Bradley is a 76 y.o. female here for follow-up after having esophageal manometry.  The patient was found to have [facility] esophagus on the manometry. The patient reports that she has been feeling well recently without chest pain or dysphagia.  She also states that she runs a low blood pressure usually.  The patient stopped the Detrol because she thought that was making her feel worse.  Current Outpatient Prescriptions  Medication Sig Dispense Refill  . ALPRAZolam (XANAX) 0.5 MG tablet Take 1 tablet (0.5 mg total) by mouth at bedtime as needed for anxiety. 30 tablet 5  . Cholecalciferol (VITAMIN D) 2000 UNITS CAPS Take 1 capsule by mouth daily.      Marland Kitchen ezetimibe (ZETIA) 10 MG tablet Take 1 tablet by mouth  daily 90 tablet 2  . glucose blood test strip For One touch Verio Flex .  Use to check 3 times daily   e11.9 100 each 0  . levothyroxine (SYNTHROID, LEVOTHROID) 100 MCG tablet Take 1 tablet (100 mcg total) by mouth daily before breakfast. 30 tablet 0  . meclizine (ANTIVERT) 25 MG tablet TAKE ONE TABLET BY MOUTH THREE TIMES DAILY AS NEEDED FOR VERTIGO 30 tablet 1  . nitroGLYCERIN (NITROSTAT) 0.4 MG SL tablet Place 1 tablet (0.4 mg total) under the tongue every 5 (five) minutes as needed for chest pain. 50 tablet 3  . omeprazole (PRILOSEC) 40 MG capsule Take 1 capsule by mouth  daily 90 capsule 3  . sertraline (ZOLOFT) 100 MG tablet Take 1.5 tablets (150 mg total) by mouth daily. 145 tablet 2   Current Facility-Administered Medications  Medication Dose Route Frequency Provider Last Rate Last Dose  . lidocaine (XYLOCAINE) 1 % (with pres) injection 4 mL  4 mL Other Once Crecencio Mc, MD      . triamcinolone acetonide (KENALOG-40) injection 20 mg  20 mg Intra-articular Once Crecencio Mc, MD      .  triamcinolone acetonide (KENALOG-40) injection 20 mg  20 mg Intramuscular Once Crecencio Mc, MD        Allergies as of 07/28/2016 - Review Complete 07/28/2016  Allergen Reaction Noted  . Atorvastatin Other (See Comments) 06/14/2013  . Codeine  09/12/2010  . Fentanyl Nausea Only 09/12/2010  . Lipitor [atorvastatin calcium]  09/12/2010  . Tetracyclines & related Hives 09/12/2010    ROS:  General: Negative for anorexia, weight loss, fever, chills, fatigue, weakness. ENT: Negative for hoarseness, difficulty swallowing , nasal congestion. CV: Negative for chest pain, angina, palpitations, dyspnea on exertion, peripheral edema.  Respiratory: Negative for dyspnea at rest, dyspnea on exertion, cough, sputum, wheezing.  GI: See history of present illness. GU:  Negative for dysuria, hematuria, urinary incontinence, urinary frequency, nocturnal urination.  Endo: Negative for unusual weight change.    Physical Examination:   BP 124/64   Pulse 73   Temp 98.4 F (36.9 C) (Oral)   Ht 5' 5.5" (1.664 m)   Wt 157 lb (71.2 kg)   BMI 25.73 kg/m   General: Well-nourished, well-developed in no acute distress.  Eyes: No icterus. Conjunctivae pink. Mouth: Oropharyngeal mucosa moist and pink , no lesions erythema or exudate. Lungs: Clear to auscultation bilaterally. Non-labored. Heart: Regular rate and rhythm, no murmurs rubs or gallops.  Abdomen: Bowel sounds are normal, nontender, nondistended, no  hepatosplenomegaly or masses, no abdominal bruits or hernia , no rebound or guarding.   Extremities: No lower extremity edema. No clubbing or deformities. Neuro: Alert and oriented x 3.  Grossly intact. Skin: Warm and dry, no jaundice.   Psych: Alert and cooperative, normal mood and affect.  Labs:    Imaging Studies: No results found.  Assessment and Plan:   Megan Bradley is a 76 y.o. y/o female who had esophageal manometry with the findings of Megan Bradley esophagus.  The patient states she is  doing well right now and would not like to start any medications.  The patient has been told to contact me if her symptoms get worse.  The patient has been explained the plan and agrees with it.    Lucilla Lame, MD. [name]   Note: This dictation was prepared with Dragon dictation along with smaller phrase technology. Any transcriptional errors that result from this process are unintentional.

## 2016-07-29 ENCOUNTER — Other Ambulatory Visit: Payer: Self-pay

## 2016-08-18 ENCOUNTER — Other Ambulatory Visit: Payer: Self-pay | Admitting: Internal Medicine

## 2016-08-19 ENCOUNTER — Encounter: Payer: Self-pay | Admitting: Gastroenterology

## 2016-08-19 ENCOUNTER — Other Ambulatory Visit: Payer: Self-pay

## 2016-08-19 MED ORDER — DILTIAZEM HCL 30 MG PO TABS: 30.0000 mg | ORAL_TABLET | Freq: Four times a day (QID) | ORAL | 0 refills | Status: DC

## 2016-08-25 DIAGNOSIS — D0472 Carcinoma in situ of skin of left lower limb, including hip: Secondary | ICD-10-CM | POA: Diagnosis not present

## 2016-09-02 ENCOUNTER — Other Ambulatory Visit: Payer: Self-pay | Admitting: Internal Medicine

## 2016-09-03 DIAGNOSIS — C4442 Squamous cell carcinoma of skin of scalp and neck: Secondary | ICD-10-CM | POA: Diagnosis not present

## 2016-09-14 ENCOUNTER — Encounter: Payer: Self-pay | Admitting: Internal Medicine

## 2016-09-14 ENCOUNTER — Other Ambulatory Visit: Payer: Self-pay | Admitting: Internal Medicine

## 2016-09-14 DIAGNOSIS — E032 Hypothyroidism due to medicaments and other exogenous substances: Secondary | ICD-10-CM

## 2016-09-14 DIAGNOSIS — E7849 Other hyperlipidemia: Secondary | ICD-10-CM

## 2016-09-14 DIAGNOSIS — L659 Nonscarring hair loss, unspecified: Secondary | ICD-10-CM

## 2016-09-17 ENCOUNTER — Telehealth: Payer: Self-pay | Admitting: Internal Medicine

## 2016-09-18 ENCOUNTER — Telehealth: Payer: Self-pay

## 2016-09-18 ENCOUNTER — Other Ambulatory Visit (INDEPENDENT_AMBULATORY_CARE_PROVIDER_SITE_OTHER): Payer: Medicare Other

## 2016-09-18 DIAGNOSIS — E7849 Other hyperlipidemia: Secondary | ICD-10-CM

## 2016-09-18 DIAGNOSIS — L659 Nonscarring hair loss, unspecified: Secondary | ICD-10-CM

## 2016-09-18 NOTE — Telephone Encounter (Signed)
Message sent.  please set up lab appt for patient  No fasting required

## 2016-09-18 NOTE — Telephone Encounter (Signed)
LMTCB. Need to schedule pt a non fasting lab appt.

## 2016-09-18 NOTE — Telephone Encounter (Signed)
Scheduled for   04/12

## 2016-09-19 LAB — COMPREHENSIVE METABOLIC PANEL
ALK PHOS: 53 U/L (ref 39–117)
ALT: 15 U/L (ref 0–35)
AST: 16 U/L (ref 0–37)
Albumin: 4 g/dL (ref 3.5–5.2)
BILIRUBIN TOTAL: 0.4 mg/dL (ref 0.2–1.2)
BUN: 24 mg/dL — AB (ref 6–23)
CO2: 28 meq/L (ref 19–32)
CREATININE: 1.03 mg/dL (ref 0.40–1.20)
Calcium: 9.3 mg/dL (ref 8.4–10.5)
Chloride: 107 mEq/L (ref 96–112)
GFR: 55.65 mL/min — AB (ref >60.00)
GLUCOSE: 119 mg/dL — AB (ref 70–99)
Potassium: 4.2 mEq/L (ref 3.5–5.1)
SODIUM: 142 meq/L (ref 135–145)
Total Protein: 6.4 g/dL (ref 6.0–8.3)

## 2016-09-19 LAB — CBC WITH DIFFERENTIAL/PLATELET
BASOS ABS: 0 10*3/uL (ref 0.0–0.1)
Basophils Relative: 0.5 % (ref 0.0–3.0)
EOS ABS: 0.1 10*3/uL (ref 0.0–0.7)
Eosinophils Relative: 1.9 % (ref 0.0–5.0)
HCT: 38.3 % (ref 36.0–46.0)
Hemoglobin: 12.7 g/dL (ref 12.0–15.0)
LYMPHS ABS: 1.7 10*3/uL (ref 0.7–4.0)
Lymphocytes Relative: 22.3 % (ref 12.0–46.0)
MCHC: 33.1 g/dL (ref 30.0–36.0)
MCV: 91.6 fl (ref 78.0–100.0)
MONO ABS: 0.6 10*3/uL (ref 0.1–1.0)
Monocytes Relative: 8 % (ref 3.0–12.0)
NEUTROS ABS: 5.3 10*3/uL (ref 1.4–7.7)
NEUTROS PCT: 67.3 % (ref 43.0–77.0)
PLATELETS: 176 10*3/uL (ref 150.0–400.0)
RBC: 4.18 Mil/uL (ref 3.87–5.11)
RDW: 15.6 % — ABNORMAL HIGH (ref 11.5–15.5)
WBC: 7.8 10*3/uL (ref 4.0–10.5)

## 2016-09-19 LAB — TSH: TSH: 2.7 u[IU]/mL (ref 0.35–4.50)

## 2016-09-19 LAB — T4: T4, Total: 7.9 ug/dL (ref 4.5–12.0)

## 2016-09-22 ENCOUNTER — Encounter: Payer: Self-pay | Admitting: Internal Medicine

## 2016-09-25 DIAGNOSIS — L820 Inflamed seborrheic keratosis: Secondary | ICD-10-CM | POA: Diagnosis not present

## 2016-10-07 ENCOUNTER — Encounter: Payer: Self-pay | Admitting: Gastroenterology

## 2016-10-12 ENCOUNTER — Other Ambulatory Visit: Payer: Self-pay | Admitting: Internal Medicine

## 2016-10-13 ENCOUNTER — Other Ambulatory Visit: Payer: Self-pay | Admitting: Internal Medicine

## 2016-10-13 ENCOUNTER — Encounter: Payer: Self-pay | Admitting: Internal Medicine

## 2016-10-13 MED ORDER — FUROSEMIDE 20 MG PO TABS: 20.0000 mg | ORAL_TABLET | Freq: Every day | ORAL | 3 refills | Status: DC | PRN

## 2016-10-13 NOTE — Progress Notes (Signed)
fro 

## 2016-10-13 NOTE — Telephone Encounter (Signed)
Patient last OV 2/18 request diuretic, please advise , patient stated ankles swelling started after taking diltliazem 30 mg 4 x times daily, Prescribed by Dr. Allen Norris for esophageal spasms.

## 2016-10-21 ENCOUNTER — Other Ambulatory Visit: Payer: Self-pay | Admitting: Internal Medicine

## 2016-11-10 DIAGNOSIS — Z85828 Personal history of other malignant neoplasm of skin: Secondary | ICD-10-CM | POA: Diagnosis not present

## 2016-11-13 ENCOUNTER — Other Ambulatory Visit: Payer: Self-pay | Admitting: Internal Medicine

## 2016-11-13 DIAGNOSIS — N879 Dysplasia of cervix uteri, unspecified: Secondary | ICD-10-CM

## 2016-11-13 DIAGNOSIS — Z124 Encounter for screening for malignant neoplasm of cervix: Secondary | ICD-10-CM

## 2016-11-13 DIAGNOSIS — R87619 Unspecified abnormal cytological findings in specimens from cervix uteri: Secondary | ICD-10-CM

## 2016-11-13 NOTE — Assessment & Plan Note (Signed)
History of LGSIL, last PAP normal/atrophy in 2016 at [facility].  She has requested referral to an alternative provider

## 2016-11-17 ENCOUNTER — Encounter: Payer: Self-pay | Admitting: Internal Medicine

## 2016-11-17 MED ORDER — LEVOTHYROXINE SODIUM 100 MCG PO TABS: ORAL_TABLET | ORAL | 1 refills | Status: DC

## 2016-12-05 ENCOUNTER — Other Ambulatory Visit: Payer: Self-pay | Admitting: Internal Medicine

## 2016-12-11 ENCOUNTER — Encounter: Payer: Self-pay | Admitting: Internal Medicine

## 2016-12-11 LAB — FECAL OCCULT BLOOD, GUAIAC: FECAL OCCULT BLD: NEGATIVE

## 2016-12-15 ENCOUNTER — Other Ambulatory Visit: Payer: Self-pay | Admitting: Internal Medicine

## 2016-12-15 ENCOUNTER — Encounter: Payer: Self-pay | Admitting: Internal Medicine

## 2016-12-15 MED ORDER — DILTIAZEM HCL ER COATED BEADS 120 MG PO CP24: 120.0000 mg | ORAL_CAPSULE | Freq: Every day | ORAL | 0 refills | Status: DC

## 2016-12-25 DIAGNOSIS — L570 Actinic keratosis: Secondary | ICD-10-CM | POA: Diagnosis not present

## 2016-12-25 DIAGNOSIS — X32XXXA Exposure to sunlight, initial encounter: Secondary | ICD-10-CM | POA: Diagnosis not present

## 2016-12-25 DIAGNOSIS — Z85828 Personal history of other malignant neoplasm of skin: Secondary | ICD-10-CM | POA: Diagnosis not present

## 2016-12-31 ENCOUNTER — Encounter: Payer: Self-pay | Admitting: Internal Medicine

## 2017-01-05 ENCOUNTER — Other Ambulatory Visit: Payer: Self-pay | Admitting: Obstetrics and Gynecology

## 2017-01-06 DIAGNOSIS — Z1231 Encounter for screening mammogram for malignant neoplasm of breast: Secondary | ICD-10-CM | POA: Diagnosis not present

## 2017-01-06 DIAGNOSIS — Z01419 Encounter for gynecological examination (general) (routine) without abnormal findings: Secondary | ICD-10-CM | POA: Diagnosis not present

## 2017-01-06 DIAGNOSIS — Z124 Encounter for screening for malignant neoplasm of cervix: Secondary | ICD-10-CM | POA: Diagnosis not present

## 2017-01-06 LAB — HM MAMMOGRAPHY

## 2017-01-09 ENCOUNTER — Encounter: Payer: Self-pay | Admitting: Internal Medicine

## 2017-01-13 ENCOUNTER — Telehealth: Payer: Self-pay | Admitting: Internal Medicine

## 2017-01-13 NOTE — Telephone Encounter (Signed)
She had a negative cologaurd test in October why is she getting robocalls? Please update health maintenance

## 2017-01-13 NOTE — Telephone Encounter (Signed)
Pt called and stated that she received a robo call in regards to having a colonoscopy or home kit done. Pt states that she had it done when the [facility] nurse came to the house for a visit. Pt states that it was collected on 12/11/16 and reported on 12/16/16. Please advise, thank you!

## 2017-01-13 NOTE — Telephone Encounter (Signed)
Please advise 

## 2017-01-14 NOTE — Telephone Encounter (Signed)
Updated health maintenance and notified pt that she should not be getting anymore robocalls about a colonoscopy.

## 2017-01-26 ENCOUNTER — Encounter: Payer: Self-pay | Admitting: Gastroenterology

## 2017-01-30 NOTE — Telephone Encounter (Signed)
error 

## 2017-02-14 ENCOUNTER — Telehealth: Payer: Self-pay | Admitting: Internal Medicine

## 2017-02-18 NOTE — Telephone Encounter (Signed)
YES

## 2017-02-18 NOTE — Telephone Encounter (Signed)
Spoke with pt and let her know that her medication has already been refilled two days ago. Pt stated that she would like to follow up with you because her blood pressure has started running high. Would it be okay to schedule pt for next Tuesday 02/24/2017 at 4:30pm?

## 2017-02-18 NOTE — Telephone Encounter (Signed)
Pt called to follow up with needing a refill. Please advise?  Call pt @ [phone]. Thank you!

## 2017-02-19 ENCOUNTER — Encounter: Payer: Self-pay | Admitting: *Deleted

## 2017-02-19 NOTE — Telephone Encounter (Signed)
Could you please schedule pt for 02/24/2017 @ 4:30 per Dr. Derrel Nip. Need to call pt to remind her of the appt.

## 2017-02-19 NOTE — Telephone Encounter (Signed)
Patient has been scheduled and My charted.

## 2017-02-24 ENCOUNTER — Encounter: Payer: Self-pay | Admitting: Internal Medicine

## 2017-02-24 ENCOUNTER — Ambulatory Visit (INDEPENDENT_AMBULATORY_CARE_PROVIDER_SITE_OTHER): Payer: Medicare Other | Admitting: Internal Medicine

## 2017-02-24 VITALS — BP 114/68 | HR 63 | Temp 98.2°F | Resp 15 | Ht 65.5 in | Wt 154.4 lb

## 2017-02-24 DIAGNOSIS — E538 Deficiency of other specified B group vitamins: Secondary | ICD-10-CM

## 2017-02-24 DIAGNOSIS — E032 Hypothyroidism due to medicaments and other exogenous substances: Secondary | ICD-10-CM | POA: Diagnosis not present

## 2017-02-24 DIAGNOSIS — R05 Cough: Secondary | ICD-10-CM

## 2017-02-24 DIAGNOSIS — E119 Type 2 diabetes mellitus without complications: Secondary | ICD-10-CM

## 2017-02-24 DIAGNOSIS — E559 Vitamin D deficiency, unspecified: Secondary | ICD-10-CM | POA: Diagnosis not present

## 2017-02-24 DIAGNOSIS — R058 Other specified cough: Secondary | ICD-10-CM

## 2017-02-24 DIAGNOSIS — R059 Cough, unspecified: Secondary | ICD-10-CM

## 2017-02-24 DIAGNOSIS — Z862 Personal history of diseases of the blood and blood-forming organs and certain disorders involving the immune mechanism: Secondary | ICD-10-CM | POA: Diagnosis not present

## 2017-02-24 MED ORDER — BENZONATATE 200 MG PO CAPS: 200.0000 mg | ORAL_CAPSULE | Freq: Three times a day (TID) | ORAL | 0 refills | Status: DC | PRN

## 2017-02-24 NOTE — Assessment & Plan Note (Signed)
Etiology presume due to GERD,  However, she is taking omeprazole 40 mg daily.  Referral to ENT for evaluatiion of vocal cords.

## 2017-02-24 NOTE — Assessment & Plan Note (Signed)
Repeat level needed,

## 2017-02-24 NOTE — Assessment & Plan Note (Signed)
She has been taking an oral supplement. Repeat level needed.

## 2017-02-24 NOTE — Progress Notes (Signed)
Subjective:  Patient ID: Megan Bradley, female    DOB: 26-Oct-1940  Age: 76 y.o. MRN: [id number]  CC: The primary encounter diagnosis was History of pernicious anemia. Diagnoses of Iatrogenic hypothyroidism, Diabetes mellitus without complication ([place]), Vitamin D deficiency, B12 deficiency, Hypovitaminosis D, Recurrent dry cough, and Cough were also pertinent to this visit.  HPI Megan Bradley presents for follow up T2DM,  Iatrogenic  hypothyroidism  Nonproductive Cough causing hoarseness, started with a scratchy throat  For the past two weeks.    Taking omeprazole 40 mg In the morning,  thyroid before breakfast with black coffee.  Losing voice during conversations lasting > 5 minutes.   Hair is thinning.  Subtle loss.  Has been physically and emotionally stressed by her recent downsizing to smaller home and threatened loss of her vacation home on [place] due to [facility] .   Lost 12 lbs since her last visit but gained it back due to eating take out during the prolonged move.   Lab Results  Component Value Date   [id number] 494 07/16/2016     Outpatient Medications Prior to Visit  Medication Sig Dispense Refill  . ALPRAZolam (XANAX) 0.5 MG tablet Take 1 tablet (0.5 mg total) by mouth at bedtime as needed for anxiety. 30 tablet 5  . CARTIA XT 120 MG 24 hr capsule TAKE 1 CAPSULE BY MOUTH ONCE DAILY 30 capsule 0  . Cholecalciferol (VITAMIN D) 2000 UNITS CAPS Take 1 capsule by mouth daily.      Marland Kitchen ezetimibe (ZETIA) 10 MG tablet TAKE 1 TABLET BY MOUTH  DAILY 90 tablet 0  . furosemide (LASIX) 20 MG tablet Take 1 tablet (20 mg total) by mouth daily as needed. For fluid retention 30 tablet 3  . glucose blood test strip For One touch Verio Flex .  Use to check 3 times daily   e11.9 100 each 0  . levothyroxine (SYNTHROID, LEVOTHROID) 100 MCG tablet TAKE 1 TABLET BY MOUTH ONCE DAILY BEFORE BREAKFAST 90 tablet 1  . meclizine (ANTIVERT) 25 MG tablet TAKE ONE TABLET BY MOUTH THREE TIMES  DAILY AS NEEDED FOR  VERTIGO 30 tablet 1  . nitroGLYCERIN (NITROSTAT) 0.4 MG SL tablet Place 1 tablet (0.4 mg total) under the tongue every 5 (five) minutes as needed for chest pain. 50 tablet 3  . omeprazole (PRILOSEC) 40 MG capsule Take 1 capsule by mouth  daily 90 capsule 3  . sertraline (ZOLOFT) 100 MG tablet TAKE 1 AND 1/2 TABLETS BY  MOUTH DAILY 135 tablet 3   Facility-Administered Medications Prior to Visit  Medication Dose Route Frequency Provider Last Rate Last Dose  . lidocaine (XYLOCAINE) 1 % (with pres) injection 4 mL  4 mL Other Once Crecencio Mc, MD      . triamcinolone acetonide (KENALOG-40) injection 20 mg  20 mg Intra-articular Once Crecencio Mc, MD      . triamcinolone acetonide (KENALOG-40) injection 20 mg  20 mg Intramuscular Once Crecencio Mc, MD        Review of Systems;  Patient denies headache, fevers, malaise, unintentional weight loss, skin rash, eye pain, sinus congestion and sinus pain, sore throat, dysphagia,  hemoptysis , cough, dyspnea, wheezing, chest pain, palpitations, orthopnea, edema, abdominal pain, nausea, melena, diarrhea, constipation, flank pain, dysuria, hematuria, urinary  Frequency, nocturia, numbness, tingling, seizures,  Focal weakness, Loss of consciousness,  Tremor, insomnia, depression, anxiety, and suicidal ideation.      Objective:  BP 114/68 (BP Location: Left  Arm, Patient Position: Sitting, Cuff Size: Normal)   Pulse 63   Temp 98.2 F (36.8 C) (Oral)   Resp 15   Ht 5' 5.5" (1.664 m)   Wt 154 lb 6.4 oz (70 kg)   SpO2 94%   BMI 25.30 kg/m   BP Readings from Last 3 Encounters:  02/24/17 114/68  07/28/16 124/64  07/03/16 110/70    Wt Readings from Last 3 Encounters:  02/24/17 154 lb 6.4 oz (70 kg)  07/28/16 157 lb (71.2 kg)  07/03/16 156 lb 12 oz (71.1 kg)    General appearance: alert, cooperative and appears stated age Ears: normal TM's and external ear canals both ears Throat: lips, mucosa, and tongue normal;  teeth and gums normal Neck: no adenopathy, no carotid bruit, supple, symmetrical, trachea midline and thyroid not enlarged, symmetric, no tenderness/mass/nodules Back: symmetric, no curvature. ROM normal. No CVA tenderness. Lungs: clear to auscultation bilaterally Heart: regular rate and rhythm, S1, S2 normal, no murmur, click, rub or gallop Abdomen: soft, non-tender; bowel sounds normal; no masses,  no organomegaly Pulses: 2+ and symmetric Skin: Skin color, texture, turgor normal. No rashes or lesions Lymph nodes: Cervical, supraclavicular, and axillary nodes normal.  Lab Results  Component Value Date   HGBA1C 6.5 07/16/2016   HGBA1C 6.5 01/02/2016   HGBA1C 6.8 (H) 06/07/2014    Lab Results  Component Value Date   CREATININE 1.03 09/18/2016   CREATININE 0.98 07/16/2016   CREATININE 1.03 (H) 02/26/2016    Lab Results  Component Value Date   WBC 7.8 09/18/2016   HGB 12.7 09/18/2016   HCT 38.3 09/18/2016   PLT 176.0 09/18/2016   GLUCOSE 119 (H) 09/18/2016   CHOL 204 (H) 07/16/2016   TRIG 46.0 07/16/2016   HDL 80.70 07/16/2016   LDLDIRECT 93.0 01/02/2016   LDLCALC 114 (H) 07/16/2016   ALT 15 09/18/2016   AST 16 09/18/2016   NA 142 09/18/2016   K 4.2 09/18/2016   CL 107 09/18/2016   CREATININE 1.03 09/18/2016   BUN 24 (H) 09/18/2016   CO2 28 09/18/2016   TSH 2.70 09/18/2016   INR 0.9 12/05/2011   HGBA1C 6.5 07/16/2016   MICROALBUR <0.7 01/02/2016    No results found.  Assessment & Plan:   Problem List Items Addressed This Visit    Diabetes mellitus without complication ([place])    Historically well-controlled on low GI diet.  . Patient has no microalbuminuria. Patient is tolerating statin therapy for CAD risk reduction and on ACE/ARB for renal protection and hypertension . She will return for fasting labs   Lab Results  Component Value Date   HGBA1C 6.5 07/16/2016   Lab Results  Component Value Date   MICROALBUR <0.7 01/02/2016         Relevant Orders    Hemoglobin A1c   Comprehensive metabolic panel   Lipid panel   History of pernicious anemia - Primary    She has been taking an oral supplement. Repeat level needed.       Hypovitaminosis D    Repeat level needed,        Iatrogenic hypothyroidism    She has been noticing thinning of hair without overt loss. Her thyroid level is due for repeat assessment.  She is taking her medication before breakfast but not 2 hours before breakfast.       Relevant Orders   TSH   T4   Recurrent dry cough    Etiology presume due to GERD,  However, she  is taking omeprazole 40 mg daily.  Referral to ENT for evaluatiion of vocal cords.        Other Visit Diagnoses    Vitamin D deficiency       Relevant Orders   VITAMIN D 25 Hydroxy (Vit-D Deficiency, Fractures)   B12 deficiency       Relevant Orders   Vitamin B12   Cough       Relevant Orders   Ambulatory referral to ENT      I am having Ms. Younes start on benzonatate. I am also having her maintain her Vitamin D, glucose blood, nitroGLYCERIN, omeprazole, ALPRAZolam, meclizine, furosemide, sertraline, levothyroxine, ezetimibe, and CARTIA XT. We will continue to administer triamcinolone acetonide, triamcinolone acetonide, and lidocaine.  Meds ordered this encounter  Medications  . benzonatate (TESSALON) 200 MG capsule    Sig: Take 1 capsule (200 mg total) by mouth 3 (three) times daily as needed for cough.    Dispense:  60 capsule    Refill:  0    There are no discontinued medications.  Follow-up: No Follow-up on file.   Crecencio Mc, MD

## 2017-02-24 NOTE — Assessment & Plan Note (Signed)
Historically well-controlled on low GI diet.  . Patient has no microalbuminuria. Patient is tolerating statin therapy for CAD risk reduction and on ACE/ARB for renal protection and hypertension . She will return for fasting labs   Lab Results  Component Value Date   HGBA1C 6.5 07/16/2016   Lab Results  Component Value Date   MICROALBUR <0.7 01/02/2016

## 2017-02-24 NOTE — Assessment & Plan Note (Signed)
She has been noticing thinning of hair without overt loss. Her thyroid level is due for repeat assessment.  She is taking her medication before breakfast but not 2 hours before breakfast.

## 2017-02-27 ENCOUNTER — Other Ambulatory Visit (INDEPENDENT_AMBULATORY_CARE_PROVIDER_SITE_OTHER): Payer: Medicare Other

## 2017-02-27 DIAGNOSIS — E559 Vitamin D deficiency, unspecified: Secondary | ICD-10-CM

## 2017-02-27 DIAGNOSIS — E119 Type 2 diabetes mellitus without complications: Secondary | ICD-10-CM

## 2017-02-27 DIAGNOSIS — E032 Hypothyroidism due to medicaments and other exogenous substances: Secondary | ICD-10-CM | POA: Diagnosis not present

## 2017-02-27 DIAGNOSIS — R1310 Dysphagia, unspecified: Secondary | ICD-10-CM | POA: Diagnosis not present

## 2017-02-27 DIAGNOSIS — E538 Deficiency of other specified B group vitamins: Secondary | ICD-10-CM

## 2017-02-27 DIAGNOSIS — K219 Gastro-esophageal reflux disease without esophagitis: Secondary | ICD-10-CM | POA: Diagnosis not present

## 2017-02-27 DIAGNOSIS — R490 Dysphonia: Secondary | ICD-10-CM | POA: Diagnosis not present

## 2017-02-27 LAB — COMPREHENSIVE METABOLIC PANEL
ALBUMIN: 4 g/dL (ref 3.5–5.2)
ALT: 16 U/L (ref 0–35)
AST: 16 U/L (ref 0–37)
Alkaline Phosphatase: 58 U/L (ref 39–117)
BUN: 17 mg/dL (ref 6–23)
CALCIUM: 9.4 mg/dL (ref 8.4–10.5)
CHLORIDE: 106 meq/L (ref 96–112)
CO2: 32 mEq/L (ref 19–32)
Creatinine, Ser: 1.05 mg/dL (ref 0.40–1.20)
GFR: 54.36 mL/min — ABNORMAL LOW (ref >60.00)
Glucose, Bld: 126 mg/dL — ABNORMAL HIGH (ref 70–99)
POTASSIUM: 4.3 meq/L (ref 3.5–5.1)
Sodium: 144 mEq/L (ref 135–145)
TOTAL PROTEIN: 6.4 g/dL (ref 6.0–8.3)
Total Bilirubin: 0.5 mg/dL (ref 0.2–1.2)

## 2017-02-27 LAB — LIPID PANEL
CHOLESTEROL: 207 mg/dL — AB (ref 0–200)
HDL: 76.1 mg/dL (ref >39.00)
LDL Cholesterol: 120 mg/dL — ABNORMAL HIGH (ref 0–99)
NonHDL: 131.03
TRIGLYCERIDES: 53 mg/dL (ref 0.0–149.0)
Total CHOL/HDL Ratio: 3
VLDL: 10.6 mg/dL (ref 0.0–40.0)

## 2017-02-27 LAB — TSH: TSH: 2.4 u[IU]/mL (ref 0.35–4.50)

## 2017-02-27 LAB — T4: T4 TOTAL: 7.4 ug/dL (ref 5.1–11.9)

## 2017-02-27 LAB — VITAMIN B12: VITAMIN B 12: 422 pg/mL (ref 211–911)

## 2017-02-27 LAB — HEMOGLOBIN A1C: Hgb A1c MFr Bld: 6.7 % — ABNORMAL HIGH (ref 4.6–6.5)

## 2017-02-27 LAB — VITAMIN D 25 HYDROXY (VIT D DEFICIENCY, FRACTURES): VITD: 32.21 ng/mL (ref 30.00–100.00)

## 2017-03-04 NOTE — Telephone Encounter (Signed)
error 

## 2017-03-04 NOTE — Telephone Encounter (Signed)
Error

## 2017-03-16 ENCOUNTER — Other Ambulatory Visit: Payer: Self-pay | Admitting: Internal Medicine

## 2017-03-20 ENCOUNTER — Encounter: Payer: Self-pay | Admitting: Internal Medicine

## 2017-03-20 NOTE — Progress Notes (Unsigned)
Documentation ACE/ARbs therapy for Nephropathy.

## 2017-03-23 ENCOUNTER — Ambulatory Visit (INDEPENDENT_AMBULATORY_CARE_PROVIDER_SITE_OTHER): Payer: Medicare Other

## 2017-03-23 DIAGNOSIS — Z23 Encounter for immunization: Secondary | ICD-10-CM

## 2017-03-24 ENCOUNTER — Emergency Department: Payer: Medicare Other

## 2017-03-24 ENCOUNTER — Encounter: Payer: Self-pay | Admitting: Emergency Medicine

## 2017-03-24 ENCOUNTER — Emergency Department
Admission: EM | Admit: 2017-03-24 | Discharge: 2017-03-24 | Disposition: A | Discharge status: Home / Self Care | Payer: Medicare Other | Attending: Emergency Medicine | Admitting: Emergency Medicine

## 2017-03-24 DIAGNOSIS — R0789 Other chest pain: Secondary | ICD-10-CM

## 2017-03-24 DIAGNOSIS — I10 Essential (primary) hypertension: Secondary | ICD-10-CM | POA: Diagnosis not present

## 2017-03-24 DIAGNOSIS — K224 Dyskinesia of esophagus: Secondary | ICD-10-CM | POA: Diagnosis not present

## 2017-03-24 DIAGNOSIS — R079 Chest pain, unspecified: Secondary | ICD-10-CM | POA: Diagnosis not present

## 2017-03-24 DIAGNOSIS — E119 Type 2 diabetes mellitus without complications: Secondary | ICD-10-CM | POA: Diagnosis not present

## 2017-03-24 DIAGNOSIS — Z79899 Other long term (current) drug therapy: Secondary | ICD-10-CM | POA: Insufficient documentation

## 2017-03-24 LAB — CBC
HCT: 36.2 % (ref 35.0–47.0)
Hemoglobin: 12.2 g/dL (ref 12.0–16.0)
MCH: 30.3 pg (ref 26.0–34.0)
MCHC: 33.6 g/dL (ref 32.0–36.0)
MCV: 90.1 fL (ref 80.0–100.0)
PLATELETS: 156 10*3/uL (ref 150–440)
RBC: 4.02 MIL/uL (ref 3.80–5.20)
RDW: 15.5 % — AB (ref 11.5–14.5)
WBC: 9.1 10*3/uL (ref 3.6–11.0)

## 2017-03-24 LAB — BASIC METABOLIC PANEL
Anion gap: 8 (ref 5–15)
BUN: 19 mg/dL (ref 6–20)
CALCIUM: 9.1 mg/dL (ref 8.9–10.3)
CHLORIDE: 105 mmol/L (ref 101–111)
CO2: 27 mmol/L (ref 22–32)
CREATININE: 0.95 mg/dL (ref 0.44–1.00)
GFR, EST NON AFRICAN AMERICAN: 58 mL/min — AB (ref >60)
Glucose, Bld: 131 mg/dL — ABNORMAL HIGH (ref 65–99)
Potassium: 3.6 mmol/L (ref 3.5–5.1)
SODIUM: 140 mmol/L (ref 135–145)

## 2017-03-24 LAB — TROPONIN I

## 2017-03-24 NOTE — ED Triage Notes (Signed)
Pt to ED via [facility] from home c/o mid chest pain that started today.  Recently moved into new house and was pushing boxes around, states nothing very physical.  States started in ear and moved to chest as pressure but denies any other radiation.  States took 1 nitro at home with some relief but then became dizzy and nauseous with 3 episodes of vomiting.  EMS gaves 324 ASA, 250 bolus normal saline, CBG 128.  Presents A&Ox4, speaking in complete and coherent sentences, chest rise even and unlabored, skin warm and dry, decreased pressure 5/10 at this time.

## 2017-03-24 NOTE — ED Notes (Signed)
Pt refusing xray until seen by MD.  MD and xray notified.

## 2017-03-24 NOTE — ED Provider Notes (Signed)
[facility] Emergency Department Provider Note ____________________________________________   I have reviewed the triage vital signs and the triage nursing note.  HISTORY  Chief Complaint Chest Pain   Historian Patient Friend at the bedside  HPI Megan Bradley is a 76 y.o. female with a history of intermittent chest pain, recent January diagnosis of esophageal spasm, on diltiazem for symptomatic relief, presents to the ED for an episode of chest pain that feels similar to prior esophageal spasm to her.  She does follow yearly with Dr.Gollan, cardiology due to family history, her father died at 48 years old of an MI, however she has no known coronary artery disease herself or other risk factors.  Patient had ran out of her diltiazem prescription 3 days ago and then received her prescription this morning and took one tablet and then just prior to arrival she experienced a soreness and then jaw soreness and then central chest pain associated with nausea and sweating which is similar to prior episodes of esophageal spasm. She took 2 nitroglycerin by the time she came to the emergency department it was relieved, she does have some mild soreness left upper.  No trouble breathing or pleuritic pain.    Past Medical History:  Diagnosis Date  . Anxiety   . Anxiety disorder   . Cervical dysplasia    a. 2010 - high grade squamous intraepithelial lesion s/p excision.  . Chest pain    a. 2006 or 2007 [facility]): reportedly nl cors;  b. 09/2011 E[facility]: nl.  . GERD (gastroesophageal reflux disease)   . Herpes zoster   . Hypothyroidism   . Orthostatic hypotension   . Pre-diabetes   . RBBB   . Vertigo     Patient Active Problem List   Diagnosis Date Noted  . Other dysphagia 04/08/2016  . Recurrent dry cough 04/08/2016  . Hot flushes, perimenopausal 01/05/2016  . Subacromial bursitis 09/27/2015  . Right shoulder pain 09/25/2015  . RBBB   . Chest pain   .  Orthostatic hypotension   . Nonspecific abnormal electrocardiogram (ECG) (EKG) 07/01/2015  . Shoulder pain, left 04/10/2015  . OSA (obstructive sleep apnea) 11/22/2013  . Fracture of multiple ribs 05/30/2013  . Gait disturbance 05/30/2013  . History of pernicious anemia 01/11/2013  . Hypovitaminosis D 01/11/2013  . Deviated septum 01/11/2013  . Hyperlipidemia 09/04/2011  . Abnormal cells of cervix 08/27/2011  . Multinodular goiter 08/27/2011  . Iron deficiency anemia 08/27/2011  . Type 2 diabetes mellitus ([place]) 08/27/2011  . Screening for breast cancer 03/13/2011  . Iatrogenic hypothyroidism   . Diabetes mellitus without complication ([place])   . GERD (gastroesophageal reflux disease)   . Anxiety with obsessional features     Past Surgical History:  Procedure Laterality Date  . CARDIAC CATHETERIZATION  2003   Dr.Callwood, R/L heart cath,  . ESOPHAGEAL MANOMETRY N/A 06/19/2016   Procedure: ESOPHAGEAL MANOMETRY (EM);  Surgeon: Lucilla Lame, MD;  Location: [facility] ENDOSCOPY;  Service: Endoscopy;  Laterality: N/A;  . THYROIDECTOMY  05/15/15   [facility]    Prior to Admission medications   Medication Sig Start Date End Date Taking? Authorizing Provider  ALPRAZolam Duanne Moron) 0.5 MG tablet Take 1 tablet (0.5 mg total) by mouth at bedtime as needed for anxiety. 05/08/16   Crecencio Mc, MD  benzonatate (TESSALON) 200 MG capsule Take 1 capsule (200 mg total) by mouth 3 (three) times daily as needed for cough. 02/24/17   Crecencio Mc, MD  CARTIA XT 120  MG 24 hr capsule TAKE 1 CAPSULE BY MOUTH ONCE DAILY 03/17/17   Crecencio Mc, MD  Cholecalciferol (VITAMIN D) 2000 UNITS CAPS Take 1 capsule by mouth daily.      [provider]  ezetimibe (ZETIA) 10 MG tablet TAKE 1 TABLET BY MOUTH  DAILY 12/05/16   Crecencio Mc, MD  furosemide (LASIX) 20 MG tablet Take 1 tablet (20 mg total) by mouth daily as needed. For fluid retention 10/13/16   Crecencio Mc, MD  glucose blood test strip For One touch  Verio Flex .  Use to check 3 times daily   e11.9 06/29/15   Crecencio Mc, MD  levothyroxine (SYNTHROID, LEVOTHROID) 100 MCG tablet TAKE 1 TABLET BY MOUTH ONCE DAILY BEFORE BREAKFAST 11/17/16   Crecencio Mc, MD  meclizine (ANTIVERT) 25 MG tablet TAKE ONE TABLET BY MOUTH THREE TIMES DAILY AS NEEDED FOR  VERTIGO 09/03/16   Crecencio Mc, MD  nitroGLYCERIN (NITROSTAT) 0.4 MG SL tablet Place 1 tablet (0.4 mg total) under the tongue every 5 (five) minutes as needed for chest pain. 07/12/15   Crecencio Mc, MD  omeprazole (PRILOSEC) 40 MG capsule Take 1 capsule by mouth  daily 02/28/16   Crecencio Mc, MD  sertraline (ZOLOFT) 100 MG tablet TAKE 1 AND 1/2 TABLETS BY  MOUTH DAILY 10/21/16   Crecencio Mc, MD    Allergies  Allergen Reactions  . Atorvastatin Other (See Comments)    Other Reaction: OTHER REACTION  . Codeine     GI problems  . Fentanyl Nausea Only  . Lipitor [Atorvastatin Calcium]     Muscle cramps and weakness  . Statins     Other reaction(s): UNKNOWN  . Tetracyclines & Related Hives    Family History  Problem Relation Age of Onset  . Heart attack Mother   . Alcohol abuse Mother   . Mental illness Mother        died in her 62's of alzheimer's Dementia  . Heart disease Mother 61  . Heart attack Father   . Heart disease Father 69       AMI - died @ 50.  . Colon cancer Paternal Grandmother 54    Social History Social History  Substance Use Topics  . Smoking status: Never Smoker  . Smokeless tobacco: Never Used  . Alcohol use Yes     Comment: wine once a week    Review of Systems  Constitutional: Negative for fever. Eyes: Negative for visual changes. ENT: Negative for sore throat. Cardiovascular: positive for chest pain. Respiratory: Negative for shortness of breath. Gastrointestinal: Negative for abdominal pain, vomiting and diarrhea. Genitourinary: Negative for dysuria. Musculoskeletal: Negative for back pain. Skin: Negative for rash. Neurological:  Negative for headache.  ____________________________________________   PHYSICAL EXAM:  VITAL SIGNS: ED Triage Vitals  Enc Vitals Group     BP 03/24/17 1329 131/69     Pulse Rate 03/24/17 1329 (!) 58     Resp 03/24/17 1329 (!) 25     Temp 03/24/17 1329 97.7 F (36.5 C)     Temp Source 03/24/17 1329 Oral     SpO2 03/24/17 1329 98 %     Weight 03/24/17 1330 148 lb (67.1 kg)     Height 03/24/17 1330 5\' 5"  (1.651 m)     Head Circumference --      Peak Flow --      Pain Score 03/24/17 1329 5     Pain Loc --  Pain Edu? --      Excl. in [place]? --      Constitutional: Alert and oriented. Well appearing and in no distress. HEENT   Head: Normocephalic and atraumatic.      Eyes: Conjunctivae are normal. Pupils equal and round.       Ears:         Nose: No congestion/rhinnorhea.   Mouth/Throat: Mucous membranes are moist.   Neck: No stridor. Cardiovascular/Chest: Normal rate, regular rhythm.  No murmurs, rubs, or gallops. Respiratory: Normal respiratory effort without tachypnea nor retractions. Breath sounds are clear and equal bilaterally. No wheezes/rales/rhonchi. Gastrointestinal: Soft. No distention, no guarding, no rebound. Nontender.    Genitourinary/rectal:Deferred Musculoskeletal: Nontender with normal range of motion in all extremities. No joint effusions.  No lower extremity tenderness.  No edema. Neurologic:  Normal speech and language. No gross or focal neurologic deficits are appreciated. Skin:  Skin is warm, dry and intact. No rash noted. Psychiatric: Mood and affect are normal. Speech and behavior are normal. Patient exhibits appropriate insight and judgment.   ____________________________________________  LABS (pertinent positives/negatives) I, Lisa Roca, MD the attending physician have reviewed the labs noted below.  Labs Reviewed  BASIC METABOLIC PANEL - Abnormal; Notable for the following:       Result Value   Glucose, Bld 131 (*)    GFR calc  non Af Amer 58 (*)    All other components within normal limits  CBC - Abnormal; Notable for the following:    RDW 15.5 (*)    All other components within normal limits  TROPONIN I  TROPONIN I  TROPONIN I    ____________________________________________    EKG I, Lisa Roca, MD, the attending physician have personally viewed and interpreted all ECGs.  55 bpm. Right bundle branch block. Nonspecific ST and T-wave ____________________________________________  RADIOLOGY All Xrays were viewed by me.  Imaging interpreted by Radiologist, and I, Lisa Roca, MD the attending physician have reviewed the radiologist interpretation noted below.  chest x-ray PA and lateral:  IMPRESSION: Stable left basilar scarring. __________________________________________  PROCEDURES  Procedure(s) performed: None  Critical Care performed: None  ____________________________________________  Current Facility-Administered Medications on File Prior to Encounter  Medication Dose Route Frequency Provider Last Rate Last Dose  . lidocaine (XYLOCAINE) 1 % (with pres) injection 4 mL  4 mL Other Once Crecencio Mc, MD      . triamcinolone acetonide (KENALOG-40) injection 20 mg  20 mg Intra-articular Once Crecencio Mc, MD      . triamcinolone acetonide (KENALOG-40) injection 20 mg  20 mg Intramuscular Once Crecencio Mc, MD       Current Outpatient Prescriptions on File Prior to Encounter  Medication Sig Dispense Refill  . ALPRAZolam (XANAX) 0.5 MG tablet Take 1 tablet (0.5 mg total) by mouth at bedtime as needed for anxiety. 30 tablet 5  . benzonatate (TESSALON) 200 MG capsule Take 1 capsule (200 mg total) by mouth 3 (three) times daily as needed for cough. 60 capsule 0  . CARTIA XT 120 MG 24 hr capsule TAKE 1 CAPSULE BY MOUTH ONCE DAILY 90 capsule 1  . Cholecalciferol (VITAMIN D) 2000 UNITS CAPS Take 1 capsule by mouth daily.      Marland Kitchen ezetimibe (ZETIA) 10 MG tablet TAKE 1 TABLET BY MOUTH  DAILY 90  tablet 0  . furosemide (LASIX) 20 MG tablet Take 1 tablet (20 mg total) by mouth daily as needed. For fluid retention 30 tablet 3  . glucose  blood test strip For One touch Verio Flex .  Use to check 3 times daily   e11.9 100 each 0  . levothyroxine (SYNTHROID, LEVOTHROID) 100 MCG tablet TAKE 1 TABLET BY MOUTH ONCE DAILY BEFORE BREAKFAST 90 tablet 1  . meclizine (ANTIVERT) 25 MG tablet TAKE ONE TABLET BY MOUTH THREE TIMES DAILY AS NEEDED FOR  VERTIGO 30 tablet 1  . nitroGLYCERIN (NITROSTAT) 0.4 MG SL tablet Place 1 tablet (0.4 mg total) under the tongue every 5 (five) minutes as needed for chest pain. 50 tablet 3  . omeprazole (PRILOSEC) 40 MG capsule Take 1 capsule by mouth  daily 90 capsule 3  . sertraline (ZOLOFT) 100 MG tablet TAKE 1 AND 1/2 TABLETS BY  MOUTH DAILY 135 tablet 3    ____________________________________________  ED COURSE / ASSESSMENT AND PLAN  Pertinent labs & imaging results that were available during my care of the patient were reviewed by me and considered in my medical decision making (see chart for details).    Symptoms are similar to her prior episodes of esophageal spasm, I do think that most likely what happened today especially given off of her symptomatic medication for the last 3 days. She has a medication now. I am going to recheck her troponin several hours although I feel like this is less likely cause of her acute symptoms which are now relieved here.  DIFFERENTIAL DIAGNOSIS: Differential diagnosis includes, but is not limited to, ACS, aortic dissection, pulmonary embolism, cardiac tamponade, pneumothorax, pneumonia, pericarditis/myocarditis, GI-related causes including esophagitis/gastritis, and musculoskeletal chest wall pain.    CONSULTATIONS:  None   Patient / Family / Caregiver informed of clinical course, medical decision-making process, and agree with plan.   I discussed return precautions, follow-up instructions, and discharge instructions with  patient and/or family.  Discharge Instructions : You have only for chest discomfort and your exam and evaluation are overall reassuring, as we discussed and most suspicious of an episode of esophageal spasm.  Return to the emergency room immediately for any worsening or uncontrolled pain, or any new or worsening chest pain, trouble breathing, fever, lack or bloody stool, vomiting blood, or any other symptoms concerning to you.  ___________________________________________   FINAL CLINICAL IMPRESSION(S) / ED DIAGNOSES   Final diagnoses:  Esophageal spasm  Atypical chest pain              Note: This dictation was prepared with Dragon dictation. Any transcriptional errors that result from this process are unintentional    Lisa Roca, MD 03/24/17 Vernelle Emerald

## 2017-03-24 NOTE — Discharge Instructions (Signed)
You have only for chest discomfort and your exam and evaluation are overall reassuring, as we discussed and most suspicious of an episode of esophageal spasm.  Return to the emergency room immediately for any worsening or uncontrolled pain, or any new or worsening chest pain, trouble breathing, fever, lack or bloody stool, vomiting blood, or any other symptoms concerning to you.

## 2017-03-27 ENCOUNTER — Other Ambulatory Visit: Payer: Self-pay | Admitting: Internal Medicine

## 2017-03-27 ENCOUNTER — Encounter: Payer: Self-pay | Admitting: Internal Medicine

## 2017-03-27 DIAGNOSIS — K224 Dyskinesia of esophagus: Secondary | ICD-10-CM

## 2017-04-01 ENCOUNTER — Encounter: Payer: Self-pay | Admitting: Gastroenterology

## 2017-04-01 ENCOUNTER — Ambulatory Visit (INDEPENDENT_AMBULATORY_CARE_PROVIDER_SITE_OTHER): Payer: Medicare Other | Admitting: Gastroenterology

## 2017-04-01 ENCOUNTER — Other Ambulatory Visit: Payer: Self-pay

## 2017-04-01 ENCOUNTER — Encounter: Payer: Self-pay | Admitting: *Deleted

## 2017-04-01 VITALS — BP 129/70 | HR 60 | Ht 65.5 in | Wt 152.5 lb

## 2017-04-01 DIAGNOSIS — K224 Dyskinesia of esophagus: Secondary | ICD-10-CM

## 2017-04-01 DIAGNOSIS — R0789 Other chest pain: Secondary | ICD-10-CM

## 2017-04-01 DIAGNOSIS — K229 Disease of esophagus, unspecified: Principal | ICD-10-CM

## 2017-04-01 NOTE — Progress Notes (Signed)
Primary Care Physician: Crecencio Mc, MD  Primary Gastroenterologist:  Dr. Lucilla Lame  Chief Complaint  Patient presents with  . Jackhammer esophagus  . Dysphagia    HPI: Megan Bradley is a 76 y.o. female here for recent trip to the ER with chest pain. The patient had a hiatus of taking her diltiazem because she ran out of medication. Naproxen 4 days later she had severe chest pain and went to the ER. The patient was ruled out for a right cardia show started back on her medication and states she is doing well. The patient does have a sore throat and was seen by ENT who said there was nothing wrong with her throat. The patient is on omeprazole 40 mg twice a day.  Current Outpatient Prescriptions  Medication Sig Dispense Refill  . ALPRAZolam (XANAX) 0.5 MG tablet Take 1 tablet (0.5 mg total) by mouth at bedtime as needed for anxiety. 30 tablet 5  . CARTIA XT 120 MG 24 hr capsule TAKE 1 CAPSULE BY MOUTH ONCE DAILY 90 capsule 1  . Cholecalciferol (VITAMIN D) 2000 UNITS CAPS Take 1 capsule by mouth daily.      Marland Kitchen ezetimibe (ZETIA) 10 MG tablet TAKE 1 TABLET BY MOUTH  DAILY 90 tablet 0  . furosemide (LASIX) 20 MG tablet Take 1 tablet (20 mg total) by mouth daily as needed. For fluid retention 30 tablet 3  . glucose blood test strip For One touch Verio Flex .  Use to check 3 times daily   e11.9 100 each 0  . levothyroxine (SYNTHROID, LEVOTHROID) 100 MCG tablet TAKE 1 TABLET BY MOUTH ONCE DAILY BEFORE BREAKFAST 90 tablet 1  . meclizine (ANTIVERT) 25 MG tablet TAKE ONE TABLET BY MOUTH THREE TIMES DAILY AS NEEDED FOR  VERTIGO 30 tablet 1  . nitroGLYCERIN (NITROSTAT) 0.4 MG SL tablet Place 1 tablet (0.4 mg total) under the tongue every 5 (five) minutes as needed for chest pain. 50 tablet 3  . omeprazole (PRILOSEC) 40 MG capsule Take 1 capsule by mouth  daily 90 capsule 3  . sertraline (ZOLOFT) 100 MG tablet TAKE 1 AND 1/2 TABLETS BY  MOUTH DAILY 135 tablet 3   Current Facility-Administered  Medications  Medication Dose Route Frequency Provider Last Rate Last Dose  . lidocaine (XYLOCAINE) 1 % (with pres) injection 4 mL  4 mL Other Once Crecencio Mc, MD      . triamcinolone acetonide (KENALOG-40) injection 20 mg  20 mg Intra-articular Once Crecencio Mc, MD      . triamcinolone acetonide (KENALOG-40) injection 20 mg  20 mg Intramuscular Once Crecencio Mc, MD        Allergies as of 04/01/2017 - Review Complete 04/01/2017  Allergen Reaction Noted  . Atorvastatin Other (See Comments) 06/14/2013  . Codeine  09/12/2010  . Fentanyl Nausea Only 09/12/2010  . Lipitor [atorvastatin calcium]  09/12/2010  . Statins    . Tetracyclines & related Hives 09/12/2010    ROS:  General: Negative for anorexia, weight loss, fever, chills, fatigue, weakness. ENT: Negative for hoarseness, difficulty swallowing , nasal congestion. CV: Negative for chest pain, angina, palpitations, dyspnea on exertion, peripheral edema.  Respiratory: Negative for dyspnea at rest, dyspnea on exertion, cough, sputum, wheezing.  GI: See history of present illness. GU:  Negative for dysuria, hematuria, urinary incontinence, urinary frequency, nocturnal urination.  Endo: Negative for unusual weight change.    Physical Examination:   BP 129/70   Pulse 60  Ht 5' 5.5" (1.664 m)   Wt 152 lb 8 oz (69.2 kg)   BMI 24.99 kg/m   General: Well-nourished, well-developed in no acute distress.  Eyes: No icterus. Conjunctivae pink. Mouth: Oropharyngeal mucosa moist and pink , no lesions erythema or exudate. Lungs: Clear to auscultation bilaterally. Non-labored. Heart: Regular rate and rhythm, no murmurs rubs or gallops.  Abdomen: Bowel sounds are normal, nontender, nondistended, no hepatosplenomegaly or masses, no abdominal bruits or hernia , no rebound or guarding.   Extremities: No lower extremity edema. No clubbing or deformities. Neuro: Alert and oriented x 3.  Grossly intact. Skin: Warm and dry, no jaundice.    Psych: Alert and cooperative, normal mood and affect.  Labs:    Imaging Studies: Dg Chest 2 View  Result Date: 03/24/2017 CLINICAL DATA:  Chest pain EXAM: CHEST  2 VIEW COMPARISON:  02/26/2016 FINDINGS: Cardiac shadow is stable. Bilateral breast implants are noted. The lungs are well aerated bilaterally. Minimal scarring in the left base is seen. No acute bony abnormality is noted. IMPRESSION: Stable left basilar scarring. Electronically Signed   By: Inez Catalina M.D.   On: 03/24/2017 15:22    Assessment and Plan:   Megan Bradley is a 76 y.o. y/o female who has a history of Mayo Ao esophagus and was admitted to the ER with atypical chest pain. The patient had been off of her calcium channel blocker prior to having the chest pain. The patient has been on the medication again and feeling better. The patient will continue her diltiazem and will be set up for an EGD since she is concerned about possible strictures or why she had this attack of her chest pain. The patient has also has been given samples of Dexilant to try to see if that helps with her voice. The patient has been explained the plan and agrees with it    Lucilla Lame, MD. [name]   Note: This dictation was prepared with Dragon dictation along with smaller phrase technology. Any transcriptional errors that result from this process are unintentional.

## 2017-04-02 NOTE — Discharge Instructions (Signed)
General Anesthesia, Adult, Care After °These instructions provide you with information about caring for yourself after your procedure. Your health care provider may also give you more specific instructions. Your treatment has been planned according to current medical practices, but problems sometimes occur. Call your health care provider if you have any problems or questions after your procedure. °What can I expect after the procedure? °After the procedure, it is common to have: °· Vomiting. °· A sore throat. °· Mental slowness. ° °It is common to feel: °· Nauseous. °· Cold or shivery. °· Sleepy. °· Tired. °· Sore or achy, even in parts of your body where you did not have surgery. ° °Follow these instructions at home: °For at least 24 hours after the procedure: °· Do not: °? Participate in activities where you could fall or become injured. °? Drive. °? Use heavy machinery. °? Drink alcohol. °? Take sleeping pills or medicines that cause drowsiness. °? Make important decisions or sign legal documents. °? Take care of children on your own. °· Rest. °Eating and drinking °· If you vomit, drink water, juice, or soup when you can drink without vomiting. °· Drink enough fluid to keep your urine clear or pale yellow. °· Make sure you have little or no nausea before eating solid foods. °· Follow the diet recommended by your health care provider. °General instructions °· Have a responsible adult stay with you until you are awake and alert. °· Return to your normal activities as told by your health care provider. Ask your health care provider what activities are safe for you. °· Take over-the-counter and prescription medicines only as told by your health care provider. °· If you smoke, do not smoke without supervision. °· Keep all follow-up visits as told by your health care provider. This is important. °Contact a health care provider if: °· You continue to have nausea or vomiting at home, and medicines are not helpful. °· You  cannot drink fluids or start eating again. °· You cannot urinate after 8-12 hours. °· You develop a skin rash. °· You have fever. °· You have increasing redness at the site of your procedure. °Get help right away if: °· You have difficulty breathing. °· You have chest pain. °· You have unexpected bleeding. °· You feel that you are having a life-threatening or urgent problem. °This information is not intended to replace advice given to you by your health care provider. Make sure you discuss any questions you have with your health care provider. °Document Released: 09/01/2000 Document Revised: 10/29/2015 Document Reviewed: 05/10/2015 °Elsevier Interactive Patient Education © 2018 [employer] ° °

## 2017-04-03 ENCOUNTER — Ambulatory Visit: Payer: Medicare Other | Admitting: Anesthesiology

## 2017-04-03 ENCOUNTER — Ambulatory Visit
Admission: RE | Admit: 2017-04-03 | Discharge: 2017-04-03 | Disposition: A | Discharge status: Home / Self Care | Payer: Medicare Other | Source: Ambulatory Visit | Attending: Gastroenterology | Admitting: Gastroenterology

## 2017-04-03 ENCOUNTER — Encounter
Admission: RE | Disposition: A | Discharge status: Home / Self Care | Payer: Self-pay | Source: Ambulatory Visit | Attending: Gastroenterology

## 2017-04-03 DIAGNOSIS — R7303 Prediabetes: Secondary | ICD-10-CM | POA: Diagnosis not present

## 2017-04-03 DIAGNOSIS — E039 Hypothyroidism, unspecified: Secondary | ICD-10-CM | POA: Insufficient documentation

## 2017-04-03 DIAGNOSIS — K224 Dyskinesia of esophagus: Secondary | ICD-10-CM

## 2017-04-03 DIAGNOSIS — K229 Disease of esophagus, unspecified: Secondary | ICD-10-CM

## 2017-04-03 DIAGNOSIS — K219 Gastro-esophageal reflux disease without esophagitis: Secondary | ICD-10-CM | POA: Diagnosis not present

## 2017-04-03 DIAGNOSIS — F419 Anxiety disorder, unspecified: Secondary | ICD-10-CM | POA: Diagnosis not present

## 2017-04-03 DIAGNOSIS — I4510 Unspecified right bundle-branch block: Secondary | ICD-10-CM | POA: Diagnosis not present

## 2017-04-03 DIAGNOSIS — K317 Polyp of stomach and duodenum: Secondary | ICD-10-CM | POA: Insufficient documentation

## 2017-04-03 DIAGNOSIS — Z79899 Other long term (current) drug therapy: Secondary | ICD-10-CM | POA: Diagnosis not present

## 2017-04-03 DIAGNOSIS — R1310 Dysphagia, unspecified: Secondary | ICD-10-CM | POA: Insufficient documentation

## 2017-04-03 HISTORY — DX: Presence of dental prosthetic device (complete) (partial): Z97.2

## 2017-04-03 HISTORY — PX: ESOPHAGOGASTRODUODENOSCOPY (EGD) WITH PROPOFOL: SHX5813

## 2017-04-03 SURGERY — ESOPHAGOGASTRODUODENOSCOPY (EGD) WITH PROPOFOL
Anesthesia: General | Wound class: Clean Contaminated

## 2017-04-03 MED ORDER — LACTATED RINGERS IV SOLN
INTRAVENOUS | Status: DC
  Administered 2017-04-03: 07:00:00 via INTRAVENOUS

## 2017-04-03 MED ORDER — GLYCOPYRROLATE 0.2 MG/ML IJ SOLN
INTRAMUSCULAR | Status: DC | PRN
  Administered 2017-04-03: 0.1 mg via INTRAVENOUS

## 2017-04-03 MED ORDER — LIDOCAINE HCL (CARDIAC) 20 MG/ML IV SOLN
INTRAVENOUS | Status: DC | PRN
  Administered 2017-04-03: 50 mg via INTRAVENOUS

## 2017-04-03 MED ORDER — SODIUM CHLORIDE 0.9 % IV SOLN: INTRAVENOUS | Status: DC

## 2017-04-03 MED ORDER — PROPOFOL 10 MG/ML IV BOLUS
INTRAVENOUS | Status: DC | PRN
  Administered 2017-04-03: 50 mg via INTRAVENOUS
  Administered 2017-04-03: 10 mg via INTRAVENOUS

## 2017-04-03 SURGICAL SUPPLY — 32 items
BALLN DILATOR 10-12 8 (BALLOONS)
BALLN DILATOR 12-15 8 (BALLOONS)
BALLN DILATOR 15-18 8 (BALLOONS)
BALLN DILATOR CRE 0-12 8 (BALLOONS)
BALLN DILATOR ESOPH 8 10 CRE (MISCELLANEOUS) IMPLANT
BALLOON DILATOR 12-15 8 (BALLOONS) IMPLANT
BALLOON DILATOR 15-18 8 (BALLOONS) IMPLANT
BALLOON DILATOR CRE 0-12 8 (BALLOONS) IMPLANT
BLOCK BITE 60FR ADLT L/F GRN (MISCELLANEOUS) ×3 IMPLANT
CANISTER SUCT 1200ML W/VALVE (MISCELLANEOUS) ×3 IMPLANT
CLIP HMST 235XBRD CATH ROT (MISCELLANEOUS) IMPLANT
CLIP RESOLUTION 360 11X235 (MISCELLANEOUS)
FCP ESCP3.2XJMB 240X2.8X (MISCELLANEOUS)
FORCEPS BIOP RAD 4 LRG CAP 4 (CUTTING FORCEPS) ×2 IMPLANT
FORCEPS BIOP RJ4 240 W/NDL (MISCELLANEOUS)
FORCEPS ESCP3.2XJMB 240X2.8X (MISCELLANEOUS) IMPLANT
GOWN CVR UNV OPN BCK APRN NK (MISCELLANEOUS) ×2 IMPLANT
GOWN ISOL THUMB LOOP REG UNIV (MISCELLANEOUS) ×6
INJECTOR VARIJECT VIN23 (MISCELLANEOUS) IMPLANT
KIT DEFENDO VALVE AND CONN (KITS) IMPLANT
KIT ENDO PROCEDURE OLY (KITS) ×3 IMPLANT
MARKER SPOT ENDO TATTOO 5ML (MISCELLANEOUS) IMPLANT
PAD GROUND ADULT SPLIT (MISCELLANEOUS) IMPLANT
RETRIEVER NET PLAT FOOD (MISCELLANEOUS) IMPLANT
SNARE SHORT THROW 13M SML OVAL (MISCELLANEOUS) IMPLANT
SNARE SHORT THROW 30M LRG OVAL (MISCELLANEOUS) IMPLANT
SPOT EX ENDOSCOPIC TATTOO (MISCELLANEOUS)
SYR INFLATION 60ML (SYRINGE) IMPLANT
TRAP ETRAP POLY (MISCELLANEOUS) IMPLANT
VARIJECT INJECTOR VIN23 (MISCELLANEOUS)
WATER STERILE IRR 250ML POUR (IV SOLUTION) ×3 IMPLANT
WIRE CRE 18-20MM 8CM F G (MISCELLANEOUS) IMPLANT

## 2017-04-03 NOTE — Anesthesia Postprocedure Evaluation (Signed)
Anesthesia Post Note  Patient: Megan Bradley  Procedure(s) Performed: ESOPHAGOGASTRODUODENOSCOPY (EGD) WITH PROPOFOL (N/A )  Patient location during evaluation: PACU Anesthesia Type: General Level of consciousness: awake and alert Pain management: pain level controlled Vital Signs Assessment: post-procedure vital signs reviewed and stable Respiratory status: spontaneous breathing Cardiovascular status: blood pressure returned to baseline Postop Assessment: no headache Anesthetic complications: no    Jaci Standard, III,  Thelbert Gartin D

## 2017-04-03 NOTE — Anesthesia Procedure Notes (Signed)
Date/Time: 04/03/2017 7:55 AM Performed by: Cameron Ali Pre-anesthesia Checklist: Patient identified, Emergency Drugs available, Suction available, Timeout performed and Patient being monitored Patient Re-evaluated:Patient Re-evaluated prior to induction Oxygen Delivery Method: Nasal cannula Placement Confirmation: positive ETCO2

## 2017-04-03 NOTE — Anesthesia Preprocedure Evaluation (Signed)
Anesthesia Evaluation  Patient identified by MRN, date of birth, ID band Patient awake    Reviewed: Allergy & Precautions, H&P , NPO status , Patient's Chart, lab work & pertinent test results  Airway Mallampati: III  TM Distance: >3 FB     Dental no notable dental hx.    Pulmonary sleep apnea and Continuous Positive Airway Pressure Ventilation ,    Pulmonary exam normal        Cardiovascular negative cardio ROS Normal cardiovascular exam     Neuro/Psych negative neurological ROS     GI/Hepatic Neg liver ROS, Medicated,  Endo/Other  diabetesHypothyroidism   Renal/GU   negative genitourinary   Musculoskeletal   Abdominal   Peds  Hematology   Anesthesia Other Findings   Reproductive/Obstetrics                             Anesthesia Physical Anesthesia Plan  ASA: II  Anesthesia Plan: General   Post-op Pain Management:    Induction:   PONV Risk Score and Plan:   Airway Management Planned:   Additional Equipment:   Intra-op Plan:   Post-operative Plan:   Informed Consent: I have reviewed the patients History and Physical, chart, labs and discussed the procedure including the risks, benefits and alternatives for the proposed anesthesia with the patient or authorized representative who has indicated his/her understanding and acceptance.     Plan Discussed with:   Anesthesia Plan Comments:         Anesthesia Quick Evaluation

## 2017-04-03 NOTE — Op Note (Signed)
[facility] Gastroenterology Patient Name: Megan Bradley Procedure Date: 04/03/2017 7:34 AM MRN: [id number] Account #: [id number] Date of Birth: [birth year] Admit Type: Outpatient Age: 76 Room: [facility] OR ROOM 01 Gender: Female Note Status: Finalized Procedure:            Upper GI endoscopy Indications:          Dysphagia with a history of nutcrackers esophagus Providers:            Lucilla Lame MD, MD Referring MD:         Deborra Medina, MD (Referring MD) Medicines:            Propofol per Anesthesia Complications:        No immediate complications. Procedure:            Pre-Anesthesia Assessment:                       - Prior to the procedure, a History and Physical was                        performed, and patient medications and allergies were                        reviewed. The patient's tolerance of previous                        anesthesia was also reviewed. The risks and benefits of                        the procedure and the sedation options and risks were                        discussed with the patient. All questions were                        answered, and informed consent was obtained. Prior                        Anticoagulants: The patient has taken no previous                        anticoagulant or antiplatelet agents. ASA Grade                        Assessment: II - A patient with mild systemic disease.                        After reviewing the risks and benefits, the patient was                        deemed in satisfactory condition to undergo the                        procedure.                       After obtaining informed consent, the endoscope was                        passed under direct vision. Throughout the procedure,  the patient's blood pressure, pulse, and oxygen                        saturations were monitored continuously. The Olympus                        190 Endoscope [id number]) was introduced through  the                        mouth, and advanced to the second part of duodenum. The                        upper GI endoscopy was accomplished without difficulty.                        The patient tolerated the procedure well. Findings:      The examined esophagus was normal.      Multiple sessile polyps with no stigmata of recent bleeding were found       in the gastric fundus. Biopsies were taken with a cold forceps for       histology.      The examined duodenum was normal. Impression:           - Normal esophagus.                       - Multiple gastric polyps. Biopsied.                       - Normal examined duodenum. Recommendation:       - Discharge patient to home.                       - Resume previous diet.                       - Continue present medications.                       - Await pathology results. Procedure Code(s):    --- Professional ---                       [phone], Esophagogastroduodenoscopy, flexible, transoral;                        with biopsy, single or multiple Diagnosis Code(s):    --- Professional ---                       R13.10, Dysphagia, unspecified                       K31.7, Polyp of stomach and duodenum CPT copyright 2016 American Medical Association. All rights reserved. The codes documented in this report are preliminary and upon coder review may  be revised to meet current compliance requirements. Lucilla Lame MD, MD 04/03/2017 8:08:28 AM This report has been signed electronically. Number of Addenda: 0 Note Initiated On: 04/03/2017 7:34 AM      [facility]

## 2017-04-03 NOTE — H&P (Signed)
Lucilla Lame, MD [address] [place], [place] [address] Phone:[phone] Fax : [phone]  Primary Care Physician:  Crecencio Mc, MD Primary Gastroenterologist:  Dr. Allen Norris  Pre-Procedure History & Physical: HPI:  Megan Bradley is a 76 y.o. female is here for an endoscopy.   Past Medical History:  Diagnosis Date  . Anxiety   . Anxiety disorder   . Cervical dysplasia    a. 2010 - high grade squamous intraepithelial lesion s/p excision.  . Chest pain    a. 2006 or 2007 Cath [facility]): reportedly nl cors;  b. 09/2011 ETT: nl.  . GERD (gastroesophageal reflux disease)   . Herpes zoster   . Hypothyroidism   . Orthostatic hypotension   . Pre-diabetes   . RBBB   . Vertigo   . Wears dentures    partial lower    Past Surgical History:  Procedure Laterality Date  . CARDIAC CATHETERIZATION  2003   Dr.Callwood, R/L heart cath,  . ESOPHAGEAL MANOMETRY N/A 06/19/2016   Procedure: ESOPHAGEAL MANOMETRY (EM);  Surgeon: Lucilla Lame, MD;  Location: [facility] ENDOSCOPY;  Service: Endoscopy;  Laterality: N/A;  . THYROIDECTOMY  05/15/15   [facility]    Prior to Admission medications   Medication Sig Start Date End Date Taking? Authorizing Provider  ALPRAZolam Duanne Moron) 0.5 MG tablet Take 1 tablet (0.5 mg total) by mouth at bedtime as needed for anxiety. 05/08/16  Yes Crecencio Mc, MD  CARTIA XT 120 MG 24 hr capsule TAKE 1 CAPSULE BY MOUTH ONCE DAILY 03/17/17  Yes Crecencio Mc, MD  Cholecalciferol (VITAMIN D) 2000 UNITS CAPS Take 1 capsule by mouth daily.     Yes [provider]  ezetimibe (ZETIA) 10 MG tablet TAKE 1 TABLET BY MOUTH  DAILY 12/05/16  Yes Crecencio Mc, MD  levothyroxine (SYNTHROID, LEVOTHROID) 100 MCG tablet TAKE 1 TABLET BY MOUTH ONCE DAILY BEFORE BREAKFAST 11/17/16  Yes Crecencio Mc, MD  meclizine (ANTIVERT) 25 MG tablet TAKE ONE TABLET BY MOUTH THREE TIMES DAILY AS NEEDED FOR  VERTIGO 09/03/16  Yes Crecencio Mc, MD  nitroGLYCERIN (NITROSTAT) 0.4  MG SL tablet Place 1 tablet (0.4 mg total) under the tongue every 5 (five) minutes as needed for chest pain. 07/12/15  Yes Crecencio Mc, MD  omeprazole (PRILOSEC) 40 MG capsule Take 1 capsule by mouth  daily 02/28/16  Yes Crecencio Mc, MD  sertraline (ZOLOFT) 100 MG tablet TAKE 1 AND 1/2 TABLETS BY  MOUTH DAILY 10/21/16  Yes Crecencio Mc, MD  furosemide (LASIX) 20 MG tablet Take 1 tablet (20 mg total) by mouth daily as needed. For fluid retention 10/13/16   Crecencio Mc, MD  glucose blood test strip For One touch Verio Flex .  Use to check 3 times daily   e11.9 06/29/15   Crecencio Mc, MD    Allergies as of 04/01/2017 - Review Complete 04/01/2017  Allergen Reaction Noted  . Atorvastatin Other (See Comments) 06/14/2013  . Codeine Nausea And Vomiting 09/12/2010  . Fentanyl Nausea And Vomiting 09/12/2010  . Lipitor [atorvastatin calcium]  09/12/2010  . Statins    . Tetracyclines & related Hives 09/12/2010    Family History  Problem Relation Age of Onset  . Heart attack Mother   . Alcohol abuse Mother   . Mental illness Mother        died in her 23's of alzheimer's Dementia  . Heart disease Mother 37  . Heart attack Father   .  Heart disease Father 60       AMI - died @ 42.  . Colon cancer Paternal Grandmother 55    Social History   Social History  . Marital status: Married    Spouse name: N/A  . Number of children: N/A  . Years of education: N/A   Occupational History  . Not on file.   Social History Main Topics  . Smoking status: Never Smoker  . Smokeless tobacco: Never Used  . Alcohol use Yes     Comment: wine once a week  . Drug use: No  . Sexual activity: No   Other Topics Concern  . Not on file   Social History Narrative   Lives in [place].  Active but doesn't exercise. Retired from [employer]: See HPI, otherwise negative ROS  Physical Exam: BP 128/70   Pulse 70   Temp (!) 97.3 F (36.3 C) (Temporal)   Resp 16   Ht 5' 5.5"  (1.664 m)   Wt 150 lb (68 kg)   SpO2 100%   BMI 24.58 kg/m  General:   Alert,  pleasant and cooperative in NAD Head:  Normocephalic and atraumatic. Neck:  Supple; no masses or thyromegaly. Lungs:  Clear throughout to auscultation.    Heart:  Regular rate and rhythm. Abdomen:  Soft, nontender and nondistended. Normal bowel sounds, without guarding, and without rebound.   Neurologic:  Alert and  oriented x4;  grossly normal neurologically.  Impression/Plan: Megan Bradley is here for an endoscopy to be performed for dysphagia  Risks, benefits, limitations, and alternatives regarding  endoscopy have been reviewed with the patient.  Questions have been answered.  All parties agreeable.   Lucilla Lame, MD  04/03/2017, 7:50 AM

## 2017-04-03 NOTE — Transfer of Care (Signed)
Immediate Anesthesia Transfer of Care Note  Patient: Megan Bradley  Procedure(s) Performed: ESOPHAGOGASTRODUODENOSCOPY (EGD) WITH PROPOFOL (N/A )  Patient Location: PACU  Anesthesia Type: General  Level of Consciousness: awake, alert  and patient cooperative  Airway and Oxygen Therapy: Patient Spontanous Breathing and Patient connected to supplemental oxygen  Post-op Assessment: Post-op Vital signs reviewed, Patient's Cardiovascular Status Stable, Respiratory Function Stable, Patent Airway and No signs of Nausea or vomiting  Post-op Vital Signs: Reviewed and stable  Complications: No apparent anesthesia complications

## 2017-04-06 ENCOUNTER — Encounter: Payer: Self-pay | Admitting: Gastroenterology

## 2017-04-06 ENCOUNTER — Telehealth: Payer: Self-pay

## 2017-04-06 ENCOUNTER — Ambulatory Visit (INDEPENDENT_AMBULATORY_CARE_PROVIDER_SITE_OTHER): Payer: Medicare Other | Admitting: Internal Medicine

## 2017-04-06 DIAGNOSIS — R0789 Other chest pain: Secondary | ICD-10-CM

## 2017-04-06 DIAGNOSIS — F418 Other specified anxiety disorders: Secondary | ICD-10-CM | POA: Diagnosis not present

## 2017-04-06 DIAGNOSIS — R1319 Other dysphagia: Secondary | ICD-10-CM | POA: Diagnosis not present

## 2017-04-06 NOTE — Telephone Encounter (Signed)
Order has been faxed

## 2017-04-06 NOTE — Progress Notes (Signed)
Subjective:  Patient ID: Megan Bradley, female    DOB: 13-Apr-1941  Age: 76 y.o. MRN: [id number]  CC: Diagnoses of Other dysphagia, Other chest pain, and Anxiety with obsessional features were pertinent to this visit.  HPI Megan Bradley presents for ER  FOLLOW UP.  Severe chest pain,  EMS called.  TREATED IN ER ON Oct 16 for chest pain .  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   No issues.  Was seen by GI last week  . Was taking diltiazem again and omeprazole 40 mg bid   , given rx for dexilant.  EGD done Oct 26   No strictures were found  Multiple sessile polyps were noted and biopsied.  Had abd pain for several hours from the distension.  Has been having normal bowel movements   Increased anxiety since moving into the new house.  Has had to use alprazolam during the day on several occasional .  Intentional weight lss of 12 lbs sicne Dec 2017 eating loss.      Outpatient Medications Prior to Visit  Medication Sig Dispense Refill  . ALPRAZolam (XANAX) 0.5 MG tablet Take 1 tablet (0.5 mg total) by mouth at bedtime as needed for anxiety. 30 tablet 5  . CARTIA XT 120 MG 24 hr capsule TAKE 1 CAPSULE BY MOUTH ONCE DAILY 90 capsule 1  . Cholecalciferol (VITAMIN D) 2000 UNITS CAPS Take 1 capsule by mouth daily.      Marland Kitchen ezetimibe (ZETIA) 10 MG tablet TAKE 1 TABLET BY MOUTH  DAILY 90 tablet 0  . furosemide (LASIX) 20 MG tablet Take 1 tablet (20 mg total) by mouth daily as needed. For fluid retention 30 tablet 3  . glucose blood test strip For One touch Verio Flex .  Use to check 3 times daily   e11.9 100 each 0  . levothyroxine (SYNTHROID, LEVOTHROID) 100 MCG tablet TAKE 1 TABLET BY MOUTH ONCE DAILY BEFORE BREAKFAST 90 tablet 1  . meclizine (ANTIVERT) 25 MG tablet TAKE ONE TABLET BY MOUTH THREE TIMES DAILY AS NEEDED FOR  VERTIGO 30 tablet 1  . nitroGLYCERIN (NITROSTAT) 0.4 MG SL tablet Place 1 tablet (0.4 mg  total) under the tongue every 5 (five) minutes as needed for chest pain. 50 tablet 3  . omeprazole (PRILOSEC) 40 MG capsule Take 1 capsule by mouth  daily 90 capsule 3  . sertraline (ZOLOFT) 100 MG tablet TAKE 1 AND 1/2 TABLETS BY  MOUTH DAILY 135 tablet 3   Facility-Administered Medications Prior to Visit  Medication Dose Route Frequency Provider Last Rate Last Dose  . lidocaine (XYLOCAINE) 1 % (with pres) injection 4 mL  4 mL Other Once Crecencio Mc, MD      . triamcinolone acetonide (KENALOG-40) injection 20 mg  20 mg Intra-articular Once Crecencio Mc, MD      . triamcinolone acetonide (KENALOG-40) injection 20 mg  20 mg Intramuscular Once Crecencio Mc, MD        Review of Systems;  Patient denies headache, fevers, malaise, unintentional weight loss, skin rash, eye pain, sinus congestion and sinus pain, sore throat, dysphagia,  hemoptysis , cough, dyspnea, wheezing, chest pain, palpitations, orthopnea, edema, abdominal pain, nausea, melena, diarrhea, constipation, flank pain, dysuria, hematuria, urinary  Frequency, nocturia, numbness, tingling, seizures,  Focal weakness, Loss of consciousness,  Tremor, insomnia, depression, anxiety, and suicidal ideation.      Objective:  BP 114/60 (BP Location: Left Arm, Patient Position: Sitting, Cuff Size: Normal)   Pulse 64   Temp 97.8 F (36.6 C) (Oral)   Resp 15   Ht 5' 5.5" (1.664 m)   Wt 150 lb (68 kg)   SpO2 95%   BMI 24.58 kg/m   BP Readings from Last 3 Encounters:  04/06/17 114/60  04/03/17 121/68  04/01/17 129/70    Wt Readings from Last 3 Encounters:  04/06/17 150 lb (68 kg)  04/03/17 150 lb (68 kg)  04/01/17 152 lb 8 oz (69.2 kg)    General appearance: alert, cooperative and appears stated age Ears: normal TM's and external ear canals both ears Throat: lips, mucosa, and tongue normal; teeth and gums normal Neck: no adenopathy, no carotid bruit, supple, symmetrical, trachea midline and thyroid not enlarged,  symmetric, no tenderness/mass/nodules Back: symmetric, no curvature. ROM normal. No CVA tenderness. Lungs: clear to auscultation bilaterally Heart: regular rate and rhythm, S1, S2 normal, no murmur, click, rub or gallop Abdomen: soft, non-tender; bowel sounds normal; no masses,  no organomegaly Pulses: 2+ and symmetric Skin: Skin color, texture, turgor normal. No rashes or lesions Lymph nodes: Cervical, supraclavicular, and axillary nodes normal.  Lab Results  Component Value Date   HGBA1C 6.7 (H) 02/27/2017   HGBA1C 6.5 07/16/2016   HGBA1C 6.5 01/02/2016    Lab Results  Component Value Date   CREATININE 0.95 03/24/2017   CREATININE 1.05 02/27/2017   CREATININE 1.03 09/18/2016    Lab Results  Component Value Date   WBC 9.1 03/24/2017   HGB 12.2 03/24/2017   HCT 36.2 03/24/2017   PLT 156 03/24/2017   GLUCOSE 131 (H) 03/24/2017   CHOL 207 (H) 02/27/2017   TRIG 53.0 02/27/2017   HDL 76.10 02/27/2017   LDLDIRECT 93.0 01/02/2016   LDLCALC 120 (H) 02/27/2017   ALT 16 02/27/2017   AST 16 02/27/2017   NA 140 03/24/2017   K 3.6 03/24/2017   CL 105 03/24/2017   CREATININE 0.95 03/24/2017   BUN 19 03/24/2017   CO2 27 03/24/2017   TSH 2.40 02/27/2017   INR 0.9 12/05/2011   HGBA1C 6.7 (H) 02/27/2017   MICROALBUR <0.7 01/02/2016    No results found.  Assessment & Plan:   Problem List Items Addressed This Visit    Anxiety with obsessional features    Aggravated by recent residential move.has had to use alprazolam prn daytime symptoms  Reassurance provided.       Chest pain    She ruled out for AMI during recent ER visit . Pain was noncardiac and has not returned since resuming Cartia       Other dysphagia    Secondary to nutcracker esophagus,  No strictures found on 04/03/17 EGD         A total of 25 minutes of face to face time was spent with patient more than half of which was spent in counselling about the above mentioned conditions  and coordination of care    I am having Megan Bradley maintain her Vitamin D, glucose blood, nitroGLYCERIN, omeprazole, ALPRAZolam, meclizine, furosemide, sertraline, levothyroxine, ezetimibe, and CARTIA XT. We will continue to administer triamcinolone acetonide, triamcinolone acetonide, and lidocaine.  No orders of the defined types were placed in this encounter.   There are no discontinued medications.  Follow-up: No Follow-up on file.   Crecencio Mc, MD

## 2017-04-06 NOTE — Telephone Encounter (Signed)
-----   Message from Crecencio Mc, MD sent at 04/06/2017  5:57 PM EDT ----- Needs cologuard

## 2017-04-07 ENCOUNTER — Encounter: Payer: Self-pay | Admitting: Gastroenterology

## 2017-04-07 NOTE — Assessment & Plan Note (Signed)
Aggravated by recent residential move.has had to use alprazolam prn daytime symptoms  Reassurance provided.

## 2017-04-07 NOTE — Assessment & Plan Note (Signed)
She ruled out for AMI during recent ER visit . Pain was noncardiac and has not returned since resuming [place]

## 2017-04-07 NOTE — Assessment & Plan Note (Signed)
Secondary to nutcracker esophagus,  No strictures found on 04/03/17 EGD

## 2017-04-11 ENCOUNTER — Other Ambulatory Visit: Payer: Self-pay | Admitting: Internal Medicine

## 2017-04-15 DIAGNOSIS — Z1211 Encounter for screening for malignant neoplasm of colon: Secondary | ICD-10-CM | POA: Diagnosis not present

## 2017-04-15 DIAGNOSIS — Z1212 Encounter for screening for malignant neoplasm of rectum: Secondary | ICD-10-CM | POA: Diagnosis not present

## 2017-04-22 LAB — COLOGUARD: Cologuard: POSITIVE

## 2017-04-23 ENCOUNTER — Telehealth: Payer: Self-pay | Admitting: *Deleted

## 2017-04-23 NOTE — Telephone Encounter (Signed)
Copied from [place] [id number]. Topic: Inquiry >> Apr 23, 2017 12:40 PM Corie Chiquito, [place] wrote: Reason for HOO:[facility] called to see if you all had received a fax about a abnormal cologuard from them about this patient. The order number is [id number]. They can be reached at [phone]

## 2017-04-23 NOTE — Telephone Encounter (Signed)
Patients results are in dr. Lupita Dawn yellow folder for review.

## 2017-04-23 NOTE — Telephone Encounter (Signed)
Let  Me know if you have not seen this I can down load from Cologuard.

## 2017-05-03 ENCOUNTER — Telehealth: Payer: Self-pay | Admitting: Internal Medicine

## 2017-05-03 DIAGNOSIS — R195 Other fecal abnormalities: Secondary | ICD-10-CM | POA: Insufficient documentation

## 2017-05-03 NOTE — Assessment & Plan Note (Signed)
Nov 2019 referral recommended

## 2017-05-03 NOTE — Telephone Encounter (Signed)
The results of patient's cologuard is  Positive. This may indicate a polyp somewhere in the colon.  I  would like to refer her to GI for further evaluation .    Is she  willing to see Dr Allen Norris or dose he have another preference?

## 2017-05-05 ENCOUNTER — Other Ambulatory Visit: Payer: Self-pay

## 2017-05-05 NOTE — Telephone Encounter (Signed)
Spoke with pt and informed her of her cologuard results. Pt stated that Dr. Allen Norris is fine.

## 2017-05-13 ENCOUNTER — Other Ambulatory Visit: Payer: Self-pay | Admitting: Internal Medicine

## 2017-05-13 ENCOUNTER — Other Ambulatory Visit: Payer: Self-pay | Admitting: *Deleted

## 2017-05-13 NOTE — Telephone Encounter (Signed)
Thyroid medication is pended. Review for refill of Xanax

## 2017-05-13 NOTE — Telephone Encounter (Signed)
Copied from [place] [id number]. Topic: Quick Communication - Rx Refill/Question >> May 13, 2017  9:19 AM Carolyn Stare wrote: Has the patient contacted their pharmacy yes       ALPRAZolam Duanne Moron) 0.5 MG tablet  Preferred Pharmacy (with phone number or street name  [facility]: Please be advised that RX refills may take up to 3 business days. We ask that you follow-up with your pharmacy.

## 2017-05-14 ENCOUNTER — Other Ambulatory Visit: Payer: Self-pay | Admitting: Internal Medicine

## 2017-05-14 MED ORDER — ALPRAZOLAM 0.5 MG PO TABS: 0.5000 mg | ORAL_TABLET | Freq: Every evening | ORAL | 5 refills | Status: DC | PRN

## 2017-05-20 ENCOUNTER — Encounter: Payer: Self-pay | Admitting: Internal Medicine

## 2017-05-21 ENCOUNTER — Telehealth: Payer: Self-pay | Admitting: Gastroenterology

## 2017-05-21 NOTE — Telephone Encounter (Signed)
----   Message ----- From: Eustace Pen Sent: 05/21/2017   3:26 PM To: Arie Sabina Subject: GI referral                                    Hey Tyana Butzer, Dr. Derrel Nip has referred this patient over to Dr.Wohl for a colonoscopy d/t positive cologuard. The patient is very anxious about getting this done. Is there any way that you or someone in your office can pull the referral and schedule her please? Thank you, [employer]

## 2017-05-21 NOTE — Telephone Encounter (Signed)
LVM for pt to contact office to schedule her colonoscopy.  Thanks Caryn Gienger 

## 2017-05-22 ENCOUNTER — Telehealth: Payer: Self-pay

## 2017-05-22 ENCOUNTER — Other Ambulatory Visit: Payer: Self-pay

## 2017-05-22 DIAGNOSIS — R195 Other fecal abnormalities: Secondary | ICD-10-CM

## 2017-05-22 NOTE — Telephone Encounter (Signed)
Gastroenterology Pre-Procedure Review  Request Date: 06/12/17 Requesting Physician: Dr. Allen Norris  PATIENT REVIEW QUESTIONS: The patient responded to the following health history questions as indicated:    1. Are you having any GI issues? Positive Cologuard 2. Do you have a personal history of Polyps? unsure 3. Do you have a family history of Colon Cancer or Polyps? no 4. Diabetes Mellitus? yes (controlled by diet ) 5. Joint replacements in the past 12 months?no 6. Major health problems in the past 3 months?no 7. Any artificial heart valves, MVP, or defibrillator?no    MEDICATIONS & ALLERGIES:    Patient reports the following regarding taking any anticoagulation/antiplatelet therapy:   Plavix, Coumadin, Eliquis, Xarelto, Lovenox, Pradaxa, Brilinta, or Effient? no Aspirin? no  Patient confirms/reports the following medications:  Current Outpatient Medications  Medication Sig Dispense Refill  . ALPRAZolam (XANAX) 0.5 MG tablet Take 1 tablet (0.5 mg total) by mouth at bedtime as needed for anxiety. 30 tablet 5  . CARTIA XT 120 MG 24 hr capsule TAKE 1 CAPSULE BY MOUTH ONCE DAILY 90 capsule 1  . Cholecalciferol (VITAMIN D) 2000 UNITS CAPS Take 1 capsule by mouth daily.      Marland Kitchen ezetimibe (ZETIA) 10 MG tablet TAKE 1 TABLET BY MOUTH  DAILY 90 tablet 2  . furosemide (LASIX) 20 MG tablet Take 1 tablet (20 mg total) by mouth daily as needed. For fluid retention 30 tablet 3  . glucose blood test strip For One touch Verio Flex .  Use to check 3 times daily   e11.9 100 each 0  . levothyroxine (SYNTHROID, LEVOTHROID) 100 MCG tablet TAKE 1 TABLET BY MOUTH ONCE DAILY BEFORE BREAKFAST 90 tablet 1  . meclizine (ANTIVERT) 25 MG tablet TAKE ONE TABLET BY MOUTH THREE TIMES DAILY AS NEEDED FOR  VERTIGO 30 tablet 1  . nitroGLYCERIN (NITROSTAT) 0.4 MG SL tablet Place 1 tablet (0.4 mg total) under the tongue every 5 (five) minutes as needed for chest pain. 50 tablet 3  . omeprazole (PRILOSEC) 40 MG capsule TAKE 1  CAPSULE BY MOUTH  DAILY 90 capsule 2  . sertraline (ZOLOFT) 100 MG tablet TAKE 1 AND 1/2 TABLETS BY  MOUTH DAILY 135 tablet 3   Current Facility-Administered Medications  Medication Dose Route Frequency Provider Last Rate Last Dose  . lidocaine (XYLOCAINE) 1 % (with pres) injection 4 mL  4 mL Other Once Crecencio Mc, MD      . triamcinolone acetonide (KENALOG-40) injection 20 mg  20 mg Intra-articular Once Crecencio Mc, MD      . triamcinolone acetonide (KENALOG-40) injection 20 mg  20 mg Intramuscular Once Crecencio Mc, MD        Patient confirms/reports the following allergies:  Allergies  Allergen Reactions  . Atorvastatin Other (See Comments)    Muscle cramps and weakness  . Codeine Nausea And Vomiting  . Fentanyl Nausea And Vomiting  . Lipitor [Atorvastatin Calcium]     Muscle cramps and weakness  . Statins     Muscle cramps and weakness  . Tetracyclines & Related Hives    No orders of the defined types were placed in this encounter.   AUTHORIZATION INFORMATION Primary Insurance: 1D#: Group #:  Secondary Insurance: 1D#: Group #:  SCHEDULE INFORMATION: Date: 06/12/17 Time: Location:

## 2017-05-28 ENCOUNTER — Ambulatory Visit: Payer: Medicare Other

## 2017-06-03 ENCOUNTER — Encounter: Payer: Self-pay | Admitting: *Deleted

## 2017-06-03 ENCOUNTER — Other Ambulatory Visit: Payer: Self-pay

## 2017-06-11 ENCOUNTER — Other Ambulatory Visit: Payer: Self-pay | Admitting: Internal Medicine

## 2017-06-11 ENCOUNTER — Telehealth: Payer: Self-pay

## 2017-06-11 MED ORDER — ONDANSETRON 4 MG PO TBDP: 4.0000 mg | ORAL_TABLET | Freq: Three times a day (TID) | ORAL | 0 refills | Status: DC | PRN

## 2017-06-11 NOTE — Telephone Encounter (Signed)
Pt has been informed to begin bowel prep at 5pm mix with 16 oz of cold water and repeat with the second bottle 5 hours prior to procedure , drink an additional 8 oz of water and this should be the last thing that she has to eat or drink prior to colonoscopy.  Thanks [employer]

## 2017-06-11 NOTE — Discharge Instructions (Signed)
General Anesthesia, Adult, Care After °These instructions provide you with information about caring for yourself after your procedure. Your health care provider may also give you more specific instructions. Your treatment has been planned according to current medical practices, but problems sometimes occur. Call your health care provider if you have any problems or questions after your procedure. °What can I expect after the procedure? °After the procedure, it is common to have: °· Vomiting. °· A sore throat. °· Mental slowness. ° °It is common to feel: °· Nauseous. °· Cold or shivery. °· Sleepy. °· Tired. °· Sore or achy, even in parts of your body where you did not have surgery. ° °Follow these instructions at home: °For at least 24 hours after the procedure: °· Do not: °? Participate in activities where you could fall or become injured. °? Drive. °? Use heavy machinery. °? Drink alcohol. °? Take sleeping pills or medicines that cause drowsiness. °? Make important decisions or sign legal documents. °? Take care of children on your own. °· Rest. °Eating and drinking °· If you vomit, drink water, juice, or soup when you can drink without vomiting. °· Drink enough fluid to keep your urine clear or pale yellow. °· Make sure you have little or no nausea before eating solid foods. °· Follow the diet recommended by your health care provider. °General instructions °· Have a responsible adult stay with you until you are awake and alert. °· Return to your normal activities as told by your health care provider. Ask your health care provider what activities are safe for you. °· Take over-the-counter and prescription medicines only as told by your health care provider. °· If you smoke, do not smoke without supervision. °· Keep all follow-up visits as told by your health care provider. This is important. °Contact a health care provider if: °· You continue to have nausea or vomiting at home, and medicines are not helpful. °· You  cannot drink fluids or start eating again. °· You cannot urinate after 8-12 hours. °· You develop a skin rash. °· You have fever. °· You have increasing redness at the site of your procedure. °Get help right away if: °· You have difficulty breathing. °· You have chest pain. °· You have unexpected bleeding. °· You feel that you are having a life-threatening or urgent problem. °This information is not intended to replace advice given to you by your health care provider. Make sure you discuss any questions you have with your health care provider. °Document Released: 09/01/2000 Document Revised: 10/29/2015 Document Reviewed: 05/10/2015 °Elsevier Interactive Patient Education © 2018 [employer] ° °

## 2017-06-12 ENCOUNTER — Encounter
Admission: RE | Disposition: A | Discharge status: Home / Self Care | Payer: Self-pay | Source: Ambulatory Visit | Attending: Gastroenterology

## 2017-06-12 ENCOUNTER — Ambulatory Visit: Payer: Medicare Other | Admitting: Anesthesiology

## 2017-06-12 ENCOUNTER — Ambulatory Visit
Admission: RE | Admit: 2017-06-12 | Discharge: 2017-06-12 | Disposition: A | Discharge status: Home / Self Care | Payer: Medicare Other | Source: Ambulatory Visit | Attending: Gastroenterology | Admitting: Gastroenterology

## 2017-06-12 DIAGNOSIS — Z1211 Encounter for screening for malignant neoplasm of colon: Secondary | ICD-10-CM | POA: Diagnosis not present

## 2017-06-12 DIAGNOSIS — Z79899 Other long term (current) drug therapy: Secondary | ICD-10-CM | POA: Diagnosis not present

## 2017-06-12 DIAGNOSIS — E119 Type 2 diabetes mellitus without complications: Secondary | ICD-10-CM | POA: Insufficient documentation

## 2017-06-12 DIAGNOSIS — K219 Gastro-esophageal reflux disease without esophagitis: Secondary | ICD-10-CM | POA: Insufficient documentation

## 2017-06-12 DIAGNOSIS — Z885 Allergy status to narcotic agent status: Secondary | ICD-10-CM | POA: Diagnosis not present

## 2017-06-12 DIAGNOSIS — F419 Anxiety disorder, unspecified: Secondary | ICD-10-CM | POA: Diagnosis not present

## 2017-06-12 DIAGNOSIS — D125 Benign neoplasm of sigmoid colon: Secondary | ICD-10-CM | POA: Diagnosis not present

## 2017-06-12 DIAGNOSIS — K641 Second degree hemorrhoids: Secondary | ICD-10-CM | POA: Insufficient documentation

## 2017-06-12 DIAGNOSIS — Z888 Allergy status to other drugs, medicaments and biological substances status: Secondary | ICD-10-CM | POA: Diagnosis not present

## 2017-06-12 DIAGNOSIS — R195 Other fecal abnormalities: Secondary | ICD-10-CM

## 2017-06-12 DIAGNOSIS — Z800 Family history of malignant neoplasm of digestive organs: Secondary | ICD-10-CM | POA: Insufficient documentation

## 2017-06-12 DIAGNOSIS — E039 Hypothyroidism, unspecified: Secondary | ICD-10-CM | POA: Diagnosis not present

## 2017-06-12 DIAGNOSIS — I4510 Unspecified right bundle-branch block: Secondary | ICD-10-CM | POA: Insufficient documentation

## 2017-06-12 DIAGNOSIS — D126 Benign neoplasm of colon, unspecified: Secondary | ICD-10-CM

## 2017-06-12 DIAGNOSIS — R42 Dizziness and giddiness: Secondary | ICD-10-CM | POA: Diagnosis not present

## 2017-06-12 DIAGNOSIS — D123 Benign neoplasm of transverse colon: Secondary | ICD-10-CM

## 2017-06-12 HISTORY — PX: COLONOSCOPY WITH PROPOFOL: SHX5780

## 2017-06-12 LAB — GLUCOSE, CAPILLARY: Glucose-Capillary: 83 mg/dL (ref 65–99)

## 2017-06-12 SURGERY — COLONOSCOPY WITH PROPOFOL
Anesthesia: General | Wound class: Contaminated

## 2017-06-12 MED ORDER — PROPOFOL 10 MG/ML IV BOLUS
INTRAVENOUS | Status: DC | PRN
  Administered 2017-06-12 (×3): 20 mg via INTRAVENOUS
  Administered 2017-06-12: 60 mg via INTRAVENOUS
  Administered 2017-06-12 (×4): 20 mg via INTRAVENOUS
  Administered 2017-06-12: 60 mg via INTRAVENOUS
  Administered 2017-06-12 (×6): 20 mg via INTRAVENOUS
  Administered 2017-06-12: 60 mg via INTRAVENOUS

## 2017-06-12 MED ORDER — SODIUM CHLORIDE 0.9 % IV SOLN: INTRAVENOUS | Status: DC

## 2017-06-12 MED ORDER — STERILE WATER FOR IRRIGATION IR SOLN
Status: DC | PRN
  Administered 2017-06-12: 10:00:00

## 2017-06-12 MED ORDER — LACTATED RINGERS IV SOLN
INTRAVENOUS | Status: DC
  Administered 2017-06-12: 09:00:00 via INTRAVENOUS

## 2017-06-12 MED ORDER — LIDOCAINE HCL (CARDIAC) 20 MG/ML IV SOLN
INTRAVENOUS | Status: DC | PRN
  Administered 2017-06-12: 20 mg via INTRAVENOUS

## 2017-06-12 SURGICAL SUPPLY — 23 items
CANISTER SUCT 1200ML W/VALVE (MISCELLANEOUS) ×2 IMPLANT
CLIP HMST 235XBRD CATH ROT (MISCELLANEOUS) IMPLANT
CLIP RESOLUTION 360 11X235 (MISCELLANEOUS)
FCP ESCP3.2XJMB 240X2.8X (MISCELLANEOUS)
FORCEPS BIOP RAD 4 LRG CAP 4 (CUTTING FORCEPS) IMPLANT
FORCEPS BIOP RJ4 240 W/NDL (MISCELLANEOUS)
FORCEPS ESCP3.2XJMB 240X2.8X (MISCELLANEOUS) IMPLANT
GOWN CVR UNV OPN BCK APRN NK (MISCELLANEOUS) ×2 IMPLANT
GOWN ISOL THUMB LOOP REG UNIV (MISCELLANEOUS) ×4
INJECTOR VARIJECT VIN23 (MISCELLANEOUS) IMPLANT
KIT DEFENDO VALVE AND CONN (KITS) IMPLANT
KIT ENDO PROCEDURE OLY (KITS) ×2 IMPLANT
MARKER SPOT ENDO TATTOO 5ML (MISCELLANEOUS) IMPLANT
PAD GROUND ADULT SPLIT (MISCELLANEOUS) IMPLANT
PROBE APC STR FIRE (PROBE) IMPLANT
RETRIEVER NET ROTH 2.5X230 LF (MISCELLANEOUS) IMPLANT
SNARE SHORT THROW 13M SML OVAL (MISCELLANEOUS) ×1 IMPLANT
SNARE SHORT THROW 30M LRG OVAL (MISCELLANEOUS) IMPLANT
SNARE SNG USE RND 15MM (INSTRUMENTS) IMPLANT
SPOT EX ENDOSCOPIC TATTOO (MISCELLANEOUS)
TRAP ETRAP POLY (MISCELLANEOUS) ×1 IMPLANT
VARIJECT INJECTOR VIN23 (MISCELLANEOUS)
WATER STERILE IRR 250ML POUR (IV SOLUTION) ×2 IMPLANT

## 2017-06-12 NOTE — Anesthesia Postprocedure Evaluation (Signed)
Anesthesia Post Note  Patient: Megan Bradley  Procedure(s) Performed: COLONOSCOPY WITH PROPOFOL (N/A )  Patient location during evaluation: PACU Anesthesia Type: General Level of consciousness: awake and alert Pain management: pain level controlled Vital Signs Assessment: post-procedure vital signs reviewed and stable Respiratory status: spontaneous breathing, nonlabored ventilation, respiratory function stable and patient connected to nasal cannula oxygen Cardiovascular status: blood pressure returned to baseline and stable Postop Assessment: no apparent nausea or vomiting Anesthetic complications: no    Rianne Degraaf

## 2017-06-12 NOTE — Anesthesia Preprocedure Evaluation (Signed)
Anesthesia Evaluation  Patient identified by MRN, date of birth, ID band  Reviewed: NPO status   History of Anesthesia Complications Negative for: history of anesthetic complications  Airway Mallampati: II  TM Distance: >3 FB Neck ROM: full    Dental  (+) Partial Lower   Pulmonary neg pulmonary ROS,    Pulmonary exam normal        Cardiovascular Exercise Tolerance: Good negative cardio ROS Normal cardiovascular exam+ dysrhythmias (RBBB)   Atypical CP > negative cardiac w/u   Neuro/Psych Anxiety vertigo negative neurological ROS     GI/Hepatic Neg liver ROS, GERD  Controlled,  Endo/Other  diabetes (diet ctrl)Hypothyroidism   Renal/GU negative Renal ROS  negative genitourinary   Musculoskeletal   Abdominal   Peds  Hematology negative hematology ROS (+)   Anesthesia Other Findings Sensitive to fentanyl.  TIVA  Reproductive/Obstetrics                             Anesthesia Physical Anesthesia Plan  ASA: II  Anesthesia Plan: General   Post-op Pain Management:    Induction:   PONV Risk Score and Plan:   Airway Management Planned:   Additional Equipment:   Intra-op Plan:   Post-operative Plan:   Informed Consent: I have reviewed the patients History and Physical, chart, labs and discussed the procedure including the risks, benefits and alternatives for the proposed anesthesia with the patient or authorized representative who has indicated his/her understanding and acceptance.     Plan Discussed with: CRNA  Anesthesia Plan Comments:         Anesthesia Quick Evaluation

## 2017-06-12 NOTE — H&P (Signed)
Lucilla Lame, MD [address] [place], [place] [address] Phone:[phone] Fax : [phone]  Primary Care Physician:  Crecencio Mc, MD Primary Gastroenterologist:  Dr. Allen Norris  Pre-Procedure History & Physical: HPI:  Megan Bradley is a 77 y.o. female is here for an colonoscopy.   Past Medical History:  Diagnosis Date  . Anxiety   . Anxiety disorder   . Cervical dysplasia    a. 2010 - high grade squamous intraepithelial lesion s/p excision.  . Chest pain    a. 2006 or 2007 [facility]): reportedly nl cors;  b. 09/2011 ETT: nl.  . GERD (gastroesophageal reflux disease)   . Herpes zoster   . Hypothyroidism   . Orthostatic hypotension   . Pre-diabetes   . RBBB   . Vertigo   . Wears dentures    partial lower    Past Surgical History:  Procedure Laterality Date  . CARDIAC CATHETERIZATION  2003   Dr.Callwood, R/L heart cath,  . ESOPHAGEAL MANOMETRY N/A 06/19/2016   Procedure: ESOPHAGEAL MANOMETRY (EM);  Surgeon: Lucilla Lame, MD;  Location: [facility] ENDOSCOPY;  Service: Endoscopy;  Laterality: N/A;  . ESOPHAGOGASTRODUODENOSCOPY (EGD) WITH PROPOFOL N/A 04/03/2017   Procedure: ESOPHAGOGASTRODUODENOSCOPY (EGD) WITH PROPOFOL;  Surgeon: Lucilla Lame, MD;  Location: [facility];  Service: Endoscopy;  Laterality: N/A;  diabetic - diet controlled  . THYROIDECTOMY  05/15/15   [facility]    Prior to Admission medications   Medication Sig Start Date End Date Taking? Authorizing Provider  ALPRAZolam Duanne Moron) 0.5 MG tablet Take 1 tablet (0.5 mg total) by mouth at bedtime as needed for anxiety. 05/14/17  Yes Crecencio Mc, MD  CARTIA XT 120 MG 24 hr capsule TAKE 1 CAPSULE BY MOUTH ONCE DAILY 03/17/17  Yes Crecencio Mc, MD  Cholecalciferol (VITAMIN D) 2000 UNITS CAPS Take 1 capsule by mouth daily.     Yes [provider]  ezetimibe (ZETIA) 10 MG tablet TAKE 1 TABLET BY MOUTH  DAILY 04/13/17  Yes Crecencio Mc, MD  glucose blood test strip For One touch Verio  Flex .  Use to check 3 times daily   e11.9 06/29/15  Yes Crecencio Mc, MD  levothyroxine (SYNTHROID, LEVOTHROID) 100 MCG tablet TAKE 1 TABLET BY MOUTH ONCE DAILY BEFORE BREAKFAST 05/14/17  Yes Crecencio Mc, MD  meclizine (ANTIVERT) 25 MG tablet TAKE ONE TABLET BY MOUTH THREE TIMES DAILY AS NEEDED FOR  VERTIGO 09/03/16  Yes Crecencio Mc, MD  nitroGLYCERIN (NITROSTAT) 0.4 MG SL tablet Place 1 tablet (0.4 mg total) under the tongue every 5 (five) minutes as needed for chest pain. 07/12/15  Yes Crecencio Mc, MD  omeprazole (PRILOSEC) 40 MG capsule TAKE 1 CAPSULE BY MOUTH  DAILY 04/13/17  Yes Crecencio Mc, MD  ondansetron (ZOFRAN ODT) 4 MG disintegrating tablet Take 1 tablet (4 mg total) by mouth every 8 (eight) hours as needed for nausea or vomiting. 06/11/17  Yes Crecencio Mc, MD  sertraline (ZOLOFT) 100 MG tablet TAKE 1 AND 1/2 TABLETS BY  MOUTH DAILY 10/21/16  Yes Crecencio Mc, MD  furosemide (LASIX) 20 MG tablet Take 1 tablet (20 mg total) by mouth daily as needed. For fluid retention 10/13/16   Crecencio Mc, MD    Allergies as of 05/22/2017 - Review Complete 04/06/2017  Allergen Reaction Noted  . Atorvastatin Other (See Comments) 06/14/2013  . Codeine Nausea And Vomiting 09/12/2010  . Fentanyl Nausea And Vomiting 09/12/2010  . Lipitor [  atorvastatin calcium]  09/12/2010  . Statins    . Tetracyclines & related Hives 09/12/2010    Family History  Problem Relation Age of Onset  . Heart attack Mother   . Alcohol abuse Mother   . Mental illness Mother        died in her 20's of alzheimer's Dementia  . Heart disease Mother 25  . Heart attack Father   . Heart disease Father 44       AMI - died @ 64.  . Colon cancer Paternal Grandmother 57    Social History   Socioeconomic History  . Marital status: Married    Spouse name: Not on file  . Number of children: Not on file  . Years of education: Not on file  . Highest education level: Not on file  Social Needs  .  Financial resource strain: Not on file  . Food insecurity - worry: Not on file  . Food insecurity - inability: Not on file  . Transportation needs - medical: Not on file  . Transportation needs - non-medical: Not on file  Occupational History  . Not on file  Tobacco Use  . Smoking status: Never Smoker  . Smokeless tobacco: Never Used  Substance and Sexual Activity  . Alcohol use: Yes    Comment: wine once a week  . Drug use: No  . Sexual activity: No  Other Topics Concern  . Not on file  Social History Narrative   Lives in [place].  Active but doesn't exercise. Retired from [employer]: See HPI, otherwise negative ROS  Physical Exam: BP (!) 142/76   Pulse 94   Resp 16   Ht 5\' 5"  (1.651 m)   Wt 149 lb (67.6 kg)   SpO2 99%   BMI 24.79 kg/m  General:   Alert,  pleasant and cooperative in NAD Head:  Normocephalic and atraumatic. Neck:  Supple; no masses or thyromegaly. Lungs:  Clear throughout to auscultation.    Heart:  Regular rate and rhythm. Abdomen:  Soft, nontender and nondistended. Normal bowel sounds, without guarding, and without rebound.   Neurologic:  Alert and  oriented x4;  grossly normal neurologically.  Impression/Plan: [name] is here for an colonoscopy to be performed for positive cologard.  Risks, benefits, limitations, and alternatives regarding  colonoscopy have been reviewed with the patient.  Questions have been answered.  All parties agreeable.   Lucilla Lame, MD  06/12/2017, 8:59 AM

## 2017-06-12 NOTE — Transfer of Care (Signed)
Immediate Anesthesia Transfer of Care Note  Patient: Megan Bradley  Procedure(s) Performed: COLONOSCOPY WITH PROPOFOL (N/A )  Patient Location: PACU  Anesthesia Type: General  Level of Consciousness: awake, alert  and patient cooperative  Airway and Oxygen Therapy: Patient Spontanous Breathing and Patient connected to supplemental oxygen  Post-op Assessment: Post-op Vital signs reviewed, Patient's Cardiovascular Status Stable, Respiratory Function Stable, Patent Airway and No signs of Nausea or vomiting  Post-op Vital Signs: Reviewed and stable  Complications: No apparent anesthesia complications

## 2017-06-12 NOTE — Op Note (Signed)
[facility] Gastroenterology Patient Name: Megan Bradley Procedure Date: 06/12/2017 9:48 AM MRN: [id number] Account #: [id number] Date of Birth: [birth year] Admit Type: Outpatient Age: 77 Room: [facility] OR ROOM 01 Gender: Female Note Status: Finalized Procedure:            Colonoscopy Indications:          Positive Cologuard test Providers:            Lucilla Lame MD, MD Referring MD:         Deborra Medina, MD (Referring MD) Medicines:            Propofol per Anesthesia Complications:        No immediate complications. Procedure:            Pre-Anesthesia Assessment:                       - Prior to the procedure, a History and Physical was                        performed, and patient medications and allergies were                        reviewed. The patient's tolerance of previous                        anesthesia was also reviewed. The risks and benefits of                        the procedure and the sedation options and risks were                        discussed with the patient. All questions were                        answered, and informed consent was obtained. Prior                        Anticoagulants: The patient has taken no previous                        anticoagulant or antiplatelet agents. ASA Grade                        Assessment: II - A patient with mild systemic disease.                        After reviewing the risks and benefits, the patient was                        deemed in satisfactory condition to undergo the                        procedure.                       After obtaining informed consent, the colonoscope was                        passed under direct vision. Throughout the procedure,  the patient's blood pressure, pulse, and oxygen                        saturations were monitored continuously. The Olympus                        CF-HQ190L Colonoscope (S#. [id number]) was introduced                        through  the anus and advanced to the the cecum,                        identified by appendiceal orifice and ileocecal valve.                        The colonoscopy was performed without difficulty. The                        patient tolerated the procedure well. The quality of                        the bowel preparation was good. Findings:      The perianal and digital rectal examinations were normal.      Three sessile polyps were found in the transverse colon. The polyps were       4 to 7 mm in size. These polyps were removed with a cold snare.       Resection and retrieval were complete.      Seven sessile polyps were found in the sigmoid colon. The polyps were 3       to 6 mm in size. These polyps were removed with a cold snare. Resection       and retrieval were complete.      Non-bleeding internal hemorrhoids were found during retroflexion. The       hemorrhoids were Grade II (internal hemorrhoids that prolapse but reduce       spontaneously). Impression:           - Three 4 to 7 mm polyps in the transverse colon,                        removed with a cold snare. Resected and retrieved.                       - Seven 3 to 6 mm polyps in the sigmoid colon, removed                        with a cold snare. Resected and retrieved.                       - Non-bleeding internal hemorrhoids. Recommendation:       - Discharge patient to home.                       - Resume previous diet.                       - Continue present medications.                       - Await pathology results.                       -  Repeat colonoscopy in 5 years for surveillance. Procedure Code(s):    --- Professional ---                       [phone], Colonoscopy, flexible; with removal of tumor(s),                        polyp(s), or other lesion(s) by snare technique Diagnosis Code(s):    --- Professional ---                       R19.5, Other fecal abnormalities                       D12.3, Benign neoplasm of  transverse colon (hepatic                        flexure or splenic flexure)                       D12.5, Benign neoplasm of sigmoid colon CPT copyright 2016 American Medical Association. All rights reserved. The codes documented in this report are preliminary and upon coder review may  be revised to meet current compliance requirements. Lucilla Lame MD, MD 06/12/2017 10:30:20 AM This report has been signed electronically. Number of Addenda: 0 Note Initiated On: 06/12/2017 9:48 AM Scope Withdrawal Time: 0 hours 14 minutes 24 seconds  Total Procedure Duration: 0 hours 21 minutes 19 seconds       [facility]

## 2017-06-15 ENCOUNTER — Encounter: Payer: Self-pay | Admitting: Gastroenterology

## 2017-06-17 ENCOUNTER — Ambulatory Visit (INDEPENDENT_AMBULATORY_CARE_PROVIDER_SITE_OTHER): Payer: Medicare Other

## 2017-06-17 VITALS — BP 110/72 | HR 54 | Temp 98.3°F | Resp 14 | Ht 65.2 in | Wt 150.4 lb

## 2017-06-17 DIAGNOSIS — Z0000 Encounter for general adult medical examination without abnormal findings: Secondary | ICD-10-CM | POA: Diagnosis not present

## 2017-06-17 DIAGNOSIS — E119 Type 2 diabetes mellitus without complications: Secondary | ICD-10-CM | POA: Diagnosis not present

## 2017-06-17 DIAGNOSIS — E2839 Other primary ovarian failure: Secondary | ICD-10-CM

## 2017-06-17 DIAGNOSIS — Z1331 Encounter for screening for depression: Secondary | ICD-10-CM | POA: Diagnosis not present

## 2017-06-17 DIAGNOSIS — E039 Hypothyroidism, unspecified: Secondary | ICD-10-CM

## 2017-06-17 LAB — COMPREHENSIVE METABOLIC PANEL
ALK PHOS: 60 U/L (ref 39–117)
ALT: 13 U/L (ref 0–35)
AST: 13 U/L (ref 0–37)
Albumin: 4.2 g/dL (ref 3.5–5.2)
BUN: 20 mg/dL (ref 6–23)
CHLORIDE: 104 meq/L (ref 96–112)
CO2: 30 mEq/L (ref 19–32)
Calcium: 9.3 mg/dL (ref 8.4–10.5)
Creatinine, Ser: 0.98 mg/dL (ref 0.40–1.20)
GFR: 58.82 mL/min — AB (ref >60.00)
GLUCOSE: 87 mg/dL (ref 70–99)
POTASSIUM: 4.5 meq/L (ref 3.5–5.1)
Sodium: 142 mEq/L (ref 135–145)
TOTAL PROTEIN: 6.8 g/dL (ref 6.0–8.3)
Total Bilirubin: 0.4 mg/dL (ref 0.2–1.2)

## 2017-06-17 LAB — TSH: TSH: 4.22 u[IU]/mL (ref 0.35–4.50)

## 2017-06-17 LAB — HEMOGLOBIN A1C: HEMOGLOBIN A1C: 6.6 % — AB (ref 4.6–6.5)

## 2017-06-17 NOTE — Progress Notes (Signed)
Subjective:   Megan Bradley is a 77 y.o. female who presents for Medicare Annual (Subsequent) preventive examination.  Review of Systems:  No ROS.  Medicare Wellness Visit. Additional risk factors are reflected in the social history.  Cardiac Risk Factors include: advanced age (>9men, >61 women)     Objective:     Vitals: BP 110/72 (BP Location: Left Arm, Patient Position: Sitting, Cuff Size: Normal)   Pulse (!) 54   Temp 98.3 F (36.8 C) (Oral)   Resp 14   Ht 5' 5.2" (1.656 m)   Wt 150 lb 6.4 oz (68.2 kg)   SpO2 98%   BMI 24.87 kg/m   Body mass index is 24.87 kg/m.  Advanced Directives 06/17/2017 06/12/2017 04/03/2017 03/24/2017 05/27/2016 02/26/2016 04/18/2015  Does Patient Have a Medical Advance Directive? Yes Yes Yes Yes Yes No Yes  Type of Paramedic of [place];Living will [place];Living will [place];Living will [place];Living will [place];Living will - [place];Living will  Does patient want to make changes to medical advance directive? No - Patient declined No - Patient declined - - - - No - Patient declined  Copy of [employer] in Chart? No - copy requested No - copy requested No - copy requested No - copy requested No - copy requested - No - copy requested    Tobacco Social History   Tobacco Use  Smoking Status Never Smoker  Smokeless Tobacco Never Used     Counseling given: Not Answered   Clinical Intake:  Pre-visit preparation completed: Yes  Pain : No/denies pain     Nutritional Status: BMI of 19-24  Normal Diabetes: Yes(Followed by PCP)  How often do you need to have someone help you when you read instructions, pamphlets, or other written materials from your doctor or pharmacy?: 1 - Never  Interpreter Needed?: No     Past Medical History:  Diagnosis Date  . Anxiety   . Anxiety disorder   . Cervical  dysplasia    a. 2010 - high grade squamous intraepithelial lesion s/p excision.  . Chest pain    a. 2006 or 2007 [facility]): reportedly nl cors;  b. 09/2011 ETT: nl.  . GERD (gastroesophageal reflux disease)   . Herpes zoster   . Hypothyroidism   . Orthostatic hypotension   . Pre-diabetes   . RBBB   . Vertigo   . Wears dentures    partial lower   Past Surgical History:  Procedure Laterality Date  . CARDIAC CATHETERIZATION  2003   Dr.Callwood, R/L heart cath,  . COLONOSCOPY WITH PROPOFOL N/A 06/12/2017   Procedure: COLONOSCOPY WITH PROPOFOL;  Surgeon: Lucilla Lame, MD;  Location: [place];  Service: Endoscopy;  Laterality: N/A;  . ESOPHAGEAL MANOMETRY N/A 06/19/2016   Procedure: ESOPHAGEAL MANOMETRY (EM);  Surgeon: Lucilla Lame, MD;  Location: [facility] ENDOSCOPY;  Service: Endoscopy;  Laterality: N/A;  . ESOPHAGOGASTRODUODENOSCOPY (EGD) WITH PROPOFOL N/A 04/03/2017   Procedure: ESOPHAGOGASTRODUODENOSCOPY (EGD) WITH PROPOFOL;  Surgeon: Lucilla Lame, MD;  Location: [place];  Service: Endoscopy;  Laterality: N/A;  diabetic - diet controlled  . THYROIDECTOMY  05/15/15   [facility]   Family History  Problem Relation Age of Onset  . Heart attack Mother   . Alcohol abuse Mother   . Mental illness Mother        died in her 80's of alzheimer's Dementia  . Heart disease Mother  58  . Heart attack Father   . Heart disease Father 12       AMI - died @ 27.  . Colon cancer Paternal Grandmother 23   Social History   Socioeconomic History  . Marital status: Married    Spouse name: None  . Number of children: None  . Years of education: None  . Highest education level: None  Social Needs  . Financial resource strain: Not hard at all  . Food insecurity - worry: Never true  . Food insecurity - inability: Never true  . Transportation needs - medical: No  . Transportation needs - non-medical: No  Occupational History  . None  Tobacco Use  . Smoking status: Never Smoker    . Smokeless tobacco: Never Used  Substance and Sexual Activity  . Alcohol use: Yes    Comment: wine once a week  . Drug use: No  . Sexual activity: No  Other Topics Concern  . None  Social History Narrative   Lives in [place].  Active but doesn't exercise. Retired from [employer].    Outpatient Encounter Medications as of 06/17/2017  Medication Sig  . ALPRAZolam (XANAX) 0.5 MG tablet Take 1 tablet (0.5 mg total) by mouth at bedtime as needed for anxiety.  Marland Kitchen CARTIA XT 120 MG 24 hr capsule TAKE 1 CAPSULE BY MOUTH ONCE DAILY  . Cholecalciferol (VITAMIN D) 2000 UNITS CAPS Take 1 capsule by mouth daily.    Marland Kitchen ezetimibe (ZETIA) 10 MG tablet TAKE 1 TABLET BY MOUTH  DAILY  . furosemide (LASIX) 20 MG tablet Take 1 tablet (20 mg total) by mouth daily as needed. For fluid retention  . glucose blood test strip For One touch Verio Flex .  Use to check 3 times daily   e11.9  . levothyroxine (SYNTHROID, LEVOTHROID) 100 MCG tablet TAKE 1 TABLET BY MOUTH ONCE DAILY BEFORE BREAKFAST  . meclizine (ANTIVERT) 25 MG tablet TAKE ONE TABLET BY MOUTH THREE TIMES DAILY AS NEEDED FOR  VERTIGO  . nitroGLYCERIN (NITROSTAT) 0.4 MG SL tablet Place 1 tablet (0.4 mg total) under the tongue every 5 (five) minutes as needed for chest pain.  Marland Kitchen omeprazole (PRILOSEC) 40 MG capsule TAKE 1 CAPSULE BY MOUTH  DAILY  . ondansetron (ZOFRAN ODT) 4 MG disintegrating tablet Take 1 tablet (4 mg total) by mouth every 8 (eight) hours as needed for nausea or vomiting.  . sertraline (ZOLOFT) 100 MG tablet TAKE 1 AND 1/2 TABLETS BY  MOUTH DAILY   No facility-administered encounter medications on file as of 06/17/2017.     Activities of Daily Living In your present state of health, do you have any difficulty performing the following activities: 06/17/2017 06/12/2017  Hearing? N N  Vision? N N  Difficulty concentrating or making decisions? N N  Walking or climbing stairs? N N  Dressing or bathing? N N  Doing errands, shopping? N -  Preparing  Food and eating ? N -  Using the Toilet? N -  In the past six months, have you accidently leaked urine? N -  Do you have problems with loss of bowel control? N -  Managing your Medications? N -  Managing your Finances? N -  Housekeeping or managing your Housekeeping? N -  Some recent data might be hidden    Patient Care Team: Crecencio Mc, MD as PCP - General (Internal Medicine)    Assessment:   This is a routine wellness examination for [place]. The goal of the  wellness visit is to assist the patient how to close the gaps in care and create a preventative care plan for the patient.   The roster of all physicians providing medical care to patient is listed in the Snapshot section of the chart.  Taking calcium VIT D as appropriate/Osteoporosis risk reviewed.    Safety issues reviewed; Smoke and carbon monoxide detectors in the home. No firearms in the home.  Wears seatbelts when driving or riding with others. Patient does wear sunscreen or protective clothing when in direct sunlight. No violence in the home.  Depression- PHQ 2 &9 complete.  No signs/symptoms or verbal communication regarding little pleasure in doing things, feeling down, depressed or hopeless. No changes in sleeping, energy, eating, concentrating.  No thoughts of self harm or harm towards others.  Time spent on this topic is 10 minutes.   Patient is alert, normal appearance, oriented to person/place/and time. Correctly identified the president of the [place], recall of 3/3 words, and performing simple calculations. Displays appropriate judgement and can read correct time from watch face.   No new identified risk were noted.  No failures at ADL's or IADL's.    BMI- discussed the importance of a healthy diet, water intake and the benefits of aerobic exercise. She plans to increase her activity and water intake. Educational material provided.   24 hour diet recall: Low carb foods  Daily fluid intake: 2-3 cups of  caffeine, 2-4 cups of water  Dental- every 6 months.  Eye- Visual acuity not assessed per patient preference since they have regular follow up with the ophthalmologist.  Wears corrective lenses.  Sleep patterns- Sleeps 8 hours at night.  Wakes feeling rested. CPAP not in use.  Dexa Scan ordered; follow as directed.  Educational material provided.  Labs completed per patient request; CMP, A1C, TSH.  Patient Concerns: None at this time. Follow up with PCP as needed.  Exercise Activities and Dietary recommendations Current Exercise Habits: The patient does not participate in regular exercise at present  Goals    . Increase physical activity     Walk 1.5 miles for exercise    . Increase water intake     Stay hydrated       Fall Risk Fall Risk  06/17/2017 05/27/2016 04/18/2015 06/09/2014 06/07/2014  Falls in the past year? No Yes No No No  Number falls in past yr: - 1 - - -  Injury with Fall? - Yes - - -  Comment - Followed by PCP - - -  Risk Factor Category  - - - - -  Comment - - - - -  Follow up - Education provided;Falls prevention discussed - - -   Depression Screen PHQ 2/9 Scores 06/17/2017 05/27/2016 04/18/2015 06/09/2014  PHQ - 2 Score 0 0 0 0  PHQ- 9 Score 0 - - -     Cognitive Function MMSE - Mini Mental State Exam 04/18/2015  Orientation to time 5  Orientation to Place 5  Registration 3  Attention/ Calculation 5  Recall 3  Language- name 2 objects 2  Language- repeat 1  Language- follow 3 step command 3  Language- read & follow direction 1  Write a sentence 1  Copy design 1  Total score 30     6CIT Screen 06/17/2017 05/27/2016  What Year? 0 points 0 points  What month? 0 points 0 points  What time? 0 points 0 points  Count back from 20 0 points 0 points  Months in  reverse 0 points 0 points  Repeat phrase 0 points -  Total Score 0 -    Immunization History  Administered Date(s) Administered  . DTaP 12/15/2011  . Influenza Split 04/14/2011, 03/02/2012    . Influenza, High Dose Seasonal PF 02/18/2016, 03/23/2017  . Influenza,inj,Quad PF,6+ Mos 03/30/2013, 03/04/2014, 03/02/2015  . Influenza-Unspecified 12/20/2011, 03/28/2015  . Pneumococcal Conjugate-13 06/07/2014  . Pneumococcal Polysaccharide-23 06/07/2008  . Tdap 06/07/2012   Screening Tests Health Maintenance  Topic Date Due  . DEXA SCAN  01/29/2017  . OPHTHALMOLOGY EXAM  05/20/2017  . FOOT EXAM  05/27/2017  . URINE MICROALBUMIN  01/14/2018 (Originally 01/01/2017)  . HEMOGLOBIN A1C  08/27/2017  . MAMMOGRAM  01/07/2019  . TETANUS/TDAP  06/07/2022  . INFLUENZA VACCINE  Completed  . PNA vac Low Risk Adult  Completed  . COLONOSCOPY  Discontinued      Plan:    End of life planning; Advance aging; Advanced directives discussed. Copy of current HCPOA/Living Will requested.    I have personally reviewed and noted the following in the patient's chart:   . Medical and social history . Use of alcohol, tobacco or illicit drugs  . Current medications and supplements . Functional ability and status . Nutritional status . Physical activity . Advanced directives . List of other physicians . Hospitalizations, surgeries, and ER visits in previous 12 months . Vitals . Screenings to include cognitive, depression, and falls . Referrals and appointments  In addition, I have reviewed and discussed with patient certain preventive protocols, quality metrics, and best practice recommendations. A written personalized care plan for preventive services as well as general preventive health recommendations were provided to patient.     OBrien-Blaney, Rafiel Mecca L, LPN  12/12/2829   I have reviewed the above information and agree with above.   Deborra Medina, MD

## 2017-06-17 NOTE — Patient Instructions (Addendum)
  Megan Bradley , Thank you for taking time to come for your Medicare Wellness Visit. I appreciate your ongoing commitment to your health goals. Please review the following plan we discussed and let me know if I can assist you in the future.   Follow up with Dr. Derrel Nip as needed.    Bring a copy of your [place] and/or Living Will to be scanned into chart.  Have a great day!  These are the goals we discussed: Goals    . Increase physical activity     Walk 1.5 miles for exercise    . Increase water intake     Stay hydrated       This is a list of the screening recommended for you and due dates:  Health Maintenance  Topic Date Due  . DEXA scan (bone density measurement)  01/29/2017  . Eye exam for diabetics  05/20/2017  . Complete foot exam   05/27/2017  . Urine Protein Check  01/14/2018*  . Hemoglobin A1C  08/27/2017  . Mammogram  01/07/2019  . Tetanus Vaccine  06/07/2022  . Flu Shot  Completed  . Pneumonia vaccines  Completed  . Colon Cancer Screening  Discontinued  *Topic was postponed. The date shown is not the original due date.

## 2017-06-26 DIAGNOSIS — E119 Type 2 diabetes mellitus without complications: Secondary | ICD-10-CM | POA: Diagnosis not present

## 2017-06-26 DIAGNOSIS — Z9842 Cataract extraction status, left eye: Secondary | ICD-10-CM | POA: Diagnosis not present

## 2017-06-26 DIAGNOSIS — H1852 Epithelial (juvenile) corneal dystrophy: Secondary | ICD-10-CM | POA: Diagnosis not present

## 2017-06-26 DIAGNOSIS — H52223 Regular astigmatism, bilateral: Secondary | ICD-10-CM | POA: Diagnosis not present

## 2017-06-26 DIAGNOSIS — Z9841 Cataract extraction status, right eye: Secondary | ICD-10-CM | POA: Diagnosis not present

## 2017-07-09 DIAGNOSIS — D485 Neoplasm of uncertain behavior of skin: Secondary | ICD-10-CM | POA: Diagnosis not present

## 2017-07-09 DIAGNOSIS — L821 Other seborrheic keratosis: Secondary | ICD-10-CM | POA: Diagnosis not present

## 2017-07-09 DIAGNOSIS — L570 Actinic keratosis: Secondary | ICD-10-CM | POA: Diagnosis not present

## 2017-07-09 DIAGNOSIS — Z85828 Personal history of other malignant neoplasm of skin: Secondary | ICD-10-CM | POA: Diagnosis not present

## 2017-07-09 DIAGNOSIS — C44612 Basal cell carcinoma of skin of right upper limb, including shoulder: Secondary | ICD-10-CM | POA: Diagnosis not present

## 2017-07-14 ENCOUNTER — Encounter: Payer: Self-pay | Admitting: Gastroenterology

## 2017-07-15 ENCOUNTER — Encounter: Payer: Self-pay | Admitting: Gastroenterology

## 2017-07-24 ENCOUNTER — Other Ambulatory Visit: Payer: Self-pay

## 2017-07-27 ENCOUNTER — Encounter: Payer: Self-pay | Admitting: Gastroenterology

## 2017-07-28 ENCOUNTER — Other Ambulatory Visit: Payer: Self-pay

## 2017-07-28 MED ORDER — NIFEDIPINE 10 MG PO CAPS: 10.0000 mg | ORAL_CAPSULE | Freq: Three times a day (TID) | ORAL | 3 refills | Status: DC

## 2017-08-09 ENCOUNTER — Other Ambulatory Visit: Payer: Self-pay | Admitting: Internal Medicine

## 2017-08-13 ENCOUNTER — Other Ambulatory Visit: Payer: Self-pay | Admitting: *Deleted

## 2017-08-13 MED ORDER — LEVOTHYROXINE SODIUM 100 MCG PO TABS: 100.0000 ug | ORAL_TABLET | Freq: Every day | ORAL | 2 refills | Status: DC

## 2017-08-18 DIAGNOSIS — C44612 Basal cell carcinoma of skin of right upper limb, including shoulder: Secondary | ICD-10-CM | POA: Diagnosis not present

## 2017-08-18 DIAGNOSIS — C44519 Basal cell carcinoma of skin of other part of trunk: Secondary | ICD-10-CM | POA: Diagnosis not present

## 2017-08-18 DIAGNOSIS — L905 Scar conditions and fibrosis of skin: Secondary | ICD-10-CM | POA: Diagnosis not present

## 2017-09-25 ENCOUNTER — Other Ambulatory Visit: Payer: Self-pay | Admitting: Internal Medicine

## 2017-11-12 ENCOUNTER — Other Ambulatory Visit: Payer: Self-pay | Admitting: Internal Medicine

## 2017-11-26 DIAGNOSIS — L821 Other seborrheic keratosis: Secondary | ICD-10-CM | POA: Diagnosis not present

## 2017-11-26 DIAGNOSIS — Z08 Encounter for follow-up examination after completed treatment for malignant neoplasm: Secondary | ICD-10-CM | POA: Diagnosis not present

## 2017-11-26 DIAGNOSIS — L249 Irritant contact dermatitis, unspecified cause: Secondary | ICD-10-CM | POA: Diagnosis not present

## 2017-11-26 DIAGNOSIS — L570 Actinic keratosis: Secondary | ICD-10-CM | POA: Diagnosis not present

## 2017-11-26 DIAGNOSIS — Z85828 Personal history of other malignant neoplasm of skin: Secondary | ICD-10-CM | POA: Diagnosis not present

## 2017-12-30 ENCOUNTER — Ambulatory Visit: Payer: Medicare Other | Admitting: Internal Medicine

## 2017-12-30 ENCOUNTER — Encounter: Payer: Self-pay | Admitting: Internal Medicine

## 2017-12-30 ENCOUNTER — Ambulatory Visit (INDEPENDENT_AMBULATORY_CARE_PROVIDER_SITE_OTHER): Payer: Medicare Other

## 2017-12-30 VITALS — BP 122/70 | HR 58 | Temp 97.9°F | Resp 16 | Ht 64.75 in | Wt 155.2 lb

## 2017-12-30 DIAGNOSIS — R195 Other fecal abnormalities: Secondary | ICD-10-CM

## 2017-12-30 DIAGNOSIS — R5383 Other fatigue: Secondary | ICD-10-CM

## 2017-12-30 DIAGNOSIS — E1169 Type 2 diabetes mellitus with other specified complication: Secondary | ICD-10-CM | POA: Diagnosis not present

## 2017-12-30 DIAGNOSIS — Z0000 Encounter for general adult medical examination without abnormal findings: Secondary | ICD-10-CM | POA: Diagnosis not present

## 2017-12-30 DIAGNOSIS — K224 Dyskinesia of esophagus: Secondary | ICD-10-CM

## 2017-12-30 DIAGNOSIS — Z1231 Encounter for screening mammogram for malignant neoplasm of breast: Secondary | ICD-10-CM

## 2017-12-30 DIAGNOSIS — D126 Benign neoplasm of colon, unspecified: Secondary | ICD-10-CM | POA: Diagnosis not present

## 2017-12-30 DIAGNOSIS — M25561 Pain in right knee: Secondary | ICD-10-CM

## 2017-12-30 DIAGNOSIS — E119 Type 2 diabetes mellitus without complications: Secondary | ICD-10-CM

## 2017-12-30 DIAGNOSIS — G8929 Other chronic pain: Secondary | ICD-10-CM

## 2017-12-30 DIAGNOSIS — E538 Deficiency of other specified B group vitamins: Secondary | ICD-10-CM | POA: Diagnosis not present

## 2017-12-30 DIAGNOSIS — E032 Hypothyroidism due to medicaments and other exogenous substances: Secondary | ICD-10-CM

## 2017-12-30 DIAGNOSIS — E559 Vitamin D deficiency, unspecified: Secondary | ICD-10-CM | POA: Diagnosis not present

## 2017-12-30 DIAGNOSIS — Z1239 Encounter for other screening for malignant neoplasm of breast: Secondary | ICD-10-CM

## 2017-12-30 DIAGNOSIS — M1711 Unilateral primary osteoarthritis, right knee: Secondary | ICD-10-CM | POA: Diagnosis not present

## 2017-12-30 DIAGNOSIS — E785 Hyperlipidemia, unspecified: Secondary | ICD-10-CM | POA: Diagnosis not present

## 2017-12-30 MED ORDER — HYOSCYAMINE SULFATE 0.125 MG SL SUBL: 0.1250 mg | SUBLINGUAL_TABLET | SUBLINGUAL | 5 refills | Status: DC | PRN

## 2017-12-30 NOTE — Progress Notes (Signed)
Patient ID: Megan Bradley, female    DOB: June 08, 1941  Age: 77 y.o. MRN: [id number]  The patient is here for annual preventive  examination and management of other chronic and acute problems.  Mammogram  August 2018 at Dr Cousin's office Last 3 PAP smears were normal,  2018 last one by cousins  Colonoscopy Jan 2019 tubular adenomas  found (6) DEXA 2016: mild osteopenia  Sees [facility] for skin cancer. ,  Had biopsy leg lesion  Right leg,  Normal.   Right shoulder squamous cell CA  Using a steroid cream       The risk factors are reflected in the social history.  The roster of all physicians providing medical care to patient - is listed in the Snapshot section of the chart.  Activities of daily living:  The patient is 100% independent in all ADLs: dressing, toileting, feeding as well as independent mobility  Home safety : The patient has smoke detectors in the home. They wear seatbelts.  There are no firearms at home. There is no violence in the home.   There is no risks for hepatitis, STDs or HIV. There is no   history of blood transfusion. They have no travel history to infectious disease endemic areas of the world.  The patient has seen their dentist in the last six month. They have seen their eye doctor in the last year. They admit to slight hearing difficulty with regard to whispered voices and some television programs.  They have deferred audiologic testing in the last year.  They do not  have excessive sun exposure. Discussed the need for sun protection: hats, long sleeves and use of sunscreen if there is significant sun exposure.   Diet: the importance of a healthy diet is discussed. They do have a healthy diet.  The benefits of regular aerobic exercise were discussed. She walks 4 times per week ,  20 minutes.   Depression screen: there are no signs or vegative symptoms of depression- irritability, change in appetite, anhedonia, sadness/tearfullness.  Cognitive assessment: the patient  manages all their financial and personal affairs and is actively engaged. They could relate day,date,year and events; recalled 2/3 objects at 3 minutes; performed clock-face test normally.  The following portions of the patient's history were reviewed and updated as appropriate: allergies, current medications, past family history, past medical history,  past surgical history, past social history  and problem list.  Visual acuity was not assessed per patient preference since she has regular follow up with her ophthalmologist. Hearing and body mass index were assessed and reviewed.   During the course of the visit the patient was educated and counseled about appropriate screening and preventive services including : fall prevention , diabetes screening, nutrition counseling, colorectal cancer screening, and recommended immunizations.    CC: The primary encounter diagnosis was Breast cancer screening. Diagnoses of Chronic pain of right knee, Diabetes mellitus without complication ([place]), Hyperlipidemia associated with type 2 diabetes mellitus ([place]), Fatigue, unspecified type, Vitamin D deficiency, B12 deficiency, Nutcracker esophagus, Iatrogenic hypothyroidism, Positive colorectal cancer screening using Cologuard test, Tubular adenoma of colon, and Encounter for preventive health examination were also pertinent to this visit.   Depression:  Taking zoloft 150 mg daily no recent dose change .  Has No energy,  No enthusiasm.  But will play word games on computer for hours.   Has no desire for social gatherings. Wasn't to increase dose before trying other medications.  Wants to consider talk therapy. Names given  Nutcracker esophagus:  Diagnosed but not added to problem list by GI.  Recurrent episodes are severe and frightening despite adherence to daily regimen of cartia XT  , using PPI bid. Unclear what else she should be taking . Prior trial of SL NTG did not help .   Sublingual nifedipine was suggested but  patient did not receive the rx from [facility] (based on review of e mails from Dr Allen Norris in February ) because of insurance nonpayment .  Suggest a  trial of levsin SL prn (given to patient today).    History Megan Bradley has a past medical history of Anxiety, Anxiety disorder, Cervical dysplasia, Chest pain, GERD (gastroesophageal reflux disease), Herpes zoster, Hypothyroidism, Orthostatic hypotension, Pre-diabetes, RBBB, Vertigo, and Wears dentures.   She has a past surgical history that includes Cardiac catheterization (2003); Thyroidectomy (05/15/15); Esophageal manometry (N/A, 06/19/2016); Esophagogastroduodenoscopy (egd) with propofol (N/A, 04/03/2017); and Colonoscopy with propofol (N/A, 06/12/2017).   Her family history includes Alcohol abuse in her mother; Colon cancer (age of onset: 52) in her paternal grandmother; Heart attack in her father and mother; Heart disease (age of onset: 20) in her father; Heart disease (age of onset: 42) in her mother; Mental illness in her mother.She reports that she has never smoked. She has never used smokeless tobacco. She reports that she drinks alcohol. She reports that she does not use drugs.  Outpatient Medications Prior to Visit  Medication Sig Dispense Refill  . ALPRAZolam (XANAX) 0.5 MG tablet Take 1 tablet (0.5 mg total) by mouth at bedtime as needed for anxiety. 30 tablet 5  . CARTIA XT 120 MG 24 hr capsule TAKE 1 CAPSULE BY MOUTH ONCE DAILY 90 capsule 1  . Cholecalciferol (VITAMIN D) 2000 UNITS CAPS Take 1 capsule by mouth daily.      Marland Kitchen ezetimibe (ZETIA) 10 MG tablet TAKE 1 TABLET BY MOUTH  DAILY 90 tablet 2  . furosemide (LASIX) 20 MG tablet Take 1 tablet (20 mg total) by mouth daily as needed. For fluid retention 30 tablet 3  . glucose blood test strip For One touch Verio Flex .  Use to check 3 times daily   e11.9 100 each 0  . meclizine (ANTIVERT) 25 MG tablet TAKE 1 TABLET BY MOUTH THREE TIMES DAILY AS NEEDED FOR  VERTIGO 30 tablet 1  . nitroGLYCERIN  (NITROSTAT) 0.4 MG SL tablet Place 1 tablet (0.4 mg total) under the tongue every 5 (five) minutes as needed for chest pain. 50 tablet 3  . omeprazole (PRILOSEC) 40 MG capsule TAKE 1 CAPSULE BY MOUTH  DAILY 90 capsule 2  . ondansetron (ZOFRAN ODT) 4 MG disintegrating tablet Take 1 tablet (4 mg total) by mouth every 8 (eight) hours as needed for nausea or vomiting. 20 tablet 0  . levothyroxine (SYNTHROID, LEVOTHROID) 100 MCG tablet Take 1 tablet (100 mcg total) by mouth daily before breakfast. 90 tablet 2  . sertraline (ZOLOFT) 100 MG tablet TAKE 1 AND 1/2 TABLETS BY  MOUTH DAILY 135 tablet 1  . NIFEdipine (PROCARDIA) 10 MG capsule Take 1 capsule (10 mg total) by mouth 3 (three) times daily. (Patient not taking: Reported on 12/30/2017) 90 capsule 3   No facility-administered medications prior to visit.     Review of Systems   Patient denies headache, fevers, malaise, unintentional weight loss, skin rash, eye pain, sinus congestion and sinus pain, sore throat, dysphagia,  hemoptysis , cough, dyspnea, wheezing, chest pain, palpitations, orthopnea, edema, abdominal pain, nausea, melena, diarrhea, constipation, flank pain,  dysuria, hematuria, urinary  Frequency, nocturia, numbness, tingling, seizures,  Focal weakness, Loss of consciousness,  Tremor, insomnia,  and suicidal ideation.      Objective:  BP 122/70 (BP Location: Left Arm, Patient Position: Sitting, Cuff Size: Normal)   Pulse (!) 58   Temp 97.9 F (36.6 C) (Oral)   Resp 16   Ht 5' 4.75" (1.645 m)   Wt 155 lb 3.2 oz (70.4 kg)   SpO2 96%   BMI 26.03 kg/m   Physical Exam   General appearance: alert, cooperative and appears stated age Ears: normal TM's and external ear canals both ears Throat: lips, mucosa, and tongue normal; teeth and gums normal Neck: no adenopathy, no carotid bruit, supple, symmetrical, trachea midline and thyroid not enlarged, symmetric, no tenderness/mass/nodules Back: symmetric, no curvature. ROM normal. No  CVA tenderness. Lungs: clear to auscultation bilaterally Heart: regular rate and rhythm, S1, S2 normal, no murmur, click, rub or gallop Abdomen: soft, non-tender; bowel sounds normal; no masses,  no organomegaly Pulses: 2+ and symmetric Skin: Skin color, texture, turgor normal. No rashes or lesions Lymph nodes: Cervical, supraclavicular, and axillary nodes normal.    Assessment & Plan:   Problem List Items Addressed This Visit    Tubular adenoma of colon    5 yr follow up colonsocopy due in 2024      RESOLVED: Positive colorectal cancer screening using Cologuard test    Colonoscopy was performed in jan 2019 and 6 tubular adenomas were found 5 yr follow up needed in 2024      Nutcracker esophagus    Diagnosed by Dr Allen Norris.  EGD without stricture Jan 2019.  Continue Cartia XT and bid PPI.  SL nifedipine prescribed but insurance would not cover it.   trial of hysocyamine for prn use       Iatrogenic hypothyroidism    She has been noticing thinning of hair without overt loss. Her TSH is elevated. . Will increase dose of levothyroxine to 112 mcg today    Lab Results  Component Value Date   TSH 5.05 (H) 12/30/2017         Relevant Medications   levothyroxine (SYNTHROID, LEVOTHROID) 112 MCG tablet   Encounter for preventive health examination    Annual comprehensive preventive exam was done as well as an evaluation and management of chronic conditions .  During the course of the visit the patient was educated and counseled about appropriate screening and preventive services including :  diabetes screening, lipid analysis with projected  10 year  risk for CAD , nutrition counseling, breast, cervical and colorectal cancer screening, and recommended immunizations.  Printed recommendations for health maintenance screenings was given.      Diabetes mellitus without complication ([place])    Remains well-controlled on low GI diet.  . Patient has no microalbuminuria. Patient is tolerating  statin therapy for CAD risk reduction and on ACE/ARB for renal protection and hypertension . Lab Results  Component Value Date   HGBA1C 6.9 (H) 12/30/2017   Lab Results  Component Value Date   MICROALBUR 0.8 12/30/2017         Relevant Orders   Hemoglobin A1c (Completed)   Comprehensive metabolic panel (Completed)   Microalbumin / creatinine urine ratio (Completed)   B12 deficiency    She has been taking an oral supplement. Repeat level normal   Lab Results  Component Value Date   VITAMINB12 492 12/30/2017         Relevant Orders   Vitamin B12 (  Completed)    Other Visit Diagnoses    Breast cancer screening    -  Primary   Relevant Orders   MM DIGITAL SCREENING BILATERAL   Chronic pain of right knee       Relevant Orders   DG Knee Complete 4 Views Right (Completed)   Hyperlipidemia associated with type 2 diabetes mellitus ([place])       Relevant Orders   Lipid panel (Completed)   Fatigue, unspecified type       Relevant Orders   TSH (Completed)   Vitamin D deficiency       Relevant Orders   VITAMIN D 25 Hydroxy (Vit-D Deficiency, Fractures) (Completed)      I have discontinued Megan Bradley's NIFEdipine. I have also changed her levothyroxine. Additionally, I am having her start on hyoscyamine. Lastly, I am having her maintain her Vitamin D, glucose blood, nitroGLYCERIN, furosemide, omeprazole, ezetimibe, ALPRAZolam, ondansetron, CARTIA XT, and meclizine.  Meds ordered this encounter  Medications  . hyoscyamine (LEVSIN SL) 0.125 MG SL tablet    Sig: Place 1 tablet (0.125 mg total) under the tongue every 4 (four) hours as needed.    Dispense:  30 tablet    Refill:  5  . levothyroxine (SYNTHROID, LEVOTHROID) 112 MCG tablet    Sig: Take 1 tablet (112 mcg total) by mouth daily before breakfast.    Dispense:  90 tablet    Refill:  1    Medications Discontinued During This Encounter  Medication Reason  . NIFEdipine (PROCARDIA) 10 MG capsule Patient has not taken  in last 30 days  . levothyroxine (SYNTHROID, LEVOTHROID) 100 MCG tablet     Follow-up: Return in about 6 months (around 07/02/2018).   Crecencio Mc, MD

## 2017-12-30 NOTE — Patient Instructions (Addendum)
Mammogram has been ordered at [facility] [place] of sublingual hyoscyamine for next spasm of  esophagus   Here are the names of several well respected female therapists   Lennon Alstrom    [phone] [place]   [phone] [place] [phone]  Achilles Dunk  [phone]  [place] Maintenance for Postmenopausal Women Menopause is a normal process in which your reproductive ability comes to an end. This process happens gradually over a span of months to years, usually between the ages of 78 and 75. Menopause is complete when you have missed 12 consecutive menstrual periods. It is important to talk with your health care provider about some of the most common conditions that affect postmenopausal women, such as heart disease, cancer, and bone loss (osteoporosis). Adopting a healthy lifestyle and getting preventive care can help to promote your health and wellness. Those actions can also lower your chances of developing some of these common conditions. What should I know about menopause? During menopause, you may experience a number of symptoms, such as:  Moderate-to-severe hot flashes.  Night sweats.  Decrease in sex drive.  Mood swings.  Headaches.  Tiredness.  Irritability.  Memory problems.  Insomnia.  Choosing to treat or not to treat menopausal changes is an individual decision that you make with your health care provider. What should I know about hormone replacement therapy and supplements? Hormone therapy products are effective for treating symptoms that are associated with menopause, such as hot flashes and night sweats. Hormone replacement carries certain risks, especially as you become older. If you are thinking about using estrogen or estrogen with progestin treatments, discuss the benefits and risks with your health care provider. What should I know about heart disease and stroke? Heart disease,  heart attack, and stroke become more likely as you age. This may be due, in part, to the hormonal changes that your body experiences during menopause. These can affect how your body processes dietary fats, triglycerides, and cholesterol. Heart attack and stroke are both medical emergencies. There are many things that you can do to help prevent heart disease and stroke:  Have your blood pressure checked at least every 1-2 years. High blood pressure causes heart disease and increases the risk of stroke.  If you are 56-53 years old, ask your health care provider if you should take aspirin to prevent a heart attack or a stroke.  Do not use any tobacco products, including cigarettes, chewing tobacco, or electronic cigarettes. If you need help quitting, ask your health care provider.  It is important to eat a healthy diet and maintain a healthy weight. ? Be sure to include plenty of vegetables, fruits, low-fat dairy products, and lean protein. ? Avoid eating foods that are high in solid fats, added sugars, or salt (sodium).  Get regular exercise. This is one of the most important things that you can do for your health. ? Try to exercise for at least 150 minutes each week. The type of exercise that you do should increase your heart rate and make you sweat. This is known as moderate-intensity exercise. ? Try to do strengthening exercises at least twice each week. Do these in addition to the moderate-intensity exercise.  Know your numbers.Ask your health care provider to check your cholesterol and your blood glucose. Continue to have your blood tested as directed by your health care provider.  What should I know about cancer screening? There are several  types of cancer. Take the following steps to reduce your risk and to catch any cancer development as early as possible. Breast Cancer  Practice breast self-awareness. ? This means understanding how your breasts normally appear and feel. ? It also  means doing regular breast self-exams. Let your health care provider know about any changes, no matter how small.  If you are 43 or older, have a clinician do a breast exam (clinical breast exam or CBE) every year. Depending on your age, family history, and medical history, it may be recommended that you also have a yearly breast X-ray (mammogram).  If you have a family history of breast cancer, talk with your health care provider about genetic screening.  If you are at high risk for breast cancer, talk with your health care provider about having an MRI and a mammogram every year.  Breast cancer (BRCA) gene test is recommended for women who have family members with BRCA-related cancers. Results of the assessment will determine the need for genetic counseling and BRCA1 and for BRCA2 testing. BRCA-related cancers include these types: ? Breast. This occurs in males or females. ? Ovarian. ? Tubal. This may also be called fallopian tube cancer. ? Cancer of the abdominal or pelvic lining (peritoneal cancer). ? Prostate. ? Pancreatic.  Cervical, Uterine, and Ovarian Cancer Your health care provider may recommend that you be screened regularly for cancer of the pelvic organs. These include your ovaries, uterus, and vagina. This screening involves a pelvic exam, which includes checking for microscopic changes to the surface of your cervix (Pap test).  For women ages 21-65, health care providers may recommend a pelvic exam and a Pap test every three years. For women ages 52-65, they may recommend the Pap test and pelvic exam, combined with testing for human papilloma virus (HPV), every five years. Some types of HPV increase your risk of cervical cancer. Testing for HPV may also be done on women of any age who have unclear Pap test results.  Other health care providers may not recommend any screening for nonpregnant women who are considered low risk for pelvic cancer and have no symptoms. Ask your health  care provider if a screening pelvic exam is right for you.  If you have had past treatment for cervical cancer or a condition that could lead to cancer, you need Pap tests and screening for cancer for at least 20 years after your treatment. If Pap tests have been discontinued for you, your risk factors (such as having a new sexual partner) need to be reassessed to determine if you should start having screenings again. Some women have medical problems that increase the chance of getting cervical cancer. In these cases, your health care provider may recommend that you have screening and Pap tests more often.  If you have a family history of uterine cancer or ovarian cancer, talk with your health care provider about genetic screening.  If you have vaginal bleeding after reaching menopause, tell your health care provider.  There are currently no reliable tests available to screen for ovarian cancer.  Lung Cancer Lung cancer screening is recommended for adults 64-90 years old who are at high risk for lung cancer because of a history of smoking. A yearly low-dose CT scan of the lungs is recommended if you:  Currently smoke.  Have a history of at least 30 pack-years of smoking and you currently smoke or have quit within the past 15 years. A pack-year is smoking an average of one  pack of cigarettes per day for one year.  Yearly screening should:  Continue until it has been 15 years since you quit.  Stop if you develop a health problem that would prevent you from having lung cancer treatment.  Colorectal Cancer  This type of cancer can be detected and can often be prevented.  Routine colorectal cancer screening usually begins at age 46 and continues through age 42.  If you have risk factors for colon cancer, your health care provider may recommend that you be screened at an earlier age.  If you have a family history of colorectal cancer, talk with your health care provider about genetic  screening.  Your health care provider may also recommend using home test kits to check for hidden blood in your stool.  A small camera at the end of a tube can be used to examine your colon directly (sigmoidoscopy or colonoscopy). This is done to check for the earliest forms of colorectal cancer.  Direct examination of the colon should be repeated every 5-10 years until age 59. However, if early forms of precancerous polyps or small growths are found or if you have a family history or genetic risk for colorectal cancer, you may need to be screened more often.  Skin Cancer  Check your skin from head to toe regularly.  Monitor any moles. Be sure to tell your health care provider: ? About any new moles or changes in moles, especially if there is a change in a mole's shape or color. ? If you have a mole that is larger than the size of a pencil eraser.  If any of your family members has a history of skin cancer, especially at a young age, talk with your health care provider about genetic screening.  Always use sunscreen. Apply sunscreen liberally and repeatedly throughout the day.  Whenever you are outside, protect yourself by wearing long sleeves, pants, a wide-brimmed hat, and sunglasses.  What should I know about osteoporosis? Osteoporosis is a condition in which bone destruction happens more quickly than new bone creation. After menopause, you may be at an increased risk for osteoporosis. To help prevent osteoporosis or the bone fractures that can happen because of osteoporosis, the following is recommended:  If you are 46-84 years old, get at least 1,000 mg of calcium and at least 600 mg of vitamin D per day.  If you are older than age 77 but younger than age 65, get at least 1,200 mg of calcium and at least 600 mg of vitamin D per day.  If you are older than age 33, get at least 1,200 mg of calcium and at least 800 mg of vitamin D per day.  Smoking and excessive alcohol intake  increase the risk of osteoporosis. Eat foods that are rich in calcium and vitamin D, and do weight-bearing exercises several times each week as directed by your health care provider. What should I know about how menopause affects my mental health? Depression may occur at any age, but it is more common as you become older. Common symptoms of depression include:  Low or sad mood.  Changes in sleep patterns.  Changes in appetite or eating patterns.  Feeling an overall lack of motivation or enjoyment of activities that you previously enjoyed.  Frequent crying spells.  Talk with your health care provider if you think that you are experiencing depression. What should I know about immunizations? It is important that you get and maintain your immunizations. These include:  Tetanus, diphtheria, and pertussis (Tdap) booster vaccine.  Influenza every year before the flu season begins.  Pneumonia vaccine.  Shingles vaccine.  Your health care provider may also recommend other immunizations. This information is not intended to replace advice given to you by your health care provider. Make sure you discuss any questions you have with your health care provider. Document Released: 07/18/2005 Document Revised: 12/14/2015 Document Reviewed: 02/27/2015 Elsevier Interactive Patient Education  2018 [employer].

## 2017-12-31 LAB — COMPREHENSIVE METABOLIC PANEL WITH GFR
ALT: 17 U/L (ref 0–35)
AST: 15 U/L (ref 0–37)
Albumin: 4.4 g/dL (ref 3.5–5.2)
Alkaline Phosphatase: 70 U/L (ref 39–117)
BUN: 23 mg/dL (ref 6–23)
CO2: 29 meq/L (ref 19–32)
Calcium: 10 mg/dL (ref 8.4–10.5)
Chloride: 101 meq/L (ref 96–112)
Creatinine, Ser: 1.03 mg/dL (ref 0.40–1.20)
GFR: 55.45 mL/min — ABNORMAL LOW
Glucose, Bld: 98 mg/dL (ref 70–99)
Potassium: 4 meq/L (ref 3.5–5.1)
Sodium: 141 meq/L (ref 135–145)
Total Bilirubin: 0.5 mg/dL (ref 0.2–1.2)
Total Protein: 7.3 g/dL (ref 6.0–8.3)

## 2017-12-31 LAB — MICROALBUMIN / CREATININE URINE RATIO
Creatinine,U: 155.9 mg/dL
MICROALB/CREAT RATIO: 0.5 mg/g (ref 0.0–30.0)
Microalb, Ur: 0.8 mg/dL (ref 0.0–1.9)

## 2017-12-31 LAB — LIPID PANEL
CHOL/HDL RATIO: 2
Cholesterol: 211 mg/dL — ABNORMAL HIGH (ref 0–200)
HDL: 86 mg/dL (ref >39.00)
LDL Cholesterol: 114 mg/dL — ABNORMAL HIGH (ref 0–99)
NONHDL: 124.77
Triglycerides: 53 mg/dL (ref 0.0–149.0)
VLDL: 10.6 mg/dL (ref 0.0–40.0)

## 2017-12-31 LAB — VITAMIN B12: Vitamin B-12: 492 pg/mL (ref 211–911)

## 2017-12-31 LAB — VITAMIN D 25 HYDROXY (VIT D DEFICIENCY, FRACTURES): VITD: 37.21 ng/mL (ref 30.00–100.00)

## 2017-12-31 LAB — HEMOGLOBIN A1C: HEMOGLOBIN A1C: 6.9 % — AB (ref 4.6–6.5)

## 2017-12-31 LAB — TSH: TSH: 5.05 u[IU]/mL — ABNORMAL HIGH (ref 0.35–4.50)

## 2018-01-01 ENCOUNTER — Other Ambulatory Visit: Payer: Self-pay | Admitting: Internal Medicine

## 2018-01-01 DIAGNOSIS — Z780 Asymptomatic menopausal state: Secondary | ICD-10-CM

## 2018-01-01 DIAGNOSIS — K224 Dyskinesia of esophagus: Secondary | ICD-10-CM | POA: Insufficient documentation

## 2018-01-01 MED ORDER — SERTRALINE HCL 100 MG PO TABS: 200.0000 mg | ORAL_TABLET | Freq: Every day | ORAL | 1 refills | Status: DC

## 2018-01-01 MED ORDER — LEVOTHYROXINE SODIUM 112 MCG PO TABS: 112.0000 ug | ORAL_TABLET | Freq: Every day | ORAL | 1 refills | Status: DC

## 2018-01-01 NOTE — Assessment & Plan Note (Signed)
5 yr follow up colonsocopy due in 2024

## 2018-01-01 NOTE — Assessment & Plan Note (Signed)
She has been taking an oral supplement. Repeat level normal   Lab Results  Component Value Date   VITAMINB12 492 12/30/2017

## 2018-01-01 NOTE — Assessment & Plan Note (Addendum)
Annual comprehensive preventive exam was done as well as an evaluation and management of chronic conditions .  During the course of the visit the patient was educated and counseled about appropriate screening and preventive services including :  diabetes screening, lipid analysis with projected  10 year  risk for CAD , nutrition counseling, breast, cervical and colorectal cancer screening, and recommended immunizations.  Printed recommendations for health maintenance screenings was given 

## 2018-01-01 NOTE — Assessment & Plan Note (Signed)
Remains well-controlled on low GI diet.  . Patient has no microalbuminuria. Patient is tolerating statin therapy for CAD risk reduction and on ACE/ARB for renal protection and hypertension . Lab Results  Component Value Date   HGBA1C 6.9 (H) 12/30/2017   Lab Results  Component Value Date   MICROALBUR 0.8 12/30/2017

## 2018-01-01 NOTE — Assessment & Plan Note (Signed)
She has been noticing thinning of hair without overt loss. Her TSH is elevated. . Will increase dose of levothyroxine to 112 mcg today    Lab Results  Component Value Date   TSH 5.05 (H) 12/30/2017

## 2018-01-01 NOTE — Assessment & Plan Note (Signed)
Diagnosed by Dr Allen Norris.  EGD without stricture Jan 2019.  Continue Cartia XT and bid PPI.  SL nifedipine prescribed but insurance would not cover it.   trial of hysocyamine for prn use

## 2018-01-01 NOTE — Assessment & Plan Note (Signed)
Colonoscopy was performed in jan 2019 and 6 tubular adenomas were found 5 yr follow up needed in 2024

## 2018-01-06 ENCOUNTER — Other Ambulatory Visit: Payer: Self-pay | Admitting: Internal Medicine

## 2018-01-13 ENCOUNTER — Other Ambulatory Visit: Payer: Self-pay | Admitting: Internal Medicine

## 2018-01-13 DIAGNOSIS — E2839 Other primary ovarian failure: Secondary | ICD-10-CM

## 2018-01-20 ENCOUNTER — Other Ambulatory Visit: Payer: Self-pay | Admitting: Internal Medicine

## 2018-01-20 DIAGNOSIS — Z1231 Encounter for screening mammogram for malignant neoplasm of breast: Secondary | ICD-10-CM

## 2018-02-03 ENCOUNTER — Other Ambulatory Visit: Payer: Self-pay | Admitting: Internal Medicine

## 2018-02-16 ENCOUNTER — Ambulatory Visit: Payer: Medicare Other

## 2018-03-11 ENCOUNTER — Ambulatory Visit
Admission: RE | Admit: 2018-03-11 | Discharge: 2018-03-11 | Disposition: A | Discharge status: Home / Self Care | Payer: Medicare Other | Source: Ambulatory Visit | Attending: Internal Medicine | Admitting: Internal Medicine

## 2018-03-11 ENCOUNTER — Other Ambulatory Visit: Payer: Self-pay | Admitting: Internal Medicine

## 2018-03-11 DIAGNOSIS — Z9882 Breast implant status: Secondary | ICD-10-CM | POA: Insufficient documentation

## 2018-03-11 DIAGNOSIS — M810 Age-related osteoporosis without current pathological fracture: Secondary | ICD-10-CM | POA: Diagnosis not present

## 2018-03-11 DIAGNOSIS — Z780 Asymptomatic menopausal state: Secondary | ICD-10-CM | POA: Diagnosis not present

## 2018-03-11 DIAGNOSIS — Z1231 Encounter for screening mammogram for malignant neoplasm of breast: Secondary | ICD-10-CM

## 2018-03-11 DIAGNOSIS — E2839 Other primary ovarian failure: Secondary | ICD-10-CM

## 2018-03-11 DIAGNOSIS — M85832 Other specified disorders of bone density and structure, left forearm: Secondary | ICD-10-CM | POA: Diagnosis not present

## 2018-03-11 DIAGNOSIS — T8543XA Leakage of breast prosthesis and implant, initial encounter: Secondary | ICD-10-CM

## 2018-03-16 ENCOUNTER — Other Ambulatory Visit: Payer: Self-pay | Admitting: Internal Medicine

## 2018-03-29 ENCOUNTER — Ambulatory Visit (INDEPENDENT_AMBULATORY_CARE_PROVIDER_SITE_OTHER): Payer: Medicare Other

## 2018-03-29 DIAGNOSIS — Z23 Encounter for immunization: Secondary | ICD-10-CM | POA: Diagnosis not present

## 2018-03-30 ENCOUNTER — Telehealth: Payer: Self-pay | Admitting: Internal Medicine

## 2018-03-30 ENCOUNTER — Other Ambulatory Visit: Payer: Self-pay

## 2018-03-30 NOTE — Telephone Encounter (Signed)
Requested medication (s) are due for refill today: Yes  Requested medication (s) are on the active medication list: Yes  Last refill:  05/2017  Future visit scheduled: Yes  Notes to clinic:  Unable to refill per protocol, narcotic.     Requested Prescriptions  Pending Prescriptions Disp Refills   ALPRAZolam (XANAX) 0.5 MG tablet 30 tablet 5    Sig: Take 1 tablet (0.5 mg total) by mouth at bedtime as needed for anxiety.     Not Delegated - Psychiatry:  Anxiolytics/Hypnotics Failed - 03/30/2018  9:49 AM      Failed - This refill cannot be delegated      Failed - Urine Drug Screen completed in last 360 days.      Passed - Valid encounter within last 6 months    Recent Outpatient Visits          3 months ago Encounter for preventive health examination   [place] Crecencio Mc, MD   11 months ago Other dysphagia   [facility] [place] Crecencio Mc, MD   1 year ago History of pernicious anemia   [facility] [place] Crecencio Mc, MD   1 year ago Iatrogenic hypothyroidism   [facility] [place] Crecencio Mc, MD   2 years ago Iatrogenic hypothyroidism   [facility] [place] Crecencio Mc, MD      Future Appointments            In 2 months O'Brien-Blaney, Bryson Corona, LPN [place], [place]   In 3 months Derrel Nip, Aris Everts, MD [facility], [place]

## 2018-03-30 NOTE — Telephone Encounter (Signed)
Copied from [place] [id number]. Topic: Quick Communication - Rx Refill/Question >> Mar 30, 2018  9:43 AM Reyne Dumas L wrote: Medication: ALPRAZolam Duanne Moron) 0.5 MG tablet  Has the patient contacted their pharmacy? Yes - states the office has not responded (Agent: If no, request that the patient contact the pharmacy for the refill.) (Agent: If yes, when and what did the pharmacy advise?)  Preferred Pharmacy (with phone number or street name): [place], [place] [phone] (Phone) [phone] (Fax)  Agent: Please be advised that RX refills may take up to 3 business days. We ask that you follow-up with your pharmacy.

## 2018-03-30 NOTE — Telephone Encounter (Signed)
Refilled: 05/14/2017 Last OV: 12/30/2017 Next OV: 07/02/2018

## 2018-03-30 NOTE — Telephone Encounter (Signed)
Wrong office, sending to the right one.  °

## 2018-03-31 MED ORDER — ALPRAZOLAM 0.5 MG PO TABS: 0.5000 mg | ORAL_TABLET | Freq: Two times a day (BID) | ORAL | 1 refills | Status: DC | PRN

## 2018-03-31 NOTE — Telephone Encounter (Signed)
rx was faxed and a mychart message was sent to pt.

## 2018-03-31 NOTE — Telephone Encounter (Signed)
No rx attached.  If it was the alprazolam,  I have refilled it

## 2018-04-01 DIAGNOSIS — C44319 Basal cell carcinoma of skin of other parts of face: Secondary | ICD-10-CM | POA: Diagnosis not present

## 2018-04-01 DIAGNOSIS — D485 Neoplasm of uncertain behavior of skin: Secondary | ICD-10-CM | POA: Diagnosis not present

## 2018-04-01 DIAGNOSIS — Z85828 Personal history of other malignant neoplasm of skin: Secondary | ICD-10-CM | POA: Diagnosis not present

## 2018-04-01 DIAGNOSIS — L281 Prurigo nodularis: Secondary | ICD-10-CM | POA: Diagnosis not present

## 2018-04-01 DIAGNOSIS — L570 Actinic keratosis: Secondary | ICD-10-CM | POA: Diagnosis not present

## 2018-04-01 DIAGNOSIS — Z08 Encounter for follow-up examination after completed treatment for malignant neoplasm: Secondary | ICD-10-CM | POA: Diagnosis not present

## 2018-04-01 NOTE — Telephone Encounter (Signed)
Yes this was for the alprazolam. I apologize.

## 2018-04-02 ENCOUNTER — Other Ambulatory Visit: Payer: Self-pay | Admitting: Internal Medicine

## 2018-04-05 ENCOUNTER — Telehealth: Payer: Self-pay

## 2018-04-05 NOTE — Telephone Encounter (Signed)
spoke with pt and scheduled her for Friday 04/09/2018 @ 4:30. Pt is aware of appt date and time.

## 2018-04-05 NOTE — Telephone Encounter (Signed)
Copied from [place] [id number]. Topic: General - Other >> Apr 05, 2018 10:39 AM Yvette Rack wrote: Reason for CRM: Pt states she needs to have a Cortisone shot done next week but there is no availability. Pt requests call back. Cb# [phone]

## 2018-04-05 NOTE — Telephone Encounter (Signed)
YES,  YOU CAN

## 2018-04-05 NOTE — Telephone Encounter (Signed)
Pt stated that she was told at her last appt that she should call and get an appt scheduled for a cortisone injection if her knee kept bothering her. The pt is wanting to know if she can get the done asap. Would it be okay to schedule in a same day spot?

## 2018-04-09 ENCOUNTER — Ambulatory Visit (INDEPENDENT_AMBULATORY_CARE_PROVIDER_SITE_OTHER): Payer: Medicare Other | Admitting: Internal Medicine

## 2018-04-09 ENCOUNTER — Encounter: Payer: Self-pay | Admitting: Internal Medicine

## 2018-04-09 VITALS — BP 114/68 | HR 75 | Temp 97.6°F | Resp 16 | Ht 64.75 in | Wt 154.6 lb

## 2018-04-09 DIAGNOSIS — M25562 Pain in left knee: Secondary | ICD-10-CM

## 2018-04-09 DIAGNOSIS — M25561 Pain in right knee: Secondary | ICD-10-CM | POA: Diagnosis not present

## 2018-04-09 DIAGNOSIS — G8929 Other chronic pain: Secondary | ICD-10-CM | POA: Diagnosis not present

## 2018-04-09 NOTE — Patient Instructions (Signed)
I injected your right knee today with xylocaine and Kenalog ( a steroid) Knee Injection, Care After Refer to this sheet in the next few weeks. These instructions provide you with information about caring for yourself after your procedure. Your health care provider may also give you more specific instructions. Your treatment has been planned according to current medical practices, but problems sometimes occur. Call your health care provider if you have any problems or questions after your procedure. What can I expect after the procedure? After the procedure, it is common to have:  Soreness.  Warmth.  Swelling.  You may have more pain, swelling, and warmth than you did before the injection. This reaction may last for about one day. Follow these instructions at home: Bathing  If you were given a bandage (dressing), keep it dry until your health care provider says it can be removed. Ask your health care provider when you can start showering or taking a bath. Managing pain, stiffness, and swelling  If directed, apply ice to the injection area: ? Put ice in a plastic bag. ? Place a towel between your skin and the bag. ? Leave the ice on for 20 minutes, 2-3 times per day.  Do not apply heat to your knee.  Raise the injection area above the level of your heart while you are sitting or lying down. Activity  Avoid strenuous activities for as long as directed by your health care provider. Ask your health care provider when you can return to your normal activities. General instructions  Take medicines only as directed by your health care provider.  Do not take aspirin or other over-the-counter medicines unless your health care provider says you can.  Check your injection site every day for signs of infection. Watch for: ? Redness, swelling, or pain. ? Fluid, blood, or pus.  Follow your health care provider's instructions about dressing changes and removal. Contact a health care provider  if:  You have symptoms at your injection site that last longer than two days after your procedure.  You have redness, swelling, or pain in your injection area.  You have fluid, blood, or pus coming from your injection site.  You have warmth in your injection area.  You have a fever.  Your pain is not controlled with medicine. Get help right away if:  Your knee turns very red.  Your knee becomes very swollen.  Your knee pain is severe. This information is not intended to replace advice given to you by your health care provider. Make sure you discuss any questions you have with your health care provider. Document Released: 06/16/2014 Document Revised: 01/30/2016 Document Reviewed: 04/05/2014 Elsevier Interactive Patient Education  [employer].

## 2018-04-09 NOTE — Progress Notes (Addendum)
Subjective:  Patient ID: Megan Bradley, female    DOB: [birth year]  Age: 77 y.o. MRN: [id number]  CC: The primary encounter diagnosis was Chronic pain of left knee. A diagnosis of Chronic pain of right knee was also pertinent to this visit.  HPI Megan Bradley presents for a steroid injection In the right knee . She has known DJD bu prior films done in July    Outpatient Medications Prior to Visit  Medication Sig Dispense Refill  . ALPRAZolam (XANAX) 0.5 MG tablet Take 1 tablet (0.5 mg total) by mouth 2 (two) times daily as needed for anxiety. 60 tablet 1  . CARTIA XT 120 MG 24 hr capsule TAKE 1 CAPSULE BY MOUTH ONCE DAILY 90 capsule 1  . Cholecalciferol (VITAMIN D) 2000 UNITS CAPS Take 1 capsule by mouth daily.      Marland Kitchen ezetimibe (ZETIA) 10 MG tablet TAKE 1 TABLET BY MOUTH  DAILY 90 tablet 1  . furosemide (LASIX) 20 MG tablet Take 1 tablet (20 mg total) by mouth daily as needed. For fluid retention 30 tablet 3  . glucose blood test strip For One touch Verio Flex .  Use to check 3 times daily   e11.9 100 each 0  . hyoscyamine (LEVSIN SL) 0.125 MG SL tablet Place 1 tablet (0.125 mg total) under the tongue every 4 (four) hours as needed. 30 tablet 5  . levothyroxine (SYNTHROID, LEVOTHROID) 112 MCG tablet Take 1 tablet (112 mcg total) by mouth daily before breakfast. 90 tablet 1  . meclizine (ANTIVERT) 25 MG tablet TAKE 1 TABLET BY MOUTH THREE TIMES DAILY AS NEEDED FOR  VERTIGO 30 tablet 1  . nitroGLYCERIN (NITROSTAT) 0.4 MG SL tablet DISSOLVE ONE TABLET UNDER THE TONGUE EVERY 5 MINUTES AS NEEDED FOR CHEST PAIN.  DO NOT EXCEED A TOTAL OF 3 DOSES IN 15 MINUTES 50 tablet 3  . omeprazole (PRILOSEC) 40 MG capsule TAKE 1 CAPSULE BY MOUTH  DAILY 90 capsule 2  . ondansetron (ZOFRAN ODT) 4 MG disintegrating tablet Take 1 tablet (4 mg total) by mouth every 8 (eight) hours as needed for nausea or vomiting. 20 tablet 0  . sertraline (ZOLOFT) 100 MG tablet Take 2 tablets (200 mg total) by mouth daily. 180 tablet  1   No facility-administered medications prior to visit.     Review of Systems;  Patient denies headache, fevers, malaise, unintentional weight loss, skin rash, eye pain, sinus congestion and sinus pain, sore throat, dysphagia,  hemoptysis , cough, dyspnea, wheezing, chest pain, palpitations, orthopnea, edema, abdominal pain, nausea, melena, diarrhea, constipation, flank pain, dysuria, hematuria, urinary  Frequency, nocturia, numbness, tingling, seizures,  Focal weakness, Loss of consciousness,  Tremor, insomnia, depression, anxiety, and suicidal ideation.      Objective:  BP 114/68 (BP Location: Left Arm, Patient Position: Sitting, Cuff Size: Normal)   Pulse 75   Temp 97.6 F (36.4 C) (Oral)   Resp 16   Ht 5' 4.75" (1.645 m)   Wt 154 lb 9.6 oz (70.1 kg)   SpO2 94%   BMI 25.93 kg/m   BP Readings from Last 3 Encounters:  04/09/18 114/68  12/30/17 122/70  06/17/17 110/72    Wt Readings from Last 3 Encounters:  04/09/18 154 lb 9.6 oz (70.1 kg)  12/30/17 155 lb 3.2 oz (70.4 kg)  06/17/17 150 lb 6.4 oz (68.2 kg)    General appearance: alert, cooperative and appears stated age Back: symmetric, no curvature. ROM normal. No CVA tenderness. Lungs: clear to  auscultation bilaterally Heart: regular rate and rhythm, S1, S2 normal, no murmur, click, rub or gallop Abdomen: soft, non-tender; bowel sounds normal; no masses,  no organomegaly Pulses: 2+ and symmetric Skin: knee without skin change sor effusion.  color, texture, turgor normal. No rashes or lesions Lymph nodes: Cervical, supraclavicular, and axillary nodes normal.  Lab Results  Component Value Date   HGBA1C 6.9 (H) 12/30/2017   HGBA1C 6.6 (H) 06/17/2017   HGBA1C 6.7 (H) 02/27/2017    Lab Results  Component Value Date   CREATININE 1.03 12/30/2017   CREATININE 0.98 06/17/2017   CREATININE 0.95 03/24/2017    Lab Results  Component Value Date   WBC 9.1 03/24/2017   HGB 12.2 03/24/2017   HCT 36.2 03/24/2017   PLT  156 03/24/2017   GLUCOSE 98 12/30/2017   CHOL 211 (H) 12/30/2017   TRIG 53.0 12/30/2017   HDL 86.00 12/30/2017   LDLDIRECT 93.0 01/02/2016   LDLCALC 114 (H) 12/30/2017   ALT 17 12/30/2017   AST 15 12/30/2017   NA 141 12/30/2017   K 4.0 12/30/2017   CL 101 12/30/2017   CREATININE 1.03 12/30/2017   BUN 23 12/30/2017   CO2 29 12/30/2017   TSH 5.05 (H) 12/30/2017   INR 0.9 12/05/2011   HGBA1C 6.9 (H) 12/30/2017   MICROALBUR 0.8 12/30/2017    Dg Bone Density  Result Date: 03/11/2018 EXAM: DUAL X-RAY ABSORPTIOMETRY (DXA) FOR BONE MINERAL DENSITY IMPRESSION: Referring Physician:  Deborra Medina, MD Your patient completed a BMD test using Lunar IDXA DXA system ( analysis version: 16 ) manufactured by [employer]. Technologist: KT PATIENT: Name: Megan Bradley Patient ID: [id number] Birth Date: [birth year] Height: 65.0 in. Sex: Female Measured: 03/11/2018 Weight: 158.3 lbs. Indications: Advanced Age, Caucasian, Depression, Estrogen Deficient, Family Hist. (Parent hip fracture), History of Fracture (Adult) (V15.51), History of Osteopenia, Hypothyroid, Omeprazole, Postmenopausal, Synthroid, Zoloft Fractures: Rib Treatments: Vitamin D (E933.5) ASSESSMENT: The BMD measured at Femur Neck Right is 0.680 g/cm2 with a T-score of -2.6. This patient is considered osteoporotic according to [facility]) criteria. There has been no statistically significant change in BMD of left hip since prior exam dated 01/30/2015. The scan quality is good. Lumbar spine was not utilized due to advanced degenerative changes. DXA exam performed on prior Hologic device measured only unilateral hip (Total Mean was not measured) Therefore current or prior Total Mean cannot be compared. Site Region Measured Date Measured Age YA BMD Significant CHANGE T-score DualFemur Neck Right 03/11/2018 75.6 -2.6 0.680 g/cm2 DualFemur Total Mean 03/11/2018 75.6 -2.0 0.758 g/cm2 Left Forearm Radius 33% 03/11/2018 75.6 -2.3 0.688 g/cm2  World Health Organization [facility]) criteria for post-menopausal, Caucasian Women: Normal       T-score at or above -1 SD Osteopenia   T-score between -1 and -2.5 SD Osteoporosis T-score at or below -2.5 SD RECOMMENDATION: 1. All patients should optimize calcium and vitamin D intake. 2. Consider FDA approved medical therapies in postmenopausal women and men aged 37 years and older, based on the following: a. A hip or vertebral (clinical or morphometric) fracture b. T- score < or = -2.5 at the femoral neck or spine after appropriate evaluation to exclude secondary causes c. Low bone mass (T-score between -1.0 and -2.5 at the femoral neck or spine) and a 10 year probability of a hip fracture > or = 3% or a 10 year probability of a major osteoporosis-related fracture > or = 20% based on the [place]-adapted WHO algorithm d. Clinician judgment and/or patient  preferences may indicate treatment for people with 10-year fracture probabilities above or below these levels FOLLOW-UP: People with diagnosed cases of osteoporosis or at high risk for fracture should have regular bone mineral density tests. For patients eligible for Medicare, routine testing is allowed once every 2 years. The testing frequency can be increased to one year for patients who have rapidly progressing disease, those who are receiving or discontinuing medical therapy to restore bone mass, or have additional risk factors. I have reviewed this report and agree with the above findings. [facility] Electronically Signed   By: Rolm Baptise M.D.   On: 03/11/2018 10:50   Mm 3d Screen Breast W/implant Bilateral  Result Date: 03/11/2018 CLINICAL DATA:  Screening. EXAM: DIGITAL SCREENING BILATERAL MAMMOGRAM WITH IMPLANTS, CAD AND TOMO The patient has retropectoral implants. Standard and implant displaced views were performed. COMPARISON:  Previous exam(s). ACR Breast Density Category b: There are scattered areas of fibroglandular density. FINDINGS: Apparent  implant ruptures with high attenuation material adjacent to the implants, right greater than left. There are no findings suspicious for malignancy. Images were processed with CAD. IMPRESSION: No mammographic evidence of malignancy. A result letter of this screening mammogram will be mailed directly to the patient. Apparent implant ruptures as above. RECOMMENDATION: Screening mammogram in one year. (Code:[id number]) Also, an MRI could better evaluate the patient's suspected implant ruptures. BI-RADS CATEGORY  2: Benign. Electronically Signed   By: Dorise Bullion III M.D   On: 03/11/2018 13:55    Assessment & Plan:   Problem List Items Addressed This Visit    Knee pain, right     Informed consent for joint injection obtained.  Area was cleaned with betadine.  Right medial patella  bursa was injected with 40 mg of Kenalog and 4 ml of Xylocaine .  Procedure was tolerated well and knee was feeling  less painful by the time she left the office.  She was advised to rest the knee for 48 hours , apply ice packs every 6 hours for 15 minutes, advised to call if she develops signs of infection         Other Visit Diagnoses    Chronic pain of left knee    -  Primary   Relevant Orders   DG Knee Complete 4 Views Left      I am having Eriyah B. Lape maintain her Vitamin D, glucose blood, furosemide, ondansetron, meclizine, hyoscyamine, sertraline, levothyroxine, nitroGLYCERIN, ezetimibe, CARTIA XT, ALPRAZolam, and omeprazole.  No orders of the defined types were placed in this encounter.   There are no discontinued medications.  Follow-up: No follow-ups on file.   Crecencio Mc, MD

## 2018-04-11 DIAGNOSIS — M25561 Pain in right knee: Secondary | ICD-10-CM | POA: Insufficient documentation

## 2018-04-11 DIAGNOSIS — G8929 Other chronic pain: Secondary | ICD-10-CM | POA: Insufficient documentation

## 2018-04-11 NOTE — Assessment & Plan Note (Addendum)
Informed consent for joint injection obtained.  Area was cleaned with betadine.  Right medial patella  bursa was injected with 40 mg of Kenalog and 4 ml of Xylocaine .  Procedure was tolerated well and knee was feeling  less painful by the time she left the office.  She was advised to rest the knee for 48 hours , apply ice packs every 6 hours for 15 minutes, advised to call if she develops signs of infection

## 2018-05-04 MED ORDER — LIDOCAINE-EPINEPHRINE 1 %-1:100000 IJ SOLN
4.0000 mL | Freq: Once | INTRAMUSCULAR | Status: AC
  Administered 2018-04-09: 4 mL

## 2018-05-04 MED ORDER — TRIAMCINOLONE ACETONIDE 40 MG/ML IJ SUSP
40.0000 mg | Freq: Once | INTRAMUSCULAR | Status: AC
  Administered 2018-04-09: 40 mg via INTRAMUSCULAR

## 2018-05-04 NOTE — Addendum Note (Signed)
Addended by: Adair Laundry on: 05/04/2018 04:55 PM   Modules accepted: Orders

## 2018-05-12 ENCOUNTER — Telehealth: Payer: Self-pay

## 2018-05-12 DIAGNOSIS — Z978 Presence of other specified devices: Secondary | ICD-10-CM

## 2018-05-12 DIAGNOSIS — T8543XA Leakage of breast prosthesis and implant, initial encounter: Secondary | ICD-10-CM

## 2018-05-12 NOTE — Telephone Encounter (Signed)
Copied from [place] [id number]. Topic: General - Other >> May 12, 2018  3:00 PM Yvette Rack wrote: Reason for CRM: Stanton Kidney from [employer] [phone]  calling to speak with someone in referrals about a pt

## 2018-05-13 NOTE — Addendum Note (Signed)
Addended by: Adair Laundry on: 05/13/2018 11:47 AM   Modules accepted: Orders

## 2018-05-13 NOTE — Telephone Encounter (Signed)
Thank you :)

## 2018-05-13 NOTE — Telephone Encounter (Signed)
MRI has been has been changed to MRI breast without contrast per [facility].

## 2018-05-14 ENCOUNTER — Other Ambulatory Visit: Payer: Self-pay

## 2018-05-14 MED ORDER — LEVOTHYROXINE SODIUM 112 MCG PO TABS: 112.0000 ug | ORAL_TABLET | Freq: Every day | ORAL | 0 refills | Status: DC

## 2018-05-14 NOTE — Telephone Encounter (Signed)
Order has been faxed to [place].

## 2018-05-20 ENCOUNTER — Telehealth: Payer: Self-pay

## 2018-05-20 ENCOUNTER — Ambulatory Visit: Payer: Medicare Other | Admitting: Internal Medicine

## 2018-05-20 VITALS — BP 120/78 | HR 72 | Temp 98.4°F | Resp 16 | Wt 149.8 lb

## 2018-05-20 DIAGNOSIS — H6503 Acute serous otitis media, bilateral: Secondary | ICD-10-CM | POA: Diagnosis not present

## 2018-05-20 DIAGNOSIS — J40 Bronchitis, not specified as acute or chronic: Secondary | ICD-10-CM | POA: Diagnosis not present

## 2018-05-20 MED ORDER — BENZONATATE 200 MG PO CAPS: 200.0000 mg | ORAL_CAPSULE | Freq: Three times a day (TID) | ORAL | 1 refills | Status: DC | PRN

## 2018-05-20 MED ORDER — PREDNISONE 10 MG PO TABS: ORAL_TABLET | ORAL | 0 refills | Status: DC

## 2018-05-20 MED ORDER — METHYLPREDNISOLONE ACETATE 40 MG/ML IJ SUSP
40.0000 mg | Freq: Once | INTRAMUSCULAR | Status: AC
  Administered 2018-05-20: 40 mg via INTRAMUSCULAR

## 2018-05-20 MED ORDER — METHYLPREDNISOLONE ACETATE 40 MG/ML IJ SUSP: 40.0000 mg | Freq: Once | INTRAMUSCULAR | Status: DC

## 2018-05-20 NOTE — Patient Instructions (Addendum)
Prednisone taper  For 6 days for the inflammation and cough   benzonatate capsules  every 8 hours for the  cough  Benadryl 25 mg one hour before bedtime   Sudafed  PE  For the congestion.  10 mg every 6 hours  Until 4pm   If the ear pain worsens,  You will need antibiotcs  If cough is not controlled you will need tussionex    Return for fasting labs in January

## 2018-05-20 NOTE — Progress Notes (Signed)
Acute Office Visit  Subjective:    Patient ID: Megan Bradley, female    DOB: 08/02/1940, 77 y.o.   MRN: [id number]  Chief Complaint  Patient presents with  . Acute Visit    upper respiratory    HPI Patient is in today for persistent non productive cough and bilateral ear pain . symptoms present for approx 2 weeks .  Husband had an illness first.  Cough is very productive sounding at night.  Sore throat improved.  No rhinitis  But eyes burning left eye particularly  No discharge.  No headaches, fevers   Foot exam done .    Lab Results  Component Value Date   HGBA1C 6.9 (H) 12/30/2017       Past Medical History:  Diagnosis Date  . Anxiety   . Anxiety disorder   . Cervical dysplasia    a. 2010 - high grade squamous intraepithelial lesion s/p excision.  . Chest pain    a. 2006 or 2007 Cath [facility]): reportedly nl cors;  b. 09/2011 ETT: nl.  . GERD (gastroesophageal reflux disease)   . Herpes zoster   . Hypothyroidism   . Orthostatic hypotension   . Pre-diabetes   . RBBB   . Vertigo   . Wears dentures    partial lower    Past Surgical History:  Procedure Laterality Date  . AUGMENTATION MAMMAPLASTY Bilateral   . BREAST EXCISIONAL BIOPSY Left   . CARDIAC CATHETERIZATION  2003   Dr.Callwood, R/L heart cath,  . COLONOSCOPY WITH PROPOFOL N/A 06/12/2017   Procedure: COLONOSCOPY WITH PROPOFOL;  Surgeon: Lucilla Lame, MD;  Location: [facility];  Service: Endoscopy;  Laterality: N/A;  . ESOPHAGEAL MANOMETRY N/A 06/19/2016   Procedure: ESOPHAGEAL MANOMETRY (EM);  Surgeon: Lucilla Lame, MD;  Location: [facility];  Service: Endoscopy;  Laterality: N/A;  . ESOPHAGOGASTRODUODENOSCOPY (EGD) WITH PROPOFOL N/A 04/03/2017   Procedure: ESOPHAGOGASTRODUODENOSCOPY (EGD) WITH PROPOFOL;  Surgeon: Lucilla Lame, MD;  Location: [facility];  Service: Endoscopy;  Laterality: N/A;  diabetic - diet controlled  . THYROIDECTOMY  05/15/15   [facility]    Family History  Problem  Relation Age of Onset  . Heart attack Mother   . Alcohol abuse Mother   . Mental illness Mother        died in her 74's of alzheimer's Dementia  . Heart disease Mother 21  . Heart attack Father   . Heart disease Father 42       AMI - died @ 36.  . Colon cancer Paternal Grandmother 10    Social History   Socioeconomic History  . Marital status: Married    Spouse name: Not on file  . Number of children: Not on file  . Years of education: Not on file  . Highest education level: Not on file  Occupational History  . Not on file  Social Needs  . Financial resource strain: Not hard at all  . Food insecurity:    Worry: Never true    Inability: Never true  . Transportation needs:    Medical: No    Non-medical: No  Tobacco Use  . Smoking status: Never Smoker  . Smokeless tobacco: Never Used  Substance and Sexual Activity  . Alcohol use: Yes    Comment: wine once a week  . Drug use: No  . Sexual activity: Never  Lifestyle  . Physical activity:    Days per week: Not on file    Minutes per session: Not on  file  . Stress: Not on file  Relationships  . Social connections:    Talks on phone: Not on file    Gets together: Not on file    Attends religious service: Not on file    Active member of club or organization: Not on file    Attends meetings of clubs or organizations: Not on file    Relationship status: Not on file  . Intimate partner violence:    Fear of current or ex partner: No    Emotionally abused: No    Physically abused: No    Forced sexual activity: No  Other Topics Concern  . Not on file  Social History Narrative   Lives in [place].  Active but doesn't exercise. Retired from [employer].    Outpatient Medications Prior to Visit  Medication Sig Dispense Refill  . ALPRAZolam (XANAX) 0.5 MG tablet Take 1 tablet (0.5 mg total) by mouth 2 (two) times daily as needed for anxiety. 60 tablet 1  . CARTIA XT 120 MG 24 hr capsule TAKE 1 CAPSULE BY MOUTH ONCE DAILY 90  capsule 1  . Cholecalciferol (VITAMIN D) 2000 UNITS CAPS Take 1 capsule by mouth daily.      Marland Kitchen ezetimibe (ZETIA) 10 MG tablet TAKE 1 TABLET BY MOUTH  DAILY 90 tablet 1  . furosemide (LASIX) 20 MG tablet Take 1 tablet (20 mg total) by mouth daily as needed. For fluid retention 30 tablet 3  . glucose blood test strip For One touch Verio Flex .  Use to check 3 times daily   e11.9 100 each 0  . hyoscyamine (LEVSIN SL) 0.125 MG SL tablet Place 1 tablet (0.125 mg total) under the tongue every 4 (four) hours as needed. 30 tablet 5  . levothyroxine (SYNTHROID, LEVOTHROID) 112 MCG tablet Take 1 tablet (112 mcg total) by mouth daily before breakfast. 90 tablet 0  . nitroGLYCERIN (NITROSTAT) 0.4 MG SL tablet DISSOLVE ONE TABLET UNDER THE TONGUE EVERY 5 MINUTES AS NEEDED FOR CHEST PAIN.  DO NOT EXCEED A TOTAL OF 3 DOSES IN 15 MINUTES 50 tablet 3  . omeprazole (PRILOSEC) 40 MG capsule TAKE 1 CAPSULE BY MOUTH  DAILY 90 capsule 2  . ondansetron (ZOFRAN ODT) 4 MG disintegrating tablet Take 1 tablet (4 mg total) by mouth every 8 (eight) hours as needed for nausea or vomiting. 20 tablet 0  . sertraline (ZOLOFT) 100 MG tablet Take 2 tablets (200 mg total) by mouth daily. 180 tablet 1  . meclizine (ANTIVERT) 25 MG tablet TAKE 1 TABLET BY MOUTH THREE TIMES DAILY AS NEEDED FOR  VERTIGO 30 tablet 1   No facility-administered medications prior to visit.     Allergies  Allergen Reactions  . Atorvastatin Other (See Comments)    Muscle cramps and weakness  . Codeine Nausea And Vomiting  . Fentanyl Nausea And Vomiting  . Lipitor [Atorvastatin Calcium]     Muscle cramps and weakness  . Statins     Muscle cramps and weakness  . Tetracyclines & Related Hives    ROS     Objective:    Physical Exam  BP 120/78 (BP Location: Left Arm, Patient Position: Sitting, Cuff Size: Normal)   Pulse 72   Temp 98.4 F (36.9 C) (Oral)   Resp 16   Wt 149 lb 12.8 oz (67.9 kg)   SpO2 95%   BMI 25.12 kg/m  Wt Readings  from Last 3 Encounters:  05/20/18 149 lb 12.8 oz (67.9 kg)  04/09/18  154 lb 9.6 oz (70.1 kg)  12/30/17 155 lb 3.2 oz (70.4 kg)    Health Maintenance Due  Topic Date Due  . OPHTHALMOLOGY EXAM  05/20/2017  . FOOT EXAM  05/27/2017    There are no preventive care reminders to display for this patient.   Lab Results  Component Value Date   TSH 5.05 (H) 12/30/2017   Lab Results  Component Value Date   WBC 9.1 03/24/2017   HGB 12.2 03/24/2017   HCT 36.2 03/24/2017   MCV 90.1 03/24/2017   PLT 156 03/24/2017   Lab Results  Component Value Date   NA 141 12/30/2017   K 4.0 12/30/2017   CO2 29 12/30/2017   GLUCOSE 98 12/30/2017   BUN 23 12/30/2017   CREATININE 1.03 12/30/2017   BILITOT 0.5 12/30/2017   ALKPHOS 70 12/30/2017   AST 15 12/30/2017   ALT 17 12/30/2017   PROT 7.3 12/30/2017   ALBUMIN 4.4 12/30/2017   CALCIUM 10.0 12/30/2017   ANIONGAP 8 03/24/2017   GFR 55.45 (L) 12/30/2017   Lab Results  Component Value Date   CHOL 211 (H) 12/30/2017   Lab Results  Component Value Date   HDL 86.00 12/30/2017   Lab Results  Component Value Date   LDLCALC 114 (H) 12/30/2017   Lab Results  Component Value Date   TRIG 53.0 12/30/2017   Lab Results  Component Value Date   CHOLHDL 2 12/30/2017   Lab Results  Component Value Date   HGBA1C 6.9 (H) 12/30/2017       Assessment & Plan:   Problem List Items Addressed This Visit    Otitis media    Secondary to prolonged congestion from viral URI        Other Visit Diagnoses    Bronchitis    -  Primary   Relevant Medications   methylPREDNISolone acetate (DEPO-MEDROL) injection 40 mg (Completed)       Meds ordered this encounter  Medications  . predniSONE (DELTASONE) 10 MG tablet    Sig: 6 tablets on Day 1 , then reduce by 1 tablet daily until gone    Dispense:  21 tablet    Refill:  0  . benzonatate (TESSALON) 200 MG capsule    Sig: Take 1 capsule (200 mg total) by mouth 3 (three) times daily as needed  for cough.    Dispense:  60 capsule    Refill:  1  . DISCONTD: methylPREDNISolone acetate (DEPO-MEDROL) injection 40 mg  . methylPREDNISolone acetate (DEPO-MEDROL) injection 40 mg     Crecencio Mc, MD

## 2018-05-20 NOTE — Telephone Encounter (Signed)
Copied from [place] [phone]. Topic: General - Other >> May 20, 2018  2:06 PM Megan Bradley wrote: Reason for CRM: Patient was seen today but forgot her after visit summary- Patient's husband Richardson Landry is going to pick up later today.   **After visit printed & put in front office to pick up.**

## 2018-05-22 ENCOUNTER — Encounter: Payer: Self-pay | Admitting: Internal Medicine

## 2018-05-22 DIAGNOSIS — H6690 Otitis media, unspecified, unspecified ear: Secondary | ICD-10-CM | POA: Insufficient documentation

## 2018-05-22 NOTE — Assessment & Plan Note (Signed)
Secondary to prolonged congestion from viral URI

## 2018-05-23 ENCOUNTER — Ambulatory Visit
Admission: RE | Admit: 2018-05-23 | Discharge: 2018-05-23 | Disposition: A | Discharge status: Home / Self Care | Payer: Medicare Other | Source: Ambulatory Visit | Attending: Internal Medicine | Admitting: Internal Medicine

## 2018-05-23 DIAGNOSIS — T8543XA Leakage of breast prosthesis and implant, initial encounter: Secondary | ICD-10-CM

## 2018-05-23 DIAGNOSIS — Z9882 Breast implant status: Secondary | ICD-10-CM | POA: Diagnosis not present

## 2018-05-24 ENCOUNTER — Other Ambulatory Visit: Payer: Self-pay | Admitting: Internal Medicine

## 2018-05-24 DIAGNOSIS — C44319 Basal cell carcinoma of skin of other parts of face: Secondary | ICD-10-CM | POA: Diagnosis not present

## 2018-05-24 DIAGNOSIS — T8543XA Leakage of breast prosthesis and implant, initial encounter: Secondary | ICD-10-CM

## 2018-06-18 ENCOUNTER — Ambulatory Visit (INDEPENDENT_AMBULATORY_CARE_PROVIDER_SITE_OTHER): Payer: Medicare Other

## 2018-06-18 VITALS — BP 110/68 | HR 70 | Temp 98.0°F | Resp 14 | Ht 65.0 in | Wt 148.0 lb

## 2018-06-18 DIAGNOSIS — Z0000 Encounter for general adult medical examination without abnormal findings: Secondary | ICD-10-CM | POA: Diagnosis not present

## 2018-06-18 NOTE — Patient Instructions (Addendum)
  Megan Bradley , Thank you for taking time to come for your Medicare Wellness Visit. I appreciate your ongoing commitment to your health goals. Please review the following plan we discussed and let me know if I can assist you in the future.   Schedule eye exam.   Follow up as needed.    Bring a copy of your [place] and/or Living Will to be scanned into chart.  Have a great day!  These are the goals we discussed: Goals      Patient Stated   . Healthy Lifestyle (pt-stated)     Stay hydrated  Increase physical activity       This is a list of the screening recommended for you and due dates:  Health Maintenance  Topic Date Due  . Eye exam for diabetics  05/20/2017  . Hemoglobin A1C  07/02/2018  . Urine Protein Check  12/31/2018  . Complete foot exam   05/21/2019  . DEXA scan (bone density measurement)  03/11/2020  . Tetanus Vaccine  06/07/2022  . Flu Shot  Completed  . Pneumonia vaccines  Completed  . Colon Cancer Screening  Discontinued

## 2018-06-18 NOTE — Progress Notes (Addendum)
Subjective:   Megan Bradley is a 78 y.o. female who presents for Medicare Annual (Subsequent) preventive examination.  Review of Systems:  No ROS.  Medicare Wellness Visit. Additional risk factors are reflected in the social history. Cardiac Risk Factors include: advanced age (>41men, >15 women);diabetes mellitus     Objective:     Vitals: BP 110/68 (BP Location: Left Arm, Patient Position: Sitting, Cuff Size: Normal)   Pulse 70   Temp 98 F (36.7 C) (Oral)   Resp 14   Ht 5\' 5"  (1.651 m)   Wt 148 lb (67.1 kg)   SpO2 98%   BMI 24.63 kg/m   Body mass index is 24.63 kg/m.  Advanced Directives 06/18/2018 06/17/2017 06/12/2017 04/03/2017 03/24/2017 05/27/2016 02/26/2016  Does Patient Have a Medical Advance Directive? Yes Yes Yes Yes Yes Yes No  Type of Paramedic of [place];Living will [place];Living will [place];Living will [place];Living will [place];Living will [place];Living will -  Does patient want to make changes to medical advance directive? No - Patient declined No - Patient declined No - Patient declined - - - -  Copy of [place] in Chart? No - copy requested No - copy requested No - copy requested No - copy requested No - copy requested No - copy requested -    Tobacco Social History   Tobacco Use  Smoking Status Never Smoker  Smokeless Tobacco Never Used     Counseling given: Not Answered   Clinical Intake:  Pre-visit preparation completed: Yes  Pain : No/denies pain     Diabetes: Yes(Followed by pcp)  How often do you need to have someone help you when you read instructions, pamphlets, or other written materials from your doctor or pharmacy?: 1 - Never  Interpreter Needed?: No     Past Medical History:  Diagnosis Date  . Anxiety   . Anxiety disorder   . Cervical dysplasia    a. 2010 - high grade  squamous intraepithelial lesion s/p excision.  . Chest pain    a. 2006 or 2007 Cath [facility]): reportedly nl cors;  b. 09/2011 ETT: nl.  . GERD (gastroesophageal reflux disease)   . Herpes zoster   . Hypothyroidism   . Orthostatic hypotension   . Pre-diabetes   . RBBB   . Vertigo   . Wears dentures    partial lower   Past Surgical History:  Procedure Laterality Date  . AUGMENTATION MAMMAPLASTY Bilateral   . BREAST EXCISIONAL BIOPSY Left   . CARDIAC CATHETERIZATION  2003   Dr.Callwood, R/L heart cath,  . COLONOSCOPY WITH PROPOFOL N/A 06/12/2017   Procedure: COLONOSCOPY WITH PROPOFOL;  Surgeon: Lucilla Lame, MD;  Location: [facility];  Service: Endoscopy;  Laterality: N/A;  . ESOPHAGEAL MANOMETRY N/A 06/19/2016   Procedure: ESOPHAGEAL MANOMETRY (EM);  Surgeon: Lucilla Lame, MD;  Location: [facility];  Service: Endoscopy;  Laterality: N/A;  . ESOPHAGOGASTRODUODENOSCOPY (EGD) WITH PROPOFOL N/A 04/03/2017   Procedure: ESOPHAGOGASTRODUODENOSCOPY (EGD) WITH PROPOFOL;  Surgeon: Lucilla Lame, MD;  Location: [facility];  Service: Endoscopy;  Laterality: N/A;  diabetic - diet controlled  . THYROIDECTOMY  05/15/15   [facility]   Family History  Problem Relation Age of Onset  . Heart attack Mother   . Alcohol abuse Mother   . Mental illness Mother        died in her 16's of alzheimer's Dementia  .  Heart disease Mother 45  . Heart attack Father   . Heart disease Father 47       AMI - died @ 55.  . Colon cancer Paternal Grandmother 67   Social History   Socioeconomic History  . Marital status: Married    Spouse name: Not on file  . Number of children: Not on file  . Years of education: Not on file  . Highest education level: Not on file  Occupational History  . Not on file  Social Needs  . Financial resource strain: Not hard at all  . Food insecurity:    Worry: Never true    Inability: Never true  . Transportation needs:    Medical: No    Non-medical: No  Tobacco  Use  . Smoking status: Never Smoker  . Smokeless tobacco: Never Used  Substance and Sexual Activity  . Alcohol use: Yes    Comment: wine once a week  . Drug use: No  . Sexual activity: Never  Lifestyle  . Physical activity:    Days per week: 0 days    Minutes per session: 0 min  . Stress: Not at all  Relationships  . Social connections:    Talks on phone: Not on file    Gets together: Not on file    Attends religious service: Not on file    Active member of club or organization: Not on file    Attends meetings of clubs or organizations: Not on file    Relationship status: Not on file  Other Topics Concern  . Not on file  Social History Narrative   Lives in [place].  Active but doesn't exercise. Retired from [employer].    Outpatient Encounter Medications as of 06/18/2018  Medication Sig  . ALPRAZolam (XANAX) 0.5 MG tablet Take 1 tablet (0.5 mg total) by mouth 2 (two) times daily as needed for anxiety.  . benzonatate (TESSALON) 200 MG capsule Take 1 capsule (200 mg total) by mouth 3 (three) times daily as needed for cough.  . CARTIA XT 120 MG 24 hr capsule TAKE 1 CAPSULE BY MOUTH ONCE DAILY  . Cholecalciferol (VITAMIN D) 2000 UNITS CAPS Take 1 capsule by mouth daily.    Marland Kitchen ezetimibe (ZETIA) 10 MG tablet TAKE 1 TABLET BY MOUTH  DAILY  . furosemide (LASIX) 20 MG tablet Take 1 tablet (20 mg total) by mouth daily as needed. For fluid retention  . glucose blood test strip For One touch Verio Flex .  Use to check 3 times daily   e11.9  . hyoscyamine (LEVSIN SL) 0.125 MG SL tablet Place 1 tablet (0.125 mg total) under the tongue every 4 (four) hours as needed.  Marland Kitchen levothyroxine (SYNTHROID, LEVOTHROID) 112 MCG tablet Take 1 tablet (112 mcg total) by mouth daily before breakfast.  . nitroGLYCERIN (NITROSTAT) 0.4 MG SL tablet DISSOLVE ONE TABLET UNDER THE TONGUE EVERY 5 MINUTES AS NEEDED FOR CHEST PAIN.  DO NOT EXCEED A TOTAL OF 3 DOSES IN 15 MINUTES  . omeprazole (PRILOSEC) 40 MG capsule TAKE 1  CAPSULE BY MOUTH  DAILY  . ondansetron (ZOFRAN ODT) 4 MG disintegrating tablet Take 1 tablet (4 mg total) by mouth every 8 (eight) hours as needed for nausea or vomiting.  . predniSONE (DELTASONE) 10 MG tablet 6 tablets on Day 1 , then reduce by 1 tablet daily until gone  . sertraline (ZOLOFT) 100 MG tablet Take 2 tablets (200 mg total) by mouth daily.   No facility-administered encounter  medications on file as of 06/18/2018.     Activities of Daily Living In your present state of health, do you have any difficulty performing the following activities: 06/18/2018  Hearing? N  Vision? N  Difficulty concentrating or making decisions? N  Walking or climbing stairs? N  Dressing or bathing? N  Doing errands, shopping? N  Preparing Food and eating ? N  Using the Toilet? N  In the past six months, have you accidently leaked urine? N  Do you have problems with loss of bowel control? N  Managing your Medications? N  Managing your Finances? N  Housekeeping or managing your Housekeeping? N  Some recent data might be hidden    Patient Care Team: Crecencio Mc, MD as PCP - General (Internal Medicine)    Assessment:   This is a routine wellness examination for Megan Bradley.  Notes intermittent diarrhea and loose stools x1 week; in the morning mostly after breakfast. She declines follow up with pcp at this time.  Agrees to stay hydrate, monitor diet for triggers, try to decrease greasy food intake and keep scheduled appointment in 2 weeks.  Follow up sooner if needed.   Health Screenings  Mammogram-03/11/18 Colonoscopy- Bone Density-03/11/18 Glaucoma-none Hearing -passes the whisper test Hemoglobin A1C -12/30/17 (6.9) Cholesterol -12/30/17 (211) Dental- every 4 months Vision-annual, due  Social  Alcohol intake -yes, 1 glass wine per week Smoking history- none Smokers in home?none Illicit drug use? none Exercise-active, does not have regular exercise Diet -regular Sexually  [place]  Safety  Patient feels safe at home.  Patient does have smoke detectors at home  Patient does wear sunscreen or protective clothing when in direct sunlight  Patient does wear seat belt when driving or riding with others.   Activities of Daily Living Patient can do their own household chores. Denies needing assistance with: driving, feeding themselves, getting from bed to chair, getting to the toilet, bathing/showering, dressing, managing money, climbing flight of stairs, or preparing meals.   Depression Screen Patient denies losing interest in daily life, feeling hopeless, or crying easily over simple problems.   Fall Screen Patient denies being afraid of falling or falling in the last year.   Memory Screen Patient denies problems with memory, misplacing items, and is able to balance checkbook/bank accounts.  Patient is alert, normal appearance, oriented to person/place/and time. Correctly identified the president of the [place], recall of 2/3 objects, and performing simple calculations.  Patient displays appropriate judgement and can read correct time from watch face.   Immunizations The following Immunizations are up to date: Influenza, shingles, pneumonia, and tetanus.   Other Providers Patient Care Team: Crecencio Mc, MD as PCP - General (Internal Medicine)  Exercise Activities and Dietary recommendations Current Exercise Habits: Home exercise routine  Goals      Patient Stated   . Healthy Lifestyle (pt-stated)     Stay hydrated  Increase physical activity       Fall Risk Fall Risk  06/18/2018 06/17/2017 05/27/2016 04/18/2015 06/09/2014  Falls in the past year? 0 No Yes No No  Number falls in past yr: - - 1 - -  Injury with Fall? - - Yes - -  Comment - - Followed by PCP - -  Risk Factor Category  - - - - -  Comment - - - - -  Follow up - - Education provided;Falls prevention discussed - -   Depression Screen PHQ 2/9 Scores 06/18/2018 06/17/2017 05/27/2016  04/18/2015  PHQ - 2  Score 0 0 0 0  PHQ- 9 Score - 0 - -     Cognitive Function MMSE - Mini Mental State Exam 04/18/2015  Orientation to time 5  Orientation to Place 5  Registration 3  Attention/ Calculation 5  Recall 3  Language- name 2 objects 2  Language- repeat 1  Language- follow 3 step command 3  Language- read & follow direction 1  Write a sentence 1  Copy design 1  Total score 30     6CIT Screen 06/18/2018 06/17/2017 05/27/2016  What Year? 0 points 0 points 0 points  What month? 0 points 0 points 0 points  What time? 0 points 0 points 0 points  Count back from 20 0 points 0 points 0 points  Months in reverse 0 points 0 points 0 points  Repeat phrase 0 points 0 points -  Total Score 0 0 -    Immunization History  Administered Date(s) Administered  . DTaP 12/15/2011  . Influenza Split 04/14/2011, 03/02/2012  . Influenza, High Dose Seasonal PF 02/18/2016, 03/23/2017, 03/29/2018  . Influenza,inj,Quad PF,6+ Mos 03/30/2013, 03/04/2014, 03/02/2015  . Influenza-Unspecified 12/20/2011, 03/28/2015  . Pneumococcal Conjugate-13 06/07/2014  . Pneumococcal Polysaccharide-23 06/07/2008  . Tdap 06/07/2012   Screening Tests Health Maintenance  Topic Date Due  . OPHTHALMOLOGY EXAM  05/20/2017  . HEMOGLOBIN A1C  07/02/2018  . URINE MICROALBUMIN  12/31/2018  . FOOT EXAM  05/21/2019  . DEXA SCAN  03/11/2020  . TETANUS/TDAP  06/07/2022  . INFLUENZA VACCINE  Completed  . PNA vac Low Risk Adult  Completed  . COLONOSCOPY  Discontinued      Plan:    End of life planning; Advance aging; Advanced directives discussed. Copy of current HCPOA/Living Will requested.    I have personally reviewed and noted the following in the patient's chart:   . Medical and social history . Use of alcohol, tobacco or illicit drugs  . Current medications and supplements . Functional ability and status . Nutritional status . Physical activity . Advanced directives . List of other  physicians . Hospitalizations, surgeries, and ER visits in previous 12 months . Vitals . Screenings to include cognitive, depression, and falls . Referrals and appointments  In addition, I have reviewed and discussed with patient certain preventive protocols, quality metrics, and best practice recommendations. A written personalized care plan for preventive services as well as general preventive health recommendations were provided to patient.     OBrien-Blaney, Maycie Luera L, LPN  [id number]/6433   I have reviewed the above information and agree with above.   Deborra Medina, MD

## 2018-06-22 ENCOUNTER — Other Ambulatory Visit: Payer: Self-pay | Admitting: Internal Medicine

## 2018-06-22 MED ORDER — DILTIAZEM HCL ER COATED BEADS 120 MG PO CP24: 120.0000 mg | ORAL_CAPSULE | Freq: Every day | ORAL | 1 refills | Status: DC

## 2018-06-22 NOTE — Telephone Encounter (Signed)
Copied from [place] [id number]. Topic: Quick Communication - Rx Refill/Question >> Jun 22, 2018 10:15 AM Carolyn Stare wrote: Medication  CARTIA XT 120 MG 24 hr capsule     [facility] is out of this medicine and [facility] does and  is requestig  that the new RX  de sent to them   Has the patient contacted their pharmacy yes    Preferred Pharmacy  [facility] mail order   Agent: Please be advised that RX refills may take up to 3 business days. We ask that you follow-up with your pharmacy.

## 2018-06-22 NOTE — Telephone Encounter (Signed)
Requested medication (s) are due for refill today: yes  Requested medication (s) are on the active medication list: yes  Last refill:  03/16/18  Future visit scheduled: yes  Notes to clinic:  No protocol    Requested Prescriptions  Pending Prescriptions Disp Refills   diltiazem (CARTIA XT) 120 MG 24 hr capsule 90 capsule 1    Sig: Take 1 capsule (120 mg total) by mouth daily.     Off-Protocol Failed - 06/22/2018 10:29 AM      Failed - Medication not assigned to a protocol, review manually.      Passed - Valid encounter within last 12 months    Recent Outpatient Visits          1 month ago [place] Crecencio Mc, MD   2 months ago Chronic pain of left knee   [facility] Crecencio Mc, MD   5 months ago Encounter for preventive health examination   [place] Crecencio Mc, MD   1 year ago Other dysphagia   [facility] Crecencio Mc, MD   1 year ago History of pernicious anemia   [facility] Crecencio Mc, MD      Future Appointments            In 1 week Derrel Nip, Aris Everts, MD [place], [place]   In 12 months O'Brien-Blaney, Bryson Corona, LPN [place], [place]   In 12 months Derrel Nip, Aris Everts, MD [facility], [place]

## 2018-06-29 ENCOUNTER — Other Ambulatory Visit: Payer: Self-pay | Admitting: Internal Medicine

## 2018-06-29 DIAGNOSIS — T8543XA Leakage of breast prosthesis and implant, initial encounter: Secondary | ICD-10-CM

## 2018-06-29 DIAGNOSIS — Z9882 Breast implant status: Secondary | ICD-10-CM

## 2018-07-02 ENCOUNTER — Ambulatory Visit (INDEPENDENT_AMBULATORY_CARE_PROVIDER_SITE_OTHER): Payer: Medicare Other | Admitting: Internal Medicine

## 2018-07-02 VITALS — BP 108/64 | HR 62 | Temp 98.3°F | Resp 16 | Ht 65.0 in | Wt 148.1 lb

## 2018-07-02 DIAGNOSIS — R197 Diarrhea, unspecified: Secondary | ICD-10-CM

## 2018-07-02 DIAGNOSIS — E559 Vitamin D deficiency, unspecified: Secondary | ICD-10-CM | POA: Diagnosis not present

## 2018-07-02 DIAGNOSIS — R5383 Other fatigue: Secondary | ICD-10-CM | POA: Diagnosis not present

## 2018-07-02 DIAGNOSIS — K909 Intestinal malabsorption, unspecified: Secondary | ICD-10-CM

## 2018-07-02 DIAGNOSIS — E032 Hypothyroidism due to medicaments and other exogenous substances: Secondary | ICD-10-CM | POA: Diagnosis not present

## 2018-07-02 DIAGNOSIS — Z9882 Breast implant status: Secondary | ICD-10-CM

## 2018-07-02 DIAGNOSIS — E119 Type 2 diabetes mellitus without complications: Secondary | ICD-10-CM

## 2018-07-02 DIAGNOSIS — R194 Change in bowel habit: Secondary | ICD-10-CM

## 2018-07-02 LAB — CBC WITH DIFFERENTIAL/PLATELET
Basophils Absolute: 0 10*3/uL (ref 0.0–0.1)
Basophils Relative: 0.5 % (ref 0.0–3.0)
Eosinophils Absolute: 0.1 10*3/uL (ref 0.0–0.7)
Eosinophils Relative: 0.9 % (ref 0.0–5.0)
HCT: 39 % (ref 36.0–46.0)
Hemoglobin: 12.9 g/dL (ref 12.0–15.0)
Lymphocytes Relative: 19 % (ref 12.0–46.0)
Lymphs Abs: 1.5 10*3/uL (ref 0.7–4.0)
MCHC: 33.1 g/dL (ref 30.0–36.0)
MCV: 90.5 fl (ref 78.0–100.0)
Monocytes Absolute: 0.4 10*3/uL (ref 0.1–1.0)
Monocytes Relative: 5.4 % (ref 3.0–12.0)
Neutro Abs: 5.7 10*3/uL (ref 1.4–7.7)
Neutrophils Relative %: 74.2 % (ref 43.0–77.0)
Platelets: 178 10*3/uL (ref 150.0–400.0)
RBC: 4.31 Mil/uL (ref 3.87–5.11)
RDW: 15.5 % (ref 11.5–15.5)
WBC: 7.7 10*3/uL (ref 4.0–10.5)

## 2018-07-02 LAB — VITAMIN D 25 HYDROXY (VIT D DEFICIENCY, FRACTURES): VITD: 39.59 ng/mL (ref 30.00–100.00)

## 2018-07-02 LAB — LIPID PANEL
CHOL/HDL RATIO: 3
Cholesterol: 220 mg/dL — ABNORMAL HIGH (ref 0–200)
HDL: 80.3 mg/dL (ref >39.00)
LDL Cholesterol: 126 mg/dL — ABNORMAL HIGH (ref 0–99)
NonHDL: 139.48
Triglycerides: 65 mg/dL (ref 0.0–149.0)
VLDL: 13 mg/dL (ref 0.0–40.0)

## 2018-07-02 LAB — LIPASE: Lipase: 12 U/L (ref 11.0–59.0)

## 2018-07-02 LAB — HEMOGLOBIN A1C: Hgb A1c MFr Bld: 6.8 % — ABNORMAL HIGH (ref 4.6–6.5)

## 2018-07-02 LAB — TSH: TSH: 1.89 u[IU]/mL (ref 0.35–4.50)

## 2018-07-02 LAB — VITAMIN B12: Vitamin B-12: 596 pg/mL (ref 211–911)

## 2018-07-02 NOTE — Patient Instructions (Signed)
I THINK YOUR SYMPTOMS MAY BE COMING FROM SMALL BOWEL OVERGROWTH   PLEASE RETURN A STOOL SAMPLE TO THE LABS FOR A FECAL FAT TEST.  THIS WILL HELP NARROW DOWN THE CAUSE  INCREASE YOUR WATER INTAKE AND TRY ADDING   (A bulk forming laxative if you haven't already) suche as citrucel, metamucil

## 2018-07-02 NOTE — Progress Notes (Signed)
Subjective:  Patient ID: Megan Bradley, female    DOB: 03/15/1941  Age: 78 y.o. MRN: [id number]  CC: The primary encounter diagnosis was Stool fat increased. Diagnoses of Fatigue, unspecified type, Diarrhea, unspecified type, Vitamin D deficiency, Diabetes mellitus without complication ([place]), Hypovitaminosis D, Iatrogenic hypothyroidism, Change in bowel habits, and History of bilateral saline breast implants were also pertinent to this visit.  HPI ZAMIA TYMINSKI presents for 6  month follow up on diabetes.  Patient has multiple complaints today. Fatigue,  No motivation.  Feels nervous all the time.   Happy in marriage retirement Production assistant, radio well, loves  Her new puppy    For several weeks has been Mildly nauseated most of the time,  Having Frequent slimy small volume stools that float mostly and are  occurring before noon, accompanied by mild cramping and a feeling of incomplete evacuation .  Appetite has been poor.   Weight stable here but she states that by her scales she has lost 15 lbs since Jan 2019   Had another episode of nutracker esophagus that eventually responded to levsin (after 2 hours )     Patient is following a low glycemic index diet and taking all prescribed medications regularly without side effects.  Fasting sugars have been under less than 140 most of the time and post prandials have been under 160 except on rare occasions. Patient is not exercising.   Patient has had an eye exam in the last 12 months and checks feet regularly for signs of infection.  Patient does not walk barefoot outside,  And denies any numbness tingling or burning in feet. Patient is up to date on all recommended vaccinations  Outpatient Medications Prior to Visit  Medication Sig Dispense Refill  . ALPRAZolam (XANAX) 0.5 MG tablet Take 1 tablet (0.5 mg total) by mouth 2 (two) times daily as needed for anxiety. 60 tablet 1  . Cholecalciferol (VITAMIN D) 2000 UNITS CAPS Take 1 capsule by mouth daily.      Marland Kitchen  diltiazem (CARTIA XT) 120 MG 24 hr capsule Take 1 capsule (120 mg total) by mouth daily. 90 capsule 1  . ezetimibe (ZETIA) 10 MG tablet TAKE 1 TABLET BY MOUTH  DAILY 90 tablet 1  . furosemide (LASIX) 20 MG tablet Take 1 tablet (20 mg total) by mouth daily as needed. For fluid retention 30 tablet 3  . glucose blood test strip For One touch Verio Flex .  Use to check 3 times daily   e11.9 100 each 0  . hyoscyamine (LEVSIN SL) 0.125 MG SL tablet Place 1 tablet (0.125 mg total) under the tongue every 4 (four) hours as needed. 30 tablet 5  . levothyroxine (SYNTHROID, LEVOTHROID) 112 MCG tablet Take 1 tablet (112 mcg total) by mouth daily before breakfast. 90 tablet 0  . nitroGLYCERIN (NITROSTAT) 0.4 MG SL tablet DISSOLVE ONE TABLET UNDER THE TONGUE EVERY 5 MINUTES AS NEEDED FOR CHEST PAIN.  DO NOT EXCEED A TOTAL OF 3 DOSES IN 15 MINUTES 50 tablet 3  . omeprazole (PRILOSEC) 40 MG capsule TAKE 1 CAPSULE BY MOUTH  DAILY 90 capsule 2  . ondansetron (ZOFRAN ODT) 4 MG disintegrating tablet Take 1 tablet (4 mg total) by mouth every 8 (eight) hours as needed for nausea or vomiting. 20 tablet 0  . sertraline (ZOLOFT) 100 MG tablet Take 2 tablets (200 mg total) by mouth daily. 180 tablet 1  . benzonatate (TESSALON) 200 MG capsule Take 1 capsule (200 mg total)  by mouth 3 (three) times daily as needed for cough. (Patient not taking: Reported on 07/02/2018) 60 capsule 1  . predniSONE (DELTASONE) 10 MG tablet 6 tablets on Day 1 , then reduce by 1 tablet daily until gone (Patient not taking: Reported on 07/02/2018) 21 tablet 0   No facility-administered medications prior to visit.     Review of Systems;  Patient denies headache, fevers, malaise, unintentional weight loss, skin rash, eye pain, sinus congestion and sinus pain, sore throat, dysphagia,  hemoptysis , cough, dyspnea, wheezing, chest pain, palpitations, orthopnea, edema, abdominal pain, nausea, melena, diarrhea, constipation, flank pain, dysuria, hematuria,  urinary  Frequency, nocturia, numbness, tingling, seizures,  Focal weakness, Loss of consciousness,  Tremor, insomnia, depression, anxiety, and suicidal ideation.      Objective:  BP 108/64 (BP Location: Left Arm, Patient Position: Sitting, Cuff Size: Normal)   Pulse 62   Temp 98.3 F (36.8 C) (Oral)   Resp 16   Ht 5\' 5"  (1.651 m)   Wt 148 lb 1.9 oz (67.2 kg)   SpO2 95%   BMI 24.65 kg/m   BP Readings from Last 3 Encounters:  07/02/18 108/64  06/18/18 110/68  05/20/18 120/78    Wt Readings from Last 3 Encounters:  07/02/18 148 lb 1.9 oz (67.2 kg)  06/18/18 148 lb (67.1 kg)  05/20/18 149 lb 12.8 oz (67.9 kg)    General appearance: alert, cooperative and appears stated age Ears: normal TM's and external ear canals both ears Throat: lips, mucosa, and tongue normal; teeth and gums normal Neck: no adenopathy, no carotid bruit, supple, symmetrical, trachea midline and thyroid not enlarged, symmetric, no tenderness/mass/nodules Back: symmetric, no curvature. ROM normal. No CVA tenderness. Lungs: clear to auscultation bilaterally Heart: regular rate and rhythm, S1, S2 normal, no murmur, click, rub or gallop Abdomen: soft, non-tender; bowel sounds normal; no masses,  no organomegaly Pulses: 2+ and symmetric Skin: Skin color, texture, turgor normal. No rashes or lesions Lymph nodes: Cervical, supraclavicular, and axillary nodes normal.  Lab Results  Component Value Date   HGBA1C 6.8 (H) 07/02/2018   HGBA1C 6.9 (H) 12/30/2017   HGBA1C 6.6 (H) 06/17/2017    Lab Results  Component Value Date   CREATININE 1.03 12/30/2017   CREATININE 0.98 06/17/2017   CREATININE 0.95 03/24/2017    Lab Results  Component Value Date   WBC 7.7 07/02/2018   HGB 12.9 07/02/2018   HCT 39.0 07/02/2018   PLT 178.0 07/02/2018   GLUCOSE 98 12/30/2017   CHOL 220 (H) 07/02/2018   TRIG 65.0 07/02/2018   HDL 80.30 07/02/2018   LDLDIRECT 93.0 01/02/2016   LDLCALC 126 (H) 07/02/2018   ALT 17  12/30/2017   AST 15 12/30/2017   NA 141 12/30/2017   K 4.0 12/30/2017   CL 101 12/30/2017   CREATININE 1.03 12/30/2017   BUN 23 12/30/2017   CO2 29 12/30/2017   TSH 1.89 07/02/2018   INR 0.9 12/05/2011   HGBA1C 6.8 (H) 07/02/2018   MICROALBUR 0.8 12/30/2017    Mr Breast Bilateral Wo Contrast  Result Date: 05/24/2018 CLINICAL DATA:  Bilateral breast augmentation in 1974. Assess integrity of silicone implants. EXAM: BILATERAL BREAST MRI  WITHOUT CONTRAST TECHNIQUE: Multiplanar, multisequence MR images of both breasts without contrast. The exam was used to evaluate implant integrity. Because of the sequences used and the lack of intravenous contrast, this study is not diagnostic for breast lesions. Three-dimensional MR images were rendered by post-processing of the original MR data on an independent  workstation. The three-dimensional MR images were interpreted, and findings are reported in the following complete MRI report for this study. Three dimensional images were evaluated at the independent Dyna[employer] workstation COMPARISON:  03/11/2018 mammogram FINDINGS: Breast composition: b. Scattered fibroglandular tissue. Right breast: Patient has a retroglandular silicone implant. Extra capsular silicone is identified adjacent to the implant in the posterior UPPER-OUTER QUADRANT, posterior LOWER INNER QUADRANT, in the posterior central portions of the breast. Left breast: The patient has a retroglandular silicone implant. Extra capsular silicone is identified adjacent to the implant in the anterior LOWER INNER QUADRANT and the posterior UPPER INNER QUADRANT. Ancillary findings: None. IMPRESSION: 1. Extracapsular rupture of the RIGHT retroglandular silicone implant. 2. Extracapsular rupture of the LEFT retroglandular silicone implant. RECOMMENDATION: Consider consultation with Plastic Surgery to discuss surgical options. Screening mammogram is recommended annually, next in October 2020. Electronically Signed    By: Nolon Nations M.D.   On: 05/24/2018 12:51    Assessment & Plan:   Problem List Items Addressed This Visit    Change in bowel habits    Accompanied by weight loss, bloating,  Nausea, , and loss of appetite.  Feces float.  Given her history of DM,  Suspect small bowel overgrowth .  Fecal fat testing advised.       Diabetes mellitus without complication ([place])    Remains well-controlled on low GI diet.  . Patient has no microalbuminuria. Patient is tolerating statin therapy for CAD risk reduction and on ACE/ARB for renal protection and hypertension . Lab Results  Component Value Date   HGBA1C 6.8 (H) 07/02/2018   Lab Results  Component Value Date   MICROALBUR 0.8 12/30/2017         Relevant Orders   Hemoglobin A1c (Completed)   Lipid panel (Completed)   History of bilateral saline breast implants    With bilateral ruptures demonstrated on MRI>  Sees plastic surgeon on Monday       Hypovitaminosis D   Relevant Orders   VITAMIN D 25 Hydroxy (Vit-D Deficiency, Fractures) (Completed)   Iatrogenic hypothyroidism    Thyroid function is WNL on current dose.  No current changes needed.   Lab Results  Component Value Date   TSH 1.89 07/02/2018         Relevant Orders   TSH (Completed)    Other Visit Diagnoses    Stool fat increased    -  Primary   Relevant Orders   Lipase (Completed)   Fecal fat, qualitative   Fatigue, unspecified type       Relevant Orders   Vitamin B12 (Completed)   Iron, TIBC and Ferritin Panel (Completed)   CBC with Differential/Platelet (Completed)   Diarrhea, unspecified type       Relevant Orders   Lipase (Completed)   Vitamin D deficiency          I have discontinued Vaughan Basta B. Mccullers's predniSONE and benzonatate. I am also having her maintain her Vitamin D, glucose blood, furosemide, ondansetron, hyoscyamine, sertraline, nitroGLYCERIN, ezetimibe, ALPRAZolam, omeprazole, levothyroxine, and diltiazem.  No orders of the defined types were  placed in this encounter.   Medications Discontinued During This Encounter  Medication Reason  . benzonatate (TESSALON) 200 MG capsule Completed Course  . predniSONE (DELTASONE) 10 MG tablet Completed Course    Follow-up: No follow-ups on file.   Crecencio Mc, MD

## 2018-07-03 ENCOUNTER — Other Ambulatory Visit
Admission: RE | Admit: 2018-07-03 | Discharge: 2018-07-03 | Disposition: A | Discharge status: Home / Self Care | Payer: Medicare Other | Source: Ambulatory Visit | Attending: Internal Medicine | Admitting: Internal Medicine

## 2018-07-03 DIAGNOSIS — K909 Intestinal malabsorption, unspecified: Secondary | ICD-10-CM | POA: Diagnosis not present

## 2018-07-03 LAB — IRON,TIBC AND FERRITIN PANEL
%SAT: 25 % (calc) (ref 16–45)
FERRITIN: 287 ng/mL (ref 16–288)
Iron: 76 ug/dL (ref 45–160)
TIBC: 310 mcg/dL (calc) (ref 250–450)

## 2018-07-04 DIAGNOSIS — R194 Change in bowel habit: Secondary | ICD-10-CM | POA: Insufficient documentation

## 2018-07-04 NOTE — Assessment & Plan Note (Signed)
Thyroid function is WNL on current dose.  No current changes needed.   Lab Results  Component Value Date   TSH 1.89 07/02/2018

## 2018-07-04 NOTE — Assessment & Plan Note (Signed)
With bilateral ruptures demonstrated on MRI>  Sees plastic surgeon on Monday

## 2018-07-04 NOTE — Assessment & Plan Note (Addendum)
Accompanied by weight loss, bloating,  Nausea, , and loss of appetite.  Feces float.  Given her history of DM,  Suspect small bowel overgrowth .  Fecal fat testing advised.

## 2018-07-04 NOTE — Assessment & Plan Note (Signed)
Remains well-controlled on low GI diet.  . Patient has no microalbuminuria. Patient is tolerating statin therapy for CAD risk reduction and on ACE/ARB for renal protection and hypertension . Lab Results  Component Value Date   HGBA1C 6.8 (H) 07/02/2018   Lab Results  Component Value Date   MICROALBUR 0.8 12/30/2017

## 2018-07-05 ENCOUNTER — Ambulatory Visit: Payer: Medicare Other | Admitting: Plastic Surgery

## 2018-07-05 ENCOUNTER — Encounter: Payer: Self-pay | Admitting: Plastic Surgery

## 2018-07-05 DIAGNOSIS — T8543XA Leakage of breast prosthesis and implant, initial encounter: Secondary | ICD-10-CM | POA: Diagnosis not present

## 2018-07-05 HISTORY — DX: Leakage of breast prosthesis and implant, initial encounter: T85.43XA

## 2018-07-05 NOTE — Progress Notes (Signed)
Patient ID: Megan Bradley, female    DOB: 02/13/1941, 78 y.o.   MRN: [id number]   Chief Complaint  Patient presents with  . Breast Problem    The patient is a 78 year old white female here for consultation for ruptured breast implants. She had the implants placed 30 yrs ago.  They were placed from an inframammary incision. She went from an A cup to a C/D cup.  She had a left breast biopsy with a well healed scar at the medial aspect of the areola.  The path was negative for cancer.  She has a recent Mammogram and MRI (12/19) which showed bilateral extracapsular rupture.  She is 5 feet 3 inches tall, weight is 142 pounds. Her breasts are firm in the superior poll on each side.  The skin is intact and the incisions are well healed.  She has an inverted nipple on the right side.  She has ptosis of the soft tissue and skin.  No axillary lymphadenopathy noted.   Review of Systems  Constitutional: Negative for activity change and appetite change.  HENT: Negative.   Eyes: Negative.   Respiratory: Negative.  Negative for shortness of breath.   Cardiovascular: Negative.   Gastrointestinal: Negative.  Negative for abdominal pain.  Endocrine: Negative.   Genitourinary: Negative.   Musculoskeletal: Negative.  Negative for joint swelling.  Skin: Negative.  Negative for color change and wound.  Hematological: Negative.   Psychiatric/Behavioral: Negative.     Past Medical History:  Diagnosis Date  . Anxiety   . Anxiety disorder   . Cervical dysplasia    a. 2010 - high grade squamous intraepithelial lesion s/p excision.  . Chest pain    a. 2006 or 2007 Cath [facility]): reportedly nl cors;  b. 09/2011 ETT: nl.  . GERD (gastroesophageal reflux disease)   . Herpes zoster   . Hypothyroidism   . Orthostatic hypotension   . Pre-diabetes   . RBBB   . Vertigo   . Wears dentures    partial lower    Past Surgical History:  Procedure Laterality Date  . AUGMENTATION MAMMAPLASTY Bilateral   .  BREAST EXCISIONAL BIOPSY Left   . CARDIAC CATHETERIZATION  2003   Dr.Callwood, R/L heart cath,  . COLONOSCOPY WITH PROPOFOL N/A 06/12/2017   Procedure: COLONOSCOPY WITH PROPOFOL;  Surgeon: Lucilla Lame, MD;  Location: [place];  Service: Endoscopy;  Laterality: N/A;  . ESOPHAGEAL MANOMETRY N/A 06/19/2016   Procedure: ESOPHAGEAL MANOMETRY (EM);  Surgeon: Lucilla Lame, MD;  Location: [facility];  Service: Endoscopy;  Laterality: N/A;  . ESOPHAGOGASTRODUODENOSCOPY (EGD) WITH PROPOFOL N/A 04/03/2017   Procedure: ESOPHAGOGASTRODUODENOSCOPY (EGD) WITH PROPOFOL;  Surgeon: Lucilla Lame, MD;  Location: [place];  Service: Endoscopy;  Laterality: N/A;  diabetic - diet controlled  . THYROIDECTOMY  05/15/15   [facility]      Current Outpatient Medications:  .  ALPRAZolam (XANAX) 0.5 MG tablet, Take 1 tablet (0.5 mg total) by mouth 2 (two) times daily as needed for anxiety., Disp: 60 tablet, Rfl: 1 .  Cholecalciferol (VITAMIN D) 2000 UNITS CAPS, Take 1 capsule by mouth daily.  , Disp: , Rfl:  .  diltiazem (CARTIA XT) 120 MG 24 hr capsule, Take 1 capsule (120 mg total) by mouth daily., Disp: 90 capsule, Rfl: 1 .  ezetimibe (ZETIA) 10 MG tablet, TAKE 1 TABLET BY MOUTH  DAILY, Disp: 90 tablet, Rfl: 1 .  furosemide (LASIX) 20 MG tablet, Take 1 tablet (20 mg total) by  mouth daily as needed. For fluid retention, Disp: 30 tablet, Rfl: 3 .  glucose blood test strip, For One touch Verio Flex .  Use to check 3 times daily   e11.9, Disp: 100 each, Rfl: 0 .  hyoscyamine (LEVSIN SL) 0.125 MG SL tablet, Place 1 tablet (0.125 mg total) under the tongue every 4 (four) hours as needed., Disp: 30 tablet, Rfl: 5 .  levothyroxine (SYNTHROID, LEVOTHROID) 112 MCG tablet, Take 1 tablet (112 mcg total) by mouth daily before breakfast., Disp: 90 tablet, Rfl: 0 .  nitroGLYCERIN (NITROSTAT) 0.4 MG SL tablet, DISSOLVE ONE TABLET UNDER THE TONGUE EVERY 5 MINUTES AS NEEDED FOR CHEST PAIN.  DO NOT EXCEED A TOTAL OF 3  DOSES IN 15 MINUTES, Disp: 50 tablet, Rfl: 3 .  omeprazole (PRILOSEC) 40 MG capsule, TAKE 1 CAPSULE BY MOUTH  DAILY, Disp: 90 capsule, Rfl: 2 .  ondansetron (ZOFRAN ODT) 4 MG disintegrating tablet, Take 1 tablet (4 mg total) by mouth every 8 (eight) hours as needed for nausea or vomiting., Disp: 20 tablet, Rfl: 0 .  sertraline (ZOLOFT) 100 MG tablet, Take 2 tablets (200 mg total) by mouth daily., Disp: 180 tablet, Rfl: 1   Objective:   Vitals:   07/05/18 0930  BP: 131/73  Pulse: 73  Temp: 97.7 F (36.5 C)  SpO2: 99%    Physical Exam Vitals signs and nursing note reviewed.  Constitutional:      Appearance: Normal appearance.  HENT:     Head: Normocephalic and atraumatic.     Nose: Nose normal.     Mouth/Throat:     Mouth: Mucous membranes are moist.  Eyes:     Pupils: Pupils are equal, round, and reactive to light.  Neck:     Musculoskeletal: Normal range of motion.  Cardiovascular:     Rate and Rhythm: Normal rate.  Pulmonary:     Effort: Pulmonary effort is normal.  Abdominal:     General: Abdomen is flat. There is no distension.  Neurological:     General: No focal deficit present.     Mental Status: She is alert.  Psychiatric:        Mood and Affect: Mood normal.        Thought Content: Thought content normal.        Judgment: Judgment normal.     Assessment & Plan:  Ruptured left breast implant, initial encounter  Ruptured right breast implant, initial encounter  Recommend bilateral removal of ruptured implants and complete capsulectomy.  She has a desire for replacement and will think about that and a mastopexy.  McConnell, DO

## 2018-07-06 DIAGNOSIS — E119 Type 2 diabetes mellitus without complications: Secondary | ICD-10-CM | POA: Diagnosis not present

## 2018-07-06 DIAGNOSIS — Z9842 Cataract extraction status, left eye: Secondary | ICD-10-CM | POA: Diagnosis not present

## 2018-07-06 DIAGNOSIS — H1852 Epithelial (juvenile) corneal dystrophy: Secondary | ICD-10-CM | POA: Diagnosis not present

## 2018-07-06 DIAGNOSIS — H5203 Hypermetropia, bilateral: Secondary | ICD-10-CM | POA: Diagnosis not present

## 2018-07-06 DIAGNOSIS — Z9841 Cataract extraction status, right eye: Secondary | ICD-10-CM | POA: Diagnosis not present

## 2018-07-06 LAB — FECAL FAT, QUALITATIVE
Fat Qual Neutral, Stl: NORMAL
Fat Qual Total, Stl: NORMAL

## 2018-07-06 LAB — HM DIABETES EYE EXAM

## 2018-07-08 ENCOUNTER — Other Ambulatory Visit: Payer: Self-pay | Admitting: Internal Medicine

## 2018-07-08 DIAGNOSIS — R630 Anorexia: Secondary | ICD-10-CM

## 2018-07-08 DIAGNOSIS — K909 Intestinal malabsorption, unspecified: Secondary | ICD-10-CM

## 2018-08-03 ENCOUNTER — Encounter: Payer: Self-pay | Admitting: Plastic Surgery

## 2018-08-03 ENCOUNTER — Ambulatory Visit (INDEPENDENT_AMBULATORY_CARE_PROVIDER_SITE_OTHER): Payer: Self-pay | Admitting: Plastic Surgery

## 2018-08-03 VITALS — BP 127/85 | HR 69 | Temp 98.5°F | Ht 65.5 in | Wt 149.0 lb

## 2018-08-03 DIAGNOSIS — T8543XA Leakage of breast prosthesis and implant, initial encounter: Secondary | ICD-10-CM

## 2018-08-03 MED ORDER — DIAZEPAM 2 MG PO TABS: 2.0000 mg | ORAL_TABLET | Freq: Four times a day (QID) | ORAL | 0 refills | Status: DC | PRN

## 2018-08-03 MED ORDER — HYDROCODONE-ACETAMINOPHEN 5-325 MG PO TABS: 1.0000 | ORAL_TABLET | Freq: Four times a day (QID) | ORAL | 0 refills | Status: AC | PRN

## 2018-08-03 MED ORDER — CEPHALEXIN 500 MG PO CAPS: 500.0000 mg | ORAL_CAPSULE | Freq: Four times a day (QID) | ORAL | 0 refills | Status: DC

## 2018-08-03 MED ORDER — ONDANSETRON HCL 4 MG PO TABS: 4.0000 mg | ORAL_TABLET | Freq: Three times a day (TID) | ORAL | 0 refills | Status: DC | PRN

## 2018-08-03 NOTE — Progress Notes (Signed)
Patient ID: Megan Bradley, female    DOB: [birth year], 78 y.o.   MRN: [id number]   Chief Complaint  Patient presents with  . Breast Problem    The patient is a 78 year old female here for history and physical for breast surgery.  She had breast implants placed 30 years ago through an inframammary incision.  She is not aware of her implant size and does not have access to any records.  She went from an A cup to a C cup.  She would like to be around a B cup size.  Her mammogram and MRI from December showed bilateral extracapsular rupture.  She is 5 feet 3 inches tall and weighs 142 pounds.  She has firmness in the upper poles on both sides with the left being worse.  She does not have any lumps or axillary lymphadenopathy.  We discussed the possibility for a mastopexy.  She got a quote and is going to think about it.   Review of Systems  Constitutional: Negative.   HENT: Negative.   Eyes: Negative.   Respiratory: Negative.   Cardiovascular: Negative.   Gastrointestinal: Negative.   Endocrine: Negative.   Genitourinary: Negative.   Musculoskeletal: Negative.   Hematological: Negative.   Psychiatric/Behavioral: Negative.     Past Medical History:  Diagnosis Date  . Anxiety   . Anxiety disorder   . Cervical dysplasia    a. 2010 - high grade squamous intraepithelial lesion s/p excision.  . Chest pain    a. 2006 or 2007 Cath [facility]): reportedly nl cors;  b. 09/2011 ETT: nl.  . GERD (gastroesophageal reflux disease)   . Herpes zoster   . Hypothyroidism   . Orthostatic hypotension   . Pre-diabetes   . RBBB   . Vertigo   . Wears dentures    partial lower    Past Surgical History:  Procedure Laterality Date  . AUGMENTATION MAMMAPLASTY Bilateral   . BREAST EXCISIONAL BIOPSY Left   . CARDIAC CATHETERIZATION  2003   Dr.Callwood, R/L heart cath,  . COLONOSCOPY WITH PROPOFOL N/A 06/12/2017   Procedure: COLONOSCOPY WITH PROPOFOL;  Surgeon: Lucilla Lame, MD;  Location: [place];  Service: Endoscopy;  Laterality: N/A;  . ESOPHAGEAL MANOMETRY N/A 06/19/2016   Procedure: ESOPHAGEAL MANOMETRY (EM);  Surgeon: Lucilla Lame, MD;  Location: [facility];  Service: Endoscopy;  Laterality: N/A;  . ESOPHAGOGASTRODUODENOSCOPY (EGD) WITH PROPOFOL N/A 04/03/2017   Procedure: ESOPHAGOGASTRODUODENOSCOPY (EGD) WITH PROPOFOL;  Surgeon: Lucilla Lame, MD;  Location: [place];  Service: Endoscopy;  Laterality: N/A;  diabetic - diet controlled  . THYROIDECTOMY  05/15/15   [facility]      Current Outpatient Medications:  .  ALPRAZolam (XANAX) 0.5 MG tablet, Take 1 tablet (0.5 mg total) by mouth 2 (two) times daily as needed for anxiety., Disp: 60 tablet, Rfl: 1 .  cephALEXin (KEFLEX) 500 MG capsule, Take 1 capsule (500 mg total) by mouth 4 (four) times daily., Disp: 20 capsule, Rfl: 0 .  cephALEXin (KEFLEX) 500 MG capsule, Take 1 capsule (500 mg total) by mouth 4 (four) times daily., Disp: 20 capsule, Rfl: 0 .  Cholecalciferol (VITAMIN D) 2000 UNITS CAPS, Take 1 capsule by mouth daily.  , Disp: , Rfl:  .  diazepam (VALIUM) 2 MG tablet, Take 1 tablet (2 mg total) by mouth every 6 (six) hours as needed for muscle spasms (post op)., Disp: 20 tablet, Rfl: 0 .  diazepam (VALIUM) 2 MG tablet, Take 1 tablet (2  mg total) by mouth every 6 (six) hours as needed for muscle spasms (post op)., Disp: 20 tablet, Rfl: 0 .  diltiazem (CARTIA XT) 120 MG 24 hr capsule, Take 1 capsule (120 mg total) by mouth daily., Disp: 90 capsule, Rfl: 1 .  ezetimibe (ZETIA) 10 MG tablet, TAKE 1 TABLET BY MOUTH  DAILY, Disp: 90 tablet, Rfl: 1 .  furosemide (LASIX) 20 MG tablet, Take 1 tablet (20 mg total) by mouth daily as needed. For fluid retention, Disp: 30 tablet, Rfl: 3 .  glucose blood test strip, For One touch Verio Flex .  Use to check 3 times daily   e11.9, Disp: 100 each, Rfl: 0 .  HYDROcodone-acetaminophen (NORCO/VICODIN) 5-325 MG tablet, Take 1 tablet by mouth every 6 (six) hours as needed for up to 5  days for moderate pain (post op)., Disp: 20 tablet, Rfl: 0 .  HYDROcodone-acetaminophen (NORCO/VICODIN) 5-325 MG tablet, Take 1 tablet by mouth every 6 (six) hours as needed for up to 5 days for moderate pain (post op)., Disp: 20 tablet, Rfl: 0 .  hyoscyamine (LEVSIN SL) 0.125 MG SL tablet, Place 1 tablet (0.125 mg total) under the tongue every 4 (four) hours as needed., Disp: 30 tablet, Rfl: 5 .  levothyroxine (SYNTHROID, LEVOTHROID) 112 MCG tablet, Take 1 tablet (112 mcg total) by mouth daily before breakfast., Disp: 90 tablet, Rfl: 0 .  nitroGLYCERIN (NITROSTAT) 0.4 MG SL tablet, DISSOLVE ONE TABLET UNDER THE TONGUE EVERY 5 MINUTES AS NEEDED FOR CHEST PAIN.  DO NOT EXCEED A TOTAL OF 3 DOSES IN 15 MINUTES, Disp: 50 tablet, Rfl: 3 .  omeprazole (PRILOSEC) 40 MG capsule, TAKE 1 CAPSULE BY MOUTH  DAILY, Disp: 90 capsule, Rfl: 2 .  ondansetron (ZOFRAN ODT) 4 MG disintegrating tablet, Take 1 tablet (4 mg total) by mouth every 8 (eight) hours as needed for nausea or vomiting., Disp: 20 tablet, Rfl: 0 .  ondansetron (ZOFRAN) 4 MG tablet, Take 1 tablet (4 mg total) by mouth every 8 (eight) hours as needed for nausea or vomiting (post op)., Disp: 20 tablet, Rfl: 0 .  ondansetron (ZOFRAN) 4 MG tablet, Take 1 tablet (4 mg total) by mouth every 8 (eight) hours as needed for nausea or vomiting (post op)., Disp: 20 tablet, Rfl: 0 .  sertraline (ZOLOFT) 100 MG tablet, Take 2 tablets (200 mg total) by mouth daily., Disp: 180 tablet, Rfl: 1   Objective:   Vitals:   08/03/18 0832  BP: 127/85  Pulse: 69  Temp: 98.5 F (36.9 C)  SpO2: 98%    Physical Exam Vitals signs and nursing note reviewed.  Constitutional:      Appearance: Normal appearance.  HENT:     Head: Normocephalic and atraumatic.     Nose: Nose normal.     Mouth/Throat:     Mouth: Mucous membranes are moist.  Cardiovascular:     Rate and Rhythm: Normal rate.  Pulmonary:     Effort: Pulmonary effort is normal.  Abdominal:     General:  Abdomen is flat. There is no distension.     Tenderness: There is no abdominal tenderness.  Neurological:     General: No focal deficit present.     Mental Status: She is alert.  Psychiatric:        Mood and Affect: Mood normal.        Thought Content: Thought content normal.        Judgment: Judgment normal.     Assessment & Plan:  Ruptured  right breast implant, initial encounter  Ruptured left breast implant, initial encounter Plan for removal of bilateral ruptured implants and capsulectomies.  Will add a mastopexy if the patient decides to add this.  This will add time and cost which she is going to think about.  [place], DO

## 2018-08-04 DIAGNOSIS — L565 Disseminated superficial actinic porokeratosis (DSAP): Secondary | ICD-10-CM | POA: Diagnosis not present

## 2018-08-04 DIAGNOSIS — Z08 Encounter for follow-up examination after completed treatment for malignant neoplasm: Secondary | ICD-10-CM | POA: Diagnosis not present

## 2018-08-04 DIAGNOSIS — D485 Neoplasm of uncertain behavior of skin: Secondary | ICD-10-CM | POA: Diagnosis not present

## 2018-08-04 DIAGNOSIS — L570 Actinic keratosis: Secondary | ICD-10-CM | POA: Diagnosis not present

## 2018-08-04 DIAGNOSIS — L281 Prurigo nodularis: Secondary | ICD-10-CM | POA: Diagnosis not present

## 2018-08-04 DIAGNOSIS — Z85828 Personal history of other malignant neoplasm of skin: Secondary | ICD-10-CM | POA: Diagnosis not present

## 2018-08-04 DIAGNOSIS — D045 Carcinoma in situ of skin of trunk: Secondary | ICD-10-CM | POA: Diagnosis not present

## 2018-08-20 ENCOUNTER — Encounter: Payer: Medicare Other | Admitting: Plastic Surgery

## 2018-08-23 ENCOUNTER — Other Ambulatory Visit: Payer: Self-pay | Admitting: Plastic Surgery

## 2018-08-23 DIAGNOSIS — T8543XA Leakage of breast prosthesis and implant, initial encounter: Secondary | ICD-10-CM | POA: Diagnosis not present

## 2018-08-23 DIAGNOSIS — N641 Fat necrosis of breast: Secondary | ICD-10-CM | POA: Diagnosis not present

## 2018-08-23 DIAGNOSIS — T85828A Fibrosis due to other internal prosthetic devices, implants and grafts, initial encounter: Secondary | ICD-10-CM | POA: Diagnosis not present

## 2018-08-24 ENCOUNTER — Telehealth: Payer: Self-pay

## 2018-08-24 ENCOUNTER — Encounter: Payer: Medicare Other | Admitting: Plastic Surgery

## 2018-08-24 NOTE — Telephone Encounter (Signed)
Gel Implant device tracking for faxed to mentor 08/24/18  [facility]

## 2018-08-30 ENCOUNTER — Other Ambulatory Visit: Payer: Self-pay | Admitting: Internal Medicine

## 2018-08-31 ENCOUNTER — Ambulatory Visit: Payer: Medicare Other | Admitting: Gastroenterology

## 2018-08-31 ENCOUNTER — Other Ambulatory Visit: Payer: Self-pay

## 2018-08-31 ENCOUNTER — Telehealth (INDEPENDENT_AMBULATORY_CARE_PROVIDER_SITE_OTHER): Payer: Medicare Other | Admitting: Plastic Surgery

## 2018-08-31 DIAGNOSIS — T8543XA Leakage of breast prosthesis and implant, initial encounter: Secondary | ICD-10-CM

## 2018-08-31 NOTE — Patient Instructions (Signed)
Patient doing well and very happy.  Spoke with Megan Bradley at length.

## 2018-08-31 NOTE — Progress Notes (Signed)
Call to pt for virtual phone visit per pt's request d/t Covid-19 protocol Pt is s/p surgical remove/replace bilateral implants & capsulectomy on 08/23/18 She reports that she has no pain- only taken 2 OTC Tylenols, she denies any fever, drainage, bleeding, no redness.  Steri strips are intact She did have some mild coughing post-op, but denies any productive cough She continues to wear a compression type bra  Pt is pleased with the results of the surgery & she understands to call for any concerns or questions- she did schedule a f/u visit with Dr. Marla Roe on 09/21/18 @ 1:15pm  Doroteo Bradford

## 2018-09-20 ENCOUNTER — Telehealth: Payer: Self-pay | Admitting: Plastic Surgery

## 2018-09-20 NOTE — Telephone Encounter (Signed)
Called patient to confirm appointment scheduled for tomorrow. Patient answered the following questions: °1.Has the patient traveled outside of the state of [place] at all within the past 6 weeks? No °2.Does the patient have a fever or cough at all? No °3.Has the patient been tested for COVID? Had a positive COVID test? No °4. Has the patient been in contact with anyone who has tested positive? No ° °

## 2018-09-21 ENCOUNTER — Other Ambulatory Visit: Payer: Self-pay

## 2018-09-21 ENCOUNTER — Encounter: Payer: Self-pay | Admitting: Plastic Surgery

## 2018-09-21 ENCOUNTER — Ambulatory Visit (INDEPENDENT_AMBULATORY_CARE_PROVIDER_SITE_OTHER): Payer: Medicare Other | Admitting: Plastic Surgery

## 2018-09-21 VITALS — BP 126/65 | HR 61 | Temp 97.7°F | Ht 65.5 in | Wt 147.0 lb

## 2018-09-21 DIAGNOSIS — Z9882 Breast implant status: Secondary | ICD-10-CM

## 2018-09-21 NOTE — Progress Notes (Signed)
The patient is a 78 year old female here for follow-up after undergoing removal of ruptured breast implants and capsulectomies.  She had new implants placed.  She is extremely happy with the overall look.  She does not want any further surgery for a left.  The incisions are all healing very nicely.  There is no sign of hematoma, seroma or infection.  Follow-up 1 year and as needed.

## 2018-10-21 ENCOUNTER — Telehealth: Payer: Self-pay

## 2018-10-21 NOTE — Telephone Encounter (Signed)
Called patient from recall.  No answer. LMOV.  This is the 3rd attempt.  Will delete recall.

## 2018-10-22 ENCOUNTER — Other Ambulatory Visit: Payer: Self-pay

## 2018-10-22 ENCOUNTER — Ambulatory Visit (INDEPENDENT_AMBULATORY_CARE_PROVIDER_SITE_OTHER): Payer: Self-pay | Admitting: Plastic Surgery

## 2018-10-22 ENCOUNTER — Encounter: Payer: Self-pay | Admitting: Plastic Surgery

## 2018-10-22 VITALS — BP 131/67 | HR 67 | Temp 98.6°F | Ht 65.5 in | Wt 151.4 lb

## 2018-10-22 DIAGNOSIS — T8543XA Leakage of breast prosthesis and implant, initial encounter: Secondary | ICD-10-CM

## 2018-10-22 DIAGNOSIS — Z719 Counseling, unspecified: Secondary | ICD-10-CM

## 2018-10-22 NOTE — Progress Notes (Signed)
   Subjective:    Patient ID: Megan Bradley, female    DOB: 20-Dec-1940, 78 y.o.   MRN: [id number]  The patient is a 78 yrs old wf here for evaluation of her implants and breasts.  She had removal of ruptured implants and placement of silicone implants several months ago.  She has noticed a firmness on the upper poll of the left breast and the left implant resting higher than the right side.  The implant looks and feels stable.  There is an increase firmness in the left capsule.  There does not appear to be a rupture.  She has been doing some soft massage to the area.   Review of Systems  Constitutional: Negative.   HENT: Negative.   Eyes: Negative.   Respiratory: Negative.   Cardiovascular: Negative.   Gastrointestinal: Negative.   Genitourinary: Negative.   Musculoskeletal: Negative.   Skin: Negative.        Objective:   Physical Exam Vitals signs and nursing note reviewed.  Constitutional:      Appearance: Normal appearance.  HENT:     Head: Normocephalic and atraumatic.     Nose: Nose normal.  Cardiovascular:     Rate and Rhythm: Normal rate.  Pulmonary:     Effort: Pulmonary effort is normal.  Abdominal:     General: Abdomen is flat. There is no distension.     Tenderness: There is no abdominal tenderness.  Neurological:     General: No focal deficit present.     Mental Status: She is alert and oriented to person, place, and time.  Psychiatric:        Mood and Affect: Mood normal.        Behavior: Behavior normal.       Assessment & Plan:  Encounter for counseling continue massaging and firm palpation to the area.   Follow up in 6 - 12 months. MRI is an option if needed. Pictures taken with patient permission and placed in the chart.

## 2018-10-31 ENCOUNTER — Other Ambulatory Visit: Payer: Self-pay | Admitting: Internal Medicine

## 2018-11-24 ENCOUNTER — Other Ambulatory Visit: Payer: Self-pay | Admitting: Internal Medicine

## 2018-11-24 MED ORDER — OFLOXACIN 0.3 % OP SOLN: 1.0000 [drp] | Freq: Four times a day (QID) | OPHTHALMIC | 0 refills | Status: DC

## 2018-12-06 ENCOUNTER — Other Ambulatory Visit: Payer: Self-pay | Admitting: Internal Medicine

## 2018-12-06 MED ORDER — FAMOTIDINE 20 MG PO TABS: 20.0000 mg | ORAL_TABLET | Freq: Every day | ORAL | 0 refills | Status: DC

## 2018-12-06 MED ORDER — HYOSCYAMINE SULFATE 0.125 MG SL SUBL: 0.1250 mg | SUBLINGUAL_TABLET | SUBLINGUAL | 5 refills | Status: DC | PRN

## 2018-12-06 MED ORDER — NITROGLYCERIN 0.4 MG SL SUBL: SUBLINGUAL_TABLET | SUBLINGUAL | 3 refills | Status: DC

## 2018-12-06 MED ORDER — OMEPRAZOLE 40 MG PO CPDR: 40.0000 mg | DELAYED_RELEASE_CAPSULE | Freq: Two times a day (BID) | ORAL | 2 refills | Status: DC

## 2018-12-07 ENCOUNTER — Other Ambulatory Visit: Payer: Self-pay | Admitting: Internal Medicine

## 2018-12-07 NOTE — Progress Notes (Unsigned)
famoti

## 2018-12-26 ENCOUNTER — Other Ambulatory Visit: Payer: Self-pay | Admitting: Internal Medicine

## 2019-01-26 ENCOUNTER — Encounter: Payer: Self-pay | Admitting: Plastic Surgery

## 2019-02-01 DIAGNOSIS — D225 Melanocytic nevi of trunk: Secondary | ICD-10-CM | POA: Diagnosis not present

## 2019-02-01 DIAGNOSIS — D485 Neoplasm of uncertain behavior of skin: Secondary | ICD-10-CM | POA: Diagnosis not present

## 2019-02-01 DIAGNOSIS — Z85828 Personal history of other malignant neoplasm of skin: Secondary | ICD-10-CM | POA: Diagnosis not present

## 2019-02-01 DIAGNOSIS — C44729 Squamous cell carcinoma of skin of left lower limb, including hip: Secondary | ICD-10-CM | POA: Diagnosis not present

## 2019-02-01 DIAGNOSIS — L570 Actinic keratosis: Secondary | ICD-10-CM | POA: Diagnosis not present

## 2019-02-01 DIAGNOSIS — D0461 Carcinoma in situ of skin of right upper limb, including shoulder: Secondary | ICD-10-CM | POA: Diagnosis not present

## 2019-02-01 DIAGNOSIS — L821 Other seborrheic keratosis: Secondary | ICD-10-CM | POA: Diagnosis not present

## 2019-02-01 DIAGNOSIS — D2262 Melanocytic nevi of left upper limb, including shoulder: Secondary | ICD-10-CM | POA: Diagnosis not present

## 2019-02-06 ENCOUNTER — Other Ambulatory Visit: Payer: Self-pay | Admitting: Internal Medicine

## 2019-02-17 DIAGNOSIS — C44729 Squamous cell carcinoma of skin of left lower limb, including hip: Secondary | ICD-10-CM | POA: Diagnosis not present

## 2019-02-25 ENCOUNTER — Other Ambulatory Visit: Payer: Self-pay

## 2019-02-25 ENCOUNTER — Other Ambulatory Visit: Payer: Self-pay | Admitting: Internal Medicine

## 2019-02-25 ENCOUNTER — Ambulatory Visit (INDEPENDENT_AMBULATORY_CARE_PROVIDER_SITE_OTHER): Payer: Medicare Other

## 2019-02-25 DIAGNOSIS — E042 Nontoxic multinodular goiter: Secondary | ICD-10-CM

## 2019-02-25 DIAGNOSIS — Z23 Encounter for immunization: Secondary | ICD-10-CM

## 2019-02-25 DIAGNOSIS — R5383 Other fatigue: Secondary | ICD-10-CM

## 2019-02-25 DIAGNOSIS — E119 Type 2 diabetes mellitus without complications: Secondary | ICD-10-CM

## 2019-02-25 DIAGNOSIS — G8929 Other chronic pain: Secondary | ICD-10-CM

## 2019-02-25 NOTE — Progress Notes (Signed)
amb ref °

## 2019-02-28 ENCOUNTER — Other Ambulatory Visit: Payer: Self-pay | Admitting: Internal Medicine

## 2019-03-02 DIAGNOSIS — M170 Bilateral primary osteoarthritis of knee: Secondary | ICD-10-CM | POA: Diagnosis not present

## 2019-03-08 DIAGNOSIS — E119 Type 2 diabetes mellitus without complications: Secondary | ICD-10-CM

## 2019-03-09 DIAGNOSIS — M170 Bilateral primary osteoarthritis of knee: Secondary | ICD-10-CM | POA: Diagnosis not present

## 2019-03-11 ENCOUNTER — Other Ambulatory Visit (INDEPENDENT_AMBULATORY_CARE_PROVIDER_SITE_OTHER): Payer: Medicare Other

## 2019-03-11 ENCOUNTER — Other Ambulatory Visit: Payer: Self-pay

## 2019-03-11 DIAGNOSIS — E042 Nontoxic multinodular goiter: Secondary | ICD-10-CM

## 2019-03-11 DIAGNOSIS — E119 Type 2 diabetes mellitus without complications: Secondary | ICD-10-CM | POA: Diagnosis not present

## 2019-03-11 DIAGNOSIS — R5383 Other fatigue: Secondary | ICD-10-CM

## 2019-03-11 LAB — CBC WITH DIFFERENTIAL/PLATELET
Basophils Absolute: 0 10*3/uL (ref 0.0–0.1)
Basophils Relative: 0.3 % (ref 0.0–3.0)
Eosinophils Absolute: 0.1 10*3/uL (ref 0.0–0.7)
Eosinophils Relative: 1.1 % (ref 0.0–5.0)
HCT: 39 % (ref 36.0–46.0)
Hemoglobin: 13 g/dL (ref 12.0–15.0)
Lymphocytes Relative: 23.2 % (ref 12.0–46.0)
Lymphs Abs: 1.7 10*3/uL (ref 0.7–4.0)
MCHC: 33.2 g/dL (ref 30.0–36.0)
MCV: 91.1 fl (ref 78.0–100.0)
Monocytes Absolute: 0.6 10*3/uL (ref 0.1–1.0)
Monocytes Relative: 7.7 % (ref 3.0–12.0)
Neutro Abs: 4.9 10*3/uL (ref 1.4–7.7)
Neutrophils Relative %: 67.7 % (ref 43.0–77.0)
Platelets: 172 10*3/uL (ref 150.0–400.0)
RBC: 4.28 Mil/uL (ref 3.87–5.11)
RDW: 15.8 % — ABNORMAL HIGH (ref 11.5–15.5)
WBC: 7.2 10*3/uL (ref 4.0–10.5)

## 2019-03-11 LAB — LIPID PANEL
Cholesterol: 191 mg/dL (ref 0–200)
HDL: 74.9 mg/dL (ref >39.00)
LDL Cholesterol: 106 mg/dL — ABNORMAL HIGH (ref 0–99)
NonHDL: 116.39
Total CHOL/HDL Ratio: 3
Triglycerides: 51 mg/dL (ref 0.0–149.0)
VLDL: 10.2 mg/dL (ref 0.0–40.0)

## 2019-03-11 LAB — COMPREHENSIVE METABOLIC PANEL
ALT: 17 U/L (ref 0–35)
AST: 13 U/L (ref 0–37)
Albumin: 4.1 g/dL (ref 3.5–5.2)
Alkaline Phosphatase: 74 U/L (ref 39–117)
BUN: 21 mg/dL (ref 6–23)
CO2: 26 mEq/L (ref 19–32)
Calcium: 9.2 mg/dL (ref 8.4–10.5)
Chloride: 108 mEq/L (ref 96–112)
Creatinine, Ser: 0.95 mg/dL (ref 0.40–1.20)
GFR: 57.1 mL/min — ABNORMAL LOW (ref >60.00)
Glucose, Bld: 104 mg/dL — ABNORMAL HIGH (ref 70–99)
Potassium: 4.1 mEq/L (ref 3.5–5.1)
Sodium: 144 mEq/L (ref 135–145)
Total Bilirubin: 0.6 mg/dL (ref 0.2–1.2)
Total Protein: 6.4 g/dL (ref 6.0–8.3)

## 2019-03-11 LAB — HEMOGLOBIN A1C: Hgb A1c MFr Bld: 7.1 % — ABNORMAL HIGH (ref 4.6–6.5)

## 2019-03-11 LAB — MICROALBUMIN / CREATININE URINE RATIO
Creatinine,U: 129.7 mg/dL
Microalb Creat Ratio: 0.5 mg/g (ref 0.0–30.0)
Microalb, Ur: <0.7 mg/dL (ref 0.0–1.9)

## 2019-03-11 NOTE — Addendum Note (Signed)
Addended by: Leeanne Rio on: 03/11/2019 10:38 AM   Modules accepted: Orders

## 2019-03-11 NOTE — Addendum Note (Signed)
Addended by: Leeanne Rio on: 03/11/2019 09:24 AM   Modules accepted: Orders

## 2019-03-12 LAB — THYROID PANEL WITH TSH
Free Thyroxine Index: 2.3 (ref 1.4–3.8)
T3 Uptake: 31 % (ref 22–35)
T4, Total: 7.4 ug/dL (ref 5.1–11.9)
TSH: 3.61 mIU/L (ref 0.40–4.50)

## 2019-03-20 ENCOUNTER — Other Ambulatory Visit: Payer: Self-pay | Admitting: Internal Medicine

## 2019-03-21 NOTE — Telephone Encounter (Signed)
Refilled: 03/31/2018 Last OV: 07/02/2018 Next OV: 04/06/2019

## 2019-04-04 ENCOUNTER — Other Ambulatory Visit: Payer: Self-pay

## 2019-04-06 ENCOUNTER — Other Ambulatory Visit: Payer: Self-pay

## 2019-04-06 ENCOUNTER — Encounter: Payer: Self-pay | Admitting: Internal Medicine

## 2019-04-06 ENCOUNTER — Ambulatory Visit (INDEPENDENT_AMBULATORY_CARE_PROVIDER_SITE_OTHER): Payer: Medicare Other | Admitting: Internal Medicine

## 2019-04-06 VITALS — BP 112/62 | HR 68 | Temp 96.4°F | Resp 14 | Ht 65.5 in | Wt 146.2 lb

## 2019-04-06 DIAGNOSIS — E032 Hypothyroidism due to medicaments and other exogenous substances: Secondary | ICD-10-CM

## 2019-04-06 DIAGNOSIS — E042 Nontoxic multinodular goiter: Secondary | ICD-10-CM

## 2019-04-06 DIAGNOSIS — G8929 Other chronic pain: Secondary | ICD-10-CM

## 2019-04-06 DIAGNOSIS — E119 Type 2 diabetes mellitus without complications: Secondary | ICD-10-CM | POA: Diagnosis not present

## 2019-04-06 DIAGNOSIS — M25561 Pain in right knee: Secondary | ICD-10-CM | POA: Diagnosis not present

## 2019-04-06 DIAGNOSIS — F418 Other specified anxiety disorders: Secondary | ICD-10-CM

## 2019-04-06 DIAGNOSIS — M25562 Pain in left knee: Secondary | ICD-10-CM

## 2019-04-06 MED ORDER — BUSPIRONE HCL 5 MG PO TABS: 5.0000 mg | ORAL_TABLET | Freq: Three times a day (TID) | ORAL | 1 refills | Status: DC

## 2019-04-06 NOTE — Progress Notes (Signed)
Subjective:  Patient ID: Megan Bradley, female    DOB: 05-23-1941  Age: 78 y.o. MRN: [id number]  CC: The primary encounter diagnosis was Chronic pain of both knees. Diagnoses of Iatrogenic hypothyroidism, Diabetes mellitus without complication ([place]), Chronic pain of right knee, Multinodular goiter, and Anxiety with obsessional features were also pertinent to this visit.  HPI Megan Bradley presents for follow up on multiple issues including type 2 DM and hypthyroidisn  "I fell crappy most of the time."   Denies significant pain. walking daily . 5 lb weight  loss intentional.  Bowels are moving  Irregularly.  Then fine for 2 weeks at a time  Bilateral knee pain . Received steroid injection both knees  by [facility]  By PA Gerda Diss on medial side of left knee .  Done sequentially 1 week apart,,   Left knee 3 weeks ago  From the medial side,, left a bruise and now pain is actually worse.  Right knee was injected and feels better.   Balance is worse   Lab Results  Component Value Date   TSH 3.61 03/11/2019   8 month follow up on diabetes.  Patient has no complaints today.  Patient is following a low glycemic index diet and taking all prescribed medications regularly without side effects.  Fasting sugars have been under less than 140 most of the time and post prandials have been under 160 except on rare occasions. Patient is exercising about 3 times per week and not  intentionally trying to lose weight .  Patient has had an eye exam in the last 12 months and checks feet regularly for signs of infection.  Patient does not walk barefoot outside,  And denies an numbness tingling or burning in feet. But notes chronically poor balance.  Patient is up to date on all recommended vaccinations     Outpatient Medications Prior to Visit  Medication Sig Dispense Refill  . ALPRAZolam (XANAX) 0.5 MG tablet Take 1 tablet by mouth twice daily as needed for anxiety 60 tablet 0  . augmented betamethasone  dipropionate (DIPROLENE-AF) 0.05 % ointment APPLY TWICE DAILY TO LEGS AS NEEDED    . Cholecalciferol (VITAMIN D) 2000 UNITS CAPS Take 1 capsule by mouth daily.      Marland Kitchen diltiazem (CARDIZEM CD) 120 MG 24 hr capsule TAKE 1 CAPSULE BY MOUTH  DAILY 90 capsule 1  . ezetimibe (ZETIA) 10 MG tablet TAKE 1 TABLET BY MOUTH  DAILY 90 tablet 3  . famotidine (PEPCID) 20 MG tablet Take 1 tablet (20 mg total) by mouth at bedtime. 90 tablet 0  . glucose blood test strip For One touch Verio Flex .  Use to check 3 times daily   e11.9 100 each 0  . hyoscyamine (LEVSIN SL) 0.125 MG SL tablet Place 1 tablet (0.125 mg total) under the tongue every 4 (four) hours as needed. 30 tablet 5  . levothyroxine (SYNTHROID) 112 MCG tablet TAKE 1 TABLET BY MOUTH  DAILY BEFORE BREAKFAST 90 tablet 0  . meclizine (ANTIVERT) 25 MG tablet TAKE 1 TABLET BY MOUTH THREE TIMES DAILY AS NEEDED FOR VERTIGO    . nitroGLYCERIN (NITROSTAT) 0.4 MG SL tablet DISSOLVE ONE TABLET UNDER THE TONGUE EVERY 5 MINUTES AS NEEDED FOR CHEST PAIN.  DO NOT EXCEED A TOTAL OF 3 DOSES IN 15 MINUTES 20 tablet 3  . omeprazole (PRILOSEC) 40 MG capsule Take 1 capsule (40 mg total) by mouth 2 (two) times a day. 180 capsule 2  .  sertraline (ZOLOFT) 100 MG tablet TAKE 2 TABLETS BY MOUTH  DAILY 180 tablet 1  . ofloxacin (OCUFLOX) 0.3 % ophthalmic solution Place 1 drop into the right eye 4 (four) times daily. (Patient not taking: Reported on 04/06/2019) 5 mL 0   No facility-administered medications prior to visit.     Review of Systems;  Patient denies headache, fevers, malaise, unintentional weight loss, skin rash, eye pain, sinus congestion and sinus pain, sore throat, dysphagia,  hemoptysis , cough, dyspnea, wheezing, chest pain, palpitations, orthopnea, edema, abdominal pain, nausea, melena, diarrhea, constipation, flank pain, dysuria, hematuria, urinary  Frequency, nocturia, numbness, tingling, seizures,  Focal weakness, Loss of consciousness,  Tremor, insomnia,  depression, anxiety, and suicidal ideation.      Objective:  BP 112/62 (BP Location: Left Arm, Patient Position: Sitting, Cuff Size: Normal)   Pulse 68   Temp (!) 96.4 F (35.8 C) (Temporal)   Resp 14   Ht 5' 5.5" (1.664 m)   Wt 146 lb 3.2 oz (66.3 kg)   SpO2 98%   BMI 23.96 kg/m   BP Readings from Last 3 Encounters:  04/06/19 112/62  10/22/18 131/67  09/21/18 126/65    Wt Readings from Last 3 Encounters:  04/06/19 146 lb 3.2 oz (66.3 kg)  10/22/18 151 lb 6.4 oz (68.7 kg)  09/21/18 147 lb (66.7 kg)    General appearance: alert, cooperative and appears stated age Ears: normal TM's and external ear canals both ears Throat: lips, mucosa, and tongue normal; teeth and gums normal Neck: no adenopathy, no carotid bruit, supple, symmetrical, trachea midline and thyroid not enlarged, symmetric, no tenderness/mass/nodules Back: symmetric, no curvature. ROM normal. No CVA tenderness. Lungs: clear to auscultation bilaterally Heart: regular rate and rhythm, S1, S2 normal, no murmur, click, rub or gallop Abdomen: soft, non-tender; bowel sounds normal; no masses,  no organomegaly Pulses: 2+ and symmetric Skin: Skin color, texture, turgor normal. No rashes or lesions Lymph nodes: Cervical, supraclavicular, and axillary nodes normal.  Lab Results  Component Value Date   HGBA1C 7.1 (H) 03/11/2019   HGBA1C 6.8 (H) 07/02/2018   HGBA1C 6.9 (H) 12/30/2017    Lab Results  Component Value Date   CREATININE 0.95 03/11/2019   CREATININE 1.03 12/30/2017   CREATININE 0.98 06/17/2017    Lab Results  Component Value Date   WBC 7.2 03/11/2019   HGB 13.0 03/11/2019   HCT 39.0 03/11/2019   PLT 172.0 03/11/2019   GLUCOSE 104 (H) 03/11/2019   CHOL 191 03/11/2019   TRIG 51.0 03/11/2019   HDL 74.90 03/11/2019   LDLDIRECT 93.0 01/02/2016   LDLCALC 106 (H) 03/11/2019   ALT 17 03/11/2019   AST 13 03/11/2019   NA 144 03/11/2019   K 4.1 03/11/2019   CL 108 03/11/2019   CREATININE 0.95  03/11/2019   BUN 21 03/11/2019   CO2 26 03/11/2019   TSH 3.61 03/11/2019   INR 0.9 12/05/2011   HGBA1C 7.1 (H) 03/11/2019   MICROALBUR <0.7 03/11/2019    No results found.  Assessment & Plan:   Problem List Items Addressed This Visit      Unprioritized   Iatrogenic hypothyroidism    Thyroid function is WNL on current dose but has a little room for adjustment.  Advised to add one additonal dose of levothyroxine 112 mcg  per week on Sundays to increase weekly cumulative dose to 896 mcg (similar to 125 mcg daily x 7)  Lab Results  Component Value Date   TSH 3.61 03/11/2019  Diabetes mellitus without complication ([place])    Remains well-controlled on low GI diet.  . Patient has no microalbuminuria. Patient is tolerating statin therapy for CAD risk reduction and on ACE/ARB for renal protection and hypertension .  Lab Results  Component Value Date   HGBA1C 7.1 (H) 03/11/2019   Lab Results  Component Value Date   MICROALBUR <0.7 03/11/2019         Anxiety with obsessional features    Aggravated by COVID 19 pandemic.  Adding buspirone      Relevant Medications   busPIRone (BUSPAR) 5 MG tablet   Multinodular goiter    Serial annular ultrasounds have shown stable nodules      Knee pain, right    S/p intra articular steroid injection in early October 2020 without improvement.  Films done by [facility].  PT referral recommended for quad strengthening       Other Visit Diagnoses    Chronic pain of both knees    -  Primary   Relevant Orders   Ambulatory referral to Physical Therapy     A total of 40 minutes was spent with patient more than half of which was spent in counseling patient on the above mentioned issues , reviewing and explaining recent labs and imaging studies done, and coordination of care.  I have discontinued Vaughan Basta B. Veilleux's ofloxacin. I am also having her start on busPIRone. Additionally, I am having her maintain her Vitamin D, glucose blood,  diltiazem, augmented betamethasone dipropionate, meclizine, omeprazole, famotidine, hyoscyamine, nitroGLYCERIN, sertraline, levothyroxine, ezetimibe, and ALPRAZolam.  Meds ordered this encounter  Medications  . busPIRone (BUSPAR) 5 MG tablet    Sig: Take 1 tablet (5 mg total) by mouth 3 (three) times daily.    Dispense:  270 tablet    Refill:  1    Medications Discontinued During This Encounter  Medication Reason  . ofloxacin (OCUFLOX) 0.3 % ophthalmic solution Completed Course    Follow-up: Return in about 6 months (around 10/05/2019).   Crecencio Mc, MD

## 2019-04-06 NOTE — Patient Instructions (Addendum)
You can have your knees injected again in early February   I am ordering PT to help strengthen your quads  Adding Buspirone for your anxiety  It can be taken 2-3 times daily  And we can increase the dose gradually (every 2 weeks)  Buspirone tablets What is this medicine? BUSPIRONE (byoo SPYE rone) is used to treat anxiety disorders. This medicine may be used for other purposes; ask your health care provider or pharmacist if you have questions. COMMON BRAND NAME(S): BuSpar What should I tell my health care provider before I take this medicine? They need to know if you have any of these conditions:  kidney or liver disease  an unusual or allergic reaction to buspirone, other medicines, foods, dyes, or preservatives  pregnant or trying to get pregnant  breast-feeding How should I use this medicine? Take this medicine by mouth with a glass of water. Follow the directions on the prescription label. You may take this medicine with or without food. To ensure that this medicine always works the same way for you, you should take it either always with or always without food. Take your doses at regular intervals. Do not take your medicine more often than directed. Do not stop taking except on the advice of your doctor or health care professional. Talk to your pediatrician regarding the use of this medicine in children. Special care may be needed. Overdosage: If you think you have taken too much of this medicine contact a poison control center or emergency room at once. NOTE: This medicine is only for you. Do not share this medicine with others. What if I miss a dose? If you miss a dose, take it as soon as you can. If it is almost time for your next dose, take only that dose. Do not take double or extra doses. What may interact with this medicine? Do not take this medicine with any of the following medications:  linezolid  MAOIs like Carbex, Eldepryl, Marplan, Nardil, and Parnate  methylene  blue  procarbazine This medicine may also interact with the following medications:  diazepam  digoxin  diltiazem  erythromycin  grapefruit juice  haloperidol  medicines for mental depression or mood problems  medicines for seizures like carbamazepine, phenobarbital and phenytoin  nefazodone  other medications for anxiety  rifampin  ritonavir  some antifungal medicines like itraconazole, ketoconazole, and voriconazole  verapamil  warfarin This list may not describe all possible interactions. Give your health care provider a list of all the medicines, herbs, non-prescription drugs, or dietary supplements you use. Also tell them if you smoke, drink alcohol, or use illegal drugs. Some items may interact with your medicine. What should I watch for while using this medicine? Visit your doctor or health care professional for regular checks on your progress. It may take 1 to 2 weeks before your anxiety gets better. You may get drowsy or dizzy. Do not drive, use machinery, or do anything that needs mental alertness until you know how this drug affects you. Do not stand or sit up quickly, especially if you are an older patient. This reduces the risk of dizzy or fainting spells. Alcohol can make you more drowsy and dizzy. Avoid alcoholic drinks. What side effects may I notice from receiving this medicine? Side effects that you should report to your doctor or health care professional as soon as possible:  blurred vision or other vision changes  chest pain  confusion  difficulty breathing  feelings of hostility or anger  muscle aches and pains  numbness or tingling in hands or feet  ringing in the ears  skin rash and itching  vomiting  weakness Side effects that usually do not require medical attention (report to your doctor or health care professional if they continue or are bothersome):  disturbed dreams, nightmares  headache  nausea  restlessness or  nervousness  sore throat and nasal congestion  stomach upset This list may not describe all possible side effects. Call your doctor for medical advice about side effects. You may report side effects to FDA at 1-800-FDA-1088. Where should I keep my medicine? Keep out of the reach of children. Store at room temperature below 30 degrees C (86 degrees F). Protect from light. Keep container tightly closed. Throw away any unused medicine after the expiration date. NOTE: This sheet is a summary. It may not cover all possible information. If you have questions about this medicine, talk to your doctor, pharmacist, or health care provider.  2020 Elsevier/Gold Standard (2010-01-03 18:06:11)

## 2019-04-07 ENCOUNTER — Encounter: Payer: Self-pay | Admitting: Internal Medicine

## 2019-04-07 NOTE — Assessment & Plan Note (Signed)
Serial annular ultrasounds have shown stable nodules

## 2019-04-07 NOTE — Assessment & Plan Note (Addendum)
S/p intra articular steroid injection in early October 2020 without improvement.  Films done by [facility].  PT referral recommended for quad strengthening

## 2019-04-07 NOTE — Assessment & Plan Note (Signed)
Thyroid function is WNL on current dose but has a little room for adjustment.  Advised to add one additonal dose of levothyroxine 112 mcg  per week on Sundays to increase weekly cumulative dose to 896 mcg (similar to 125 mcg daily x 7)  Lab Results  Component Value Date   TSH 3.61 03/11/2019

## 2019-04-07 NOTE — Assessment & Plan Note (Signed)
Aggravated by COVID 19 pandemic.  Adding buspirone

## 2019-04-07 NOTE — Assessment & Plan Note (Signed)
Remains well-controlled on low GI diet.  . Patient has no microalbuminuria. Patient is tolerating statin therapy for CAD risk reduction and on ACE/ARB for renal protection and hypertension .  Lab Results  Component Value Date   HGBA1C 7.1 (H) 03/11/2019   Lab Results  Component Value Date   MICROALBUR <0.7 03/11/2019

## 2019-04-18 ENCOUNTER — Other Ambulatory Visit: Payer: Self-pay | Admitting: Internal Medicine

## 2019-04-19 ENCOUNTER — Other Ambulatory Visit: Payer: Self-pay

## 2019-04-19 ENCOUNTER — Ambulatory Visit: Payer: Medicare Other | Attending: Internal Medicine

## 2019-04-19 DIAGNOSIS — M25562 Pain in left knee: Secondary | ICD-10-CM | POA: Diagnosis not present

## 2019-04-19 DIAGNOSIS — M25561 Pain in right knee: Secondary | ICD-10-CM | POA: Diagnosis not present

## 2019-04-19 DIAGNOSIS — G8929 Other chronic pain: Secondary | ICD-10-CM | POA: Insufficient documentation

## 2019-04-19 NOTE — Therapy (Signed)
[place] [facility] [address] [place], [address] Phone: [phone]   Fax:  [phone]  Physical Therapy Evaluation  Patient Details  Name: Megan Bradley MRN: [id number] Date of Birth: Aug 16, 1940 Referring Provider (PT): Crecencio Mc, MD   Encounter Date: 04/19/2019  PT End of Session - 04/20/19 1611    Visit Number  1    Number of Visits  17    Date for PT Re-Evaluation  06/15/19    Authorization Type  1 / 10    PT Start Time  1600    PT Stop Time  1700    PT Time Calculation (min)  60 min    Activity Tolerance  Patient limited by pain    Behavior During Therapy  [facility] for tasks assessed/performed       Past Medical History:  Diagnosis Date  . Anxiety   . Anxiety disorder   . Cervical dysplasia    a. 2010 - high grade squamous intraepithelial lesion s/p excision.  . Chest pain    a. 2006 or 2007 Cath [facility]): reportedly nl cors;  b. 09/2011 ETT: nl.  . GERD (gastroesophageal reflux disease)   . Herpes zoster   . Hypothyroidism   . Orthostatic hypotension   . Pre-diabetes   . RBBB   . Vertigo   . Wears dentures    partial lower    Past Surgical History:  Procedure Laterality Date  . AUGMENTATION MAMMAPLASTY Bilateral   . BREAST EXCISIONAL BIOPSY Left   . CARDIAC CATHETERIZATION  2003   Dr.Callwood, R/L heart cath,  . COLONOSCOPY WITH PROPOFOL N/A 06/12/2017   Procedure: COLONOSCOPY WITH PROPOFOL;  Surgeon: Lucilla Lame, MD;  Location: [place];  Service: Endoscopy;  Laterality: N/A;  . ESOPHAGEAL MANOMETRY N/A 06/19/2016   Procedure: ESOPHAGEAL MANOMETRY (EM);  Surgeon: Lucilla Lame, MD;  Location: [facility];  Service: Endoscopy;  Laterality: N/A;  . ESOPHAGOGASTRODUODENOSCOPY (EGD) WITH PROPOFOL N/A 04/03/2017   Procedure: ESOPHAGOGASTRODUODENOSCOPY (EGD) WITH PROPOFOL;  Surgeon: Lucilla Lame, MD;  Location: [place];  Service: Endoscopy;  Laterality: N/A;  diabetic  - diet controlled  . THYROIDECTOMY  05/15/15   [facility]    There were no vitals filed for this visit.   Subjective Assessment - 04/20/19 1545    Subjective  Patient is a pleasant 78 yo female presenting with referral for B chronic knee pain. She is retired and enjoys reading, wine, and plans to begin a walking program to improve her overall health. She has a dog (20 lb schnauzer) which may represent a possible fall risk.    Pertinent History  Reports onset 2-3 years ago with R worsening before L. She received an injection in R (presumed cortisone) with good results. A second round of shots in R/L knees worsened her pain. Reports pain only with activity. Worst pain 5/10 R, 9/10 L. Agg: straightening L knee after prolonged flexion. Ease: activity, rest. Comorbities: orthostatic hypotension, DM. PMH of quadriceps tear in LLE, reported to be fully healed.    Limitations  Standing;Walking;House hold activities;Sitting    How long can you sit comfortably?  Indefinitely but requires UE assist for sit <> stand    How long can you stand comfortably?  Indefinitely    How long can you walk comfortably?  Cannot walk comfortably    Diagnostic tests  pt reports x-ray positive for "arthritis" in B knees    Patient Stated Goals  To be able  to walk and rise from sitting without pain    Currently in Pain?  No/denies    Aggravating Factors   Walking, rising from sitting, straightening L knee    Pain Relieving Factors  Activity, rest    Effect of Pain on Daily Activities  Limits walking         PAIN Location: R knee  0/10 current 5/10 worst 0/10 best  Irritability: low Nature: MSK Stage: chronic Stability: worse  Location: L knee  0/10 current 9/10 worst 0/10 best  Irritability: low Nature: MSK Stage: chronic Stability: worse   Agg: straighten L Ease: rest, activity (sometimes) 24-hour behavior: no change   POSTURE Standing: mild lordosis, moderate valgus knee Sitting: internal  rotation/adduction  GAIT / MOBILITY / TRANSFERS Pain with sit <> stand requires UE assist Cannot rise from floor (pt report) Stairs: requires UE support for safety ascending/descending  BALANCE Quiet standing: OK Romberg: OK Tandem stance L: severe sway requires min A for balance Tandem stance R: severe sway requires min A for balance  EC: no increase sway Head turn: no increase sway    PROM / AROM / MMT   HIP ROM  MMT    R L R L  Flex SLR WNL WNL 4 4  Ext. WNL WNL 4- 3+  ABD WNL WNL 3+ 3  ADD WNL WNL 4 4-  ER   4 4  IR   4 4    KNEE ROM  MMT    R L R L  Flex WNL WNL 4 4  Ext WNL WNL* 5 5  *Note quad lag    ANKLE ROM  MMT    R L R L  DF -4 1 5 5   PF      ! = Painful   SPECIAL TESTS Patellar grind - B McMurray's - B  PALPATION Note focal TTP 2+ and increased tissue tension in B quads TTP 3+ over inferolateral aspect of L patella TTP 0+ over joint line, pes anserine, inferior patellar pole, tibial tuberosity, and patellar ligament B   TREATMENT Instruction in SAQ x 10 Sidelying hip abd x 10   [facility] PT Assessment - 04/20/19 0001      Assessment   Medical Diagnosis  Chronic pain of both knees    Referring Provider (PT)  Crecencio Mc, MD    Onset Date/Surgical Date  06/09/16    Hand Dominance  Right    Prior Therapy  No      Precautions   Precautions  None      Restrictions   Weight Bearing Restrictions  No      Balance Screen   Has the patient fallen in the past 6 months  No    Has the patient had a decrease in activity level because of a fear of falling?   Yes    Is the patient reluctant to leave their home because of a fear of falling?   No      Home Film/video editor residence      Prior Function   Level of Independence  Independent    Vocation  Retired    Leisure  Sedentary      Cognition   Overall Cognitive Status  Within Functional Limits for tasks assessed      PT [place] - 04/20/19 1624       PT LONG TERM GOAL #1   Title  Patient will report worst pain  of 3/10 in B knees to demonstrate reduced disability with ADLs.    Baseline  5/10 R 9/10 L    Time  8    Period  Weeks    Status  New    Target Date  06/15/19      PT LONG TERM GOAL #2   Title  Patient will demonstrate gross hip strength of 4+/5 B for normalization of gait kinematics and reduced pain.    Baseline  Abd/add 3/5    Time  8    Period  Weeks    Status  New    Target Date  06/15/19      PT LONG TERM GOAL #3   Title  Patient will demonstrate ability to hold tandem stance over R and L for 10 seconds without increased postural sway for reduced fall risk with ADLs.    Baseline  Increased sway requiring min A for safety    Time  8    Period  Weeks    Status  New    Target Date  06/15/19      PT LONG TERM GOAL #4   Title  Patient will report ability to walk 10 minutes without increased pain for commencement of walking program.    Baseline  Walking increases pain immediately    Time  8    Period  Weeks    Status  New    Target Date  06/15/19      PT LONG TERM GOAL #5   Title  Patient will demonstrate rising from a chair without UE assist or increase in pain for reduced fall risk and disability with transfers.    Baseline  Requires UE assist    Time  8    Period  Weeks    Status  New    Target Date  06/15/19             Plan - 04/20/19 1614    Clinical Impression Statement  Patient is a pleasant 78 yo female presenting with referral for chronic B knee pain. Patient reports x-ray indication of OA. Note adducted/internally rotated at hip upon standing and sitting with anteroir pelvic tilt in standing and with gait. Reduced hip extension with gait consistent with measured limitations in dorsiflexion. Note significant functional weakness in hips resulting in altered gait mechanics, impaired balance, and altered pattern of loading across knee likely contributor. Note visible and palpable exostosis in  knees bilaterally. McMurray's negative but patient c/o L knee painful catch with extension after prolonged flexion consistent with articular impingement. Clinical impression: pain presentation consistent with B knee OA influenced by limitations in ankle ROM and hip/core strength. Patient will benefit from skilled physical therapy to reduce pain, reduce fall risk, and return to PLOF.    Personal Factors and Comorbidities  Age;Time since onset of injury/illness/exacerbation;Comorbidity 1    Comorbidities  orthostatic hypotension    Examination-Activity Limitations  Sit;Squat;Stairs;Stand    Examination-Participation Restrictions  Shop;Community Activity    Stability/Clinical Decision Making  Stable/Uncomplicated    Clinical Decision Making  Low    Rehab Potential  Good    PT Frequency  2x / week    PT Duration  8 weeks    PT Treatment/Interventions  Cryotherapy;Electrical Stimulation;Moist Heat;Aquatic Statistician;Therapeutic activities;Therapeutic exercise;Balance training;Neuromuscular re-education;Patient/family education;Manual techniques;Passive range of motion;Dry needling;Energy conservation;Taping;Joint Manipulations    PT Next Visit Plan  Progress therex    PT Home Exercise Plan  Quad sets, sidelying hip abd  Consulted and Agree with Plan of Care  Patient       Patient will benefit from skilled therapeutic intervention in order to improve the following deficits and impairments:  Abnormal gait, Difficulty walking, Decreased endurance, Decreased activity tolerance, Pain, Decreased balance, Improper body mechanics, Decreased mobility, Decreased strength, Postural dysfunction  Visit Diagnosis: Chronic pain of right knee  Chronic pain of left knee     Problem List Patient Active Problem List   Diagnosis Date Noted  . Ruptured left breast implant, initial encounter 07/05/2018  . Ruptured silicone breast implant 07/05/2018  . Change in bowel habits  07/04/2018  . Knee pain, right 04/11/2018  . S/P breast implant, silicone [id number]  . Osteoporosis of femur without pathological fracture 03/11/2018  . 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 Tubular adenoma of colon   . Hot flushes, perimenopausal 01/05/2016  . RBBB   . Chest pain   . Orthostatic hypotension   . Shoulder pain, left 04/10/2015  . OSA (obstructive sleep apnea) 11/22/2013  . B12 deficiency 01/11/2013  . Hypovitaminosis D 01/11/2013  . Deviated septum 01/11/2013  . Hyperlipidemia 09/04/2011  . Abnormal cells of cervix 08/27/2011  . Multinodular goiter 08/27/2011  . Iatrogenic hypothyroidism   . Diabetes mellitus without complication ([place])   . GERD (gastroesophageal reflux disease)   . Anxiety with obsessional features     Virgia Land, SPT 04/20/2019, 4:31 PM  [place] [facility] [address] [place], [address] Phone: [phone]   Fax:  [phone]  Name: Megan Bradley MRN: [id number] Date of Birth: Sep 27, 1940

## 2019-04-25 ENCOUNTER — Ambulatory Visit: Payer: Medicare Other

## 2019-04-27 ENCOUNTER — Ambulatory Visit: Payer: Medicare Other

## 2019-04-27 ENCOUNTER — Other Ambulatory Visit: Payer: Self-pay

## 2019-04-27 DIAGNOSIS — M25561 Pain in right knee: Secondary | ICD-10-CM | POA: Diagnosis not present

## 2019-04-27 DIAGNOSIS — G8929 Other chronic pain: Secondary | ICD-10-CM | POA: Diagnosis not present

## 2019-04-27 DIAGNOSIS — M25562 Pain in left knee: Secondary | ICD-10-CM | POA: Diagnosis not present

## 2019-04-27 NOTE — Therapy (Signed)
[place] [facility] [address] [place], [address] Phone: [phone]   Fax:  [phone]  Physical Therapy Treatment  Patient Details  Name: Megan Bradley MRN: [id number] Date of Birth: 03-09-1941 Referring Provider (PT): Crecencio Mc, MD   Encounter Date: 04/27/2019  PT End of Session - 04/27/19 1559    Visit Number  2    Number of Visits  17    Date for PT Re-Evaluation  06/15/19    Authorization Type  2 / 10    PT Start Time  1602    PT Stop Time  1647    PT Time Calculation (min)  45 min    Activity Tolerance  Patient limited by pain    Behavior During Therapy  [facility] for tasks assessed/performed       Past Medical History:  Diagnosis Date  . Anxiety   . Anxiety disorder   . Cervical dysplasia    a. 2010 - high grade squamous intraepithelial lesion s/p excision.  . Chest pain    a. 2006 or 2007 Cath [facility]): reportedly nl cors;  b. 09/2011 ETT: nl.  . GERD (gastroesophageal reflux disease)   . Herpes zoster   . Hypothyroidism   . Orthostatic hypotension   . Pre-diabetes   . RBBB   . Vertigo   . Wears dentures    partial lower    Past Surgical History:  Procedure Laterality Date  . AUGMENTATION MAMMAPLASTY Bilateral   . BREAST EXCISIONAL BIOPSY Left   . CARDIAC CATHETERIZATION  2003   Dr.Callwood, R/L heart cath,  . COLONOSCOPY WITH PROPOFOL N/A 06/12/2017   Procedure: COLONOSCOPY WITH PROPOFOL;  Surgeon: Lucilla Lame, MD;  Location: [place];  Service: Endoscopy;  Laterality: N/A;  . ESOPHAGEAL MANOMETRY N/A 06/19/2016   Procedure: ESOPHAGEAL MANOMETRY (EM);  Surgeon: Lucilla Lame, MD;  Location: [facility];  Service: Endoscopy;  Laterality: N/A;  . ESOPHAGOGASTRODUODENOSCOPY (EGD) WITH PROPOFOL N/A 04/03/2017   Procedure: ESOPHAGOGASTRODUODENOSCOPY (EGD) WITH PROPOFOL;  Surgeon: Lucilla Lame, MD;  Location: [place];  Service: Endoscopy;  Laterality: N/A;  diabetic -  diet controlled  . THYROIDECTOMY  05/15/15   [facility]    There were no vitals filed for this visit.  Subjective Assessment - 04/27/19 1604    Subjective  Patient reports not doing HEP secondary to esophageal pain.    Pertinent History  Reports onset 2-3 years ago with R worsening before L. She received an injection in R (presumed cortisone) with good results. A second round of shots in R/L knees worsened her pain. Reports pain only with activity. Worst pain 5/10 R, 9/10 L. Agg: straightening L knee after prolonged flexion. Ease: activity, rest. Comorbities: orthostatic hypotension, DM. PMH of quadriceps tear in LLE, reported to be fully healed.    Limitations  Standing;Walking;House hold activities;Sitting    How long can you sit comfortably?  Indefinitely but requires UE assist for sit <> stand    How long can you stand comfortably?  Indefinitely    How long can you walk comfortably?  Cannot walk comfortably    Diagnostic tests  pt reports x-ray positive for "arthritis" in B knees    Patient Stated Goals  To be able to walk and rise from sitting without pain    Currently in Pain?  No/denies    Multiple Pain Sites  No     TREATMENT  SAQ with 5 sec hold x 10 R/L Supine hip  external rotation with therapist resistance x 10 R/L Bridges x 20 cues for gluteal activation Sidelying hip abduction x 10 R/L Supine B hip abduction red theraband x 20  Seated LAQ x 20 R/L Leg press 35# x 20 cues for alignment through knee to reduce valgus. Note R foot inversion with proper knee alignment  TE for increased quad and glute strength   NMR  Moving into tandem stance x 20 with faded UE support R/L Ambulation with cues for increased stride width  NMR for reduced fall risk and hip stability with ambulation        PT Education - 04/27/19 1559    Education Details  form/technique with exercise    Person(s) Educated  Patient    Methods  Explanation;Demonstration;Tactile cues;Verbal cues     Comprehension  Verbalized understanding;Returned demonstration;Verbal cues required;Tactile cues required       PT Short Term Goals - 04/20/19 1624      PT SHORT TERM GOAL #1   Title  Patient will demonstrate adherence to HEP at least 3x/wk to speed recovery and reduce total number of visits.    Baseline  HEP given    Time  2    Period  Weeks    Status  New    Target Date  05/04/19        PT Long Term Goals - 04/20/19 1624      PT LONG TERM GOAL #1   Title  Patient will report worst pain of 3/10 in B knees to demonstrate reduced disability with ADLs.    Baseline  5/10 R 9/10 L    Time  8    Period  Weeks    Status  New    Target Date  06/15/19      PT LONG TERM GOAL #2   Title  Patient will demonstrate gross hip strength of 4+/5 B for normalization of gait kinematics and reduced pain.    Baseline  Abd/add 3/5    Time  8    Period  Weeks    Status  New    Target Date  06/15/19      PT LONG TERM GOAL #3   Title  Patient will demonstrate ability to hold tandem stance over R and L for 10 seconds without increased postural sway for reduced fall risk with ADLs.    Baseline  Increased sway requiring min A for safety    Time  8    Period  Weeks    Status  New    Target Date  06/15/19      PT LONG TERM GOAL #4   Title  Patient will report ability to walk 10 minutes without increased pain for commencement of walking program.    Baseline  Walking increases pain immediately    Time  8    Period  Weeks    Status  New    Target Date  06/15/19      PT LONG TERM GOAL #5   Title  Patient will demonstrate rising from a chair without UE assist or increase in pain for reduced fall risk and disability with transfers.    Baseline  Requires UE assist    Time  8    Period  Weeks    Status  New    Target Date  06/15/19            Plan - 04/27/19 1648    Clinical Impression Statement  Patient demonstrates good comprehension of  exercises and with frequent self-corrections for  appropriate form. Weakness evident in quads and hip gridle contributing especially to poor balance and valgus collapse with functional activity but patient able to correct with cues. Patient will benefit from skilled physical therapy to reduce pain, reduce fall risk, and return to PLOF.    Personal Factors and Comorbidities  Age;Time since onset of injury/illness/exacerbation;Comorbidity 1    Comorbidities  orthostatic hypotension    Examination-Activity Limitations  Sit;Squat;Stairs;Stand    Examination-Participation Restrictions  Shop;Community Activity    Stability/Clinical Decision Making  Stable/Uncomplicated    Rehab Potential  Good    PT Frequency  2x / week    PT Duration  8 weeks    PT Treatment/Interventions  Cryotherapy;Electrical Stimulation;Moist Heat;Aquatic Statistician;Therapeutic activities;Therapeutic exercise;Balance training;Neuromuscular re-education;Patient/family education;Manual techniques;Passive range of motion;Dry needling;Energy conservation;Taping;Joint Manipulations    PT Next Visit Plan  Progress therex    PT Home Exercise Plan  Quad sets, sidelying hip abd    Consulted and Agree with Plan of Care  Patient       Patient will benefit from skilled therapeutic intervention in order to improve the following deficits and impairments:  Abnormal gait, Difficulty walking, Decreased endurance, Decreased activity tolerance, Pain, Decreased balance, Improper body mechanics, Decreased mobility, Decreased strength, Postural dysfunction  Visit Diagnosis: Chronic pain of right knee  Chronic pain of left knee     Problem List Patient Active Problem List   Diagnosis Date Noted  . Ruptured left breast implant, initial encounter 07/05/2018  . Ruptured silicone breast implant 07/05/2018  . Change in bowel habits 07/04/2018  . Knee pain, right 04/11/2018  . S/P breast implant, silicone [id number]  . Osteoporosis of femur without pathological  fracture 03/11/2018  . 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 Tubular adenoma of colon   . Hot flushes, perimenopausal 01/05/2016  . RBBB   . Chest pain   . Orthostatic hypotension   . Shoulder pain, left 04/10/2015  . OSA (obstructive sleep apnea) 11/22/2013  . B12 deficiency 01/11/2013  . Hypovitaminosis D 01/11/2013  . Deviated septum 01/11/2013  . Hyperlipidemia 09/04/2011  . Abnormal cells of cervix 08/27/2011  . Multinodular goiter 08/27/2011  . Iatrogenic hypothyroidism   . Diabetes mellitus without complication ([place])   . GERD (gastroesophageal reflux disease)   . Anxiety with obsessional features     Virgia Land, SPT 04/27/2019, 4:57 PM  [place] [facility] [address] [place], [address] Phone: [phone]   Fax:  [phone]  Name: CRISTOL CHILCOTE MRN: [id number] Date of Birth: [birth year]

## 2019-04-28 ENCOUNTER — Other Ambulatory Visit: Payer: Self-pay | Admitting: Internal Medicine

## 2019-04-29 ENCOUNTER — Ambulatory Visit: Payer: Medicare Other | Admitting: Plastic Surgery

## 2019-05-02 ENCOUNTER — Ambulatory Visit: Payer: Medicare Other

## 2019-05-02 ENCOUNTER — Other Ambulatory Visit: Payer: Self-pay

## 2019-05-02 DIAGNOSIS — G8929 Other chronic pain: Secondary | ICD-10-CM | POA: Diagnosis not present

## 2019-05-02 DIAGNOSIS — M25562 Pain in left knee: Secondary | ICD-10-CM | POA: Diagnosis not present

## 2019-05-02 DIAGNOSIS — M25561 Pain in right knee: Secondary | ICD-10-CM | POA: Diagnosis not present

## 2019-05-02 NOTE — Therapy (Signed)
[facility] [address] [place], [address] Phone: [phone]   Fax:  [phone]  Physical Therapy Treatment  Patient Details  Name: Megan Bradley MRN: [id number] Date of Birth: Feb 08, 1941 Referring Provider (PT): Crecencio Mc, MD   Encounter Date: 05/02/2019  PT End of Session - 05/02/19 1656    Visit Number  3    Number of Visits  17    Date for PT Re-Evaluation  06/15/19    Authorization Type  3 / 10    PT Start Time  1604    PT Stop Time  1648    PT Time Calculation (min)  44 min    Activity Tolerance  Patient limited by pain    Behavior During Therapy  [facility] for tasks assessed/performed       Past Medical History:  Diagnosis Date  . Anxiety   . Anxiety disorder   . Cervical dysplasia    a. 2010 - high grade squamous intraepithelial lesion s/p excision.  . Chest pain    a. 2006 or 2007 Cath [facility]): reportedly nl cors;  b. 09/2011 ETT: nl.  . GERD (gastroesophageal reflux disease)   . Herpes zoster   . Hypothyroidism   . Orthostatic hypotension   . Pre-diabetes   . RBBB   . Vertigo   . Wears dentures    partial lower    Past Surgical History:  Procedure Laterality Date  . AUGMENTATION MAMMAPLASTY Bilateral   . BREAST EXCISIONAL BIOPSY Left   . CARDIAC CATHETERIZATION  2003   Dr.Callwood, R/L heart cath,  . COLONOSCOPY WITH PROPOFOL N/A 06/12/2017   Procedure: COLONOSCOPY WITH PROPOFOL;  Surgeon: Lucilla Lame, MD;  Location: [place];  Service: Endoscopy;  Laterality: N/A;  . ESOPHAGEAL MANOMETRY N/A 06/19/2016   Procedure: ESOPHAGEAL MANOMETRY (EM);  Surgeon: Lucilla Lame, MD;  Location: [facility];  Service: Endoscopy;  Laterality: N/A;  . ESOPHAGOGASTRODUODENOSCOPY (EGD) WITH PROPOFOL N/A 04/03/2017   Procedure: ESOPHAGOGASTRODUODENOSCOPY (EGD) WITH PROPOFOL;  Surgeon: Lucilla Lame, MD;  Location: [place];  Service: Endoscopy;  Laterality: N/A;  diabetic -  diet controlled  . THYROIDECTOMY  05/15/15   [facility]    There were no vitals filed for this visit.  Subjective Assessment - 05/02/19 1608    Subjective  Patient states she had increased pain along the L knee which she took tylenol and advil to help control the pain.    Pertinent History  Reports onset 2-3 years ago with R worsening before L. She received an injection in R (presumed cortisone) with good results. A second round of shots in R/L knees worsened her pain. Reports pain only with activity. Worst pain 5/10 R, 9/10 L. Agg: straightening L knee after prolonged flexion. Ease: activity, rest. Comorbities: orthostatic hypotension, DM. PMH of quadriceps tear in LLE, reported to be fully healed.    Limitations  Standing;Walking;House hold activities;Sitting    How long can you sit comfortably?  Indefinitely but requires UE assist for sit <> stand    How long can you stand comfortably?  Indefinitely    How long can you walk comfortably?  Cannot walk comfortably    Diagnostic tests  pt reports x-ray positive for "arthritis" in B knees    Patient Stated Goals  To be able to walk and rise from sitting without pain    Currently in Pain?  No/denies       TREATMENT Therapeutic Exercise Supine hip external  rotation with therapist resistance x 10 R/L SLR with quad set in long sitting - x 20  Seated knee flexion/extension - with ball x 20 Standing hip abduction in standing with GTB - x 20 Supine B hip abduction red theraband x 20  Leg press 35# x 20, 45# x 20 cues for alignment through knee to reduce valgus. Note R foot inversion with proper knee alignment TE for increased quad and glute strength   Manual therapy Mobilization Tib-femoral grade III to improve knee mobility and decreased knee pain - decreased pain with quad set after performing - grade IV 10 x 30sec Proximal tibial-fibular mobilizations with patient positioned in long sitting to decrease increased pain and spasms - x 20    PT  Education - 05/02/19 1655    Education Details  form/technique with exercise    Person(s) Educated  Patient    Methods  Explanation;Demonstration    Comprehension  Verbalized understanding;Returned demonstration       PT Short Term Goals - 04/20/19 1624      PT SHORT TERM GOAL #1   Title  Patient will demonstrate adherence to HEP at least 3x/wk to speed recovery and reduce total number of visits.    Baseline  HEP given    Time  2    Period  Weeks    Status  New    Target Date  05/04/19        PT Long Term Goals - 04/20/19 1624      PT LONG TERM GOAL #1   Title  Patient will report worst pain of 3/10 in B knees to demonstrate reduced disability with ADLs.    Baseline  5/10 R 9/10 L    Time  8    Period  Weeks    Status  New    Target Date  06/15/19      PT LONG TERM GOAL #2   Title  Patient will demonstrate gross hip strength of 4+/5 B for normalization of gait kinematics and reduced pain.    Baseline  Abd/add 3/5    Time  8    Period  Weeks    Status  New    Target Date  06/15/19      PT LONG TERM GOAL #3   Title  Patient will demonstrate ability to hold tandem stance over R and L for 10 seconds without increased postural sway for reduced fall risk with ADLs.    Baseline  Increased sway requiring min A for safety    Time  8    Period  Weeks    Status  New    Target Date  06/15/19      PT LONG TERM GOAL #4   Title  Patient will report ability to walk 10 minutes without increased pain for commencement of walking program.    Baseline  Walking increases pain immediately    Time  8    Period  Weeks    Status  New    Target Date  06/15/19      PT LONG TERM GOAL #5   Title  Patient will demonstrate rising from a chair without UE assist or increase in pain for reduced fall risk and disability with transfers.    Baseline  Requires UE assist    Time  8    Period  Weeks    Status  New    Target Date  06/15/19  Plan - 05/02/19 1656    Clinical  Impression Statement  Patient demonstrates increased difficulty with performing squatting motions with difficulty with performing without ankle inversion. Patient demosntrates improvement with this after cueing to perform greater hip external rotation. Patient is improving overall although she continues to have increased knee pain. Patient will benefit from further skilled therapy to return to prior level of function.    Personal Factors and Comorbidities  Age;Time since onset of injury/illness/exacerbation;Comorbidity 1    Comorbidities  orthostatic hypotension    Examination-Activity Limitations  Sit;Squat;Stairs;Stand    Examination-Participation Restrictions  Shop;Community Activity    Stability/Clinical Decision Making  Stable/Uncomplicated    Rehab Potential  Good    PT Frequency  2x / week    PT Duration  8 weeks    PT Treatment/Interventions  Cryotherapy;Electrical Stimulation;Moist Heat;Aquatic Statistician;Therapeutic activities;Therapeutic exercise;Balance training;Neuromuscular re-education;Patient/family education;Manual techniques;Passive range of motion;Dry needling;Energy conservation;Taping;Joint Manipulations    PT Next Visit Plan  Progress therex    PT Home Exercise Plan  Quad sets, sidelying hip abd    Consulted and Agree with Plan of Care  Patient       Patient will benefit from skilled therapeutic intervention in order to improve the following deficits and impairments:  Abnormal gait, Difficulty walking, Decreased endurance, Decreased activity tolerance, Pain, Decreased balance, Improper body mechanics, Decreased mobility, Decreased strength, Postural dysfunction  Visit Diagnosis: Chronic pain of left knee  Chronic pain of right knee     Problem List Patient Active Problem List   Diagnosis Date Noted  . Ruptured left breast implant, initial encounter 07/05/2018  . Ruptured silicone breast implant 07/05/2018  . Change in bowel habits  07/04/2018  . Knee pain, right 04/11/2018  . S/P breast implant, silicone [id number]  . Osteoporosis of femur without pathological fracture 03/11/2018  . 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 Tubular adenoma of colon   . Hot flushes, perimenopausal 01/05/2016  . RBBB   . Chest pain   . Orthostatic hypotension   . Shoulder pain, left 04/10/2015  . OSA (obstructive sleep apnea) 11/22/2013  . B12 deficiency 01/11/2013  . Hypovitaminosis D 01/11/2013  . Deviated septum 01/11/2013  . Hyperlipidemia 09/04/2011  . Abnormal cells of cervix 08/27/2011  . Multinodular goiter 08/27/2011  . Iatrogenic hypothyroidism   . Diabetes mellitus without complication ([place])   . GERD (gastroesophageal reflux disease)   . Anxiety with obsessional features     Blythe Stanford, PT DPT 05/02/2019, 5:04 PM  [facility] [address] [place], [address] Phone: [phone]   Fax:  [phone]  Name: Megan Bradley MRN: [id number] Date of Birth: [birth year]

## 2019-05-04 ENCOUNTER — Other Ambulatory Visit: Payer: Self-pay

## 2019-05-04 ENCOUNTER — Ambulatory Visit: Payer: Medicare Other

## 2019-05-04 DIAGNOSIS — M25561 Pain in right knee: Secondary | ICD-10-CM | POA: Diagnosis not present

## 2019-05-04 DIAGNOSIS — M25562 Pain in left knee: Secondary | ICD-10-CM | POA: Diagnosis not present

## 2019-05-04 DIAGNOSIS — G8929 Other chronic pain: Secondary | ICD-10-CM | POA: Diagnosis not present

## 2019-05-04 NOTE — Therapy (Signed)
[facility] [address], [place], [address] Phone: [phone]   Fax:  [phone]  Physical Therapy Treatment  Patient Details  Name: Megan Bradley MRN: [id number] Date of Birth: 09-10-1940 Referring Provider (PT): Crecencio Mc, MD   Encounter Date: 05/04/2019  PT End of Session - 05/04/19 1651    Visit Number  4    Number of Visits  17    Date for PT Re-Evaluation  06/15/19    Authorization Type  4 / 10    PT Start Time  [id number]    PT Stop Time  [id number]    PT Time Calculation (min)  41 min    Activity Tolerance  Patient tolerated treatment well;No increased pain    Behavior During Therapy  WFL for tasks assessed/performed       Past Medical History:  Diagnosis Date  . Anxiety   . Anxiety disorder   . Cervical dysplasia    a. 2010 - high grade squamous intraepithelial lesion s/p excision.  . Chest pain    a. 2006 or 2007 Cath [facility]): reportedly nl cors;  b. 09/2011 ETT: nl.  . GERD (gastroesophageal reflux disease)   . Herpes zoster   . Hypothyroidism   . Orthostatic hypotension   . Pre-diabetes   . RBBB   . Vertigo   . Wears dentures    partial lower    Past Surgical History:  Procedure Laterality Date  . AUGMENTATION MAMMAPLASTY Bilateral   . BREAST EXCISIONAL BIOPSY Left   . CARDIAC CATHETERIZATION  2003   Dr.Callwood, R/L heart cath,  . COLONOSCOPY WITH PROPOFOL N/A 06/12/2017   Procedure: COLONOSCOPY WITH PROPOFOL;  Surgeon: Lucilla Lame, MD;  Location: [place];  Service: Endoscopy;  Laterality: N/A;  . ESOPHAGEAL MANOMETRY N/A 06/19/2016   Procedure: ESOPHAGEAL MANOMETRY (EM);  Surgeon: Lucilla Lame, MD;  Location: [facility];  Service: Endoscopy;  Laterality: N/A;  . ESOPHAGOGASTRODUODENOSCOPY (EGD) WITH PROPOFOL N/A 04/03/2017   Procedure: ESOPHAGOGASTRODUODENOSCOPY (EGD) WITH PROPOFOL;  Surgeon: Lucilla Lame, MD;  Location: [place];  Service: Endoscopy;   Laterality: N/A;  diabetic - diet controlled  . THYROIDECTOMY  05/15/15   [facility]    There were no vitals filed for this visit.  Subjective Assessment - 05/04/19 1604    Subjective  Patient reports soreness following last session with onset the morning after. Reports she cannot pick up objects off floor.    Pertinent History  Reports onset 2-3 years ago with R worsening before L. She received an injection in R (presumed cortisone) with good results. A second round of shots in R/L knees worsened her pain. Reports pain only with activity. Worst pain 5/10 R, 9/10 L. Agg: straightening L knee after prolonged flexion. Ease: activity, rest. Comorbities: orthostatic hypotension, DM. PMH of quadriceps tear in LLE, reported to be fully healed.    Limitations  Standing;Walking;House hold activities;Sitting    How long can you sit comfortably?  Indefinitely but requires UE assist for sit <> stand    How long can you stand comfortably?  Indefinitely    How long can you walk comfortably?  Cannot walk comfortably    Diagnostic tests  pt reports x-ray positive for "arthritis" in B knees    Patient Stated Goals  To be able to walk and rise from sitting without pain    Currently in Pain?  Yes    Pain Score  7     Pain  Location  Knee    Pain Orientation  Right;Left    Pain Descriptors / Indicators  Aching;Dull    Pain Type  Acute pain    Pain Onset  In the past 7 days     TREATMENT TE Heel slides x 3 R/L - note full pain-free ROM Seated EROT x 10 R/L - note quad/hs compensation LAQ + SLR seated x 20 Leg press 45# 2 x 10 Lateral stepping 4 x 10 ft TRX squats x 5 - discontinued due to multiple compensations  MT Joint mobilization tibiofemoral A-P-A grade 3 x 30 R/L Direct distraction knee x 30 R/L  Mobilizations for increased ROM and reduced pain   NMR  Modified tandem walk 6 x 10 ft lengths Picking object off ground x 5 - note valgus collapse, pt reports lateral knee pain BLE, min A for  balance Picking object off ground with theraband cue for alignment x 10 - pt reports no pain CGA for balance Tandem stance hold 10 sec x 10 R/L min A for balance  NMR for motor repatterning and reduction of knee valgus  PT Education - 05/04/19 1651    Education Details  form/technique with exercise    Person(s) Educated  Patient    Methods  Demonstration;Explanation    Comprehension  Verbalized understanding;Returned demonstration;Verbal cues required;Tactile cues required       PT Short Term Goals - 04/20/19 1624      PT SHORT TERM GOAL #1   Title  Patient will demonstrate adherence to HEP at least 3x/wk to speed recovery and reduce total number of visits.    Baseline  HEP given    Time  2    Period  Weeks    Status  New    Target Date  05/04/19        PT Long Term Goals - 04/20/19 1624      PT LONG TERM GOAL #1   Title  Patient will report worst pain of 3/10 in B knees to demonstrate reduced disability with ADLs.    Baseline  5/10 R 9/10 L    Time  8    Period  Weeks    Status  New    Target Date  06/15/19      PT LONG TERM GOAL #2   Title  Patient will demonstrate gross hip strength of 4+/5 B for normalization of gait kinematics and reduced pain.    Baseline  Abd/add 3/5    Time  8    Period  Weeks    Status  New    Target Date  06/15/19      PT LONG TERM GOAL #3   Title  Patient will demonstrate ability to hold tandem stance over R and L for 10 seconds without increased postural sway for reduced fall risk with ADLs.    Baseline  Increased sway requiring min A for safety    Time  8    Period  Weeks    Status  New    Target Date  06/15/19      PT LONG TERM GOAL #4   Title  Patient will report ability to walk 10 minutes without increased pain for commencement of walking program.    Baseline  Walking increases pain immediately    Time  8    Period  Weeks    Status  New    Target Date  06/15/19      PT LONG TERM GOAL #5  Title  Patient will demonstrate  rising from a chair without UE assist or increase in pain for reduced fall risk and disability with transfers.    Baseline  Requires UE assist    Time  8    Period  Weeks    Status  New    Target Date  06/15/19            Plan - 05/04/19 1652    Clinical Impression Statement  Patient demonstrates sufficient ROM and strength to perform squatting motion with good alignment through first ~60 deg with cueing suggesting primarily motor patterning deficit. Pain reduced with manual therapy and cueing for improved alignment. Note improvements in tandem stance however patient remains unstable and with limited functional ROM and strength influenced by hip weakness. Patient will benefit from further skilled physical therapy to return to PLOF.    Personal Factors and Comorbidities  Age;Time since onset of injury/illness/exacerbation;Comorbidity 1    Comorbidities  orthostatic hypotension    Examination-Activity Limitations  Sit;Squat;Stairs;Stand    Examination-Participation Restrictions  Shop;Community Activity    Stability/Clinical Decision Making  Stable/Uncomplicated    Rehab Potential  Good    PT Frequency  2x / week    PT Duration  8 weeks    PT Treatment/Interventions  Cryotherapy;Electrical Stimulation;Moist Heat;Aquatic Statistician;Therapeutic activities;Therapeutic exercise;Balance training;Neuromuscular re-education;Patient/family education;Manual techniques;Passive range of motion;Dry needling;Energy conservation;Taping;Joint Manipulations    PT Next Visit Plan  Progress therex; Total Gym squats; hip posterolateral strengthening; pelvic control; TKE therex    PT Home Exercise Plan  Quad sets, sidelying hip abd    Consulted and Agree with Plan of Care  Patient       Patient will benefit from skilled therapeutic intervention in order to improve the following deficits and impairments:  Abnormal gait, Difficulty walking, Decreased endurance, Decreased activity  tolerance, Pain, Decreased balance, Improper body mechanics, Decreased mobility, Decreased strength, Postural dysfunction  Visit Diagnosis: Chronic pain of left knee  Chronic pain of right knee     Problem List Patient Active Problem List   Diagnosis Date Noted  . Ruptured left breast implant, initial encounter 07/05/2018  . Ruptured silicone breast implant 07/05/2018  . Change in bowel habits 07/04/2018  . Knee pain, right 04/11/2018  . S/P breast implant, silicone [id number]  . Osteoporosis of femur without pathological fracture 03/11/2018  . 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 Tubular adenoma of colon   . Hot flushes, perimenopausal 01/05/2016  . RBBB   . Chest pain   . Orthostatic hypotension   . Shoulder pain, left 04/10/2015  . OSA (obstructive sleep apnea) 11/22/2013  . B12 deficiency 01/11/2013  . Hypovitaminosis D 01/11/2013  . Deviated septum 01/11/2013  . Hyperlipidemia 09/04/2011  . Abnormal cells of cervix 08/27/2011  . Multinodular goiter 08/27/2011  . Iatrogenic hypothyroidism   . Diabetes mellitus without complication ([place])   . GERD (gastroesophageal reflux disease)   . Anxiety with obsessional features     Virgia Land, SPT 05/04/2019, 5:00 PM  [facility] [address] [place], [address] Phone: [phone]   Fax:  [phone]  Name: Megan Bradley MRN: [id number] Date of Birth: [birth year]

## 2019-05-09 ENCOUNTER — Ambulatory Visit: Payer: Medicare Other

## 2019-05-16 ENCOUNTER — Ambulatory Visit: Payer: Medicare Other | Attending: Internal Medicine

## 2019-05-16 ENCOUNTER — Other Ambulatory Visit: Payer: Self-pay

## 2019-05-16 DIAGNOSIS — M25561 Pain in right knee: Secondary | ICD-10-CM | POA: Insufficient documentation

## 2019-05-16 DIAGNOSIS — M25562 Pain in left knee: Secondary | ICD-10-CM | POA: Diagnosis not present

## 2019-05-16 DIAGNOSIS — G8929 Other chronic pain: Secondary | ICD-10-CM

## 2019-05-16 NOTE — Therapy (Signed)
[facility] [address] [place], [address] Phone: [phone]   Fax:  [phone]  Physical Therapy Treatment  Patient Details  Name: Megan Bradley MRN: [id number] Date of Birth: July 14, 1940 Referring Provider (PT): Crecencio Mc, MD   Encounter Date: 05/16/2019  PT End of Session - 05/16/19 1608    Visit Number  5    Number of Visits  17    Date for PT Re-Evaluation  06/15/19    Authorization Type  5 / 10    PT Start Time  [id number]    PT Stop Time  [id number]    PT Time Calculation (min)  44 min    Activity Tolerance  Patient tolerated treatment well;No increased pain    Behavior During Therapy  WFL for tasks assessed/performed       Past Medical History:  Diagnosis Date  . Anxiety   . Anxiety disorder   . Cervical dysplasia    a. 2010 - high grade squamous intraepithelial lesion s/p excision.  . Chest pain    a. 2006 or 2007 Cath [facility]): reportedly nl cors;  b. 09/2011 ETT: nl.  . GERD (gastroesophageal reflux disease)   . Herpes zoster   . Hypothyroidism   . Orthostatic hypotension   . Pre-diabetes   . RBBB   . Vertigo   . Wears dentures    partial lower    Past Surgical History:  Procedure Laterality Date  . AUGMENTATION MAMMAPLASTY Bilateral   . BREAST EXCISIONAL BIOPSY Left   . CARDIAC CATHETERIZATION  2003   Dr.Callwood, R/L heart cath,  . COLONOSCOPY WITH PROPOFOL N/A 06/12/2017   Procedure: COLONOSCOPY WITH PROPOFOL;  Surgeon: Lucilla Lame, MD;  Location: [place] [facility];  Service: Endoscopy;  Laterality: N/A;  . ESOPHAGEAL MANOMETRY N/A 06/19/2016   Procedure: ESOPHAGEAL MANOMETRY (EM);  Surgeon: Lucilla Lame, MD;  Location: [facility];  Service: Endoscopy;  Laterality: N/A;  . ESOPHAGOGASTRODUODENOSCOPY (EGD) WITH PROPOFOL N/A 04/03/2017   Procedure: ESOPHAGOGASTRODUODENOSCOPY (EGD) WITH PROPOFOL;  Surgeon: Lucilla Lame, MD;  Location: [place];  Service: Endoscopy;   Laterality: N/A;  diabetic - diet controlled  . THYROIDECTOMY  05/15/15   [facility]    There were no vitals filed for this visit.  Subjective Assessment - 05/16/19 1603    Subjective  Patient reports she does not feel that PT is helping and wants to discontinue until after holidays. Admits that she has not been doing HEP partly secondary to being busy during the holidays.    Pertinent History  B knee OA and pain. Reports onset 2-3 years ago with R worsening before L. She received an injection in R (presumed cortisone) with good results. A second round of shots in R/L knees worsened her pain. Reports pain only with activity. Worst pain 5/10 R, 9/10 L. Agg: straightening L knee after prolonged flexion. Ease: activity, rest. Comorbities: orthostatic hypotension, DM. PMH of quadriceps tear in LLE, reported to be fully healed. Reports that walking is okay but incline/decline and transfers still bother her.    Limitations  Standing;Walking;House hold activities;Sitting    How long can you sit comfortably?  Indefinitely but requires UE assist for sit <> stand    How long can you stand comfortably?  Indefinitely    How long can you walk comfortably?  Cannot walk comfortably    Diagnostic tests  pt reports x-ray positive for "arthritis" in B knees    Patient Stated Goals  To be able to walk and rise from sitting without pain    Currently in Pain?  No/denies    Pain Score  0-No pain    Pain Location  Knee    Pain Orientation  Right;Left    Pain Onset  In the past 7 days        TREATMENT  Patient reports pain with functional activity: STS = 8/10 Floor to stand = 8/10   TA Instruction in sit <> stand with focus on multimodal cues for reduction of valgus collapse, use of forward momentum with trunk flexion, and extensor power through  BLE x 15 min - pt able to rise from standard height chair 50% of the time with 0/10 pain; remaining deficits primarily lateral hip weakness and motor patterning resulting  in valgus collapse and increase load over the lateral knee resulting in pain Instruction in rising from floor with and without chair assist x 10 min - pt able to rise with chair assist 0/10 pain but not able to rise from floor without chair assist influenced by fatigue from exercise. Will revisit next session.  TE Leg press 35# x 10 55# 2 x 10 with multiple cues for alignment for improved loading through articular surfaces in tibiofemoral joint. Note mild ankle inversion compensatory for knee correction R>L, possibly secondary to ankle ROM limitations Standing hip abd x 10 R/L  Discussion regarding prognosis; pt states that she would like to come for another couple weeks.  HEP: modified sit to stand, standing hip abd        PT Education - 05/16/19 1607    Education Details  form/technique with exercise; prognosis; STS and rise from floor    Person(s) Educated  Patient    Methods  Explanation;Demonstration;Verbal cues;Tactile cues    Comprehension  Verbalized understanding;Returned demonstration;Verbal cues required;Tactile cues required       PT Short Term Goals - 04/20/19 1624      PT SHORT TERM GOAL #1   Title  Patient will demonstrate adherence to HEP at least 3x/wk to speed recovery and reduce total number of visits.    Baseline  HEP given    Time  2    Period  Weeks    Status  New    Target Date  05/04/19        PT Long Term Goals - 04/20/19 1624      PT LONG TERM GOAL #1   Title  Patient will report worst pain of 3/10 in B knees to demonstrate reduced disability with ADLs.    Baseline  5/10 R 9/10 L    Time  8    Period  Weeks    Status  New    Target Date  06/15/19      PT LONG TERM GOAL #2   Title  Patient will demonstrate gross hip strength of 4+/5 B for normalization of gait kinematics and reduced pain.    Baseline  Abd/add 3/5    Time  8    Period  Weeks    Status  New    Target Date  06/15/19      PT LONG TERM GOAL #3   Title  Patient will  demonstrate ability to hold tandem stance over R and L for 10 seconds without increased postural sway for reduced fall risk with ADLs.    Baseline  Increased sway requiring min A for safety    Time  8    Period  Weeks  Status  New    Target Date  06/15/19      PT LONG TERM GOAL #4   Title  Patient will report ability to walk 10 minutes without increased pain for commencement of walking program.    Baseline  Walking increases pain immediately    Time  8    Period  Weeks    Status  New    Target Date  06/15/19      PT LONG TERM GOAL #5   Title  Patient will demonstrate rising from a chair without UE assist or increase in pain for reduced fall risk and disability with transfers.    Baseline  Requires UE assist    Time  8    Period  Weeks    Status  New    Target Date  06/15/19            Plan - 05/16/19 1703    Clinical Impression Statement  Patient demonstrates improved motivation following instruction in functional mobility. Following treatment patient is able to perform sit <> stand from standard height chair without pain approximately 50% of the time but requires cueing. Patient demonstrates good comprehension of need to correct knee valgus for reduction in pain but is limited by weakness. Rising from floor influenced by fatigue at end of session; plan to reinforce instruction next session prior to therex. Patient will benefit from fruther skilled physical therapy to return to PLOF.    Personal Factors and Comorbidities  Age;Time since onset of injury/illness/exacerbation;Comorbidity 1    Comorbidities  orthostatic hypotension    Examination-Activity Limitations  Sit;Squat;Stairs;Stand    Examination-Participation Restrictions  Shop;Community Activity    Stability/Clinical Decision Making  Stable/Uncomplicated    Rehab Potential  Good    PT Frequency  2x / week    PT Duration  8 weeks    PT Treatment/Interventions  Cryotherapy;Electrical Stimulation;Moist Heat;Aquatic  Statistician;Therapeutic activities;Therapeutic exercise;Balance training;Neuromuscular re-education;Patient/family education;Manual techniques;Passive range of motion;Dry needling;Energy conservation;Taping;Joint Manipulations    PT Next Visit Plan  Progress therex; Total Gym squats; hip posterolateral strengthening; pelvic control; TKE therex    PT Home Exercise Plan  Quad sets, sidelying hip abd    Consulted and Agree with Plan of Care  Patient       Patient will benefit from skilled therapeutic intervention in order to improve the following deficits and impairments:  Abnormal gait, Difficulty walking, Decreased endurance, Decreased activity tolerance, Pain, Decreased balance, Improper body mechanics, Decreased mobility, Decreased strength, Postural dysfunction  Visit Diagnosis: Chronic pain of left knee  Chronic pain of right knee     Problem List Patient Active Problem List   Diagnosis Date Noted  . Ruptured left breast implant, initial encounter 07/05/2018  . Ruptured silicone breast implant 07/05/2018  . Change in bowel habits 07/04/2018  . Knee pain, right 04/11/2018  . S/P breast implant, silicone [id number]  . Osteoporosis of femur without pathological fracture 03/11/2018  . 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 Tubular adenoma of colon   . Hot flushes, perimenopausal 01/05/2016  . RBBB   . Chest pain   . Orthostatic hypotension   . Shoulder pain, left 04/10/2015  . OSA (obstructive sleep apnea) 11/22/2013  . B12 deficiency 01/11/2013  . Hypovitaminosis D 01/11/2013  . Deviated septum 01/11/2013  . Hyperlipidemia 09/04/2011  . Abnormal cells of cervix 08/27/2011  . Multinodular goiter 08/27/2011  . Iatrogenic hypothyroidism   . Diabetes mellitus without complication ([place])   . GERD (gastroesophageal reflux disease)   .  Anxiety with obsessional features     Virgia Land, SPT 05/16/2019, 5:16 PM  [facility]  [facility] [address], [place], [address] Phone: [phone]   Fax:  [phone]  Name: YMANI THAEMERT MRN: [id number] Date of Birth: 15-Oct-1940

## 2019-05-18 ENCOUNTER — Ambulatory Visit: Payer: Medicare Other

## 2019-05-18 ENCOUNTER — Other Ambulatory Visit: Payer: Self-pay

## 2019-05-18 DIAGNOSIS — M25561 Pain in right knee: Secondary | ICD-10-CM | POA: Diagnosis not present

## 2019-05-18 DIAGNOSIS — G8929 Other chronic pain: Secondary | ICD-10-CM | POA: Diagnosis not present

## 2019-05-18 DIAGNOSIS — M25562 Pain in left knee: Secondary | ICD-10-CM

## 2019-05-18 NOTE — Therapy (Signed)
[facility] [address], [place], [address] Phone: [phone]   Fax:  [phone]  Physical Therapy Treatment  Patient Details  Name: Megan Bradley MRN: [id number] Date of Birth: 1940-06-10 Referring Provider (PT): Crecencio Mc, MD   Encounter Date: 05/18/2019  PT End of Session - 05/18/19 1623    Visit Number  6    Number of Visits  17    Date for PT Re-Evaluation  06/15/19    Authorization Type  6 / 10    PT Start Time  1605    PT Stop Time  [id number]    PT Time Calculation (min)  45 min    Activity Tolerance  Patient tolerated treatment well;No increased pain    Behavior During Therapy  WFL for tasks assessed/performed       Past Medical History:  Diagnosis Date  . Anxiety   . Anxiety disorder   . Cervical dysplasia    a. 2010 - high grade squamous intraepithelial lesion s/p excision.  . Chest pain    a. 2006 or 2007 Cath [facility]): reportedly nl cors;  b. 09/2011 ETT: nl.  . GERD (gastroesophageal reflux disease)   . Herpes zoster   . Hypothyroidism   . Orthostatic hypotension   . Pre-diabetes   . RBBB   . Vertigo   . Wears dentures    partial lower    Past Surgical History:  Procedure Laterality Date  . AUGMENTATION MAMMAPLASTY Bilateral   . BREAST EXCISIONAL BIOPSY Left   . CARDIAC CATHETERIZATION  2003   Dr.Callwood, R/L heart cath,  . COLONOSCOPY WITH PROPOFOL N/A 06/12/2017   Procedure: COLONOSCOPY WITH PROPOFOL;  Surgeon: Lucilla Lame, MD;  Location: [place];  Service: Endoscopy;  Laterality: N/A;  . ESOPHAGEAL MANOMETRY N/A 06/19/2016   Procedure: ESOPHAGEAL MANOMETRY (EM);  Surgeon: Lucilla Lame, MD;  Location: [facility];  Service: Endoscopy;  Laterality: N/A;  . ESOPHAGOGASTRODUODENOSCOPY (EGD) WITH PROPOFOL N/A 04/03/2017   Procedure: ESOPHAGOGASTRODUODENOSCOPY (EGD) WITH PROPOFOL;  Surgeon: Lucilla Lame, MD;  Location: [place];  Service: Endoscopy;   Laterality: N/A;  diabetic - diet controlled  . THYROIDECTOMY  05/15/15   [facility]    There were no vitals filed for this visit.  Subjective Assessment - 05/18/19 1609    Subjective  Patient reports she has been practicing the performance of her sit to stands. Patient reports she has been able to stand without the use of her hands.    Pertinent History  B knee OA and pain. Reports onset 2-3 years ago with R worsening before L. She received an injection in R (presumed cortisone) with good results. A second round of shots in R/L knees worsened her pain. Reports pain only with activity. Worst pain 5/10 R, 9/10 L. Agg: straightening L knee after prolonged flexion. Ease: activity, rest. Comorbities: orthostatic hypotension, DM. PMH of quadriceps tear in LLE, reported to be fully healed. Reports that walking is okay but incline/decline and transfers still bother her.    Limitations  Standing;Walking;House hold activities;Sitting    How long can you sit comfortably?  Indefinitely but requires UE assist for sit <> stand    How long can you stand comfortably?  Indefinitely    How long can you walk comfortably?  Cannot walk comfortably    Diagnostic tests  pt reports x-ray positive for "arthritis" in B knees    Patient Stated Goals  To be able to walk and  rise from sitting without pain    Currently in Pain?  No/denies    Pain Onset  In the past 7 days       TREATMENT Therapeutic Exercise TE Leg press 55# x 10 65# 2 x 10; SL: x15 25# with multiple cues for alignment for improved loading through articular surfaces in tibiofemoral joint. Note mild ankle inversion compensatory for knee correction R>L, possibly secondary to ankle ROM limitations Standing hip abd 2 x 10 R/L RTB Standing hip ext  2 x 10 R/L YTB Running man throughout decreased AROM in standing - x 20 SLS without UE support - 3 x 30sec    Performed exercises to address LE weakness   PT Education - 05/18/19 1622    Education Details   form/technique with exercise; Hip abduction/extension  with YTB, STS    Person(s) Educated  Patient    Methods  Explanation;Demonstration    Comprehension  Verbalized understanding;Returned demonstration       PT Short Term Goals - 04/20/19 1624      PT SHORT TERM GOAL #1   Title  Patient will demonstrate adherence to HEP at least 3x/wk to speed recovery and reduce total number of visits.    Baseline  HEP given    Time  2    Period  Weeks    Status  New    Target Date  05/04/19        PT Long Term Goals - 04/20/19 1624      PT LONG TERM GOAL #1   Title  Patient will report worst pain of 3/10 in B knees to demonstrate reduced disability with ADLs.    Baseline  5/10 R 9/10 L    Time  8    Period  Weeks    Status  New    Target Date  06/15/19      PT LONG TERM GOAL #2   Title  Patient will demonstrate gross hip strength of 4+/5 B for normalization of gait kinematics and reduced pain.    Baseline  Abd/add 3/5    Time  8    Period  Weeks    Status  New    Target Date  06/15/19      PT LONG TERM GOAL #3   Title  Patient will demonstrate ability to hold tandem stance over R and L for 10 seconds without increased postural sway for reduced fall risk with ADLs.    Baseline  Increased sway requiring min A for safety    Time  8    Period  Weeks    Status  New    Target Date  06/15/19      PT LONG TERM GOAL #4   Title  Patient will report ability to walk 10 minutes without increased pain for commencement of walking program.    Baseline  Walking increases pain immediately    Time  8    Period  Weeks    Status  New    Target Date  06/15/19      PT LONG TERM GOAL #5   Title  Patient will demonstrate rising from a chair without UE assist or increase in pain for reduced fall risk and disability with transfers.    Baseline  Requires UE assist    Time  8    Period  Weeks    Status  New    Target Date  06/15/19  Plan - 05/18/19 1701    Clinical Impression  Statement  Patient demonstrates good carryover between session with knee positioning during exercises. Continued to address quad strengthening and performing greater hip ER during motion. Patient is improving overall with ability to perform squats throuhgout a greater AROM compared to previous sessions. Patient continues to have difficulty with SLS most notably with bending motions. Patient will benefit from further skilled therapy to return to prior level of function.    Personal Factors and Comorbidities  Age;Time since onset of injury/illness/exacerbation;Comorbidity 1    Comorbidities  orthostatic hypotension    Examination-Activity Limitations  Sit;Squat;Stairs;Stand    Examination-Participation Restrictions  Shop;Community Activity    Stability/Clinical Decision Making  Stable/Uncomplicated    Rehab Potential  Good    PT Frequency  2x / week    PT Duration  8 weeks    PT Treatment/Interventions  Cryotherapy;Electrical Stimulation;Moist Heat;Aquatic Statistician;Therapeutic activities;Therapeutic exercise;Balance training;Neuromuscular re-education;Patient/family education;Manual techniques;Passive range of motion;Dry needling;Energy conservation;Taping;Joint Manipulations    PT Next Visit Plan  Progress therex; Total Gym squats; hip posterolateral strengthening; pelvic control; TKE therex    PT Home Exercise Plan  Quad sets, sidelying hip abd    Consulted and Agree with Plan of Care  Patient       Patient will benefit from skilled therapeutic intervention in order to improve the following deficits and impairments:  Abnormal gait, Difficulty walking, Decreased endurance, Decreased activity tolerance, Pain, Decreased balance, Improper body mechanics, Decreased mobility, Decreased strength, Postural dysfunction  Visit Diagnosis: Chronic pain of left knee  Chronic pain of right knee     Problem List Patient Active Problem List   Diagnosis Date Noted  . Ruptured  left breast implant, initial encounter 07/05/2018  . Ruptured silicone breast implant 07/05/2018  . Change in bowel habits 07/04/2018  . Knee pain, right 04/11/2018  . S/P breast implant, silicone [id number]  . Osteoporosis of femur without pathological fracture 03/11/2018  . 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 Tubular adenoma of colon   . Hot flushes, perimenopausal 01/05/2016  . RBBB   . Chest pain   . Orthostatic hypotension   . Shoulder pain, left 04/10/2015  . OSA (obstructive sleep apnea) 11/22/2013  . B12 deficiency 01/11/2013  . Hypovitaminosis D 01/11/2013  . Deviated septum 01/11/2013  . Hyperlipidemia 09/04/2011  . Abnormal cells of cervix 08/27/2011  . Multinodular goiter 08/27/2011  . Iatrogenic hypothyroidism   . Diabetes mellitus without complication ([place])   . GERD (gastroesophageal reflux disease)   . Anxiety with obsessional features     Blythe Stanford, PT DPT 05/18/2019, 5:05 PM  [facility] [address], [place], [address] Phone: [phone]   Fax:  [phone]  Name: RIYAN FADNESS MRN: [id number] Date of Birth: 1941-02-23

## 2019-05-23 ENCOUNTER — Ambulatory Visit: Payer: Medicare Other

## 2019-05-25 ENCOUNTER — Ambulatory Visit: Payer: Medicare Other

## 2019-05-25 ENCOUNTER — Other Ambulatory Visit: Payer: Self-pay

## 2019-05-25 DIAGNOSIS — G8929 Other chronic pain: Secondary | ICD-10-CM

## 2019-05-25 DIAGNOSIS — M25562 Pain in left knee: Secondary | ICD-10-CM | POA: Diagnosis not present

## 2019-05-25 DIAGNOSIS — M25561 Pain in right knee: Secondary | ICD-10-CM

## 2019-05-25 NOTE — Therapy (Signed)
[facility] [address], [place], [address] Phone: [phone]   Fax:  [phone]  Physical Therapy Treatment  Patient Details  Name: Megan Bradley MRN: [id number] Date of Birth: 01/02/1941 Referring Provider (PT): Crecencio Mc, MD   Encounter Date: 05/25/2019  PT End of Session - 05/25/19 1651    Visit Number  7    Number of Visits  17    Date for PT Re-Evaluation  06/15/19    Authorization Type  7 / 10    PT Start Time  [id number]    PT Stop Time  [id number]    PT Time Calculation (min)  41 min    Activity Tolerance  Patient tolerated treatment well;No increased pain    Behavior During Therapy  WFL for tasks assessed/performed       Past Medical History:  Diagnosis Date  . Anxiety   . Anxiety disorder   . Cervical dysplasia    a. 2010 - high grade squamous intraepithelial lesion s/p excision.  . Chest pain    a. 2006 or 2007 Cath [facility]): reportedly nl cors;  b. 09/2011 ETT: nl.  . GERD (gastroesophageal reflux disease)   . Herpes zoster   . Hypothyroidism   . Orthostatic hypotension   . Pre-diabetes   . RBBB   . Vertigo   . Wears dentures    partial lower    Past Surgical History:  Procedure Laterality Date  . AUGMENTATION MAMMAPLASTY Bilateral   . BREAST EXCISIONAL BIOPSY Left   . CARDIAC CATHETERIZATION  2003   Dr.Callwood, R/L heart cath,  . COLONOSCOPY WITH PROPOFOL N/A 06/12/2017   Procedure: COLONOSCOPY WITH PROPOFOL;  Surgeon: Lucilla Lame, MD;  Location: [facility];  Service: Endoscopy;  Laterality: N/A;  . ESOPHAGEAL MANOMETRY N/A 06/19/2016   Procedure: ESOPHAGEAL MANOMETRY (EM);  Surgeon: Lucilla Lame, MD;  Location: [facility];  Service: Endoscopy;  Laterality: N/A;  . ESOPHAGOGASTRODUODENOSCOPY (EGD) WITH PROPOFOL N/A 04/03/2017   Procedure: ESOPHAGOGASTRODUODENOSCOPY (EGD) WITH PROPOFOL;  Surgeon: Lucilla Lame, MD;  Location: [facility];  Service: Endoscopy;   Laterality: N/A;  diabetic - diet controlled  . THYROIDECTOMY  05/15/15   [facility]    There were no vitals filed for this visit.  Subjective Assessment - 05/25/19 1602    Subjective  Patient reports that knee is no thurting as much although it's still difficult for her to get up.    Pertinent History  B knee OA and pain. Reports onset 2-3 years ago with R worsening before L. She received an injection in R (presumed cortisone) with good results. A second round of shots in R/L knees worsened her pain. Reports pain only with activity. Worst pain 5/10 R, 9/10 L. Agg: straightening L knee after prolonged flexion. Ease: activity, rest. Comorbities: orthostatic hypotension, DM. PMH of quadriceps tear in LLE, reported to be fully healed. Reports that walking is okay but incline/decline and transfers still bother her.    Limitations  Standing;Walking;House hold activities;Sitting    How long can you sit comfortably?  Indefinitely but requires UE assist for sit <> stand    How long can you stand comfortably?  Indefinitely    How long can you walk comfortably?  Cannot walk comfortably    Diagnostic tests  pt reports x-ray positive for "arthritis" in B knees    Patient Stated Goals  To be able to walk and rise from sitting without pain    Pain  Score  4     Pain Location  Knee    Pain Orientation  Right;Left    Pain Descriptors / Indicators  Aching;Dull    Pain Onset  In the past 7 days        TREATMENT  TA  Getting up off floor x 3 - pt demonstrates quadruped > feet flat quadruped > walking hands back to stand. Pt reports no pain after three repetitions  TE STS with band around knees for improved gluteal stabilization x 5 - adductor compensation despite band; increased seat height with pillow to avoid Lateral walks with green TB around knees > around ankles 10 ft x 6 TG @ level 15 with therapist cues for knee stabilization Step ups R/L x 15 with cues for gluteal activation for joint  protection  NMR  Tandem stance x 10 sec R/L - pt able to achieve after 4 attempts over L, after 7 attempts over R Tandem walk 10 ft x 8 - multiple LOB requiring step out to correct but markedly improved compared to evaluation SLS marches with 3 sec hold with faded UE support x 10 R/L - added to HEP   EDUCATION: Performance of hip ER with squatting motions and knee extension based activities utilizing tactile and verbal cueing to perform   PT Short Term Goals - 04/20/19 1624      PT SHORT TERM GOAL #1   Title  Patient will demonstrate adherence to HEP at least 3x/wk to speed recovery and reduce total number of visits.    Baseline  HEP given    Time  2    Period  Weeks    Status  New    Target Date  05/04/19        PT Long Term Goals - 04/20/19 1624      PT LONG TERM GOAL #1   Title  Patient will report worst pain of 3/10 in B knees to demonstrate reduced disability with ADLs.    Baseline  5/10 R 9/10 L    Time  8    Period  Weeks    Status  New    Target Date  06/15/19      PT LONG TERM GOAL #2   Title  Patient will demonstrate gross hip strength of 4+/5 B for normalization of gait kinematics and reduced pain.    Baseline  Abd/add 3/5    Time  8    Period  Weeks    Status  New    Target Date  06/15/19      PT LONG TERM GOAL #3   Title  Patient will demonstrate ability to hold tandem stance over R and L for 10 seconds without increased postural sway for reduced fall risk with ADLs.    Baseline  Increased sway requiring min A for safety    Time  8    Period  Weeks    Status  New    Target Date  06/15/19      PT LONG TERM GOAL #4   Title  Patient will report ability to walk 10 minutes without increased pain for commencement of walking program.    Baseline  Walking increases pain immediately    Time  8    Period  Weeks    Status  New    Target Date  06/15/19      PT LONG TERM GOAL #5   Title  Patient will demonstrate rising from a chair without UE assist  or  increase in pain for reduced fall risk and disability with transfers.    Baseline  Requires UE assist    Time  8    Period  Weeks    Status  New    Target Date  06/15/19            Plan - 05/25/19 1651    Clinical Impression Statement  Patient demonstrates pain-free rising from floor moving through quadruped rather than half-kneeling for reduced stress on medial knees, representing improvement from prior session when this could not be accomplished without pain. Note improved lateral hip stabilization evidenced by improved tandem stance and tandem walk. Adductor compensation/knee valgus remains with knee flexion to 90 deg. and during step-up exercise. Patient will benefit from further skiled therapy to return to PLOF.    Personal Factors and Comorbidities  Age;Time since onset of injury/illness/exacerbation;Comorbidity 1    Comorbidities  orthostatic hypotension    Examination-Activity Limitations  Sit;Squat;Stairs;Stand    Examination-Participation Restrictions  Shop;Community Activity    Stability/Clinical Decision Making  Stable/Uncomplicated    Rehab Potential  Good    PT Frequency  2x / week    PT Duration  8 weeks    PT Treatment/Interventions  Cryotherapy;Electrical Stimulation;Moist Heat;Aquatic Statistician;Therapeutic activities;Therapeutic exercise;Balance training;Neuromuscular re-education;Patient/family education;Manual techniques;Passive range of motion;Dry needling;Energy conservation;Taping;Joint Manipulations    PT Next Visit Plan  Progress therex; Total Gym squats; hip posterolateral strengthening; pelvic control; TKE therex    PT Home Exercise Plan  Quad sets, sidelying hip abd    Consulted and Agree with Plan of Care  Patient       Patient will benefit from skilled therapeutic intervention in order to improve the following deficits and impairments:  Abnormal gait, Difficulty walking, Decreased endurance, Decreased activity tolerance, Pain,  Decreased balance, Improper body mechanics, Decreased mobility, Decreased strength, Postural dysfunction  Visit Diagnosis: Chronic pain of left knee  Chronic pain of right knee     Problem List Patient Active Problem List   Diagnosis Date Noted  . Ruptured left breast implant, initial encounter 07/05/2018  . Ruptured silicone breast implant 07/05/2018  . Change in bowel habits 07/04/2018  . Knee pain, right 04/11/2018  . S/P breast implant, silicone [id number]  . Osteoporosis of femur without pathological fracture 03/11/2018  . 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 Tubular adenoma of colon   . Hot flushes, perimenopausal 01/05/2016  . RBBB   . Chest pain   . Orthostatic hypotension   . Shoulder pain, left 04/10/2015  . OSA (obstructive sleep apnea) 11/22/2013  . B12 deficiency 01/11/2013  . Hypovitaminosis D 01/11/2013  . Deviated septum 01/11/2013  . Hyperlipidemia 09/04/2011  . Abnormal cells of cervix 08/27/2011  . Multinodular goiter 08/27/2011  . Iatrogenic hypothyroidism   . Diabetes mellitus without complication ([place])   . GERD (gastroesophageal reflux disease)   . Anxiety with obsessional features     Virgia Land, SPT 05/25/2019, 4:55 PM  [facility] [address] [place], [address] Phone: [phone]   Fax:  [phone]  Name: Megan Bradley MRN: [id number] Date of Birth: 05-Jan-1941

## 2019-05-30 ENCOUNTER — Ambulatory Visit: Payer: Medicare Other

## 2019-05-30 ENCOUNTER — Other Ambulatory Visit: Payer: Self-pay

## 2019-05-30 ENCOUNTER — Encounter: Payer: Self-pay | Admitting: Plastic Surgery

## 2019-05-30 ENCOUNTER — Ambulatory Visit (INDEPENDENT_AMBULATORY_CARE_PROVIDER_SITE_OTHER): Payer: Medicare Other | Admitting: Plastic Surgery

## 2019-05-30 VITALS — BP 129/98 | HR 74 | Temp 97.1°F | Ht 66.0 in | Wt 149.2 lb

## 2019-05-30 DIAGNOSIS — M25561 Pain in right knee: Secondary | ICD-10-CM | POA: Diagnosis not present

## 2019-05-30 DIAGNOSIS — T8543XA Leakage of breast prosthesis and implant, initial encounter: Secondary | ICD-10-CM | POA: Diagnosis not present

## 2019-05-30 DIAGNOSIS — M25562 Pain in left knee: Secondary | ICD-10-CM

## 2019-05-30 DIAGNOSIS — G8929 Other chronic pain: Secondary | ICD-10-CM | POA: Diagnosis not present

## 2019-05-30 DIAGNOSIS — Z9882 Breast implant status: Secondary | ICD-10-CM

## 2019-05-30 NOTE — Therapy (Signed)
[facility] [address], [place], [address] Phone: [phone]   Fax:  [phone]  Physical Therapy Treatment  Patient Details  Name: Megan Bradley MRN: [id number] Date of Birth: 1941/01/27 Referring Provider (PT): Crecencio Mc, MD   Encounter Date: 05/30/2019  PT End of Session - 05/30/19 1645    Visit Number  8    Number of Visits  17    Date for PT Re-Evaluation  06/15/19    Authorization Type  8 / 10    PT Start Time  1606    PT Stop Time  [id number]    PT Time Calculation (min)  39 min    Activity Tolerance  Patient tolerated treatment well;No increased pain    Behavior During Therapy  WFL for tasks assessed/performed       Past Medical History:  Diagnosis Date  . Anxiety   . Anxiety disorder   . Cervical dysplasia    a. 2010 - high grade squamous intraepithelial lesion s/p excision.  . Chest pain    a. 2006 or 2007 Cath [facility]): reportedly nl cors;  b. 09/2011 ETT: nl.  . GERD (gastroesophageal reflux disease)   . Herpes zoster   . Hypothyroidism   . Orthostatic hypotension   . Pre-diabetes   . RBBB   . Vertigo   . Wears dentures    partial lower    Past Surgical History:  Procedure Laterality Date  . AUGMENTATION MAMMAPLASTY Bilateral   . BREAST EXCISIONAL BIOPSY Left   . CARDIAC CATHETERIZATION  2003   Dr.Callwood, R/L heart cath,  . COLONOSCOPY WITH PROPOFOL N/A 06/12/2017   Procedure: COLONOSCOPY WITH PROPOFOL;  Surgeon: Lucilla Lame, MD;  Location: [facility];  Service: Endoscopy;  Laterality: N/A;  . ESOPHAGEAL MANOMETRY N/A 06/19/2016   Procedure: ESOPHAGEAL MANOMETRY (EM);  Surgeon: Lucilla Lame, MD;  Location: [facility];  Service: Endoscopy;  Laterality: N/A;  . ESOPHAGOGASTRODUODENOSCOPY (EGD) WITH PROPOFOL N/A 04/03/2017   Procedure: ESOPHAGOGASTRODUODENOSCOPY (EGD) WITH PROPOFOL;  Surgeon: Lucilla Lame, MD;  Location: [facility];  Service: Endoscopy;   Laterality: N/A;  diabetic - diet controlled  . THYROIDECTOMY  05/15/15   [facility]    There were no vitals filed for this visit.  Subjective Assessment - 05/30/19 1607    Subjective  Patient reports taking three advil and feeling great. Reports onset of LBP with lifting earlier today.    Pertinent History  B knee OA and pain. Reports onset 2-3 years ago with R worsening before L. She received an injection in R (presumed cortisone) with good results. A second round of shots in R/L knees worsened her pain. Reports pain only with activity. Worst pain 5/10 R, 9/10 L. Agg: straightening L knee after prolonged flexion. Ease: activity, rest. Comorbities: orthostatic hypotension, DM. PMH of quadriceps tear in LLE, reported to be fully healed. Reports that walking is okay but incline/decline and transfers still bother her.    Limitations  Standing;Walking;House hold activities;Sitting    How long can you sit comfortably?  Indefinitely but requires UE assist for sit <> stand    How long can you stand comfortably?  Indefinitely    How long can you walk comfortably?  Cannot walk comfortably    Diagnostic tests  pt reports x-ray positive for "arthritis" in B knees    Patient Stated Goals  To be able to walk and rise from sitting without pain    Currently in Pain?  No/denies    Pain Onset  In the past 7 days        TREATMENT  TE Step ups 6" x 20 R/L note valgus L>R Lateral step ups 4" x 20 R/L with cues for exercise technique - note improved lateral/medial stability Quad TKE in standing 4" step x 20 R/L with mirror feedback to correct valgus collapse Pelvic tilts seated x 15 with cues for target muscle activation and core stability Low back stretch seated x 30 sec Supine pelvic rocking x 20 with tactile cues for improved target muscle activation Segmental bridges x 20 note inversion ankle compensation  NMR  SLS holds multiple trials 9 sec R 9 sec L Ball toss in [place] on airex foam x 30 tosses to  midline and outside BOS Tandem walk 10 ft x 8 - multiple LOB but reduced with practice     PT Education - 05/30/19 1748    Education Details  form/technique with exercise    Person(s) Educated  Patient    Methods  Explanation;Demonstration;Verbal cues;Tactile cues    Comprehension  Verbalized understanding;Returned demonstration;Verbal cues required;Tactile cues required       PT Short Term Goals - 04/20/19 1624      PT SHORT TERM GOAL #1   Title  Patient will demonstrate adherence to HEP at least 3x/wk to speed recovery and reduce total number of visits.    Baseline  HEP given    Time  2    Period  Weeks    Status  New    Target Date  05/04/19        PT Long Term Goals - 04/20/19 1624      PT LONG TERM GOAL #1   Title  Patient will report worst pain of 3/10 in B knees to demonstrate reduced disability with ADLs.    Baseline  5/10 R 9/10 L    Time  8    Period  Weeks    Status  New    Target Date  06/15/19      PT LONG TERM GOAL #2   Title  Patient will demonstrate gross hip strength of 4+/5 B for normalization of gait kinematics and reduced pain.    Baseline  Abd/add 3/5    Time  8    Period  Weeks    Status  New    Target Date  06/15/19      PT LONG TERM GOAL #3   Title  Patient will demonstrate ability to hold tandem stance over R and L for 10 seconds without increased postural sway for reduced fall risk with ADLs.    Baseline  Increased sway requiring min A for safety    Time  8    Period  Weeks    Status  New    Target Date  06/15/19      PT LONG TERM GOAL #4   Title  Patient will report ability to walk 10 minutes without increased pain for commencement of walking program.    Baseline  Walking increases pain immediately    Time  8    Period  Weeks    Status  New    Target Date  06/15/19      PT LONG TERM GOAL #5   Title  Patient will demonstrate rising from a chair without UE assist or increase in pain for reduced fall risk and disability with  transfers.    Baseline  Requires UE assist    Time  8    Period  Weeks    Status  New    Target Date  06/15/19            Plan - 05/30/19 1749    Clinical Impression Statement  Patient reports no knee pain with activity. Notable gains in SLS stability with step up exercises and balance exercises. Patient continues to be challenged with tandem stance balance activities demonstrating deficits in ankle stability R>L and core stability contributing to abnormal loading through B knees. Plan to incorporate pelvic dissociation and core strengthening exercises. Patient will benefit form skilled physical therapy to return to PLOF.    Personal Factors and Comorbidities  Age;Time since onset of injury/illness/exacerbation;Comorbidity 1    Comorbidities  orthostatic hypotension    Examination-Activity Limitations  Sit;Squat;Stairs;Stand    Examination-Participation Restrictions  Shop;Community Activity    Stability/Clinical Decision Making  Stable/Uncomplicated    Rehab Potential  Good    PT Frequency  2x / week    PT Duration  8 weeks    PT Treatment/Interventions  Cryotherapy;Electrical Stimulation;Moist Heat;Aquatic Statistician;Therapeutic activities;Therapeutic exercise;Balance training;Neuromuscular re-education;Patient/family education;Manual techniques;Passive range of motion;Dry needling;Energy conservation;Taping;Joint Manipulations    PT Next Visit Plan  Hip posterolateral strengthening; pelvic dissociation and core therex    PT Home Exercise Plan  SLS holds, tandem walk    Consulted and Agree with Plan of Care  Patient       Patient will benefit from skilled therapeutic intervention in order to improve the following deficits and impairments:  Abnormal gait, Difficulty walking, Decreased endurance, Decreased activity tolerance, Pain, Decreased balance, Improper body mechanics, Decreased mobility, Decreased strength, Postural dysfunction  Visit  Diagnosis: Chronic pain of left knee  Chronic pain of right knee     Problem List Patient Active Problem List   Diagnosis Date Noted  . Ruptured left breast implant, initial encounter 07/05/2018  . Ruptured silicone breast implant 07/05/2018  . Change in bowel habits 07/04/2018  . Knee pain, right 04/11/2018  . S/P breast implant, silicone [id number]  . Osteoporosis of femur without pathological fracture 03/11/2018  . 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 Tubular adenoma of colon   . Hot flushes, perimenopausal 01/05/2016  . RBBB   . Chest pain   . Orthostatic hypotension   . Shoulder pain, left 04/10/2015  . OSA (obstructive sleep apnea) 11/22/2013  . B12 deficiency 01/11/2013  . Hypovitaminosis D 01/11/2013  . Deviated septum 01/11/2013  . Hyperlipidemia 09/04/2011  . Abnormal cells of cervix 08/27/2011  . Multinodular goiter 08/27/2011  . Iatrogenic hypothyroidism   . Diabetes mellitus without complication ([place])   . GERD (gastroesophageal reflux disease)   . Anxiety with obsessional features     Virgia Land, SPT 05/30/2019, 5:53 PM  [facility] [address], [place], [address] Phone: [phone]   Fax:  [phone]  Name: Megan Bradley MRN: [id number] Date of Birth: 1941/01/13

## 2019-05-30 NOTE — Progress Notes (Signed)
   Subjective:    Patient ID: Megan Bradley, female    DOB: 1940-09-07, 78 y.o.   MRN: [id number]  Patient is a 78 year old female here for follow-up on her breast surgery from earlier in the year.  She had removal of ruptured implants with capsulectomies bilaterally.  New silicone implants were placed.  There is no sign of redness or swelling.  There is no sign of infection.  All incision sites are healing well.  She had firmness and capsule formation very quickly after the surgery.  She is presenting with that today as well.  It does not appear to be getting worse.  She does not really want to do anything about it.  She wanted to be evaluated.  She does have a mammogram scheduled for March.  I noticed a changing skin lesion on her posterior left shoulder.  The patient states she has an appointment with her dermatologist in January and will have it checked at that time.  The implants feel intact.      Review of Systems  Constitutional: Negative.  Negative for activity change and appetite change.  HENT: Negative.   Eyes: Negative.   Respiratory: Negative.   Cardiovascular: Negative.   Gastrointestinal: Negative.   Endocrine: Negative.   Genitourinary: Negative.   Musculoskeletal: Negative.   Psychiatric/Behavioral: Negative.        Objective:   Physical Exam Vitals and nursing note reviewed.  Constitutional:      Appearance: Normal appearance.  HENT:     Head: Normocephalic and atraumatic.  Cardiovascular:     Rate and Rhythm: Normal rate.     Pulses: Normal pulses.  Pulmonary:     Effort: Pulmonary effort is normal.  Neurological:     General: No focal deficit present.     Mental Status: She is alert and oriented to person, place, and time.  Psychiatric:        Mood and Affect: Mood normal.        Behavior: Behavior normal.         Assessment & Plan:     ICD-10-CM   1. S/P breast implant, silicone  [id number]   2. Ruptured silicone breast implant, initial encounter   T85.43XA   3. Ruptured left breast implant, initial encounter  T85.43XA     I encouraged the patient to follow through with the mammogram.  I like to see her back in a year if anything changes come back sooner.  We talked about options for releasing the capsule.  She does not want to do anything right now which is reasonable provided she is not in any pain.  If at any point she is worried about the integrity of the implant we can do a ultrasound or MRI  Pictures were obtained of the patient and placed in the chart with the patient's or guardian's permission.

## 2019-06-01 ENCOUNTER — Ambulatory Visit: Payer: Medicare Other

## 2019-06-06 ENCOUNTER — Ambulatory Visit: Payer: Medicare Other

## 2019-06-08 ENCOUNTER — Ambulatory Visit: Payer: Medicare Other

## 2019-06-08 ENCOUNTER — Other Ambulatory Visit: Payer: Self-pay

## 2019-06-08 DIAGNOSIS — G8929 Other chronic pain: Secondary | ICD-10-CM | POA: Diagnosis not present

## 2019-06-08 DIAGNOSIS — M25562 Pain in left knee: Secondary | ICD-10-CM | POA: Diagnosis not present

## 2019-06-08 DIAGNOSIS — M25561 Pain in right knee: Secondary | ICD-10-CM | POA: Diagnosis not present

## 2019-06-08 NOTE — Therapy (Signed)
[facility] [address] [place], [address] Phone: [phone]   Fax:  [phone]  Physical Therapy Treatment  Patient Details  Name: Megan Bradley MRN: [id number] Date of Birth: [birth year] Referring Provider (PT): Crecencio Mc, MD   Encounter Date: 06/08/2019  PT End of Session - 06/08/19 1644    Visit Number  9    Number of Visits  17    Date for PT Re-Evaluation  06/15/19    Authorization Type  9 / 10    PT Start Time  [id number]    PT Stop Time  [id number]    PT Time Calculation (min)  38 min    Activity Tolerance  Patient tolerated treatment well;No increased pain    Behavior During Therapy  WFL for tasks assessed/performed       Past Medical History:  Diagnosis Date  . Anxiety   . Anxiety disorder   . Cervical dysplasia    a. 2010 - high grade squamous intraepithelial lesion s/p excision.  . Chest pain    a. 2006 or 2007 Cath [facility]): reportedly nl cors;  b. 09/2011 ETT: nl.  . GERD (gastroesophageal reflux disease)   . Herpes zoster   . Hypothyroidism   . Orthostatic hypotension   . Pre-diabetes   . RBBB   . Vertigo   . Wears dentures    partial lower    Past Surgical History:  Procedure Laterality Date  . AUGMENTATION MAMMAPLASTY Bilateral   . BREAST EXCISIONAL BIOPSY Left   . CARDIAC CATHETERIZATION  2003   Dr.Callwood, R/L heart cath,  . COLONOSCOPY WITH PROPOFOL N/A 06/12/2017   Procedure: COLONOSCOPY WITH PROPOFOL;  Surgeon: Lucilla Lame, MD;  Location: [facility];  Service: Endoscopy;  Laterality: N/A;  . ESOPHAGEAL MANOMETRY N/A 06/19/2016   Procedure: ESOPHAGEAL MANOMETRY (EM);  Surgeon: Lucilla Lame, MD;  Location: [facility];  Service: Endoscopy;  Laterality: N/A;  . ESOPHAGOGASTRODUODENOSCOPY (EGD) WITH PROPOFOL N/A 04/03/2017   Procedure: ESOPHAGOGASTRODUODENOSCOPY (EGD) WITH PROPOFOL;  Surgeon: Lucilla Lame, MD;  Location: [place];  Service: Endoscopy;   Laterality: N/A;  diabetic - diet controlled  . THYROIDECTOMY  05/15/15   [facility]    There were no vitals filed for this visit.  Subjective Assessment - 06/08/19 1607    Subjective  Patient reports not doing HEP over break. No pain despite increased activity level.    Pertinent History  B knee OA and pain. Reports onset 2-3 years ago with R worsening before L. She received an injection in R (presumed cortisone) with good results. A second round of shots in R/L knees worsened her pain. Reports pain only with activity. Worst pain 5/10 R, 9/10 L. Agg: straightening L knee after prolonged flexion. Ease: activity, rest. Comorbities: orthostatic hypotension, DM. PMH of quadriceps tear in LLE, reported to be fully healed. Reports that walking is okay but incline/decline and transfers still bother her.    Limitations  Standing;Walking;House hold activities;Sitting    How long can you sit comfortably?  Indefinitely but requires UE assist for sit <> stand    How long can you stand comfortably?  Indefinitely    How long can you walk comfortably?  Cannot walk comfortably    Diagnostic tests  pt reports x-ray positive for "arthritis" in B knees    Patient Stated Goals  To be able to walk and rise from sitting without pain    Currently in Pain?  No/denies  Pain Onset  In the past 7 days       TREATMENT  TE Leg press 75# x 5 Step ups 6" x 15 R/L with mirror feedback to correct valgus collapse Eccentric quad 4" step x 20 R/L with mirror feedback to correct valgus collapse Segmental bridges x 20 with cues for foot placement and target muscle activation  TE for hip and knee strengthening for reduction of pain and improvement of dynamic control of hip and knee joints   MT Mobilization to R ankle into eversion at calcaneus and midfoot grade 3 x 30 Post tx: R ankle eversion measured at 10 deg contributing to knee valgus   NMR Tandem walk 10 ft x 8 - note reduced LOB LS holds multiple trials 10 sec R  10 sec L  NMR for control of knee valgus moment with ambulation and functional movement   PT Education - 06/08/19 1834    Education Details  form/technique with exercise    Person(s) Educated  Patient    Methods  Explanation;Demonstration;Tactile cues;Verbal cues    Comprehension  Verbalized understanding;Returned demonstration;Verbal cues required;Tactile cues required       PT Short Term Goals - 04/20/19 1624      PT SHORT TERM GOAL #1   Title  Patient will demonstrate adherence to HEP at least 3x/wk to speed recovery and reduce total number of visits.    Baseline  HEP given    Time  2    Period  Weeks    Status  New    Target Date  05/04/19        PT Long Term Goals - 04/20/19 1624      PT LONG TERM GOAL #1   Title  Patient will report worst pain of 3/10 in B knees to demonstrate reduced disability with ADLs.    Baseline  5/10 R 9/10 L    Time  8    Period  Weeks    Status  New    Target Date  06/15/19      PT LONG TERM GOAL #2   Title  Patient will demonstrate gross hip strength of 4+/5 B for normalization of gait kinematics and reduced pain.    Baseline  Abd/add 3/5    Time  8    Period  Weeks    Status  New    Target Date  06/15/19      PT LONG TERM GOAL #3   Title  Patient will demonstrate ability to hold tandem stance over R and L for 10 seconds without increased postural sway for reduced fall risk with ADLs.    Baseline  Increased sway requiring min A for safety    Time  8    Period  Weeks    Status  New    Target Date  06/15/19      PT LONG TERM GOAL #4   Title  Patient will report ability to walk 10 minutes without increased pain for commencement of walking program.    Baseline  Walking increases pain immediately    Time  8    Period  Weeks    Status  New    Target Date  06/15/19      PT LONG TERM GOAL #5   Title  Patient will demonstrate rising from a chair without UE assist or increase in pain for reduced fall risk and disability with  transfers.    Baseline  Requires UE assist    Time  8    Period  Weeks    Status  New    Target Date  06/15/19            Plan - 06/08/19 1834    Clinical Impression Statement  Examination of R ankle reveals limitations in eversion influencing narrow stance and balance deficits, improved with manual mobilization. Strength and motor control in hip are progressing well resulting in reduced valgus moment with therapeutic exercise. Deficits remain in core strength and stability and eccentric control of BLE knee resulting in abnormal loading pattern contributing to patient's pain and postural deficits. Patient will benefit from skilled physical therapy to return to PLOF.    Personal Factors and Comorbidities  Age;Time since onset of injury/illness/exacerbation;Comorbidity 1    Comorbidities  orthostatic hypotension    Examination-Activity Limitations  Sit;Squat;Stairs;Stand    Examination-Participation Restrictions  Shop;Community Activity    Stability/Clinical Decision Making  Stable/Uncomplicated    Rehab Potential  Good    PT Frequency  2x / week    PT Duration  8 weeks    PT Treatment/Interventions  Cryotherapy;Electrical Stimulation;Moist Heat;Aquatic Statistician;Therapeutic activities;Therapeutic exercise;Balance training;Neuromuscular re-education;Patient/family education;Manual techniques;Passive range of motion;Dry needling;Energy conservation;Taping;Joint Manipulations    PT Next Visit Plan  Reassess    PT Home Exercise Plan  SLS holds, tandem walk, eccentric quad with mirror feedback    Consulted and Agree with Plan of Care  Patient       Patient will benefit from skilled therapeutic intervention in order to improve the following deficits and impairments:  Abnormal gait, Difficulty walking, Decreased endurance, Decreased activity tolerance, Pain, Decreased balance, Improper body mechanics, Decreased mobility, Decreased strength, Postural  dysfunction  Visit Diagnosis: Chronic pain of left knee  Chronic pain of right knee     Problem List Patient Active Problem List   Diagnosis Date Noted  . Ruptured left breast implant, initial encounter 07/05/2018  . Ruptured silicone breast implant 07/05/2018  . Change in bowel habits 07/04/2018  . Knee pain, right 04/11/2018  . S/P breast implant, silicone [id number]  . Osteoporosis of femur without pathological fracture 03/11/2018  . 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 Tubular adenoma of colon   . Hot flushes, perimenopausal 01/05/2016  . RBBB   . Chest pain   . Orthostatic hypotension   . Shoulder pain, left 04/10/2015  . OSA (obstructive sleep apnea) 11/22/2013  . B12 deficiency 01/11/2013  . Hypovitaminosis D 01/11/2013  . Deviated septum 01/11/2013  . Hyperlipidemia 09/04/2011  . Abnormal cells of cervix 08/27/2011  . Multinodular goiter 08/27/2011  . Iatrogenic hypothyroidism   . Diabetes mellitus without complication ([place])   . GERD (gastroesophageal reflux disease)   . Anxiety with obsessional features     Virgia Land, SPT 06/08/2019, 7:09 PM  [facility] [address], [place], [address] Phone: [phone]   Fax:  [phone]  Name: Megan Bradley MRN: [id number] Date of Birth: 1941-03-14

## 2019-06-11 ENCOUNTER — Other Ambulatory Visit: Payer: Self-pay | Admitting: Internal Medicine

## 2019-06-14 ENCOUNTER — Other Ambulatory Visit: Payer: Self-pay

## 2019-06-14 ENCOUNTER — Telehealth: Payer: Self-pay | Admitting: Internal Medicine

## 2019-06-14 ENCOUNTER — Ambulatory Visit: Payer: Medicare Other | Attending: Internal Medicine

## 2019-06-14 DIAGNOSIS — E119 Type 2 diabetes mellitus without complications: Secondary | ICD-10-CM

## 2019-06-14 DIAGNOSIS — M25562 Pain in left knee: Secondary | ICD-10-CM | POA: Diagnosis not present

## 2019-06-14 DIAGNOSIS — M25561 Pain in right knee: Secondary | ICD-10-CM | POA: Diagnosis not present

## 2019-06-14 DIAGNOSIS — E782 Mixed hyperlipidemia: Secondary | ICD-10-CM

## 2019-06-14 DIAGNOSIS — G8929 Other chronic pain: Secondary | ICD-10-CM

## 2019-06-14 DIAGNOSIS — E032 Hypothyroidism due to medicaments and other exogenous substances: Secondary | ICD-10-CM

## 2019-06-14 NOTE — Telephone Encounter (Signed)
Pt has a virtual on 1/29 and would like to have labs done, A1c and thyroid. If labs are needed please let me know when you put the orders in so I can schedule. Thanks

## 2019-06-14 NOTE — Therapy (Signed)
[facility] [address] [place], [address] Phone: [phone]   Fax:  [phone]  Physical Therapy Treatment/ Progress Note  Patient Details  Name: Megan Bradley MRN: [id number] Date of Birth: 1941-02-27 Referring Provider (PT): Crecencio Mc, MD  Reporting Period 04/20/2019- 06/14/2019  Encounter Date: 06/14/2019  PT End of Session - 06/14/19 1455    Visit Number  10    Number of Visits  17    Date for PT Re-Evaluation  06/15/19    Authorization Type  10 / 10 Recert Today    PT Start Time  1100    PT Stop Time  1145    PT Time Calculation (min)  45 min    Activity Tolerance  Patient tolerated treatment well;No increased pain    Behavior During Therapy  WFL for tasks assessed/performed       Past Medical History:  Diagnosis Date  . Anxiety   . Anxiety disorder   . Cervical dysplasia    a. 2010 - high grade squamous intraepithelial lesion s/p excision.  . Chest pain    a. 2006 or 2007 Cath [facility]): reportedly nl cors;  b. 09/2011 ETT: nl.  . GERD (gastroesophageal reflux disease)   . Herpes zoster   . Hypothyroidism   . Orthostatic hypotension   . Pre-diabetes   . RBBB   . Vertigo   . Wears dentures    partial lower    Past Surgical History:  Procedure Laterality Date  . AUGMENTATION MAMMAPLASTY Bilateral   . BREAST EXCISIONAL BIOPSY Left   . CARDIAC CATHETERIZATION  2003   Dr.Callwood, R/L heart cath,  . COLONOSCOPY WITH PROPOFOL N/A 06/12/2017   Procedure: COLONOSCOPY WITH PROPOFOL;  Surgeon: Lucilla Lame, MD;  Location: [place];  Service: Endoscopy;  Laterality: N/A;  . ESOPHAGEAL MANOMETRY N/A 06/19/2016   Procedure: ESOPHAGEAL MANOMETRY (EM);  Surgeon: Lucilla Lame, MD;  Location: [facility];  Service: Endoscopy;  Laterality: N/A;  . ESOPHAGOGASTRODUODENOSCOPY (EGD) WITH PROPOFOL N/A 04/03/2017   Procedure: ESOPHAGOGASTRODUODENOSCOPY (EGD) WITH PROPOFOL;  Surgeon:  Lucilla Lame, MD;  Location: [place];  Service: Endoscopy;  Laterality: N/A;  diabetic - diet controlled  . THYROIDECTOMY  05/15/15   [facility]    There were no vitals filed for this visit.  Subjective Assessment - 06/14/19 1107    Subjective  Patient states she continues to have good days and bad days but states she would like a print out of the information for exercises. She states she continues to have bending difficulties.    Pertinent History  B knee OA and pain. Reports onset 2-3 years ago with R worsening before L. She received an injection in R (presumed cortisone) with good results. A second round of shots in R/L knees worsened her pain. Reports pain only with activity. Worst pain 5/10 R, 9/10 L. Agg: straightening L knee after prolonged flexion. Ease: activity, rest. Comorbities: orthostatic hypotension, DM. PMH of quadriceps tear in LLE, reported to be fully healed. Reports that walking is okay but incline/decline and transfers still bother her.    Limitations  Standing;Walking;House hold activities;Sitting    How long can you sit comfortably?  Indefinitely but requires UE assist for sit <> stand    How long can you stand comfortably?  Indefinitely    How long can you walk comfortably?  Cannot walk comfortably    Diagnostic tests  pt reports x-ray positive for "arthritis" in B knees  Patient Stated Goals  To be able to walk and rise from sitting without pain    Currently in Pain?  No/denies    Pain Onset  In the past 7 days    Multiple Pain Sites  No              TREATMENT - recert   TE Leg press [id number] x 15; 65# x 15 Step ups 6" x 15 R/L with mirror feedback to correct valgus collapse Heel taps from 6" step Eccentric quad 4" step x 20 R/L with mirror feedback to correct valgus collapse Hip abduction with YTB -- x 20 B Hip circles performed behind with YTB -- x 20 B Hip extension with YTB -- x 20 B  Performed exercises to improve pain and spasms with squatting  and to improve hip control     PT Education - 06/14/19 1455    Education Details  form/techniqu with exercise    Person(s) Educated  Patient    Methods  Explanation;Demonstration    Comprehension  Verbalized understanding;Returned demonstration       PT Short Term Goals - 04/20/19 1624      PT SHORT TERM GOAL #1   Title  Patient will demonstrate adherence to HEP at least 3x/wk to speed recovery and reduce total number of visits.    Baseline  HEP given    Time  2    Period  Weeks    Status  New    Target Date  05/04/19        PT Long Term Goals - 04/20/19 1624      PT LONG TERM GOAL #1   Title  Patient will report worst pain of 3/10 in B knees to demonstrate reduced disability with ADLs.    Baseline  5/10 R 9/10 L    Time  8    Period  Weeks    Status  New    Target Date  06/15/19      PT LONG TERM GOAL #2   Title  Patient will demonstrate gross hip strength of 4+/5 B for normalization of gait kinematics and reduced pain.    Baseline  Abd/add 3/5    Time  8    Period  Weeks    Status  New    Target Date  06/15/19      PT LONG TERM GOAL #3   Title  Patient will demonstrate ability to hold tandem stance over R and L for 10 seconds without increased postural sway for reduced fall risk with ADLs.    Baseline  Increased sway requiring min A for safety    Time  8    Period  Weeks    Status  New    Target Date  06/15/19      PT LONG TERM GOAL #4   Title  Patient will report ability to walk 10 minutes without increased pain for commencement of walking program.    Baseline  Walking increases pain immediately    Time  8    Period  Weeks    Status  New    Target Date  06/15/19      PT LONG TERM GOAL #5   Title  Patient will demonstrate rising from a chair without UE assist or increase in pain for reduced fall risk and disability with transfers.    Baseline  Requires UE assist    Time  8    Period  Weeks    Status  New    Target Date  06/15/19             Plan - 06/14/19 1456    Clinical Impression Statement  Patient is making progress towards long term goals with an overall improvement in pain, walking ability, sit to stand ability and squatting. Although patinet is improving, she conitnues ot have limitations with squatting and bending through greater AROM. Patient demonstrates limitations along her strengthening musculature of her hips. Patient will benefit from further skilled therapy to return to prior level of function.    Personal Factors and Comorbidities  Age;Time since onset of injury/illness/exacerbation;Comorbidity 1    Comorbidities  orthostatic hypotension    Examination-Activity Limitations  Sit;Squat;Stairs;Stand    Examination-Participation Restrictions  Shop;Community Activity    Stability/Clinical Decision Making  Stable/Uncomplicated    Rehab Potential  Good    PT Frequency  2x / week    PT Duration  8 weeks    PT Treatment/Interventions  Cryotherapy;Electrical Stimulation;Moist Heat;Aquatic Statistician;Therapeutic activities;Therapeutic exercise;Balance training;Neuromuscular re-education;Patient/family education;Manual techniques;Passive range of motion;Dry needling;Energy conservation;Taping;Joint Manipulations    PT Next Visit Plan  Reassess    PT Home Exercise Plan  SLS holds, tandem walk, eccentric quad with mirror feedback    Consulted and Agree with Plan of Care  Patient       Patient will benefit from skilled therapeutic intervention in order to improve the following deficits and impairments:  Abnormal gait, Difficulty walking, Decreased endurance, Decreased activity tolerance, Pain, Decreased balance, Improper body mechanics, Decreased mobility, Decreased strength, Postural dysfunction  Visit Diagnosis: Chronic pain of left knee  Chronic pain of right knee     Problem List Patient Active Problem List   Diagnosis Date Noted  . Ruptured left breast implant, initial  encounter 07/05/2018  . Ruptured silicone breast implant 07/05/2018  . Change in bowel habits 07/04/2018  . Knee pain, right 04/11/2018  . S/P breast implant, silicone [id number]  . Osteoporosis of femur without pathological fracture 03/11/2018  . 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 Tubular adenoma of colon   . Hot flushes, perimenopausal 01/05/2016  . RBBB   . Chest pain   . Orthostatic hypotension   . Shoulder pain, left 04/10/2015  . OSA (obstructive sleep apnea) 11/22/2013  . B12 deficiency 01/11/2013  . Hypovitaminosis D 01/11/2013  . Deviated septum 01/11/2013  . Hyperlipidemia 09/04/2011  . Abnormal cells of cervix 08/27/2011  . Multinodular goiter 08/27/2011  . Iatrogenic hypothyroidism   . Diabetes mellitus without complication ([place])   . GERD (gastroesophageal reflux disease)   . Anxiety with obsessional features     Blythe Stanford, PT DPT 06/14/2019, 2:59 PM  [facility] [address], [place], [address] Phone: [phone]   Fax:  [phone]  Name: ZARI STEFFENSMEIER MRN: [id number] Date of Birth: 04-23-1941

## 2019-06-15 NOTE — Telephone Encounter (Signed)
Pt just had labs done in 03/2019 so I just ordered A1c, CMP, and lipid panel. Is there anything else that needs to be ordered?

## 2019-06-15 NOTE — Telephone Encounter (Signed)
TSH ADDED.  THANKS!

## 2019-06-20 ENCOUNTER — Ambulatory Visit: Payer: Medicare Other

## 2019-06-20 ENCOUNTER — Other Ambulatory Visit: Payer: Self-pay

## 2019-06-20 DIAGNOSIS — G8929 Other chronic pain: Secondary | ICD-10-CM

## 2019-06-20 DIAGNOSIS — M25561 Pain in right knee: Secondary | ICD-10-CM

## 2019-06-20 DIAGNOSIS — M25562 Pain in left knee: Secondary | ICD-10-CM

## 2019-06-20 NOTE — Therapy (Signed)
[facility] [address] [place], [address] Phone: [phone]   Fax:  [phone]  Physical Therapy Treatment  Patient Details  Name: Megan Bradley MRN: [id number] Date of Birth: [birth year] Referring Provider (PT): Crecencio Mc, MD   Encounter Date: 06/20/2019  PT End of Session - 06/20/19 0907    Visit Number  11    Number of Visits  17    Date for PT Re-Evaluation  06/15/19    Authorization Type  1 / 10    PT Start Time  0900    PT Stop Time  0945    PT Time Calculation (min)  45 min    Activity Tolerance  Patient tolerated treatment well;No increased pain;Treatment limited secondary to medical complications (Comment)   Upper respiratory congestion   Behavior During Therapy  [facility] for tasks assessed/performed       Past Medical History:  Diagnosis Date  . Anxiety   . Anxiety disorder   . Cervical dysplasia    a. 2010 - high grade squamous intraepithelial lesion s/p excision.  . Chest pain    a. 2006 or 2007 Cath [facility]): reportedly nl cors;  b. 09/2011 ETT: nl.  . GERD (gastroesophageal reflux disease)   . Herpes zoster   . Hypothyroidism   . Orthostatic hypotension   . Pre-diabetes   . RBBB   . Vertigo   . Wears dentures    partial lower    Past Surgical History:  Procedure Laterality Date  . AUGMENTATION MAMMAPLASTY Bilateral   . BREAST EXCISIONAL BIOPSY Left   . CARDIAC CATHETERIZATION  2003   Dr.Callwood, R/L heart cath,  . COLONOSCOPY WITH PROPOFOL N/A 06/12/2017   Procedure: COLONOSCOPY WITH PROPOFOL;  Surgeon: Lucilla Lame, MD;  Location: [place];  Service: Endoscopy;  Laterality: N/A;  . ESOPHAGEAL MANOMETRY N/A 06/19/2016   Procedure: ESOPHAGEAL MANOMETRY (EM);  Surgeon: Lucilla Lame, MD;  Location: [facility];  Service: Endoscopy;  Laterality: N/A;  . ESOPHAGOGASTRODUODENOSCOPY (EGD) WITH PROPOFOL N/A 04/03/2017   Procedure: ESOPHAGOGASTRODUODENOSCOPY (EGD) WITH  PROPOFOL;  Surgeon: Lucilla Lame, MD;  Location: [place];  Service: Endoscopy;  Laterality: N/A;  diabetic - diet controlled  . THYROIDECTOMY  05/15/15   [facility]    There were no vitals filed for this visit.  Subjective Assessment - 06/20/19 0903    Subjective  Patient reports to PT with nasal congestion reporting cough in morning but without temperature. Has initiated walking program 15-20 min with hills without pain.    Pertinent History  B knee OA and pain. Reports onset 2-3 years ago with R worsening before L. She received an injection in R (presumed cortisone) with good results. A second round of shots in R/L knees worsened her pain. Reports pain only with activity. Worst pain 5/10 R, 9/10 L. Agg: straightening L knee after prolonged flexion. Ease: activity, rest. Comorbities: orthostatic hypotension, DM. PMH of quadriceps tear in LLE, reported to be fully healed. Reports that walking is okay but incline/decline and transfers still bother her.    Limitations  Standing;Walking;House hold activities;Sitting    How long can you sit comfortably?  Indefinitely but requires UE assist for sit <> stand    How long can you stand comfortably?  Indefinitely    How long can you walk comfortably?  Cannot walk comfortably    Diagnostic tests  pt reports x-ray positive for "arthritis" in B knees    Patient Stated Goals  To be able to walk and rise from sitting without pain    Currently in Pain?  No/denies    Pain Onset  In the past 7 days       TREATMENT  Vitals: Temp (oral) 98.4  TE Segmental bridges x 20 with UE stabilization, x 20 without STS x 10 with red theraband on knees to cue lateral hip activation - note weight shifting towards heels STS x 5 with cues to place weight evenly through feet Lateral steps red TB 10 ft x 8 - cues for slow eccentric Monster walks red TB 10 ft x 6 - cues for correct technique for improved target muscle activation Step ups x 20 R/Lwith mirror feedback  and faded UE support from single hand to two fingers Running man x 10 R/L with mirror feedback and faded UE support from single hand to two fingers. Note anterior pelvic tilt/pelvic elevation compensation in end-range Hip abduction against red theraband resistance with extensive cueing for neutral pelvic alignment 2 x 10 R/L Glute med hip hikes x 10 R/L - pt unable to stabilize over LLE demonstrating consistent quad compensation despite cueing; improved on RLE but additional instruction required  TE for gluteal and quad strengthening and stabilization   PT Education - 06/20/19 0906    Education Details  form/technique with exercise    Person(s) Educated  Patient    Methods  Demonstration;Explanation;Verbal cues;Tactile cues    Comprehension  Verbal cues required;Tactile cues required;Returned demonstration;Verbalized understanding       PT Short Term Goals - 04/20/19 1624      PT SHORT TERM GOAL #1   Title  Patient will demonstrate adherence to HEP at least 3x/wk to speed recovery and reduce total number of visits.    Baseline  HEP given    Time  2    Period  Weeks    Status  New    Target Date  05/04/19        PT Long Term Goals - 06/14/19 1500      PT LONG TERM GOAL #1   Title  Patient will report worst pain of 3/10 in B knees to demonstrate reduced disability with ADLs.    Baseline  5/10 R 9/10 L; 6/ 10 B    Time  8    Period  Weeks    Status  On-going      PT LONG TERM GOAL #2   Title  Patient will demonstrate gross hip strength of 4+/5 B for normalization of gait kinematics and reduced pain.    Baseline  Abd/add 3/5; 06/13/2018: 4/5    Time  8    Period  Weeks    Status  On-going      PT LONG TERM GOAL #3   Title  Patient will demonstrate ability to hold tandem stance over R and L for 10 seconds without increased postural sway for reduced fall risk with ADLs.    Baseline  Increased sway requiring min A for safety; 06/14/2019: 4 sec B    Time  8    Period  Weeks     Status  On-going      PT LONG TERM GOAL #4   Title  Patient will report ability to walk 10 minutes without increased pain for commencement of walking program.    Baseline  Walking increases pain immediately; 06/13/2018: no pain with walking    Time  8    Period  Weeks    Status  Achieved  PT LONG TERM GOAL #5   Title  Patient will demonstrate rising from a chair without UE assist or increase in pain for reduced fall risk and disability with transfers.    Baseline  Requires UE assist; 06/13/2018: able to perform    Time  8    Period  Weeks    Status  Achieved            Plan - 06/20/19 0959    Clinical Impression Statement  Patient demonstrates improved tolerance for step up exercises and improved proprioception correcting valgus collapse but mirror feedback still required. Note consistent anterior pelvic tilt/pelvic elevation compensation limiting loading through lateral hip musculature with therex; plan to initiate pelvic depression/posterior tilt therex next session. Patient will benefit from further skilled therapy to return to PLOF.    Personal Factors and Comorbidities  Age;Time since onset of injury/illness/exacerbation;Comorbidity 1    Comorbidities  orthostatic hypotension    Examination-Activity Limitations  Sit;Squat;Stairs;Stand    Examination-Participation Restrictions  Shop;Community Activity    Stability/Clinical Decision Making  Stable/Uncomplicated    Rehab Potential  Good    PT Frequency  2x / week    PT Duration  8 weeks    PT Treatment/Interventions  Cryotherapy;Electrical Stimulation;Moist Heat;Aquatic Statistician;Therapeutic activities;Therapeutic exercise;Balance training;Neuromuscular re-education;Patient/family education;Manual techniques;Passive range of motion;Dry needling;Energy conservation;Taping;Joint Manipulations    PT Next Visit Plan  Pelvic depression/posterior tilt    PT Home Exercise Plan  Eccentric quad with mirror  feedback; SLS    Consulted and Agree with Plan of Care  Patient       Patient will benefit from skilled therapeutic intervention in order to improve the following deficits and impairments:  Abnormal gait, Difficulty walking, Decreased endurance, Decreased activity tolerance, Pain, Decreased balance, Improper body mechanics, Decreased mobility, Decreased strength, Postural dysfunction  Visit Diagnosis: Chronic pain of left knee  Chronic pain of right knee     Problem List Patient Active Problem List   Diagnosis Date Noted  . Ruptured left breast implant, initial encounter 07/05/2018  . Ruptured silicone breast implant 07/05/2018  . Change in bowel habits 07/04/2018  . Knee pain, right 04/11/2018  . S/P breast implant, silicone [id number]  . Osteoporosis of femur without pathological fracture 03/11/2018  . 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 Tubular adenoma of colon   . Hot flushes, perimenopausal 01/05/2016  . RBBB   . Chest pain   . Orthostatic hypotension   . Shoulder pain, left 04/10/2015  . OSA (obstructive sleep apnea) 11/22/2013  . B12 deficiency 01/11/2013  . Hypovitaminosis D 01/11/2013  . Deviated septum 01/11/2013  . Hyperlipidemia 09/04/2011  . Abnormal cells of cervix 08/27/2011  . Multinodular goiter 08/27/2011  . Iatrogenic hypothyroidism   . Diabetes mellitus without complication ([place])   . GERD (gastroesophageal reflux disease)   . Anxiety with obsessional features     Virgia Land, SPT 06/20/2019, 10:07 AM  [facility] [address] [place], [address] Phone: [phone]   Fax:  [phone]  Name: MERCEDEZ ROUNDTREE MRN: [id number] Date of Birth: 09/22/1940

## 2019-06-21 ENCOUNTER — Ambulatory Visit: Payer: Medicare Other

## 2019-06-21 ENCOUNTER — Ambulatory Visit: Payer: Medicare Other | Admitting: Internal Medicine

## 2019-06-22 ENCOUNTER — Other Ambulatory Visit: Payer: Self-pay | Admitting: Internal Medicine

## 2019-06-22 NOTE — Telephone Encounter (Signed)
Refilled: 03/21/2019 Last OV: 04/06/2019 Next OV: 07/08/2019

## 2019-06-28 ENCOUNTER — Other Ambulatory Visit: Payer: Self-pay

## 2019-06-28 ENCOUNTER — Ambulatory Visit: Payer: Medicare Other

## 2019-06-28 DIAGNOSIS — G8929 Other chronic pain: Secondary | ICD-10-CM

## 2019-06-28 DIAGNOSIS — M25561 Pain in right knee: Secondary | ICD-10-CM | POA: Diagnosis not present

## 2019-06-28 DIAGNOSIS — M25562 Pain in left knee: Secondary | ICD-10-CM | POA: Diagnosis not present

## 2019-06-28 NOTE — Therapy (Signed)
[facility] [address], [place], [address] Phone: [phone]   Fax:  [phone]  Physical Therapy Treatment  Patient Details  Name: Megan Bradley MRN: [id number] Date of Birth: March 11, 1941 Referring Provider (PT): Crecencio Mc, MD   Encounter Date: 06/28/2019  PT End of Session - 06/28/19 1030    Visit Number  12    Number of Visits  17    Date for PT Re-Evaluation  07/25/19    Authorization Type  2/10    PT Start Time  1030    PT Stop Time  1115    PT Time Calculation (min)  45 min    Activity Tolerance  Patient tolerated treatment well;No increased pain;Treatment limited secondary to medical complications (Comment)   Upper respiratory congestion   Behavior During Therapy  [facility] for tasks assessed/performed       Past Medical History:  Diagnosis Date  . Anxiety   . Anxiety disorder   . Cervical dysplasia    a. 2010 - high grade squamous intraepithelial lesion s/p excision.  . Chest pain    a. 2006 or 2007 Cath [facility]): reportedly nl cors;  b. 09/2011 ETT: nl.  . GERD (gastroesophageal reflux disease)   . Herpes zoster   . Hypothyroidism   . Orthostatic hypotension   . Pre-diabetes   . RBBB   . Vertigo   . Wears dentures    partial lower    Past Surgical History:  Procedure Laterality Date  . AUGMENTATION MAMMAPLASTY Bilateral   . BREAST EXCISIONAL BIOPSY Left   . CARDIAC CATHETERIZATION  2003   Dr.Callwood, R/L heart cath,  . COLONOSCOPY WITH PROPOFOL N/A 06/12/2017   Procedure: COLONOSCOPY WITH PROPOFOL;  Surgeon: Lucilla Lame, MD;  Location: [place];  Service: Endoscopy;  Laterality: N/A;  . ESOPHAGEAL MANOMETRY N/A 06/19/2016   Procedure: ESOPHAGEAL MANOMETRY (EM);  Surgeon: Lucilla Lame, MD;  Location: [facility];  Service: Endoscopy;  Laterality: N/A;  . ESOPHAGOGASTRODUODENOSCOPY (EGD) WITH PROPOFOL N/A 04/03/2017   Procedure: ESOPHAGOGASTRODUODENOSCOPY (EGD) WITH  PROPOFOL;  Surgeon: Lucilla Lame, MD;  Location: [place];  Service: Endoscopy;  Laterality: N/A;  diabetic - diet controlled  . THYROIDECTOMY  05/15/15   [facility]    There were no vitals filed for this visit.   Subjective Assessment - 06/28/19 1030    Subjective  Pt reports to PT not feeling any pain today. She has been busy recently so she has not been able to complete her HEP consistently    Pertinent History  B knee OA and pain. Reports onset 2-3 years ago with R worsening before L. She received an injection in R (presumed cortisone) with good results. A second round of shots in R/L knees worsened her pain. Reports pain only with activity. Worst pain 5/10 R, 9/10 L. Agg: straightening L knee after prolonged flexion. Ease: activity, rest. Comorbities: orthostatic hypotension, DM. PMH of quadriceps tear in LLE, reported to be fully healed. Reports that walking is okay but incline/decline and transfers still bother her.    Limitations  Standing;Walking;House hold activities;Sitting    How long can you sit comfortably?  Indefinitely but requires UE assist for sit <> stand    How long can you stand comfortably?  Indefinitely    How long can you walk comfortably?  Cannot walk comfortably    Diagnostic tests  pt reports x-ray positive for "arthritis" in B knees    Patient Stated Goals  To be able to walk and rise from sitting without pain    Currently in Pain?  No/denies    Pain Onset  In the past 7 days      Treatment  TA  Sideways lunge 3 x 10 ea (gluteal Strengthening) cue with sitting back into chair  Leg Press 2 x 10 at #60 STS 3 x 10 #20 w/ green band for lateral hip activation  Lateral Step ups 2 x 12 cue for lateral hip activation    Therapeutic activities conducted to enhance muscular strength of quads and gluteal muscles    PT Education - 06/28/19 1134    Education Details  form/technique with exercise, particularly with keeping knees out with exercises    Person(s)  Educated  Patient    Methods  Explanation;Demonstration;Tactile cues;Verbal cues    Comprehension  Verbalized understanding;Returned demonstration;Verbal cues required;Tactile cues required       PT Short Term Goals - 04/20/19 1624      PT SHORT TERM GOAL #1   Title  Patient will demonstrate adherence to HEP at least 3x/wk to speed recovery and reduce total number of visits.    Baseline  HEP given    Time  2    Period  Weeks    Status  New    Target Date  05/04/19        PT Long Term Goals - 06/14/19 1500      PT LONG TERM GOAL #1   Title  Patient will report worst pain of 3/10 in B knees to demonstrate reduced disability with ADLs.    Baseline  5/10 R 9/10 L; 6/ 10 B    Time  8    Period  Weeks    Status  On-going      PT LONG TERM GOAL #2   Title  Patient will demonstrate gross hip strength of 4+/5 B for normalization of gait kinematics and reduced pain.    Baseline  Abd/add 3/5; 06/13/2018: 4/5    Time  8    Period  Weeks    Status  On-going      PT LONG TERM GOAL #3   Title  Patient will demonstrate ability to hold tandem stance over R and L for 10 seconds without increased postural sway for reduced fall risk with ADLs.    Baseline  Increased sway requiring min A for safety; 06/14/2019: 4 sec B    Time  8    Period  Weeks    Status  On-going      PT LONG TERM GOAL #4   Title  Patient will report ability to walk 10 minutes without increased pain for commencement of walking program.    Baseline  Walking increases pain immediately; 06/13/2018: no pain with walking    Time  8    Period  Weeks    Status  Achieved      PT LONG TERM GOAL #5   Title  Patient will demonstrate rising from a chair without UE assist or increase in pain for reduced fall risk and disability with transfers.    Baseline  Requires UE assist; 06/13/2018: able to perform    Time  8    Period  Weeks    Status  Achieved       Patient will benefit from skilled therapeutic intervention in order to  improve the following deficits and impairments:  Abnormal gait, Difficulty walking, Decreased endurance, Decreased activity tolerance, Pain, Decreased balance, Improper body  mechanics, Decreased mobility, Decreased strength, Postural dysfunction  Visit Diagnosis: Chronic pain of left knee  Chronic pain of right knee     Problem List Patient Active Problem List   Diagnosis Date Noted  . Ruptured left breast implant, initial encounter 07/05/2018  . Ruptured silicone breast implant 07/05/2018  . Change in bowel habits 07/04/2018  . Knee pain, right 04/11/2018  . S/P breast implant, silicone [id number]  . Osteoporosis of femur without pathological fracture 03/11/2018  . 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 Tubular adenoma of colon   . Hot flushes, perimenopausal 01/05/2016  . RBBB   . Chest pain   . Orthostatic hypotension   . Shoulder pain, left 04/10/2015  . OSA (obstructive sleep apnea) 11/22/2013  . B12 deficiency 01/11/2013  . Hypovitaminosis D 01/11/2013  . Deviated septum 01/11/2013  . Hyperlipidemia 09/04/2011  . Abnormal cells of cervix 08/27/2011  . Multinodular goiter 08/27/2011  . Iatrogenic hypothyroidism   . Diabetes mellitus without complication ([place])   . GERD (gastroesophageal reflux disease)   . Anxiety with obsessional features     Gloriann Loan, SPT  06/29/2019, 2:17 PM  [facility] [address], [place], [address] Phone: [phone]   Fax:  [phone]  Name: Megan Bradley MRN: [id number] Date of Birth: [birth year]

## 2019-07-04 DIAGNOSIS — D0462 Carcinoma in situ of skin of left upper limb, including shoulder: Secondary | ICD-10-CM | POA: Diagnosis not present

## 2019-07-04 DIAGNOSIS — D2262 Melanocytic nevi of left upper limb, including shoulder: Secondary | ICD-10-CM | POA: Diagnosis not present

## 2019-07-04 DIAGNOSIS — Z85828 Personal history of other malignant neoplasm of skin: Secondary | ICD-10-CM | POA: Diagnosis not present

## 2019-07-04 DIAGNOSIS — D2271 Melanocytic nevi of right lower limb, including hip: Secondary | ICD-10-CM | POA: Diagnosis not present

## 2019-07-04 DIAGNOSIS — D485 Neoplasm of uncertain behavior of skin: Secondary | ICD-10-CM | POA: Diagnosis not present

## 2019-07-04 DIAGNOSIS — L570 Actinic keratosis: Secondary | ICD-10-CM | POA: Diagnosis not present

## 2019-07-04 DIAGNOSIS — L281 Prurigo nodularis: Secondary | ICD-10-CM | POA: Diagnosis not present

## 2019-07-04 DIAGNOSIS — D2261 Melanocytic nevi of right upper limb, including shoulder: Secondary | ICD-10-CM | POA: Diagnosis not present

## 2019-07-07 ENCOUNTER — Ambulatory Visit: Payer: Medicare Other

## 2019-07-08 ENCOUNTER — Ambulatory Visit: Payer: Medicare Other | Admitting: Internal Medicine

## 2019-07-08 DIAGNOSIS — Z9841 Cataract extraction status, right eye: Secondary | ICD-10-CM | POA: Diagnosis not present

## 2019-07-08 DIAGNOSIS — H5203 Hypermetropia, bilateral: Secondary | ICD-10-CM | POA: Diagnosis not present

## 2019-07-08 DIAGNOSIS — Z9842 Cataract extraction status, left eye: Secondary | ICD-10-CM | POA: Diagnosis not present

## 2019-07-08 DIAGNOSIS — H52223 Regular astigmatism, bilateral: Secondary | ICD-10-CM | POA: Diagnosis not present

## 2019-07-08 DIAGNOSIS — E119 Type 2 diabetes mellitus without complications: Secondary | ICD-10-CM | POA: Diagnosis not present

## 2019-07-08 LAB — HM DIABETES EYE EXAM

## 2019-07-12 ENCOUNTER — Ambulatory Visit: Payer: Medicare Other

## 2019-07-12 ENCOUNTER — Other Ambulatory Visit: Payer: Self-pay | Admitting: Internal Medicine

## 2019-07-15 ENCOUNTER — Encounter: Payer: Self-pay | Admitting: Internal Medicine

## 2019-07-15 ENCOUNTER — Ambulatory Visit (INDEPENDENT_AMBULATORY_CARE_PROVIDER_SITE_OTHER): Payer: Medicare Other | Admitting: Internal Medicine

## 2019-07-15 ENCOUNTER — Other Ambulatory Visit: Payer: Self-pay

## 2019-07-15 VITALS — BP 120/75 | Ht 66.0 in | Wt 148.0 lb

## 2019-07-15 DIAGNOSIS — E119 Type 2 diabetes mellitus without complications: Secondary | ICD-10-CM

## 2019-07-15 DIAGNOSIS — R87619 Unspecified abnormal cytological findings in specimens from cervix uteri: Secondary | ICD-10-CM

## 2019-07-15 DIAGNOSIS — E032 Hypothyroidism due to medicaments and other exogenous substances: Secondary | ICD-10-CM

## 2019-07-15 DIAGNOSIS — E538 Deficiency of other specified B group vitamins: Secondary | ICD-10-CM | POA: Diagnosis not present

## 2019-07-15 DIAGNOSIS — G4733 Obstructive sleep apnea (adult) (pediatric): Secondary | ICD-10-CM

## 2019-07-15 DIAGNOSIS — E559 Vitamin D deficiency, unspecified: Secondary | ICD-10-CM | POA: Diagnosis not present

## 2019-07-15 DIAGNOSIS — N879 Dysplasia of cervix uteri, unspecified: Secondary | ICD-10-CM

## 2019-07-15 DIAGNOSIS — E7849 Other hyperlipidemia: Secondary | ICD-10-CM | POA: Diagnosis not present

## 2019-07-15 DIAGNOSIS — M810 Age-related osteoporosis without current pathological fracture: Secondary | ICD-10-CM

## 2019-07-15 NOTE — Patient Instructions (Signed)
FASTING LABS IN [facility] [place]  CPE WITH PAP SMEAR IN EARLY [place] FOR THE COUGH:  GET A WEDGE TO PLACE ON YOUR BED TO ELEVATE YOUR TORSO

## 2019-07-15 NOTE — Progress Notes (Signed)
Virtual Visit CONVERTED TO TELEPHONE  Note  This visit type was conducted due to national recommendations for restrictions regarding the COVID-19 pandemic (e.g. social distancing).  This format is felt to be most appropriate for this patient at this time.  All issues noted in this document were discussed and addressed.  No physical exam was performed (except for noted visual exam findings with Video Visits).   I connected with@ on 07/15/19 at  3:30 PM EST by telephone and verified that I am speaking with the correct person using two identifiers. Interactive audio and video telecommunications were attempted between this provider and patient, however failed, due to patient having technical difficulties OR patient did not have access to video capability.  We continued and completed visit with audio only.   Location patient: home Location provider: work or home office Persons participating in the virtual visit: patient, provider  I discussed the limitations, risks, security and privacy concerns of performing an evaluation and management service by telephone and the availability of in person appointments. I also discussed with the patient that there may be a patient responsible charge related to this service. The patient expressed understanding and agreed to proceed.  Reason for visit: FOLLOW UP on multiple issues  HPI:   FEELING BETTER ON INCREASED THYROID DOSE.  Taking one extra dose weekly.  Hair growing,  More energy nails growing too .   B12 DEFICIENCY:    WEIGHT GAIN  Not following low GI diet. Not exercising    ROS: .Patient denies headache, fevers, malaise, unintentional weight loss, skin rash, eye pain, sinus congestion and sinus pain, sore throat, dysphagia,  hemoptysis , cough, dyspnea, wheezing, chest pain, palpitations, orthopnea, edema, abdominal pain, nausea, melena, diarrhea, constipation, flank pain, dysuria, hematuria, urinary  Frequency, nocturia, numbness, tingling, seizures,   Focal weakness, Loss of consciousness,  Tremor, insomnia, depression, anxiety, and suicidal ideation.      Past Medical History:  Diagnosis Date  . Anxiety   . Anxiety disorder   . Cervical dysplasia    a. 2010 - high grade squamous intraepithelial lesion s/p excision.  . Chest pain    a. 2006 or 2007 Cath [facility]): reportedly nl cors;  b. 09/2011 ETT: nl.  . GERD (gastroesophageal reflux disease)   . Herpes zoster   . Hypothyroidism   . Orthostatic hypotension   . Pre-diabetes   . RBBB   . Vertigo   . Wears dentures    partial lower    Past Surgical History:  Procedure Laterality Date  . AUGMENTATION MAMMAPLASTY Bilateral   . BREAST EXCISIONAL BIOPSY Left   . CARDIAC CATHETERIZATION  2003   Dr.Callwood, R/L heart cath,  . COLONOSCOPY WITH PROPOFOL N/A 06/12/2017   Procedure: COLONOSCOPY WITH PROPOFOL;  Surgeon: Lucilla Lame, MD;  Location: [place];  Service: Endoscopy;  Laterality: N/A;  . ESOPHAGEAL MANOMETRY N/A 06/19/2016   Procedure: ESOPHAGEAL MANOMETRY (EM);  Surgeon: Lucilla Lame, MD;  Location: [facility];  Service: Endoscopy;  Laterality: N/A;  . ESOPHAGOGASTRODUODENOSCOPY (EGD) WITH PROPOFOL N/A 04/03/2017   Procedure: ESOPHAGOGASTRODUODENOSCOPY (EGD) WITH PROPOFOL;  Surgeon: Lucilla Lame, MD;  Location: [place];  Service: Endoscopy;  Laterality: N/A;  diabetic - diet controlled  . THYROIDECTOMY  05/15/15   [facility]    Family History  Problem Relation Age of Onset  . Heart attack Mother   . Alcohol abuse Mother   . Mental illness Mother        died in her 51's of alzheimer's Dementia  .  Heart disease Mother 41  . Heart attack Father   . Heart disease Father 68       AMI - died @ 13.  . Colon cancer Paternal Grandmother 51    SOCIAL HX:  reports that she has never smoked. She has never used smokeless tobacco. She reports current alcohol use. She reports that she does not use drugs.   Current Outpatient Medications:  .  ALPRAZolam  (XANAX) 0.5 MG tablet, Take 1 tablet by mouth twice daily as needed for anxiety, Disp: 60 tablet, Rfl: 0 .  augmented betamethasone dipropionate (DIPROLENE-AF) 0.05 % ointment, APPLY TWICE DAILY TO LEGS AS NEEDED, Disp: , Rfl:  .  busPIRone (BUSPAR) 5 MG tablet, Take 1 tablet (5 mg total) by mouth 3 (three) times daily., Disp: 270 tablet, Rfl: 1 .  diltiazem (CARDIZEM CD) 120 MG 24 hr capsule, TAKE 1 CAPSULE BY MOUTH  DAILY, Disp: 90 capsule, Rfl: 3 .  ezetimibe (ZETIA) 10 MG tablet, TAKE 1 TABLET BY MOUTH  DAILY, Disp: 90 tablet, Rfl: 3 .  glucose blood test strip, For One touch Verio Flex .  Use to check 3 times daily   e11.9, Disp: 100 each, Rfl: 0 .  hyoscyamine (LEVSIN SL) 0.125 MG SL tablet, Place 1 tablet (0.125 mg total) under the tongue every 4 (four) hours as needed., Disp: 30 tablet, Rfl: 5 .  levothyroxine (SYNTHROID) 112 MCG tablet, TAKE 1 TABLET BY MOUTH  DAILY BEFORE BREAKFAST, Disp: 90 tablet, Rfl: 3 .  meclizine (ANTIVERT) 25 MG tablet, TAKE 1 TABLET BY MOUTH THREE TIMES DAILY AS NEEDED FOR VERTIGO, Disp: , Rfl:  .  nitroGLYCERIN (NITROSTAT) 0.4 MG SL tablet, DISSOLVE ONE TABLET UNDER THE TONGUE EVERY 5 MINUTES AS NEEDED FOR CHEST PAIN.  DO NOT EXCEED A TOTAL OF 3 DOSES IN 15 MINUTES, Disp: 20 tablet, Rfl: 3 .  omeprazole (PRILOSEC) 40 MG capsule, TAKE 1 CAPSULE BY MOUTH  TWICE DAILY, Disp: 180 capsule, Rfl: 3 .  sertraline (ZOLOFT) 100 MG tablet, TAKE 2 TABLETS BY MOUTH  DAILY, Disp: 180 tablet, Rfl: 3  EXAM:   General impression: alert, cooperative and articulate.  No signs of being in distress  Lungs: speech is fluent sentence length suggests that patient is not short of breath and not punctuated by cough, sneezing or sniffing. Marland Kitchen   Psych: affect normal.  speech is articulate and non pressured .  Denies suicidal thoughts   ASSESSMENT AND PLAN:  Discussed the following assessment and plan:  Other hyperlipidemia - Plan: Lipid panel  Hypovitaminosis D - Plan: VITAMIN D 25  Hydroxy (Vit-D Deficiency, Fractures)  Diabetes mellitus without complication (HCC) - Plan: Hemoglobin A1c, Comprehensive metabolic panel  Iatrogenic hypothyroidism - Plan: TSH  B12 deficiency - Plan: Vitamin B12, Intrinsic Factor Antibodies  OSA (obstructive sleep apnea)  Abnormal cells of cervix  Osteoporosis of femur without pathological fracture - Plan: DG Bone Density  OSA (obstructive sleep apnea) diagnosed with prior sleep study but treatment has been historically deferred due to mask intolerance due to MNG  Iatrogenic hypothyroidism Thyroid function  was WNL on current dose she has symptoms that improved with the addition of one additional  dose of levothyroxine 112 mcg  per week on Sundays to increase weekly cumulative dose to 896 mcg (similar to 125 mcg daily x 7).  Repeat TSH with next blood draw   Lab Results  Component Value Date   TSH 3.61 03/11/2019     Diabetes mellitus without complication ([place]) Historically  well-controlled on low GI diet, but has had some non adherence to diet due to the holidays. She prefers to defer testing until April  . Patient has no microalbuminuria. Patient is tolerating statin therapy for CAD risk reduction and on ACE/ARB for renal protection and hypertension .  Lab Results  Component Value Date   HGBA1C 7.1 (H) 03/11/2019   Lab Results  Component Value Date   MICROALBUR <0.7 03/11/2019     Abnormal cells of cervix She has continued surveillance by GYN due to history of LGSL and is due for PAP smear this year and has requested management by me.   Hyperlipidemia Managed with Zetia, LDL at goal Oct 2020   Lab Results  Component Value Date   CHOL 191 03/11/2019   HDL 74.90 03/11/2019   LDLCALC 106 (H) 03/11/2019   LDLDIRECT 93.0 01/02/2016   TRIG 51.0 03/11/2019   CHOLHDL 3 03/11/2019     Osteoporosis of femur without pathological fracture Last DEXA was 2019.  Will repeat this year and strongly advise treatment     I  discussed the assessment and treatment plan with the patient. The patient was provided an opportunity to ask questions and all were answered. The patient agreed with the plan and demonstrated an understanding of the instructions.   The patient was advised to call back or seek an in-person evaluation if the symptoms worsen or if the condition fails to improve as anticipated.  I provided  25 minutes of non-face-to-face time during this encounter reviewing patient's current problems and past procedures/imaging studies, providing counseling on the above mentioned problems , and coordination  of care .  Crecencio Mc, MD

## 2019-07-17 NOTE — Assessment & Plan Note (Signed)
Last DEXA was 2019.  Will repeat this year and strongly advise treatment

## 2019-07-17 NOTE — Assessment & Plan Note (Signed)
Managed with Zetia, LDL at goal Oct 2020   Lab Results  Component Value Date   CHOL 191 03/11/2019   HDL 74.90 03/11/2019   LDLCALC 106 (H) 03/11/2019   LDLDIRECT 93.0 01/02/2016   TRIG 51.0 03/11/2019   CHOLHDL 3 03/11/2019

## 2019-07-17 NOTE — Assessment & Plan Note (Signed)
Thyroid function  was WNL on current dose she has symptoms that improved with the addition of one additional  dose of levothyroxine 112 mcg  per week on Sundays to increase weekly cumulative dose to 896 mcg (similar to 125 mcg daily x 7).  Repeat TSH with next blood draw   Lab Results  Component Value Date   TSH 3.61 03/11/2019

## 2019-07-17 NOTE — Assessment & Plan Note (Signed)
Historically  well-controlled on low GI diet, but has had some non adherence to diet due to the holidays. She prefers to defer testing until April  . Patient has no microalbuminuria. Patient is tolerating statin therapy for CAD risk reduction and on ACE/ARB for renal protection and hypertension .  Lab Results  Component Value Date   HGBA1C 7.1 (H) 03/11/2019   Lab Results  Component Value Date   MICROALBUR <0.7 03/11/2019

## 2019-07-17 NOTE — Assessment & Plan Note (Addendum)
She has continued surveillance by GYN due to history of LGSL and is due for PAP smear this year and has requested management by me.

## 2019-07-17 NOTE — Assessment & Plan Note (Signed)
diagnosed with prior sleep study but treatment has been historically deferred due to mask intolerance due to MNG

## 2019-07-19 ENCOUNTER — Ambulatory Visit: Payer: Medicare Other | Attending: Internal Medicine

## 2019-07-19 DIAGNOSIS — G8929 Other chronic pain: Secondary | ICD-10-CM | POA: Insufficient documentation

## 2019-07-19 DIAGNOSIS — M25562 Pain in left knee: Secondary | ICD-10-CM | POA: Insufficient documentation

## 2019-07-19 DIAGNOSIS — M25561 Pain in right knee: Secondary | ICD-10-CM | POA: Insufficient documentation

## 2019-07-19 NOTE — Therapy (Signed)
[facility] [address], [place], [address] Phone: [phone]   Fax:  [phone]  Physical Therapy Treatment  Patient Details  Name: Megan Bradley MRN: [id number] Date of Birth: 29-Nov-1940 Referring Provider (PT): Crecencio Mc, MD   Encounter Date: 07/19/2019   Patient entered by mistake for this visitation session. Patient had to cancel.    Patient will benefit from skilled therapeutic intervention in order to improve the following deficits and impairments:     Visit Diagnosis: Chronic pain of left knee  Chronic pain of right knee     Problem List Patient Active Problem List   Diagnosis Date Noted  . Ruptured left breast implant, initial encounter 07/05/2018  . Ruptured silicone breast implant 07/05/2018  . Change in bowel habits 07/04/2018  . Knee pain, right 04/11/2018  . S/P breast implant, silicone [id number]  . Osteoporosis of femur without pathological fracture 03/11/2018  . 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 Tubular adenoma of colon   . Hot flushes, perimenopausal 01/05/2016  . RBBB   . Chest pain   . Orthostatic hypotension   . Shoulder pain, left 04/10/2015  . OSA (obstructive sleep apnea) 11/22/2013  . B12 deficiency 01/11/2013  . Hypovitaminosis D 01/11/2013  . Deviated septum 01/11/2013  . Hyperlipidemia 09/04/2011  . Abnormal cells of cervix 08/27/2011  . Multinodular goiter 08/27/2011  . Iatrogenic hypothyroidism   . Diabetes mellitus without complication ([place])   . GERD (gastroesophageal reflux disease)   . Anxiety with obsessional features     Blythe Stanford, PT DPT 07/19/2019, 3:48 PM  [facility] [address], [place], [address] Phone: [phone]   Fax:  [phone]  Name: Megan Bradley MRN: [id number] Date of Birth: 08/21/1940

## 2019-07-26 ENCOUNTER — Ambulatory Visit: Payer: Medicare Other

## 2019-08-02 ENCOUNTER — Ambulatory Visit: Payer: Medicare Other

## 2019-08-04 DIAGNOSIS — D0462 Carcinoma in situ of skin of left upper limb, including shoulder: Secondary | ICD-10-CM | POA: Diagnosis not present

## 2019-08-15 ENCOUNTER — Encounter: Payer: Self-pay | Admitting: Internal Medicine

## 2019-08-16 ENCOUNTER — Ambulatory Visit: Payer: Medicare Other

## 2019-08-17 ENCOUNTER — Ambulatory Visit (INDEPENDENT_AMBULATORY_CARE_PROVIDER_SITE_OTHER): Payer: Medicare Other

## 2019-08-17 ENCOUNTER — Other Ambulatory Visit: Payer: Self-pay

## 2019-08-17 VITALS — BP 120/78 | HR 61 | Ht 66.0 in | Wt 146.0 lb

## 2019-08-17 DIAGNOSIS — Z0000 Encounter for general adult medical examination without abnormal findings: Secondary | ICD-10-CM | POA: Diagnosis not present

## 2019-08-17 NOTE — Progress Notes (Addendum)
Subjective:   Megan Bradley is a 79 y.o. female who presents for Medicare Annual (Subsequent) preventive examination.  Review of Systems:  No ROS.  Medicare Wellness Virtual Visit.  Visual/audio telehealth visit. Vitals provided by patient. See social history for additional risk factors.   Cardiac Risk Factors include: advanced age (>45men, >32 women);diabetes mellitus     Objective:     Vitals: BP 120/78 (BP Location: Left Arm, Patient Position: Sitting, Cuff Size: Normal)   Pulse 61   Ht 5\' 6"  (1.676 m)   Wt 146 lb (66.2 kg)   BMI 23.57 kg/m   Body mass index is 23.57 kg/m.  Advanced Directives 08/17/2019 06/18/2018 06/17/2017 06/12/2017 04/03/2017 03/24/2017 05/27/2016  Does Patient Have a Medical Advance Directive? Yes Yes Yes Yes Yes Yes Yes  Type of Paramedic of [place];Living will [place];Living will [place];Living will [place];Living will [place];Living will [place];Living will [place];Living will  Does patient want to make changes to medical advance directive? No - Patient declined No - Patient declined No - Patient declined No - Patient declined - - -  Copy of [place] in Chart? No - copy requested No - copy requested No - copy requested No - copy requested No - copy requested No - copy requested No - copy requested    Tobacco Social History   Tobacco Use  Smoking Status Never Smoker  Smokeless Tobacco Never Used     Counseling given: Not Answered   Clinical Intake:  Pre-visit preparation completed: Yes        Diabetes: Yes(Followed by pcp)  How often do you need to have someone help you when you read instructions, pamphlets, or other written materials from your doctor or pharmacy?: 1 - Never  Interpreter Needed?: No     Past Medical History:  Diagnosis Date  . Anxiety   . Anxiety  disorder   . Cervical dysplasia    a. 2010 - high grade squamous intraepithelial lesion s/p excision.  . Chest pain    a. 2006 or 2007 Cath [facility]): reportedly nl cors;  b. 09/2011 ETT: nl.  . GERD (gastroesophageal reflux disease)   . Herpes zoster   . Hypothyroidism   . Orthostatic hypotension   . Pre-diabetes   . RBBB   . Vertigo   . Wears dentures    partial lower   Past Surgical History:  Procedure Laterality Date  . AUGMENTATION MAMMAPLASTY Bilateral   . BREAST EXCISIONAL BIOPSY Left   . CARDIAC CATHETERIZATION  2003   Dr.Callwood, R/L heart cath,  . COLONOSCOPY WITH PROPOFOL N/A 06/12/2017   Procedure: COLONOSCOPY WITH PROPOFOL;  Surgeon: Lucilla Lame, MD;  Location: [place];  Service: Endoscopy;  Laterality: N/A;  . ESOPHAGEAL MANOMETRY N/A 06/19/2016   Procedure: ESOPHAGEAL MANOMETRY (EM);  Surgeon: Lucilla Lame, MD;  Location: [facility];  Service: Endoscopy;  Laterality: N/A;  . ESOPHAGOGASTRODUODENOSCOPY (EGD) WITH PROPOFOL N/A 04/03/2017   Procedure: ESOPHAGOGASTRODUODENOSCOPY (EGD) WITH PROPOFOL;  Surgeon: Lucilla Lame, MD;  Location: [place];  Service: Endoscopy;  Laterality: N/A;  diabetic - diet controlled  . THYROIDECTOMY  05/15/15   [facility]   Family History  Problem Relation Age of Onset  . Heart attack Mother   . Alcohol abuse Mother   . Mental illness Mother        died in her 70's of alzheimer's Dementia  .  Heart disease Mother 83  . Heart attack Father   . Heart disease Father 44       AMI - died @ 18.  . Colon cancer Paternal Grandmother 52   Social History   Socioeconomic History  . Marital status: Married    Spouse name: Not on file  . Number of children: Not on file  . Years of education: Not on file  . Highest education level: Not on file  Occupational History  . Not on file  Tobacco Use  . Smoking status: Never Smoker  . Smokeless tobacco: Never Used  Substance and Sexual Activity  . Alcohol use: Yes     Comment: wine once a week  . Drug use: No  . Sexual activity: Never  Other Topics Concern  . Not on file  Social History Narrative   Lives in [place].  Active but doesn't exercise. Retired from [employer].   Social Determinants of Health   Financial Resource Strain:   . Difficulty of Paying Living Expenses: Not on file  Food Insecurity:   . Worried About Charity fundraiser in the Last Year: Not on file  . Ran Out of Food in the Last Year: Not on file  Transportation Needs:   . Lack of Transportation (Medical): Not on file  . Lack of Transportation (Non-Medical): Not on file  Physical Activity:   . Days of Exercise per Week: Not on file  . Minutes of Exercise per Session: Not on file  Stress:   . Feeling of Stress : Not on file  Social Connections:   . Frequency of Communication with Friends and Family: Not on file  . Frequency of Social Gatherings with Friends and Family: Not on file  . Attends Religious Services: Not on file  . Active Member of Clubs or Organizations: Not on file  . Attends Archivist Meetings: Not on file  . Marital Status: Not on file    Outpatient Encounter Medications as of 08/17/2019  Medication Sig  . ALPRAZolam (XANAX) 0.5 MG tablet Take 1 tablet by mouth twice daily as needed for anxiety  . augmented betamethasone dipropionate (DIPROLENE-AF) 0.05 % ointment APPLY TWICE DAILY TO LEGS AS NEEDED  . busPIRone (BUSPAR) 5 MG tablet Take 1 tablet (5 mg total) by mouth 3 (three) times daily.  Marland Kitchen diltiazem (CARDIZEM CD) 120 MG 24 hr capsule TAKE 1 CAPSULE BY MOUTH  DAILY  . ezetimibe (ZETIA) 10 MG tablet TAKE 1 TABLET BY MOUTH  DAILY  . glucose blood test strip For One touch Verio Flex .  Use to check 3 times daily   e11.9  . hyoscyamine (LEVSIN SL) 0.125 MG SL tablet Place 1 tablet (0.125 mg total) under the tongue every 4 (four) hours as needed.  Marland Kitchen levothyroxine (SYNTHROID) 112 MCG tablet TAKE 1 TABLET BY MOUTH  DAILY BEFORE BREAKFAST  . meclizine  (ANTIVERT) 25 MG tablet TAKE 1 TABLET BY MOUTH THREE TIMES DAILY AS NEEDED FOR VERTIGO  . nitroGLYCERIN (NITROSTAT) 0.4 MG SL tablet DISSOLVE ONE TABLET UNDER THE TONGUE EVERY 5 MINUTES AS NEEDED FOR CHEST PAIN.  DO NOT EXCEED A TOTAL OF 3 DOSES IN 15 MINUTES  . omeprazole (PRILOSEC) 40 MG capsule TAKE 1 CAPSULE BY MOUTH  TWICE DAILY  . sertraline (ZOLOFT) 100 MG tablet TAKE 2 TABLETS BY MOUTH  DAILY   No facility-administered encounter medications on file as of 08/17/2019.    Activities of Daily Living In your present state of health,  do you have any difficulty performing the following activities: 08/17/2019  Hearing? N  Vision? N  Difficulty concentrating or making decisions? N  Walking or climbing stairs? Y  Comment Chronic knee pain when walking long distances  Dressing or bathing? N  Doing errands, shopping? N  Preparing Food and eating ? N  Using the Toilet? N  In the past six months, have you accidently leaked urine? N  Do you have problems with loss of bowel control? N  Managing your Medications? N  Managing your Finances? N  Housekeeping or managing your Housekeeping? N  Some recent data might be hidden    Patient Care Team: Crecencio Mc, MD as PCP - General (Internal Medicine)    Assessment:   This is a routine wellness examination for [place].  Nurse connected with patient 08/17/19 at  9:30 AM EST by a telephone enabled telemedicine application and verified that I am speaking with the correct person using two identifiers. Patient stated full name and DOB. Patient gave permission to continue with virtual visit. Patient's location was at home and Nurse's location was at [place] office.   Patient is alert and oriented x3. Patient denies difficulty focusing or concentrating. Patient likes to read, play sodoku and is an active member on the [facility] board for brain stimulation.   Health Maintenance Due: -Foot Exam- denies wounds and changes with the feet.  -Hgb A1c- 03/11/19  (7.1) See completed HM at the end of note.   Eye: Visual acuity not assessed. Virtual visit. Followed by their ophthalmologist.  Retinopathy- none reported.  Dental: Visits every 6 months.    Hearing: Demonstrates normal hearing during visit.  Safety:  Patient feels safe at home- yes Patient does have smoke detectors at home- yes Patient does wear sunscreen or protective clothing when in direct sunlight - yes Patient does wear seat belt when in a moving vehicle - yes Patient drives- yes Adequate lighting in walkways free from debris- yes Grab bars and handrails used as appropriate- yes Ambulates with an assistive device- no  Social: Alcohol intake -yes      Smoking history- never   Smokers in home? none Illicit drug use? none  Medication: Taking as directed and without issues.  Self managed - yes   Covid-19: Precautions and sickness symptoms discussed. Wears mask, social distancing, hand hygiene as appropriate.   Activities of Daily Living Patient denies needing assistance with: household chores, feeding themselves, getting from bed to chair, getting to the toilet, bathing/showering, dressing, managing money, or preparing meals.   Discussed the importance of a healthy diet, water intake and the benefits of aerobic exercise.  Physical activity- Walking 1/2 mile daily  Diet:  Low carb Water: fair intake; encouraged to stay hydrated Caffeine: 3 cups of coffee  Other Providers Patient Care Team: Crecencio Mc, MD as PCP - General (Internal Medicine)  Exercise Activities and Dietary recommendations Current Exercise Habits: Home exercise routine, Type of exercise: walking, Intensity: Mild  Goals      Patient Stated   . Increased physical activity (pt-stated)     Quad exercises  Post physical therapy exercises       Fall Risk Fall Risk  08/17/2019 07/15/2019 04/06/2019 06/18/2018 06/17/2017  Falls in the past year? 0 0 0 0 No  Number falls in past yr: - - - - -    Injury with Fall? - - - - -  Comment - - - - -  Risk Factor Category  - - - - -  Comment - - - - -  Follow up Falls evaluation completed Falls evaluation completed Falls evaluation completed - -   Timed Get Up and Go performed: no, virtual visit  Depression Screen PHQ 2/9 Scores 08/17/2019 06/18/2018 06/17/2017 05/27/2016  PHQ - 2 Score 0 0 0 0  PHQ- 9 Score - - 0 -     Cognitive Function MMSE - Mini Mental State Exam 04/18/2015  Orientation to time 5  Orientation to Place 5  Registration 3  Attention/ Calculation 5  Recall 3  Language- name 2 objects 2  Language- repeat 1  Language- follow 3 step command 3  Language- read & follow direction 1  Write a sentence 1  Copy design 1  Total score 30     6CIT Screen 08/17/2019 06/18/2018 06/17/2017 05/27/2016  What Year? 0 points 0 points 0 points 0 points  What month? 0 points 0 points 0 points 0 points  What time? 0 points 0 points 0 points 0 points  Count back from 20 - 0 points 0 points 0 points  Months in reverse - 0 points 0 points 0 points  Repeat phrase - 0 points 0 points -  Total Score - 0 0 -    Immunization History  Administered Date(s) Administered  . DTaP 12/15/2011  . Fluad Quad(high Dose 65+) 02/25/2019  . Influenza Split 04/14/2011, 03/02/2012  . Influenza, High Dose Seasonal PF 02/18/2016, 03/23/2017, 03/29/2018  . Influenza,inj,Quad PF,6+ Mos 03/30/2013, 03/04/2014, 03/02/2015  . Influenza-Unspecified 12/20/2011, 03/28/2015  . PFIZER SARS-COV-2 Vaccination 06/13/2019, 07/04/2019  . Pneumococcal Conjugate-13 06/07/2014  . Pneumococcal Polysaccharide-23 06/07/2008  . Tdap 06/07/2012   Screening Tests Health Maintenance  Topic Date Due  . FOOT EXAM  05/21/2019  . HEMOGLOBIN A1C  09/09/2019  . URINE MICROALBUMIN  03/10/2020  . DEXA SCAN  03/11/2020  . OPHTHALMOLOGY EXAM  07/07/2020  . TETANUS/TDAP  06/07/2022  . INFLUENZA VACCINE  Completed  . PNA vac Low Risk Adult  Completed  . COLONOSCOPY   Discontinued      Plan:   Keep all routine maintenance appointments.   Medicare Attestation I have personally reviewed: The patient's medical and social history Their use of alcohol, tobacco or illicit drugs Their current medications and supplements The patient's functional ability including ADLs,fall risks, home safety risks, cognitive, and hearing and visual impairment Diet and physical activities Evidence for depression    I have reviewed and discussed with patient certain preventive protocols, quality metrics, and best practice recommendations.    OBrien-Blaney, Miyah Hampshire L, LPN  [id number]   I have reviewed the above information and agree with above.   Deborra Medina, MD

## 2019-08-17 NOTE — Patient Instructions (Addendum)
  Ms. Saffran , Thank you for taking time to come for your Medicare Wellness Visit. I appreciate your ongoing commitment to your health goals. Please review the following plan we discussed and let me know if I can assist you in the future.   These are the goals we discussed: Goals      Patient Stated   . Increased physical activity (pt-stated)     Quad exercises  Post physical therapy exercises       This is a list of the screening recommended for you and due dates:  Health Maintenance  Topic Date Due  . Complete foot exam   05/21/2019  . Hemoglobin A1C  09/09/2019  . Urine Protein Check  03/10/2020  . DEXA scan (bone density measurement)  03/11/2020  . Eye exam for diabetics  07/07/2020  . Tetanus Vaccine  06/07/2022  . Flu Shot  Completed  . Pneumonia vaccines  Completed  . Colon Cancer Screening  Discontinued

## 2019-08-22 ENCOUNTER — Other Ambulatory Visit: Payer: Self-pay | Admitting: Internal Medicine

## 2019-09-08 ENCOUNTER — Other Ambulatory Visit: Payer: Self-pay | Admitting: Internal Medicine

## 2019-09-08 DIAGNOSIS — Z1231 Encounter for screening mammogram for malignant neoplasm of breast: Secondary | ICD-10-CM

## 2019-09-23 ENCOUNTER — Other Ambulatory Visit: Payer: Self-pay

## 2019-09-23 ENCOUNTER — Ambulatory Visit
Admission: RE | Admit: 2019-09-23 | Discharge: 2019-09-23 | Disposition: A | Discharge status: Home / Self Care | Payer: Medicare Other | Source: Ambulatory Visit | Attending: Internal Medicine | Admitting: Internal Medicine

## 2019-09-23 DIAGNOSIS — Z1231 Encounter for screening mammogram for malignant neoplasm of breast: Secondary | ICD-10-CM | POA: Diagnosis not present

## 2019-10-04 ENCOUNTER — Telehealth: Payer: Self-pay

## 2019-10-04 NOTE — Telephone Encounter (Signed)
Patient called to state she had a mammogram 2 weeks ago which showed no abnormalities. However, she has since noticed a lump in her right breast. She would like to know what the next steps are.

## 2019-10-04 NOTE — Telephone Encounter (Signed)
Call to pt Pt was scheduled to see Dr. Marla Roe in Sept. For a f/u- however d/t pt finding a "lump" in her right  Breast- I consulted with Dr. Marla Roe & she did recommend that we see her sooner than her next appointment  We scheduled her for Friday 10/14/19 - she is aware of this appointment & agrees with this appointment  She had a mammogram on 09/23/19 - the report is in her chart

## 2019-10-05 NOTE — Telephone Encounter (Signed)
[place], RN discussed with patient.

## 2019-10-14 ENCOUNTER — Encounter: Payer: Self-pay | Admitting: Plastic Surgery

## 2019-10-14 ENCOUNTER — Other Ambulatory Visit: Payer: Self-pay

## 2019-10-14 ENCOUNTER — Ambulatory Visit: Payer: Medicare Other | Admitting: Plastic Surgery

## 2019-10-14 VITALS — BP 123/70 | HR 61 | Temp 97.1°F | Ht 66.0 in | Wt 151.8 lb

## 2019-10-14 DIAGNOSIS — N649 Disorder of breast, unspecified: Secondary | ICD-10-CM | POA: Insufficient documentation

## 2019-10-14 DIAGNOSIS — Z9882 Breast implant status: Secondary | ICD-10-CM

## 2019-10-14 HISTORY — DX: Disorder of breast, unspecified: N64.9

## 2019-10-14 NOTE — Progress Notes (Signed)
   Subjective:    Patient ID: Megan Bradley, female    DOB: 12-04-1940, 79 y.o.   MRN: [id number]  The patient is a 79 year old female here for follow-up on her breasts.  She underwent a mammogram for some concern about nodule that she felt.  There was one on each breast in the superior middle area.  Mammogram came back fine.  It does appear to be the implant and not any worrisome lesion.  I am able to it out when I push on the implant it does feel like it is likely a break in the capsule and the implant is poking through.  When I push on it the implants moves out.  It is likely rippling of the implant through the capsule.  It is not tender.  Is not getting worse.  The patient feels much better knowing it is not a serious issue.  She would like to continue to monitor it.  If there is any change we can certainly go with an MRI for further evaluation.     Review of Systems  Constitutional: Negative.  Negative for activity change and appetite change.  HENT: Negative.   Eyes: Negative.   Respiratory: Negative for chest tightness.   Cardiovascular: Negative.   Gastrointestinal: Negative.   Endocrine: Negative.   Genitourinary: Negative.   Musculoskeletal: Negative.   Skin: Negative.  Negative for color change and wound.  Psychiatric/Behavioral: Negative.        Objective:   Physical Exam Vitals and nursing note reviewed.  Constitutional:      Appearance: Normal appearance.  HENT:     Head: Normocephalic and atraumatic.  Cardiovascular:     Rate and Rhythm: Normal rate.  Pulmonary:     Effort: Pulmonary effort is normal.  Chest:    Abdominal:     General: Abdomen is flat.  Neurological:     General: No focal deficit present.     Mental Status: She is alert and oriented to person, place, and time.  Psychiatric:        Mood and Affect: Mood normal.        Thought Content: Thought content normal.        Assessment & Plan:     ICD-10-CM   1. S/P breast implant, silicone   [id number]   2. Breast disorder in female  N71.9     Plan for 1 year follow-up.  If there is any progression or change then we will opt for the MRI of the breast.  Patient is pleased with the plan.  Pictures were obtained of the patient and placed in the chart with the patient's or guardian's permission.

## 2019-10-18 ENCOUNTER — Other Ambulatory Visit: Payer: Self-pay

## 2019-10-18 ENCOUNTER — Encounter: Payer: Self-pay | Admitting: Internal Medicine

## 2019-10-18 ENCOUNTER — Ambulatory Visit (INDEPENDENT_AMBULATORY_CARE_PROVIDER_SITE_OTHER): Payer: Medicare Other | Admitting: Internal Medicine

## 2019-10-18 VITALS — BP 132/80 | HR 58 | Temp 97.0°F | Resp 15 | Ht 66.0 in | Wt 151.8 lb

## 2019-10-18 DIAGNOSIS — E7849 Other hyperlipidemia: Secondary | ICD-10-CM | POA: Diagnosis not present

## 2019-10-18 DIAGNOSIS — L03115 Cellulitis of right lower limb: Secondary | ICD-10-CM | POA: Diagnosis not present

## 2019-10-18 DIAGNOSIS — E119 Type 2 diabetes mellitus without complications: Secondary | ICD-10-CM | POA: Diagnosis not present

## 2019-10-18 DIAGNOSIS — R630 Anorexia: Secondary | ICD-10-CM | POA: Diagnosis not present

## 2019-10-18 DIAGNOSIS — L02415 Cutaneous abscess of right lower limb: Secondary | ICD-10-CM | POA: Diagnosis not present

## 2019-10-18 DIAGNOSIS — E032 Hypothyroidism due to medicaments and other exogenous substances: Secondary | ICD-10-CM | POA: Diagnosis not present

## 2019-10-18 DIAGNOSIS — E559 Vitamin D deficiency, unspecified: Secondary | ICD-10-CM | POA: Diagnosis not present

## 2019-10-18 DIAGNOSIS — G8929 Other chronic pain: Secondary | ICD-10-CM

## 2019-10-18 DIAGNOSIS — M25561 Pain in right knee: Secondary | ICD-10-CM | POA: Diagnosis not present

## 2019-10-18 DIAGNOSIS — E538 Deficiency of other specified B group vitamins: Secondary | ICD-10-CM | POA: Diagnosis not present

## 2019-10-18 DIAGNOSIS — M25562 Pain in left knee: Secondary | ICD-10-CM

## 2019-10-18 MED ORDER — CEPHALEXIN 500 MG PO CAPS: 500.0000 mg | ORAL_CAPSULE | Freq: Four times a day (QID) | ORAL | 0 refills | Status: DC

## 2019-10-18 NOTE — Assessment & Plan Note (Signed)
She is requesting bilateral steroid injections by me . Most recent I/A injections several months ago by orthopedic PA were done via lateral approach and did not relieve pain

## 2019-10-18 NOTE — Progress Notes (Signed)
Subjective:  Patient ID: Megan Bradley, female    DOB: 1941/02/12  Age: 79 y.o. MRN: [id number]  CC: The primary encounter diagnosis was Decreased appetite. Diagnoses of B12 deficiency, Hypovitaminosis D, Other hyperlipidemia, Iatrogenic hypothyroidism, Diabetes mellitus without complication ([place]), Bilateral chronic knee pain, and Cellulitis and abscess of right leg were also pertinent to this visit.  HPI Megan Bradley presents for follow up on type 2 diabetes,  GAD,  Hypothyroidism,  Hyperlipidemia    Patient has received both doses of the available COVID 19 vaccine without complications.  Patient continues to mask when outside of the home except when walking in yard or at safe distances from others .  Patient denies any change in mood or development of unhealthy behaviors resulting from the pandemic's restriction of activities and socialization.   Hypothyroidism :  She has been taking Taking 2 thyroid tabs on mondays   And one daily thereafter    Many many complaints.  Fatigue, dysphagia,  Loss of balance.  Chronic knee pain   Knee pain was treated with lateral steroid injections done by [facility] several months ago,   serially one week apart.  Did not help her pain at all, actually made them worse. Not exercising due to pain.    T2DM:  She is not  exercising regularly. Checking  blood sugars less than once daily at variable times, usually only if she feels she may be having a hypoglycemic event. .  BS have been under 120 fasting and < 150 post prandially.  Denies any recent hypoglyemic events.  Taking   medications as directed. Following a carbohydrate modified diet 6 days per week. Denies numbness, burning and tingling of extremities. Appetite is good.   Had 8 PT sessions for balance issues.. Pt felt that her hips were not aligned  Left  Foot /ankle has been swelling for the past month.  Resoles by morning after resting   Scabbed over lesions on leg,  Itching secondary to  atopic  dermatitis   Looks infected    Nutcracker esophagus:  Taking diltiazem regularly  Spasms still ocurring monthly  With small pills being ejected through nose,  Occasional choking/   Outpatient Medications Prior to Visit  Medication Sig Dispense Refill  . ALPRAZolam (XANAX) 0.5 MG tablet Take 1 tablet by mouth twice daily as needed for anxiety 60 tablet 2  . augmented betamethasone dipropionate (DIPROLENE-AF) 0.05 % ointment APPLY TWICE DAILY TO LEGS AS NEEDED    . diltiazem (CARDIZEM CD) 120 MG 24 hr capsule TAKE 1 CAPSULE BY MOUTH  DAILY 90 capsule 3  . ezetimibe (ZETIA) 10 MG tablet TAKE 1 TABLET BY MOUTH  DAILY 90 tablet 3  . glucose blood test strip For One touch Verio Flex .  Use to check 3 times daily   e11.9 100 each 0  . hyoscyamine (LEVSIN SL) 0.125 MG SL tablet Place 1 tablet (0.125 mg total) under the tongue every 4 (four) hours as needed. 30 tablet 5  . levothyroxine (SYNTHROID) 112 MCG tablet TAKE 1 TABLET BY MOUTH  DAILY BEFORE BREAKFAST 90 tablet 3  . meclizine (ANTIVERT) 25 MG tablet TAKE 1 TABLET BY MOUTH THREE TIMES DAILY AS NEEDED FOR VERTIGO    . nitroGLYCERIN (NITROSTAT) 0.4 MG SL tablet DISSOLVE ONE TABLET UNDER THE TONGUE EVERY 5 MINUTES AS NEEDED FOR CHEST PAIN.  DO NOT EXCEED A TOTAL OF 3 DOSES IN 15 MINUTES 20 tablet 3  . omeprazole (PRILOSEC) 40 MG capsule  TAKE 1 CAPSULE BY MOUTH  TWICE DAILY 180 capsule 3  . sertraline (ZOLOFT) 100 MG tablet TAKE 2 TABLETS BY MOUTH  DAILY 180 tablet 3   No facility-administered medications prior to visit.    Review of Systems;  Patient denies headache, fevers, malaise, unintentional weight loss, skin rash, eye pain, sinus congestion and sinus pain, sore throat, dysphagia,  hemoptysis , cough, dyspnea, wheezing, chest pain, palpitations, orthopnea, edema, abdominal pain, nausea, melena, diarrhea, constipation, flank pain, dysuria, hematuria, urinary  Frequency, nocturia, numbness, tingling, seizures,  Focal weakness, Loss of  consciousness,  Tremor, insomnia, depression, anxiety, and suicidal ideation.      Objective:  BP 132/80 (BP Location: Left Arm, Patient Position: Sitting, Cuff Size: Normal)   Pulse (!) 58   Temp (!) 97 F (36.1 C) (Temporal)   Resp 15   Ht 5\' 6"  (1.676 m)   Wt 151 lb 12.8 oz (68.9 kg)   SpO2 96%   BMI 24.50 kg/m   BP Readings from Last 3 Encounters:  10/18/19 132/80  10/14/19 123/70  08/17/19 120/78    Wt Readings from Last 3 Encounters:  10/18/19 151 lb 12.8 oz (68.9 kg)  10/14/19 151 lb 12.8 oz (68.9 kg)  08/17/19 146 lb (66.2 kg)    General appearance: alert, cooperative and appears stated age Ears: normal TM's and external ear canals both ears Throat: lips, mucosa, and tongue normal; teeth and gums normal Neck: no adenopathy, no carotid bruit, supple, symmetrical, trachea midline and thyroid not enlarged, symmetric, no tenderness/mass/nodules Back: symmetric, no curvature. ROM normal. No CVA tenderness. Lungs: clear to auscultation bilaterally Heart: regular rate and rhythm, S1, S2 normal, no murmur, click, rub or gallop Abdomen: soft, non-tender; bowel sounds normal; no masses,  no organomegaly Pulses: 2+ and symmetric Skin: multiple excoriated lesions right leg > left.  Lesions each surrounded by halo of erythema .  Left ankle non pitting edema     Assessment and Plan:   B12 deficiency She has been taking an oral supplement. Repeat level is pending   Lab Results  Component Value Date   [id number] 07/02/2018     Diabetes mellitus without complication ([place]) Historically  well-controlled on low GI diet, but has had some non adherence to diet due to the holidays. She has deferred testing until now . Patient has no microalbuminuria. Patient is tolerating statin therapy for CAD risk reduction and on ACE/ARB for renal protection and hypertension .  Lab Results  Component Value Date   HGBA1C 7.1 (H) 03/11/2019   Lab Results  Component Value Date    MICROALBUR <0.7 03/11/2019     Iatrogenic hypothyroidism Thyroid function  was WNL on current dose but she has symptoms that improved with the addition of one additional  dose of levothyroxine 112 mcg  per week on Sundays to increase weekly cumulative dose to 896 mcg (similar to 125 mcg daily x 7).  Repeat TSH is due    Lab Results  Component Value Date   TSH 3.61 03/11/2019     Bilateral chronic knee pain She is requesting bilateral steroid injections by me . Most recent I/A injections several months ago by orthopedic PA were done via lateral approach and did not relieve pain  Cellulitis and abscess of right leg Right leg has multiple excoriated lesions that appear to have early infection.  Cephalexin prescribed.    Updated Medication List Outpatient Encounter Medications as of 10/18/2019  Medication Sig  . ALPRAZolam (XANAX) 0.5 MG tablet  Take 1 tablet by mouth twice daily as needed for anxiety  . augmented betamethasone dipropionate (DIPROLENE-AF) 0.05 % ointment APPLY TWICE DAILY TO LEGS AS NEEDED  . diltiazem (CARDIZEM CD) 120 MG 24 hr capsule TAKE 1 CAPSULE BY MOUTH  DAILY  . ezetimibe (ZETIA) 10 MG tablet TAKE 1 TABLET BY MOUTH  DAILY  . glucose blood test strip For One touch Verio Flex .  Use to check 3 times daily   e11.9  . hyoscyamine (LEVSIN SL) 0.125 MG SL tablet Place 1 tablet (0.125 mg total) under the tongue every 4 (four) hours as needed.  Marland Kitchen levothyroxine (SYNTHROID) 112 MCG tablet TAKE 1 TABLET BY MOUTH  DAILY BEFORE BREAKFAST  . meclizine (ANTIVERT) 25 MG tablet TAKE 1 TABLET BY MOUTH THREE TIMES DAILY AS NEEDED FOR VERTIGO  . nitroGLYCERIN (NITROSTAT) 0.4 MG SL tablet DISSOLVE ONE TABLET UNDER THE TONGUE EVERY 5 MINUTES AS NEEDED FOR CHEST PAIN.  DO NOT EXCEED A TOTAL OF 3 DOSES IN 15 MINUTES  . omeprazole (PRILOSEC) 40 MG capsule TAKE 1 CAPSULE BY MOUTH  TWICE DAILY  . sertraline (ZOLOFT) 100 MG tablet TAKE 2 TABLETS BY MOUTH  DAILY  . cephALEXin (KEFLEX) 500  MG capsule Take 1 capsule (500 mg total) by mouth 4 (four) times daily.   No facility-administered encounter medications on file as of 10/18/2019.

## 2019-10-18 NOTE — Assessment & Plan Note (Signed)
Historically  well-controlled on low GI diet, but has had some non adherence to diet due to the holidays. She has deferred testing until now . Patient has no microalbuminuria. Patient is tolerating statin therapy for CAD risk reduction and on ACE/ARB for renal protection and hypertension .  Lab Results  Component Value Date   HGBA1C 7.1 (H) 03/11/2019   Lab Results  Component Value Date   MICROALBUR <0.7 03/11/2019

## 2019-10-18 NOTE — Assessment & Plan Note (Signed)
She has been taking an oral supplement. Repeat level is pending   Lab Results  Component Value Date   VITAMINB12 596 07/02/2018

## 2019-10-18 NOTE — Assessment & Plan Note (Signed)
Thyroid function  was WNL on current dose but she has symptoms that improved with the addition of one additional  dose of levothyroxine 112 mcg  per week on Sundays to increase weekly cumulative dose to 896 mcg (similar to 125 mcg daily x 7).  Repeat TSH is due    Lab Results  Component Value Date   TSH 3.61 03/11/2019

## 2019-10-18 NOTE — Patient Instructions (Signed)
Your right leg lookS INFECTED FROM YOUR PICKING  KEFLEX ANTIBIOTIC 3-4 TIMES DAILY X 7 DAYS   KEEP WOUNDS COVERED    COMPRESSION STOCKING FOR LEFT LEG ONLY

## 2019-10-18 NOTE — Assessment & Plan Note (Signed)
Right leg has multiple excoriated lesions that appear to have early infection.  Cephalexin prescribed.

## 2019-10-19 LAB — COMPREHENSIVE METABOLIC PANEL
ALT: 22 U/L (ref 0–35)
AST: 20 U/L (ref 0–37)
Albumin: 4.2 g/dL (ref 3.5–5.2)
Alkaline Phosphatase: 74 U/L (ref 39–117)
BUN: 20 mg/dL (ref 6–23)
CO2: 27 mEq/L (ref 19–32)
Calcium: 9.3 mg/dL (ref 8.4–10.5)
Chloride: 102 mEq/L (ref 96–112)
Creatinine, Ser: 1.02 mg/dL (ref 0.40–1.20)
GFR: 52.51 mL/min — ABNORMAL LOW (ref >60.00)
Glucose, Bld: 93 mg/dL (ref 70–99)
Potassium: 4 mEq/L (ref 3.5–5.1)
Sodium: 139 mEq/L (ref 135–145)
Total Bilirubin: 0.3 mg/dL (ref 0.2–1.2)
Total Protein: 7 g/dL (ref 6.0–8.3)

## 2019-10-19 LAB — VITAMIN D 25 HYDROXY (VIT D DEFICIENCY, FRACTURES): VITD: 28.06 ng/mL — ABNORMAL LOW (ref 30.00–100.00)

## 2019-10-19 LAB — LIPID PANEL
Cholesterol: 196 mg/dL (ref 0–200)
HDL: 79 mg/dL (ref >39.00)
LDL Cholesterol: 102 mg/dL — ABNORMAL HIGH (ref 0–99)
NonHDL: 116.63
Total CHOL/HDL Ratio: 2
Triglycerides: 74 mg/dL (ref 0.0–149.0)
VLDL: 14.8 mg/dL (ref 0.0–40.0)

## 2019-10-19 LAB — MAGNESIUM: Magnesium: 1.9 mg/dL (ref 1.5–2.5)

## 2019-10-19 LAB — TSH: TSH: 0.22 u[IU]/mL — ABNORMAL LOW (ref 0.35–4.50)

## 2019-10-19 LAB — ZINC: Zinc: 63 ug/dL (ref 60–130)

## 2019-10-19 LAB — HEMOGLOBIN A1C: Hgb A1c MFr Bld: 6.7 % — ABNORMAL HIGH (ref 4.6–6.5)

## 2019-10-19 LAB — VITAMIN B12: Vitamin B-12: 400 pg/mL (ref 211–911)

## 2019-10-20 LAB — INTRINSIC FACTOR ANTIBODIES: Intrinsic Factor: NEGATIVE

## 2019-10-31 DIAGNOSIS — M25572 Pain in left ankle and joints of left foot: Secondary | ICD-10-CM

## 2019-11-01 NOTE — Telephone Encounter (Signed)
LM on both numbers to call back to schedule xrays here at office.

## 2019-11-02 ENCOUNTER — Telehealth: Payer: Self-pay

## 2019-11-02 ENCOUNTER — Other Ambulatory Visit: Payer: Medicare Other

## 2019-11-02 ENCOUNTER — Other Ambulatory Visit: Payer: Self-pay

## 2019-11-02 ENCOUNTER — Ambulatory Visit (INDEPENDENT_AMBULATORY_CARE_PROVIDER_SITE_OTHER): Payer: Medicare Other

## 2019-11-02 DIAGNOSIS — M25571 Pain in right ankle and joints of right foot: Secondary | ICD-10-CM | POA: Diagnosis not present

## 2019-11-02 DIAGNOSIS — M7989 Other specified soft tissue disorders: Secondary | ICD-10-CM | POA: Diagnosis not present

## 2019-11-02 DIAGNOSIS — M25572 Pain in left ankle and joints of left foot: Secondary | ICD-10-CM | POA: Diagnosis not present

## 2019-11-02 NOTE — Telephone Encounter (Signed)
Close  

## 2019-11-02 NOTE — Telephone Encounter (Signed)
Patient scheduled for xrays today at 11:30.

## 2019-12-01 ENCOUNTER — Other Ambulatory Visit: Payer: Self-pay

## 2019-12-01 ENCOUNTER — Ambulatory Visit (INDEPENDENT_AMBULATORY_CARE_PROVIDER_SITE_OTHER): Payer: Medicare Other | Admitting: Internal Medicine

## 2019-12-01 ENCOUNTER — Encounter: Payer: Self-pay | Admitting: Internal Medicine

## 2019-12-01 VITALS — BP 122/76 | HR 55 | Temp 96.6°F | Ht 65.98 in | Wt 155.2 lb

## 2019-12-01 DIAGNOSIS — G8929 Other chronic pain: Secondary | ICD-10-CM

## 2019-12-01 DIAGNOSIS — M25561 Pain in right knee: Secondary | ICD-10-CM | POA: Diagnosis not present

## 2019-12-01 DIAGNOSIS — M25562 Pain in left knee: Secondary | ICD-10-CM | POA: Diagnosis not present

## 2019-12-01 DIAGNOSIS — E538 Deficiency of other specified B group vitamins: Secondary | ICD-10-CM

## 2019-12-01 DIAGNOSIS — M545 Low back pain, unspecified: Secondary | ICD-10-CM

## 2019-12-01 MED ORDER — LIDOCAINE HCL 1 % IJ SOLN
4.0000 mL | Freq: Once | INTRAMUSCULAR | Status: AC
  Administered 2019-12-01: 4 mL

## 2019-12-01 MED ORDER — TRIAMCINOLONE ACETONIDE 40 MG/ML IJ SUSP
40.0000 mg | Freq: Once | INTRAMUSCULAR | Status: AC
  Administered 2019-12-01: 40 mg via INTRAMUSCULAR

## 2019-12-01 NOTE — Progress Notes (Addendum)
Subjective:  Patient ID: Megan Bradley, female    DOB: [birth year]  Age: 79 y.o. MRN: [id number]  CC: The primary encounter diagnosis was Acute bilateral low back pain without sciatica. Diagnoses of Bilateral chronic knee pain and B12 deficiency were also pertinent to this visit.  HPI Megan Bradley presents for 1) bilateral knee injections  2) B12 injection   3) evaluation of bilateral low back pain , acute  This visit occurred during the SARS-CoV-2 public health emergency.  Safety protocols were in place, including screening questions prior to the visit, additional usage of staff PPE, and extensive cleaning of exam room while observing appropriate contact time as indicated for disinfecting solutions.   1) She has chronic bilateral knee pain due to DJD and is requesting steroid injections .  Her last injections were over 3 months ago by Orthopedic PA and were done on the lateral side and did not relieve her pain.  She is noting difficulty getting up from the floor if she kneels.  2) acute onset of low back pain in the SI joint area.  No recent fall,  Pain does not radiate.  Does not exercise regularly.  No recent unusual activity.  Has had PT in the distant past which has helped.   Outpatient Medications Prior to Visit  Medication Sig Dispense Refill  . ALPRAZolam (XANAX) 0.5 MG tablet Take 1 tablet by mouth twice daily as needed for anxiety 60 tablet 2  . diltiazem (CARDIZEM CD) 120 MG 24 hr capsule TAKE 1 CAPSULE BY MOUTH  DAILY 90 capsule 3  . ezetimibe (ZETIA) 10 MG tablet TAKE 1 TABLET BY MOUTH  DAILY 90 tablet 3  . glucose blood test strip For One touch Verio Flex .  Use to check 3 times daily   e11.9 100 each 0  . hyoscyamine (LEVSIN SL) 0.125 MG SL tablet Place 1 tablet (0.125 mg total) under the tongue every 4 (four) hours as needed. 30 tablet 5  . levothyroxine (SYNTHROID) 112 MCG tablet TAKE 1 TABLET BY MOUTH  DAILY BEFORE BREAKFAST 90 tablet 3  . meclizine (ANTIVERT) 25 MG tablet  TAKE 1 TABLET BY MOUTH THREE TIMES DAILY AS NEEDED FOR VERTIGO    . nitroGLYCERIN (NITROSTAT) 0.4 MG SL tablet DISSOLVE ONE TABLET UNDER THE TONGUE EVERY 5 MINUTES AS NEEDED FOR CHEST PAIN.  DO NOT EXCEED A TOTAL OF 3 DOSES IN 15 MINUTES 20 tablet 3  . sertraline (ZOLOFT) 100 MG tablet TAKE 2 TABLETS BY MOUTH  DAILY 180 tablet 3  . augmented betamethasone dipropionate (DIPROLENE-AF) 0.05 % ointment APPLY TWICE DAILY TO LEGS AS NEEDED (Patient not taking: Reported on 12/01/2019)    . cephALEXin (KEFLEX) 500 MG capsule Take 1 capsule (500 mg total) by mouth 4 (four) times daily. (Patient not taking: Reported on 12/01/2019) 28 capsule 0  . omeprazole (PRILOSEC) 40 MG capsule TAKE 1 CAPSULE BY MOUTH  TWICE DAILY (Patient not taking: Reported on 12/01/2019) 180 capsule 3   No facility-administered medications prior to visit.    Review of Systems;  Patient denies headache, fevers, malaise, unintentional weight loss, skin rash, eye pain, sinus congestion and sinus pain, sore throat, dysphagia,  hemoptysis , cough, dyspnea, wheezing, chest pain, palpitations, orthopnea, edema, abdominal pain, nausea, melena, diarrhea, constipation, flank pain, dysuria, hematuria, urinary  Frequency, nocturia, numbness, tingling, seizures,  Focal weakness, Loss of consciousness,  Tremor, insomnia, depression, anxiety, and suicidal ideation.      Objective:  BP 122/76 (BP Location: Left  Arm, Patient Position: Sitting)   Pulse (!) 55   Temp (!) 96.6 F (35.9 C)   Ht 5' 5.98" (1.676 m)   Wt 155 lb 3.2 oz (70.4 kg)   SpO2 97%   BMI 25.06 kg/m   BP Readings from Last 3 Encounters:  12/01/19 122/76  10/18/19 132/80  10/14/19 123/70    Wt Readings from Last 3 Encounters:  12/01/19 155 lb 3.2 oz (70.4 kg)  10/18/19 151 lb 12.8 oz (68.9 kg)  10/14/19 151 lb 12.8 oz (68.9 kg)    General appearance: alert, cooperative and appears stated age Ears: normal TM's and external ear canals both ears Throat: lips, mucosa,  and tongue normal; teeth and gums normal Neck: no adenopathy, no carotid bruit, supple, symmetrical, trachea midline and thyroid not enlarged, symmetric, no tenderness/mass/nodules Back: symmetric, no curvature. ROM normal. No CVA tenderness. Lungs: clear to auscultation bilaterally Heart: regular rate and rhythm, S1, S2 normal, no murmur, click, rub or gallop Abdomen: soft, non-tender; bowel sounds normal; no masses,  no organomegaly Pulses: 2+ and symmetric Skin: Skin color, texture, turgor normal. No rashes or lesions Ext: knees without redness,  Skin breakdown, or effusions Lymph nodes: Cervical, supraclavicular, and axillary nodes normal. MSK: no spinal tenderness.  Some paraspinus muscle and SI joint tenderness bilaterally .  Negative straight leg lift.   Lab Results  Component Value Date   HGBA1C 6.7 (H) 10/18/2019   HGBA1C 7.1 (H) 03/11/2019   HGBA1C 6.8 (H) 07/02/2018    Lab Results  Component Value Date   CREATININE 1.02 10/18/2019   CREATININE 0.95 03/11/2019   CREATININE 1.03 12/30/2017    Lab Results  Component Value Date   WBC 7.2 03/11/2019   HGB 13.0 03/11/2019   HCT 39.0 03/11/2019   PLT 172.0 03/11/2019   GLUCOSE 93 10/18/2019   CHOL 196 10/18/2019   TRIG 74.0 10/18/2019   HDL 79.00 10/18/2019   LDLDIRECT 93.0 01/02/2016   LDLCALC 102 (H) 10/18/2019   ALT 22 10/18/2019   AST 20 10/18/2019   NA 139 10/18/2019   K 4.0 10/18/2019   CL 102 10/18/2019   CREATININE 1.02 10/18/2019   BUN 20 10/18/2019   CO2 27 10/18/2019   TSH 0.22 (L) 10/18/2019   INR 0.9 12/05/2011   HGBA1C 6.7 (H) 10/18/2019   MICROALBUR <0.7 03/11/2019    MM 3D SCREEN BREAST W/IMPLANT BILATERAL  Result Date: 09/23/2019 CLINICAL DATA:  Screening. EXAM: DIGITAL SCREENING BILATERAL MAMMOGRAM WITH IMPLANTS, CAD AND TOMO The patient has prepectoral implants. Standard and implant displaced views were performed. COMPARISON:  Previous exam(s). ACR Breast Density Category b: There are  scattered areas of fibroglandular density. FINDINGS: There are no findings suspicious for malignancy. Bilateral prepectoral silicone implants are noted. Images were processed with CAD. IMPRESSION: No mammographic evidence of malignancy. A result letter of this screening mammogram will be mailed directly to the patient. RECOMMENDATION: Screening mammogram in one year. (Code:SM-B-01Y) BI-RADS CATEGORY  1:  Negative. Electronically Signed   By: Zerita Boers M.D.   On: 09/23/2019 13:47    Assessment & Plan:   Problem List Items Addressed This Visit      Unprioritized   B12 deficiency    IM injection was given today in office  Lab Results  Component Value Date   VITAMINB12 400 10/18/2019         Bilateral chronic knee pain    She is requesting bilateral steroid injections by me . Most recent I/A injections several months ago  by orthopedic PA were done via lateral approach and did not relieve pain After obtaining informed consent for  an I/A injection of both knees,  The left  knee was cleaned with betadine and alcohol.  Topical anesthetic was sprayed on medial side of patella and 40 mg Kenalog mixed with 4 ml 1% lidocaine was injected without difficulty into the bursa,  Patient tolerated the procedure without complications or bleeding.  The same procedure was done on the right knee using 40 mg Kenalog mixed with 4 ml 1% lidocaine . Patient was advised to apply ice for 15 minutes every few hours for the first 24 hours and to avoid strenuous activity for up to 7 days        Low back pain - Primary    Her pain is not midline, but more in the paraspinus muscles and SI joints..  pT referral made       Relevant Orders   Ambulatory referral to Physical Therapy      I have discontinued Vaughan Basta B. Bench's augmented betamethasone dipropionate, omeprazole, and cephALEXin. I am also having her maintain her glucose blood, meclizine, hyoscyamine, nitroGLYCERIN, ezetimibe, diltiazem, levothyroxine,  sertraline, and ALPRAZolam. We administered triamcinolone acetonide, lidocaine, triamcinolone acetonide, lidocaine, and cyanocobalamin.  Meds ordered this encounter  Medications  . triamcinolone acetonide (KENALOG-40) injection 40 mg  . lidocaine (XYLOCAINE) 1 % (with pres) injection 4 mL  . triamcinolone acetonide (KENALOG-40) injection 40 mg  . lidocaine (XYLOCAINE) 1 % (with pres) injection 4 mL  . cyanocobalamin ((VITAMIN B-12)) injection 1,000 mcg    Medications Discontinued During This Encounter  Medication Reason  . cephALEXin (KEFLEX) 500 MG capsule Completed Course  . omeprazole (PRILOSEC) 40 MG capsule Completed Course  . augmented betamethasone dipropionate (DIPROLENE-AF) 0.05 % ointment Completed Course    Follow-up: No follow-ups on file.   Crecencio Mc, MD

## 2019-12-01 NOTE — Patient Instructions (Signed)
I have made the PT referral to [facility] your knees for 15 minutes every 4 to 6 hours over the next 24 to 48 hours   Knee Injection A knee injection is a procedure to get medicine into your knee joint to relieve the pain, swelling, and stiffness of arthritis. Your health care provider uses a needle to inject medicine, which may also help to lubricate and cushion your knee joint. You may need more than one injection. Tell a health care provider about:  Any allergies you have.  All medicines you are taking, including vitamins, herbs, eye drops, creams, and over-the-counter medicines.  Any problems you or family members have had with anesthetic medicines.  Any blood disorders you have.  Any surgeries you have had.  Any medical conditions you have.  Whether you are pregnant or may be pregnant. What are the risks? Generally, this is a safe procedure. However, problems may occur, including:  Infection.  Bleeding.  Symptoms that get worse.  Damage to the area around your knee.  Allergic reaction to any of the medicines.  Skin reactions from repeated injections. What happens before the procedure?  Ask your health care provider about changing or stopping your regular medicines. This is especially important if you are taking diabetes medicines or blood thinners.  Plan to have someone take you home from the hospital or clinic. What happens during the procedure?   You will sit or lie down in a position for your knee to be treated.  The skin over your kneecap will be cleaned with a germ-killing soap.  You will be given a medicine that numbs the area (local anesthetic). You may feel some stinging.  The medicine will be injected into your knee. The needle is carefully placed between your kneecap and your knee. The medicine is injected into the joint space.  The needle will be removed at the end of the procedure.  A bandage (dressing) may be placed over the injection  site. The procedure may vary among health care providers and hospitals. What can I expect after the procedure?  Your blood pressure, heart rate, breathing rate, and blood oxygen level will be monitored until you leave the hospital or clinic.  You may have to move your knee through its full range of motion. This helps to get all the medicine into your joint space.  You will be watched to make sure that you do not have a reaction to the injected medicine.  You may feel more pain, swelling, and warmth than you did before the injection. This reaction may last about 1-2 days. Follow these instructions at home: Medicines  Take over-the-counter and prescription medicines only as told by your doctor.  Do not drive or use heavy machinery while taking prescription pain medicine.  Do not take medicines such as aspirin and ibuprofen unless your health care provider tells you to take them. Injection site care  Follow instructions from your health care provider about: ? How to take care of your puncture site. ? When and how you should change your dressing. ? When you should remove your dressing.  Check your injection area every day for signs of infection. Check for: ? More redness, swelling, or pain after 2 days. ? Fluid or blood. ? Pus or a bad smell. ? Warmth. Managing pain, stiffness, and swelling   If directed, put ice on the injection area: ? Put ice in a plastic bag. ? Place a towel between your skin and the  bag. ? Leave the ice on for 20 minutes, 2-3 times per day.  Do not apply heat to your knee.  Raise (elevate) the injection area above the level of your heart while you are sitting or lying down. General instructions  If you were given a dressing, keep it dry until your health care provider says it can be removed. Ask your health care provider when you can start showering or taking a bath.  Avoid strenuous activities for as long as directed by your health care provider. Ask  your health care provider when you can return to your normal activities.  Keep all follow-up visits as told by your health care provider. This is important. You may need more injections. Contact a health care provider if you have:  A fever.  Warmth in your injection area.  Fluid, blood, or pus coming from your injection site.  Symptoms at your injection site that last longer than 2 days after your procedure. Get help right away if:  Your knee: ? Turns very red. ? Becomes very swollen. ? Is in severe pain. Summary  A knee injection is a procedure to get medicine into your knee joint to relieve the pain, swelling, and stiffness of arthritis.  A needle is carefully placed between your kneecap and your knee to inject medicine into the joint space.  Before the procedure, ask your health care provider about changing or stopping your regular medicines, especially if you are taking diabetes medicines or blood thinners.  Contact your health care provider if you have any problems or questions after your procedure. This information is not intended to replace advice given to you by your health care provider. Make sure you discuss any questions you have with your health care provider. Document Revised: 06/15/2017 Document Reviewed: 06/15/2017 Elsevier Patient Education  [place].

## 2019-12-02 DIAGNOSIS — M545 Low back pain, unspecified: Secondary | ICD-10-CM | POA: Insufficient documentation

## 2019-12-02 MED ORDER — CYANOCOBALAMIN 1000 MCG/ML IJ SOLN
1000.0000 ug | Freq: Once | INTRAMUSCULAR | Status: AC
  Administered 2019-12-01: 1000 ug via INTRAMUSCULAR

## 2019-12-02 NOTE — Assessment & Plan Note (Signed)
IM injection was given today in office  Lab Results  Component Value Date   VITAMINB12 400 10/18/2019

## 2019-12-02 NOTE — Assessment & Plan Note (Signed)
Her pain is not midline, but more in the paraspinus muscles and SI joints..  pT referral made

## 2019-12-02 NOTE — Assessment & Plan Note (Addendum)
She is requesting bilateral steroid injections by me . Most recent I/A injections several months ago by orthopedic PA were done via lateral approach and did not relieve pain After obtaining informed consent for  an I/A injection of both knees,  The left  knee was cleaned with betadine and alcohol.  Topical anesthetic was sprayed on medial side of patella and 40 mg Kenalog mixed with 4 ml 1% lidocaine was injected without difficulty into the bursa,  Patient tolerated the procedure without complications or bleeding.  The same procedure was done on the right knee using 40 mg Kenalog mixed with 4 ml 1% lidocaine . Patient was advised to apply ice for 15 minutes every few hours for the first 24 hours and to avoid strenuous activity for up to 7 days

## 2019-12-02 NOTE — Addendum Note (Signed)
Addended by: Nanci Pina on: 12/02/2019 03:41 PM   Modules accepted: Orders

## 2019-12-16 MED ORDER — HYOSCYAMINE SULFATE 0.125 MG SL SUBL: 0.1250 mg | SUBLINGUAL_TABLET | SUBLINGUAL | 5 refills | Status: DC | PRN

## 2020-01-02 DIAGNOSIS — L308 Other specified dermatitis: Secondary | ICD-10-CM | POA: Diagnosis not present

## 2020-01-02 DIAGNOSIS — Z85828 Personal history of other malignant neoplasm of skin: Secondary | ICD-10-CM | POA: Diagnosis not present

## 2020-01-02 DIAGNOSIS — D2271 Melanocytic nevi of right lower limb, including hip: Secondary | ICD-10-CM | POA: Diagnosis not present

## 2020-01-02 DIAGNOSIS — D225 Melanocytic nevi of trunk: Secondary | ICD-10-CM | POA: Diagnosis not present

## 2020-01-02 DIAGNOSIS — L570 Actinic keratosis: Secondary | ICD-10-CM | POA: Diagnosis not present

## 2020-01-02 DIAGNOSIS — D2262 Melanocytic nevi of left upper limb, including shoulder: Secondary | ICD-10-CM | POA: Diagnosis not present

## 2020-01-02 DIAGNOSIS — D485 Neoplasm of uncertain behavior of skin: Secondary | ICD-10-CM | POA: Diagnosis not present

## 2020-01-02 DIAGNOSIS — D2261 Melanocytic nevi of right upper limb, including shoulder: Secondary | ICD-10-CM | POA: Diagnosis not present

## 2020-01-05 DIAGNOSIS — H8109 Meniere's disease, unspecified ear: Secondary | ICD-10-CM | POA: Insufficient documentation

## 2020-01-05 DIAGNOSIS — E119 Type 2 diabetes mellitus without complications: Secondary | ICD-10-CM | POA: Insufficient documentation

## 2020-01-05 DIAGNOSIS — E1142 Type 2 diabetes mellitus with diabetic polyneuropathy: Secondary | ICD-10-CM | POA: Insufficient documentation

## 2020-01-05 DIAGNOSIS — Z124 Encounter for screening for malignant neoplasm of cervix: Secondary | ICD-10-CM | POA: Diagnosis not present

## 2020-01-05 DIAGNOSIS — R87619 Unspecified abnormal cytological findings in specimens from cervix uteri: Secondary | ICD-10-CM | POA: Insufficient documentation

## 2020-01-05 HISTORY — DX: Unspecified abnormal cytological findings in specimens from cervix uteri: R87.619

## 2020-01-17 ENCOUNTER — Other Ambulatory Visit: Payer: Self-pay | Admitting: Internal Medicine

## 2020-01-17 DIAGNOSIS — L570 Actinic keratosis: Secondary | ICD-10-CM | POA: Diagnosis not present

## 2020-02-14 ENCOUNTER — Ambulatory Visit: Payer: Medicare Other | Admitting: Plastic Surgery

## 2020-03-07 ENCOUNTER — Other Ambulatory Visit: Payer: Self-pay | Admitting: Internal Medicine

## 2020-03-07 NOTE — Telephone Encounter (Cosign Needed)
Refill request for xanax, last seen 12-01-19, last filled 08-24-19.  Please advise.

## 2020-03-08 ENCOUNTER — Other Ambulatory Visit: Payer: Self-pay | Admitting: Internal Medicine

## 2020-03-14 ENCOUNTER — Telehealth: Payer: Self-pay | Admitting: Internal Medicine

## 2020-03-14 MED ORDER — LEVOTHYROXINE SODIUM 112 MCG PO TABS: ORAL_TABLET | ORAL | 3 refills | Status: DC

## 2020-03-14 NOTE — Telephone Encounter (Signed)
Patient called in about her refill forlevothyroxine (SYNTHROID) 112 MCG tablet she has 2 more left

## 2020-03-15 MED ORDER — LEVOTHYROXINE SODIUM 112 MCG PO TABS: ORAL_TABLET | ORAL | 3 refills | Status: DC

## 2020-03-15 NOTE — Telephone Encounter (Signed)
Pt needs the Synthroid  to go to [address]  She is out of medication

## 2020-03-15 NOTE — Addendum Note (Signed)
Addended by: Elpidio Galea T on: 03/15/2020 01:08 PM   Modules accepted: Orders

## 2020-05-01 ENCOUNTER — Other Ambulatory Visit: Payer: Self-pay | Admitting: Internal Medicine

## 2020-05-04 ENCOUNTER — Other Ambulatory Visit: Payer: Self-pay | Admitting: Internal Medicine

## 2020-05-08 ENCOUNTER — Emergency Department
Admission: EM | Admit: 2020-05-08 | Discharge: 2020-05-08 | Disposition: A | Discharge status: Home / Self Care | Payer: Medicare Other | Attending: Emergency Medicine | Admitting: Emergency Medicine

## 2020-05-08 ENCOUNTER — Other Ambulatory Visit: Payer: Self-pay

## 2020-05-08 ENCOUNTER — Telehealth: Payer: Self-pay

## 2020-05-08 ENCOUNTER — Emergency Department: Payer: Medicare Other

## 2020-05-08 DIAGNOSIS — W01198A Fall on same level from slipping, tripping and stumbling with subsequent striking against other object, initial encounter: Secondary | ICD-10-CM | POA: Diagnosis not present

## 2020-05-08 DIAGNOSIS — Z79899 Other long term (current) drug therapy: Secondary | ICD-10-CM | POA: Diagnosis not present

## 2020-05-08 DIAGNOSIS — S0083XA Contusion of other part of head, initial encounter: Secondary | ICD-10-CM | POA: Diagnosis not present

## 2020-05-08 DIAGNOSIS — E039 Hypothyroidism, unspecified: Secondary | ICD-10-CM | POA: Insufficient documentation

## 2020-05-08 DIAGNOSIS — S0990XA Unspecified injury of head, initial encounter: Secondary | ICD-10-CM | POA: Insufficient documentation

## 2020-05-08 DIAGNOSIS — E119 Type 2 diabetes mellitus without complications: Secondary | ICD-10-CM | POA: Diagnosis not present

## 2020-05-08 DIAGNOSIS — S7001XA Contusion of right hip, initial encounter: Secondary | ICD-10-CM | POA: Diagnosis not present

## 2020-05-08 DIAGNOSIS — S8001XA Contusion of right knee, initial encounter: Secondary | ICD-10-CM | POA: Diagnosis not present

## 2020-05-08 DIAGNOSIS — S0993XA Unspecified injury of face, initial encounter: Secondary | ICD-10-CM | POA: Diagnosis not present

## 2020-05-08 DIAGNOSIS — R52 Pain, unspecified: Secondary | ICD-10-CM

## 2020-05-08 DIAGNOSIS — M1711 Unilateral primary osteoarthritis, right knee: Secondary | ICD-10-CM | POA: Diagnosis not present

## 2020-05-08 DIAGNOSIS — Z043 Encounter for examination and observation following other accident: Secondary | ICD-10-CM | POA: Diagnosis not present

## 2020-05-08 DIAGNOSIS — W19XXXA Unspecified fall, initial encounter: Secondary | ICD-10-CM

## 2020-05-08 NOTE — ED Triage Notes (Addendum)
Pt comes via POV from home with c/o fall. Pt states she tripped and fell today onto the concrete. Pt states she is not on blood thinner. Pt states pain to head. Pt has blackeye and swelling to right eye and forehead.  Pt also states pain to right knee.

## 2020-05-08 NOTE — ED Provider Notes (Signed)
[facility] Emergency Department Provider Note  ____________________________________________  Time seen: Approximately 5:30 PM  I have reviewed the triage vital signs and the nursing notes.   HISTORY  Chief Complaint Fall    HPI Megan Bradley is a 79 y.o. female who presents the emergency department complaining of a fall resulting in her striking her head, right hip and right knee pain.  Patient states that she was attempting to hang a wreath outdoors when she lost her balance, the wreath was heavy causing her to fall.  Patient states that she fell, striking her head on concrete.  She did not lose consciousness but had immediate bruising and swelling along the forehead.  Patient reports no underlying headache but does have frontal skull pain, ecchymosis, hematoma, orbital ecchymosis around the right eye.  Patient is also complaining of right knee and right hip pain.  She was ambulatory after the incident.  No subsequent loss of consciousness.  No history of previous CVAs.  She is not on blood thinners.  No previous injury to the right knee or right hip but does have a history of osteoarthritis.  Patient has a history of GERD, hypothyroidism, hypertension, right bundle branch block.  No complaints of chronic medical issues.         Past Medical History:  Diagnosis Date  . Anxiety   . Anxiety disorder   . Cervical dysplasia    a. 2010 - high grade squamous intraepithelial lesion s/p excision.  . Chest pain    a. 2006 or 2007 [facility]): reportedly nl cors;  b. 09/2011 ETT: nl.  . GERD (gastroesophageal reflux disease)   . Herpes zoster   . Hypothyroidism   . Orthostatic hypotension   . Pre-diabetes   . RBBB   . Vertigo   . Wears dentures    partial lower    Patient Active Problem List   Diagnosis Date Noted  . Low back pain 12/02/2019  . Breast disorder in female 10/14/2019  . Ruptured left breast implant, initial encounter 07/05/2018  . Ruptured  silicone breast implant 07/05/2018  . Change in bowel habits 07/04/2018  . Bilateral chronic knee pain 04/11/2018  . S/P breast implant, silicone [id number]/[id number]  . Osteoporosis of femur without pathological fracture 03/11/2018  . 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 Tubular adenoma of colon   . Hot flushes, perimenopausal 01/05/2016  . RBBB   . Chest pain   . Orthostatic hypotension   . Shoulder pain, left 04/10/2015  . OSA (obstructive sleep apnea) 11/22/2013  . B12 deficiency 01/11/2013  . Hypovitaminosis D 01/11/2013  . Deviated septum 01/11/2013  . Hyperlipidemia 09/04/2011  . Abnormal cells of cervix 08/27/2011  . Multinodular goiter 08/27/2011  . Iatrogenic hypothyroidism   . Diabetes mellitus without complication ([place])   . GERD (gastroesophageal reflux disease)   . Anxiety with obsessional features     Past Surgical History:  Procedure Laterality Date  . AUGMENTATION MAMMAPLASTY Bilateral   . BREAST EXCISIONAL BIOPSY Left   . CARDIAC CATHETERIZATION  2003   Dr.Callwood, R/L heart cath,  . COLONOSCOPY WITH PROPOFOL N/A 06/12/2017   Procedure: COLONOSCOPY WITH PROPOFOL;  Surgeon: Lucilla Lame, MD;  Location: [facility];  Service: Endoscopy;  Laterality: N/A;  . ESOPHAGEAL MANOMETRY N/A 06/19/2016   Procedure: ESOPHAGEAL MANOMETRY (EM);  Surgeon: Lucilla Lame, MD;  Location: [facility] END[facility]COPY;  Service: Endoscopy;  Laterality: N/A;  . ESOPHAGOGASTRODUODENOSCOPY (EGD) WITH PROPOFOL N/A 04/03/2017   Procedure: ESOPHAGOGASTRODUODENOSCOPY (EGD) WITH PROPOFOL;  Surgeon: Lucilla Lame, MD;  Location: [facility];  Service: Endoscopy;  Laterality: N/A;  diabetic - diet controlled  . THYROIDECTOMY  05/15/15   [facility]    Prior to Admission medications   Medication Sig Start Date End Date Taking? Authorizing Provider  ALPRAZolam Duanne Moron) 0.5 MG tablet Take 1 tablet by mouth twice daily as needed for anxiety 03/08/20   Crecencio Mc, MD  CARTIA XT 120 MG 24 hr capsule TAKE 1  CAPSULE BY MOUTH  DAILY 05/07/20   Crecencio Mc, MD  ezetimibe (ZETIA) 10 MG tablet TAKE 1 TABLET BY MOUTH  DAILY 01/18/20   Crecencio Mc, MD  glucose blood test strip For One touch Verio Flex .  Use to check 3 times daily   e11.9 06/29/15   Crecencio Mc, MD  hyoscyamine (LEVSIN SL) 0.125 MG SL tablet Place 1 tablet (0.125 mg total) under the tongue every 4 (four) hours as needed. 12/16/19   Crecencio Mc, MD  levothyroxine (SYNTHROID) 112 MCG tablet TAKE 1 TABLET BY MOUTH  DAILY BEFORE BREAKFAST 03/15/20   Crecencio Mc, MD  meclizine (ANTIVERT) 25 MG tablet TAKE 1 TABLET BY MOUTH THREE TIMES DAILY AS NEEDED FOR VERTIGO 09/17/18   [provider]  nitroGLYCERIN (NITROSTAT) 0.4 MG SL tablet DISSOLVE ONE TABLET UNDER THE TONGUE EVERY 5 MINUTES AS NEEDED FOR CHEST PAIN.  DO NOT EXCEED A TOTAL OF 3 DOSES IN 15 MINUTES 12/06/18   Crecencio Mc, MD  sertraline (ZOLOFT) 100 MG tablet TAKE 2 TABLETS BY MOUTH  DAILY 05/02/20   Crecencio Mc, MD    Allergies Atorvastatin, Codeine, Fentanyl, Lipitor [atorvastatin calcium], Statins, and Tetracyclines & related  Family History  Problem Relation Age of Onset  . Heart attack Mother   . Alcohol abuse Mother   . Mental illness Mother        died in her 68's of alzheimer's Dementia  . Heart disease Mother 69  . Heart attack Father   . Heart disease Father 78       AMI - died @ 48.  . Colon cancer Paternal Grandmother 47    Social History Social History   Tobacco Use  . Smoking status: Never Smoker  . Smokeless tobacco: Never Used  Vaping Use  . Vaping Use: Never used  Substance Use Topics  . Alcohol use: Yes    Comment: wine once a week  . Drug use: No     Review of Systems  Constitutional: No fever/chills.  Fall striking her head with bruising and ecchymosis of the forehead and eye Eyes: No visual changes. No discharge ENT: No upper respiratory complaints. Cardiovascular: no chest pain. Respiratory: no cough. No  SOB. Gastrointestinal: No abdominal pain.  No nausea, no vomiting.  No diarrhea.  No constipation. Genitourinary: Negative for dysuria. No hematuria Musculoskeletal: Right hip and right knee pain Skin: Negative for rash, abrasions, lacerations, ecchymosis. Neurological: Negative for headaches, focal weakness or numbness.  10 System ROS otherwise negative.  ____________________________________________   PHYSICAL EXAM:  VITAL SIGNS: ED Triage Vitals  Enc Vitals Group     BP 05/08/20 1508 (!) 142/78     Pulse Rate 05/08/20 1508 71     Resp 05/08/20 1508 18     Temp 05/08/20 1508 98.4 F (36.9 C)     Temp src --      SpO2 05/08/20 1508 96 %     Weight 05/08/20 1506 148 lb (67.1 kg)  Height 05/08/20 1506 5' 5.5" (1.664 m)     Head Circumference --      Peak Flow --      Pain Score 05/08/20 1506 4     Pain Loc --      Pain Edu? --      Excl. in [place]? --      Constitutional: Alert and oriented. Well appearing and in no acute distress. Eyes: Conjunctivae are normal. PERRL. EOMI. Head: Atraumatic. ENT:      Ears:       Nose: No congestion/rhinnorhea.      Mouth/Throat: Mucous membranes are moist.  Neck: No stridor.  No cervical spine tenderness to palpation.  Cardiovascular: Normal rate, regular rhythm. Normal S1 and S2.  Good peripheral circulation. Respiratory: Normal respiratory effort without tachypnea or retractions. Lungs CTAB. Good air entry to the bases with no decreased or absent breath sounds. Musculoskeletal: Full range of motion to all extremities. No gross deformities appreciated.  Visualization of the right knee and right hip reveal no visible signs of trauma with abrasions, lacerations, ecchymosis or edema.  Patient is tender to palpation over the lateral aspect of the hip over the greater trochanter region.  No palpable abnormality.  No shortening or rotation of the leg.  No tenderness over the femur but patient does have tenderness over the anterior knee,  specifically the patella.  No ballottement.  Good range of motion.  Varus, valgus, Lachman's is negative.  Dorsalis pedis pulses sensation intact distally. Neurologic:  Normal speech and language. No gross focal neurologic deficits are appreciated.  Cranial nerves II through XII grossly intact. Skin:  Skin is warm, dry and intact. No rash noted. Psychiatric: Mood and affect are normal. Speech and behavior are normal. Patient exhibits appropriate insight and judgement.   ____________________________________________   LABS (all labs ordered are listed, but only abnormal results are displayed)  Labs Reviewed - No data to display ____________________________________________  EKG   ____________________________________________  RADIOLOGY I personally viewed and evaluated these images as part of my medical decision making, as well as reviewing the written report by the radiologist.  ED Provider Interpretation: No acute traumatic findings on CT head, cervical spine, max face or femur.  CT Head Wo Contrast  Result Date: 05/08/2020 CLINICAL DATA:  Fall, facial trauma EXAM: CT HEAD WITHOUT CONTRAST TECHNIQUE: Contiguous axial images were obtained from the base of the skull through the vertex without intravenous contrast. COMPARISON:  2009 FINDINGS: Brain: There is no acute intracranial hemorrhage, mass effect, or edema. Gray-white differentiation is preserved. There is no extra-axial fluid collection. Prominence of the ventricles and sulci reflects minor generalized parenchymal volume loss. Vascular: There is atherosclerotic calcification at the skull base. Skull: Calvarium is unremarkable. Sinuses/Orbits: Dictated separately. Other: None. IMPRESSION: No evidence acute intracranial injury. Electronically Signed   By: Macy Mis M.D.   On: 05/08/2020 16:13   CT Cervical Spine Wo Contrast  Result Date: 05/08/2020 CLINICAL DATA:  Fall, facial trauma EXAM: CT CERVICAL SPINE WITHOUT CONTRAST  TECHNIQUE: Multidetector CT imaging of the cervical spine was performed without intravenous contrast. Multiplanar CT image reconstructions were also generated. COMPARISON:  None. FINDINGS: Alignment: Trace anterolisthesis at C4-C5. Skull base and vertebrae: No acute cervical spine fracture. Vertebral body heights are maintained. Soft tissues and spinal canal: No prevertebral fluid or swelling. No visible canal hematoma. Disc levels: Multilevel degenerative changes are present including disc space narrowing, endplate osteophytes, and facet and uncovertebral hypertrophy. No high-grade stenosis. Upper chest: No apical lung  mass. Other: None. IMPRESSION: No acute cervical spine fracture. Electronically Signed   By: Macy Mis M.D.   On: 05/08/2020 16:22   DG FEMUR, MIN 2 VIEWS RIGHT  Result Date: 05/08/2020 CLINICAL DATA:  Right hip and knee pain after fall today EXAM: RIGHT FEMUR 2 VIEWS COMPARISON:  12/30/2017 FINDINGS: Frontal and lateral views of the right femur demonstrate no acute displaced fractures. There is moderate 3 compartmental osteoarthritis within the right knee. Right hip is unremarkable. Soft tissues are normal. IMPRESSION: 1. No acute displaced fracture. 2. Moderate 3 compartmental right knee osteoarthritis. Electronically Signed   By: Randa Ngo M.D.   On: 05/08/2020 18:40   CT Maxillofacial Wo Contrast  Result Date: 05/08/2020 CLINICAL DATA:  Fall, facial trauma EXAM: CT MAXILLOFACIAL WITHOUT CONTRAST TECHNIQUE: Multidetector CT imaging of the maxillofacial structures was performed. Multiplanar CT image reconstructions were also generated. COMPARISON:  None. FINDINGS: Osseous: No acute facial fracture. Degenerative changes at temporomandibular joints. Orbits: No intraorbital hematoma. Sinuses: Minor mucosal thickening. Soft tissues: Right periorbital and frontal scalp soft tissue swelling/hematoma. Limited intracranial: Dictated separately. IMPRESSION: No acute facial fracture.  Electronically Signed   By: Macy Mis M.D.   On: 05/08/2020 16:18    ____________________________________________    PROCEDURES  Procedure(s) performed:    Procedures    Medications - No data to display   ____________________________________________   INITIAL IMPRESSION / ASSESSMENT AND PLAN / ED COURSE  Pertinent labs & imaging results that were available during my care of the patient were reviewed by me and considered in my medical decision making (see chart for details).  Review of the [place] CSRS was performed in accordance of the [place] prior to dispensing any controlled drugs.           Patient's diagnosis is consistent with fall resulting in minor head injury, contusion of the face, knee and hip.  Patient sustained a mechanical fall at home while attempting to put up Christmas decorations.  Patient fell striking her face.  She had a large amount of ecchymosis to the frontal area and periorbital region.  Imaging revealed no acute traumatic findings.  Patient was neurologically intact.  She is also endorsing knee and hip pain with a history of osteoarthritis.  Given the fall I image the area to ensure no fractures.  Imaging is reassuring.  Patient is stable at this time.  Follow-up with orthopedics as I feel that patient is likely trending towards a knee replacement as she has had ongoing issues with the right knee without an orthopedic surgeon.  Otherwise follow-up primary care as needed.. Patient is given ED precautions to return to the ED for any worsening or new symptoms.     ____________________________________________  FINAL CLINICAL IMPRESSION(S) / ED DIAGNOSES  Final diagnoses:  Fall, initial encounter  Minor head injury, initial encounter  Contusion of face, initial encounter  Contusion of right knee, initial encounter  Contusion of right hip, initial encounter      NEW MEDICATIONS STARTED DURING THIS VISIT:  ED Discharge Orders    None           This chart was dictated using voice recognition software/Dragon. Despite best efforts to proofread, errors can occur which can change the meaning. Any change was purely unintentional.    Darletta Moll, PA-C 05/08/20 2022    Nance Pear, MD 05/08/20 2032

## 2020-05-08 NOTE — Telephone Encounter (Signed)
Patient fell on side walk hit her head to the point it is bleeding patient felt dazed and confused she was sent to [facility] due to no appt in office [facility] advised she needed to be seen in the ER. Patient is currently in the ER I have scheduled an ER follow up for Friday at 8 virtual appt.

## 2020-05-08 NOTE — Telephone Encounter (Signed)
Patient called to let Dr Derrel Nip know that she fell and hit her head on concrete and has an abrasion above her eye and a big knot on her head. No slurring of speech and very little pain beside where she initially hit. Minimal bleeding, she is developing bruising very much, and she lost her balance due to the environment. Patient did not lose consciousness and not sensitive to light. She is currently on her way to [place].

## 2020-05-08 NOTE — Telephone Encounter (Signed)
Pt called and would like a call back about message below  She said that she went to [place] and they told her that they can't do imaging and that she is now on the way to ED   Pt is upset that she couldn't be worked in with Dr. Derrel Nip

## 2020-05-08 NOTE — ED Notes (Signed)
Pt reports trying to hang a wreath that then hit her on her head and pt subsequently fell. Pt has c/o HA and right knee soreness. Pt has swelling and ecchymosis to right orbital area with small lacerations. No active bleeding at this time. Negative LOC. Pt does not take blood thinners. Pt denies C-Spine tenderness. Pt ambulatory on arrival and A&O x 4.

## 2020-05-11 ENCOUNTER — Encounter: Payer: Self-pay | Admitting: Internal Medicine

## 2020-05-11 ENCOUNTER — Telehealth (INDEPENDENT_AMBULATORY_CARE_PROVIDER_SITE_OTHER): Payer: Medicare Other | Admitting: Internal Medicine

## 2020-05-11 VITALS — Ht 65.5 in | Wt 148.0 lb

## 2020-05-11 DIAGNOSIS — E559 Vitamin D deficiency, unspecified: Secondary | ICD-10-CM | POA: Diagnosis not present

## 2020-05-11 DIAGNOSIS — Z9181 History of falling: Secondary | ICD-10-CM | POA: Diagnosis not present

## 2020-05-11 DIAGNOSIS — R5383 Other fatigue: Secondary | ICD-10-CM | POA: Diagnosis not present

## 2020-05-11 DIAGNOSIS — M1711 Unilateral primary osteoarthritis, right knee: Secondary | ICD-10-CM

## 2020-05-11 DIAGNOSIS — E7849 Other hyperlipidemia: Secondary | ICD-10-CM

## 2020-05-11 DIAGNOSIS — E119 Type 2 diabetes mellitus without complications: Secondary | ICD-10-CM

## 2020-05-11 DIAGNOSIS — R2689 Other abnormalities of gait and mobility: Secondary | ICD-10-CM | POA: Diagnosis not present

## 2020-05-11 DIAGNOSIS — E538 Deficiency of other specified B group vitamins: Secondary | ICD-10-CM

## 2020-05-11 DIAGNOSIS — E032 Hypothyroidism due to medicaments and other exogenous substances: Secondary | ICD-10-CM

## 2020-05-11 NOTE — Patient Instructions (Signed)
TYLENOL 4000 MG DAILY FOR THE NEXT 5 DAYS  MOTRIN 600 MG TWICE DAILY NOT MORE  REDUCE TYLENOL TO 2000 MG DAILY AFTER 5 DAYS   BRAIN REST FOR THE NEXT WEEK:  NO READING,  NO EXERCISE EXCEPT WALKING  REFERRALS TO ORHTOPEDICS AND PT

## 2020-05-11 NOTE — Progress Notes (Signed)
Virtual Visit via [place]  This visit type was conducted due to national recommendations for restrictions regarding the COVID-19 pandemic (e.g. social distancing).  This format is felt to be most appropriate for this patient at this time.  All issues noted in this document were discussed and addressed.  No physical exam was performed (except for noted visual exam findings with Video Visits).   I connected with@ on 05/12/20 at  8:00 AM EST by a video enabled telemedicine application  and verified that I am speaking with the correct person using two identifiers. Location patient: home Location provider: work or home office Persons participating in the virtual visit: patient, provider  I discussed the limitations, risks, security and privacy concerns of performing an evaluation and management service by telephone and the availability of in person appointments. I also discussed with the patient that there may be a patient responsible charge related to this service. The patient expressed understanding and agreed to proceed.   Reason for visit:  ER follow up  HPI:  79 yr old with history of frequent falls due to balance issues  Was treated In ER yesterday after falling off a short step ladder while trying to hang a wreath on her door.   She hit her head on the pavement , broker her glasses and sustained a laceration on her forehead, that bled profusely.  Her hus  1) pain from injuries:  Using tylenol.  No headaches or vision change s  2) fatigue:  Chronic.  Not exercising regularly.   3) bilateral ankle edema:   left does not resolve after sleeping .   History of sprains    ROS: See pertinent positives and negatives per HPI.  Past Medical History:  Diagnosis Date  . Anxiety   . Anxiety disorder   . Cervical dysplasia    a. 2010 - high grade squamous intraepithelial lesion s/p excision.  . Chest pain    a. 2006 or 2007 [facility] [place]): reportedly nl cors;  b. 09/2011 [facility]: nl.  . GERD  (gastroesophageal reflux disease)   . Herpes zoster   . Hypothyroidism   . Orthostatic hypotension   . Pre-diabetes   . RBBB   . Vertigo   . Wears dentures    partial lower    Past Surgical History:  Procedure Laterality Date  . AUGMENTATION MAMMAPLASTY Bilateral   . BREAST EXCISIONAL BIOPSY Left   . CARDIAC CATHETERIZATION  2003   Dr.Callwood, R/L heart cath,  . COLONOSCOPY WITH PROPOFOL N/A 06/12/2017   Procedure: COLONOSCOPY WITH PROPOFOL;  Surgeon: Lucilla Lame, MD;  Location: [place];  Service: Endoscopy;  Laterality: N/A;  . ESOPHAGEAL MANOMETRY N/A 06/19/2016   Procedure: ESOPHAGEAL MANOMETRY (EM);  Surgeon: Lucilla Lame, MD;  Location: [facility];  Service: Endoscopy;  Laterality: N/A;  . ESOPHAGOGASTRODUODENOSCOPY (EGD) WITH PROPOFOL N/A 04/03/2017   Procedure: ESOPHAGOGASTRODUODENOSCOPY (EGD) WITH PROPOFOL;  Surgeon: Lucilla Lame, MD;  Location: [place];  Service: Endoscopy;  Laterality: N/A;  diabetic - diet controlled  . THYROIDECTOMY  05/15/15   [facility]    Family History  Problem Relation Age of Onset  . Heart attack Mother   . Alcohol abuse Mother   . Mental illness Mother        died in her 47's of alzheimer's Dementia  . Heart disease Mother 24  . Heart attack Father   . Heart disease Father 94       AMI - died @ 30.  . Colon cancer Paternal  Grandmother 72    SOCIAL HX:  reports that she has never smoked. She has never used smokeless tobacco. She reports current alcohol use. She reports that she does not use drugs.   Current Outpatient Medications:  .  ALPRAZolam (XANAX) 0.5 MG tablet, Take 1 tablet by mouth twice daily as needed for anxiety, Disp: 60 tablet, Rfl: 2 .  CARTIA XT 120 MG 24 hr capsule, TAKE 1 CAPSULE BY MOUTH  DAILY, Disp: 90 capsule, Rfl: 3 .  ezetimibe (ZETIA) 10 MG tablet, TAKE 1 TABLET BY MOUTH  DAILY, Disp: 90 tablet, Rfl: 3 .  glucose blood test strip, For One touch Verio Flex .  Use to check 3 times daily    e11.9, Disp: 100 each, Rfl: 0 .  hyoscyamine (LEVSIN SL) 0.125 MG SL tablet, Place 1 tablet (0.125 mg total) under the tongue every 4 (four) hours as needed., Disp: 30 tablet, Rfl: 5 .  levothyroxine (SYNTHROID) 112 MCG tablet, TAKE 1 TABLET BY MOUTH  DAILY BEFORE BREAKFAST, Disp: 90 tablet, Rfl: 3 .  meclizine (ANTIVERT) 25 MG tablet, TAKE 1 TABLET BY MOUTH THREE TIMES DAILY AS NEEDED FOR VERTIGO, Disp: , Rfl:  .  nitroGLYCERIN (NITROSTAT) 0.4 MG SL tablet, DISSOLVE ONE TABLET UNDER THE TONGUE EVERY 5 MINUTES AS NEEDED FOR CHEST PAIN.  DO NOT EXCEED A TOTAL OF 3 DOSES IN 15 MINUTES, Disp: 20 tablet, Rfl: 3 .  omeprazole (PRILOSEC) 40 MG capsule, , Disp: , Rfl:  .  sertraline (ZOLOFT) 100 MG tablet, TAKE 2 TABLETS BY MOUTH  DAILY, Disp: 180 tablet, Rfl: 3  EXAM:  VITALS per patient if applicable:  GENERAL: alert, oriented, appears well and in no acute distress  HEENT: right orbital area with ecchymosis  conjunctiva clear, no obvious abnormalities on inspection of external nose and ears  NECK: normal movements of the head and neck  LUNGS: on inspection no signs of respiratory distress, breathing rate appears normal, no obvious gross SOB, gasping or wheezing  CV: no obvious cyanosis  MS: moves all visible extremities without noticeable abnormality  PSYCH/NEURO: pleasant and cooperative, no obvious depression or anxiety, speech and thought processing grossly intact  ASSESSMENT AND PLAN:  Discussed the following assessment and plan:  Primary osteoarthritis of right knee - Plan: Ambulatory referral to Orthopedic Surgery  History of recent fall - Plan: Ambulatory referral to Physical Therapy  Balance problem - Plan: Ambulatory referral to Physical Therapy  Fatigue, unspecified type - Plan: CBC with Differential/Platelet, Iron, TIBC and Ferritin Panel, Comprehensive metabolic panel  Hypovitaminosis D  Other hyperlipidemia - Plan: Lipid panel  Iatrogenic hypothyroidism - Plan:  Thyroid Panel With TSH  B12 deficiency - Plan: Vitamin B12  Diabetes mellitus without complication (HCC) - Plan: Hemoglobin A1c  History of recent fall Her most recent fall occurred when she lost her balance trying to hang a wreath on her front door.  She fell forward and sustained a laceration to her forehead.  CT head,  Face, and spine done and results reviewed with patient.  PT referral in progress  Balance problem Chronic, Multifactorial.  PT referral in progress to strengthen core muscles   Fatigue after COVID-19 vaccination Likely multifactorial.  Screening labs ordered. She has no symptoms of cardiomyopathy     I discussed the assessment and treatment plan with the patient. The patient was provided an opportunity to ask questions and all were answered. The patient agreed with the plan and demonstrated an understanding of the instructions.   The patient  was advised to call back or seek an in-person evaluation if the symptoms worsen or if the condition fails to improve as anticipated.  I provided  30 minutes of non-face-to-face time during this encounter.   Crecencio Mc, MD

## 2020-05-12 DIAGNOSIS — Z9181 History of falling: Secondary | ICD-10-CM | POA: Insufficient documentation

## 2020-05-12 DIAGNOSIS — R2689 Other abnormalities of gait and mobility: Secondary | ICD-10-CM | POA: Insufficient documentation

## 2020-05-12 NOTE — Assessment & Plan Note (Signed)
Her most recent fall occurred when she lost her balance trying to hang a wreath on her front door.  She fell forward and sustained a laceration to her forehead.  CT head,  Face, and spine done and results reviewed with patient.  PT referral in progress

## 2020-05-12 NOTE — Assessment & Plan Note (Signed)
Likely multifactorial.  Screening labs ordered. She has no symptoms of cardiomyopathy

## 2020-05-12 NOTE — Assessment & Plan Note (Addendum)
Chronic, Multifactorial.  PT referral in progress to strengthen core muscles

## 2020-05-14 ENCOUNTER — Other Ambulatory Visit: Payer: Self-pay

## 2020-05-14 ENCOUNTER — Other Ambulatory Visit (INDEPENDENT_AMBULATORY_CARE_PROVIDER_SITE_OTHER): Payer: Medicare Other

## 2020-05-14 ENCOUNTER — Other Ambulatory Visit: Payer: Self-pay | Admitting: Internal Medicine

## 2020-05-14 DIAGNOSIS — R5383 Other fatigue: Secondary | ICD-10-CM

## 2020-05-14 DIAGNOSIS — E032 Hypothyroidism due to medicaments and other exogenous substances: Secondary | ICD-10-CM | POA: Diagnosis not present

## 2020-05-14 DIAGNOSIS — E119 Type 2 diabetes mellitus without complications: Secondary | ICD-10-CM

## 2020-05-14 DIAGNOSIS — E538 Deficiency of other specified B group vitamins: Secondary | ICD-10-CM

## 2020-05-14 DIAGNOSIS — E7849 Other hyperlipidemia: Secondary | ICD-10-CM | POA: Diagnosis not present

## 2020-05-14 LAB — CBC WITH DIFFERENTIAL/PLATELET
Basophils Absolute: 0.1 10*3/uL (ref 0.0–0.1)
Basophils Relative: 1 % (ref 0.0–3.0)
Eosinophils Absolute: 0.1 10*3/uL (ref 0.0–0.7)
Eosinophils Relative: 2.5 % (ref 0.0–5.0)
HCT: 37.8 % (ref 36.0–46.0)
Hemoglobin: 12.5 g/dL (ref 12.0–15.0)
Lymphocytes Relative: 22.9 % (ref 12.0–46.0)
Lymphs Abs: 1.3 10*3/uL (ref 0.7–4.0)
MCHC: 33.1 g/dL (ref 30.0–36.0)
MCV: 92.1 fl (ref 78.0–100.0)
Monocytes Absolute: 0.4 10*3/uL (ref 0.1–1.0)
Monocytes Relative: 6.7 % (ref 3.0–12.0)
Neutro Abs: 3.8 10*3/uL (ref 1.4–7.7)
Neutrophils Relative %: 66.9 % (ref 43.0–77.0)
Platelets: 167 10*3/uL (ref 150.0–400.0)
RBC: 4.1 Mil/uL (ref 3.87–5.11)
RDW: 16.2 % — ABNORMAL HIGH (ref 11.5–15.5)
WBC: 5.7 10*3/uL (ref 4.0–10.5)

## 2020-05-14 LAB — COMPREHENSIVE METABOLIC PANEL
ALT: 22 U/L (ref 0–35)
AST: 21 U/L (ref 0–37)
Albumin: 3.9 g/dL (ref 3.5–5.2)
Alkaline Phosphatase: 69 U/L (ref 39–117)
BUN: 14 mg/dL (ref 6–23)
CO2: 30 mEq/L (ref 19–32)
Calcium: 9.4 mg/dL (ref 8.4–10.5)
Chloride: 105 mEq/L (ref 96–112)
Creatinine, Ser: 1.09 mg/dL (ref 0.40–1.20)
GFR: 48.89 mL/min — ABNORMAL LOW (ref >60.00)
Glucose, Bld: 116 mg/dL — ABNORMAL HIGH (ref 70–99)
Potassium: 4 mEq/L (ref 3.5–5.1)
Sodium: 143 mEq/L (ref 135–145)
Total Bilirubin: 0.5 mg/dL (ref 0.2–1.2)
Total Protein: 6.8 g/dL (ref 6.0–8.3)

## 2020-05-14 LAB — LIPID PANEL
Cholesterol: 191 mg/dL (ref 0–200)
HDL: 73.7 mg/dL (ref >39.00)
LDL Cholesterol: 106 mg/dL — ABNORMAL HIGH (ref 0–99)
NonHDL: 116.89
Total CHOL/HDL Ratio: 3
Triglycerides: 56 mg/dL (ref 0.0–149.0)
VLDL: 11.2 mg/dL (ref 0.0–40.0)

## 2020-05-14 LAB — VITAMIN B12: Vitamin B-12: 368 pg/mL (ref 211–911)

## 2020-05-14 LAB — HEMOGLOBIN A1C: Hgb A1c MFr Bld: 6.8 % — ABNORMAL HIGH (ref 4.6–6.5)

## 2020-05-15 LAB — IRON,TIBC AND FERRITIN PANEL
%SAT: 28 % (calc) (ref 16–45)
Ferritin: 200 ng/mL (ref 16–288)
Iron: 89 ug/dL (ref 45–160)
TIBC: 322 mcg/dL (calc) (ref 250–450)

## 2020-05-15 LAB — THYROID PANEL WITH TSH
Free Thyroxine Index: 2.4 (ref 1.4–3.8)
T3 Uptake: 31 % (ref 22–35)
T4, Total: 7.6 ug/dL (ref 5.1–11.9)
TSH: 3.67 mIU/L (ref 0.40–4.50)

## 2020-05-15 NOTE — Progress Notes (Signed)
Your Labs including thyroid are all normal.  Nothing to suggest a cause for your profound fatigue,  and Aqc is at goal of < 7.0   Fatigue is much more common in the winter months for various reasons including diet,  decreased activity,  And sometimes can be due to decreased sunlight leading to seasonal affective disorder , which causes depression .  After you finish PT I recommend  starting a walking program, i would recommend doing it in the late morning or early afternoon during the winter months to boost your sunlight exposure .  Regards,   Deborra Medina, MD

## 2020-05-16 ENCOUNTER — Telehealth: Payer: Self-pay | Admitting: Internal Medicine

## 2020-05-16 NOTE — Telephone Encounter (Signed)
Rejection Reason - Patient did not respond" [facility] [place] said on May 16, 2020 8:46 AM  "Thank you for this referral. We have contacted this patient to schedule and she stated she would call us back next week to schedule. We will keep you updated." [facility] [place] said on May 11, 2020 3:00 PM

## 2020-05-18 ENCOUNTER — Ambulatory Visit (INDEPENDENT_AMBULATORY_CARE_PROVIDER_SITE_OTHER): Payer: Medicare Other | Admitting: Internal Medicine

## 2020-05-18 ENCOUNTER — Encounter: Payer: Self-pay | Admitting: Internal Medicine

## 2020-05-18 ENCOUNTER — Other Ambulatory Visit: Payer: Self-pay

## 2020-05-18 VITALS — BP 124/68 | HR 81 | Temp 98.4°F | Ht 65.98 in | Wt 151.6 lb

## 2020-05-18 DIAGNOSIS — L03115 Cellulitis of right lower limb: Secondary | ICD-10-CM | POA: Diagnosis not present

## 2020-05-18 DIAGNOSIS — M25562 Pain in left knee: Secondary | ICD-10-CM

## 2020-05-18 DIAGNOSIS — M25561 Pain in right knee: Secondary | ICD-10-CM

## 2020-05-18 DIAGNOSIS — G8929 Other chronic pain: Secondary | ICD-10-CM

## 2020-05-18 DIAGNOSIS — R0600 Dyspnea, unspecified: Secondary | ICD-10-CM

## 2020-05-18 DIAGNOSIS — R2689 Other abnormalities of gait and mobility: Secondary | ICD-10-CM | POA: Diagnosis not present

## 2020-05-18 DIAGNOSIS — E119 Type 2 diabetes mellitus without complications: Secondary | ICD-10-CM | POA: Diagnosis not present

## 2020-05-18 DIAGNOSIS — R0609 Other forms of dyspnea: Secondary | ICD-10-CM

## 2020-05-18 DIAGNOSIS — G4733 Obstructive sleep apnea (adult) (pediatric): Secondary | ICD-10-CM

## 2020-05-18 MED ORDER — LIDOCAINE HCL 1 % IJ SOLN: 5.0000 mL | Freq: Once | INTRAMUSCULAR | Status: DC

## 2020-05-18 MED ORDER — CEPHALEXIN 500 MG PO CAPS: 500.0000 mg | ORAL_CAPSULE | Freq: Four times a day (QID) | ORAL | 0 refills | Status: DC

## 2020-05-18 MED ORDER — TRIAMCINOLONE ACETONIDE 40 MG/ML IJ SUSP
40.0000 mg | Freq: Once | INTRAMUSCULAR | Status: AC
  Administered 2020-05-18: 40 mg via INTRA_ARTICULAR

## 2020-05-18 MED ORDER — ROSUVASTATIN CALCIUM 20 MG PO TABS: 20.0000 mg | ORAL_TABLET | Freq: Every day | ORAL | 0 refills | Status: DC

## 2020-05-18 NOTE — Progress Notes (Addendum)
Subjective:  Patient ID: Megan Bradley, female    DOB: 19-Mar-1941  Age: 79 y.o. MRN: [id number]  CC: The primary encounter diagnosis was Right anterior knee pain. Diagnoses of Cellulitis of right lower extremity, Diabetes mellitus without complication ([place]), OSA (obstructive sleep apnea), Cellulitis of right anterior lower leg, Balance problem, Bilateral chronic knee pain, and Exertional dyspnea were also pertinent to this visit.  HPI Megan Bradley presents for follow up on ER visit for fall with blunt trauma to face,  knee4  1)  Right Knee OA.  Reviewed x rays showing moderate 3 compartmental disease. She is deferring knee replacement for now and requesting an I/A injection for relief of pain.    2)  She has noted shortness of breath just walking to her mail box at the end of her street, bending over,  And walking up one flight of stairs. Denies chest pain, jaw pain and claudication symptoms.   3) aortic atherosclerosis noted on 2017 CT reviewed with patient  Today  4) Recent fall.  She has set up her first appt with Physical therapy to work on her balance   Outpatient Medications Prior to Visit  Medication Sig Dispense Refill   ALPRAZolam (XANAX) 0.5 MG tablet Take 1 tablet by mouth twice daily as needed for anxiety 60 tablet 2   CARTIA XT 120 MG 24 hr capsule TAKE 1 CAPSULE BY MOUTH  DAILY 90 capsule 3   ezetimibe (ZETIA) 10 MG tablet TAKE 1 TABLET BY MOUTH  DAILY 90 tablet 3   glucose blood test strip For One touch Verio Flex .  Use to check 3 times daily   e11.9 100 each 0   hyoscyamine (LEVSIN SL) 0.125 MG SL tablet Place 1 tablet (0.125 mg total) under the tongue every 4 (four) hours as needed. 30 tablet 5   levothyroxine (SYNTHROID) 112 MCG tablet TAKE 1 TABLET BY MOUTH  DAILY BEFORE BREAKFAST 90 tablet 3   meclizine (ANTIVERT) 25 MG tablet TAKE 1 TABLET BY MOUTH THREE TIMES DAILY AS NEEDED FOR VERTIGO     nitroGLYCERIN (NITROSTAT) 0.4 MG SL tablet DISSOLVE ONE TABLET UNDER  THE TONGUE EVERY 5 MINUTES AS NEEDED FOR CHEST PAIN.  DO NOT EXCEED A TOTAL OF 3 DOSES IN 15 MINUTES 20 tablet 3   omeprazole (PRILOSEC) 40 MG capsule      sertraline (ZOLOFT) 100 MG tablet TAKE 2 TABLETS BY MOUTH  DAILY 180 tablet 3   No facility-administered medications prior to visit.    Review of Systems;  Patient denies headache, fevers, malaise, unintentional weight loss, skin rash, eye pain, sinus congestion and sinus pain, sore throat, dysphagia,  hemoptysis , cough,  wheezing, chest pain, palpitations, orthopnea, edema, abdominal pain, nausea, melena, diarrhea, constipation, flank pain, dysuria, hematuria, urinary  Frequency, nocturia, numbness, tingling, seizures,  Focal weakness, Loss of consciousness,  Tremor, insomnia, depression, anxiety, and suicidal ideation.      Objective:  BP 124/68    Pulse 81    Temp 98.4 F (36.9 C)    Ht 5' 5.98" (1.676 m)    Wt 151 lb 9.6 oz (68.8 kg)    SpO2 96%    BMI 24.48 kg/m   BP Readings from Last 3 Encounters:  05/18/20 124/68  05/08/20 (!) 157/77  12/01/19 122/76    Wt Readings from Last 3 Encounters:  05/18/20 151 lb 9.6 oz (68.8 kg)  05/11/20 148 lb (67.1 kg)  05/08/20 148 lb (67.1 kg)    General  appearance: alert, cooperative and appears stated age Face: resolving ecchymosis right zygomatic arch Ears: normal TM's and external ear canals both ears Throat: lips, mucosa, and tongue normal; teeth and gums normal Neck: no adenopathy, no carotid bruit, supple, symmetrical, trachea midline and thyroid not enlarged, symmetric, no tenderness/mass/nodules Back: symmetric, no curvature. ROM normal. No CVA tenderness. Lungs: clear to auscultation bilaterally Heart: regular rate and rhythm, S1, S2 normal, no murmur, click, rub or gallop Abdomen: soft, non-tender; bowel sounds normal; no masses,  no organomegaly Ext: right knee without effusion, erythema  Pulses: 2+ and symmetric Skin:  RLE  diffusely covered with scabs surrounded by  erythema consistent with cellulitis .  Lymph nodes: Cervical, supraclavicular, and axillary nodes normal.  Lab Results  Component Value Date   HGBA1C 6.8 (H) 05/14/2020   HGBA1C 6.7 (H) 10/18/2019   HGBA1C 7.1 (H) 03/11/2019    Lab Results  Component Value Date   CREATININE 1.09 05/14/2020   CREATININE 1.02 10/18/2019   CREATININE 0.95 03/11/2019    Lab Results  Component Value Date   WBC 5.7 05/14/2020   HGB 12.5 05/14/2020   HCT 37.8 05/14/2020   PLT 167.0 05/14/2020   GLUCOSE 116 (H) 05/14/2020   CHOL 191 05/14/2020   TRIG 56.0 05/14/2020   HDL 73.70 05/14/2020   LDLDIRECT 93.0 01/02/2016   LDLCALC 106 (H) 05/14/2020   ALT 22 05/14/2020   AST 21 05/14/2020   NA 143 05/14/2020   K 4.0 05/14/2020   CL 105 05/14/2020   CREATININE 1.09 05/14/2020   BUN 14 05/14/2020   CO2 30 05/14/2020   TSH 3.67 05/14/2020   INR 0.9 12/05/2011   HGBA1C 6.8 (H) 05/14/2020   MICROALBUR <0.7 03/11/2019    CT Head Wo Contrast  Result Date: 05/08/2020 CLINICAL DATA:  Fall, facial trauma EXAM: CT HEAD WITHOUT CONTRAST TECHNIQUE: Contiguous axial images were obtained from the base of the skull through the vertex without intravenous contrast. COMPARISON:  2009 FINDINGS: Brain: There is no acute intracranial hemorrhage, mass effect, or edema. Gray-white differentiation is preserved. There is no extra-axial fluid collection. Prominence of the ventricles and sulci reflects minor generalized parenchymal volume loss. Vascular: There is atherosclerotic calcification at the skull base. Skull: Calvarium is unremarkable. Sinuses/Orbits: Dictated separately. Other: None. IMPRESSION: No evidence acute intracranial injury. Electronically Signed   By: Macy Mis M.D.   On: 05/08/2020 16:13   CT Cervical Spine Wo Contrast  Result Date: 05/08/2020 CLINICAL DATA:  Fall, facial trauma EXAM: CT CERVICAL SPINE WITHOUT CONTRAST TECHNIQUE: Multidetector CT imaging of the cervical spine was performed without  intravenous contrast. Multiplanar CT image reconstructions were also generated. COMPARISON:  None. FINDINGS: Alignment: Trace anterolisthesis at C4-C5. Skull base and vertebrae: No acute cervical spine fracture. Vertebral body heights are maintained. Soft tissues and spinal canal: No prevertebral fluid or swelling. No visible canal hematoma. Disc levels: Multilevel degenerative changes are present including disc space narrowing, endplate osteophytes, and facet and uncovertebral hypertrophy. No high-grade stenosis. Upper chest: No apical lung mass. Other: None. IMPRESSION: No acute cervical spine fracture. Electronically Signed   By: Macy Mis M.D.   On: 05/08/2020 16:22   DG FEMUR, MIN 2 VIEWS RIGHT  Result Date: 05/08/2020 CLINICAL DATA:  Right hip and knee pain after fall today EXAM: RIGHT FEMUR 2 VIEWS COMPARISON:  12/30/2017 FINDINGS: Frontal and lateral views of the right femur demonstrate no acute displaced fractures. There is moderate 3 compartmental osteoarthritis within the right knee. Right hip is unremarkable. Soft  tissues are normal. IMPRESSION: 1. No acute displaced fracture. 2. Moderate 3 compartmental right knee osteoarthritis. Electronically Signed   By: Randa Ngo M.D.   On: 05/08/2020 18:40   CT Maxillofacial Wo Contrast  Result Date: 05/08/2020 CLINICAL DATA:  Fall, facial trauma EXAM: CT MAXILLOFACIAL WITHOUT CONTRAST TECHNIQUE: Multidetector CT imaging of the maxillofacial structures was performed. Multiplanar CT image reconstructions were also generated. COMPARISON:  None. FINDINGS: Osseous: No acute facial fracture. Degenerative changes at temporomandibular joints. Orbits: No intraorbital hematoma. Sinuses: Minor mucosal thickening. Soft tissues: Right periorbital and frontal scalp soft tissue swelling/hematoma. Limited intracranial: Dictated separately. IMPRESSION: No acute facial fracture. Electronically Signed   By: Macy Mis M.D.   On: 05/08/2020 16:18     Assessment & Plan:   Problem List Items Addressed This Visit      Unprioritized   Diabetes mellitus without complication ([place])    She remains  well-controlled on low GI diet.  She  has no microalbuminuria. Patient is tolerating statin therapy for CAD risk reduction  .  Lab Results  Component Value Date   HGBA1C 6.8 (H) 05/14/2020   Lab Results  Component Value Date   MICROALBUR <0.7 03/11/2019         Relevant Medications   rosuvastatin (CRESTOR) 20 MG tablet   Other Relevant Orders   Microalbumin / creatinine urine ratio   OSA (obstructive sleep apnea)    No treated due to tracheal deviation resulting in mask intolerance.       Bilateral chronic knee pain    Secondary to moderate OA .   Patient has requested bilateral steroid injections for relief of pain not controlled with oral medications.    Informed consent for right knee joint injection obtained after discussion of the risks of infection and bleeding were reviewed  .  Area was cleaned with betadine.  Medial side of right  knee  was injected with 40 mg Kenalog and 4 ml of 1% Xylocaine with a 22 gauge needle under sterile conditions.   Procedure was tolerated well and knee was feeling less painful by the time she left the office. she was advised to rest the knee for 48 hours , apply ice packs every 6 hours for 15 minutes, advised to call if she develops signs of infection (REDNESS, PAIN,  WARMTH)      Cellulitis of right anterior lower leg    Cephalexin 500 mg four times daily x 7 days.  Probiotic advised      Balance problem    Chronic, without vertigo., resulting in recurrent falls. .  Recent head CT notes Prominence of the ventricles and sulci which reflects minor generalized parenchymal volume loss, and atherosclerotic calcification at the skull base. Proximal muscle weakness also  suspected   PT referral In process       Exertional dyspnea    Given her concurrent diabetes and PAD .,  Symptoms may be an anginal  equivalent rather than due to deconditioning.  She has been advised to follow up with cardiology for evaluation       Relevant Orders   Ambulatory referral to Cardiology    Other Visit Diagnoses    Right anterior knee pain    -  Primary   Relevant Medications   triamcinolone acetonide (KENALOG-40) injection 40 mg (Completed)   Cellulitis of right lower extremity          I am having Shirlyn B. Quinteros start on rosuvastatin and cephALEXin. I am also having her  maintain her glucose blood, meclizine, nitroGLYCERIN, hyoscyamine, ezetimibe, ALPRAZolam, levothyroxine, sertraline, Cartia XT, and omeprazole. We administered triamcinolone acetonide.  Meds ordered this encounter  Medications   rosuvastatin (CRESTOR) 20 MG tablet    Sig: Take 1 tablet (20 mg total) by mouth daily.    Dispense:  30 tablet    Refill:  0   cephALEXin (KEFLEX) 500 MG capsule    Sig: Take 1 capsule (500 mg total) by mouth 4 (four) times daily.    Dispense:  28 capsule    Refill:  0   triamcinolone acetonide (KENALOG-40) injection 40 mg   DISCONTD: lidocaine (XYLOCAINE) 1 % (with pres) injection 5 mL    Medications Discontinued During This Encounter  Medication Reason   lidocaine (XYLOCAINE) 1 % (with pres) injection 5 mL     Follow-up: Return in about 2 weeks (around 06/01/2020).   Crecencio Mc, MD

## 2020-05-18 NOTE — Patient Instructions (Addendum)
Please BE GOOD TO YOUR KNEE FOR 72 HOURS   You have cellulitis of the left because you have infected your scabs by picking   cephALEXIN 500 mg every 6 hours  Benadryl 12.5 mg to 25 mg every 8 hours for the itching  Use calamine lotion for the itching and WEAR SOCKS AT NIGHT !

## 2020-05-20 DIAGNOSIS — R0609 Other forms of dyspnea: Secondary | ICD-10-CM | POA: Insufficient documentation

## 2020-05-20 NOTE — Assessment & Plan Note (Addendum)
Chronic, without vertigo., resulting in recurrent falls. .  Recent head CT notes Prominence of the ventricles and sulci which reflects minor generalized parenchymal volume loss, and atherosclerotic calcification at the skull base. Proximal muscle weakness also  suspected   PT referral In process

## 2020-05-20 NOTE — Assessment & Plan Note (Signed)
Cephalexin 500 mg four times daily x 7 days.  Probiotic advised

## 2020-05-20 NOTE — Assessment & Plan Note (Signed)
Secondary to moderate OA .   Patient has requested bilateral steroid injections for relief of pain not controlled with oral medications.    Informed consent for right knee joint injection obtained after discussion of the risks of infection and bleeding were reviewed  .  Area was cleaned with betadine.  Medial side of right  knee  was injected with 40 mg Kenalog and 4 ml of 1% Xylocaine with a 22 gauge needle under sterile conditions.   Procedure was tolerated well and knee was feeling less painful by the time she left the office. she was advised to rest the knee for 48 hours , apply ice packs every 6 hours for 15 minutes, advised to call if she develops signs of infection (REDNESS, PAIN,  WARMTH)

## 2020-05-20 NOTE — Addendum Note (Signed)
Addended by: Crecencio Mc on: 05/20/2020 02:30 PM   Modules accepted: Orders

## 2020-05-20 NOTE — Assessment & Plan Note (Signed)
No treated due to tracheal deviation resulting in mask intolerance.

## 2020-05-20 NOTE — Assessment & Plan Note (Addendum)
She remains  well-controlled on low GI diet.  She  has no microalbuminuria. Patient is tolerating statin therapy for CAD risk reduction  .  Lab Results  Component Value Date   HGBA1C 6.8 (H) 05/14/2020   Lab Results  Component Value Date   MICROALBUR <0.7 03/11/2019

## 2020-05-20 NOTE — Assessment & Plan Note (Signed)
Given her concurrent diabetes and PAD .,  Symptoms may be an anginal equivalent rather than due to deconditioning.  She has been advised to follow up with cardiology for evaluation

## 2020-06-06 ENCOUNTER — Ambulatory Visit (INDEPENDENT_AMBULATORY_CARE_PROVIDER_SITE_OTHER): Payer: Medicare Other | Admitting: Internal Medicine

## 2020-06-06 ENCOUNTER — Other Ambulatory Visit: Payer: Self-pay

## 2020-06-06 ENCOUNTER — Encounter: Payer: Self-pay | Admitting: Internal Medicine

## 2020-06-06 VITALS — BP 128/62 | HR 89 | Temp 97.3°F | Resp 16 | Ht 65.0 in | Wt 151.4 lb

## 2020-06-06 DIAGNOSIS — L03115 Cellulitis of right lower limb: Secondary | ICD-10-CM

## 2020-06-06 DIAGNOSIS — M25561 Pain in right knee: Secondary | ICD-10-CM

## 2020-06-06 DIAGNOSIS — E538 Deficiency of other specified B group vitamins: Secondary | ICD-10-CM | POA: Diagnosis not present

## 2020-06-06 DIAGNOSIS — G8929 Other chronic pain: Secondary | ICD-10-CM

## 2020-06-06 DIAGNOSIS — M25562 Pain in left knee: Secondary | ICD-10-CM | POA: Diagnosis not present

## 2020-06-06 DIAGNOSIS — R600 Localized edema: Secondary | ICD-10-CM

## 2020-06-06 MED ORDER — CYANOCOBALAMIN 1000 MCG/ML IJ SOLN
1000.0000 ug | Freq: Once | INTRAMUSCULAR | Status: AC
  Administered 2020-06-06: 09:00:00 1000 ug via INTRAMUSCULAR

## 2020-06-06 MED ORDER — TRIAMCINOLONE ACETONIDE 40 MG/ML IJ SUSP
40.0000 mg | Freq: Once | INTRAMUSCULAR | Status: AC
  Administered 2020-06-06: 09:00:00 40 mg via INTRAMUSCULAR

## 2020-06-06 MED ORDER — LIDOCAINE HCL 1 % IJ SOLN
4.0000 mL | Freq: Once | INTRAMUSCULAR | Status: AC
  Administered 2020-06-06: 09:00:00 4 mL

## 2020-06-06 NOTE — Progress Notes (Addendum)
Subjective:  Patient ID: Megan Bradley, female    DOB: [birth year]  Age: 79 y.o. MRN: [id number]  CC: The primary encounter diagnosis was Bilateral chronic knee pain. Diagnoses of B12 deficiency, Chronic pain of left knee, Cellulitis of right anterior lower leg, and Bilateral lower extremity edema were also pertinent to this visit.  HPI KARLYE DIMMOCK presents for left knee injection and follow up on lowe leg cellulitis  .Marland Kitchen She has been having increased chronic pain due to DJD .  She denies recent trauma,  Cellulitis or recent injection in same knee.    This visit occurred during the SARS-CoV-2 public health emergency.  Safety protocols were in place, including screening questions prior to the visit, additional usage of staff PPE, and extensive cleaning of exam room while observing appropriate contact time as indicated for disinfecting solutions.     DJD:  Her right knee was injected 2 weeks ago and she reports mild but incomplete relief of pain.  Contributing factors include recent kneeling on knees for prolonged period of time while preparing Christmas dinner.   Her cellulitis of the lower extremity has resolved with use of antibiotic and avoidance of  "picking " at her scabs .  She notes mild edema at both ankles/feet . Worse by the end of the day , denies calf pain .  No recent travel or surgery. 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 ALPRAZolam (XANAX) 0.5 MG tablet Take 1 tablet by mouth twice daily as needed for anxiety 60 tablet 2  . CARTIA XT 120 MG 24 hr capsule TAKE 1 CAPSULE BY MOUTH  DAILY 90 capsule 3  . ezetimibe (ZETIA) 10 MG tablet TAKE 1 TABLET BY MOUTH  DAILY 90 tablet 3  . glucose blood test strip For One touch Verio Flex .  Use to check 3 times daily   e11.9 100 each 0  . hyoscyamine (LEVSIN SL) 0.125 MG SL tablet Place 1 tablet (0.125 mg total) under the tongue every 4  (four) hours as needed. 30 tablet 5  . levothyroxine (SYNTHROID) 112 MCG tablet TAKE 1 TABLET BY MOUTH  DAILY BEFORE BREAKFAST 90 tablet 3  . meclizine (ANTIVERT) 25 MG tablet TAKE 1 TABLET BY MOUTH THREE TIMES DAILY AS NEEDED FOR VERTIGO    . nitroGLYCERIN (NITROSTAT) 0.4 MG SL tablet DISSOLVE ONE TABLET UNDER THE TONGUE EVERY 5 MINUTES AS NEEDED FOR CHEST PAIN.  DO NOT EXCEED A TOTAL OF 3 DOSES IN 15 MINUTES 20 tablet 3  . omeprazole (PRILOSEC) 40 MG capsule     . rosuvastatin (CRESTOR) 20 MG tablet Take 1 tablet (20 mg total) by mouth daily. 30 tablet 0  . sertraline (ZOLOFT) 100 MG tablet TAKE 2 TABLETS BY MOUTH  DAILY 180 tablet 3  . cephALEXin (KEFLEX) 500 MG capsule Take 1 capsule (500 mg total) by mouth 4 (four) times daily. (Patient not taking: Reported on 06/06/2020) 28 capsule 0   No facility-administered medications prior to visit.    Review of Systems;  Patient denies headache, fevers, malaise, unintentional weight loss, skin rash, eye pain, sinus congestion and sinus pain, sore throat, dysphagia,  hemoptysis , cough, dyspnea, wheezing, chest pain, palpitations, orthopnea, edema, abdominal pain, nausea, melena, diarrhea, constipation, flank pain, dysuria, hematuria, urinary  Frequency, nocturia, numbness, tingling, seizures,  Focal weakness, Loss of consciousness,  Tremor, insomnia, depression, anxiety, and suicidal ideation.  Objective:  BP 128/62 (BP Location: Left Arm, Patient Position: Sitting, Cuff Size: Normal)   Pulse 89   Temp (!) 97.3 F (36.3 C) (Oral)   Resp 16   Ht 5\' 5"  (1.651 m)   Wt 151 lb 6.4 oz (68.7 kg)   SpO2 96%   BMI 25.19 kg/m   BP Readings from Last 3 Encounters:  06/06/20 128/62  05/18/20 124/68  05/08/20 (!) 157/77    Wt Readings from Last 3 Encounters:  06/06/20 151 lb 6.4 oz (68.7 kg)  05/18/20 151 lb 9.6 oz (68.8 kg)  05/11/20 148 lb (67.1 kg)    General appearance: alert, cooperative and appears stated age Ears: normal TM's and  external ear canals both ears Throat: lips, mucosa, and tongue normal; teeth and gums normal Neck: no adenopathy, no carotid bruit, supple, symmetrical, trachea midline and thyroid not enlarged, symmetric, no tenderness/mass/nodules Back: symmetric, no curvature. ROM normal. No CVA tenderness. Lungs: clear to auscultation bilaterally Heart: regular rate and rhythm, S1, S2 normal, no murmur, click, rub or gallop Abdomen: soft, non-tender; bowel sounds normal; no masses,  no organomegaly Pulses: 2+ and symmetric Skin: Skin color, texture, turgor normal. No rashes or lesions,  Cellulitis resolved.  Lymph nodes: Cervical, supraclavicular, and axillary nodes normal.  Lab Results  Component Value Date   HGBA1C 6.8 (H) 05/14/2020   HGBA1C 6.7 (H) 10/18/2019   HGBA1C 7.1 (H) 03/11/2019    Lab Results  Component Value Date   CREATININE 1.09 05/14/2020   CREATININE 1.02 10/18/2019   CREATININE 0.95 03/11/2019    Lab Results  Component Value Date   WBC 5.7 05/14/2020   HGB 12.5 05/14/2020   HCT 37.8 05/14/2020   PLT 167.0 05/14/2020   GLUCOSE 116 (H) 05/14/2020   CHOL 191 05/14/2020   TRIG 56.0 05/14/2020   HDL 73.70 05/14/2020   LDLDIRECT 93.0 01/02/2016   LDLCALC 106 (H) 05/14/2020   ALT 22 05/14/2020   AST 21 05/14/2020   NA 143 05/14/2020   K 4.0 05/14/2020   CL 105 05/14/2020   CREATININE 1.09 05/14/2020   BUN 14 05/14/2020   CO2 30 05/14/2020   TSH 3.67 05/14/2020   INR 0.9 12/05/2011   HGBA1C 6.8 (H) 05/14/2020   MICROALBUR <0.7 03/11/2019    CT Head Wo Contrast  Result Date: 05/08/2020 CLINICAL DATA:  Fall, facial trauma EXAM: CT HEAD WITHOUT CONTRAST TECHNIQUE: Contiguous axial images were obtained from the base of the skull through the vertex without intravenous contrast. COMPARISON:  2009 FINDINGS: Brain: There is no acute intracranial hemorrhage, mass effect, or edema. Gray-white differentiation is preserved. There is no extra-axial fluid collection. Prominence  of the ventricles and sulci reflects minor generalized parenchymal volume loss. Vascular: There is atherosclerotic calcification at the skull base. Skull: Calvarium is unremarkable. Sinuses/Orbits: Dictated separately. Other: None. IMPRESSION: No evidence acute intracranial injury. Electronically Signed   By: Macy Mis M.D.   On: 05/08/2020 16:13   CT Cervical Spine Wo Contrast  Result Date: 05/08/2020 CLINICAL DATA:  Fall, facial trauma EXAM: CT CERVICAL SPINE WITHOUT CONTRAST TECHNIQUE: Multidetector CT imaging of the cervical spine was performed without intravenous contrast. Multiplanar CT image reconstructions were also generated. COMPARISON:  None. FINDINGS: Alignment: Trace anterolisthesis at C4-C5. Skull base and vertebrae: No acute cervical spine fracture. Vertebral body heights are maintained. Soft tissues and spinal canal: No prevertebral fluid or swelling. No visible canal hematoma. Disc levels: Multilevel degenerative changes are present including disc space narrowing, endplate osteophytes, and facet  and uncovertebral hypertrophy. No high-grade stenosis. Upper chest: No apical lung mass. Other: None. IMPRESSION: No acute cervical spine fracture. Electronically Signed   By: Macy Mis M.D.   On: 05/08/2020 16:22   DG FEMUR, MIN 2 VIEWS RIGHT  Result Date: 05/08/2020 CLINICAL DATA:  Right hip and knee pain after fall today EXAM: RIGHT FEMUR 2 VIEWS COMPARISON:  12/30/2017 FINDINGS: Frontal and lateral views of the right femur demonstrate no acute displaced fractures. There is moderate 3 compartmental osteoarthritis within the right knee. Right hip is unremarkable. Soft tissues are normal. IMPRESSION: 1. No acute displaced fracture. 2. Moderate 3 compartmental right knee osteoarthritis. Electronically Signed   By: Randa Ngo M.D.   On: 05/08/2020 18:40   CT Maxillofacial Wo Contrast  Result Date: 05/08/2020 CLINICAL DATA:  Fall, facial trauma EXAM: CT MAXILLOFACIAL WITHOUT  CONTRAST TECHNIQUE: Multidetector CT imaging of the maxillofacial structures was performed. Multiplanar CT image reconstructions were also generated. COMPARISON:  None. FINDINGS: Osseous: No acute facial fracture. Degenerative changes at temporomandibular joints. Orbits: No intraorbital hematoma. Sinuses: Minor mucosal thickening. Soft tissues: Right periorbital and frontal scalp soft tissue swelling/hematoma. Limited intracranial: Dictated separately. IMPRESSION: No acute facial fracture. Electronically Signed   By: Macy Mis M.D.   On: 05/08/2020 16:18    Assessment & Plan:   Problem List Items Addressed This Visit      Unprioritized   B12 deficiency    IM injection given today.        Bilateral chronic knee pain - Primary      Patient has requested a steroid injection for relief of pain not controlled with oral medications.    Informed consent for joint injection obtained after discussion of the risks of infection and bleeding were reviewed  .  Area was cleaned with betadine.  Medial side of left knee  was injected with 40 mg Kenalog and 4 ml of 1% Xylocaine with a 22 gauge needle under sterile conditions.   Procedure was tolerated well and knee was feeling less painful by the time he left the office.  he was advised to rest the knee for 48 hours , apply ice packs every 6 hours for 15 minutes, advised to call if he develops signs of infection (REDNESS, PAIN,  WARMTH)      Bilateral lower extremity edema    Likely due to VI and use of cardizem.  She has no history of heart failure or nephrotic syndrome. Advised to use compression knee highs,  Which would also prevent her from picking at her legs .       Cellulitis of right anterior lower leg    Resolved with antibiotic.  Encouraged to avoid recurrent infection caused by picking at legs        Other Visit Diagnoses    Chronic pain of left knee       Relevant Medications   lidocaine (XYLOCAINE) 1 % (with pres) injection 4 mL  (Completed)   triamcinolone acetonide (KENALOG-40) injection 40 mg (Completed)      I have discontinued Vaughan Basta B. Sanpedro's cephALEXin. I am also having her maintain her glucose blood, meclizine, nitroGLYCERIN, hyoscyamine, ezetimibe, ALPRAZolam, levothyroxine, sertraline, Cartia XT, omeprazole, and rosuvastatin. We administered cyanocobalamin, lidocaine, and triamcinolone acetonide.  Meds ordered this encounter  Medications  . cyanocobalamin ((VITAMIN B-12)) injection 1,000 mcg  . lidocaine (XYLOCAINE) 1 % (with pres) injection 4 mL  . triamcinolone acetonide (KENALOG-40) injection 40 mg    Medications Discontinued During This Encounter  Medication  Reason  . cephALEXin (KEFLEX) 500 MG capsule     Follow-up: No follow-ups on file.   Sherlene Shams, MD

## 2020-06-06 NOTE — Patient Instructions (Addendum)
Please BABY your left knee for  the next 72 hours,  No deep knee bends,  No squatting,  Minimal stair climbing .  Ok to apply ice for 15 minutes every 6 hours  Your question about fluid retention is a valid one but we will need to address in another visit.  Most likely it is due to your Cartia XT ,  Since calcium channel blockers are known to cause fluid retention especially in patients with varicose veins  Try using socks with light compression during the day,  And elevate feet at night.   And please  remember:  MyChart should not be used to send symptoms that may suggest an infection or a new or worsening medical problem. It should be used to ask a Non-urgent medical question or to convey information that I have  requested (ie. BP readings, Blood sugars readings, messages back to identify medications, or visit follow-up questions). It is not appropriate to request treatment  through MyChart messages. If the office cannot give you an appointment in an urgent slot, you are advised to schedule an E-Visit with a [facility] Provider or go to the nearest Urgent Care clinic.   \

## 2020-06-07 ENCOUNTER — Other Ambulatory Visit: Payer: Self-pay | Admitting: Internal Medicine

## 2020-06-08 NOTE — Assessment & Plan Note (Signed)
Resolved with antibiotic.  Encouraged to avoid recurrent infection caused by picking at legs

## 2020-06-08 NOTE — Assessment & Plan Note (Signed)
Likely due to VI and use of cardizem.  She has no history of heart failure or nephrotic syndrome. Advised to use compression knee highs,  Which would also prevent her from picking at her legs .

## 2020-06-08 NOTE — Assessment & Plan Note (Signed)
Patient has requested a steroid injection for relief of pain not controlled with oral medications.    Informed consent for joint injection obtained after discussion of the risks of infection and bleeding were reviewed  .  Area was cleaned with betadine.  Medial side of left knee  was injected with 40 mg Kenalog and 4 ml of 1% Xylocaine with a 22 gauge needle under sterile conditions.   Procedure was tolerated well and knee was feeling less painful by the time he left the office.  he was advised to rest the knee for 48 hours , apply ice packs every 6 hours for 15 minutes, advised to call if he develops signs of infection (REDNESS, PAIN,  WARMTH)

## 2020-06-08 NOTE — Assessment & Plan Note (Signed)
IM injection given today.

## 2020-06-19 ENCOUNTER — Ambulatory Visit: Payer: Medicare Other | Attending: Internal Medicine

## 2020-06-19 ENCOUNTER — Other Ambulatory Visit: Payer: Self-pay

## 2020-06-19 DIAGNOSIS — M25561 Pain in right knee: Secondary | ICD-10-CM | POA: Insufficient documentation

## 2020-06-19 DIAGNOSIS — G8929 Other chronic pain: Secondary | ICD-10-CM | POA: Diagnosis not present

## 2020-06-19 DIAGNOSIS — Z0001 Encounter for general adult medical examination with abnormal findings: Secondary | ICD-10-CM

## 2020-06-19 DIAGNOSIS — M25562 Pain in left knee: Secondary | ICD-10-CM | POA: Insufficient documentation

## 2020-06-19 DIAGNOSIS — E113299 Type 2 diabetes mellitus with mild nonproliferative diabetic retinopathy without macular edema, unspecified eye: Secondary | ICD-10-CM

## 2020-06-19 DIAGNOSIS — R2681 Unsteadiness on feet: Secondary | ICD-10-CM | POA: Diagnosis not present

## 2020-06-19 DIAGNOSIS — I10 Essential (primary) hypertension: Secondary | ICD-10-CM

## 2020-06-19 DIAGNOSIS — R262 Difficulty in walking, not elsewhere classified: Secondary | ICD-10-CM | POA: Diagnosis not present

## 2020-06-19 DIAGNOSIS — E538 Deficiency of other specified B group vitamins: Secondary | ICD-10-CM

## 2020-06-20 ENCOUNTER — Encounter: Payer: Self-pay | Admitting: Cardiovascular Disease

## 2020-06-20 ENCOUNTER — Ambulatory Visit: Payer: Medicare Other | Admitting: Cardiovascular Disease

## 2020-06-20 VITALS — BP 124/66 | HR 67 | Ht 65.0 in | Wt 150.0 lb

## 2020-06-20 DIAGNOSIS — E782 Mixed hyperlipidemia: Secondary | ICD-10-CM | POA: Diagnosis not present

## 2020-06-20 DIAGNOSIS — R55 Syncope and collapse: Secondary | ICD-10-CM

## 2020-06-20 DIAGNOSIS — I10 Essential (primary) hypertension: Secondary | ICD-10-CM

## 2020-06-20 DIAGNOSIS — R0602 Shortness of breath: Secondary | ICD-10-CM

## 2020-06-20 DIAGNOSIS — I700 Atherosclerosis of aorta: Secondary | ICD-10-CM

## 2020-06-20 MED ORDER — FUROSEMIDE 20 MG PO TABS: 20.0000 mg | ORAL_TABLET | Freq: Every day | ORAL | 3 refills | Status: DC | PRN

## 2020-06-20 NOTE — Patient Instructions (Signed)
Medication Instructions:  No changes  If you need a refill on your cardiac medications before your next appointment, please call your pharmacy.    Lab work: No new labs needed   If you have labs (blood work) drawn today and your tests are completely normal, you will receive your results only by: . MyChart Message (if you have MyChart) OR . A paper copy in the mail If you have any lab test that is abnormal or we need to change your treatment, we will call you to review the results.   Testing/Procedures: No new testing needed   Follow-Up: At [facility], you and your health needs are our priority.  As part of our continuing mission to provide you with exceptional heart care, we have created designated Provider Care Teams.  These Care Teams include your primary Cardiologist (physician) and Advanced Practice Providers (APPs -  Physician Assistants and Nurse Practitioners) who all work together to provide you with the care you need, when you need it.  . You will need a follow up appointment in 12 months  . Providers on your designated Care Team:   . Christopher Berge, NP . Ryan Dunn, PA-C . Jacquelyn Visser, PA-C  Any Other Special Instructions Will Be Listed Below (If Applicable).  COVID-19 Vaccine Information can be found at: [email or web address] For questions related to vaccine distribution or appointments, please email [email or web address] or call [phone].     

## 2020-06-20 NOTE — Progress Notes (Signed)
Cardiology Office Note  Date:  06/20/2020   ID:  Megan Bradley, DOB August 27, 1940, MRN [id number]  PCP:  Crecencio Mc, MD   Chief Complaint  Patient presents with  . New Patient (Initial Visit)    Referred by Dr. Derrel Nip for exertional dyspnea  Pt states she gets SOB when going up stairs. States both feet and ankles swell--left worse than right. States she fell a couple of weeks ago--no dizziness, just loss of balance--had a concussion.    HPI:  Megan Bradley is a 80 year old woman past medical history of  syncope , symptoms improved by holding lisinopril and HCTZ.   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 Selmer [facility] follow-up  Had manometry study   EKG shows normal sinus rhythm with rate 67 beats per minute with right bundle branch block, no other significant ST or T wave changes  Other past medical history reviewed  chest pain 2013,  normal CT scan chest, normal exercise treadmill test, Normal LV function by echocardiogram  Previous echocardiogram many years ago showed diastolic dysfunction with normal LV function   PMH:   has a past medical history of Anxiety, Anxiety disorder, Cervical dysplasia, Chest pain, GERD (gastroesophageal reflux disease), Herpes zoster, Hypothyroidism, Orthostatic hypotension, Pre-diabetes, RBBB, Vertigo, and Wears dentures.  PSH:    Past Surgical History:  Procedure Laterality Date  . AUGMENTATION MAMMAPLASTY Bilateral   . BREAST EXCISIONAL BIOPSY Left   . CARDIAC CATHETERIZATION  2003   Dr.Callwood, R/L heart cath,  . COLONOSCOPY WITH PROPOFOL N/A 06/12/2017   Procedure: COLONOSCOPY WITH PROPOFOL;  Surgeon: Lucilla Lame, MD;  Location: [place];  Service: Endoscopy;  Laterality: N/A;  . ESOPHAGEAL MANOMETRY N/A 06/19/2016   Procedure: ESOPHAGEAL MANOMETRY (EM);  Surgeon: Lucilla Lame, MD;  Location: [facility];  Service: Endoscopy;  Laterality: N/A;  . ESOPHAGOGASTRODUODENOSCOPY (EGD) WITH PROPOFOL N/A 04/03/2017   Procedure: ESOPHAGOGASTRODUODENOSCOPY (EGD) WITH PROPOFOL;  Surgeon: Lucilla Lame, MD;  Location: [place];  Service: Endoscopy;  Laterality: N/A;  diabetic - diet controlled  . THYROIDECTOMY  05/15/15   [facility]    Current Outpatient Medications  Medication Sig Dispense Refill  . ALPRAZolam (XANAX) 0.5 MG tablet Take 1 tablet by mouth twice daily as needed for anxiety 60 tablet 2  . CARTIA XT 120 MG 24 hr capsule TAKE 1 CAPSULE BY MOUTH  DAILY 90 capsule  3  . ezetimibe (ZETIA) 10 MG tablet TAKE 1 TABLET BY MOUTH  DAILY 90 tablet 3  . furosemide (LASIX) 20 MG tablet Take 1 tablet (20 mg total) by mouth daily as needed. 30 tablet 3  . glucose blood test strip For One touch Verio Flex .  Use to check 3 times daily   e11.9 100 each 0  . hyoscyamine (LEVSIN SL) 0.125 MG SL tablet Place 1 tablet (0.125 mg total) under the tongue every 4 (four) hours as  needed. 30 tablet 5  . levothyroxine (SYNTHROID) 112 MCG tablet TAKE 1 TABLET BY MOUTH  DAILY BEFORE BREAKFAST 90 tablet 3  . meclizine (ANTIVERT) 25 MG tablet TAKE 1 TABLET BY MOUTH THREE TIMES DAILY AS NEEDED FOR VERTIGO    . nitroGLYCERIN (NITROSTAT) 0.4 MG SL tablet DISSOLVE ONE TABLET UNDER THE TONGUE EVERY 5 MINUTES AS NEEDED FOR CHEST PAIN.  DO NOT EXCEED A TOTAL OF 3 DOSES IN 15 MINUTES 20 tablet 3  . omeprazole (PRILOSEC) 40 MG capsule TAKE 1 CAPSULE BY MOUTH  TWICE DAILY 180 capsule 3  . rosuvastatin (CRESTOR) 20 MG tablet Take 1 tablet (20 mg total) by mouth daily. 30 tablet 0  . sertraline (ZOLOFT) 100 MG tablet TAKE 2 TABLETS BY MOUTH  DAILY 180 tablet 3   No current facility-administered medications for this visit.     Allergies:   Atorvastatin, Codeine, Fentanyl, Lipitor [atorvastatin calcium], Statins, and Tetracyclines & related   Social History:  The patient  reports that she has never smoked. She has never used smokeless tobacco. She reports current alcohol use. She reports that she does not use drugs.   Family History:   family history includes Alcohol abuse in her mother; Colon cancer (age of onset: 31) in her paternal grandmother; Heart attack in her father and mother; Heart disease (age of onset: 61) in her father; Heart disease (age of onset: 3) in her mother; Mental illness in her mother.    Review of Systems: Review of Systems  Constitutional: Negative.   HENT: Negative.   Respiratory: Positive for shortness of breath.   Cardiovascular: Negative.   Gastrointestinal: Negative.   Musculoskeletal: Negative.   Neurological: Negative.   Psychiatric/Behavioral: Negative.   All other systems reviewed and are negative.    PHYSICAL EXAM: VS:  BP 124/66   Pulse 67   Ht 5\' 5"  (1.651 m)   Wt 150 lb (68 kg)   BMI 24.96 kg/m  , BMI Body mass index is 24.96 kg/m. GEN: Well nourished, well developed, in no acute distress HEENT: normal Neck: no JVD, carotid  bruits, or masses Cardiac: RRR; no murmurs, rubs, or gallops,no edema  Respiratory:  clear to auscultation bilaterally, normal work of breathing GI: soft, nontender, nondistended, + BS MS: no deformity or atrophy Skin: warm and dry, no rash Neuro:  Strength and sensation are intact Psych: euthymic mood, full affect   Recent Labs: 10/18/2019: Magnesium 1.9 05/14/2020: ALT 22; BUN 14; Creatinine, Ser 1.09; Hemoglobin 12.5; Platelets 167.0; Potassium 4.0; Sodium 143; TSH 3.67    Lipid Panel Lab Results  Component Value Date   CHOL 191 05/14/2020   HDL 73.70 05/14/2020   LDLCALC 106 (H) 05/14/2020   TRIG 56.0 05/14/2020      Wt Readings from Last 3 Encounters:  06/20/20 150 lb (68 kg)  06/06/20 151 lb 6.4 oz (68.7 kg)  05/18/20 151 lb 9.6 oz (68.8 kg)     ASSESSMENT AND PLAN:  Problem List Items Addressed  This Visit      Cardiology Problems   Hyperlipidemia   Relevant Medications   furosemide (LASIX) 20 MG tablet   Other Relevant Orders   EKG 12-Lead    Other Visit Diagnoses    Aortic atherosclerosis ([place])    -  Primary   Relevant Medications   furosemide (LASIX) 20 MG tablet   SOB (shortness of breath)       Relevant Orders   EKG 12-Lead   Benign essential HTN       Relevant Medications   furosemide (LASIX) 20 MG tablet   Other Relevant Orders   EKG 12-Lead     Pure hypercholesterolemia Tolerating Zetia daily Previous intolerance to statin daily, she is able to tolerate Crestor 20 mg, 2 days a week  Aortic arch atherosclerosis ([facility]) Prior CT scan images no significant coronary calcifications, no calcified plaque noted in the ascending or descending aorta Asymptomatic, no further work-up needed  Atypical chest pain Reports symptoms typically well controlled with Levsin Rarely needs to take nitro which stops at her pressure  Diabetes mellitus without complication ([place]) We have encouraged continued exercise, careful diet management in an effort to lose  weight.  Shortness of breath Long discussion with her, not on the flat surface, happens typically with hills and stairs She feels it is from deconditioning and just needs to exercise more We did discuss echocardiogram and other testing such as stress testing She prefers to hold off at this time Recommend a regular walking program, also suggested if symptoms get worse that she call our office  Orthostatic hypotension Denies significant lightheadedness or dizziness, no recent falls    Total encounter time more than 45 minutes  Greater than 50% was spent in counseling and coordination of care with the patient    Signed, Esmond Plants, M.D., Ph.D. [place], [place]

## 2020-06-20 NOTE — Therapy (Signed)
[place] [facility] [address] [place], [address] Phone: [phone]   Fax:  [phone]  Physical Therapy Evaluation  Patient Details  Name: Megan Bradley MRN: [id number] Date of Birth: [birth year] Referring Provider (PT): Derrel Nip md   Encounter Date: 06/19/2020   PT End of Session - 06/20/20 0736    Visit Number 1    Number of Visits 17    Date for PT Re-Evaluation 07/31/20    Authorization Type 1/10    PT Start Time 1015    PT Stop Time 1115    PT Time Calculation (min) 60 min    Activity Tolerance Patient tolerated treatment well;No increased pain;Treatment limited secondary to medical complications (Comment)   Upper respiratory congestion   Behavior During Therapy [facility] for tasks assessed/performed           Past Medical History:  Diagnosis Date  . Anxiety   . Anxiety disorder   . Cervical dysplasia    a. 2010 - high grade squamous intraepithelial lesion s/p excision.  . Chest pain    a. 2006 or 2007 Cath [facility]): reportedly nl cors;  b. 09/2011 ETT: nl.  . GERD (gastroesophageal reflux disease)   . Herpes zoster   . Hypothyroidism   . Orthostatic hypotension   . Pre-diabetes   . RBBB   . Vertigo   . Wears dentures    partial lower    Past Surgical History:  Procedure Laterality Date  . AUGMENTATION MAMMAPLASTY Bilateral   . BREAST EXCISIONAL BIOPSY Left   . CARDIAC CATHETERIZATION  2003   Dr.Callwood, R/L heart cath,  . COLONOSCOPY WITH PROPOFOL N/A 06/12/2017   Procedure: COLONOSCOPY WITH PROPOFOL;  Surgeon: Lucilla Lame, MD;  Location: [place];  Service: Endoscopy;  Laterality: N/A;  . ESOPHAGEAL MANOMETRY N/A 06/19/2016   Procedure: ESOPHAGEAL MANOMETRY (EM);  Surgeon: Lucilla Lame, MD;  Location: [facility];  Service: Endoscopy;  Laterality: N/A;  . ESOPHAGOGASTRODUODENOSCOPY (EGD) WITH PROPOFOL N/A 04/03/2017   Procedure: ESOPHAGOGASTRODUODENOSCOPY (EGD) WITH PROPOFOL;   Surgeon: Lucilla Lame, MD;  Location: [place];  Service: Endoscopy;  Laterality: N/A;  diabetic - diet controlled  . THYROIDECTOMY  05/15/15   [facility]    There were no vitals filed for this visit.    Subjective Assessment - 06/19/20 1022    Subjective Patient reports she fell 5 weeks ago when hanging a wreath on a tree. Patient reports when she fell, she sustained a concussion. Patient states she has been having increased difficulty with her balance overall; Most notably states increased difficulty in terms of balancing with turning, going from sitting to standing, and performing step ups. Patient states increased difficulty with performing prolonged walking. Patient denies diplopia, dysarrthria, dysphagia. Patient states she has increased pain along the R and L knee. Patient states she had an injection B along each knees. Patient states the R knee is worse than the L. Patient states she manages knee with use of tylenol.    Pertinent History B knee OA and pain. Reports onset 2-3 years ago with R worsening before L. She received an injection in R (presumed cortisone) with good results. A second round of shots in R/L knees worsened her pain. Reports pain only with activity. Worst pain 5/10 R, 9/10 L. Agg: straightening L knee after prolonged flexion. Ease: activity, rest. Comorbities: orthostatic hypotension, DM. PMH of quadriceps tear in LLE, reported to be fully healed. Reports that walking is okay but incline/decline  and transfers still bother her.    Limitations Standing;Walking;House hold activities;Sitting    How long can you sit comfortably? Indefinitely but requires UE assist for sit <> stand    How long can you stand comfortably? Indefinitely    How long can you walk comfortably? Cannot walk comfortably    Diagnostic tests pt reports x-ray positive for "arthritis" in B knees    Patient Stated Goals To be able to walk and rise from sitting without pain    Currently in Pain? No/denies     Pain Score --   8/10 worst   Pain Location Knee    Pain Orientation Right;Left    Pain Descriptors / Indicators Aching    Pain Type Chronic pain    Pain Onset In the past 7 days              [facility] PT Assessment - 06/20/20 0001      Assessment   Medical Diagnosis unsteadiness on feet    Referring Provider (PT) tullo md    Onset Date/Surgical Date 06/09/16    Hand Dominance Right    Next MD Visit unknown    Prior Therapy yes      Balance Screen   Has the patient fallen in the past 6 months No    Has the patient had a decrease in activity level because of a fear of falling?  No    Is the patient reluctant to leave their home because of a fear of falling?  No      Home Ecologist residence      Prior Function   Level of Independence Independent    Vocation Retired    Biomedical scientist N/A    Leisure Sedentary, household chores      Cognition   Overall Cognitive Status Within Functional Limits for tasks assessed      Observation/Other Assessments   Observations Increased valgus moment along knees in standing      Sensation   Light Touch Appears Intact      Functional Tests   Functional tests Sit to Stand      Sit to Stand   Comments Increased time to perform, increased functional knee valgus when performing, wide stance and multiple attempts required to perform      ROM / Strength   AROM / PROM / Strength Strength;AROM      AROM   Overall AROM Comments All mobility WNL      Strength   Strength Assessment Site Hip;Knee;Ankle    Right/Left Hip Left;Right    Right Hip Flexion 4/5    Right Hip Extension 3+/5    Right Hip External Rotation  3+/5    Right Hip Internal Rotation 4/5    Right Hip ABduction 3/5    Left Hip Flexion 4/5    Left Hip Extension 4-/5    Left Hip External Rotation 4-/5    Left Hip Internal Rotation 4/5    Left Hip ABduction 3/5    Right/Left Knee Right;Left    Right Knee Flexion 4+/5    Right Knee  Extension 4/5   crepitus with performing   Left Knee Flexion 4+/5    Left Knee Extension 4+/5    Right/Left Ankle Left;Right    Right Ankle Dorsiflexion 4+/5    Right Ankle Inversion 4+/5    Right Ankle Eversion 4+/5    Left Ankle Dorsiflexion 4+/5    Left Ankle Inversion 4+/5  Left Ankle Eversion 4+/5      Palpation   Palpation comment TTP along lateral distal vastus lateralis on the R LE      Special Tests    Special Tests Knee Special Tests    Knee Special tests  Step-up/Step Down Test      Step-up/Step Down    Findings Positive    Side  --   B   Comments Positive with valgus and pain B      Transfers   Five time sit to stand comments  33sec      Ambulation/Gait   Gait Comments Increased hip IR with performance, shortened gait, decrease step length      Standardized Balance Assessment   Standardized Balance Assessment Dynamic Gait Index;10 meter walk test    10 Meter Walk .77 m/s      Dynamic Gait Index   Level Surface Normal    Change in Gait Speed Normal    Gait with Horizontal Head Turns Severe Impairment    Gait with Vertical Head Turns Severe Impairment    Gait and Pivot Turn Mild Impairment    Step Over Obstacle Normal    Step Around Obstacles Normal    Steps Mild Impairment    Total Score 16            Objective measurements completed on examination: See above findings.   TREATMENT Therapeutic Exercise STS without UE support -- x 10 Tandem stance in corner -- 2 x 30sec Hip abduction in standing -- x 15  Performed exercises to improve LE strength and improve overall balance      PT Education - 06/19/20 1300    Education Details form/technique with exercise; hip abduction, sit to stands, tandem stance    Person(s) Educated Patient    Methods Explanation;Demonstration    Comprehension Verbalized understanding;Returned demonstration            PT Short Term Goals - 06/20/20 0740      PT SHORT TERM GOAL #1   Title Patient will  demonstrate adherence to HEP at least 3x/wk to speed recovery and reduce total number of visits.    Baseline HEP given    Time 2    Period Weeks    Status New    Target Date 07/03/20             PT Long Term Goals - 06/20/20 0741      PT LONG TERM GOAL #1   Title Patient will be independent and performing exercises 5 days out of one week on average to continue benefits of therapy after discharge.    Baseline Not currently performing ther ex    Time 8    Period Weeks    Status New      PT LONG TERM GOAL #2   Title Patient will demonstrate gross hip strength of 4+/5 B for normalization of gait kinematics and reduced pain.    Baseline Abd/add, ext 3/5;    Time 8    Period Weeks    Status New      PT LONG TERM GOAL #3   Title Patient will improve DGI by 4 points to indicate signficant improvement in fall risk with ambulation    Baseline DGI: 16   Time 6    Period Weeks    Status New      PT LONG TERM GOAL #4   Title Patient will be able to perform 62mwt in over 1 m/s veolocity to  indicate a decrease in fall risk for community ambulation    Baseline 07/31/2020: .71m/s    Time 8    Period Weeks    Status New      PT LONG TERM GOAL #5   Title Patient will be able to perform 5xSTS in under 20 sec to indicate signficant improvement and decrease in fall risk in terms of LE strength    Baseline STS: 34sec    Time 6    Period Weeks    Status New    Target Date 07/31/20                  Plan - 06/20/20 0737    Clinical Impression Statement Patient is a 80 yo right hand dominant female presenting with increased difficulty with walking and knee pain from gradual onset. Patient's demonstrates an increase in fall risk as indicated by decreased scores with performing 5XSTS, 35mwt, and DGI measurements as well as increased B knee pain with increased TTP over the lateral quad as well as a positive step down test B. Patient demonstrates overall poor strength with performance of  hip movements indicating poor standing stabilization. Patient will benefit from further skilled therapy focused on improving limitations to return to prior level of function.    Personal Factors and Comorbidities Age;Time since onset of injury/illness/exacerbation;Comorbidity 1    Comorbidities orthostatic hypotension    Examination-Activity Limitations Sit;Squat;Stairs;Stand    Examination-Participation Restrictions Shop;Community Activity    Stability/Clinical Decision Making Stable/Uncomplicated    Rehab Potential Good    PT Frequency 2x / week    PT Duration 8 weeks    PT Treatment/Interventions Cryotherapy;Electrical Stimulation;Moist Heat;Aquatic Statistician;Therapeutic activities;Therapeutic exercise;Balance training;Neuromuscular re-education;Patient/family education;Manual techniques;Passive range of motion;Dry needling;Energy conservation;Taping;Joint Manipulations    PT Next Visit Plan Pelvic depression/posterior tilt, sideways lunges/squats    PT Home Exercise Plan Eccentric quad with mirror feedback; SLS    Consulted and Agree with Plan of Care Patient           Patient will benefit from skilled therapeutic intervention in order to improve the following deficits and impairments:  Abnormal gait,Difficulty walking,Decreased endurance,Decreased activity tolerance,Pain,Decreased balance,Improper body mechanics,Decreased mobility,Decreased strength,Postural dysfunction  Visit Diagnosis: Difficulty in walking, not elsewhere classified  Chronic pain of left knee  Chronic pain of right knee  Unsteadiness on feet     Problem List Patient Active Problem List   Diagnosis Date Noted  . Exertional dyspnea 05/20/2020  . Balance problem 05/12/2020  . History of recent fall 05/12/2020  . Low back pain 12/02/2019  . Cellulitis of right anterior lower leg 10/18/2019  . Breast disorder in female 10/14/2019  . Ruptured left breast implant, initial encounter  07/05/2018  . Ruptured silicone breast implant 07/05/2018  . Change in bowel habits 07/04/2018  . Bilateral chronic knee pain 04/11/2018  . S/P breast implant, silicone [id number]  . Osteoporosis of femur without pathological fracture 03/11/2018  . 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 Tubular adenoma of colon   . Hot flushes, perimenopausal 01/05/2016  . RBBB   . Chest pain   . Shoulder pain, left 04/10/2015  . OSA (obstructive sleep apnea) 11/22/2013  . B12 deficiency 01/11/2013  . Hypovitaminosis D 01/11/2013  . Deviated septum 01/11/2013  . Fatigue after COVID-19 vaccination 06/09/2012  . Hyperlipidemia 09/04/2011  . Abnormal cells of cervix 08/27/2011  . Multinodular goiter 08/27/2011  . Bilateral lower extremity edema 08/25/2011  . Iatrogenic hypothyroidism   . Diabetes mellitus without complication ([place])   .  GERD (gastroesophageal reflux disease)   . Anxiety with obsessional features     Blythe Stanford, PT DPT 06/20/2020, 7:56 AM  [place] [facility] [address] [place], [address] Phone: [phone]   Fax:  [phone]  Name: Megan Bradley MRN: [id number] Date of Birth: Jul 08, 1940

## 2020-06-25 ENCOUNTER — Ambulatory Visit: Payer: Medicare Other

## 2020-06-27 ENCOUNTER — Ambulatory Visit: Payer: Medicare Other

## 2020-06-28 ENCOUNTER — Ambulatory Visit: Payer: Medicare Other

## 2020-06-28 ENCOUNTER — Other Ambulatory Visit: Payer: Self-pay

## 2020-06-28 DIAGNOSIS — R262 Difficulty in walking, not elsewhere classified: Secondary | ICD-10-CM

## 2020-06-28 DIAGNOSIS — G8929 Other chronic pain: Secondary | ICD-10-CM

## 2020-06-28 DIAGNOSIS — M25562 Pain in left knee: Secondary | ICD-10-CM | POA: Diagnosis not present

## 2020-06-28 DIAGNOSIS — R2681 Unsteadiness on feet: Secondary | ICD-10-CM | POA: Diagnosis not present

## 2020-06-28 DIAGNOSIS — M25561 Pain in right knee: Secondary | ICD-10-CM | POA: Diagnosis not present

## 2020-06-28 NOTE — Therapy (Signed)
[place] [facility] [address], [place], [address] Phone: [phone]   Fax:  [phone]  Physical Therapy Treatment  Patient Details  Name: Megan Bradley MRN: [id number] Date of Birth: 12-04-1940 Referring Provider (PT): Derrel Nip md   Encounter Date: 06/28/2020   PT End of Session - 06/28/20 1705    Visit Number 2    Number of Visits 17    Date for PT Re-Evaluation 07/31/20    Authorization Type 1/10    PT Start Time 1645    PT Stop Time 1730    PT Time Calculation (min) 45 min    Activity Tolerance Patient tolerated treatment well;No increased pain;Treatment limited secondary to medical complications (Comment)   Upper respiratory congestion   Behavior During Therapy [facility] for tasks assessed/performed           Past Medical History:  Diagnosis Date  . Anxiety   . Anxiety disorder   . Cervical dysplasia    a. 2010 - high grade squamous intraepithelial lesion s/p excision.  . Chest pain    a. 2006 or 2007 Cath [facility]): reportedly nl cors;  b. 09/2011 ETT: nl.  . GERD (gastroesophageal reflux disease)   . Herpes zoster   . Hypothyroidism   . Orthostatic hypotension   . Pre-diabetes   . RBBB   . Vertigo   . Wears dentures    partial lower    Past Surgical History:  Procedure Laterality Date  . AUGMENTATION MAMMAPLASTY Bilateral   . BREAST EXCISIONAL BIOPSY Left   . CARDIAC CATHETERIZATION  2003   Dr.Callwood, R/L heart cath,  . COLONOSCOPY WITH PROPOFOL N/A 06/12/2017   Procedure: COLONOSCOPY WITH PROPOFOL;  Surgeon: Lucilla Lame, MD;  Location: [place];  Service: Endoscopy;  Laterality: N/A;  . ESOPHAGEAL MANOMETRY N/A 06/19/2016   Procedure: ESOPHAGEAL MANOMETRY (EM);  Surgeon: Lucilla Lame, MD;  Location: [facility];  Service: Endoscopy;  Laterality: N/A;  . ESOPHAGOGASTRODUODENOSCOPY (EGD) WITH PROPOFOL N/A 04/03/2017   Procedure: ESOPHAGOGASTRODUODENOSCOPY (EGD) WITH PROPOFOL;   Surgeon: Lucilla Lame, MD;  Location: [place];  Service: Endoscopy;  Laterality: N/A;  diabetic - diet controlled  . THYROIDECTOMY  05/15/15   [facility]    There were no vitals filed for this visit.   Subjective Assessment - 06/28/20 1701    Subjective Patient states no major changes since the previous sesssion. Patient states she has continued to have difficulty.    Pertinent History B knee OA and pain. Reports onset 2-3 years ago with R worsening before L. She received an injection in R (presumed cortisone) with good results. A second round of shots in R/L knees worsened her pain. Reports pain only with activity. Worst pain 5/10 R, 9/10 L. Agg: straightening L knee after prolonged flexion. Ease: activity, rest. Comorbities: orthostatic hypotension, DM. PMH of quadriceps tear in LLE, reported to be fully healed. Reports that walking is okay but incline/decline and transfers still bother her.    Limitations Standing;Walking;House hold activities;Sitting    How long can you sit comfortably? Indefinitely but requires UE assist for sit <> stand    How long can you stand comfortably? Indefinitely    How long can you walk comfortably? Cannot walk comfortably    Diagnostic tests pt reports x-ray positive for "arthritis" in B knees    Patient Stated Goals To be able to walk and rise from sitting without pain    Currently in Pain? No/denies  Pain Onset In the past 7 days                TREATMENT Therapeutic Exercise Hip swings forwards - x 45sec Hip swings laterally - x 45 sec  Squats with UE support - 2 x 10  Running man with use of slider and UE support - x15 Hip adduction with use of slider with UE support - x 15 Lunges - 2 x 5 Tandem Stance balance - 2 x 25 sec B SLS with intermittent UE support - 3 x 30 sec  Performed exercises to address strength and balance limitations       PT Education - 06/28/20 1703    Education Details form/technique with exercise     Person(s) Educated Patient    Methods Explanation;Demonstration    Comprehension Verbalized understanding;Returned demonstration            PT Short Term Goals - 06/20/20 0740      PT SHORT TERM GOAL #1   Title Patient will demonstrate adherence to HEP at least 3x/wk to speed recovery and reduce total number of visits.    Baseline HEP given    Time 2    Period Weeks    Status New    Target Date 07/03/20             PT Long Term Goals - 06/28/20 1758      PT LONG TERM GOAL #1   Title Patient will be independent and performing exercises 5 days out of one week on average to continue benefits of therapy after discharge.    Baseline Not currently performing ther ex    Time 8    Period Weeks    Status New      PT LONG TERM GOAL #2   Title Patient will demonstrate gross hip strength of 4+/5 B for normalization of gait kinematics and reduced pain.    Baseline Abd/add, ext 3/5;    Time 8    Period Weeks    Status New      PT LONG TERM GOAL #3   Title Patient will improve DGI by 4 points to indicate signficant improvement in fall risk with ambulation    Baseline 16    Time 6    Period Weeks    Status New      PT LONG TERM GOAL #4   Title Patient will be able to perform 98mwt in over 1 m/s veolocity to indicate a decrease in fall risk for community ambulation    Baseline 07/31/2020: .73m/s    Time 8    Period Weeks    Status New      PT LONG TERM GOAL #5   Title Patient will be able to perform 5xSTS in under 20 sec to indicate signficant improvement and decrease in fall risk in terms of LE strength    Baseline STS: 34sec    Time 6    Period Weeks    Status New      Additional Long Term Goals   Additional Long Term Goals Yes      PT LONG TERM GOAL #6   Title Patient will improve FOTO score to over 64 to indicated signficant improvement in LE functioning    Baseline 62    Time 6    Period Weeks    Status New                 Plan - 06/28/20 1711  Clinical Impression Statement Patient responds well to therapeutic exercise with no increase in pain throughout the entire session. Patient does have increased difficulty with bending her knees under weight bearing positions most notably in single leg positions. Focused on adding more hip strengthening into peforming sit to stands, squats and bending on her knees in weight bearing positions. Will continue to focus on improving limitations to reutrn to prior level of function.    Personal Factors and Comorbidities Age;Time since onset of injury/illness/exacerbation;Comorbidity 1    Comorbidities orthostatic hypotension    Examination-Activity Limitations Sit;Squat;Stairs;Stand    Examination-Participation Restrictions Shop;Community Activity    Stability/Clinical Decision Making Stable/Uncomplicated    Rehab Potential Good    PT Frequency 2x / week    PT Duration 8 weeks    PT Treatment/Interventions Cryotherapy;Electrical Stimulation;Moist Heat;Aquatic Statistician;Therapeutic activities;Therapeutic exercise;Balance training;Neuromuscular re-education;Patient/family education;Manual techniques;Passive range of motion;Dry needling;Energy conservation;Taping;Joint Manipulations    PT Next Visit Plan Pelvic depression/posterior tilt, sideways lunges/squats    PT Home Exercise Plan Eccentric quad with mirror feedback; SLS    Consulted and Agree with Plan of Care Patient           Patient will benefit from skilled therapeutic intervention in order to improve the following deficits and impairments:  Abnormal gait,Difficulty walking,Decreased endurance,Decreased activity tolerance,Pain,Decreased balance,Improper body mechanics,Decreased mobility,Decreased strength,Postural dysfunction  Visit Diagnosis: Chronic pain of left knee  Chronic pain of right knee  Difficulty in walking, not elsewhere classified     Problem List Patient Active Problem List   Diagnosis Date  Noted  . Exertional dyspnea 05/20/2020  . Balance problem 05/12/2020  . History of recent fall 05/12/2020  . Low back pain 12/02/2019  . Cellulitis of right anterior lower leg 10/18/2019  . Breast disorder in female 10/14/2019  . Ruptured left breast implant, initial encounter 07/05/2018  . Ruptured silicone breast implant 07/05/2018  . Change in bowel habits 07/04/2018  . Bilateral chronic knee pain 04/11/2018  . S/P breast implant, silicone [id number]  . Osteoporosis of femur without pathological fracture 03/11/2018  . 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 Tubular adenoma of colon   . Hot flushes, perimenopausal 01/05/2016  . RBBB   . Chest pain   . Shoulder pain, left 04/10/2015  . OSA (obstructive sleep apnea) 11/22/2013  . B12 deficiency 01/11/2013  . Hypovitaminosis D 01/11/2013  . Deviated septum 01/11/2013  . Fatigue after COVID-19 vaccination 06/09/2012  . Hyperlipidemia 09/04/2011  . Abnormal cells of cervix 08/27/2011  . Multinodular goiter 08/27/2011  . Bilateral lower extremity edema 08/25/2011  . Iatrogenic hypothyroidism   . Diabetes mellitus without complication ([place])   . GERD (gastroesophageal reflux disease)   . Anxiety with obsessional features     Blythe Stanford, PT DPT 06/28/2020, 6:00 PM  [place] [facility] [address] [place], [address] Phone: [phone]   Fax:  [phone]  Name: Megan Bradley MRN: [id number] Date of Birth: [birth year]

## 2020-07-02 ENCOUNTER — Other Ambulatory Visit: Payer: Self-pay

## 2020-07-02 ENCOUNTER — Ambulatory Visit: Payer: Medicare Other

## 2020-07-02 DIAGNOSIS — M25562 Pain in left knee: Secondary | ICD-10-CM | POA: Diagnosis not present

## 2020-07-02 DIAGNOSIS — G8929 Other chronic pain: Secondary | ICD-10-CM | POA: Diagnosis not present

## 2020-07-02 DIAGNOSIS — R262 Difficulty in walking, not elsewhere classified: Secondary | ICD-10-CM | POA: Diagnosis not present

## 2020-07-02 DIAGNOSIS — R2681 Unsteadiness on feet: Secondary | ICD-10-CM | POA: Diagnosis not present

## 2020-07-02 DIAGNOSIS — M25561 Pain in right knee: Secondary | ICD-10-CM | POA: Diagnosis not present

## 2020-07-02 NOTE — Therapy (Signed)
[facility] [address], [place], [address] Phone: [phone]   Fax:  [phone]  Physical Therapy Treatment  Patient Details  Name: Megan Bradley MRN: [id number] Date of Birth: 10-Feb-1941 Referring Provider (PT): Derrel Nip md   Encounter Date: 07/02/2020   PT End of Session - 07/02/20 1041    Visit Number 3    Number of Visits 17    Date for PT Re-Evaluation 07/31/20    Authorization Type 1/10    PT Start Time 0945    PT Stop Time 1030    PT Time Calculation (min) 45 min    Equipment Utilized During Treatment Gait belt    Activity Tolerance Patient tolerated treatment well;No increased pain;Treatment limited secondary to medical complications (Comment)   Upper respiratory congestion   Behavior During Therapy [facility] for tasks assessed/performed           Past Medical History:  Diagnosis Date  . Anxiety   . Anxiety disorder   . Cervical dysplasia    a. 2010 - high grade squamous intraepithelial lesion s/p excision.  . Chest pain    a. 2006 or 2007 Cath [facility]): reportedly nl cors;  b. 09/2011 ETT: nl.  . GERD (gastroesophageal reflux disease)   . Herpes zoster   . Hypothyroidism   . Orthostatic hypotension   . Pre-diabetes   . RBBB   . Vertigo   . Wears dentures    partial lower    Past Surgical History:  Procedure Laterality Date  . AUGMENTATION MAMMAPLASTY Bilateral   . BREAST EXCISIONAL BIOPSY Left   . CARDIAC CATHETERIZATION  2003   Dr.Callwood, R/L heart cath,  . COLONOSCOPY WITH PROPOFOL N/A 06/12/2017   Procedure: COLONOSCOPY WITH PROPOFOL;  Surgeon: Lucilla Lame, MD;  Location: [place];  Service: Endoscopy;  Laterality: N/A;  . ESOPHAGEAL MANOMETRY N/A 06/19/2016   Procedure: ESOPHAGEAL MANOMETRY (EM);  Surgeon: Lucilla Lame, MD;  Location: [facility];  Service: Endoscopy;  Laterality: N/A;  . ESOPHAGOGASTRODUODENOSCOPY (EGD) WITH PROPOFOL N/A 04/03/2017   Procedure:  ESOPHAGOGASTRODUODENOSCOPY (EGD) WITH PROPOFOL;  Surgeon: Lucilla Lame, MD;  Location: [place];  Service: Endoscopy;  Laterality: N/A;  diabetic - diet controlled  . THYROIDECTOMY  05/15/15   [facility]    There were no vitals filed for this visit.   Subjective Assessment - 07/02/20 0953    Subjective Patient states no major changes since the previous sesssion. Cleaned the house all weekend with no difficulty or knee pain.    Pertinent History B knee OA and pain. Reports onset 2-3 years ago with R worsening before L. She received an injection in R (presumed cortisone) with good results. A second round of shots in R/L knees worsened her pain. Reports pain only with activity. Worst pain 5/10 R, 9/10 L. Agg: straightening L knee after prolonged flexion. Ease: activity, rest. Comorbities: orthostatic hypotension, DM. PMH of quadriceps tear in LLE, reported to be fully healed. Reports that walking is okay but incline/decline and transfers still bother her.    Limitations Standing;Walking;House hold activities;Sitting    How long can you sit comfortably? Indefinitely but requires UE assist for sit <> stand    How long can you stand comfortably? Indefinitely    How long can you walk comfortably? Cannot walk comfortably    Diagnostic tests pt reports x-ray positive for "arthritis" in B knees    Patient Stated Goals To be able to walk and rise from sitting  without pain    Currently in Pain? No/denies    Pain Onset In the past 7 days             Therapeutic Exercise  Squats with UE support - 1x 10  Banded Lateral Walk- 3x10 RTB Single Leg with intermittent UE support - 5 x 30 sec Step down off 6 in step- 1x12B Step up on to 6in step with airex pad- 2x10 B Supine Glute Bridges- 2 x 12  Performed exercises to address muscular strength     PT Education - 07/02/20 1030    Education Details form/technique with exercise    Person(s) Educated Patient    Methods Explanation;Demonstration     Comprehension Verbalized understanding;Returned demonstration            PT Short Term Goals - 06/20/20 0740      PT SHORT TERM GOAL #1   Title Patient will demonstrate adherence to HEP at least 3x/wk to speed recovery and reduce total number of visits.    Baseline HEP given    Time 2    Period Weeks    Status New    Target Date 07/03/20             PT Long Term Goals - 06/28/20 1758      PT LONG TERM GOAL #1   Title Patient will be independent and performing exercises 5 days out of one week on average to continue benefits of therapy after discharge.    Baseline Not currently performing ther ex    Time 8    Period Weeks    Status New      PT LONG TERM GOAL #2   Title Patient will demonstrate gross hip strength of 4+/5 B for normalization of gait kinematics and reduced pain.    Baseline Abd/add, ext 3/5;    Time 8    Period Weeks    Status New      PT LONG TERM GOAL #3   Title Patient will improve DGI by 4 points to indicate signficant improvement in fall risk with ambulation    Baseline 16    Time 6    Period Weeks    Status New      PT LONG TERM GOAL #4   Title Patient will be able to perform 6mwt in over 1 m/s veolocity to indicate a decrease in fall risk for community ambulation    Baseline 07/31/2020: .75m/s    Time 8    Period Weeks    Status New      PT LONG TERM GOAL #5   Title Patient will be able to perform 5xSTS in under 20 sec to indicate signficant improvement and decrease in fall risk in terms of LE strength    Baseline STS: 34sec    Time 6    Period Weeks    Status New      Additional Long Term Goals   Additional Long Term Goals Yes      PT LONG TERM GOAL #6   Title Patient will improve FOTO score to over 64 to indicated signficant improvement in LE functioning    Baseline 62    Time 6    Period Weeks    Status New                 Plan - 07/02/20 1032    Clinical Impression Statement Patient responded well to therapeutic  exercise with no increase in pain throughout the session,  and improved single leg stance time. Patient tolerated increase in exercise progression well. However demonstrates increased right knee pain with squats and single leg stepdown although this is most likely due to poor eccentric control of the quad. Patient will benefit from skilled therapy to improve limitations in lower extemietes and return to prior level of funtion.    Personal Factors and Comorbidities Age;Time since onset of injury/illness/exacerbation;Comorbidity 1    Comorbidities orthostatic hypotension    Examination-Activity Limitations Sit;Squat;Stairs;Stand    Examination-Participation Restrictions Shop;Community Activity    Stability/Clinical Decision Making Stable/Uncomplicated    Rehab Potential Good    PT Frequency 2x / week    PT Duration 8 weeks    PT Treatment/Interventions Cryotherapy;Electrical Stimulation;Moist Heat;Aquatic Statistician;Therapeutic activities;Therapeutic exercise;Balance training;Neuromuscular re-education;Patient/family education;Manual techniques;Passive range of motion;Dry needling;Energy conservation;Taping;Joint Manipulations    PT Next Visit Plan Pelvic depression/posterior tilt, sideways lunges/squats    PT Home Exercise Plan Eccentric quad with mirror feedback; SLS    Consulted and Agree with Plan of Care Patient           Patient will benefit from skilled therapeutic intervention in order to improve the following deficits and impairments:  Abnormal gait,Difficulty walking,Decreased endurance,Decreased activity tolerance,Pain,Decreased balance,Improper body mechanics,Decreased mobility,Decreased strength,Postural dysfunction  Visit Diagnosis: Chronic pain of left knee  Chronic pain of right knee  Difficulty in walking, not elsewhere classified  Unsteadiness on feet     Problem List Patient Active Problem List   Diagnosis Date Noted  . Exertional dyspnea  05/20/2020  . Balance problem 05/12/2020  . History of recent fall 05/12/2020  . Low back pain 12/02/2019  . Cellulitis of right anterior lower leg 10/18/2019  . Breast disorder in female 10/14/2019  . Ruptured left breast implant, initial encounter 07/05/2018  . Ruptured silicone breast implant 07/05/2018  . Change in bowel habits 07/04/2018  . Bilateral chronic knee pain 04/11/2018  . S/P breast implant, silicone 46/96/2952  . Osteoporosis of femur without pathological fracture 03/11/2018  . 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 Tubular adenoma of colon   . Hot flushes, perimenopausal 01/05/2016  . RBBB   . Chest pain   . Shoulder pain, left 04/10/2015  . OSA (obstructive sleep apnea) 11/22/2013  . B12 deficiency 01/11/2013  . Hypovitaminosis D 01/11/2013  . Deviated septum 01/11/2013  . Fatigue after COVID-19 vaccination 06/09/2012  . Hyperlipidemia 09/04/2011  . Abnormal cells of cervix 08/27/2011  . Multinodular goiter 08/27/2011  . Bilateral lower extremity edema 08/25/2011  . Iatrogenic hypothyroidism   . Diabetes mellitus without complication ([place])   . GERD (gastroesophageal reflux disease)   . Anxiety with obsessional features     Candi Leash SPT 07/02/2020, 10:44 AM  [facility] [address] [place], [address] Phone: [phone]   Fax:  [phone]  Name: Megan Bradley MRN: [id number] Date of Birth: Dec 31, 1940

## 2020-07-04 ENCOUNTER — Ambulatory Visit: Payer: Medicare Other

## 2020-07-04 DIAGNOSIS — D2262 Melanocytic nevi of left upper limb, including shoulder: Secondary | ICD-10-CM | POA: Diagnosis not present

## 2020-07-04 DIAGNOSIS — L570 Actinic keratosis: Secondary | ICD-10-CM | POA: Diagnosis not present

## 2020-07-04 DIAGNOSIS — D2261 Melanocytic nevi of right upper limb, including shoulder: Secondary | ICD-10-CM | POA: Diagnosis not present

## 2020-07-04 DIAGNOSIS — D485 Neoplasm of uncertain behavior of skin: Secondary | ICD-10-CM | POA: Diagnosis not present

## 2020-07-04 DIAGNOSIS — C44629 Squamous cell carcinoma of skin of left upper limb, including shoulder: Secondary | ICD-10-CM | POA: Diagnosis not present

## 2020-07-04 DIAGNOSIS — D2272 Melanocytic nevi of left lower limb, including hip: Secondary | ICD-10-CM | POA: Diagnosis not present

## 2020-07-04 DIAGNOSIS — Z85828 Personal history of other malignant neoplasm of skin: Secondary | ICD-10-CM | POA: Diagnosis not present

## 2020-07-04 DIAGNOSIS — X32XXXA Exposure to sunlight, initial encounter: Secondary | ICD-10-CM | POA: Diagnosis not present

## 2020-07-04 DIAGNOSIS — L821 Other seborrheic keratosis: Secondary | ICD-10-CM | POA: Diagnosis not present

## 2020-07-04 DIAGNOSIS — D225 Melanocytic nevi of trunk: Secondary | ICD-10-CM | POA: Diagnosis not present

## 2020-07-09 ENCOUNTER — Telehealth (INDEPENDENT_AMBULATORY_CARE_PROVIDER_SITE_OTHER): Payer: Medicare Other | Admitting: Internal Medicine

## 2020-07-09 ENCOUNTER — Encounter: Payer: Self-pay | Admitting: Internal Medicine

## 2020-07-09 ENCOUNTER — Ambulatory Visit: Payer: Medicare Other

## 2020-07-09 VITALS — Ht 65.0 in | Wt 147.0 lb

## 2020-07-09 DIAGNOSIS — M1711 Unilateral primary osteoarthritis, right knee: Secondary | ICD-10-CM

## 2020-07-09 DIAGNOSIS — R2689 Other abnormalities of gait and mobility: Secondary | ICD-10-CM | POA: Diagnosis not present

## 2020-07-09 DIAGNOSIS — M25562 Pain in left knee: Secondary | ICD-10-CM | POA: Diagnosis not present

## 2020-07-09 DIAGNOSIS — G8929 Other chronic pain: Secondary | ICD-10-CM | POA: Diagnosis not present

## 2020-07-09 DIAGNOSIS — I700 Atherosclerosis of aorta: Secondary | ICD-10-CM | POA: Diagnosis not present

## 2020-07-09 DIAGNOSIS — E119 Type 2 diabetes mellitus without complications: Secondary | ICD-10-CM

## 2020-07-09 DIAGNOSIS — M25561 Pain in right knee: Secondary | ICD-10-CM

## 2020-07-09 MED ORDER — GLUCOSE BLOOD VI STRP
ORAL_STRIP | 0 refills | Status: DC
Start: 2020-07-09 — End: 2020-08-06

## 2020-07-09 NOTE — Patient Instructions (Addendum)
  You can add up to 2000 mg of acetominophen (tylenol) every day safely  In divided doses (500 mg every 6 hours  Or 1000 mg every 12 hours.)   MRI right knee  Followed by [facility] referral to either [facility] or [facility]

## 2020-07-09 NOTE — Progress Notes (Unsigned)
Virtual Visit via [place]  This visit type was conducted due to national recommendations for restrictions regarding the COVID-19 pandemic (e.g. social distancing).  This format is felt to be most appropriate for this patient at this time.  All issues noted in this document were discussed and addressed.  No physical exam was performed (except for noted visual exam findings with Video Visits).   I connected with@ on 07/09/20 at  2:00 PM EST by a video enabled telemedicine application  and verified that I am speaking with the correct person using two identifiers. Location patient: home Location provider: work or home office Persons participating in the virtual visit: patient, provider  I discussed the limitations, risks, security and privacy concerns of performing an evaluation and management service by telephone and the availability of in person appointments. I also discussed with the patient that there may be a patient responsible charge related to this service. The patient expressed understanding and agreed to proceed.  Reason for visit: one month follow up on knee pain, leg weakness and frequent falls   HPI:  80 yr old female with recurrent falls secondary to chronic dizziness and proximal leg weakness aggravated by bilateral knee pain secondary to DJD.    During the last several months she has had I/A cortisone injections in both knees for relief of pain.  Currently, her right knee is problematic and she notes that the PT she has been receiving for strength is making her knee pain intolerable.  Previous radiographs reviewed with patient:  She has tri compartmental disease.  She has no recent or prior orthopedic evaluation.   She is taking 500 to 1000 mg tylenol daily during the day.  Her pain is present only with weight bearing so she does not use medication at night.  She denies symptoms of giving way and locking up. She has no history of trauma to the knee.  She has not been physically  active in several year except for walking .  Aortic atherosclerosis : Reviewed findings of prior CT scan today and Dr Donivan Scull recommendations to start statin..  Patient is willing to  Initiate statin therapy starting with 20 mg crestor twice weekly     ROS: See pertinent positives and negatives per HPI.  Past Medical History:  Diagnosis Date  . Anxiety   . Anxiety disorder   . Cervical dysplasia    a. 2010 - high grade squamous intraepithelial lesion s/p excision.  . Chest pain    a. 2006 or 2007 Cath [facility]): reportedly nl cors;  b. 09/2011 ETT: nl.  . GERD (gastroesophageal reflux disease)   . Herpes zoster   . Hypothyroidism   . Orthostatic hypotension   . Pre-diabetes   . RBBB   . Vertigo   . Wears dentures    partial lower    Past Surgical History:  Procedure Laterality Date  . AUGMENTATION MAMMAPLASTY Bilateral   . BREAST EXCISIONAL BIOPSY Left   . CARDIAC CATHETERIZATION  2003   Dr.Callwood, R/L heart cath,  . COLONOSCOPY WITH PROPOFOL N/A 06/12/2017   Procedure: COLONOSCOPY WITH PROPOFOL;  Surgeon: Lucilla Lame, MD;  Location: [place];  Service: Endoscopy;  Laterality: N/A;  . ESOPHAGEAL MANOMETRY N/A 06/19/2016   Procedure: ESOPHAGEAL MANOMETRY (EM);  Surgeon: Lucilla Lame, MD;  Location: [facility];  Service: Endoscopy;  Laterality: N/A;  . ESOPHAGOGASTRODUODENOSCOPY (EGD) WITH PROPOFOL N/A 04/03/2017   Procedure: ESOPHAGOGASTRODUODENOSCOPY (EGD) WITH PROPOFOL;  Surgeon: Lucilla Lame, MD;  Location: [place];  Service:  Endoscopy;  Laterality: N/A;  diabetic - diet controlled  . THYROIDECTOMY  05/15/15   [facility]    Family History  Problem Relation Age of Onset  . Heart attack Mother   . Alcohol abuse Mother   . Mental illness Mother        died in her 13's of alzheimer's Dementia  . Heart disease Mother 9  . Heart attack Father   . Heart disease Father 31       AMI - died @ 35.  . Colon cancer Paternal Grandmother 66    SOCIAL  HX:  reports that she has never smoked. She has never used smokeless tobacco. She reports current alcohol use. She reports that she does not use drugs.   Current Outpatient Medications:  .  ALPRAZolam (XANAX) 0.5 MG tablet, Take 1 tablet by mouth twice daily as needed for anxiety, Disp: 60 tablet, Rfl: 2 .  CARTIA XT 120 MG 24 hr capsule, TAKE 1 CAPSULE BY MOUTH  DAILY, Disp: 90 capsule, Rfl: 3 .  ezetimibe (ZETIA) 10 MG tablet, TAKE 1 TABLET BY MOUTH  DAILY, Disp: 90 tablet, Rfl: 3 .  furosemide (LASIX) 20 MG tablet, Take 1 tablet (20 mg total) by mouth daily as needed., Disp: 30 tablet, Rfl: 3 .  hyoscyamine (LEVSIN SL) 0.125 MG SL tablet, Place 1 tablet (0.125 mg total) under the tongue every 4 (four) hours as needed., Disp: 30 tablet, Rfl: 5 .  levothyroxine (SYNTHROID) 112 MCG tablet, TAKE 1 TABLET BY MOUTH  DAILY BEFORE BREAKFAST, Disp: 90 tablet, Rfl: 3 .  meclizine (ANTIVERT) 25 MG tablet, TAKE 1 TABLET BY MOUTH THREE TIMES DAILY AS NEEDED FOR VERTIGO, Disp: , Rfl:  .  nitroGLYCERIN (NITROSTAT) 0.4 MG SL tablet, DISSOLVE ONE TABLET UNDER THE TONGUE EVERY 5 MINUTES AS NEEDED FOR CHEST PAIN.  DO NOT EXCEED A TOTAL OF 3 DOSES IN 15 MINUTES, Disp: 20 tablet, Rfl: 3 .  omeprazole (PRILOSEC) 40 MG capsule, TAKE 1 CAPSULE BY MOUTH  TWICE DAILY, Disp: 180 capsule, Rfl: 3 .  rosuvastatin (CRESTOR) 20 MG tablet, Take 1 tablet (20 mg total) by mouth daily., Disp: 30 tablet, Rfl: 0 .  sertraline (ZOLOFT) 100 MG tablet, TAKE 2 TABLETS BY MOUTH  DAILY, Disp: 180 tablet, Rfl: 3 .  glucose blood test strip, For One touch Verio Flex .  Use to check 1 times daily e11.9, Disp: 100 each, Rfl: 0  EXAM:  VITALS per patient if applicable:  GENERAL: alert, oriented, appears well and in no acute distress  HEENT: atraumatic, conjunttiva clear, no obvious abnormalities on inspection of external nose and ears  NECK: normal movements of the head and neck  LUNGS: on inspection no signs of respiratory distress,  breathing rate appears normal, no obvious gross SOB, gasping or wheezing  CV: no obvious cyanosis  MS: moves all visible extremities without noticeable abnormality  PSYCH/NEURO: pleasant and cooperative, no obvious depression or anxiety, speech and thought processing grossly intact  ASSESSMENT AND PLAN:  Discussed the following assessment and plan:  Diabetes mellitus without complication ([place]) - Plan: Microalbumin / creatinine urine ratio  Chronic pain of right knee - Plan: MR Knee Right Wo Contrast, Ambulatory referral to Orthopedic Surgery  Primary osteoarthritis of right knee - Plan: MR Knee Right Wo Contrast, Ambulatory referral to Orthopedic Surgery  Balance problem  Bilateral chronic knee pain  Aortic atherosclerosis (HCC)  Balance problem She has been receiving outpatient PT for poor balance and proximal leg weakness but finds  that it is aggravating her right knee pain.  Referral to orthopedics following an MRI of  the knee.   Bilateral chronic knee pain Secondary to DJD.  currenlty her right knee is most problematic/symptomatic.  MRI knee and orthopedics referral in progress.   Aortic atherosclerosis ([place]) Reviewed the need for dual  therapy with ezitimibe and twice weekly crestor given documented evidence of moderate  atherosclerosis in the aorta on prior imaging studies .  She is tolerating zetia and crestor.     I discussed the assessment and treatment plan with the patient. The patient was provided an opportunity to ask questions and all were answered. The patient agreed with the plan and demonstrated an understanding of the instructions.   The patient was advised to call back or seek an in-person evaluation if the symptoms worsen or if the condition fails to improve as anticipated.  I provided  30 minutes of  face-to-face time during this encounter reviewing patient's current problems and past surgeries, labs and imaging studies, providing counseling on the above  mentioned problems , and coordination  of care .  Crecencio Mc, MD

## 2020-07-10 ENCOUNTER — Other Ambulatory Visit: Payer: Medicare Other

## 2020-07-10 DIAGNOSIS — I700 Atherosclerosis of aorta: Secondary | ICD-10-CM | POA: Insufficient documentation

## 2020-07-10 NOTE — Assessment & Plan Note (Addendum)
She has been receiving outpatient PT for poor balance and proximal leg weakness but finds that it is aggravating her right knee pain.  Referral to orthopedics following an MRI of  the knee.

## 2020-07-10 NOTE — Assessment & Plan Note (Signed)
Secondary to DJD.  currenlty her right knee is most problematic/symptomatic.  MRI knee and orthopedics referral in progress.

## 2020-07-10 NOTE — Assessment & Plan Note (Addendum)
Reviewed the need for dual  therapy with ezitimibe and twice weekly crestor given documented evidence of moderate  atherosclerosis in the aorta on prior imaging studies .  She is tolerating zetia and crestor.

## 2020-07-12 ENCOUNTER — Ambulatory Visit: Payer: Medicare Other

## 2020-07-16 ENCOUNTER — Ambulatory Visit: Payer: Medicare Other

## 2020-07-18 ENCOUNTER — Other Ambulatory Visit: Payer: Self-pay

## 2020-07-18 ENCOUNTER — Ambulatory Visit
Admission: RE | Admit: 2020-07-18 | Discharge: 2020-07-18 | Disposition: A | Discharge status: Home / Self Care | Payer: Medicare Other | Source: Ambulatory Visit | Attending: Internal Medicine | Admitting: Internal Medicine

## 2020-07-18 DIAGNOSIS — M25461 Effusion, right knee: Secondary | ICD-10-CM | POA: Diagnosis not present

## 2020-07-18 DIAGNOSIS — R600 Localized edema: Secondary | ICD-10-CM | POA: Diagnosis not present

## 2020-07-18 DIAGNOSIS — M25561 Pain in right knee: Secondary | ICD-10-CM | POA: Diagnosis not present

## 2020-07-18 DIAGNOSIS — G8929 Other chronic pain: Secondary | ICD-10-CM | POA: Insufficient documentation

## 2020-07-18 DIAGNOSIS — S83281A Other tear of lateral meniscus, current injury, right knee, initial encounter: Secondary | ICD-10-CM | POA: Diagnosis not present

## 2020-07-18 DIAGNOSIS — M1711 Unilateral primary osteoarthritis, right knee: Secondary | ICD-10-CM | POA: Diagnosis not present

## 2020-07-18 NOTE — Progress Notes (Signed)
Happy Birthday!    I hate to give you bad news on your birthday,  but your knee MRI shows that you have a BAD mensical tear and severe degenerative arthritis with loss of cartilage..  I tagree that it's time to see Dr Donney Rankins and consider either a partial knee replacement or total,  whatever he thinks  is best.  Regards,   Helene Kelp

## 2020-08-03 ENCOUNTER — Telehealth: Payer: Self-pay | Admitting: Internal Medicine

## 2020-08-03 DIAGNOSIS — E119 Type 2 diabetes mellitus without complications: Secondary | ICD-10-CM

## 2020-08-03 MED ORDER — BLOOD GLUCOSE MONITOR KIT: PACK | 0 refills | Status: DC

## 2020-08-03 NOTE — Telephone Encounter (Signed)
Called and spoke to [place]. Kelcey verbalized understanding and had no further questions.

## 2020-08-03 NOTE — Telephone Encounter (Signed)
Glucometer rx sent to [facility], please notify patient

## 2020-08-03 NOTE — Telephone Encounter (Signed)
Patient saw Dr. Derrel Nip a few weeks ago and Dr. Derrel Nip wrote a prescription her  test stripes. Her monitor is outdated and the company no longer makes test stripes for patient's monitor. Patient would like a prescription to get a new monitor. Please let patient know when prescription is sent to her pharmacy. Thanks

## 2020-08-06 ENCOUNTER — Other Ambulatory Visit: Payer: Self-pay

## 2020-08-06 MED ORDER — GLUCOSE BLOOD VI STRP
ORAL_STRIP | 3 refills | Status: DC
Start: 2020-08-06 — End: 2021-01-01

## 2020-08-09 DIAGNOSIS — D0462 Carcinoma in situ of skin of left upper limb, including shoulder: Secondary | ICD-10-CM | POA: Diagnosis not present

## 2020-08-09 DIAGNOSIS — C44629 Squamous cell carcinoma of skin of left upper limb, including shoulder: Secondary | ICD-10-CM | POA: Diagnosis not present

## 2020-08-17 ENCOUNTER — Ambulatory Visit: Payer: Medicare Other

## 2020-08-20 ENCOUNTER — Other Ambulatory Visit: Payer: Self-pay | Admitting: Internal Medicine

## 2020-08-20 DIAGNOSIS — Z48817 Encounter for surgical aftercare following surgery on the skin and subcutaneous tissue: Secondary | ICD-10-CM | POA: Diagnosis not present

## 2020-08-21 ENCOUNTER — Other Ambulatory Visit: Payer: Self-pay | Admitting: Internal Medicine

## 2020-08-21 MED ORDER — ROSUVASTATIN CALCIUM 20 MG PO TABS
20.0000 mg | ORAL_TABLET | ORAL | 3 refills | Status: DC
Start: 2020-10-08 — End: 2020-10-17

## 2020-08-22 DIAGNOSIS — M170 Bilateral primary osteoarthritis of knee: Secondary | ICD-10-CM | POA: Diagnosis not present

## 2020-09-14 ENCOUNTER — Other Ambulatory Visit: Payer: Self-pay | Admitting: Orthopedic Surgery

## 2020-09-14 MED ORDER — CLINDAMYCIN PHOSPHATE 600 MG/50ML IV SOLN
600.0000 mg | Freq: Once | INTRAVENOUS | Status: AC
  Filled 2020-09-14: qty 50

## 2020-09-14 MED ORDER — TRANEXAMIC ACID-NACL 1000-0.7 MG/100ML-% IV SOLN
1000.0000 mg | INTRAVENOUS | Status: AC
  Filled 2020-09-14: qty 100

## 2020-09-18 ENCOUNTER — Encounter: Payer: Self-pay | Admitting: Internal Medicine

## 2020-09-18 ENCOUNTER — Other Ambulatory Visit: Payer: Self-pay

## 2020-09-18 ENCOUNTER — Telehealth: Payer: Self-pay | Admitting: *Deleted

## 2020-09-18 ENCOUNTER — Telehealth: Payer: Self-pay | Admitting: Cardiovascular Disease

## 2020-09-18 ENCOUNTER — Ambulatory Visit (INDEPENDENT_AMBULATORY_CARE_PROVIDER_SITE_OTHER): Payer: Medicare Other | Admitting: Internal Medicine

## 2020-09-18 VITALS — BP 132/68 | HR 62 | Temp 97.5°F | Resp 16 | Ht 65.0 in | Wt 152.4 lb

## 2020-09-18 DIAGNOSIS — Z01818 Encounter for other preprocedural examination: Secondary | ICD-10-CM | POA: Diagnosis not present

## 2020-09-18 DIAGNOSIS — R600 Localized edema: Secondary | ICD-10-CM | POA: Diagnosis not present

## 2020-09-18 DIAGNOSIS — R0600 Dyspnea, unspecified: Secondary | ICD-10-CM

## 2020-09-18 DIAGNOSIS — Z01810 Encounter for preprocedural cardiovascular examination: Secondary | ICD-10-CM

## 2020-09-18 DIAGNOSIS — R0609 Other forms of dyspnea: Secondary | ICD-10-CM

## 2020-09-18 NOTE — Progress Notes (Signed)
Subjective:  Patient ID: Megan Bradley, female    DOB: 05-10-1941  Age: 80 y.o. MRN: [id number]  CC: The primary encounter diagnosis was Bilateral lower extremity edema. A diagnosis of Preoperative clearance was also pertinent to this visit.  HPI Megan Bradley presents for preoperative evaluation  This visit occurred during the SARS-CoV-2 public health emergency.  Safety protocols were in place, including screening questions prior to the visit, additional usage of staff PPE, and extensive cleaning of exam room while observing appropriate contact time as indicated for disinfecting solutions.   Megan Bradley is a 80 yr old female with well controlled type 2 DM,  Hyperlipidemia, and hypothyroidism who presents for preoperative medical clearance, requested by her orthopedist, for future right knee replacement She has a history of aortic atherosclerosis and atypical chest pain with a diagnosis of nutcracker esophagus.  She was recently referred to Dr Rockey Situ for evaluationof exertional dyspnea.  And was offered an ECHO but she deferred.  In January with an EKG was reviewed today and notable for a RBBB  She has DM Type 2 which has been historically well controlled  and not complicated by nephropathy , neuropathy , or PAD. Has been having more dyspnea,  And more lower extremity edema.  Not using furosemide   Multiple chronic complaints today:  Has a chronic paroxysmal cough attributed to aspiration/GERD Continues to pick at her atopic dermatitis , trying hard to stop.doesn't itch much,  Just a bad habit. Takes 200 mg sertraline   Bilateral earwax impaction Bilateral edema appearance of lymphedema   Going to [place] in september for 13 days with a tour    Outpatient Medications Prior to Visit  Medication Sig Dispense Refill  . ALPRAZolam (XANAX) 0.5 MG tablet Take 1 tablet by mouth twice daily as needed for anxiety (Patient taking differently: Take 0.5 mg by mouth 2 (two) times daily as needed for anxiety.) 60  tablet 2  . blood glucose meter kit and supplies KIT Dispense based on patient and insurance preference. Use up to four times daily as directed. 1 each 0  . CARTIA XT 120 MG 24 hr capsule TAKE 1 CAPSULE BY MOUTH  DAILY (Patient taking differently: Take 120 mg by mouth daily.) 90 capsule 3  . cholecalciferol (VITAMIN D3) 25 MCG (1000 UNIT) tablet Take 2,000 Units by mouth daily.    Marland Kitchen ezetimibe (ZETIA) 10 MG tablet TAKE 1 TABLET BY MOUTH  DAILY (Patient taking differently: Take 10 mg by mouth daily.) 90 tablet 3  . furosemide (LASIX) 20 MG tablet Take 1 tablet (20 mg total) by mouth daily as needed. (Patient taking differently: Take 20 mg by mouth daily as needed for edema.) 30 tablet 3  . glucose blood test strip For One touch Verio Flex .  Use to check 1 times daily e11.9 100 each 3  . hyoscyamine (LEVSIN SL) 0.125 MG SL tablet Place 1 tablet (0.125 mg total) under the tongue every 4 (four) hours as needed. (Patient taking differently: Place 0.125 mg under the tongue every 4 (four) hours as needed (spasms).) 30 tablet 5  . levothyroxine (SYNTHROID) 112 MCG tablet TAKE 1 TABLET BY MOUTH  DAILY BEFORE BREAKFAST (Patient taking differently: Take 112 mcg by mouth daily before breakfast. TAKE 1 TABLET BY MOUTH  DAILY BEFORE BREAKFAST) 90 tablet 3  . meclizine (ANTIVERT) 25 MG tablet Take 25 mg by mouth 3 (three) times daily as needed (vertigo).    Marland Kitchen omeprazole (PRILOSEC) 40 MG capsule TAKE 1 CAPSULE  BY MOUTH  TWICE DAILY (Patient taking differently: Take 40 mg by mouth in the morning and at bedtime.) 180 capsule 3  . [START ON 10/08/2020] rosuvastatin (CRESTOR) 20 MG tablet Take 1 tablet (20 mg total) by mouth every other day. 45 tablet 3  . sertraline (ZOLOFT) 100 MG tablet TAKE 2 TABLETS BY MOUTH  DAILY (Patient taking differently: Take 200 mg by mouth daily.) 180 tablet 3  . vitamin B-12 (CYANOCOBALAMIN) 1000 MCG tablet Take 1,000 mcg by mouth daily. Chewables    . nitroGLYCERIN (NITROSTAT) 0.4 MG SL  tablet DISSOLVE ONE TABLET UNDER THE TONGUE EVERY 5 MINUTES AS NEEDED FOR CHEST PAIN.  DO NOT EXCEED A TOTAL OF 3 DOSES IN 15 MINUTES (Patient not taking: No sig reported) 20 tablet 3   Facility-Administered Medications Prior to Visit  Medication Dose Route Frequency Provider Last Rate Last Admin  . clindamycin (CLEOCIN) IVPB 600 mg  600 mg Intravenous Once [place], MD        Review of Systems;  Patient denies headache, fevers, malaise, unintentional weight loss, skin rash, eye pain, sinus congestion and sinus pain, sore throat, dysphagia,  hemoptysis , cough, , wheezing,  palpitations, orthopnea, edema, abdominal pain, nausea, melena, diarrhea, constipation, flank pain, dysuria, hematuria, urinary  Frequency, nocturia, numbness, tingling, seizures,  Focal weakness, Loss of consciousness,  Tremor, insomnia, depression, anxiety, and suicidal ideation.      Objective:  BP 132/68 (BP Location: Left Arm, Patient Position: Sitting, Cuff Size: Normal)   Pulse 62   Temp (!) 97.5 F (36.4 C) (Oral)   Resp 16   Ht '5\' 5"'  (1.651 m)   Wt 152 lb 6.4 oz (69.1 kg)   SpO2 97%   BMI 25.36 kg/m   BP Readings from Last 3 Encounters:  09/18/20 132/68  06/20/20 124/66  06/06/20 128/62    Wt Readings from Last 3 Encounters:  09/18/20 152 lb 6.4 oz (69.1 kg)  07/09/20 147 lb (66.7 kg)  06/20/20 150 lb (68 kg)    General appearance: alert, cooperative and appears stated age Ears: normal TM's and external ear canals both ears Throat: lips, mucosa, and tongue normal; teeth and gums normal Neck: no adenopathy, no carotid bruit, supple, symmetrical, trachea midline and thyroid not enlarged, symmetric, no tenderness/mass/nodules Back: symmetric, no curvature. ROM normal. No CVA tenderness. Lungs: clear to auscultation bilaterally Heart: regular rate and rhythm, S1, S2 normal, no murmur, click, rub or gallop Abdomen: soft, non-tender; bowel sounds normal; no masses,  no organomegaly Pulses:  2+ and symmetric Skin: Skin color, texture, turgor normal. No rashes or lesions Lymph nodes: Cervical, supraclavicular, and axillary nodes normal. Ext:  Bilateral edema,  Skin texture waxy,  C/w lymphedema  Lab Results  Component Value Date   HGBA1C 6.8 (H) 05/14/2020   HGBA1C 6.7 (H) 10/18/2019   HGBA1C 7.1 (H) 03/11/2019    Lab Results  Component Value Date   CREATININE 1.09 05/14/2020   CREATININE 1.02 10/18/2019   CREATININE 0.95 03/11/2019    Lab Results  Component Value Date   WBC 5.7 05/14/2020   HGB 12.5 05/14/2020   HCT 37.8 05/14/2020   PLT 167.0 05/14/2020   GLUCOSE 116 (H) 05/14/2020   CHOL 191 05/14/2020   TRIG 56.0 05/14/2020   HDL 73.70 05/14/2020   LDLDIRECT 93.0 01/02/2016   LDLCALC 106 (H) 05/14/2020   ALT 22 05/14/2020   AST 21 05/14/2020   NA 143 05/14/2020   K 4.0 05/14/2020   CL 105 05/14/2020  CREATININE 1.09 05/14/2020   BUN 14 05/14/2020   CO2 30 05/14/2020   TSH 3.67 05/14/2020   INR 0.9 12/05/2011   HGBA1C 6.8 (H) 05/14/2020   MICROALBUR <0.7 03/11/2019    MR Knee Right Wo Contrast  Result Date: 07/18/2020 CLINICAL DATA:  Pain EXAM: MRI OF THE RIGHT KNEE WITHOUT CONTRAST TECHNIQUE: Multiplanar, multisequence MR imaging of the knee was performed. No intravenous contrast was administered. COMPARISON:  Right femur x-rays dated November. Right knee x-rays dated July FINDINGS: MENISCI Medial meniscus:  Intact. Lateral meniscus: Macerated anterior horn. Horizontal tear of the body and posterior horn. LIGAMENTS Cruciates:  Intact ACL and PCL. Collaterals: Medial collateral ligament is intact. Lateral collateral ligament complex is intact. CARTILAGE Patellofemoral: Full-thickness cartilage loss over the lateral patellar facet. High-grade partial-thickness cartilage loss over the patellar apex and lateral trochlea. Medial:  Diffuse cartilage thinning without focal defect. Lateral: Large areas of full-thickness cartilage loss over the central and  peripheral lateral femoral condyle and lateral tibial plateau with prominent subchondral marrow edema and developing cystic change. Joint:  Moderate joint effusion with synovitis.  Normal Hoffa's fat. Popliteal Fossa:  No Baker cyst. Intact popliteus tendon. Extensor Mechanism: Intact quadriceps tendon and patellar tendon. Intact medial and lateral patellar retinaculum. Intact MPFL. Bones: No acute fracture or dislocation. No suspicious bone lesion. Other: None. IMPRESSION: 1. Macerated anterior horn of the lateral meniscus. Additional horizontal tear of the body and posterior horn. 2. Tricompartmental osteoarthritis, moderate to severe in the lateral compartment. 3. Moderate joint effusion with synovitis. Electronically Signed   By: Titus Dubin M.D.   On: 07/18/2020 12:28    Assessment & Plan:   Problem List Items Addressed This Visit      Unprioritized   Bilateral lower extremity edema - Primary   Relevant Orders   Ambulatory referral to Vascular Surgery   Preoperative clearance    She has been having exertional dyspnea and bilateral LE edema. , both of which need evaluation prior to TKR  .  ECHO/cardiology evaluation and vascular eval for lymphedema ordered           I have discontinued Megan Bradley's nitroGLYCERIN. I am also having her maintain her meclizine, hyoscyamine, ezetimibe, ALPRAZolam, levothyroxine, sertraline, Cartia XT, omeprazole, furosemide, blood glucose meter kit and supplies, glucose blood, rosuvastatin, vitamin B-12, and cholecalciferol.  No orders of the defined types were placed in this encounter.   Medications Discontinued During This Encounter  Medication Reason  . nitroGLYCERIN (NITROSTAT) 0.4 MG SL tablet     Follow-up: No follow-ups on file.   Crecencio Mc, MD

## 2020-09-18 NOTE — Assessment & Plan Note (Signed)
She has been having exertional dyspnea and bilateral LE edema. , both of which need evaluation prior to TKR  .  ECHO/cardiology evaluation and vascular eval for lymphedema ordered

## 2020-09-18 NOTE — Addendum Note (Signed)
Addended by: Crecencio Mc on: 09/18/2020 04:18 PM   Modules accepted: Orders

## 2020-09-18 NOTE — Telephone Encounter (Signed)
S/w [facility] and informed them pt has informed our office she is having surgery with Dr. Dione Housekeeper. I called [facility] requested clearance request to be faxed to our office [phone].

## 2020-09-18 NOTE — Telephone Encounter (Signed)
Noted     Closing encounter

## 2020-09-18 NOTE — Telephone Encounter (Signed)
Spoke with patient echo scheduled tomorrow and eval with NP on 4/20

## 2020-09-18 NOTE — Telephone Encounter (Signed)
Thank you Both!  ECHO ordered

## 2020-09-18 NOTE — Telephone Encounter (Signed)
Patient calling to have an echo.  She was told by pcp it was needs asap for surgical clearance.   No order in epic.    Please advise.     Cc Dr. Derrel Nip also to help expedite order if needed .

## 2020-09-18 NOTE — Patient Instructions (Addendum)
I recommend seeing Dr Rockey Situ ASAP for a cardiac evaluation including an ECHO PRIOR to your knee replacement  I am also making a referral to [facility] to rule out lymphedema as a cause of your fluid retention,because this condition may be  aggravated by knee replacement   Front office will schedule an RN visit for ear cleaning    I recommend taking the furosemide every other day

## 2020-09-18 NOTE — Telephone Encounter (Signed)
She needs CARDIAC CLEARANCE,  Not just an ECHO.  Saw Gollan in Jan. Told her to request appt with Rockey Situ  I will order the ECHO if you will schedule her a follow up with Gollan to discuss

## 2020-09-18 NOTE — Telephone Encounter (Signed)
   [facility] Pre-operative Risk Assessment    Patient Name: Megan Bradley  DOB: 08-20-1940  MRN: [id number]   HEARTCARE STAFF: - Please ensure there is not already an duplicate clearance open for this procedure. - Under Visit Info/Reason for Call, type in Other and utilize the format Clearance MM/DD/YY or Clearance TBD. Do not use dashes or single digits. - If request is for dental extraction, please clarify the # of teeth to be extracted.  Request for surgical clearance:  1. What type of surgery is being performed? RIGHT TKA   2. When is this surgery scheduled? 10/02/20   3. What type of clearance is required (medical clearance vs. Pharmacy clearance to hold med vs. Both)? MEDICAL  4. Are there any medications that need to be held prior to surgery and how long? NONE LISTED   5. Practice name and name of physician performing surgery? [facility]; DR. Dione Housekeeper   6. What is the office phone number? [phone]   7.   What is the office fax number? [phone]  8.   Anesthesia type (None, local, MAC, general) ? CHOICE    Julaine Hua 09/18/2020, 5:46 PM  _________________________________________________________________   (provider comments below)

## 2020-09-19 ENCOUNTER — Ambulatory Visit (INDEPENDENT_AMBULATORY_CARE_PROVIDER_SITE_OTHER): Payer: Medicare Other

## 2020-09-19 DIAGNOSIS — H6123 Impacted cerumen, bilateral: Secondary | ICD-10-CM

## 2020-09-19 DIAGNOSIS — R0600 Dyspnea, unspecified: Secondary | ICD-10-CM | POA: Diagnosis not present

## 2020-09-19 DIAGNOSIS — R600 Localized edema: Secondary | ICD-10-CM

## 2020-09-19 DIAGNOSIS — Z01810 Encounter for preprocedural cardiovascular examination: Secondary | ICD-10-CM | POA: Diagnosis not present

## 2020-09-19 DIAGNOSIS — R0609 Other forms of dyspnea: Secondary | ICD-10-CM

## 2020-09-19 LAB — ECHOCARDIOGRAM COMPLETE
AR max vel: 2.45 cm2
AV Area VTI: 2.26 cm2
AV Area mean vel: 2.25 cm2
AV Mean grad: 9 mmHg
AV Peak grad: 19.5 mmHg
Ao pk vel: 2.21 m/s
Area-P 1/2: 2.39 cm2
Calc EF: 51.1 %
P 1/2 time: 294 msec
S' Lateral: 2.7 cm
Single Plane A2C EF: 51 %
Single Plane A4C EF: 53.5 %

## 2020-09-19 NOTE — Progress Notes (Signed)
Patient presented for ear irrigation due to cerumen impaction. Patient was informed of the possible side effects of having their ear flushed; light headedness, dizziness, nausea, vomiting and rupture ear drum. Items used or that can be used are the elephant pump, catch basin, ear curettes, hydrogen peroxide (half a bottle for ear irrigation solution), and a stool softener (1-2CC for softening ear wax).  Patient has given a verbal consent to have ear irrigation. patient voiced no concerns nor showed any signs of distress during procedure. Ear canal has become visibly clear.

## 2020-09-20 NOTE — Telephone Encounter (Signed)
LVM to call back.

## 2020-09-21 NOTE — Telephone Encounter (Signed)
Pt has appt 09/26/20 with Laurann Montana, NP for pre op clearance. Will forward notes to NP for upcoming appt.

## 2020-09-24 ENCOUNTER — Other Ambulatory Visit: Payer: Medicare Other

## 2020-09-26 ENCOUNTER — Ambulatory Visit: Payer: Medicare Other | Admitting: Family

## 2020-09-26 ENCOUNTER — Ambulatory Visit: Payer: Medicare Other | Admitting: Urgent Care

## 2020-09-26 ENCOUNTER — Encounter: Payer: Self-pay | Admitting: Family

## 2020-09-26 ENCOUNTER — Other Ambulatory Visit: Payer: Self-pay

## 2020-09-26 ENCOUNTER — Ambulatory Visit
Admission: RE | Admit: 2020-09-26 | Discharge: 2020-09-26 | Disposition: A | Discharge status: Home / Self Care | Payer: Medicare Other | Source: Ambulatory Visit | Attending: Orthopedic Surgery | Admitting: Orthopedic Surgery

## 2020-09-26 ENCOUNTER — Other Ambulatory Visit
Admission: RE | Admit: 2020-09-26 | Discharge: 2020-09-26 | Disposition: A | Discharge status: Home / Self Care | Payer: Medicare Other | Source: Ambulatory Visit | Attending: Orthopedic Surgery | Admitting: Orthopedic Surgery

## 2020-09-26 VITALS — BP 96/58 | HR 60 | Ht 65.0 in | Wt 150.0 lb

## 2020-09-26 DIAGNOSIS — I452 Bifascicular block: Secondary | ICD-10-CM

## 2020-09-26 DIAGNOSIS — Z01810 Encounter for preprocedural cardiovascular examination: Secondary | ICD-10-CM | POA: Diagnosis not present

## 2020-09-26 DIAGNOSIS — I700 Atherosclerosis of aorta: Secondary | ICD-10-CM

## 2020-09-26 DIAGNOSIS — Z01811 Encounter for preprocedural respiratory examination: Secondary | ICD-10-CM | POA: Insufficient documentation

## 2020-09-26 DIAGNOSIS — I5032 Chronic diastolic (congestive) heart failure: Secondary | ICD-10-CM

## 2020-09-26 DIAGNOSIS — Z01818 Encounter for other preprocedural examination: Secondary | ICD-10-CM | POA: Diagnosis not present

## 2020-09-26 DIAGNOSIS — E782 Mixed hyperlipidemia: Secondary | ICD-10-CM

## 2020-09-26 HISTORY — DX: Sleep apnea, unspecified: G47.30

## 2020-09-26 HISTORY — DX: Family history of other specified conditions: Z84.89

## 2020-09-26 HISTORY — DX: Unspecified osteoarthritis, unspecified site: M19.90

## 2020-09-26 HISTORY — DX: Pneumonia, unspecified organism: J18.9

## 2020-09-26 LAB — CBC WITH DIFFERENTIAL/PLATELET
Abs Immature Granulocytes: 0.02 10*3/uL (ref 0.00–0.07)
Basophils Absolute: 0 10*3/uL (ref 0.0–0.1)
Basophils Relative: 1 %
Eosinophils Absolute: 0.1 10*3/uL (ref 0.0–0.5)
Eosinophils Relative: 2 %
HCT: 34.7 % — ABNORMAL LOW (ref 36.0–46.0)
Hemoglobin: 11.4 g/dL — ABNORMAL LOW (ref 12.0–15.0)
Immature Granulocytes: 0 %
Lymphocytes Relative: 21 %
Lymphs Abs: 1.4 10*3/uL (ref 0.7–4.0)
MCH: 30.1 pg (ref 26.0–34.0)
MCHC: 32.9 g/dL (ref 30.0–36.0)
MCV: 91.6 fL (ref 80.0–100.0)
Monocytes Absolute: 0.4 10*3/uL (ref 0.1–1.0)
Monocytes Relative: 6 %
Neutro Abs: 4.6 10*3/uL (ref 1.7–7.7)
Neutrophils Relative %: 70 %
Platelets: 150 10*3/uL (ref 150–400)
RBC: 3.79 MIL/uL — ABNORMAL LOW (ref 3.87–5.11)
RDW: 16.3 % — ABNORMAL HIGH (ref 11.5–15.5)
WBC: 6.6 10*3/uL (ref 4.0–10.5)
nRBC: 0 % (ref 0.0–0.2)

## 2020-09-26 LAB — URINALYSIS, COMPLETE (UACMP) WITH MICROSCOPIC
Bilirubin Urine: NEGATIVE
Glucose, UA: NEGATIVE mg/dL
Hgb urine dipstick: NEGATIVE
Ketones, ur: NEGATIVE mg/dL
Nitrite: NEGATIVE
Protein, ur: NEGATIVE mg/dL
Specific Gravity, Urine: 1.019 (ref 1.005–1.030)
pH: 6 (ref 5.0–8.0)

## 2020-09-26 LAB — TYPE AND SCREEN
ABO/RH(D): A POS
Antibody Screen: NEGATIVE

## 2020-09-26 LAB — PROTIME-INR
INR: 1 (ref 0.8–1.2)
Prothrombin Time: 13.4 seconds (ref 11.4–15.2)

## 2020-09-26 LAB — SURGICAL PCR SCREEN
MRSA, PCR: NEGATIVE
Staphylococcus aureus: NEGATIVE

## 2020-09-26 LAB — APTT: aPTT: 28 seconds (ref 24–36)

## 2020-09-26 LAB — BASIC METABOLIC PANEL
Anion gap: 10 (ref 5–15)
BUN: 19 mg/dL (ref 8–23)
CO2: 27 mmol/L (ref 22–32)
Calcium: 9.4 mg/dL (ref 8.9–10.3)
Chloride: 104 mmol/L (ref 98–111)
Creatinine, Ser: 0.89 mg/dL (ref 0.44–1.00)
GFR, Estimated: >60 mL/min (ref >60)
Glucose, Bld: 101 mg/dL — ABNORMAL HIGH (ref 70–99)
Potassium: 3.7 mmol/L (ref 3.5–5.1)
Sodium: 141 mmol/L (ref 135–145)

## 2020-09-26 LAB — HEMOGLOBIN A1C
Hgb A1c MFr Bld: 6.9 % — ABNORMAL HIGH (ref 4.8–5.6)
Mean Plasma Glucose: 151.33 mg/dL

## 2020-09-26 MED ORDER — FUROSEMIDE 20 MG PO TABS: 20.0000 mg | ORAL_TABLET | Freq: Every day | ORAL | 5 refills | Status: DC

## 2020-09-26 NOTE — Progress Notes (Signed)
Office Visit    Patient Name: Megan Bradley Date of Encounter: 09/26/2020  PCP:  Crecencio Mc, MD   [place]  Cardiologist:  Ida Rogue, MD  Advanced Practice Provider:  No care team member to display Electrophysiologist:  None   Chief Complaint    Megan Bradley is a 80 y.o. female with a hx of syncope, RBBB, chest pain thought to be due to nutcracker esophagus with cardiac catheterization 2007 with no coronary disease, diastolic heart failure, thyroidectomy, aortic atherosclerosis, HTN, HLD, DM2 presents today for preoperative cardiovascular clearance for right TKA.   Past Medical History    Past Medical History:  Diagnosis Date  . Anxiety   . Anxiety disorder   . Arthritis    both knees  . Cervical dysplasia    a. 2010 - high grade squamous intraepithelial lesion s/p excision.  . Chest pain    a. 2006 or 2007 Cath [facility]): reportedly nl cors;  b. 09/2011 ETT: nl.  . Family history of adverse reaction to anesthesia    grand daughter was hard to wake up after back surgery  . GERD (gastroesophageal reflux disease)   . Herpes zoster   . Hypothyroidism   . Orthostatic hypotension   . Pneumonia   . Pre-diabetes   . RBBB    RBBB  . Sleep apnea    does not use cpap, did not tolerate  . Vertigo   . Wears dentures    partial lower   Past Surgical History:  Procedure Laterality Date  . AUGMENTATION MAMMAPLASTY Bilateral   . BREAST EXCISIONAL BIOPSY Left   . CARDIAC CATHETERIZATION  2003   Dr.Callwood, R/L heart cath,  . COLONOSCOPY WITH PROPOFOL N/A 06/12/2017   Procedure: COLONOSCOPY WITH PROPOFOL;  Surgeon: Lucilla Lame, MD;  Location: [facility];  Service: Endoscopy;  Laterality: N/A;  . dental work    . ESOPHAGEAL MANOMETRY N/A 06/19/2016   Procedure: ESOPHAGEAL MANOMETRY (EM);  Surgeon: Lucilla Lame, MD;  Location: [facility];  Service: Endoscopy;  Laterality: N/A;  . ESOPHAGOGASTRODUODENOSCOPY (EGD) WITH PROPOFOL N/A  04/03/2017   Procedure: ESOPHAGOGASTRODUODENOSCOPY (EGD) WITH PROPOFOL;  Surgeon: Lucilla Lame, MD;  Location: [facility];  Service: Endoscopy;  Laterality: N/A;  diabetic - diet controlled  . THYROIDECTOMY  05/15/15   [facility]  . TONSILLECTOMY      Allergies  Allergies  Allergen Reactions  . Atorvastatin Other (See Comments)    Muscle cramps and weakness  . Codeine Nausea And Vomiting  . Fentanyl Nausea And Vomiting  . Lipitor [Atorvastatin Calcium]     Muscle cramps and weakness  . Tetracyclines & Related Hives    History of Present Illness    Megan Bradley is a 80 y.o. female with a hx of syncope, RBBB, chest pain thought to be due to nutcracker esophagus with cardiac catheterization 2007 with no coronary disease, diastolic heart failure, thyroidectomy, aortic atherosclerosis, HTN, HLD, DM2. She was last seen 06/20/20 by Dr. Rockey Situ.  Previous history of syncope which improved by holding Lisinopril and HCTZ. Previous cardiac workup for chest pain includes normal cardiac catheterization in 2007 and normal exercise treadmill test in 2013.   She was seen by Dr. Rockey Situ 06/20/20 noting dyspnea on exertion which she attributed to deconditioning. Known prior history of diastolic dysfunction. Echocardiogram and stress testing were discussed but she declined. She was seen by PCP for preoperative clearance and recommended for echocardiogram and to follow up with cardiology. She was  also referred to vascular to rule out lymphedema. Underwent echocardiogram 09/19/20 with LVEF 60-65%, no RWMA, gr2DD, RV normal size and function, mild AI, RA pressure 33mHg.   She presents today for cardiovascular clearance for right TKA. We reviewed her echocardiogram in detail. BP at home routinely 110/70. Denies lightheadedness and dizziness. No chest pain, pressure, tightness. Endorses stable dyspnea on exertion. Notes mild lower extremity edema and tells me tomorrow she has an appointment with vascular. She did  just get back from a trip to the beach and endorses eating more salt than usual. She drinks <2 liters of fluid per day.   EKGs/Labs/Other Studies Reviewed:   The following studies were reviewed today:  Echo 09/19/20 1. Left ventricular ejection fraction, by estimation, is 60 to 65%. The  left ventricle has normal function. The left ventricle has no regional  wall motion abnormalities. Left ventricular diastolic parameters are  consistent with Grade II diastolic  dysfunction (pseudonormalization).   2. Right ventricular systolic function is normal. The right ventricular  size is normal.   3. The mitral valve is normal in structure. No evidence of mitral valve  regurgitation.   4. The aortic valve is tricuspid. Aortic valve regurgitation is mild.   5. The inferior vena cava is dilated in size with <50% respiratory  variability, suggesting right atrial pressure of 15 mmHg    EKG:  No EKG is  ordered today.  The ekg independently reviewed from 09/26/20 demonstrated SR 62 bpm with RBBB and LAFB - known bifasicular block dating back to 2012. Stable TWI V1, V2. No acute ST/T wave changes.   Recent Labs: 10/18/2019: Magnesium 1.9 05/14/2020: ALT 22; BUN 14; Creatinine, Ser 1.09; Hemoglobin 12.5; Platelets 167.0; Potassium 4.0; Sodium 143; TSH 3.67  Recent Lipid Panel    Component Value Date/Time   CHOL 191 05/14/2020 0803   TRIG 56.0 05/14/2020 0803   HDL 73.70 05/14/2020 0803   CHOLHDL 3 05/14/2020 0803   VLDL 11.2 05/14/2020 0803   LDLCALC 106 (H) 05/14/2020 0803   LDLDIRECT 93.0 01/02/2016 1434    Home Medications   Current Meds  Medication Sig  . ALPRAZolam (XANAX) 0.5 MG tablet Take 0.5 mg by mouth 2 (two) times daily as needed for anxiety.  . blood glucose meter kit and supplies KIT Dispense based on patient and insurance preference. Use up to four times daily as directed.  . diltiazem (CARDIZEM CD) 120 MG 24 hr capsule Take 120 mg by mouth daily.  .Marland Kitchenezetimibe (ZETIA) 10 MG  tablet Take 10 mg by mouth daily.  . furosemide (LASIX) 20 MG tablet Take 20 mg by mouth as needed.  .Marland Kitchenglucose blood test strip For One touch Verio Flex .  Use to check 1 times daily e11.9  . hyoscyamine (LEVSIN SL) 0.125 MG SL tablet Place 0.125 mg under the tongue every 4 (four) hours as needed (spasms).  . meclizine (ANTIVERT) 25 MG tablet Take 25 mg by mouth 3 (three) times daily as needed (vertigo).  .Marland Kitchenomeprazole (PRILOSEC) 40 MG capsule Take 40 mg by mouth daily.  .Derrill MemoON 10/08/2020] rosuvastatin (CRESTOR) 20 MG tablet Take 1 tablet (20 mg total) by mouth every other day.  . sertraline (ZOLOFT) 100 MG tablet Take 200 mg by mouth daily.     Review of Systems  All other systems reviewed and are otherwise negative except as noted above.  Physical Exam    VS:  BP (!) 96/58   Pulse 60   Ht 5'  5" (1.651 m)   Wt 150 lb (68 kg)   BMI 24.96 kg/m  , BMI Body mass index is 24.96 kg/m.  Wt Readings from Last 3 Encounters:  09/26/20 150 lb (68 kg)  09/26/20 150 lb 9.2 oz (68.3 kg)  09/18/20 152 lb 6.4 oz (69.1 kg)    GEN: Well nourished, well developed, in no acute distress. HEENT: normal. Neck: Supple, no JVD, carotid bruits, or masses. Cardiac: RRR, no murmurs, rubs, or gallops. No clubbing, cyanosis. Trace pretibial edema.  Radials/PT 2+ and equal bilaterally.  Respiratory:  Respirations regular and unlabored, clear to auscultation bilaterally. GI: Soft, nontender, nondistended. MS: No deformity or atrophy. Skin: Warm and dry, no rash. Neuro:  Strength and sensation are intact. Psych: Normal affect.  Assessment & Plan    1. Preoperative cardiovascular examination - Upcoming right TKA 10/02/20. According to the Revised Cardiac Risk Index (RCRI), her Perioperative Risk of Major Cardiac Event is (%): 0.9. Her Functional Capacity in METs is: 5.62 according to the [facility] Activity Status Index (DASI). Echo 09/19/20 normal LVEF, gr2DD, no RWMA, no significant valvular abnormalities. EKG  performed in pre-op today showed NSR with stable bifascicular block which dates back to 2012 and no acute ST/T wave changes. Per AHA/ACC guidelines she is deemed acceptable risk for the planned procedure without additional cardiovascular testing. Will forward to requesting surgeon via [facility] fax function.   2. Diastolic heart failure - Echo 09/2020 normal LVEF, gr2DD, no RWMA, no significant valvular abnormalities. NYHA II with dyspnea, fatigue. Start Lasix 54m daily. BMP in 1-2 weeks. Low salt diet and <2L fluid restriction discussed.   3. Aortic atherosclerosis - EKG no acute ST/T wave changes. GDMT includes CCB, statin, Zetia. No indication for ischemic evaluation at this time.   4. HLD - 05/2020 LDL 106. Continue to follow with PCP. LDL goal <100, ideally <70 in setting of aortic atherosclerosis. Continue Rosuvastatin 254mdaily, Zetia 1015maily. Lifestyle changes encouraged.   5. DM2 - Continue to follow with PCP.   6. RBBB / LAFB - History of RBBB and LAFB dating back to 2012.   Disposition: Follow up in 2 month(s) with Dr. GolRockey Situ APP   Signed, CaiLoel DubonnetP 09/26/2020, 11:33 AM [place]

## 2020-09-26 NOTE — Patient Instructions (Addendum)
Your procedure is scheduled on: 10/02/20 - Tuesday Report to the Registration Desk on the 1st floor of the [facility]. To find out your arrival time, please call [phone] between 1PM - 3PM on: 10/01/20 - Monday  Report to [facility] on 09/28/20 between 8 am and 2 pm for Covid Testing .  REMEMBER: Instructions that are not followed completely may result in serious medical risk, up to and including death; or upon the discretion of your surgeon and anesthesiologist your surgery may need to be rescheduled.  Do not eat food or drink any fluids after midnight the night before surgery.  No gum chewing, lozengers or hard candies.   TAKE THESE MEDICATIONS THE MORNING OF SURGERY WITH A SIP OF WATER:  - CARTIA XT 120 MG 24 hr capsule - levothyroxine (SYNTHROID) 112 MCG tablet - omeprazole (PRILOSEC) 40 MG capsule  One week prior to surgery: Stop Anti-inflammatories (NSAIDS) such as Advil, Aleve, Ibuprofen, Motrin, Naproxen, Naprosyn and Aspirin based products such as Excedrin, Goodys Powder, BC Powder.  Stop ANY OVER THE COUNTER supplements until after surgery.  No Alcohol for 24 hours before or after surgery.  No Smoking including e-cigarettes for 24 hours prior to surgery.  No chewable tobacco products for at least 6 hours prior to surgery.  No nicotine patches on the day of surgery.  Do not use any "recreational" drugs for at least a week prior to your surgery.  Please be advised that the combination of cocaine and anesthesia may have negative outcomes, up to and including death. If you test positive for cocaine, your surgery will be cancelled.  On the morning of surgery brush your teeth with toothpaste and water, you may rinse your mouth with mouthwash if you wish. Do not swallow any toothpaste or mouthwash.  Do not wear jewelry, make-up, hairpins, clips or nail polish.  Do not wear lotions, powders, or perfumes.   Do not shave body from the neck down 48 hours prior to  surgery just in case you cut yourself which could leave a site for infection.  Also, freshly shaved skin may become irritated if using the CHG soap.  Contact lenses, hearing aids and dentures may not be worn into surgery.  Do not bring valuables to the hospital. [facility] is not responsible for any missing/lost belongings or valuables.   Use CHG Soap or wipes as directed on instruction sheet.  Notify your doctor if there is any change in your medical condition (cold, fever, infection).  Wear comfortable clothing (specific to your surgery type) to the hospital.  Plan for stool softeners for home use; pain medications have a tendency to cause constipation. You can also help prevent constipation by eating foods high in fiber such as fruits and vegetables and drinking plenty of fluids as your diet allows.  After surgery, you can help prevent lung complications by doing breathing exercises.  Take deep breaths and cough every 1-2 hours. Your doctor may order a device called an Incentive Spirometer to help you take deep breaths. When coughing or sneezing, hold a pillow firmly against your incision with both hands. This is called "splinting." Doing this helps protect your incision. It also decreases belly discomfort.  If you are being admitted to the hospital overnight, leave your suitcase in the car. After surgery it may be brought to your room.  If you are being discharged the day of surgery, you will not be allowed to drive home. You will need a responsible adult (18 years  or older) to drive you home and stay with you that night.   If you are taking public transportation, you will need to have a responsible adult (18 years or older) with you. Please confirm with your physician that it is acceptable to use public transportation.   Please call the [facility]. at [phone] if you have any questions about these instructions.  Surgery Visitation Policy:  Patients  undergoing a surgery or procedure may have one family member or support person with them as long as that person is not COVID-19 positive or experiencing its symptoms.  That person may remain in the waiting area during the procedure.  Inpatient Visitation:    Visiting hours are 7 a.m. to 8 p.m. Inpatients will be allowed two visitors daily. The visitors may change each day during the patient's stay. No visitors under the age of 41. Any visitor under the age of 52 must be accompanied by an adult. The visitor must pass COVID-19 screenings, use hand sanitizer when entering and exiting the patient's room and wear a mask at all times, including in the patient's room. Patients must also wear a mask when staff or their visitor are in the room. Masking is required regardless of vaccination status.

## 2020-09-26 NOTE — Patient Instructions (Addendum)
Medication Instructions:  Your physician has recommended you make the following change in your medication:   CHANGE Furosemide (Lasix) to 20mg  daily  *If you need a refill on your cardiac medications before your next appointment, please call your pharmacy*   Lab Work: Your physician recommends that you return for lab work in 1-2 weeks at the [facility] for [place] [facility] 1st desk on the right to check in, past the screening table Lab hours: Monday- Friday (7:30 am- 5:30 pm)  If you have labs (blood work) drawn today and your tests are completely normal, you will receive your results only by: Marland Kitchen MyChart Message (if you have MyChart) OR . A paper copy in the mail If you have any lab test that is abnormal or we need to change your treatment, we will call you to review the results.   Testing/Procedures: Your echocardiogram showed normal heart pumping function but that your heart is moderately stiff. This is called diastolic dysfunction or diastolic heart failure. The new fluid pill, Furosemide (Lasix), will help to get rid of the excess fluid and prevent fluid from accumulating.   Follow-Up: At [facility], you and your health needs are our priority.  As part of our continuing mission to provide you with exceptional heart care, we have created designated Provider Care Teams.  These Care Teams include your primary Cardiologist (physician) and Advanced Practice Providers (APPs -  Physician Assistants and Nurse Practitioners) who all work together to provide you with the care you need, when you need it.  We recommend signing up for the patient portal called "MyChart".  Sign up information is provided on this After Visit Summary.  MyChart is used to connect with patients for Virtual Visits (Telemedicine).  Patients are able to view lab/test results, encounter notes, upcoming appointments, etc.  Non-urgent messages can be sent to your provider as well.   To learn more about  what you can do with MyChart, go to [email or web address].    Your next appointment:   2 month(s)  The format for your next appointment:   In Person  Provider:   You may see Ida Rogue, MD or one of the following Advanced Practice Providers on your designated Care Team:    Murray Hodgkins, NP  Christell Faith, PA-C  Marrianne Mood, PA-C  Cadence [place], [place]  Laurann Montana, NP  Other Instructions  Loel Dubonnet, NP will send a note to your surgeon that you are cleared for surgery.   Recommend drinking less than 2L of fluid per day.   Heart Healthy Diet Recommendations: A low-salt diet is recommended. Meats should be grilled, baked, or boiled. Avoid fried foods. Focus on lean protein sources like fish or chicken with vegetables and fruits. The American Heart Association is a Microbiologist!  American Heart Association Diet and Lifeystyle Recommendations

## 2020-09-27 ENCOUNTER — Ambulatory Visit (INDEPENDENT_AMBULATORY_CARE_PROVIDER_SITE_OTHER): Payer: Medicare Other | Admitting: Nurse Practitioner

## 2020-09-27 ENCOUNTER — Encounter: Payer: Self-pay | Admitting: Orthopedic Surgery

## 2020-09-27 VITALS — BP 131/75 | HR 67 | Ht 65.0 in | Wt 149.0 lb

## 2020-09-27 DIAGNOSIS — E119 Type 2 diabetes mellitus without complications: Secondary | ICD-10-CM

## 2020-09-27 DIAGNOSIS — R600 Localized edema: Secondary | ICD-10-CM

## 2020-09-27 DIAGNOSIS — E785 Hyperlipidemia, unspecified: Secondary | ICD-10-CM

## 2020-09-27 DIAGNOSIS — M7989 Other specified soft tissue disorders: Secondary | ICD-10-CM

## 2020-09-27 NOTE — Progress Notes (Signed)
Perioperative Services  Pre-Admission/Anesthesia Testing Clinical Review  Date: 09/28/20  Patient Demographics:  Name: Megan Bradley DOB:   1941/02/20 MRN:   [id number]  Planned Surgical Procedure(s):    Case: [id number] Date/Time: 10/02/20 0730   Procedure: RIGHT TOTAL KNEE ARTHROPLASTY (Right Knee)   Anesthesia type: Choice   Pre-op diagnosis: right knee osteoarthritis   Location: [facility] OR ROOM 02 / [facility] [facility] FOR ANEST[facility]IA [facility]   Surgeons: Thornton Park, MD    NOTE: Available PAT nursing documentation and vital signs have been reviewed. Clinical nursing staff has updated patient's PMH/PSHx, current medication list, and drug allergies/intolerances to ensure comprehensive history available to assist in medical decision making as it pertains to the aforementioned surgical procedure and anticipated anesthetic course.   Clinical Discussion:  Megan Bradley is a 80 y.o. female who is submitted for pre-surgical anesthesia review and clearance prior to her undergoing the above procedure. Patient has never been a smoker. Pertinent PMH includes: angina, RBBB, aortic atherosclerosis, aortic regurgitation, diastolic heart failure (M7EH), pre-diabetes, DOE, OSAH (cannot tolerate nocturnal PAP), GERD (on daily PPI), nutcracker esophagus,  hypothyroidism, OA, anxiety (on BZO).   Patient is followed by cardiology Rockey Situ, MD). She was last seen in the cardiology clinic on 09/26/2020; notes reviewed.  At the time of her clinic visit, patient reported to have stable exertional dyspnea and lower extremity edema.  Patient was referred for preoperative clearance prior to the surgery.  Exertional dyspnea felt to be related to deconditioning.  Lower extremity edema felt to possibly be related to lymphedema.  Patient was also referred to vascular for further evaluation.  Patient underwent a remote cardiac catheterization for further evaluation of angina (2007) that revealed normal coronary anatomy with no significant  obstructive disease.  Reported chest pain at that time was felt to be secondary to patient's known nutcracker esophagus.  TTE performed on 09/19/2020 revealed a normal left ventricular systolic function with an EF of 60 to 65%, no evidence of significant valvular insufficiency, and grade II diastolic dysfunction.  Blood pressure low in the clinic at 96/58.  Patient on GDMT for her HLD diagnosis; takes CCB, statin, and ezetimibe.  Patient with prediabetes diagnosis; last hemoglobin A1c was 6.9% on 09/26/2020.  ECG revealed sinus rhythm at a rate of 62 bpm with stable bifascicular block and TWIs noted in leads V1 and V2.Functional capacity, as defined by DASI, is documented as being 5.62 METS.  Given patient's recent echo results, she was started on daily furosemide.  No other changes were made to her medication regimen.  Patient to follow-up with outpatient cardiology in 2 months or sooner if needed.  Patient was seen in consult by vascular surgery Owens Shark, NP-C) on 09/27/2020 at the request of her PCP; notes pending. Spoke with evaluating provider directly and was advised that there while lymphedema is a consideration, this should not impact her proceeding with planned knee surgery.   Patient is scheduled for an elective orthopedic procedure on 10/02/2020 with Dr. Thornton Park.  Given patient's past medical history significant for cardiovascular diagnoses, presurgical cardiac and vascular clearances were sought by the performing surgeon's office and PAT team.  Specialty clearances were obtained as follows:   Per vascular surgery, "this patient is optimized for surgery and may proceed with the planned procedural course with a LOW risk stratification".    Per cardiology, "RCRI indicates a 0.9% risk of MACE.recent echo revealed normal LVEF, G2DD, no RWMA's, and no significant valvular abnormalities.  EKG stable.  Therefore, based on  patient's past medical history and time since his last clinic visit,  patient would be at an overall ACCEPTABLE risk for the planned procedure without further cardiovascular testing or intervention at this time".  This patient is currently not on any type of anticoagulation/antiplatelet therapies.  Patient denies previous perioperative complications with anesthesia in the past. In review of the available records, it is noted that patient underwent a general anesthetic course at [facility] in 06/2017 without documented complications.   Vitals with BMI 09/27/2020 09/26/2020 09/26/2020  Height '5\' 5"'  '5\' 5"'  -  Weight 149 lbs 150 lbs -  BMI 36.14 43.15 -  Systolic 400 96 867  Diastolic 75 58 71  Pulse 67 60 66    Providers/Specialists:   NOTE: Primary physician provider listed below. Patient may have been seen by APP or partner within same practice.   PROVIDER ROLE / SPECIALTY LAST Larey Seat, MD  Orthopedics (Surgeon)  08/22/2020  Crecencio Mc, MD  Primary Care Provider  09/18/2020  Ida Rogue, MD  Cardiology  09/26/2020  Eulogio Ditch, NP-C  Vascular Surgery  09/27/2020   Allergies:  Atorvastatin, Codeine, Fentanyl, Lipitor [atorvastatin calcium], and Tetracyclines & related  Current Home Medications:   No current facility-administered medications for this encounter.   . meclizine (ANTIVERT) 25 MG tablet  . [START ON 10/08/2020] rosuvastatin (CRESTOR) 20 MG tablet  . ALPRAZolam (XANAX) 0.5 MG tablet  . blood glucose meter kit and supplies KIT  . diltiazem (CARDIZEM CD) 120 MG 24 hr capsule  . ezetimibe (ZETIA) 10 MG tablet  . furosemide (LASIX) 20 MG tablet  . glucose blood test strip  . hyoscyamine (LEVSIN SL) 0.125 MG SL tablet  . omeprazole (PRILOSEC) 40 MG capsule  . sertraline (ZOLOFT) 100 MG tablet   . clindamycin (CLEOCIN) IVPB 600 mg   History:   Past Medical History:  Diagnosis Date  . Anxiety   . Anxiety disorder   . Aortic atherosclerosis ([place])   . Arthritis    both knees  . Cervical dysplasia     a. 2010 - high grade squamous intraepithelial lesion s/p excision.  . Chest pain    a. 2006 or 2007 [facility]): reportedly nl cors;  b. 09/2011 ETT: nl.  . DOE (dyspnea on exertion)   . Family history of adverse reaction to anesthesia    grand daughter was hard to wake up after back surgery  . GERD (gastroesophageal reflux disease)   . Grade II diastolic dysfunction   . Herpes zoster   . Hypothyroidism   . Mild aortic regurgitation   . Nutcracker esophagus   . Orthostatic hypotension   . Pneumonia   . Pre-diabetes   . Sleep apnea    does not use cpap, did not tolerate  . Vertigo   . Wears dentures    partial lower   Past Surgical History:  Procedure Laterality Date  . AUGMENTATION MAMMAPLASTY Bilateral   . BREAST EXCISIONAL BIOPSY Left   . CARDIAC CATHETERIZATION  2003   Dr.Callwood, R/L heart cath,  . COLONOSCOPY WITH PROPOFOL N/A 06/12/2017   Procedure: COLONOSCOPY WITH PROPOFOL;  Surgeon: Lucilla Lame, MD;  Location: [facility];  Service: Endoscopy;  Laterality: N/A;  . dental work    . ESOPHAGEAL MANOMETRY N/A 06/19/2016   Procedure: ESOPHAGEAL MANOMETRY (EM);  Surgeon: Lucilla Lame, MD;  Location: [facility] ENDOSCOPY;  Service: Endoscopy;  Laterality: N/A;  . ESOPHAGOGASTRODUODENOSCOPY (EGD) WITH PROPOFOL N/A 04/03/2017   Procedure: ESOPHAGOGASTRODUODENOSCOPY (  EGD) WITH PROPOFOL;  Surgeon: Lucilla Lame, MD;  Location: [facility];  Service: Endoscopy;  Laterality: N/A;  diabetic - diet controlled  . THYROIDECTOMY  05/15/15   [facility]  . TONSILLECTOMY     Family History  Problem Relation Age of Onset  . Heart attack Mother   . Alcohol abuse Mother   . Mental illness Mother        died in her 60's of alzheimer's Dementia  . Heart disease Mother 23  . Heart attack Father   . Heart disease Father 1       AMI - died @ 63.  . Colon cancer Paternal Grandmother 44   Social History   Tobacco Use  . Smoking status: Never Smoker  . Smokeless tobacco: Never  Used  Vaping Use  . Vaping Use: Never used  Substance Use Topics  . Alcohol use: Yes    Comment: wine once a week  . Drug use: No    Pertinent Clinical Results:  LABS: Labs reviewed: Acceptable for surgery.  Hospital Outpatient Visit on 09/26/2020  Component Date Value Ref Range Status  . WBC 09/26/2020 6.6  4.0 - 10.5 K/uL Final  . RBC 09/26/2020 3.79* 3.87 - 5.11 MIL/uL Final  . Hemoglobin 09/26/2020 11.4* 12.0 - 15.0 g/dL Final  . HCT 09/26/2020 34.7* 36.0 - 46.0 % Final  . MCV 09/26/2020 91.6  80.0 - 100.0 fL Final  . MCH 09/26/2020 30.1  26.0 - 34.0 pg Final  . MCHC 09/26/2020 32.9  30.0 - 36.0 g/dL Final  . RDW 09/26/2020 16.3* 11.5 - 15.5 % Final  . Platelets 09/26/2020 150  150 - 400 K/uL Final  . nRBC 09/26/2020 0.0  0.0 - 0.2 % Final  . Neutrophils Relative % 09/26/2020 70  % Final  . Neutro Abs 09/26/2020 4.6  1.7 - 7.7 K/uL Final  . Lymphocytes Relative 09/26/2020 21  % Final  . Lymphs Abs 09/26/2020 1.4  0.7 - 4.0 K/uL Final  . Monocytes Relative 09/26/2020 6  % Final  . Monocytes Absolute 09/26/2020 0.4  0.1 - 1.0 K/uL Final  . Eosinophils Relative 09/26/2020 2  % Final  . Eosinophils Absolute 09/26/2020 0.1  0.0 - 0.5 K/uL Final  . Basophils Relative 09/26/2020 1  % Final  . Basophils Absolute 09/26/2020 0.0  0.0 - 0.1 K/uL Final  . Immature Granulocytes 09/26/2020 0  % Final  . Abs Immature Granulocytes 09/26/2020 0.02  0.00 - 0.07 K/uL Final   Performed at [facility], [address] [place], [place] [address]  . Sodium 09/26/2020 141  135 - 145 mmol/L Final  . Potassium 09/26/2020 3.7  3.5 - 5.1 mmol/L Final  . Chloride 09/26/2020 104  98 - 111 mmol/L Final  . CO2 09/26/2020 27  22 - 32 mmol/L Final  . Glucose, Bld 09/26/2020 101* 70 - 99 mg/dL Final   Glucose reference range applies only to samples taken after fasting for at least 8 hours.  . BUN 09/26/2020 19  8 - 23 mg/dL Final  . Creatinine, Ser 09/26/2020 0.89  0.44 - 1.00 mg/dL Final  .  Calcium 09/26/2020 9.4  8.9 - 10.3 mg/dL Final  . GFR, Estimated 09/26/2020 >60  >60 mL/min Final   Comment: (NOTE) Calculated using the CKD-EPI Creatinine Equation (2021)   . Anion gap 09/26/2020 10  5 - 15 Final   Performed at [facility], [address] [place], [place] [address]  . Prothrombin Time 09/26/2020 13.4  11.4 - 15.2 seconds Final  . INR 09/26/2020 1.0  0.8 - 1.2 Final   Comment: (NOTE) INR goal varies based on device and disease states. Performed at [facility], [address], [place], [place] [address]   . aPTT 09/26/2020 28  24 - 36 seconds Final   Performed at [facility], [address] [place], [place] [address]  . Hgb A1c MFr Bld 09/26/2020 6.9* 4.8 - 5.6 % Final   Comment: (NOTE) Pre diabetes:          5.7%-6.4%  Diabetes:              >6.4%  Glycemic control for   <7.0% adults with diabetes   . Mean Plasma Glucose 09/26/2020 151.33  mg/dL Final   Performed at [place] [address] [place], [place] [address]  . ABO/RH(D) 09/26/2020 A POS   Final  . Antibody Screen 09/26/2020 NEG   Final  . Sample Expiration 09/26/2020 10/10/2020,2359   Final  . Extend sample reason 09/26/2020    Final                   Value:NO TRANSFUSIONS OR PREGNANCY IN THE PAST 3 MONTHS Performed at [facility], [address] [place], [place] [address]   . Color, Urine 09/26/2020 YELLOW* YELLOW Final  . APPearance 09/26/2020 CLEAR* CLEAR Final  . Specific Gravity, Urine 09/26/2020 1.019  1.005 - 1.030 Final  . pH 09/26/2020 6.0  5.0 - 8.0 Final  . Glucose, UA 09/26/2020 NEGATIVE  NEGATIVE mg/dL Final  . Hgb urine dipstick 09/26/2020 NEGATIVE  NEGATIVE Final  . Bilirubin Urine 09/26/2020 NEGATIVE  NEGATIVE Final  . Ketones, ur 09/26/2020 NEGATIVE  NEGATIVE mg/dL Final  . Protein, ur 09/26/2020 NEGATIVE  NEGATIVE mg/dL Final  . Nitrite 09/26/2020 NEGATIVE  NEGATIVE Final  . Chalmers Guest 09/26/2020 SMALL* NEGATIVE Final   . RBC / HPF 09/26/2020 0-5  0 - 5 RBC/hpf Final  . WBC, UA 09/26/2020 0-5  0 - 5 WBC/hpf Final  . Bacteria, UA 09/26/2020 RARE* NONE SEEN Final  . Squamous Epithelial / LPF 09/26/2020 0-5  0 - 5 Final  . Mucus 09/26/2020 PRESENT   Final   Performed at [facility], [address] [place], [place] [address]  . MRSA, PCR 09/26/2020 NEGATIVE  NEGATIVE Final  . Staphylococcus aureus 09/26/2020 NEGATIVE  NEGATIVE Final   Comment: (NOTE) The Xpert SA Assay (FDA approved for NASAL specimens in patients 47 years of age and older), is one component of a comprehensive surveillance program. It is not intended to diagnose infection nor to guide or monitor treatment. Performed at [facility], [address] [place], [place] [address]     ECG: Date: 09/26/2020 Time ECG obtained: 1047 AM Rate: 62 bpm Rhythm: NSR; RBBB; LAFB Axis (leads I and aVF): Normal Intervals: PR 178 ms. QRS 118 ms. QTc 470 ms. ST segment and T wave changes: No evidence of acute ST segment elevation or depression Comparison: Similar to previous tracing obtained on 06/20/2020   IMAGING / PROCEDURES: DIAGNOSTIC RADIOGRAPHS CHEST 2 VIEW performed on 09/26/2020 1. Lung volumes normal.   2. No consolidative airspace disease.   3. No pleural effusion.   4. No pneumothorax.   5. No pulmonary nodule or mass noted.   6. Pulmonary vasculature and cardiomediastinal silhouette are within normal limits.   7. No radiographic evidence of acute cardiopulmonary disease.  ECHOCARDIOGRAM performed on 09/19/2020 1. Left ventricular ejection  fraction, by estimation, is 60 to 65%.  2. The left ventricle has normal function.  3. The left ventricle has no regional wall motion abnormalities.  4. Left ventricular diastolic parameters are consistent with Grade II diastolic dysfunction (pseudonormalization).  5. Right ventricular systolic function is normal.  6. The right ventricular size is normal.  7. The mitral  valve is normal in structure. No evidence of mitral valve regurgitation.  8. The aortic valve is tricuspid. Aortic valve regurgitation is mild.  9. The inferior vena cava is dilated in size with <50% respiratory variability, suggesting right atrial pressure of 15 mmHg.   MRI KNEE RIGHT WITHOUT CONTRAST performed in 07/18/2020 1. Macerated anterior horn of the lateral meniscus 2. Additional horizontal tear of the body and posterior horn 3. Tricompartmental osteoarthritis; moderate to severe in the lateral compartment 4. Moderate joint effusion with synovitis  Impression and Plan:  Megan Bradley has been referred for pre-anesthesia review and clearance prior to her undergoing the planned anesthetic and procedural courses. Available labs, pertinent testing, and imaging results were personally reviewed by me. This patient has been appropriately cleared by cardiology (ACCEPTABLE) and vascular surgery (LOW) with the noted risk of significant perioperative complications.  Based on clinical review performed today (09/28/20), barring any significant acute changes in the patient's overall condition, it is anticipated that she will be able to proceed with the planned surgical intervention. Any acute changes in clinical condition may necessitate her procedure being postponed and/or cancelled. Patient will meet with anesthesia team (MD and/or CRNA) on this day of her procedure for preoperative evaluation/assessment.   Pre-surgical instructions were reviewed with the patient during her PAT appointment and questions were fielded by PAT clinical staff. Patient was advised that if any questions or concerns arise prior to her procedure then she should return a call to PAT and/or her surgeon's office to discuss.  Honor Loh, MSN, APRN, FNP-C, CEN [facility]  Peri-operative Services Nurse Practitioner Phone: [phone] 09/28/20 3:32 PM  NOTE: This note has been prepared using Dragon  dictation software. Despite my best ability to proofread, there is always the potential that unintentional transcriptional errors may still occur from this process.

## 2020-09-28 ENCOUNTER — Other Ambulatory Visit
Admission: RE | Admit: 2020-09-28 | Discharge: 2020-09-28 | Disposition: A | Discharge status: Home / Self Care | Payer: Medicare Other | Source: Ambulatory Visit | Attending: Orthopedic Surgery | Admitting: Orthopedic Surgery

## 2020-09-28 ENCOUNTER — Other Ambulatory Visit: Payer: Self-pay

## 2020-09-28 DIAGNOSIS — Z01812 Encounter for preprocedural laboratory examination: Secondary | ICD-10-CM | POA: Diagnosis not present

## 2020-09-28 DIAGNOSIS — Z20822 Contact with and (suspected) exposure to covid-19: Secondary | ICD-10-CM | POA: Diagnosis not present

## 2020-09-28 LAB — SARS CORONAVIRUS 2 (TAT 6-24 HRS): SARS Coronavirus 2: NEGATIVE

## 2020-09-30 ENCOUNTER — Encounter (INDEPENDENT_AMBULATORY_CARE_PROVIDER_SITE_OTHER): Payer: Self-pay | Admitting: Nurse Practitioner

## 2020-09-30 NOTE — Progress Notes (Signed)
Subjective:    Patient ID: Megan Bradley, female    DOB: 08-Aug-1940, 80 y.o.   MRN: [id number] Chief Complaint  Patient presents with  . New Patient (Initial Visit)    [place]. Bil LE edema    Megan Bradley is a 80 year old female that presents today for evaluation of lower extremity edema as referred by her primary care physician Dr. Derrel Nip.  The patient has upcoming right knee surgery.  There is concern that her leg swelling may be related to lymphedema.  The patient also has several noticeable varicosities bilaterally.  She also has atopic dermatitis with several small wounds due to picking and irritation.  She denies any fever or chills.  She denies any weeping from the lower extremities.  She was restarted on Lasix but has not noticed any great difference.  Recent echocardiogram revealed no systolic dysfunction but mild diastolic dysfunction.  Normal EF.  The patient has worn compression socks but not consistently.  She denies any fever or chills.  She does elevate her lower extremities.   Review of Systems  Respiratory: Positive for shortness of breath (w/exertion).   Cardiovascular: Positive for leg swelling.  Skin: Positive for wound.  All other systems reviewed and are negative.      Objective:   Physical Exam Vitals reviewed.  HENT:     Head: Normocephalic.  Cardiovascular:     Rate and Rhythm: Normal rate.     Pulses:          Dorsalis pedis pulses are 1+ on the right side and 1+ on the left side.       Posterior tibial pulses are 1+ on the right side and 1+ on the left side.  Pulmonary:     Effort: Pulmonary effort is normal.  Musculoskeletal:     Right lower leg: 1+ Edema present.     Left lower leg: 1+ Edema present.  Neurological:     Mental Status: She is alert and oriented to person, place, and time.  Psychiatric:        Mood and Affect: Mood normal.        Behavior: Behavior normal.        Thought Content: Thought content normal.        Judgment: Judgment normal.      BP 131/75   Pulse 67   Ht '5\' 5"'  (1.651 m)   Wt 149 lb (67.6 kg)   BMI 24.79 kg/m   Past Medical History:  Diagnosis Date  . Anxiety   . Anxiety disorder   . Aortic atherosclerosis ([place])   . Arthritis    both knees  . Cervical dysplasia    a. 2010 - high grade squamous intraepithelial lesion s/p excision.  . Chest pain    a. 2006 or 2007 Cath [facility]): reportedly nl cors;  b. 09/2011 ETT: nl.  . DOE (dyspnea on exertion)   . Family history of adverse reaction to anesthesia    grand daughter was hard to wake up after back surgery  . GERD (gastroesophageal reflux disease)   . Grade II diastolic dysfunction   . Herpes zoster   . Hypothyroidism   . Mild aortic regurgitation   . Nutcracker esophagus   . Orthostatic hypotension   . Pneumonia   . Pre-diabetes   . Sleep apnea    does not use cpap, did not tolerate  . Vertigo   . Wears dentures    partial lower    Social History  Socioeconomic History  . Marital status: Married    Spouse name: [name]  . Number of children: Not on file  . Years of education: Not on file  . Highest education level: Not on file  Occupational History  . Not on file  Tobacco Use  . Smoking status: Never Smoker  . Smokeless tobacco: Never Used  Vaping Use  . Vaping Use: Never used  Substance and Sexual Activity  . Alcohol use: Yes    Comment: wine once a week  . Drug use: No  . Sexual activity: Never  Other Topics Concern  . Not on file  Social History Narrative   Lives in [place].  Active but doesn't exercise. Retired from [employer].   Social Determinants of Health   Financial Resource Strain: Not on file  Food Insecurity: Not on file  Transportation Needs: Not on file  Physical Activity: Not on file  Stress: Not on file  Social Connections: Not on file  Intimate Partner Violence: Not on file    Past Surgical History:  Procedure Laterality Date  . AUGMENTATION MAMMAPLASTY Bilateral   . BREAST EXCISIONAL BIOPSY Left    . CARDIAC CATHETERIZATION  2003   Dr.Callwood, R/L heart cath,  . COLONOSCOPY WITH PROPOFOL N/A 06/12/2017   Procedure: COLONOSCOPY WITH PROPOFOL;  Surgeon: Lucilla Lame, MD;  Location: [place];  Service: Endoscopy;  Laterality: N/A;  . dental work    . ESOPHAGEAL MANOMETRY N/A 06/19/2016   Procedure: ESOPHAGEAL MANOMETRY (EM);  Surgeon: Lucilla Lame, MD;  Location: [facility] ENDOSCOPY;  Service: Endoscopy;  Laterality: N/A;  . ESOPHAGOGASTRODUODENOSCOPY (EGD) WITH PROPOFOL N/A 04/03/2017   Procedure: ESOPHAGOGASTRODUODENOSCOPY (EGD) WITH PROPOFOL;  Surgeon: Lucilla Lame, MD;  Location: [place];  Service: Endoscopy;  Laterality: N/A;  diabetic - diet controlled  . THYROIDECTOMY  05/15/15   [facility]  . TONSILLECTOMY      Family History  Problem Relation Age of Onset  . Heart attack Mother   . Alcohol abuse Mother   . Mental illness Mother        died in her 54's of alzheimer's Dementia  . Heart disease Mother 73  . Heart attack Father   . Heart disease Father 63       AMI - died @ 86.  . Colon cancer Paternal Grandmother 10    Allergies  Allergen Reactions  . Atorvastatin Other (See Comments)    Muscle cramps and weakness  . Codeine Nausea And Vomiting  . Fentanyl Nausea And Vomiting  . Lipitor [Atorvastatin Calcium]     Muscle cramps and weakness  . Tetracyclines & Related Hives    CBC Latest Ref Rng & Units 09/26/2020 05/14/2020 03/11/2019  WBC 4.0 - 10.5 K/uL 6.6 5.7 7.2  Hemoglobin 12.0 - 15.0 g/dL 11.4(L) 12.5 13.0  Hematocrit 36.0 - 46.0 % 34.7(L) 37.8 39.0  Platelets 150 - 400 K/uL 150 167.0 172.0      CMP     Component Value Date/Time   NA 141 09/26/2020 1039   NA 140 10/22/2013 1330   K 3.7 09/26/2020 1039   K 3.8 10/22/2013 1330   CL 104 09/26/2020 1039   CL 106 10/22/2013 1330   CO2 27 09/26/2020 1039   CO2 29 10/22/2013 1330   GLUCOSE 101 (H) 09/26/2020 1039   GLUCOSE 107 (H) 10/22/2013 1330   BUN 19 09/26/2020 1039   BUN 20 (H)  10/22/2013 1330   CREATININE 0.89 09/26/2020 1039   CREATININE 1.00 (H) 03/02/2015 1610  CALCIUM 9.4 09/26/2020 1039   CALCIUM 8.6 10/22/2013 1330   PROT 6.8 05/14/2020 0803   PROT 6.6 10/22/2013 1330   ALBUMIN 3.9 05/14/2020 0803   ALBUMIN 3.3 (L) 10/22/2013 1330   AST 21 05/14/2020 0803   AST 21 10/22/2013 1330   ALT 22 05/14/2020 0803   ALT 22 10/22/2013 1330   ALKPHOS 69 05/14/2020 0803   ALKPHOS 56 10/22/2013 1330   BILITOT 0.5 05/14/2020 0803   BILITOT 0.4 10/22/2013 1330   GFRNONAA >60 09/26/2020 1039   GFRNONAA 55 (L) 10/22/2013 1330   GFRAA >60 03/24/2017 1328   GFRAA >60 10/22/2013 1330     No results found.     Assessment & Plan:   1. Leg swelling There is concern that the patient's lower extremity edema is related to lymphedema, and they are concerned that upcoming knee surgery will make this worse.  It is certainly possible that her swelling is related to chronic venous insufficiency as well.  Given the severe pain and debilitating symptoms she has from her knee, delaying the surgery for possible treatment of lymphedema is not in her best interest.  Lymphedema is typically a chronic condition that is managed by the use of medical grade 1 compression stockings daily elevation and exercise.  The patient is advised to use compression stockings posterior surgery and we will plan on following up shortly thereafter with noninvasive studies to help further evaluate causes of lower extremity edema. - VAS [place] LOWER EXTREMITY VENOUS REFLUX; Future  2. Hyperlipidemia, unspecified hyperlipidemia type Continue statin as ordered and reviewed, no changes at this time   3. Diabetes mellitus without complication ([place]) Continue hypoglycemic medications as already ordered, these medications have been reviewed and there are no changes at this time.  Hgb A1C to be monitored as already arranged by primary service    Current Outpatient Medications on File Prior to Visit  Medication  Sig Dispense Refill  . ALPRAZolam (XANAX) 0.5 MG tablet Take 0.5 mg by mouth 2 (two) times daily as needed for anxiety.    . blood glucose meter kit and supplies KIT Dispense based on patient and insurance preference. Use up to four times daily as directed. 1 each 0  . diltiazem (CARDIZEM CD) 120 MG 24 hr capsule Take 120 mg by mouth daily.    Marland Kitchen ezetimibe (ZETIA) 10 MG tablet Take 10 mg by mouth daily.    . furosemide (LASIX) 20 MG tablet Take 1 tablet (20 mg total) by mouth daily. 30 tablet 5  . glucose blood test strip For One touch Verio Flex .  Use to check 1 times daily e11.9 100 each 3  . hyoscyamine (LEVSIN SL) 0.125 MG SL tablet Place 0.125 mg under the tongue every 4 (four) hours as needed (spasms).    . meclizine (ANTIVERT) 25 MG tablet Take 25 mg by mouth 3 (three) times daily as needed (vertigo).    Marland Kitchen omeprazole (PRILOSEC) 40 MG capsule Take 40 mg by mouth daily.    Derrill Memo ON 10/08/2020] rosuvastatin (CRESTOR) 20 MG tablet Take 1 tablet (20 mg total) by mouth every other day. 45 tablet 3  . sertraline (ZOLOFT) 100 MG tablet Take 200 mg by mouth daily.     Current Facility-Administered Medications on File Prior to Visit  Medication Dose Route Frequency Provider Last Rate Last Admin  . clindamycin (CLEOCIN) IVPB 600 mg  600 mg Intravenous Once [place], MD        There are no Patient  Instructions on file for this visit. No follow-ups on file.   Kris Hartmann, NP

## 2020-10-01 MED ORDER — CHLORHEXIDINE GLUCONATE CLOTH 2 % EX PADS
6.0000 | MEDICATED_PAD | Freq: Once | CUTANEOUS | Status: AC
  Administered 2020-10-02: 6 via TOPICAL

## 2020-10-01 MED ORDER — SODIUM CHLORIDE 0.9 % IV SOLN: INTRAVENOUS | Status: DC

## 2020-10-01 MED ORDER — CELECOXIB 200 MG PO CAPS: 200.0000 mg | ORAL_CAPSULE | ORAL | Status: AC

## 2020-10-01 MED ORDER — CEFAZOLIN SODIUM-DEXTROSE 2-4 GM/100ML-% IV SOLN: 2.0000 g | INTRAVENOUS | Status: DC

## 2020-10-01 MED ORDER — ACETAMINOPHEN 500 MG PO TABS: 1000.0000 mg | ORAL_TABLET | ORAL | Status: AC

## 2020-10-01 MED ORDER — ORAL CARE MOUTH RINSE: 15.0000 mL | Freq: Once | OROMUCOSAL | Status: AC

## 2020-10-01 MED ORDER — CHLORHEXIDINE GLUCONATE 0.12 % MT SOLN: 15.0000 mL | Freq: Once | OROMUCOSAL | Status: AC

## 2020-10-02 ENCOUNTER — Other Ambulatory Visit: Payer: Self-pay

## 2020-10-02 ENCOUNTER — Encounter: Payer: Self-pay | Admitting: Orthopedic Surgery

## 2020-10-02 ENCOUNTER — Encounter
Admission: RE | Disposition: A | Discharge status: Home / Self Care | Payer: Self-pay | Source: Ambulatory Visit | Attending: Orthopedic Surgery

## 2020-10-02 ENCOUNTER — Ambulatory Visit
Admission: RE | Admit: 2020-10-02 | Discharge: 2020-10-02 | Disposition: A | Discharge status: Home / Self Care | Payer: Medicare Other | Source: Ambulatory Visit | Attending: Orthopedic Surgery | Admitting: Orthopedic Surgery

## 2020-10-02 DIAGNOSIS — Z5309 Procedure and treatment not carried out because of other contraindication: Secondary | ICD-10-CM | POA: Diagnosis not present

## 2020-10-02 DIAGNOSIS — Z79899 Other long term (current) drug therapy: Secondary | ICD-10-CM | POA: Diagnosis not present

## 2020-10-02 DIAGNOSIS — K219 Gastro-esophageal reflux disease without esophagitis: Secondary | ICD-10-CM | POA: Insufficient documentation

## 2020-10-02 DIAGNOSIS — Z881 Allergy status to other antibiotic agents status: Secondary | ICD-10-CM | POA: Insufficient documentation

## 2020-10-02 DIAGNOSIS — L259 Unspecified contact dermatitis, unspecified cause: Secondary | ICD-10-CM | POA: Insufficient documentation

## 2020-10-02 DIAGNOSIS — I5032 Chronic diastolic (congestive) heart failure: Secondary | ICD-10-CM | POA: Diagnosis not present

## 2020-10-02 DIAGNOSIS — Z888 Allergy status to other drugs, medicaments and biological substances status: Secondary | ICD-10-CM | POA: Insufficient documentation

## 2020-10-02 DIAGNOSIS — M1711 Unilateral primary osteoarthritis, right knee: Secondary | ICD-10-CM | POA: Insufficient documentation

## 2020-10-02 DIAGNOSIS — I700 Atherosclerosis of aorta: Secondary | ICD-10-CM | POA: Insufficient documentation

## 2020-10-02 DIAGNOSIS — Z885 Allergy status to narcotic agent status: Secondary | ICD-10-CM | POA: Insufficient documentation

## 2020-10-02 DIAGNOSIS — F419 Anxiety disorder, unspecified: Secondary | ICD-10-CM | POA: Insufficient documentation

## 2020-10-02 HISTORY — DX: Atherosclerosis of aorta: I70.0

## 2020-10-02 HISTORY — DX: Nonrheumatic aortic (valve) insufficiency: I35.1

## 2020-10-02 HISTORY — DX: Dyspnea, unspecified: R06.00

## 2020-10-02 HISTORY — DX: Dyskinesia of esophagus: K22.4

## 2020-10-02 HISTORY — DX: Other ill-defined heart diseases: I51.89

## 2020-10-02 HISTORY — DX: Other forms of dyspnea: R06.09

## 2020-10-02 LAB — ABO/RH: ABO/RH(D): A POS

## 2020-10-02 LAB — GLUCOSE, CAPILLARY: Glucose-Capillary: 116 mg/dL — ABNORMAL HIGH (ref 70–99)

## 2020-10-02 SURGERY — ARTHROPLASTY, KNEE, TOTAL
Anesthesia: Spinal

## 2020-10-02 MED ORDER — BUPIVACAINE LIPOSOME 1.3 % IJ SUSP
INTRAMUSCULAR | Status: AC
  Filled 2020-10-02: qty 20

## 2020-10-02 MED ORDER — TRANEXAMIC ACID 1000 MG/10ML IV SOLN
INTRAVENOUS | Status: AC
  Filled 2020-10-02: qty 10

## 2020-10-02 MED ORDER — CELECOXIB 200 MG PO CAPS
ORAL_CAPSULE | ORAL | Status: AC
  Administered 2020-10-02: 200 mg via ORAL
  Filled 2020-10-02: qty 1

## 2020-10-02 MED ORDER — BUPIVACAINE-EPINEPHRINE (PF) 0.25% -1:200000 IJ SOLN
INTRAMUSCULAR | Status: AC
  Filled 2020-10-02: qty 30

## 2020-10-02 MED ORDER — CLINDAMYCIN PHOSPHATE 600 MG/50ML IV SOLN
INTRAVENOUS | Status: AC
  Filled 2020-10-02: qty 50

## 2020-10-02 MED ORDER — ACETAMINOPHEN 500 MG PO TABS
ORAL_TABLET | ORAL | Status: AC
  Administered 2020-10-02: 1000 mg via ORAL
  Filled 2020-10-02: qty 2

## 2020-10-02 MED ORDER — FENTANYL CITRATE (PF) 100 MCG/2ML IJ SOLN
INTRAMUSCULAR | Status: AC
  Filled 2020-10-02: qty 2

## 2020-10-02 MED ORDER — CEFAZOLIN SODIUM-DEXTROSE 2-4 GM/100ML-% IV SOLN
INTRAVENOUS | Status: AC
  Filled 2020-10-02: qty 100

## 2020-10-02 MED ORDER — CHLORHEXIDINE GLUCONATE 0.12 % MT SOLN
OROMUCOSAL | Status: AC
  Administered 2020-10-02: 15 mL via OROMUCOSAL
  Filled 2020-10-02: qty 15

## 2020-10-02 MED ORDER — MORPHINE SULFATE (PF) 4 MG/ML IV SOLN
INTRAVENOUS | Status: AC
  Filled 2020-10-02: qty 1

## 2020-10-02 MED ORDER — SODIUM CHLORIDE FLUSH 0.9 % IV SOLN
INTRAVENOUS | Status: AC
  Filled 2020-10-02: qty 40

## 2020-10-02 MED ORDER — PROPOFOL 10 MG/ML IV BOLUS
INTRAVENOUS | Status: AC
  Filled 2020-10-02: qty 20

## 2020-10-02 MED ORDER — NEOMYCIN-POLYMYXIN B GU 40-200000 IR SOLN
Status: AC
  Filled 2020-10-02: qty 20

## 2020-10-02 MED ORDER — PROPOFOL 1000 MG/100ML IV EMUL
INTRAVENOUS | Status: AC
  Filled 2020-10-02: qty 100

## 2020-10-02 SURGICAL SUPPLY — 66 items
BLADE SAW 90X13X1.19 OSCILLAT (BLADE) ×3 IMPLANT
BLADE SAW 90X25X1.19 OSCILLAT (BLADE) ×3 IMPLANT
CEMENT HV SMART SET (Cement) ×2 IMPLANT
CNTNR SPEC 2.5X3XGRAD LEK (MISCELLANEOUS) ×1
CONT SPEC 4OZ STER OR WHT (MISCELLANEOUS) ×1
CONT SPEC 4OZ STRL OR WHT (MISCELLANEOUS) ×1
CONTAINER SPEC 2.5X3XGRAD LEK (MISCELLANEOUS) ×2 IMPLANT
COOLER POLAR GLACIER W/PUMP (MISCELLANEOUS) ×3 IMPLANT
COVER WAND RF STERILE (DRAPES) ×3 IMPLANT
CUFF TOURN SGL QUICK 24 (TOURNIQUET CUFF)
CUFF TOURN SGL QUICK 30 (TOURNIQUET CUFF)
CUFF TRNQT CYL 24X4X16.5-23 (TOURNIQUET CUFF) IMPLANT
CUFF TRNQT CYL 30X4X21-28X (TOURNIQUET CUFF) IMPLANT
DRAPE 3/4 80X56 (DRAPES) ×6 IMPLANT
DRAPE IMP U-DRAPE 54X76 (DRAPES) ×6 IMPLANT
DRAPE INCISE IOBAN 66X60 STRL (DRAPES) ×3 IMPLANT
DRAPE SURG 17X11 SM STRL (DRAPES) ×6 IMPLANT
DRSG AQUACEL AG ADV 3.5X10 (GAUZE/BANDAGES/DRESSINGS) ×3 IMPLANT
DURAPREP 26ML APPLICATOR (WOUND CARE) ×9 IMPLANT
ELECT REM PT RETURN 9FT ADLT (ELECTROSURGICAL) ×2
ELECTRODE REM PT RTRN 9FT ADLT (ELECTROSURGICAL) ×2 IMPLANT
GAUZE SPONGE 4X4 12PLY STRL (GAUZE/BANDAGES/DRESSINGS) ×3 IMPLANT
GLOVE SRG 8 PF TXTR STRL LF DI (GLOVE) ×4 IMPLANT
GLOVE SURG 9.0 ORTHO LTXF (GLOVE) ×6 IMPLANT
GLOVE SURG ENC TEXT LTX SZ7.5 (GLOVE) ×3 IMPLANT
GLOVE SURG SYN 7.5  E (GLOVE) ×1
GLOVE SURG SYN 7.5 E (GLOVE) ×1 IMPLANT
GLOVE SURG SYN 7.5 PF PI (GLOVE) ×1 IMPLANT
GLOVE SURG UNDER POLY LF SZ8 (GLOVE) ×4
GLOVE SURG UNDER POLY LF SZ9 (GLOVE) ×3 IMPLANT
GOWN STRL REUS TWL 2XL XL LVL4 (GOWN DISPOSABLE) ×3 IMPLANT
GOWN STRL REUS W/ TWL LRG LVL3 (GOWN DISPOSABLE) ×2 IMPLANT
GOWN STRL REUS W/ TWL LRG LVL4 (GOWN DISPOSABLE) ×2 IMPLANT
GOWN STRL REUS W/ TWL XL LVL3 (GOWN DISPOSABLE) ×2 IMPLANT
GOWN STRL REUS W/TWL LRG LVL3 (GOWN DISPOSABLE) ×2
GOWN STRL REUS W/TWL LRG LVL4 (GOWN DISPOSABLE) ×2
GOWN STRL REUS W/TWL XL LVL3 (GOWN DISPOSABLE) ×2
HOLDER FOLEY CATH W/STRAP (MISCELLANEOUS) ×3 IMPLANT
IMMBOLIZER KNEE 19 BLUE UNIV (SOFTGOODS) ×3 IMPLANT
IV NS IRRIG 3000ML ARTHROMATIC (IV SOLUTION) ×3 IMPLANT
KIT TURNOVER KIT A (KITS) ×3 IMPLANT
MANIFOLD NEPTUNE II (INSTRUMENTS) ×6 IMPLANT
NDL SAFETY ECLIPSE 18X1.5 (NEEDLE) ×2 IMPLANT
NDL SPNL 20GX3.5 QUINCKE YW (NEEDLE) ×1 IMPLANT
NEEDLE HYPO 18GX1.5 SHARP (NEEDLE) ×2
NEEDLE HYPO 22GX1.5 SAFETY (NEEDLE) ×3 IMPLANT
NEEDLE SPNL 20GX3.5 QUINCKE YW (NEEDLE) ×2 IMPLANT
NS IRRIG 1000ML POUR BTL (IV SOLUTION) ×3 IMPLANT
PACK TOTAL KNEE (MISCELLANEOUS) ×3 IMPLANT
PAD WRAPON POLAR KNEE (MISCELLANEOUS) ×2 IMPLANT
PENCIL SMOKE EVACUATOR COATED (MISCELLANEOUS) ×3 IMPLANT
PULSAVAC PLUS IRRIG FAN TIP (DISPOSABLE) ×2
SPONGE LAP 18X18 RF (DISPOSABLE) IMPLANT
STAPLER SKIN PROX 35W (STAPLE) ×3 IMPLANT
SUCTION FRAZIER HANDLE 10FR (MISCELLANEOUS) ×1
SUCTION TUBE FRAZIER 10FR DISP (MISCELLANEOUS) ×2 IMPLANT
SUT ETHIBOND NAB CT1 #1 30IN (SUTURE) ×6 IMPLANT
SUT VIC AB 0 CT1 36 (SUTURE) ×3 IMPLANT
SUT VIC AB 2-0 CT1 (SUTURE) ×6 IMPLANT
SYR 20ML LL LF (SYRINGE) ×3 IMPLANT
SYR 30ML LL (SYRINGE) ×6 IMPLANT
TIP FAN IRRIG PULSAVAC PLUS (DISPOSABLE) ×2 IMPLANT
TOWER CARTRIDGE SMART MIX (DISPOSABLE) ×3 IMPLANT
TRAY FOLEY MTR SLVR 16FR STAT (SET/KITS/TRAYS/PACK) ×3 IMPLANT
TUBE SUCT KAM VAC (TUBING) ×3 IMPLANT
WRAPON POLAR PAD KNEE (MISCELLANEOUS) ×2

## 2020-10-02 NOTE — Anesthesia Preprocedure Evaluation (Signed)
Anesthesia Evaluation  Patient identified by MRN, date of birth, ID band Patient awake    Reviewed: Allergy & Precautions, NPO status , Patient's Chart, lab work & pertinent test results  History of Anesthesia Complications Negative for: history of anesthetic complications  Airway Mallampati: III  TM Distance: >3 FB Neck ROM: Full    Dental  (+) Poor Dentition, Missing   Pulmonary sleep apnea , neg COPD,    breath sounds clear to auscultation- rhonchi (-) wheezing      Cardiovascular Exercise Tolerance: Good (-) hypertension(-) CAD, (-) Past MI, (-) Cardiac Stents and (-) CABG  Rhythm:Regular Rate:Normal - Systolic murmurs and - Diastolic murmurs    Neuro/Psych neg Seizures PSYCHIATRIC DISORDERS Anxiety negative neurological ROS     GI/Hepatic Neg liver ROS, GERD  ,  Endo/Other  diabetes (diet controlled)Hypothyroidism   Renal/GU negative Renal ROS     Musculoskeletal  (+) Arthritis ,   Abdominal (+) - obese,   Peds  Hematology negative hematology ROS (+)   Anesthesia Other Findings Past Medical History: No date: Anxiety No date: Anxiety disorder No date: Aortic atherosclerosis (HCC) No date: Arthritis     Comment:  both knees No date: Cervical dysplasia     Comment:  a. 2010 - high grade squamous intraepithelial lesion s/p              excision. No date: Chest pain     Comment:  a. 2006 or 2007 Cath [facility]): reportedly nl cors;  b.              09/2011 ETT: nl. No date: DOE (dyspnea on exertion) No date: Family history of adverse reaction to anesthesia     Comment:  grand daughter was hard to wake up after back surgery No date: GERD (gastroesophageal reflux disease) No date: Grade II diastolic dysfunction No date: Herpes zoster No date: Hypothyroidism No date: Mild aortic regurgitation No date: Nutcracker esophagus No date: Orthostatic hypotension No date: Pneumonia No date: Pre-diabetes No date:  Sleep apnea     Comment:  does not use cpap, did not tolerate No date: Vertigo No date: Wears dentures     Comment:  partial lower   Reproductive/Obstetrics                             Lab Results  Component Value Date   WBC 6.6 09/26/2020   HGB 11.4 (L) 09/26/2020   HCT 34.7 (L) 09/26/2020   MCV 91.6 09/26/2020   PLT 150 09/26/2020    Anesthesia Physical Anesthesia Plan  ASA: II  Anesthesia Plan: Spinal   Post-op Pain Management:    Induction:   PONV Risk Score and Plan: 2 and Propofol infusion  Airway Management Planned: Natural Airway  Additional Equipment:   Intra-op Plan:   Post-operative Plan:   Informed Consent: I have reviewed the patients History and Physical, chart, labs and discussed the procedure including the risks, benefits and alternatives for the proposed anesthesia with the patient or authorized representative who has indicated his/her understanding and acceptance.     Dental advisory given  Plan Discussed with: CRNA and Anesthesiologist  Anesthesia Plan Comments:         Anesthesia Quick Evaluation

## 2020-10-02 NOTE — Progress Notes (Signed)
Met the patient in the preoperative area this morning.  I examined her legs, including the right side which is the site of surgery and the patient has numerous skin lesions in various stages of healing from contact dermatitis.  Patient has been scratching which has caused the skin breakdown.  She sees Dr. [name], the dermatologist, for this.  Due to the skin lesions, I am going to cancel her right total knee arthroplasty this morning.  I explained to the patient that the skin lesions place her at much higher risk for postoperative infection which is why case needs to be canceled today.  I am going to call the patient and antibiotics to treat the areas that appear cellulitic.  She should contact Dr. [name] regarding any topical medication that may help to resolve her contact dermatitis.  I also told her that she can stop by the office and pick up Coban to wrap over her lower extremities and prevent her from scratching her skin.  I spoke with the husband's patient by phone to explain to him the situation as well.  Both the patient and her husband were understanding and appreciated my caution and concern for her.  She will follow-up in my office in approximately 2 weeks for reevaluation of her lower extremities.

## 2020-10-11 ENCOUNTER — Telehealth: Payer: Self-pay | Admitting: Internal Medicine

## 2020-10-11 DIAGNOSIS — M170 Bilateral primary osteoarthritis of knee: Secondary | ICD-10-CM | POA: Diagnosis not present

## 2020-10-11 NOTE — Telephone Encounter (Signed)
Left message for patient to call back and schedule Medicare Annual Wellness Visit (AWV) in office.   If not able to come in office, please offer to do virtually or by telephone.   Last AWV:08/16/2020  Please schedule at anytime with Nurse Health Advisor.

## 2020-10-16 ENCOUNTER — Ambulatory Visit: Payer: Medicare Other | Admitting: Plastic Surgery

## 2020-10-16 ENCOUNTER — Other Ambulatory Visit: Payer: Self-pay | Admitting: Internal Medicine

## 2020-10-17 ENCOUNTER — Other Ambulatory Visit: Payer: Medicare Other

## 2020-10-23 ENCOUNTER — Other Ambulatory Visit: Payer: Self-pay

## 2020-10-23 ENCOUNTER — Other Ambulatory Visit
Admission: RE | Admit: 2020-10-23 | Discharge: 2020-10-23 | Disposition: A | Discharge status: Home / Self Care | Payer: Medicare Other | Source: Ambulatory Visit | Attending: Orthopedic Surgery | Admitting: Orthopedic Surgery

## 2020-10-23 DIAGNOSIS — Z01812 Encounter for preprocedural laboratory examination: Secondary | ICD-10-CM | POA: Diagnosis not present

## 2020-10-23 DIAGNOSIS — Z20822 Contact with and (suspected) exposure to covid-19: Secondary | ICD-10-CM | POA: Diagnosis not present

## 2020-10-24 ENCOUNTER — Other Ambulatory Visit: Payer: Self-pay | Admitting: Orthopedic Surgery

## 2020-10-24 LAB — SARS CORONAVIRUS 2 (TAT 6-24 HRS): SARS Coronavirus 2: NEGATIVE

## 2020-10-25 ENCOUNTER — Inpatient Hospital Stay
Admission: AD | Admit: 2020-10-25 | Discharge: 2020-10-30 | DRG: 470 | Disposition: A | Discharge status: Home Health | Payer: Medicare Other | Attending: Orthopedic Surgery | Admitting: Orthopedic Surgery

## 2020-10-25 ENCOUNTER — Ambulatory Visit: Payer: Medicare Other

## 2020-10-25 ENCOUNTER — Other Ambulatory Visit: Payer: Self-pay

## 2020-10-25 ENCOUNTER — Ambulatory Visit: Payer: Medicare Other | Admitting: Certified Registered"

## 2020-10-25 ENCOUNTER — Encounter
Admission: AD | Disposition: A | Discharge status: Home Health | Payer: Self-pay | Source: Home / Self Care | Attending: Orthopedic Surgery

## 2020-10-25 ENCOUNTER — Encounter: Payer: Self-pay | Admitting: Orthopedic Surgery

## 2020-10-25 DIAGNOSIS — G4730 Sleep apnea, unspecified: Secondary | ICD-10-CM | POA: Diagnosis present

## 2020-10-25 DIAGNOSIS — M25561 Pain in right knee: Secondary | ICD-10-CM

## 2020-10-25 DIAGNOSIS — Z800 Family history of malignant neoplasm of digestive organs: Secondary | ICD-10-CM | POA: Diagnosis not present

## 2020-10-25 DIAGNOSIS — Z888 Allergy status to other drugs, medicaments and biological substances status: Secondary | ICD-10-CM | POA: Diagnosis not present

## 2020-10-25 DIAGNOSIS — Z885 Allergy status to narcotic agent status: Secondary | ICD-10-CM | POA: Diagnosis not present

## 2020-10-25 DIAGNOSIS — R7303 Prediabetes: Secondary | ICD-10-CM | POA: Diagnosis not present

## 2020-10-25 DIAGNOSIS — R410 Disorientation, unspecified: Secondary | ICD-10-CM | POA: Diagnosis not present

## 2020-10-25 DIAGNOSIS — K219 Gastro-esophageal reflux disease without esophagitis: Secondary | ICD-10-CM | POA: Diagnosis not present

## 2020-10-25 DIAGNOSIS — Z79899 Other long term (current) drug therapy: Secondary | ICD-10-CM | POA: Diagnosis not present

## 2020-10-25 DIAGNOSIS — Z96651 Presence of right artificial knee joint: Secondary | ICD-10-CM

## 2020-10-25 DIAGNOSIS — Z881 Allergy status to other antibiotic agents status: Secondary | ICD-10-CM

## 2020-10-25 DIAGNOSIS — R42 Dizziness and giddiness: Secondary | ICD-10-CM | POA: Diagnosis not present

## 2020-10-25 DIAGNOSIS — E890 Postprocedural hypothyroidism: Secondary | ICD-10-CM | POA: Diagnosis not present

## 2020-10-25 DIAGNOSIS — R443 Hallucinations, unspecified: Secondary | ICD-10-CM | POA: Diagnosis not present

## 2020-10-25 DIAGNOSIS — Z96659 Presence of unspecified artificial knee joint: Secondary | ICD-10-CM

## 2020-10-25 DIAGNOSIS — Z471 Aftercare following joint replacement surgery: Secondary | ICD-10-CM | POA: Diagnosis not present

## 2020-10-25 DIAGNOSIS — M1711 Unilateral primary osteoarthritis, right knee: Principal | ICD-10-CM | POA: Diagnosis present

## 2020-10-25 DIAGNOSIS — Z820 Family history of epilepsy and other diseases of the nervous system: Secondary | ICD-10-CM

## 2020-10-25 DIAGNOSIS — I700 Atherosclerosis of aorta: Secondary | ICD-10-CM | POA: Diagnosis present

## 2020-10-25 DIAGNOSIS — Z79890 Hormone replacement therapy (postmenopausal): Secondary | ICD-10-CM

## 2020-10-25 DIAGNOSIS — F419 Anxiety disorder, unspecified: Secondary | ICD-10-CM | POA: Diagnosis present

## 2020-10-25 DIAGNOSIS — Z8249 Family history of ischemic heart disease and other diseases of the circulatory system: Secondary | ICD-10-CM

## 2020-10-25 HISTORY — PX: TOTAL KNEE ARTHROPLASTY: SHX125

## 2020-10-25 LAB — TYPE AND SCREEN
ABO/RH(D): A POS
Antibody Screen: NEGATIVE

## 2020-10-25 LAB — GLUCOSE, CAPILLARY
Glucose-Capillary: 124 mg/dL — ABNORMAL HIGH (ref 70–99)
Glucose-Capillary: 116 mg/dL — ABNORMAL HIGH (ref 70–99)

## 2020-10-25 SURGERY — ARTHROPLASTY, KNEE, TOTAL
Anesthesia: General | Site: Knee | Laterality: Right

## 2020-10-25 MED ORDER — CHLORHEXIDINE GLUCONATE 0.12 % MT SOLN: 15.0000 mL | Freq: Once | OROMUCOSAL | Status: AC

## 2020-10-25 MED ORDER — SODIUM CHLORIDE FLUSH 0.9 % IV SOLN
INTRAVENOUS | Status: AC
  Filled 2020-10-25: qty 10

## 2020-10-25 MED ORDER — METHOCARBAMOL 500 MG PO TABS
500.0000 mg | ORAL_TABLET | Freq: Four times a day (QID) | ORAL | Status: DC | PRN
  Administered 2020-10-25 – 2020-10-30 (×4): 500 mg via ORAL
  Filled 2020-10-25 (×4): qty 1

## 2020-10-25 MED ORDER — ORAL CARE MOUTH RINSE: 15.0000 mL | Freq: Once | OROMUCOSAL | Status: AC

## 2020-10-25 MED ORDER — EPHEDRINE SULFATE 50 MG/ML IJ SOLN: 2.5000 mg | INTRAMUSCULAR | Status: DC | PRN

## 2020-10-25 MED ORDER — MORPHINE SULFATE 4 MG/ML IJ SOLN
INTRAMUSCULAR | Status: DC | PRN
  Administered 2020-10-25: 4 mg via INTRAMUSCULAR

## 2020-10-25 MED ORDER — OXYMETAZOLINE HCL 0.05 % NA SOLN
NASAL | Status: DC | PRN
  Administered 2020-10-25 (×2): 2 via NASAL

## 2020-10-25 MED ORDER — MIDAZOLAM HCL 5 MG/5ML IJ SOLN
INTRAMUSCULAR | Status: DC | PRN
  Administered 2020-10-25 (×2): 1 mg via INTRAVENOUS

## 2020-10-25 MED ORDER — ONDANSETRON HCL 4 MG/2ML IJ SOLN
INTRAMUSCULAR | Status: DC | PRN
  Administered 2020-10-25: 4 mg via INTRAVENOUS

## 2020-10-25 MED ORDER — ACETAMINOPHEN 325 MG PO TABS
325.0000 mg | ORAL_TABLET | ORAL | Status: DC | PRN
Start: 2020-10-25 — End: 2020-10-25

## 2020-10-25 MED ORDER — SODIUM CHLORIDE 0.9 % IV SOLN
INTRAVENOUS | Status: DC | PRN
  Administered 2020-10-25: 30 ug/min via INTRAVENOUS

## 2020-10-25 MED ORDER — BUPIVACAINE-EPINEPHRINE (PF) 0.25% -1:200000 IJ SOLN
INTRAMUSCULAR | Status: AC
  Filled 2020-10-25: qty 60

## 2020-10-25 MED ORDER — BISACODYL 10 MG RE SUPP
10.0000 mg | Freq: Every day | RECTAL | Status: DC | PRN
  Filled 2020-10-25: qty 1

## 2020-10-25 MED ORDER — EPHEDRINE 5 MG/ML INJ
INTRAVENOUS | Status: AC
  Filled 2020-10-25: qty 10

## 2020-10-25 MED ORDER — CLINDAMYCIN PHOSPHATE 600 MG/50ML IV SOLN
INTRAVENOUS | Status: AC
  Filled 2020-10-25: qty 50

## 2020-10-25 MED ORDER — OXYCODONE HCL 5 MG PO TABS
10.0000 mg | ORAL_TABLET | ORAL | Status: DC | PRN
  Administered 2020-10-26: 10 mg via ORAL
  Filled 2020-10-25: qty 2

## 2020-10-25 MED ORDER — CEFAZOLIN SODIUM-DEXTROSE 2-4 GM/100ML-% IV SOLN
INTRAVENOUS | Status: AC
  Administered 2020-10-25: 2 g via INTRAVENOUS
  Filled 2020-10-25: qty 100

## 2020-10-25 MED ORDER — CEFAZOLIN SODIUM-DEXTROSE 2-4 GM/100ML-% IV SOLN
2.0000 g | INTRAVENOUS | Status: AC
  Administered 2020-10-25: 2 g via INTRAVENOUS

## 2020-10-25 MED ORDER — EZETIMIBE 10 MG PO TABS
10.0000 mg | ORAL_TABLET | Freq: Every day | ORAL | Status: DC
  Administered 2020-10-25 – 2020-10-29 (×5): 10 mg via ORAL
  Filled 2020-10-25 (×6): qty 1

## 2020-10-25 MED ORDER — PROPOFOL 500 MG/50ML IV EMUL
INTRAVENOUS | Status: AC
  Filled 2020-10-25: qty 50

## 2020-10-25 MED ORDER — CELECOXIB 200 MG PO CAPS: 200.0000 mg | ORAL_CAPSULE | ORAL | Status: AC

## 2020-10-25 MED ORDER — LEVOTHYROXINE SODIUM 112 MCG PO TABS
112.0000 ug | ORAL_TABLET | Freq: Every morning | ORAL | Status: DC
  Administered 2020-10-26 – 2020-10-30 (×5): 112 ug via ORAL
  Filled 2020-10-25 (×5): qty 1

## 2020-10-25 MED ORDER — ONDANSETRON HCL 4 MG PO TABS: 4.0000 mg | ORAL_TABLET | Freq: Four times a day (QID) | ORAL | Status: DC | PRN

## 2020-10-25 MED ORDER — ACETAMINOPHEN 500 MG PO TABS
ORAL_TABLET | ORAL | Status: AC
  Administered 2020-10-25: 1000 mg via ORAL
  Filled 2020-10-25: qty 2

## 2020-10-25 MED ORDER — MECLIZINE HCL 25 MG PO TABS
25.0000 mg | ORAL_TABLET | Freq: Three times a day (TID) | ORAL | Status: DC | PRN
  Administered 2020-10-28 (×2): 25 mg via ORAL
  Filled 2020-10-25 (×4): qty 1

## 2020-10-25 MED ORDER — DOCUSATE SODIUM 100 MG PO CAPS
100.0000 mg | ORAL_CAPSULE | Freq: Two times a day (BID) | ORAL | Status: DC
  Administered 2020-10-25 – 2020-10-30 (×10): 100 mg via ORAL
  Filled 2020-10-25 (×10): qty 1

## 2020-10-25 MED ORDER — ACETAMINOPHEN 500 MG PO TABS: 1000.0000 mg | ORAL_TABLET | ORAL | Status: AC

## 2020-10-25 MED ORDER — OXYMETAZOLINE HCL 0.05 % NA SOLN
NASAL | Status: AC
  Filled 2020-10-25: qty 30

## 2020-10-25 MED ORDER — CLINDAMYCIN PHOSPHATE 600 MG/50ML IV SOLN
600.0000 mg | Freq: Once | INTRAVENOUS | Status: AC
  Administered 2020-10-25: 600 mg via INTRAVENOUS

## 2020-10-25 MED ORDER — CHLORHEXIDINE GLUCONATE CLOTH 2 % EX PADS: 6.0000 | MEDICATED_PAD | Freq: Once | CUTANEOUS | Status: DC

## 2020-10-25 MED ORDER — CEFAZOLIN SODIUM-DEXTROSE 2-4 GM/100ML-% IV SOLN
2.0000 g | Freq: Once | INTRAVENOUS | Status: AC
  Administered 2020-10-26: 2 g via INTRAVENOUS
  Filled 2020-10-25: qty 100

## 2020-10-25 MED ORDER — DIPHENHYDRAMINE HCL 12.5 MG/5ML PO ELIX
12.5000 mg | ORAL_SOLUTION | ORAL | Status: DC | PRN
Start: 2020-10-25 — End: 2020-10-30
  Filled 2020-10-25: qty 10

## 2020-10-25 MED ORDER — OXYCODONE HCL 5 MG PO TABS
ORAL_TABLET | ORAL | Status: AC
  Filled 2020-10-25: qty 1

## 2020-10-25 MED ORDER — CELECOXIB 200 MG PO CAPS
ORAL_CAPSULE | ORAL | Status: AC
  Administered 2020-10-25: 200 mg via ORAL
  Filled 2020-10-25: qty 1

## 2020-10-25 MED ORDER — CEFAZOLIN SODIUM-DEXTROSE 2-4 GM/100ML-% IV SOLN
INTRAVENOUS | Status: AC
  Filled 2020-10-25: qty 100

## 2020-10-25 MED ORDER — ONDANSETRON HCL 4 MG/2ML IJ SOLN: 4.0000 mg | Freq: Once | INTRAMUSCULAR | Status: DC | PRN

## 2020-10-25 MED ORDER — BUPIVACAINE-EPINEPHRINE 0.25% -1:200000 IJ SOLN
INTRAMUSCULAR | Status: DC | PRN
  Administered 2020-10-25: 60 mL

## 2020-10-25 MED ORDER — ONDANSETRON HCL 4 MG/2ML IJ SOLN
INTRAMUSCULAR | Status: AC
  Filled 2020-10-25: qty 2

## 2020-10-25 MED ORDER — SODIUM CHLORIDE 0.9 % IV SOLN
INTRAVENOUS | Status: DC | PRN
  Administered 2020-10-25: 70 mL

## 2020-10-25 MED ORDER — ENOXAPARIN SODIUM 40 MG/0.4ML IJ SOSY
40.0000 mg | PREFILLED_SYRINGE | INTRAMUSCULAR | Status: DC
  Administered 2020-10-26 – 2020-10-30 (×5): 40 mg via SUBCUTANEOUS
  Filled 2020-10-25 (×6): qty 0.4

## 2020-10-25 MED ORDER — EPHEDRINE SULFATE 50 MG/ML IJ SOLN
INTRAMUSCULAR | Status: DC | PRN
  Administered 2020-10-25: 10 mg via INTRAVENOUS

## 2020-10-25 MED ORDER — ROSUVASTATIN CALCIUM 20 MG PO TABS
20.0000 mg | ORAL_TABLET | ORAL | Status: DC
  Administered 2020-10-26 – 2020-10-30 (×3): 20 mg via ORAL
  Filled 2020-10-25: qty 2
  Filled 2020-10-25: qty 1
  Filled 2020-10-25 (×2): qty 2
  Filled 2020-10-25 (×2): qty 1

## 2020-10-25 MED ORDER — NEOMYCIN-POLYMYXIN B GU 40-200000 IR SOLN
Status: DC | PRN
  Administered 2020-10-25: 12 mL
  Administered 2020-10-25: 4 mL

## 2020-10-25 MED ORDER — DROPERIDOL 2.5 MG/ML IJ SOLN
0.6250 mg | Freq: Once | INTRAMUSCULAR | Status: DC | PRN
  Filled 2020-10-25: qty 2

## 2020-10-25 MED ORDER — OXYCODONE HCL 5 MG PO TABS
5.0000 mg | ORAL_TABLET | ORAL | Status: DC | PRN
  Administered 2020-10-25 (×3): 5 mg via ORAL
  Filled 2020-10-25 (×3): qty 1

## 2020-10-25 MED ORDER — SODIUM CHLORIDE 0.9 % IV SOLN: INTRAVENOUS | Status: DC

## 2020-10-25 MED ORDER — ONDANSETRON HCL 4 MG/2ML IJ SOLN
4.0000 mg | Freq: Four times a day (QID) | INTRAMUSCULAR | Status: DC | PRN
  Administered 2020-10-28: 4 mg via INTRAVENOUS
  Filled 2020-10-25: qty 2

## 2020-10-25 MED ORDER — SENNOSIDES-DOCUSATE SODIUM 8.6-50 MG PO TABS: 1.0000 | ORAL_TABLET | Freq: Every evening | ORAL | Status: DC | PRN

## 2020-10-25 MED ORDER — FUROSEMIDE 20 MG PO TABS: 20.0000 mg | ORAL_TABLET | Freq: Every day | ORAL | Status: DC | PRN

## 2020-10-25 MED ORDER — KETOROLAC TROMETHAMINE 30 MG/ML IJ SOLN
INTRAMUSCULAR | Status: DC | PRN
  Administered 2020-10-25: 30 mg via INTRAMUSCULAR

## 2020-10-25 MED ORDER — PROPOFOL 10 MG/ML IV BOLUS
INTRAVENOUS | Status: DC | PRN
  Administered 2020-10-25: 20 mg via INTRAVENOUS

## 2020-10-25 MED ORDER — ACETAMINOPHEN 500 MG PO TABS
1000.0000 mg | ORAL_TABLET | Freq: Four times a day (QID) | ORAL | Status: AC
  Administered 2020-10-25 – 2020-10-26 (×3): 1000 mg via ORAL
  Filled 2020-10-25 (×2): qty 2

## 2020-10-25 MED ORDER — TRAMADOL HCL 50 MG PO TABS
50.0000 mg | ORAL_TABLET | Freq: Four times a day (QID) | ORAL | Status: DC
  Administered 2020-10-25 – 2020-10-29 (×9): 50 mg via ORAL
  Filled 2020-10-25 (×10): qty 1

## 2020-10-25 MED ORDER — HYDROMORPHONE HCL 1 MG/ML IJ SOLN
0.5000 mg | INTRAMUSCULAR | Status: DC | PRN
  Administered 2020-10-26: 0.5 mg via INTRAVENOUS
  Filled 2020-10-25: qty 1

## 2020-10-25 MED ORDER — SODIUM CHLORIDE (PF) 0.9 % IJ SOLN: INTRAMUSCULAR | Status: DC | PRN

## 2020-10-25 MED ORDER — MENTHOL 3 MG MT LOZG
1.0000 | LOZENGE | OROMUCOSAL | Status: DC | PRN
  Filled 2020-10-25: qty 9

## 2020-10-25 MED ORDER — PHENOL 1.4 % MT LIQD
1.0000 | OROMUCOSAL | Status: DC | PRN
  Filled 2020-10-25: qty 177

## 2020-10-25 MED ORDER — KETOROLAC TROMETHAMINE 30 MG/ML IJ SOLN
INTRAMUSCULAR | Status: AC
  Filled 2020-10-25: qty 1

## 2020-10-25 MED ORDER — MIDAZOLAM HCL 2 MG/2ML IJ SOLN
INTRAMUSCULAR | Status: AC
  Filled 2020-10-25: qty 2

## 2020-10-25 MED ORDER — SODIUM CHLORIDE (PF) 0.9 % IJ SOLN
INTRAMUSCULAR | Status: AC
  Filled 2020-10-25: qty 50

## 2020-10-25 MED ORDER — ACETAMINOPHEN 500 MG PO TABS
ORAL_TABLET | ORAL | Status: AC
  Filled 2020-10-25: qty 2

## 2020-10-25 MED ORDER — PHENYLEPHRINE HCL (PRESSORS) 10 MG/ML IV SOLN
INTRAVENOUS | Status: DC | PRN
  Administered 2020-10-25 (×2): 100 ug via INTRAVENOUS

## 2020-10-25 MED ORDER — ACETAMINOPHEN 325 MG PO TABS
325.0000 mg | ORAL_TABLET | Freq: Four times a day (QID) | ORAL | Status: DC | PRN
  Administered 2020-10-27 – 2020-10-29 (×3): 650 mg via ORAL
  Filled 2020-10-25 (×2): qty 2

## 2020-10-25 MED ORDER — HYOSCYAMINE SULFATE 0.125 MG SL SUBL
0.1250 mg | SUBLINGUAL_TABLET | SUBLINGUAL | Status: DC | PRN
  Filled 2020-10-25: qty 1

## 2020-10-25 MED ORDER — CEFAZOLIN SODIUM-DEXTROSE 2-4 GM/100ML-% IV SOLN: 2.0000 g | Freq: Four times a day (QID) | INTRAVENOUS | Status: AC

## 2020-10-25 MED ORDER — SERTRALINE HCL 100 MG PO TABS
200.0000 mg | ORAL_TABLET | Freq: Every morning | ORAL | Status: DC
  Administered 2020-10-26 – 2020-10-30 (×5): 200 mg via ORAL
  Filled 2020-10-25 (×5): qty 2

## 2020-10-25 MED ORDER — ACETAMINOPHEN 160 MG/5ML PO SOLN
325.0000 mg | ORAL | Status: DC | PRN
  Filled 2020-10-25: qty 20.3

## 2020-10-25 MED ORDER — CHLORHEXIDINE GLUCONATE 0.12 % MT SOLN
OROMUCOSAL | Status: AC
  Administered 2020-10-25: 15 mL via OROMUCOSAL
  Filled 2020-10-25: qty 15

## 2020-10-25 MED ORDER — FENTANYL CITRATE (PF) 100 MCG/2ML IJ SOLN
INTRAMUSCULAR | Status: AC
  Administered 2020-10-25: 25 ug via INTRAVENOUS
  Filled 2020-10-25: qty 2

## 2020-10-25 MED ORDER — PHENYLEPHRINE HCL (PRESSORS) 10 MG/ML IV SOLN
INTRAVENOUS | Status: AC
  Filled 2020-10-25: qty 1

## 2020-10-25 MED ORDER — FENTANYL CITRATE (PF) 100 MCG/2ML IJ SOLN
25.0000 ug | INTRAMUSCULAR | Status: DC | PRN
  Administered 2020-10-25 (×2): 25 ug via INTRAVENOUS

## 2020-10-25 MED ORDER — METHOCARBAMOL 1000 MG/10ML IJ SOLN
500.0000 mg | Freq: Four times a day (QID) | INTRAVENOUS | Status: DC | PRN
  Filled 2020-10-25: qty 5

## 2020-10-25 MED ORDER — TRAMADOL HCL 50 MG PO TABS
ORAL_TABLET | ORAL | Status: AC
  Filled 2020-10-25: qty 1

## 2020-10-25 MED ORDER — BUPIVACAINE LIPOSOME 1.3 % IJ SUSP
INTRAMUSCULAR | Status: AC
  Filled 2020-10-25: qty 20

## 2020-10-25 MED ORDER — EPHEDRINE SULFATE 50 MG/ML IJ SOLN
INTRAMUSCULAR | Status: AC
  Filled 2020-10-25: qty 1

## 2020-10-25 MED ORDER — PROPOFOL 500 MG/50ML IV EMUL
INTRAVENOUS | Status: DC | PRN
  Administered 2020-10-25: 50 ug/kg/min via INTRAVENOUS

## 2020-10-25 MED ORDER — METHOCARBAMOL 500 MG PO TABS
ORAL_TABLET | ORAL | Status: AC
  Filled 2020-10-25: qty 1

## 2020-10-25 MED ORDER — ALPRAZOLAM 0.5 MG PO TABS
0.5000 mg | ORAL_TABLET | Freq: Two times a day (BID) | ORAL | Status: DC
  Administered 2020-10-25 – 2020-10-30 (×10): 0.5 mg via ORAL
  Filled 2020-10-25 (×10): qty 1

## 2020-10-25 MED ORDER — PANTOPRAZOLE SODIUM 40 MG PO TBEC
40.0000 mg | DELAYED_RELEASE_TABLET | Freq: Every day | ORAL | Status: DC
  Administered 2020-10-26 – 2020-10-30 (×5): 40 mg via ORAL
  Filled 2020-10-25 (×6): qty 1

## 2020-10-25 MED ORDER — TRANEXAMIC ACID 1000 MG/10ML IV SOLN
INTRAVENOUS | Status: AC
  Filled 2020-10-25: qty 10

## 2020-10-25 MED ORDER — DILTIAZEM HCL ER COATED BEADS 120 MG PO CP24
120.0000 mg | ORAL_CAPSULE | Freq: Every morning | ORAL | Status: DC
  Administered 2020-10-27 – 2020-10-30 (×4): 120 mg via ORAL
  Filled 2020-10-25 (×6): qty 1

## 2020-10-25 MED ORDER — TRANEXAMIC ACID-NACL 1000-0.7 MG/100ML-% IV SOLN
1000.0000 mg | INTRAVENOUS | Status: AC
  Administered 2020-10-25: 1000 mg via INTRAVENOUS

## 2020-10-25 MED ORDER — BUPIVACAINE HCL (PF) 0.5 % IJ SOLN
INTRAMUSCULAR | Status: DC | PRN
  Administered 2020-10-25: 2.5 mL

## 2020-10-25 MED ORDER — MORPHINE SULFATE (PF) 4 MG/ML IV SOLN
INTRAVENOUS | Status: AC
  Filled 2020-10-25: qty 1

## 2020-10-25 SURGICAL SUPPLY — 71 items
BLADE SAW 90X13X1.19 OSCILLAT (BLADE) ×2 IMPLANT
BLADE SAW 90X25X1.19 OSCILLAT (BLADE) ×2 IMPLANT
CEMENT HV SMART SET (Cement) ×4 IMPLANT
CEMENT TIBIA MBT SIZE 2.5 (Knees) IMPLANT
CNTNR SPEC 2.5X3XGRAD LEK (MISCELLANEOUS) ×1
CONT SPEC 4OZ STER OR WHT (MISCELLANEOUS) ×1
CONT SPEC 4OZ STRL OR WHT (MISCELLANEOUS) ×1
CONTAINER SPEC 2.5X3XGRAD LEK (MISCELLANEOUS) ×1 IMPLANT
COOLER POLAR GLACIER W/PUMP (MISCELLANEOUS) ×2 IMPLANT
COVER WAND RF STERILE (DRAPES) ×2 IMPLANT
CUFF TOURN SGL QUICK 24 (TOURNIQUET CUFF)
CUFF TOURN SGL QUICK 34 (TOURNIQUET CUFF)
CUFF TRNQT CYL 24X4X16.5-23 (TOURNIQUET CUFF) IMPLANT
CUFF TRNQT CYL 34X4.125X (TOURNIQUET CUFF) IMPLANT
DRAPE 3/4 80X56 (DRAPES) ×4 IMPLANT
DRAPE IMP U-DRAPE 54X76 (DRAPES) ×4 IMPLANT
DRAPE INCISE IOBAN 66X60 STRL (DRAPES) ×2 IMPLANT
DRAPE SURG 17X11 SM STRL (DRAPES) ×4 IMPLANT
DRSG AQUACEL AG ADV 3.5X10 (GAUZE/BANDAGES/DRESSINGS) ×2 IMPLANT
DURAPREP 26ML APPLICATOR (WOUND CARE) ×6 IMPLANT
ELECT REM PT RETURN 9FT ADLT (ELECTROSURGICAL) ×2
ELECTRODE REM PT RTRN 9FT ADLT (ELECTROSURGICAL) ×1 IMPLANT
FEMUR SIGMA PS SZ 2.5 R (Femur) ×1 IMPLANT
GAUZE SPONGE 4X4 12PLY STRL (GAUZE/BANDAGES/DRESSINGS) ×2 IMPLANT
GLOVE SRG 8 PF TXTR STRL LF DI (GLOVE) ×2 IMPLANT
GLOVE SURG 9.0 ORTHO LTXF (GLOVE) ×4 IMPLANT
GLOVE SURG ENC TEXT LTX SZ7.5 (GLOVE) ×2 IMPLANT
GLOVE SURG SYN 7.5  E (GLOVE) ×2
GLOVE SURG SYN 7.5 E (GLOVE) ×1 IMPLANT
GLOVE SURG SYN 7.5 PF PI (GLOVE) ×1 IMPLANT
GLOVE SURG UNDER POLY LF SZ8 (GLOVE) ×4
GLOVE SURG UNDER POLY LF SZ9 (GLOVE) ×2 IMPLANT
GOWN STRL REUS TWL 2XL XL LVL4 (GOWN DISPOSABLE) ×2 IMPLANT
GOWN STRL REUS W/ TWL LRG LVL3 (GOWN DISPOSABLE) ×1 IMPLANT
GOWN STRL REUS W/ TWL LRG LVL4 (GOWN DISPOSABLE) ×1 IMPLANT
GOWN STRL REUS W/ TWL XL LVL3 (GOWN DISPOSABLE) ×1 IMPLANT
GOWN STRL REUS W/TWL LRG LVL3 (GOWN DISPOSABLE) ×2
GOWN STRL REUS W/TWL LRG LVL4 (GOWN DISPOSABLE) ×2
GOWN STRL REUS W/TWL XL LVL3 (GOWN DISPOSABLE) ×2
HOLDER FOLEY CATH W/STRAP (MISCELLANEOUS) ×2 IMPLANT
IMMBOLIZER KNEE 19 BLUE UNIV (SOFTGOODS) ×2 IMPLANT
IV NS IRRIG 3000ML ARTHROMATIC (IV SOLUTION) ×2 IMPLANT
KIT TURNOVER KIT A (KITS) ×2 IMPLANT
MANIFOLD NEPTUNE II (INSTRUMENTS) ×4 IMPLANT
NDL SAFETY ECLIPSE 18X1.5 (NEEDLE) ×1 IMPLANT
NDL SPNL 20GX3.5 QUINCKE YW (NEEDLE) ×1 IMPLANT
NEEDLE HYPO 18GX1.5 SHARP (NEEDLE) ×2
NEEDLE HYPO 22GX1.5 SAFETY (NEEDLE) ×2 IMPLANT
NEEDLE SPNL 20GX3.5 QUINCKE YW (NEEDLE) ×2 IMPLANT
NS IRRIG 1000ML POUR BTL (IV SOLUTION) ×2 IMPLANT
PACK TOTAL KNEE (MISCELLANEOUS) ×2 IMPLANT
PAD WRAPON POLAR KNEE (MISCELLANEOUS) ×1 IMPLANT
PATELLA DOME PFC 35MM (Knees) ×1 IMPLANT
PENCIL SMOKE EVACUATOR COATED (MISCELLANEOUS) ×2 IMPLANT
PLATE ROT INSERT 10MM SIZE 2.5 (Plate) ×1 IMPLANT
PULSAVAC PLUS IRRIG FAN TIP (DISPOSABLE) ×2
SPONGE LAP 18X18 RF (DISPOSABLE) IMPLANT
STAPLER SKIN PROX 35W (STAPLE) ×2 IMPLANT
SUCTION FRAZIER HANDLE 10FR (MISCELLANEOUS) ×2
SUCTION TUBE FRAZIER 10FR DISP (MISCELLANEOUS) ×1 IMPLANT
SUT ETHIBOND NAB CT1 #1 30IN (SUTURE) ×4 IMPLANT
SUT VIC AB 0 CT1 36 (SUTURE) ×2 IMPLANT
SUT VIC AB 2-0 CT1 (SUTURE) ×4 IMPLANT
SYR 20ML LL LF (SYRINGE) ×2 IMPLANT
SYR 30ML LL (SYRINGE) ×4 IMPLANT
TIBIA MBT CEMENT SIZE 2.5 (Knees) ×2 IMPLANT
TIP FAN IRRIG PULSAVAC PLUS (DISPOSABLE) ×1 IMPLANT
TOWER CARTRIDGE SMART MIX (DISPOSABLE) ×2 IMPLANT
TRAY FOLEY MTR SLVR 16FR STAT (SET/KITS/TRAYS/PACK) ×2 IMPLANT
TUBE SUCT KAM VAC (TUBING) ×2 IMPLANT
WRAPON POLAR PAD KNEE (MISCELLANEOUS) ×2

## 2020-10-25 NOTE — Anesthesia Preprocedure Evaluation (Addendum)
Anesthesia Evaluation  Patient identified by MRN, date of birth, ID band Patient awake    Reviewed: Allergy & Precautions, H&P , NPO status , reviewed documented beta blocker date and time   History of Anesthesia Complications (+) Family history of anesthesia reaction  Airway Mallampati: III  TM Distance: >3 FB Neck ROM: full    Dental  (+) Caps, Partial Lower   Pulmonary sleep apnea , pneumonia, resolved,    Pulmonary exam normal        Cardiovascular + DOE  Normal cardiovascular exam+ dysrhythmias   09/2020 ECHO IMPRESSIONS    1. Left ventricular ejection fraction, by estimation, is 60 to 65%. The  left ventricle has normal function. The left ventricle has no regional  wall motion abnormalities. Left ventricular diastolic parameters are  consistent with Grade II diastolic  dysfunction (pseudonormalization).  2. Right ventricular systolic function is normal. The right ventricular  size is normal.  3. The mitral valve is normal in structure. No evidence of mitral valve  regurgitation.  4. The aortic valve is tricuspid. Aortic valve regurgitation is mild.  5. The inferior vena cava is dilated in size with <50% respiratory  variability, suggesting right atrial pressure of 15 mmHg.    Neuro/Psych PSYCHIATRIC DISORDERS Anxiety    GI/Hepatic GERD  Medicated and Controlled,  Endo/Other  diabetesHypothyroidism   Renal/GU      Musculoskeletal  (+) Arthritis ,   Abdominal   Peds  Hematology   Anesthesia Other Findings Past Medical History: No date: Anxiety No date: Anxiety disorder No date: Aortic atherosclerosis (HCC) No date: Arthritis     Comment:  both knees No date: Cervical dysplasia     Comment:  a. 2010 - high grade squamous intraepithelial lesion s/p              excision. No date: Chest pain     Comment:  a. 2006 or 2007 Cath [facility]): reportedly nl cors;  b.              09/2011 ETT: nl. No  date: DOE (dyspnea on exertion) No date: Family history of adverse reaction to anesthesia     Comment:  grand daughter was hard to wake up after back surgery No date: GERD (gastroesophageal reflux disease) No date: Grade II diastolic dysfunction No date: Herpes zoster No date: Hypothyroidism No date: Mild aortic regurgitation No date: Nutcracker esophagus No date: Orthostatic hypotension No date: Pneumonia No date: Pre-diabetes No date: Sleep apnea     Comment:  does not use cpap, did not tolerate No date: Vertigo No date: Wears dentures     Comment:  partial lower  Past Surgical History: No date: AUGMENTATION MAMMAPLASTY; Bilateral No date: BREAST EXCISIONAL BIOPSY; Left 2003: CARDIAC CATHETERIZATION     Comment:  Dr.Callwood, R/L heart cath, 06/12/2017: COLONOSCOPY WITH PROPOFOL; N/A     Comment:  Procedure: COLONOSCOPY WITH PROPOFOL;  Surgeon: Lucilla Lame, MD;  Location: [place];  Service:               Endoscopy;  Laterality: N/A; No date: dental work 06/19/2016: ESOPHAGEAL MANOMETRY; N/A     Comment:  Procedure: ESOPHAGEAL MANOMETRY (EM);  Surgeon: Lucilla Lame, MD;  Location: [facility];  Service: Endoscopy;              Laterality: N/A;  04/03/2017: ESOPHAGOGASTRODUODENOSCOPY (EGD) WITH PROPOFOL; N/A     Comment:  Procedure: ESOPHAGOGASTRODUODENOSCOPY (EGD) WITH               PROPOFOL;  Surgeon: Lucilla Lame, MD;  Location: [place];  Service: Endoscopy;  Laterality: N/A;                diabetic - diet controlled 05/15/15: THYROIDECTOMY     Comment:  [facility] No date: TONSILLECTOMY  BMI    Body Mass Index: 25.06 kg/m      Reproductive/Obstetrics                            Anesthesia Physical Anesthesia Plan  ASA: III  Anesthesia Plan: General and Spinal   Post-op Pain Management:    Induction: Intravenous  PONV Risk Score and Plan: Treatment may vary due to age or  medical condition and TIVA  Airway Management Planned: Nasal Cannula and Natural Airway  Additional Equipment:   Intra-op Plan:   Post-operative Plan:   Informed Consent: I have reviewed the patients History and Physical, chart, labs and discussed the procedure including the risks, benefits and alternatives for the proposed anesthesia with the patient or authorized representative who has indicated his/her understanding and acceptance.     Dental Advisory Given  Plan Discussed with: CRNA  Anesthesia Plan Comments:        Anesthesia Quick Evaluation

## 2020-10-25 NOTE — Anesthesia Procedure Notes (Signed)
Spinal  Patient location during procedure: OR Start time: 10/25/2020 7:57 AM End time: 10/25/2020 8:02 AM Reason for block: surgical anesthesia Staffing Performed: resident/CRNA  Anesthesiologist: Alphonsus Sias, MD Resident/CRNA: Jerrye Noble, CRNA Preanesthetic Checklist Completed: patient identified, IV checked, site marked, risks and benefits discussed, surgical consent, monitors and equipment checked, pre-op evaluation and timeout performed Spinal Block Patient position: sitting Prep: ChloraPrep Patient monitoring: continuous pulse ox and blood pressure Approach: midline Location: L3-4 Injection technique: single-shot Needle Needle type: Sprotte and Pencan  Needle gauge: 24 G Needle length: 9 cm Assessment Sensory level: T4 Events: CSF return Additional Notes Negative paresthesia, Negative Heme

## 2020-10-25 NOTE — Anesthesia Postprocedure Evaluation (Signed)
Anesthesia Post Note  Patient: Megan Bradley  Procedure(s) Performed: RIGHT TOTAL KNEE ARTHROPLASTY (Right Knee)  Patient location during evaluation: PACU Anesthesia Type: Spinal Level of consciousness: awake and alert Pain management: pain level controlled Vital Signs Assessment: post-procedure vital signs reviewed and stable Respiratory status: spontaneous breathing and respiratory function stable Cardiovascular status: blood pressure returned to baseline and stable Postop Assessment: spinal receding Anesthetic complications: no   No complications documented.   Last Vitals:  Vitals:   10/25/20 1430 10/25/20 1445  BP: (!) 113/59   Pulse: (!) 59 (!) 58  Resp: 12 (!) 26  Temp:    SpO2: 95% 94%    Last Pain:  Vitals:   10/25/20 1430  TempSrc:   PainSc: Asleep                 Alphonsus Sias

## 2020-10-25 NOTE — Anesthesia Procedure Notes (Signed)
Procedure Name: MAC Date/Time: 10/25/2020 8:05 AM Performed by: Jerrye Noble, CRNA Pre-anesthesia Checklist: Patient identified, Emergency Drugs available, Suction available and Patient being monitored Patient Re-evaluated:Patient Re-evaluated prior to induction Oxygen Delivery Method: Simple face mask

## 2020-10-25 NOTE — Transfer of Care (Signed)
Immediate Anesthesia Transfer of Care Note  Patient: Megan Bradley  Procedure(s) Performed: RIGHT TOTAL KNEE ARTHROPLASTY (Right Knee)  Patient Location: PACU  Anesthesia Type:Spinal  Level of Consciousness: drowsy  Airway & Oxygen Therapy: Patient Spontanous Breathing and Patient connected to face mask oxygen  Post-op Assessment: Report given to RN and Post -op Vital signs reviewed and stable  Post vital signs: Reviewed and stable  Last Vitals:  Vitals Value Taken Time  BP 102/49 10/25/20 1110  Temp    Pulse    Resp 15 10/25/20 1113  SpO2    Vitals shown include unvalidated device data.  Last Pain:  Vitals:   10/25/20 0641  TempSrc: Temporal  PainSc: 2          Complications: No complications documented.

## 2020-10-25 NOTE — H&P (Signed)
PREOPERATIVE H&P  Chief Complaint: Right Knee Osteoarthritis  HPI: Megan Bradley is a 80 y.o. female who presents for preoperative history and physical with a diagnosis of Right Knee Osteoarthritis. Symptoms of significant right knee pain is significantly impairing her ctivities of daily living including ambulation.  She has failed non-operative management and wished to proceed with a right total knee arthroplasty.  Radiographs have revealed joint space narrowing, marginal osteophytes and subchondral sclerosis.  Past Medical History:  Diagnosis Date  . Anxiety   . Anxiety disorder   . Aortic atherosclerosis ([place])   . Arthritis    both knees  . Cervical dysplasia    a. 2010 - high grade squamous intraepithelial lesion s/p excision.  . Chest pain    a. 2006 or 2007 Cath [facility]): reportedly nl cors;  b. 09/2011 ETT: nl.  . DOE (dyspnea on exertion)   . Family history of adverse reaction to anesthesia    grand daughter was hard to wake up after back surgery  . GERD (gastroesophageal reflux disease)   . Grade II diastolic dysfunction   . Herpes zoster   . Hypothyroidism   . Mild aortic regurgitation   . Nutcracker esophagus   . Orthostatic hypotension   . Pneumonia   . Pre-diabetes   . Sleep apnea    does not use cpap, did not tolerate  . Vertigo   . Wears dentures    partial lower   Past Surgical History:  Procedure Laterality Date  . AUGMENTATION MAMMAPLASTY Bilateral   . BREAST EXCISIONAL BIOPSY Left   . CARDIAC CATHETERIZATION  2003   Dr.Callwood, R/L heart cath,  . COLONOSCOPY WITH PROPOFOL N/A 06/12/2017   Procedure: COLONOSCOPY WITH PROPOFOL;  Surgeon: Lucilla Lame, MD;  Location: [place];  Service: Endoscopy;  Laterality: N/A;  . dental work    . ESOPHAGEAL MANOMETRY N/A 06/19/2016   Procedure: ESOPHAGEAL MANOMETRY (EM);  Surgeon: Lucilla Lame, MD;  Location: [facility];  Service: Endoscopy;  Laterality: N/A;  . ESOPHAGOGASTRODUODENOSCOPY (EGD) WITH  PROPOFOL N/A 04/03/2017   Procedure: ESOPHAGOGASTRODUODENOSCOPY (EGD) WITH PROPOFOL;  Surgeon: Lucilla Lame, MD;  Location: [place];  Service: Endoscopy;  Laterality: N/A;  diabetic - diet controlled  . THYROIDECTOMY  05/15/15   [facility]  . TONSILLECTOMY     Social History   Socioeconomic History  . Marital status: Married    Spouse name: [name]  . Number of children: Not on file  . Years of education: Not on file  . Highest education level: Not on file  Occupational History  . Not on file  Tobacco Use  . Smoking status: Never Smoker  . Smokeless tobacco: Never Used  Vaping Use  . Vaping Use: Never used  Substance and Sexual Activity  . Alcohol use: Yes    Comment: wine once a week  . Drug use: No  . Sexual activity: Never  Other Topics Concern  . Not on file  Social History Narrative   Lives in [place].  Active but doesn't exercise. Retired from [employer].   Social Determinants of Health   Financial Resource Strain: Not on file  Food Insecurity: Not on file  Transportation Needs: Not on file  Physical Activity: Not on file  Stress: Not on file  Social Connections: Not on file   Family History  Problem Relation Age of Onset  . Heart attack Mother   . Alcohol abuse Mother   . Mental illness Mother  died in her 61's of alzheimer's Dementia  . Heart disease Mother 76  . Heart attack Father   . Heart disease Father 27       AMI - died @ 20.  . Colon cancer Paternal Grandmother 30   Allergies  Allergen Reactions  . Codeine Nausea And Vomiting  . Fentanyl Nausea And Vomiting  . Lipitor [Atorvastatin Calcium]     Muscle cramps and weakness  . Tetracyclines & Related Hives   Prior to Admission medications   Medication Sig Start Date End Date Taking? Authorizing Provider  ALPRAZolam Duanne Moron) 0.5 MG tablet Take 0.5 mg by mouth in the morning and at bedtime.   Yes [provider]  blood glucose meter kit and supplies KIT Dispense based on patient  and insurance preference. Use up to four times daily as directed. 08/03/20  Yes Crecencio Mc, MD  diltiazem (CARDIZEM CD) 120 MG 24 hr capsule Take 120 mg by mouth in the morning.   Yes [provider]  ezetimibe (ZETIA) 10 MG tablet Take 10 mg by mouth at bedtime.   Yes [provider]  furosemide (LASIX) 20 MG tablet Take 1 tablet (20 mg total) by mouth daily. Patient taking differently: Take 20 mg by mouth daily as needed (swelling/fluid retention). 09/26/20  Yes Loel Dubonnet, NP  glucose blood test strip For One touch Verio Flex .  Use to check 1 times daily e11.9 08/06/20  Yes Crecencio Mc, MD  hyoscyamine (LEVSIN SL) 0.125 MG SL tablet Place 0.125 mg under the tongue every 4 (four) hours as needed (abdominal pain/spasm (jackhammer esophagus)).   Yes [provider]  ibuprofen (ADVIL) 200 MG tablet Take 400 mg by mouth every 8 (eight) hours as needed (pain).   Yes [provider]  levothyroxine (SYNTHROID) 112 MCG tablet Take 112 mcg by mouth in the morning. 10/05/20  Yes [provider]  meclizine (ANTIVERT) 25 MG tablet Take 25 mg by mouth 3 (three) times daily as needed (vertigo/dizziness). 09/17/18  Yes [provider]  omeprazole (PRILOSEC) 40 MG capsule Take 40 mg by mouth in the morning and at bedtime.   Yes [provider]  rosuvastatin (CRESTOR) 20 MG tablet Take 1 tablet by mouth once daily Patient taking differently: Take 20 mg by mouth every other day. In the morning 10/17/20  Yes Crecencio Mc, MD  sertraline (ZOLOFT) 100 MG tablet Take 200 mg by mouth in the morning.   Yes [provider]     Positive ROS: All other systems have been reviewed and were otherwise negative with the exception of those mentioned in the HPI and as above.  Physical Exam: General: Alert, no acute distress Cardiovascular: Regular rate and rhythm, no murmurs rubs or gallops.  No pedal edema Respiratory: Clear to auscultation  bilaterally, no wheezes rales or rhonchi. No cyanosis, no use of accessory musculature GI: No organomegaly, abdomen is soft and non-tender nondistended with positive bowel sounds. Skin: Skin intact, no lesions within the operative field. Neurologic: Sensation intact distally Psychiatric: Patient is competent for consent with normal mood and affect Lymphatic: No cervical lymphadenopathy  MUSCULOSKELETAL: Right knee:  No erythema, ecchymosis or effusion of the knee.  Lower leg with chronic dried lesions without evidence (appears similar to eczema).  No signs of cellulitis such as swelling or erythema.  No lower leg edema.  Compartments soft.   ROM 0-120.  No ligamentous laxity.  + tenderness over the medial joint line.  +  patellofemoral crepitus.  NVI.  Assessment: Right Knee Osteoarthritis  Plan: Plan for Procedure(s): RIGHT TOTAL KNEE ARTHROPLASTY  I have reviewed the details of the right TKA operation and post-op course with the patient.  Her right leg was marked according to the hospital's correct site of surgery protocol.  A pre-op history and physical was performed at the bedside today.  I have reviewed her xrays and labs in preparation for this case.  She has no history of stroke, MI or DVT and is a candidate for use of tranexamic acid during surgery.    I discussed the risks and benefits of surgery. The risks include but are not limited to infection requiring the removal of the prosthesis.  We discussed the importance of avoiding itching or scratching her lower legs to prevent cellulitis which may lead to a septic knee.  She understands that bleeding requiring blood transfusion, nerve or blood vessel injury, joint stiffness or loss of motion, persistent pain, weakness or instability, loosening or failure of the prosthesis and the need for further surgery are also risks. Medical risks include but are not limited to DVT and pulmonary embolism, myocardial infarction, stroke, pneumonia,  respiratory failure and death. Patient understood these risks and wished to proceed.     Thornton Park, MD   10/25/2020 7:39 AM

## 2020-10-25 NOTE — Op Note (Signed)
DATE OF SURGERY:  10/25/2020 TIME: 11:58 AM  PATIENT NAME:  Megan Bradley   AGE: 80 y.o.    PRE-OPERATIVE DIAGNOSIS:  Right Knee Osteoarthritis  POST-OPERATIVE DIAGNOSIS:  Same  PROCEDURE:  Procedure(s): RIGHT TOTAL KNEE ARTHROPLASTY  SURGEON:  Thornton Park, MD   ASSISTANT:  Roland Rack, PA  OPERATIVE IMPLANTS: Depuy PFC Sigma, Posterior Stabilized Femural component size 2.5, Tibia size rotating platform component size 2.5, Patella polyethylene 3-peg oval button size 35, with a 10 mm polyethylene insert.  EBL:  25  TOURNIQUET TIME:  130 minutes  PREOPERATIVE INDICATIONS:  Megan Bradley is an 80 y.o. female who has a diagnosis of  Right Knee Osteoarthritis and elected for a right total knee arthroplasty after failing nonoperative treatment.  Their knee pain significantly impacts their activity of daily living.  Radiographs have demonstrated tricompartmental osteoarthritis joint space narrowing, osteophytes, and subchondral sclerosis.  The risks, benefits, and alternatives were discussed at length including but not limited to the risks of infection, bleeding, nerve or blood vessel injury, knee stiffness, fracture, dislocation, loosening or failure of the hardware and the need for further surgery. Medical risks include but not limited to DVT and pulmonary embolism, myocardial infarction, stroke, pneumonia, respiratory failure and death. I discussed these risks with the patient in my office prior to the date of surgery. They understood these risks and were willing to proceed.  OPERATIVE FINDINGS AND UNIQUE ASPECTS OF THE CASE: Tri -Compartmental osteoarthritis of the right knee most severe in the lateral compartment  OPERATIVE DESCRIPTION:  The patient was brought to the operative room and placed in a supine position after undergoing placement of a spinal anesthetic.  A Foley catheter was placed.  IV antibiotics were given. Patient received and clindamycin.  She also received tranexamic  acid prior to inflation of the tourniquet.  The lower extremity was prepped and draped in the usual sterile fashion.  A time out was performed to verify the patient's name, date of birth, medical record number, correct site of surgery and correct procedure to be performed. The timeout was also used to confirm the patient received antibiotics and that appropriate instruments, implants and radiographs studies were available in the room.  The leg was elevated and exsanguinated with an Esmarch and the tourniquet was inflated to 275 mmHg for 130 minutes..  A midline incision was made over the right knee. Full-thickness skin flaps were developed. A medial parapatellar arthrotomy was then made and the patella everted and the knee was brought into 90 of flexion. Hoffa's fat pad along with the cruciate ligaments and medial and lateral menisci were resected.   The distal femoral intramedullary canal was opened with a drill and the intramedullary distal femoral cutting jig was inserted into the femoral canal pinned into position. It was set at 5 degrees resecting 9 mm off the distal femur.  Care was taken to protect the collateral ligaments during distal femoral resection.  The distal femoral resection was performed with an oscillating saw. The femoral cutting guide was then removed.  The extramedullary tibial cutting guide was then placed using the anterior tibial crest and second ray of the foot as a references.  The tibial cutting guide was adjusted to allow for appropriate posterior slope.  The tibial cutting block was pinned into position. The slotted stylus was used to measure the proximal tibial resection of 10 mm off the high lateral side.  The tibial long rod alignment guide was then used to confirm position of the  cutting block. A third cross pin through the tibial cutting block was then drilled into position to allow for rotational stability. Care was taken during the tibial resection to protect the medial  and collateral ligaments.  The resected tibial bone was removed along with the posterior horns of the menisci.  The PCL was sacrificed.  Extension gap was measured with a spacer block and alignment and extension was confirmed using a long alignment rod.  The attention was then turned back to the femur. The posterior referencing distal femoral sizing guide was applied to the distal femur.  The femur was sized to be a size 2.5. Rotation of the referencing guide was checked with the epicondylar axis and Whitesides line. Then the 4-in-1 cutting jig was then applied to the distal femur. A stylus was used to confirm that the anterior femur would not be notched.   Then the anterior, posterior and chamfer femoral cuts were then made with an oscillating saw.  The flexion gap was then measured with a flexion spacer block and long alignment rod and was found to be symmetric with the extension gap and perpendicular to mechanical axis of the tibia.  The distal femoral preparation was completed by performing the posterior stabilized box cut using the cutting block. The entry site for the intramedullary femoral guide was filled with autologous bone graft from bone previously resected earlier in the case.  The proximal tibia plateau was then sized with trial trays. The best coverage was achieved with a size 2.5. This tibial tray was then pinned into position. The proximal tibia was then prepared with the reamer and keel punch.  After tibial preparation was completed, all trial components were inserted with polyethylene trials.  The knee was found to have excellent balance and full motion with a size 10 mm tibial polyethylene insert..    The attention was then turned to preparation of the patella. The thickness of the patella was measured with a caliper, the diameter measured with the patella templates to be a size 35.  Patient had some patella bone loss laterally and the patella button had to be slightly medialized for best  coverage.  The patella resection was then made with an oscillating saw using the patella cutting guide. 3 peg holes for the patella component were then drilled. The trial patella was then placed. Knee was taken through a full range of motion and deemed to be stable with the trial components. All trial components were then removed. The knee capsule was then injected with Exparel as well as a mixture of quarter percent Marcaine, Toradol and morphine to assist with postoperative pain relief.  The joint was copiously irrigated with pulse lavage.  The final total knee arthroplasty components were then cemented into place with a 10 mm trial polyethylene insert and all excess methylmethacrylate was removed.  The joint was again copiously irrigated. After the cement had hardened the knee was again taken through a full range of motion. It was felt to be most stable with the 10 mm tibial polyethylene insert. The actual tibial polyethylene insert was then placed.  The knee was taken through a range of motion and the patella tracked well and the knee was again irrigated copiously.    The medial arthrotomy was closed with #1 Ethibond. The subcutaneous tissue closed with 0 and 2-0 vicryl, and skin approximated with staples.  A dry sterile and compressive dressing was applied.  A Polar Care was applied to the operative knee along with a  knee immobilizer.  The patient was awakened and brought to the PACU in stable and satisfactory condition.  All sharp, lap and instrument counts were correct at the conclusion the case. I spoke with the patient's husband in the postop consultation room to let him know the case had been performed without complication and the patient was stable in recovery room.

## 2020-10-26 ENCOUNTER — Encounter: Payer: Self-pay | Admitting: Orthopedic Surgery

## 2020-10-26 LAB — BASIC METABOLIC PANEL
Anion gap: 6 (ref 5–15)
BUN: 20 mg/dL (ref 8–23)
CO2: 25 mmol/L (ref 22–32)
Calcium: 8.5 mg/dL — ABNORMAL LOW (ref 8.9–10.3)
Chloride: 110 mmol/L (ref 98–111)
Creatinine, Ser: 0.9 mg/dL (ref 0.44–1.00)
GFR, Estimated: >60 mL/min (ref >60)
Glucose, Bld: 140 mg/dL — ABNORMAL HIGH (ref 70–99)
Potassium: 3.8 mmol/L (ref 3.5–5.1)
Sodium: 141 mmol/L (ref 135–145)

## 2020-10-26 LAB — CBC
HCT: 26.8 % — ABNORMAL LOW (ref 36.0–46.0)
Hemoglobin: 8.9 g/dL — ABNORMAL LOW (ref 12.0–15.0)
MCH: 30.7 pg (ref 26.0–34.0)
MCHC: 33.2 g/dL (ref 30.0–36.0)
MCV: 92.4 fL (ref 80.0–100.0)
Platelets: 111 10*3/uL — ABNORMAL LOW (ref 150–400)
RBC: 2.9 MIL/uL — ABNORMAL LOW (ref 3.87–5.11)
RDW: 16.8 % — ABNORMAL HIGH (ref 11.5–15.5)
WBC: 8.6 10*3/uL (ref 4.0–10.5)
nRBC: 0 % (ref 0.0–0.2)

## 2020-10-26 LAB — GLUCOSE, CAPILLARY: Glucose-Capillary: 130 mg/dL — ABNORMAL HIGH (ref 70–99)

## 2020-10-26 MED ORDER — HYDROCODONE-ACETAMINOPHEN 5-325 MG PO TABS
1.0000 | ORAL_TABLET | ORAL | Status: DC | PRN
  Filled 2020-10-26: qty 2

## 2020-10-26 NOTE — TOC Progression Note (Signed)
Transition of Care [facility]) - Progression Note    Patient Details  Name: Megan Bradley MRN: [id number] Date of Birth: 02/22/1941  Transition of Care [facility]) CM/SW [place], RN Phone Number: 10/26/2020, 1:33 PM  Clinical Narrative:  The patient stated that her husband is not always home and she will be by her self, she would like to go to SNF, PASSR obtained [phone] A, , FL2 completed, Bedsearch done, will need Ins auth once bed is found       Expected Discharge Plan and Services                                                 Social Determinants of Health (SDOH) Interventions    Readmission Risk Interventions No flowsheet data found.

## 2020-10-26 NOTE — Evaluation (Signed)
Physical Therapy Evaluation Patient Details Name: Megan Bradley MRN: [id number] DOB: [birth year] Today's Date: 10/26/2020   History of Present Illness  Pt is a 80 y/o F who underwent scheduled R TKA on 10/25/20 by Dr. Mack Guise. PMH: anxiety, arthritis, cervical dysplasia, DOE, GERD, hypothyroidism, orthostatic hypotension, pre-diabetes, sleep apnea (does not use cpap), vertigo  Clinical Impression  Pt seen for PT evaluation with husband present for session. Pt reports prior to admission she was independent without AD, with poor balance & had received 2 PT sessions for balance training, but only 1 fall in the past 6 months (did result in concussion). Pt also endorses feeling somewhat altered 2/2 pain medication (she required assistance for consuming breakfast 2/2 impaired depth perception) & nurse made aware. PT provided pt with HEP handout & pt performs RLE strengthening/ROM exercises with instructional cuing for technique. Pt is able to complete bed mobility without physical assistance but does require min assist for static sitting EOB 2/2 impaired balance (L/posterior lean) with husband reporting her current balance is comparable to her baseline. Pt requires mod assist sit>stand & mod/max assist for taking steps to R to recliner as pt demonstrates significant posterior lean but pt reports she's unaware of this. Pt would benefit from 2nd person to provide w/c follow for safe gait during next session, if available. Will continue to follow pt acutely to progress mobility as able.     Follow Up Recommendations Home health PT;Supervision/Assistance - 24 hour    Equipment Recommendations  Rolling walker with 5" wheels;3in1 (PT)    Recommendations for Other Services       Precautions / Restrictions Precautions Precautions: Fall;Knee Required Braces or Orthoses: Knee Immobilizer - Right Knee Immobilizer - Right: On at all times;On except when in CPM;On when out of bed or walking;Other (comment) (during  PT) Restrictions Weight Bearing Restrictions: Yes RLE Weight Bearing: Weight bearing as tolerated      Mobility  Bed Mobility Overal bed mobility: Needs Assistance Bed Mobility: Supine to Sit     Supine to sit: Supervision;HOB elevated     General bed mobility comments: HOB significantly elevated, use of bed rails    Transfers Overall transfer level: Needs assistance Equipment used: Rolling walker (2 wheeled) Transfers: Sit to/from Stand Sit to Stand: Mod assist         General transfer comment: cuing for safe hand placement when using RW  Ambulation/Gait Ambulation/Gait assistance: Mod assist;Max assist Gait Distance (Feet): 3 Feet Assistive device: Rolling walker (2 wheeled) Gait Pattern/deviations: Decreased step length - right;Decreased step length - left;Decreased stride length;Decreased dorsiflexion - right;Decreased weight shift to right Gait velocity: decreased   General Gait Details: pt takes a few side steps to R then turns to sit in recliner, PT provides cuing to use RW for support & cuing to push down on RW to correct significant posterior lean  Stairs            Wheelchair Mobility    Modified Rankin (Stroke Patients Only)       Balance Overall balance assessment: Needs assistance Sitting-balance support: Feet supported;Bilateral upper extremity supported Sitting balance-Leahy Scale: Poor Sitting balance - Comments: min assist with pt demonstrating worsening balance the longer she sits EOB; pt with posterior/L LOB Postural control: Posterior lean Standing balance support: During functional activity;Bilateral upper extremity supported Standing balance-Leahy Scale: Zero Standing balance comment: strong posterior lean with pt reporting she's unaware of impaired balance, requires cuing to push RW down into floor to correct posterior lean, mod/max  assist from PT                             Pertinent Vitals/Pain Pain Assessment:  0-10 Pain Score: 7  Pain Location: R knee Pain Descriptors / Indicators: Aching;Discomfort;Grimacing Pain Intervention(s): Limited activity within patient's tolerance;Monitored during session;Premedicated before session (pt premedicated prior to PT session & due to this, endorses impaired depth perception & required assistance with eating breakfast, also very tired during session - nurse made aware)    Home Living Family/patient expects to be discharged to:: Private residence Living Arrangements: Spouse/significant other Available Help at Discharge: Family;Available 24 hours/day Type of Home: House Home Access: Level entry     Home Layout: Multi-level;Able to live on main level with bedroom/bathroom Home Equipment: None      Prior Function Level of Independence: Independent         Comments: Independent without AD, driving. Pt reports 1 fall with concussion when attempting to hang wreath this past December. But does endorse poor balance at baseline (in sitting & standing), has completed 2 PT appointments for balance training.     Hand Dominance        Extremity/Trunk Assessment   Upper Extremity Assessment Upper Extremity Assessment: Generalized weakness    Lower Extremity Assessment Lower Extremity Assessment:  (3-/5 R knee extension in sitting)       Communication   Communication: No difficulties  Cognition Arousal/Alertness: Awake/alert Behavior During Therapy: WFL for tasks assessed/performed Overall Cognitive Status: Within Functional Limits for tasks assessed                                        General Comments General comments (skin integrity, edema, etc.): Pt on 1.5 L/min via nasal cannula, SPO2 >90% throughout session, after transferring to recliner BP 130/62 mmHg in LUE (MAP 80)    Exercises Total Joint Exercises Ankle Circles/Pumps: AROM;Right;10 reps;Supine Quad Sets: AROM;Strengthening;Right;10 reps;Supine Short Arc Quad:  AROM;Strengthening;Right;10 reps;Supine Heel Slides: AROM;Strengthening;10 reps;Right;Supine Hip ABduction/ADduction: AROM;Strengthening;Right;10 reps;Supine (hip abduction slides) Straight Leg Raises: AROM;Strengthening;Right;10 reps;Supine Long Arc Quad: AROM;Strengthening;Right;10 reps;Seated Knee Flexion: AROM;Strengthening;Right;10 reps;Seated Goniometric ROM: 16 degrees from full extension in supine; 91 degrees flexion in sitting   Assessment/Plan    PT Assessment Patient needs continued PT services  PT Problem List Decreased strength;Decreased mobility;Decreased safety awareness;Decreased range of motion;Decreased activity tolerance;Cardiopulmonary status limiting activity;Decreased balance;Decreased knowledge of use of DME;Pain       PT Treatment Interventions DME instruction;Therapeutic activities;Gait training;Therapeutic exercise;Patient/family education;Modalities;Stair training;Balance training;Manual techniques;Neuromuscular re-education;Functional mobility training    PT Goals (Current goals can be found in the Care Plan section)  Acute Rehab PT Goals Patient Stated Goal: go home & do therapy PT Goal Formulation: With patient Time For Goal Achievement: 11/09/20 Potential to Achieve Goals: Good    Frequency BID   Barriers to discharge        Co-evaluation               AM-PAC PT "6 Clicks" Mobility  Outcome Measure Help needed turning from your back to your side while in a flat bed without using bedrails?: A Little Help needed moving from lying on your back to sitting on the side of a flat bed without using bedrails?: A Little Help needed moving to and from a bed to a chair (including a wheelchair)?: A Lot Help needed standing up from  a chair using your arms (e.g., wheelchair or bedside chair)?: A Lot Help needed to walk in hospital room?: Total Help needed climbing 3-5 steps with a railing? : Total 6 Click Score: 12    End of Session Equipment Utilized  During Treatment: Gait belt;Oxygen (R knee immobilizer doffed/donned at beginning & end of session) Activity Tolerance: Patient limited by pain;Patient limited by lethargy;Patient limited by fatigue;Other (comment) (generally not feeling well) Patient left: in chair;with call bell/phone within reach;with family/visitor present;with SCD's reapplied (R knee immobilizer & polar care donned) Nurse Communication:  (effects of pain meds on pt (as pt reports them)) PT Visit Diagnosis: Unsteadiness on feet (R26.81);Muscle weakness (generalized) (M62.81);Difficulty in walking, not elsewhere classified (R26.2)    Time: 4401-0272 PT Time Calculation (min) (ACUTE ONLY): 40 min   Charges:   PT Evaluation $PT Eval Low Complexity: 1 Low PT Treatments $Therapeutic Exercise: 8-22 mins $Therapeutic Activity: 8-22 mins        Lavone Nian, PT, DPT 10/26/20, 10:47 AM   Waunita Schooner 10/26/2020, 10:44 AM

## 2020-10-26 NOTE — Evaluation (Signed)
Occupational Therapy Evaluation Patient Details Name: Megan Bradley MRN: [id number] DOB: [birth year] Today's Date: 10/26/2020    History of Present Illness Pt is a 80 y/o F who underwent scheduled R TKA on 10/25/20 by Dr. Mack Guise. PMH: anxiety, arthritis, cervical dysplasia, DOE, GERD, hypothyroidism, orthostatic hypotension, pre-diabetes, sleep apnea (does not use cpap), vertigo   Clinical Impression   Patient presenting with decreased I in self care, balance, functional mobility/transfers, endurance and safety awareness.  Patient reports living at home with husband PTA. Pt reports being independent without use of AD for mobility and self care. She still drives.Pt is lethargic this session but becomes more alert as session continues. OT reviewed polar care and donning/doffing of KI with husband. Pt then standing with max A needed from recliner chair. Mod A to take R side steps to [facility]. Pt needing mod - max multimodal cuing for technique and hand placement. Pt also needed assistance to manager RW. Pt having difficulty bearing weight on R LE and reports 10/10 pain with mobility. Patient will benefit from acute OT to increase overall independence in the areas of ADLs, functional mobility, and safety awareness in order to safely discharge to next venue of care.    Follow Up Recommendations  SNF;Supervision/Assistance - 24 hour    Equipment Recommendations  3 in 1 bedside commode       Precautions / Restrictions Precautions Precautions: Fall;Knee Required Braces or Orthoses: Knee Immobilizer - Right Knee Immobilizer - Right: On at all times;On except when in CPM;On when out of bed or walking;Other (comment) Restrictions Weight Bearing Restrictions: Yes RLE Weight Bearing: Weight bearing as tolerated      Mobility Bed Mobility Overal bed mobility: Needs Assistance Bed Mobility: Supine to Sit     Supine to sit: Supervision;HOB elevated     General bed mobility comments: seated in recliner  chair    Transfers Overall transfer level: Needs assistance Equipment used: Rolling walker (2 wheeled) Transfers: Sit to/from [facility] Sit to Stand: Max assist Stand pivot transfers: Mod assist       General transfer comment: cuing for technique and hand placement and assistance with RW management.    Balance Overall balance assessment: Needs assistance Sitting-balance support: Feet supported;Bilateral upper extremity supported Sitting balance-Leahy Scale: Fair Sitting balance - Comments: min assist with pt demonstrating worsening balance the longer she sits EOB; pt with posterior/L LOB Postural control: Posterior lean Standing balance support: During functional activity;Bilateral upper extremity supported Standing balance-Leahy Scale: Zero Standing balance comment: multiple posterior LOB                           ADL either performed or assessed with clinical judgement   ADL Overall ADL's : Needs assistance/impaired                         Toilet Transfer: Maximal assistance;RW;BSC Toilet Transfer Details (indicate cue type and reason): posterior bias in standing Toileting- Clothing Manipulation and Hygiene: Maximal assistance       Functional mobility during ADLs: Moderate assistance       Vision Patient Visual Report: No change from baseline              Pertinent Vitals/Pain Pain Assessment: 0-10 Pain Score: 10-Worst pain ever Pain Location: R knee Pain Descriptors / Indicators: Aching;Discomfort;Grimacing Pain Intervention(s): Limited activity within patient's tolerance;Monitored during session;Premedicated before session     Hand Dominance Right  Extremity/Trunk Assessment Upper Extremity Assessment Upper Extremity Assessment: Generalized weakness   Lower Extremity Assessment Lower Extremity Assessment: Defer to PT evaluation       Communication Communication Communication: No difficulties   Cognition  Arousal/Alertness: Lethargic;Suspect due to medications Behavior During Therapy: [facility] for tasks assessed/performed Overall Cognitive Status: Within Functional Limits for tasks assessed                                     General Comments  Pt on 1.5 L/min via nasal cannula, SPO2 >90% throughout session, after transferring to recliner BP 130/62 mmHg in LUE (MAP 80)       Shoulder Instructions      Home Living Family/patient expects to be discharged to:: Private residence Living Arrangements: Spouse/significant other Available Help at Discharge: Family;Available 24 hours/day Type of Home: House Home Access: Level entry     Home Layout: Multi-level;Able to live on main level with bedroom/bathroom Alternate Level Stairs-Number of Steps: bonus room & extra bedroom upstairs   Bathroom Shower/Tub: Walk-in shower         Home Equipment: None          Prior Functioning/Environment Level of Independence: Independent        Comments: Independent without AD, driving. Pt reports 1 fall with concussion when attempting to hang wreath this past December. But does endorse poor balance at baseline (in sitting & standing), has completed 2 PT appointments for balance training.        OT Problem List: Decreased strength;Impaired balance (sitting and/or standing);Decreased cognition;Decreased knowledge of precautions;Pain;Decreased safety awareness;Decreased knowledge of use of DME or AE;Decreased activity tolerance      OT Treatment/Interventions: Self-care/ADL training;Manual therapy;Therapeutic exercise;Patient/family education;Modalities;Balance training;Energy conservation;Therapeutic activities;DME and/or AE instruction    OT Goals(Current goals can be found in the care plan section) Acute Rehab OT Goals Patient Stated Goal: to move better and decrease pain OT Goal Formulation: With patient/family Time For Goal Achievement: 11/09/20 Potential to Achieve Goals:  Good ADL Goals Pt Will Perform Grooming: with supervision;standing Pt Will Perform Lower Body Dressing: with min assist;sit to/from stand Pt Will Transfer to Toilet: with min assist;ambulating Pt Will Perform Toileting - Clothing Manipulation and hygiene: with min assist;sit to/from stand  OT Frequency: Min 2X/week    AM-PAC OT "6 Clicks" Daily Activity     Outcome Measure Help from another person eating meals?: None Help from another person taking care of personal grooming?: A Little Help from another person toileting, which includes using toliet, bedpan, or urinal?: A Lot Help from another person bathing (including washing, rinsing, drying)?: A Lot Help from another person to put on and taking off regular upper body clothing?: A Little Help from another person to put on and taking off regular lower body clothing?: A Lot 6 Click Score: 16   End of Session Equipment Utilized During Treatment: Rolling walker Nurse Communication: Mobility status  Activity Tolerance: Patient tolerated treatment well Patient left: in chair;with call bell/phone within reach;with bed alarm set;with family/visitor present  OT Visit Diagnosis: Unsteadiness on feet (R26.81);Muscle weakness (generalized) (M62.81)                Time: 1610-9604 OT Time Calculation (min): 43 min Charges:  OT General Charges $OT Visit: 1 Visit OT Evaluation $OT Eval Moderate Complexity: 1 Mod OT Treatments $Self Care/Home Management : 38-52 mins   Darleen Crocker, MS, OTR/L , CBIS ascom [phone]  10/26/20, 1:02 PM   10/26/2020, 1:01 PM

## 2020-10-26 NOTE — Progress Notes (Addendum)
Physical Therapy Treatment Patient Details Name: Megan Bradley MRN: [id number] DOB: 1940-08-01 Today's Date: 10/26/2020    History of Present Illness Pt is a 80 y/o F who underwent scheduled R TKA on 10/25/20 by Dr. Mack Guise. PMH: anxiety, arthritis, cervical dysplasia, DOE, GERD, hypothyroidism, orthostatic hypotension, pre-diabetes, sleep apnea (does not use cpap), vertigo    PT Comments    Pt seen for PT treatment. Pt initially declines participation 2/2 R knee pain but with encouragement pt agreeable to gentle RLE exercises. PT doffs/dons R KI during session. Pt performs ankle pumps, quad sets, & heel slides to tolerance with cuing to attend to task as pt closes eyes during activity. Pt educated on use & frequency of use of incentive spirometer with pt return demonstrating. Discussed d/c recommendations with PT educating pt on change in recommendation to STR 2/2 slow progress & current need for increased assistance with functional mobility. Pt would benefit from STR upon d/c to maximize independence with functional mobility & reduce fall risk prior to return home. Will continue to follow pt acutely to progress independence with mobility as able.     Follow Up Recommendations  SNF     Equipment Recommendations   (TBD in next venue)    Recommendations for Other Services       Precautions / Restrictions Precautions Precautions: Fall;Knee Required Braces or Orthoses: Knee Immobilizer - Right Knee Immobilizer - Right: On at all times;On except when in CPM;On when out of bed or walking;Other (comment) (during PT) Restrictions Weight Bearing Restrictions: Yes RLE Weight Bearing: Weight bearing as tolerated    Mobility  Bed Mobility     Stairs             Wheelchair Mobility    Modified Rankin (Stroke Patients Only)       Balance                            Cognition Arousal/Alertness: Lethargic;Suspect due to medications Behavior During Therapy: [facility] for  tasks assessed/performed Overall Cognitive Status: Within Functional Limits for tasks assessed                                        Exercises Total Joint Exercises Ankle Circles/Pumps: AROM;Right;10 reps;Supine Quad Sets: AROM;Strengthening;Right;10 reps;Supine Heel Slides: AROM;Strengthening;10 reps;Right;Supine     General Comments General comments (skin integrity, edema, etc.): Pt on 1.5 L/min via nasal cannula, SPO2 >90% throughout session      Pertinent Vitals/Pain Pain Assessment: 0-10 scale Pain Score: 10-Worst pain ever Pain Location: R knee Pain Descriptors / Indicators: Aching;Discomfort;Grimacing Pain Intervention(s): Monitored during session    Home Living Family/patient expects to be discharged to:: Private residence Living Arrangements: Spouse/significant other Available Help at Discharge: Family;Available 24 hours/day Type of Home: House Home Access: Level entry   Home Layout: Multi-level;Able to live on main level with bedroom/bathroom Home Equipment: None      Prior Function Level of Independence: Independent         PT Goals (current goals can now be found in the care plan section) Acute Rehab PT Goals Patient Stated Goal: to move better and decrease pain PT Goal Formulation: With patient Time For Goal Achievement: 11/09/20 Potential to Achieve Goals: Fair Progress towards PT goals: Progressing toward goals    Frequency    BID      PT Plan Discharge  plan needs to be updated    Co-evaluation              AM-PAC PT "6 Clicks" Mobility   Outcome Measure  Help needed turning from your back to your side while in a flat bed without using bedrails?: A Little Help needed moving from lying on your back to sitting on the side of a flat bed without using bedrails?: A Little Help needed moving to and from a bed to a chair (including a wheelchair)?: A Lot Help needed standing up from a chair using your arms (e.g., wheelchair  or bedside chair)?: A Lot Help needed to walk in hospital room?: Total Help needed climbing 3-5 steps with a railing? : Total 6 Click Score: 12    End of Session Equipment Utilized During Treatment: Right knee immobilizer;Oxygen Activity Tolerance: Patient limited by fatigue;Patient limited by lethargy;Patient limited by pain Patient left: in bed;with call bell/phone within reach;with bed alarm set;with SCD's reapplied (R knee immobilizer & polar care donned) Nurse Communication:  (effects of pain meds on pt (as pt reports them)) PT Visit Diagnosis: Unsteadiness on feet (R26.81);Muscle weakness (generalized) (M62.81);Difficulty in walking, not elsewhere classified (R26.2)     Time: 1779-3903 PT Time Calculation (min) (ACUTE ONLY): 11 min  Charges:   $Therapeutic Activity: 8-22 mins                     Lavone Nian, PT, DPT 10/26/20, 1:40 PM    Waunita Schooner 10/26/2020, 1:37 PM

## 2020-10-26 NOTE — Progress Notes (Signed)
Subjective:  POD #1 s/p right TKA.   Patient reports right knee pain as marked.  Patient OOB to a chair.  Her husband and RN are in the room.  Patient states she did not have significant pain overnight and was able to sleep but had significant increase in pain after PT this AM.  Objective:   VITALS:   Vitals:   10/26/20 0837 10/26/20 0847 10/26/20 1043 10/26/20 1153  BP: (!) 112/58  (!) 124/53 130/73  Pulse: 64  68 67  Resp:    15  Temp: 98.2 F (36.8 C)  98.6 F (37 C) 98.1 F (36.7 C)  TempSrc: Oral  Oral   SpO2: 95% 100% 94% 95%  Weight:      Height:        PHYSICAL EXAM: Right lower extremity: Neurovascular intact Sensation intact distally Intact pulses distally Dorsiflexion/Plantar flexion intact Incision: dressing C/D/I No cellulitis present Compartment soft  LABS  Results for orders placed or performed during the hospital encounter of 10/25/20 (from the past 24 hour(s))  CBC     Status: Abnormal   Collection Time: 10/26/20  4:35 AM  Result Value Ref Range   WBC 8.6 4.0 - 10.5 K/uL   RBC 2.90 (L) 3.87 - 5.11 MIL/uL   Hemoglobin 8.9 (L) 12.0 - 15.0 g/dL   HCT 26.8 (L) 36.0 - 46.0 %   MCV 92.4 80.0 - 100.0 fL   MCH 30.7 26.0 - 34.0 pg   MCHC 33.2 30.0 - 36.0 g/dL   RDW 16.8 (H) 11.5 - 15.5 %   Platelets 111 (L) 150 - 400 K/uL   nRBC 0.0 0.0 - 0.2 %  Basic metabolic panel     Status: Abnormal   Collection Time: 10/26/20  4:35 AM  Result Value Ref Range   Sodium 141 135 - 145 mmol/L   Potassium 3.8 3.5 - 5.1 mmol/L   Chloride 110 98 - 111 mmol/L   CO2 25 22 - 32 mmol/L   Glucose, Bld 140 (H) 70 - 99 mg/dL   BUN 20 8 - 23 mg/dL   Creatinine, Ser 0.90 0.44 - 1.00 mg/dL   Calcium 8.5 (L) 8.9 - 10.3 mg/dL   GFR, Estimated >60 >60 mL/min   Anion gap 6 5 - 15    DG Knee 1-2 Views Right  Result Date: 10/25/2020 CLINICAL DATA:  Status post total knee replacement EXAM: RIGHT KNEE - 1-2 VIEW COMPARISON:  None. FINDINGS: Frontal and lateral views were  obtained. Patient is status post total knee replacement with prosthetic components well-seated. No fracture or dislocation. Air within the joint is noted. There are overlying skin staples. IMPRESSION: Status post total knee replacement with prosthetic components well-seated. No fracture or dislocation. Acute postoperative changes noted. Electronically Signed   By: Lowella Grip III M.D.   On: 10/25/2020 12:36    Assessment/Plan: 1 Day Post-Op   Active Problems:   S/P TKR (total knee replacement) using cement, right   Patient with increased pain today.  I recommended to RN that patient get a does of 0.5 mgdilaudid for marked pain.  Patient states she had some vision changes during breakfast after taking oxycodone 10mg .   I will switch oxycodone to norco.  Continue PT as tolerated.   Begin lovenox for DVT prophylaxis today.  Foley removed.  Recheck labs in AM.  Hemoglobin and hematocrit are within acceptable limits today.  Continue TEDs and foot pumps.   Thornton Park , MD 10/26/2020, 12:21 PM

## 2020-10-26 NOTE — NC FL2 (Signed)
[place] LEVEL OF CARE SCREENING TOOL     IDENTIFICATION  Patient Name: Megan Bradley Birthdate: 08-Aug-1940 Sex: female Admission Date (Current Location): 10/25/2020  [place] and [place] Number:  Engineering geologist and Address:  [facility], [address], [place], [place] [address]      Provider Number: [id number]  Attending Physician Name and Address:  Thornton Park, MD  Relative Name and Phone Number:  [name] husband [phone]    Current Level of Care: Hospital Recommended Level of Care: [place] Prior Approval Number:    Date Approved/Denied:   PASRR Number: [id number]  Discharge Plan: SNF    Current Diagnoses: Patient Active Problem List   Diagnosis Date Noted  . S/P TKR (total knee replacement) using cement, right 10/25/2020  . Preoperative clearance 09/18/2020  . Aortic atherosclerosis ([place]) 07/10/2020  . Exertional dyspnea 05/20/2020  . Balance problem 05/12/2020  . History of recent fall 05/12/2020  . Low back pain 12/02/2019  . Cellulitis of right anterior lower leg 10/18/2019  . Breast disorder in female 10/14/2019  . Ruptured left breast implant, initial encounter 07/05/2018  . Ruptured silicone breast implant 07/05/2018  . Change in bowel habits 07/04/2018  . Bilateral chronic knee pain 04/11/2018  . S/P breast implant, silicone 93/90/3009  . Osteoporosis of femur without pathological fracture 03/11/2018  . 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 Tubular adenoma of colon   . Squamous cell skin cancer 07/21/2016  . Hot flushes, perimenopausal 01/05/2016  . RBBB   . Chest pain   . Shoulder pain, left 04/10/2015  . Hypovitaminosis D 01/11/2013  . Deviated septum 01/11/2013  . History of pernicious anemia 01/11/2013  . Fatigue after COVID-19 vaccination 06/09/2012  . Hyperlipidemia associated with type 2 diabetes mellitus ([place]) 09/04/2011  . Abnormal cells of cervix 08/27/2011  .  Multinodular goiter 08/27/2011  . Hyperlipidemia 08/27/2011  . Bilateral lower extremity edema 08/25/2011  . Screening for breast cancer 03/13/2011  . Iatrogenic hypothyroidism   . Diabetes mellitus without complication ([place])   . GERD (gastroesophageal reflux disease)   . Anxiety with obsessional features   . Basal cell carcinoma of face 10/03/2010    Orientation RESPIRATION BLADDER Height & Weight     Self,Time,Situation,Place  Normal Continent Weight: 68.3 kg Height:  5\' 5"  (165.1 cm)  BEHAVIORAL SYMPTOMS/MOOD NEUROLOGICAL BOWEL NUTRITION STATUS      Continent Diet (regular)  AMBULATORY STATUS COMMUNICATION OF NEEDS Skin   Extensive Assist Verbally Surgical wounds                       Personal Care Assistance Level of Assistance  Bathing,Dressing Bathing Assistance: Limited assistance   Dressing Assistance: Limited assistance     Functional Limitations Info             SPECIAL CARE FACTORS FREQUENCY  PT (By licensed PT)     PT Frequency: 5 times per week              Contractures Contractures Info: Not present    Additional Factors Info  Code Status,Allergies Code Status Info: full code Allergies Info: Codeine, Fentanyl, Lipitor Atorvastatin Calcium, Tetracyclines and Related           Current Medications (10/26/2020):  This is the current hospital active medication list Current Facility-Administered Medications  Medication Dose Route Frequency Provider Last Rate Last Admin  . acetaminophen (TYLENOL) tablet 325-650 mg  325-650 mg Oral Q6H PRN Thornton Park,  MD      . ALPRAZolam Duanne Moron) tablet 0.5 mg  0.5 mg Oral BID Thornton Park, MD   0.5 mg at 10/26/20 0855  . bisacodyl (DULCOLAX) suppository 10 mg  10 mg Rectal Daily PRN Thornton Park, MD      . diltiazem (CARDIZEM CD) 24 hr capsule 120 mg  120 mg Oral q AM Thornton Park, MD      . diphenhydrAMINE (BENADRYL) 12.5 MG/5ML elixir 12.5-25 mg  12.5-25 mg Oral Q4H PRN Thornton Park,  MD      . docusate sodium (COLACE) capsule 100 mg  100 mg Oral BID Thornton Park, MD   100 mg at 10/26/20 0855  . enoxaparin (LOVENOX) injection 40 mg  40 mg Subcutaneous Q24H Thornton Park, MD   40 mg at 10/26/20 0853  . ezetimibe (ZETIA) tablet 10 mg  10 mg Oral QHS Thornton Park, MD   10 mg at 10/25/20 2228  . furosemide (LASIX) tablet 20 mg  20 mg Oral Daily PRN Thornton Park, MD      . HYDROcodone-acetaminophen (NORCO/VICODIN) 5-325 MG per tablet 1-2 tablet  1-2 tablet Oral Q4H PRN Thornton Park, MD      . HYDROmorphone (DILAUDID) injection 0.5-1 mg  0.5-1 mg Intravenous Q4H PRN Thornton Park, MD   0.5 mg at 10/26/20 1256  . hyoscyamine (LEVSIN SL) SL tablet 0.125 mg  0.125 mg Sublingual Q4H PRN Thornton Park, MD      . levothyroxine (SYNTHROID) tablet 112 mcg  112 mcg Oral q AM Thornton Park, MD   112 mcg at 10/26/20 0636  . meclizine (ANTIVERT) tablet 25 mg  25 mg Oral TID PRN Thornton Park, MD      . menthol-cetylpyridinium (CEPACOL) lozenge 3 mg  1 lozenge Oral PRN Thornton Park, MD       Or  . phenol (CHLORASEPTIC) mouth spray 1 spray  1 spray Mouth/Throat PRN Thornton Park, MD      . methocarbamol (ROBAXIN) tablet 500 mg  500 mg Oral Q6H PRN Thornton Park, MD   500 mg at 10/25/20 2229   Or  . methocarbamol (ROBAXIN) 500 mg in dextrose 5 % 50 mL IVPB  500 mg Intravenous Q6H PRN Thornton Park, MD      . ondansetron [facility]) tablet 4 mg  4 mg Oral Q6H PRN Thornton Park, MD       Or  . ondansetron [facility]) injection 4 mg  4 mg Intravenous Q6H PRN Thornton Park, MD      . pantoprazole (PROTONIX) EC tablet 40 mg  40 mg Oral QAC breakfast Thornton Park, MD   40 mg at 10/26/20 0855  . rosuvastatin (CRESTOR) tablet 20 mg  20 mg Oral Ivor Messier, MD   20 mg at 10/26/20 0854  . senna-docusate (Senokot-S) tablet 1 tablet  1 tablet Oral QHS PRN Thornton Park, MD      . sertraline (ZOLOFT) tablet 200 mg  200 mg Oral q AM Thornton Park, MD   200 mg at 10/26/20 0854  . traMADol (ULTRAM) tablet 50 mg  50 mg Oral Q6H Thornton Park, MD   50 mg at 10/26/20 1250   Facility-Administered Medications Ordered in Other Encounters  Medication Dose Route Frequency Provider Last Rate Last Admin  . clindamycin (CLEOCIN) IVPB 600 mg  600 mg Intravenous Once Thornton Park, MD         Discharge Medications: Please see discharge summary for a list of discharge medications.  Relevant Imaging Results:  Relevant Lab  Results:   Additional Information SS# [id number]  Su Hilt, RN

## 2020-10-27 DIAGNOSIS — E890 Postprocedural hypothyroidism: Secondary | ICD-10-CM | POA: Diagnosis present

## 2020-10-27 DIAGNOSIS — Z96659 Presence of unspecified artificial knee joint: Secondary | ICD-10-CM

## 2020-10-27 DIAGNOSIS — F419 Anxiety disorder, unspecified: Secondary | ICD-10-CM | POA: Diagnosis present

## 2020-10-27 DIAGNOSIS — I700 Atherosclerosis of aorta: Secondary | ICD-10-CM | POA: Diagnosis present

## 2020-10-27 DIAGNOSIS — Z820 Family history of epilepsy and other diseases of the nervous system: Secondary | ICD-10-CM | POA: Diagnosis not present

## 2020-10-27 DIAGNOSIS — G4730 Sleep apnea, unspecified: Secondary | ICD-10-CM | POA: Diagnosis present

## 2020-10-27 DIAGNOSIS — K219 Gastro-esophageal reflux disease without esophagitis: Secondary | ICD-10-CM | POA: Diagnosis present

## 2020-10-27 DIAGNOSIS — R410 Disorientation, unspecified: Secondary | ICD-10-CM | POA: Diagnosis not present

## 2020-10-27 DIAGNOSIS — Z79899 Other long term (current) drug therapy: Secondary | ICD-10-CM | POA: Diagnosis not present

## 2020-10-27 DIAGNOSIS — M1711 Unilateral primary osteoarthritis, right knee: Secondary | ICD-10-CM | POA: Diagnosis present

## 2020-10-27 DIAGNOSIS — R531 Weakness: Secondary | ICD-10-CM | POA: Diagnosis not present

## 2020-10-27 DIAGNOSIS — Z881 Allergy status to other antibiotic agents status: Secondary | ICD-10-CM | POA: Diagnosis not present

## 2020-10-27 DIAGNOSIS — Z885 Allergy status to narcotic agent status: Secondary | ICD-10-CM | POA: Diagnosis not present

## 2020-10-27 DIAGNOSIS — R42 Dizziness and giddiness: Secondary | ICD-10-CM | POA: Diagnosis not present

## 2020-10-27 DIAGNOSIS — Z8249 Family history of ischemic heart disease and other diseases of the circulatory system: Secondary | ICD-10-CM | POA: Diagnosis not present

## 2020-10-27 DIAGNOSIS — R7303 Prediabetes: Secondary | ICD-10-CM | POA: Diagnosis present

## 2020-10-27 DIAGNOSIS — Z79890 Hormone replacement therapy (postmenopausal): Secondary | ICD-10-CM | POA: Diagnosis not present

## 2020-10-27 DIAGNOSIS — Z96651 Presence of right artificial knee joint: Secondary | ICD-10-CM

## 2020-10-27 DIAGNOSIS — Z888 Allergy status to other drugs, medicaments and biological substances status: Secondary | ICD-10-CM | POA: Diagnosis not present

## 2020-10-27 DIAGNOSIS — Z800 Family history of malignant neoplasm of digestive organs: Secondary | ICD-10-CM | POA: Diagnosis not present

## 2020-10-27 DIAGNOSIS — R443 Hallucinations, unspecified: Secondary | ICD-10-CM | POA: Diagnosis not present

## 2020-10-27 LAB — CBC
HCT: 25.8 % — ABNORMAL LOW (ref 36.0–46.0)
Hemoglobin: 8.7 g/dL — ABNORMAL LOW (ref 12.0–15.0)
MCH: 30.6 pg (ref 26.0–34.0)
MCHC: 33.7 g/dL (ref 30.0–36.0)
MCV: 90.8 fL (ref 80.0–100.0)
Platelets: 124 10*3/uL — ABNORMAL LOW (ref 150–400)
RBC: 2.84 MIL/uL — ABNORMAL LOW (ref 3.87–5.11)
RDW: 16 % — ABNORMAL HIGH (ref 11.5–15.5)
WBC: 14 10*3/uL — ABNORMAL HIGH (ref 4.0–10.5)
nRBC: 0 % (ref 0.0–0.2)

## 2020-10-27 NOTE — Progress Notes (Signed)
  Subjective:  POD #2 s/p right total knee arthroplasty.   Patient reports right knee pain as marked.  Patient states she had a horrible night in regards to pain.  Patient also states she was disoriented and her husband states the patient tried to call him at home at 3:00 in the morning.  Patient states she was disoriented and did not know where she was and could not figure out how to contact the nurses for pain medication.  Patient states she is beginning to pass flatus but has not had a bowel movement.  Her husband states it is taking the assistance of 2 people to have the patient's stand to get out of bed.  Objective:   VITALS:   Vitals:   10/26/20 2334 10/27/20 0350 10/27/20 0836 10/27/20 1132  BP: (!) 149/64 (!) 141/56 119/79 135/64  Pulse: 79 80 74 79  Resp: 20 18 18 16   Temp: 98.8 F (37.1 C) 98.8 F (37.1 C) 98.1 F (36.7 C) 98.6 F (37 C)  TempSrc:      SpO2: 97% 98% 96% 99%  Weight:      Height:        PHYSICAL EXAM: Right lower extremity: I personally change the patient's dressings today.  I left the Aquacel in place as she had a small amount of serosanguineous drainage but no signs of active bleeding.  Patient has ecchymosis and mild to moderate swelling of the right knee but no erythema.  Her leg compartments are soft and compressible.  She has palpable pedal pulses, intact sensation light touch and she can dorsiflex and plantarflex her ankle without weakness. Neurovascular intact Sensation intact distally Intact pulses distally Dorsiflexion/Plantar flexion intact No cellulitis present Compartment soft  LABS  Results for orders placed or performed during the hospital encounter of 10/25/20 (from the past 24 hour(s))  Glucose, capillary     Status: Abnormal   Collection Time: 10/26/20  5:09 PM  Result Value Ref Range   Glucose-Capillary 130 (H) 70 - 99 mg/dL  CBC     Status: Abnormal   Collection Time: 10/27/20  5:02 AM  Result Value Ref Range   WBC 14.0 (H) 4.0  - 10.5 K/uL   RBC 2.84 (L) 3.87 - 5.11 MIL/uL   Hemoglobin 8.7 (L) 12.0 - 15.0 g/dL   HCT 25.8 (L) 36.0 - 46.0 %   MCV 90.8 80.0 - 100.0 fL   MCH 30.6 26.0 - 34.0 pg   MCHC 33.7 30.0 - 36.0 g/dL   RDW 16.0 (H) 11.5 - 15.5 %   Platelets 124 (L) 150 - 400 K/uL   nRBC 0.0 0.0 - 0.2 %    No results found.  Assessment/Plan: 2 Days Post-Op   Active Problems:   S/P TKR (total knee replacement) using cement, right  Patient is still having significant right knee pain.  She will continue to ice and elevate her right lower extremity.  I placed the pillow under her right leg to elevate it.  Patient had a towel placed under her left heel to avoid developing a pressure ulcer.  Continue physical therapy that the patient can tolerate.  She is requiring significant assistance to get up to the commode.  She is not ready for discharge today and should be changed to inpatient status.   Thornton Park , MD 10/27/2020, 2:52 PM

## 2020-10-27 NOTE — Progress Notes (Deleted)
Physical Therapy Treatment Patient Details Name: Megan Bradley MRN: [id number] DOB: May 27, 1941 Today's Date: 10/27/2020    History of Present Illness Pt is a 80 y/o F who underwent scheduled R TKA on 10/25/20 by Dr. Mack Guise. PMH: anxiety, arthritis, cervical dysplasia, DOE, GERD, hypothyroidism, orthostatic hypotension, pre-diabetes, sleep apnea (does not use cpap), vertigo    PT Comments    Pt was able to stand and stabilize better and talked with her about residual weakness and strugle to walk.  Pt is asking to go directly home, but talked with her about safety related to going home so in need of help to maneuver.  Pt is going to discuss her need for rehab with husband and will reconsider her need to have inpt treatment.  Follow for goals of acute PT.  Follow Up Recommendations  SNF     Equipment Recommendations  Rolling walker with 5" wheels;3in1 (PT)    Recommendations for Other Services       Precautions / Restrictions Precautions Precautions: Fall;Knee Precaution Comments: immob when OOB to walk Required Braces or Orthoses: Knee Immobilizer - Right Knee Immobilizer - Right: On when out of bed or walking Restrictions Weight Bearing Restrictions: Yes RLE Weight Bearing: Weight bearing as tolerated    Mobility  Bed Mobility Overal bed mobility: Needs Assistance Bed Mobility: Rolling     Supine to sit: Min guard          Transfers Overall transfer level: Needs assistance Equipment used: Rolling walker (2 wheeled) Transfers: Sit to/from Stand Sit to Stand: Mod assist Stand pivot transfers: Mod assist       General transfer comment: reminders 100% of the time for hand placement  Ambulation/Gait Ambulation/Gait assistance: Min assist Gait Distance (Feet): 16 Feet (4x4) Assistive device: Rolling walker (2 wheeled) Gait Pattern/deviations: Step-to pattern;Decreased stride length;Wide base of support;Trunk flexed Gait velocity: decreased Gait velocity  interpretation: <1.31 ft/sec, indicative of household ambulator General Gait Details: sidesteps with immobilizer,   Stairs             Wheelchair Mobility    Modified Rankin (Stroke Patients Only)       Balance Overall balance assessment: Needs assistance Sitting-balance support: Feet supported;Bilateral upper extremity supported Sitting balance-Leahy Scale: Fair Sitting balance - Comments: min assist with pt demonstrating worsening balance the longer she sits EOB; pt with posterior/L LOB Postural control: Posterior lean Standing balance support: During functional activity;Bilateral upper extremity supported Standing balance-Leahy Scale: Poor Standing balance comment: multiple posterior LOB                            Cognition Arousal/Alertness: Awake/alert Behavior During Therapy: Anxious Overall Cognitive Status: Within Functional Limits for tasks assessed                                        Exercises Total Joint Exercises Ankle Circles/Pumps: AAROM;5 reps Quad Sets: AROM;10 reps Heel Slides: AAROM;10 reps Hip ABduction/ADduction: AAROM;10 reps Straight Leg Raises: AROM;Strengthening;Right;10 reps;Supine    General Comments General comments (skin integrity, edema, etc.): pt tired and unable to do any walking as she had returned to bed      Pertinent Vitals/Pain Pain Assessment: Faces Faces Pain Scale: Hurts even more Pain Location: R knee Pain Descriptors / Indicators: Grimacing;Guarding    Home Living  Prior Function            PT Goals (current goals can now be found in the care plan section) Acute Rehab PT Goals Patient Stated Goal: reduced R knee pain Progress towards PT goals: Progressing toward goals    Frequency    BID      PT Plan Current plan remains appropriate    Co-evaluation              AM-PAC PT "6 Clicks" Mobility   Outcome Measure  Help needed turning  from your back to your side while in a flat bed without using bedrails?: A Little Help needed moving from lying on your back to sitting on the side of a flat bed without using bedrails?: A Little Help needed moving to and from a bed to a chair (including a wheelchair)?: A Lot Help needed standing up from a chair using your arms (e.g., wheelchair or bedside chair)?: A Lot Help needed to walk in hospital room?: A Little Help needed climbing 3-5 steps with a railing? : Total 6 Click Score: 14    End of Session Equipment Utilized During Treatment: Right knee immobilizer;Oxygen Activity Tolerance: Patient limited by fatigue;Patient limited by lethargy;Patient limited by pain Patient left: with call bell/phone within reach;with SCD's reapplied;in chair;with chair alarm set Nurse Communication: Mobility status PT Visit Diagnosis: Unsteadiness on feet (R26.81);Muscle weakness (generalized) (M62.81);Difficulty in walking, not elsewhere classified (R26.2)     Time: 7353-2992 PT Time Calculation (min) (ACUTE ONLY): 32 min  Charges:  $Gait Training: 8-22 mins $Therapeutic Exercise: 23-37 mins $Therapeutic Activity: 8-22 mins                Ramond Dial 10/27/2020, 5:55 PM Mee Hives, PT MS Acute Rehab Dept. Number: [place] and [place]

## 2020-10-27 NOTE — Progress Notes (Signed)
Physical Therapy Treatment Patient Details Name: NAOKO DIPERNA MRN: [id number] DOB: Feb 18, 1941 Today's Date: 10/27/2020    History of Present Illness Pt is a 80 y/o F who underwent scheduled R TKA on 10/25/20 by Dr. Mack Guise. PMH: anxiety, arthritis, cervical dysplasia, DOE, GERD, hypothyroidism, orthostatic hypotension, pre-diabetes, sleep apnea (does not use cpap), vertigo    PT Comments    Pt was able to stand and stabilize better and talked with her about residual weakness and strugle to walk.  Pt is asking to go directly home, but talked with her about safety related to going home so in need of help to maneuver.  Pt is going to discuss her need for rehab with husband and will reconsider her need to have inpt treatment.  Follow for goals of acute PT.  Follow Up Recommendations  SNF     Equipment Recommendations  Rolling walker with 5" wheels;3in1 (PT)    Recommendations for Other Services       Precautions / Restrictions Precautions Precautions: Fall;Knee Precaution Comments: immob when OOB to walk Required Braces or Orthoses: Knee Immobilizer - Right Knee Immobilizer - Right: On when out of bed or walking Restrictions Weight Bearing Restrictions: Yes RLE Weight Bearing: Weight bearing as tolerated    Mobility  Bed Mobility Overal bed mobility: Needs Assistance Bed Mobility: Supine to Sit     Supine to sit: Min guard          Transfers Overall transfer level: Needs assistance Equipment used: Rolling walker (2 wheeled) Transfers: Sit to/from Stand Sit to Stand: Mod assist Stand pivot transfers: Mod assist       General transfer comment: reminders 100% of the time for hand placement  Ambulation/Gait Ambulation/Gait assistance: Min assist Gait Distance (Feet): 16 Feet (4x4) Assistive device: Rolling walker (2 wheeled) Gait Pattern/deviations: Step-to pattern;Decreased stride length;Wide base of support;Trunk flexed Gait velocity: decreased Gait velocity  interpretation: <1.31 ft/sec, indicative of household ambulator General Gait Details: sidesteps with immobilizer,   Stairs             Wheelchair Mobility    Modified Rankin (Stroke Patients Only)       Balance Overall balance assessment: Needs assistance Sitting-balance support: Feet supported;Bilateral upper extremity supported Sitting balance-Leahy Scale: Fair Sitting balance - Comments: min assist with pt demonstrating worsening balance the longer she sits EOB; pt with posterior/L LOB Postural control: Posterior lean Standing balance support: During functional activity;Bilateral upper extremity supported Standing balance-Leahy Scale: Poor Standing balance comment: multiple posterior LOB                            Cognition Arousal/Alertness: Awake/alert Behavior During Therapy: Anxious Overall Cognitive Status: Within Functional Limits for tasks assessed                                        Exercises      General Comments General comments (skin integrity, edema, etc.): better control of balance with gait today, including sidesteps to chair      Pertinent Vitals/Pain Faces Pain Scale: Hurts worst Pain Location: R knee Pain Descriptors / Indicators: Grimacing;Guarding    Home Living                      Prior Function            PT Goals (current goals  can now be found in the care plan section) Acute Rehab PT Goals Patient Stated Goal: reduced R knee pain Progress towards PT goals: Progressing toward goals    Frequency    BID      PT Plan Current plan remains appropriate    Co-evaluation              AM-PAC PT "6 Clicks" Mobility   Outcome Measure  Help needed turning from your back to your side while in a flat bed without using bedrails?: A Little Help needed moving from lying on your back to sitting on the side of a flat bed without using bedrails?: A Little Help needed moving to and from a  bed to a chair (including a wheelchair)?: A Lot Help needed standing up from a chair using your arms (e.g., wheelchair or bedside chair)?: A Lot Help needed to walk in hospital room?: A Little Help needed climbing 3-5 steps with a railing? : Total 6 Click Score: 14    End of Session Equipment Utilized During Treatment: Right knee immobilizer;Oxygen Activity Tolerance: Patient limited by fatigue;Patient limited by lethargy;Patient limited by pain Patient left: with call bell/phone within reach;with SCD's reapplied;in chair;with chair alarm set Nurse Communication: Mobility status PT Visit Diagnosis: Unsteadiness on feet (R26.81);Muscle weakness (generalized) (M62.81);Difficulty in walking, not elsewhere classified (R26.2)     Time: 9381-0175 PT Time Calculation (min) (ACUTE ONLY): 32 min  Charges:  $Gait Training: 8-22 mins $Therapeutic Activity: 8-22 mins                Ramond Dial 10/27/2020, 5:45 PM Mee Hives, PT MS Acute Rehab Dept. Number: [place]

## 2020-10-27 NOTE — Progress Notes (Signed)
Physical Therapy Treatment Patient Details Name: Megan Bradley MRN: [id number] DOB: September 04, 1940 Today's Date: 10/27/2020    History of Present Illness Pt is a 80 y/o F who underwent scheduled R TKA on 10/25/20 by Dr. Mack Guise. PMH: anxiety, arthritis, cervical dysplasia, DOE, GERD, hypothyroidism, orthostatic hypotension, pre-diabetes, sleep apnea (does not use cpap), vertigo    PT Comments    Pt was up to doing only exercises this afternoon, and was unable to stand due to fatigue.  Pt is planning to go home stilll per her conversation, and will expect her to consider the rehab stay tomorrow.  Follow for acute PT goals.   Follow Up Recommendations  SNF     Equipment Recommendations  Rolling walker with 5" wheels;3in1 (PT)    Recommendations for Other Services       Precautions / Restrictions Precautions Precautions: Fall;Knee Precaution Comments: immob when OOB to walk Required Braces or Orthoses: Knee Immobilizer - Right Knee Immobilizer - Right: On when out of bed or walking Restrictions Weight Bearing Restrictions: Yes RLE Weight Bearing: Weight bearing as tolerated    Mobility  Bed Mobility Overal bed mobility: Needs Assistance Bed Mobility: Rolling     Supine to sit: Min guard          Transfers Overall transfer level: Needs assistance Equipment used: Rolling walker (2 wheeled) Transfers: Sit to/from Stand Sit to Stand: Mod assist Stand pivot transfers: Mod assist       General transfer comment: reminders 100% of the time for hand placement  Ambulation/Gait Ambulation/Gait assistance: Min assist Gait Distance (Feet): 16 Feet (4x4) Assistive device: Rolling walker (2 wheeled) Gait Pattern/deviations: Step-to pattern;Decreased stride length;Wide base of support;Trunk flexed Gait velocity: decreased Gait velocity interpretation: <1.31 ft/sec, indicative of household ambulator General Gait Details: sidesteps with immobilizer,   Stairs              Wheelchair Mobility    Modified Rankin (Stroke Patients Only)       Balance Overall balance assessment: Needs assistance Sitting-balance support: Feet supported;Bilateral upper extremity supported Sitting balance-Leahy Scale: Fair Sitting balance - Comments: min assist with pt demonstrating worsening balance the longer she sits EOB; pt with posterior/L LOB Postural control: Posterior lean Standing balance support: During functional activity;Bilateral upper extremity supported Standing balance-Leahy Scale: Poor Standing balance comment: multiple posterior LOB                            Cognition Arousal/Alertness: Awake/alert Behavior During Therapy: Anxious Overall Cognitive Status: Within Functional Limits for tasks assessed                                        Exercises Total Joint Exercises Ankle Circles/Pumps: AAROM;5 reps Quad Sets: AROM;10 reps Heel Slides: AAROM;10 reps Hip ABduction/ADduction: AAROM;10 reps Straight Leg Raises: AROM;Strengthening;Right;10 reps;Supine    General Comments General comments (skin integrity, edema, etc.): pt tired and unable to do any walking as she had returned to bed      Pertinent Vitals/Pain Pain Assessment: Faces Faces Pain Scale: Hurts even more Pain Location: R knee Pain Descriptors / Indicators: Grimacing;Guarding    Home Living                      Prior Function            PT Goals (current goals  can now be found in the care plan section) Acute Rehab PT Goals Patient Stated Goal: reduced R knee pain Progress towards PT goals: Progressing toward goals    Frequency    BID      PT Plan Current plan remains appropriate    Co-evaluation              AM-PAC PT "6 Clicks" Mobility   Outcome Measure  Help needed turning from your back to your side while in a flat bed without using bedrails?: A Little Help needed moving from lying on your back to sitting on the  side of a flat bed without using bedrails?: A Little Help needed moving to and from a bed to a chair (including a wheelchair)?: A Lot Help needed standing up from a chair using your arms (e.g., wheelchair or bedside chair)?: A Lot Help needed to walk in hospital room?: A Little Help needed climbing 3-5 steps with a railing? : Total 6 Click Score: 14    End of Session Equipment Utilized During Treatment: Right knee immobilizer;Oxygen Activity Tolerance: Patient limited by fatigue;Patient limited by lethargy;Patient limited by pain Patient left: with call bell/phone within reach;with SCD's reapplied;in chair;with chair alarm set Nurse Communication: Mobility status PT Visit Diagnosis: Unsteadiness on feet (R26.81);Muscle weakness (generalized) (M62.81);Difficulty in walking, not elsewhere classified (R26.2)     Time: 0762-2633 PT Time Calculation (min) (ACUTE ONLY): 32 min  Charges:  $Gait Training: 8-22 mins $Therapeutic Exercise: 23-37 mins $Therapeutic Activity: 8-22 mins                  Ramond Dial 10/27/2020, 5:51 PM  Mee Hives, PT MS Acute Rehab Dept. Number: [place] and [place]

## 2020-10-27 NOTE — Progress Notes (Signed)
Pt was able to stand and stabilize better and talked with her about residual weakness and strugle to walk.  Pt is asking to go directly home, but talked with her about safety related to going home so in need of help to maneuver.  Pt is going to discuss her need for rehab with husband and will reconsider her need to have inpt treatment.  Follow for goals of acute PT.    10/27/20 1700  PT Visit Information  Last PT Received On 10/27/20  Assistance Needed +1  History of Present Illness Pt is a 80 y/o F who underwent scheduled R TKA on 10/25/20 by Dr. Mack Guise. PMH: anxiety, arthritis, cervical dysplasia, DOE, GERD, hypothyroidism, orthostatic hypotension, pre-diabetes, sleep apnea (does not use cpap), vertigo  Subjective Data  Subjective reports she does not remember therapy yesterday  Patient Stated Goal reduced R knee pain  Precautions  Precautions Fall;Knee  Precaution Comments immob when OOB to walk  Required Braces or Orthoses Knee Immobilizer - Right  Knee Immobilizer - Right On when out of bed or walking  Restrictions  Weight Bearing Restrictions Yes  RLE Weight Bearing WBAT  Pain Assessment  Faces Pain Scale 10  Pain Location R knee  Pain Descriptors / Indicators Grimacing;Guarding  Cognition  Arousal/Alertness Awake/alert  Behavior During Therapy Anxious  Overall Cognitive Status Within Functional Limits for tasks assessed  Bed Mobility  Overal bed mobility Needs Assistance  Bed Mobility Supine to Sit  Supine to sit Min guard  Transfers  Overall transfer level Needs assistance  Equipment used Rolling walker (2 wheeled)  Transfers Sit to/from Stand  Sit to Stand Mod assist  Stand pivot transfers Mod assist  General transfer comment reminders 100% of the time for hand placement  Ambulation/Gait  Ambulation/Gait assistance Min assist  Gait Distance (Feet) 16 Feet (4x4)  Assistive device Rolling walker (2 wheeled)  Gait Pattern/deviations Step-to pattern;Decreased stride  length;Wide base of support;Trunk flexed  General Gait Details sidesteps with immobilizer,  Gait velocity decreased  Gait velocity interpretation <1.31 ft/sec, indicative of household ambulator  Balance  Overall balance assessment Needs assistance  Sitting-balance support Feet supported;Bilateral upper extremity supported  Sitting balance-Leahy Scale Fair  Sitting balance - Comments min assist with pt demonstrating worsening balance the longer she sits EOB; pt with posterior/L LOB  Postural control Posterior lean  Standing balance support During functional activity;Bilateral upper extremity supported  Standing balance-Leahy Scale Poor  Standing balance comment multiple posterior LOB  General Comments  General comments (skin integrity, edema, etc.) better control of balance with gait today, including sidesteps to chair  PT - End of Session  Equipment Utilized During Treatment Right knee immobilizer;Oxygen  Activity Tolerance Patient limited by fatigue;Patient limited by lethargy;Patient limited by pain  Patient left with call bell/phone within reach;with SCD's reapplied;in chair;with chair alarm set  Nurse Communication Mobility status   PT - Assessment/Plan  PT Plan Current plan remains appropriate  PT Visit Diagnosis Unsteadiness on feet (R26.81);Muscle weakness (generalized) (M62.81);Difficulty in walking, not elsewhere classified (R26.2)  PT Frequency (ACUTE ONLY) BID  Follow Up Recommendations SNF  PT equipment Rolling walker with 5" wheels;3in1 (PT)  AM-PAC PT "6 Clicks" Mobility Outcome Measure (Version 2)  Help needed turning from your back to your side while in a flat bed without using bedrails? 3  Help needed moving from lying on your back to sitting on the side of a flat bed without using bedrails? 3  Help needed moving to and from a bed to  a chair (including a wheelchair)? 2  Help needed standing up from a chair using your arms (e.g., wheelchair or bedside chair)? 2  Help  needed to walk in hospital room? 3  Help needed climbing 3-5 steps with a railing?  1  6 Click Score 14  Consider Recommendation of Discharge To: CIR/SNF/LTACH  PT Goal Progression  Progress towards PT goals Progressing toward goals  PT Time Calculation  PT Start Time (ACUTE ONLY) 0840  PT Stop Time (ACUTE ONLY) 0908  PT Time Calculation (min) (ACUTE ONLY) 28 min  PT General Charges  $$ ACUTE PT VISIT 1 Visit  PT Treatments  $Gait Training 8-22 mins  $Therapeutic Activity 8-22 mins    Mee Hives, PT MS Acute Rehab Dept. Number: [place] [facility] [place]

## 2020-10-27 NOTE — Plan of Care (Signed)
  Problem: Education: Goal: Knowledge of General Education information will improve Description: Including pain rating scale, medication(s)/side effects and non-pharmacologic comfort measures Outcome: Progressing   Problem: Health Behavior/Discharge Planning: Goal: Ability to manage health-related needs will improve Outcome: Progressing   Problem: Clinical Measurements: Goal: Will remain free from infection Outcome: Progressing   Problem: Activity: Goal: Ability to avoid complications of mobility impairment will improve Outcome: Progressing   Problem: Pain Management: Goal: Pain level will decrease with appropriate interventions Outcome: Progressing

## 2020-10-28 NOTE — Plan of Care (Signed)
  Problem: Education: Goal: Knowledge of General Education information will improve Description: Including pain rating scale, medication(s)/side effects and non-pharmacologic comfort measures Outcome: Progressing   Problem: Health Behavior/Discharge Planning: Goal: Ability to manage health-related needs will improve Outcome: Progressing   Problem: Clinical Measurements: Goal: Will remain free from infection Outcome: Progressing   Problem: Activity: Goal: Ability to avoid complications of mobility impairment will improve Outcome: Progressing   Problem: Pain Management: Goal: Pain level will decrease with appropriate interventions Outcome: Progressing   

## 2020-10-28 NOTE — Progress Notes (Signed)
  Subjective:  POD #3 s/p right total knee arthroplasty.   Patient reports right knee pain as mild to moderate.  Patient states she still had hallucinations overnight and felt dizziness when she got up today with physical therapy.  Objective:   VITALS:   Vitals:   10/27/20 2019 10/28/20 0449 10/28/20 0925 10/28/20 1645  BP: 135/68 (!) 117/54 (!) 138/56 (!) 102/47  Pulse: 84 73 73 69  Resp: 17 17 (!) 22 18  Temp: 100.1 F (37.8 C) 97.8 F (36.6 C) 98.1 F (36.7 C) 98.4 F (36.9 C)  TempSrc:    Oral  SpO2: 96% 97% 92% 93%  Weight:      Height:        PHYSICAL EXAM: Right lower extremity Neurovascular intact Sensation intact distally Intact pulses distally Dorsiflexion/Plantar flexion intact Incision: no drainage No cellulitis present Compartment soft  LABS  No results found for this or any previous visit (from the past 24 hour(s)).  No results found.  Assessment/Plan: 3 Days Post-Op   Active Problems:   S/P TKR (total knee replacement) using cement, right   S/P TKR (total knee replacement)  Patient is improving postop.  I expect the patient's hallucinations and dizziness will improve as she weans off of pain medication.  Continue Lovenox for DVT prophylaxis.  Continue with physical therapy.  Patient is being recommended for skilled nursing facility and the patient and her family will touch base with the case manager tomorrow.    Thornton Park , MD 10/28/2020, 7:36 PM

## 2020-10-28 NOTE — Progress Notes (Signed)
Physical Therapy Treatment Patient Details Name: Megan Bradley MRN: [id number] DOB: 1941-03-15 Today's Date: 10/28/2020    History of Present Illness Pt is a 80 y/o F who underwent scheduled R TKA on 10/25/20 by Dr. Mack Guise. PMH: anxiety, arthritis, cervical dysplasia, DOE, GERD, hypothyroidism, orthostatic hypotension, pre-diabetes, sleep apnea (does not use cpap), vertigo    PT Comments    Participated in exercises as described below.  Ambulated 16' x 3 with RW and min a x 1.  Cues for hand placement and sequencing.  She did have one LOB with gait required min a x 1 to recover and could have resulted in fall if not intervention given.  KI donned.    SNF remains appropriate for discharge.'   Follow Up Recommendations  SNF     Equipment Recommendations  Rolling walker with 5" wheels;3in1 (PT)    Recommendations for Other Services       Precautions / Restrictions Precautions Precautions: Fall;Knee Precaution Comments: immob when OOB to walk Required Braces or Orthoses: Knee Immobilizer - Right Knee Immobilizer - Right: On when out of bed or walking Restrictions Weight Bearing Restrictions: Yes RLE Weight Bearing: Weight bearing as tolerated    Mobility  Bed Mobility Overal bed mobility: Needs Assistance       Supine to sit: Min guard          Transfers Overall transfer level: Needs assistance Equipment used: Rolling walker (2 wheeled) Transfers: Sit to/from Stand Sit to Stand: Min assist            Ambulation/Gait Ambulation/Gait assistance: Min Web designer (Feet): 16 Feet Assistive device: Rolling walker (2 wheeled) Gait Pattern/deviations: Step-to pattern;Decreased stride length;Wide base of support;Trunk flexed Gait velocity: decreased   General Gait Details: about bed, to bathoom then back to chair.  3 bouts of 16'   Stairs             Wheelchair Mobility    Modified Rankin (Stroke Patients Only)       Balance Overall  balance assessment: Needs assistance Sitting-balance support: Feet supported;Bilateral upper extremity supported Sitting balance-Leahy Scale: Fair Sitting balance - Comments: min assist with pt demonstrating worsening balance the longer she sits EOB; pt with posterior/L LOB   Standing balance support: During functional activity;Bilateral upper extremity supported Standing balance-Leahy Scale: Fair Standing balance comment: improved but on eLOB requried min assist to recover.                            Cognition Arousal/Alertness: Awake/alert Behavior During Therapy: Anxious Overall Cognitive Status: Within Functional Limits for tasks assessed                                        Exercises Total Joint Exercises Ankle Circles/Pumps: AROM;Right;10 reps;Supine Quad Sets: AROM;Strengthening;Right;10 reps;Supine Short Arc Quad: AROM;Strengthening;Right;10 reps;Supine Heel Slides: AROM;Strengthening;10 reps;Right;Supine Hip ABduction/ADduction: AROM;Strengthening;Right;10 reps;Supine (hip abduction slides) Straight Leg Raises: AROM;Strengthening;Right;10 reps;Supine Long Arc Quad: AROM;Strengthening;Right;10 reps;Seated Knee Flexion: AROM;Strengthening;Right;10 reps;Seated Goniometric ROM: 4-90    General Comments        Pertinent Vitals/Pain Pain Assessment: Faces Faces Pain Scale: Hurts little more Pain Location: R knee Pain Descriptors / Indicators: Grimacing;Guarding Pain Intervention(s): Limited activity within patient's tolerance;Monitored during session;Repositioned    Home Living  Prior Function            PT Goals (current goals can now be found in the care plan section) Progress towards PT goals: Progressing toward goals    Frequency    BID      PT Plan Current plan remains appropriate    Co-evaluation              AM-PAC PT "6 Clicks" Mobility   Outcome Measure  Help needed turning from  your back to your side while in a flat bed without using bedrails?: A Little Help needed moving from lying on your back to sitting on the side of a flat bed without using bedrails?: A Little Help needed moving to and from a bed to a chair (including a wheelchair)?: A Lot Help needed standing up from a chair using your arms (e.g., wheelchair or bedside chair)?: A Lot Help needed to walk in hospital room?: A Little Help needed climbing 3-5 steps with a railing? : Total 6 Click Score: 14    End of Session Equipment Utilized During Treatment: Right knee immobilizer;Oxygen Activity Tolerance: Patient limited by fatigue;Patient limited by lethargy;Patient limited by pain Patient left: with call bell/phone within reach;with SCD's reapplied;in chair;with chair alarm set Nurse Communication: Mobility status PT Visit Diagnosis: Unsteadiness on feet (R26.81);Muscle weakness (generalized) (M62.81);Difficulty in walking, not elsewhere classified (R26.2)     Time: 9604-5409 PT Time Calculation (min) (ACUTE ONLY): 34 min  Charges:  $Gait Training: 8-22 mins $Therapeutic Exercise: 8-22 mins                    Chesley Noon, PTA 10/28/20, 1:30 PM

## 2020-10-29 MED ORDER — HYDROCODONE-ACETAMINOPHEN 5-325 MG PO TABS: 1.0000 | ORAL_TABLET | ORAL | Status: DC | PRN

## 2020-10-29 MED ORDER — TRAMADOL HCL 50 MG PO TABS
50.0000 mg | ORAL_TABLET | Freq: Four times a day (QID) | ORAL | Status: DC | PRN
  Administered 2020-10-29 – 2020-10-30 (×3): 50 mg via ORAL
  Filled 2020-10-29 (×3): qty 1

## 2020-10-29 NOTE — Progress Notes (Signed)
Physical Therapy Treatment Patient Details Name: Megan Bradley MRN: [id number] DOB: Mar 07, 1941 Today's Date: 10/29/2020    History of Present Illness Pt is a 80 y/o F who underwent scheduled R TKA on 10/25/20 by Dr. Mack Guise. PMH: anxiety, arthritis, cervical dysplasia, DOE, GERD, hypothyroidism, orthostatic hypotension, pre-diabetes, sleep apnea (does not use cpap), vertigo    PT Comments    Participated in exercises as described below.  OOB and able to walk 20' x 2 with very slow cautious gait.  KI donned.  No LOB today and denies dizziness during session.    Follow Up Recommendations  SNF     Equipment Recommendations  Rolling walker with 5" wheels;3in1 (PT)    Recommendations for Other Services       Precautions / Restrictions Precautions Precautions: Fall;Knee Precaution Comments: immob when OOB to walk Required Braces or Orthoses: Knee Immobilizer - Right Knee Immobilizer - Right: On when out of bed or walking Restrictions Weight Bearing Restrictions: Yes RLE Weight Bearing: Weight bearing as tolerated    Mobility  Bed Mobility Overal bed mobility: Needs Assistance Bed Mobility: Supine to Sit Rolling: Min guard              Transfers Overall transfer level: Needs assistance Equipment used: Rolling walker (2 wheeled) Transfers: Sit to/from Stand Sit to Stand: Min assist            Ambulation/Gait Ambulation/Gait assistance: Min Web designer (Feet): 20 Feet Assistive device: Rolling walker (2 wheeled) Gait Pattern/deviations: Step-to pattern;Decreased stride length;Wide base of support;Trunk flexed Gait velocity: decreased       Stairs             Wheelchair Mobility    Modified Rankin (Stroke Patients Only)       Balance Overall balance assessment: Needs assistance Sitting-balance support: Feet supported;Bilateral upper extremity supported Sitting balance-Leahy Scale: Fair Sitting balance - Comments: min assist with pt  demonstrating worsening balance the longer she sits EOB; pt with posterior/L LOB   Standing balance support: During functional activity;Bilateral upper extremity supported Standing balance-Leahy Scale: Fair Standing balance comment: improved but on eLOB requried min assist to recover.                            Cognition Arousal/Alertness: Awake/alert Behavior During Therapy: WFL for tasks assessed/performed Overall Cognitive Status: Within Functional Limits for tasks assessed                                        Exercises Total Joint Exercises Ankle Circles/Pumps: AROM;Right;10 reps;Supine Quad Sets: AROM;Strengthening;Right;10 reps;Supine Short Arc Quad: AROM;Strengthening;Right;10 reps;Supine Heel Slides: AROM;Strengthening;10 reps;Right;Supine Hip ABduction/ADduction: AROM;Strengthening;Right;10 reps;Supine (hip abduction slides) Straight Leg Raises: AROM;Strengthening;Right;10 reps;Supine Long Arc Quad: AROM;Strengthening;Right;10 reps;Seated Knee Flexion: AROM;Strengthening;Right;10 reps;Seated Goniometric ROM: 2-92    General Comments        Pertinent Vitals/Pain Pain Assessment: Faces Faces Pain Scale: Hurts little more Pain Location: R knee Pain Descriptors / Indicators: Grimacing;Guarding Pain Intervention(s): Limited activity within patient's tolerance;Monitored during session;Repositioned;Ice applied    Home Living                      Prior Function            PT Goals (current goals can now be found in the care plan section) Progress towards PT goals: Progressing toward goals  Frequency    BID      PT Plan Current plan remains appropriate    Co-evaluation              AM-PAC PT "6 Clicks" Mobility   Outcome Measure  Help needed turning from your back to your side while in a flat bed without using bedrails?: A Little Help needed moving from lying on your back to sitting on the side of a flat bed  without using bedrails?: A Little Help needed moving to and from a bed to a chair (including a wheelchair)?: A Little Help needed standing up from a chair using your arms (e.g., wheelchair or bedside chair)?: A Little Help needed to walk in hospital room?: A Little Help needed climbing 3-5 steps with a railing? : A Lot 6 Click Score: 17    End of Session Equipment Utilized During Treatment: Right knee immobilizer Activity Tolerance: Patient tolerated treatment well Patient left: with call bell/phone within reach;with SCD's reapplied;in chair;with chair alarm set Nurse Communication: Mobility status PT Visit Diagnosis: Unsteadiness on feet (R26.81);Muscle weakness (generalized) (M62.81);Difficulty in walking, not elsewhere classified (R26.2)     Time: 5038-8828 PT Time Calculation (min) (ACUTE ONLY): 33 min  Charges:  $Gait Training: 8-22 mins $Therapeutic Exercise: 8-22 mins                    Chesley Noon, PTA 10/29/20, 12:49 PM

## 2020-10-29 NOTE — Progress Notes (Signed)
Occupational Therapy Treatment Patient Details Name: Megan Bradley MRN: [id number] DOB: 11-25-1940 Today's Date: 10/29/2020    History of present illness Pt is a 80 y/o F who underwent scheduled R TKA on 10/25/20 by Dr. Mack Guise. PMH: anxiety, arthritis, cervical dysplasia, DOE, GERD, hypothyroidism, orthostatic hypotension, pre-diabetes, sleep apnea (does not use cpap), vertigo   OT comments  Upon entering the room, pt supine in bed with husband present in the room. Pt sleeping soundly but awakens easily to therapist's voice. Pt agreeable to OT intervention. Focus on B UE strengthening exercises with use of yellow, level 1 resistive theraband. Pt performing 3 sets of 10 chest pulls, shoulder diagonals, shoulder flexion, bicep curls, and alternating punches. Band left for pt to do exercises in her room. Pt continues to benefit from OT intervention with continued recommendation for SNF at discharge.    Follow Up Recommendations  SNF;Supervision/Assistance - 24 hour    Equipment Recommendations  3 in 1 bedside commode       Precautions / Restrictions Precautions Precautions: Fall;Knee Precaution Comments: immob when OOB to walk Required Braces or Orthoses: Knee Immobilizer - Right Knee Immobilizer - Right: On when out of bed or walking Restrictions Weight Bearing Restrictions: Yes RLE Weight Bearing: Weight bearing as tolerated       Mobility Bed Mobility Overal bed mobility: Needs Assistance Bed Mobility: Supine to Sit Rolling: Min guard              Transfers Overall transfer level: Needs assistance Equipment used: Rolling walker (2 wheeled) Transfers: Sit to/from Stand Sit to Stand: Min assist              Balance Overall balance assessment: Needs assistance Sitting-balance support: Feet supported;Bilateral upper extremity supported Sitting balance-Leahy Scale: Fair Sitting balance - Comments: min assist with pt demonstrating worsening balance the longer she  sits EOB; pt with posterior/L LOB   Standing balance support: During functional activity;Bilateral upper extremity supported Standing balance-Leahy Scale: Fair Standing balance comment: improved but on eLOB requried min assist to recover.                           ADL either performed or assessed with clinical judgement        Vision Patient Visual Report: No change from baseline     Perception     Praxis      Cognition Arousal/Alertness: Awake/alert Behavior During Therapy: WFL for tasks assessed/performed Overall Cognitive Status: Within Functional Limits for tasks assessed                                          Exercises Total Joint Exercises Ankle Circles/Pumps: AROM;Right;10 reps;Supine Quad Sets: AROM;Strengthening;Right;10 reps;Supine Short Arc Quad: AROM;Strengthening;Right;10 reps;Supine Heel Slides: AROM;Strengthening;10 reps;Right;Supine Hip ABduction/ADduction: AROM;Strengthening;Right;10 reps;Supine (hip abduction slides) Straight Leg Raises: AROM;Strengthening;Right;10 reps;Supine Long Arc Quad: AROM;Strengthening;Right;10 reps;Seated Knee Flexion: AROM;Strengthening;Right;10 reps;Seated Goniometric ROM: 2-92           Pertinent Vitals/ Pain       Pain Assessment: 0-10 Pain Score: 6  Faces Pain Scale: Hurts little more Pain Location: R knee Pain Descriptors / Indicators: Grimacing;Guarding Pain Intervention(s): Limited activity within patient's tolerance;Monitored during session;Repositioned         Frequency  Min 2X/week        Progress Toward Goals  OT Goals(current goals can now be found  in the care plan section)  Progress towards OT goals: Progressing toward goals  Acute Rehab OT Goals Patient Stated Goal: reduced R knee pain OT Goal Formulation: With patient/family Time For Goal Achievement: 11/09/20 Potential to Achieve Goals: Good  Plan Discharge plan remains appropriate;Frequency remains  appropriate       AM-PAC OT "6 Clicks" Daily Activity     Outcome Measure   Help from another person eating meals?: None Help from another person taking care of personal grooming?: A Little Help from another person toileting, which includes using toliet, bedpan, or urinal?: A Lot Help from another person bathing (including washing, rinsing, drying)?: A Lot Help from another person to put on and taking off regular upper body clothing?: A Little Help from another person to put on and taking off regular lower body clothing?: A Lot 6 Click Score: 16    End of Session    OT Visit Diagnosis: Unsteadiness on feet (R26.81);Muscle weakness (generalized) (M62.81)   Activity Tolerance Patient tolerated treatment well   Patient Left in chair;with call bell/phone within reach;with bed alarm set;with family/visitor present   Nurse Communication Mobility status        Time: 0814-4818 OT Time Calculation (min): 24 min  Charges: OT General Charges $OT Visit: 1 Visit OT Treatments $Therapeutic Exercise: 23-37 mins  Darleen Crocker, MS, OTR/L , CBIS ascom [phone]  10/29/20, 4:04 PM

## 2020-10-29 NOTE — Progress Notes (Addendum)
Physical Therapy Treatment Patient Details Name: Megan Bradley MRN: [id number] DOB: 28-Aug-1940 Today's Date: 10/29/2020    History of Present Illness Pt is a 80 y/o F who underwent scheduled R TKA on 10/25/20 by Dr. Mack Guise. PMH: anxiety, arthritis, cervical dysplasia, DOE, GERD, hypothyroidism, orthostatic hypotension, pre-diabetes, sleep apnea (does not use cpap), vertigo    PT Comments    Back to bed but agrees to therapy session.  To EOB with increased time and min guard.  She is able to exit L, walk to just shy of door then to bathroom  40'.  Voids then stands and walks back to bed.  Gait quality decreased with fatigue and she does struggle some to get back to bed due to fatigue and knee pain.  Overall gait distances remain limited and SNF remains appropriate. She still struggles with hand placements and sequencing of walker/gait pattern requiring mod verbal and occasional tactile cues.  Husband in and questions answered regarding transition to SNF and expectations.   Follow Up Recommendations  SNF     Equipment Recommendations  Rolling walker with 5" wheels;3in1 (PT)    Recommendations for Other Services       Precautions / Restrictions Precautions Precautions: Fall;Knee Precaution Comments: immob when OOB to walk Required Braces or Orthoses: Knee Immobilizer - Right Knee Immobilizer - Right: On when out of bed or walking Restrictions Weight Bearing Restrictions: Yes RLE Weight Bearing: Weight bearing as tolerated    Mobility  Bed Mobility Overal bed mobility: Needs Assistance Bed Mobility: Supine to Sit;Sit to Supine Rolling: Min guard   Supine to sit: Min guard Sit to supine: Min assist        Transfers Overall transfer level: Needs assistance Equipment used: Rolling walker (2 wheeled) Transfers: Sit to/from Stand Sit to Stand: Min assist;Mod assist         General transfer comment: increased assist as she fatigued from  gait  Ambulation/Gait Ambulation/Gait assistance: Min assist Gait Distance (Feet): 40 Feet Assistive device: Rolling walker (2 wheeled) Gait Pattern/deviations: Step-to pattern;Decreased stride length;Wide base of support;Trunk flexed Gait velocity: decreased   General Gait Details: bed to door to bathroom to bed   Stairs             Wheelchair Mobility    Modified Rankin (Stroke Patients Only)       Balance Overall balance assessment: Needs assistance Sitting-balance support: Feet supported;Bilateral upper extremity supported Sitting balance-Leahy Scale: Fair Sitting balance - Comments: min assist with pt demonstrating worsening balance the longer she sits EOB; pt with posterior/L LOB   Standing balance support: During functional activity;Bilateral upper extremity supported Standing balance-Leahy Scale: Fair Standing balance comment: improved but on eLOB requried min assist to recover.                            Cognition Arousal/Alertness: Awake/alert Behavior During Therapy: WFL for tasks assessed/performed Overall Cognitive Status: Within Functional Limits for tasks assessed                                        Exercises Total Joint Exercises Ankle Circles/Pumps: AROM;Right;10 reps;Supine Quad Sets: AROM;Strengthening;Right;10 reps;Supine Short Arc Quad: AROM;Strengthening;Right;10 reps;Supine Heel Slides: AROM;Strengthening;10 reps;Right;Supine Hip ABduction/ADduction: AROM;Strengthening;Right;10 reps;Supine (hip abduction slides) Straight Leg Raises: AROM;Strengthening;Right;10 reps;Supine Long Arc Quad: AROM;Strengthening;Right;10 reps;Seated Knee Flexion: AROM;Strengthening;Right;10 reps;Seated Goniometric ROM: 2-92  General Comments        Pertinent Vitals/Pain Pain Assessment: Faces Pain Score: 6  Faces Pain Scale: Hurts little more Pain Location: R knee Pain Descriptors / Indicators: Grimacing;Guarding Pain  Intervention(s): Limited activity within patient's tolerance;Monitored during session;Repositioned;Ice applied    Home Living                      Prior Function            PT Goals (current goals can now be found in the care plan section) Acute Rehab PT Goals Patient Stated Goal: reduced R knee pain Progress towards PT goals: Progressing toward goals    Frequency    BID      PT Plan Current plan remains appropriate    Co-evaluation              AM-PAC PT "6 Clicks" Mobility   Outcome Measure  Help needed turning from your back to your side while in a flat bed without using bedrails?: A Little Help needed moving from lying on your back to sitting on the side of a flat bed without using bedrails?: A Little Help needed moving to and from a bed to a chair (including a wheelchair)?: A Little Help needed standing up from a chair using your arms (e.g., wheelchair or bedside chair)?: A Little Help needed to walk in hospital room?: A Little Help needed climbing 3-5 steps with a railing? : A Lot 6 Click Score: 17    End of Session Equipment Utilized During Treatment: Right knee immobilizer Activity Tolerance: Patient tolerated treatment well Patient left: with call bell/phone within reach;with SCD's reapplied;in chair;with chair alarm set Nurse Communication: Mobility status PT Visit Diagnosis: Unsteadiness on feet (R26.81);Muscle weakness (generalized) (M62.81);Difficulty in walking, not elsewhere classified (R26.2)     Time: 8264-1583 PT Time Calculation (min) (ACUTE ONLY): 23 min  Charges:  $Gait Training: 8-22 mins $Therapeutic Exercise: 8-22 mins $Therapeutic Activity: 8-22 mins                    Chesley Noon, PTA 10/29/20, 3:37 PM

## 2020-10-29 NOTE — TOC Progression Note (Signed)
Transition of Care [facility]) - Progression Note    Patient Details  Name: Megan Bradley MRN: [id number] Date of Birth: June 22, 1940  Transition of Care [facility]) CM/SW [place], RN Phone Number: 10/29/2020, 4:02 PM  Clinical Narrative:     Megan Bradley does not have a bed this week, [facility] does not have a bed this week,        Expected Discharge Plan and Services                                                 Social Determinants of Health (SDOH) Interventions    Readmission Risk Interventions No flowsheet data found.

## 2020-10-29 NOTE — TOC Progression Note (Signed)
Transition of Care [facility]) - Progression Note    Patient Details  Name: Megan Bradley MRN: [id number] Date of Birth: 19-Dec-1940  Transition of Care [facility]) CM/SW [place], RN Phone Number: 10/29/2020, 4:33 PM  Clinical Narrative:    Spoke with the patient and discussed Bed offers, she accepted the bed offer from [facility], Started ins approval process        Expected Discharge Plan and Services                                                 Social Determinants of Health (SDOH) Interventions    Readmission Risk Interventions No flowsheet data found.

## 2020-10-29 NOTE — Progress Notes (Signed)
  Subjective:  POD #4 s/p right total knee arthroplasty.   Patient reports right knee pain as mild to moderate.  Patient's husband is in the room.  Patient states she continues to have dizziness when she gets up.  She has increased right knee pain with PT.  Objective:   VITALS:   Vitals:   10/29/20 0435 10/29/20 0759 10/29/20 1251 10/29/20 1556  BP: (!) 118/51 97/82 113/87 (!) 108/56  Pulse: 71 82 68 68  Resp: 17 16 16  (!) 22  Temp: 98.4 F (36.9 C) 98.5 F (36.9 C) 98.8 F (37.1 C) 98.6 F (37 C)  TempSrc: Oral Oral Oral Oral  SpO2: 91% 96%  92%  Weight:      Height:        PHYSICAL EXAM: Right lower extremity Neurovascular intact Sensation intact distally Intact pulses distally Dorsiflexion/Plantar flexion intact Outer dressing remains clean dry and intact. No cellulitis present Compartment soft  LABS  No results found for this or any previous visit (from the past 24 hour(s)).  No results found.  Assessment/Plan: 4 Days Post-Op   Active Problems:   S/P TKR (total knee replacement) using cement, right   S/P TKR (total knee replacement)  Continue physical therapy.  Patient has been recommended for skilled nursing facility upon discharge.  Case management is awaiting bed offers.  Patient will be given narcotics judiciously and should preferentially receive Tylenol and tramadol for pain to help reduce dizziness.  Continue meclizine.    Thornton Park , MD 10/29/2020, 4:10 PM

## 2020-10-29 NOTE — Plan of Care (Signed)
  Problem: Education: Goal: Knowledge of General Education information will improve Description: Including pain rating scale, medication(s)/side effects and non-pharmacologic comfort measures Outcome: Progressing   Problem: Health Behavior/Discharge Planning: Goal: Ability to manage health-related needs will improve Outcome: Progressing   Problem: Clinical Measurements: Goal: Will remain free from infection Outcome: Progressing   Problem: Activity: Goal: Ability to avoid complications of mobility impairment will improve Outcome: Progressing   Problem: Pain Management: Goal: Pain level will decrease with appropriate interventions Outcome: Progressing   

## 2020-10-30 MED ORDER — ACETAMINOPHEN 325 MG PO TABS: 325.0000 mg | ORAL_TABLET | Freq: Four times a day (QID) | ORAL | 1 refills | Status: DC | PRN

## 2020-10-30 MED ORDER — TRAMADOL HCL 50 MG PO TABS: 50.0000 mg | ORAL_TABLET | Freq: Four times a day (QID) | ORAL | 0 refills | Status: DC | PRN

## 2020-10-30 NOTE — Discharge Summary (Signed)
Physician Discharge Summary  Patient ID: Megan Bradley MRN: [id number] DOB/AGE: 1940/08/05 80 y.o.  Admit date: 10/25/2020 Discharge date: 10/30/2020  Admission Diagnoses:  Right Knee Osteoarthritis <principal problem not specified>  Discharge Diagnoses:  Right Knee Osteoarthritis Active Problems:   S/P TKR (total knee replacement) using cement, right   S/P TKR (total knee replacement)   Past Medical History:  Diagnosis Date  . Anxiety   . Anxiety disorder   . Aortic atherosclerosis ([place])   . Arthritis    both knees  . Cervical dysplasia    a. 2010 - high grade squamous intraepithelial lesion s/p excision.  . Chest pain    a. 2006 or 2007 [facility]): reportedly nl cors;  b. 09/2011 ETT: nl.  . DOE (dyspnea on exertion)   . Family history of adverse reaction to anesthesia    grand daughter was hard to wake up after back surgery  . GERD (gastroesophageal reflux disease)   . Grade II diastolic dysfunction   . Herpes zoster   . Hypothyroidism   . Mild aortic regurgitation   . Nutcracker esophagus   . Orthostatic hypotension   . Pneumonia   . Pre-diabetes   . Sleep apnea    does not use cpap, did not tolerate  . Vertigo   . Wears dentures    partial lower    Surgeries: Procedure(s): RIGHT TOTAL KNEE ARTHROPLASTY on 10/25/2020   Consultants (if any):   Discharged Condition: Improved  Hospital Course: Megan Bradley is an 80 y.o. female who was admitted 10/25/2020 with a diagnosis of  Right Knee Osteoarthritis and went to the operating room on 10/25/2020 and underwent an uncomplicated right total knee arthroplasty.    She was given perioperative antibiotics:  Anti-infectives (From admission, onward)   Start     Dose/Rate Route Frequency Ordered Stop   10/25/20 2345  ceFAZolin (ANCEF) IVPB 2g/100 mL premix        2 g 200 mL/hr over 30 Minutes Intravenous  Once 10/25/20 2246 10/26/20 0431   10/25/20 1330  ceFAZolin (ANCEF) IVPB 2g/100 mL premix        2 g 200 mL/hr  over 30 Minutes Intravenous Every 6 hours 10/25/20 1329 10/25/20 1406   10/25/20 0644  clindamycin (CLEOCIN) 600 MG/50ML IVPB       Note to Pharmacy: [place] [facility], [place]: cabinet override      10/25/20 0644 10/25/20 0829   10/25/20 0644  ceFAZolin (ANCEF) 2-4 GM/100ML-% IVPB       Note to Pharmacy: [place] [facility], [place]: cabinet override      10/25/20 0644 10/25/20 1406   10/25/20 0600  ceFAZolin (ANCEF) IVPB 2g/100 mL premix        2 g 200 mL/hr over 30 Minutes Intravenous On call to O.R. 10/25/20 0112 10/25/20 0834   10/25/20 0115  clindamycin (CLEOCIN) IVPB 600 mg        600 mg 100 mL/hr over 30 Minutes Intravenous  Once 10/25/20 0112 10/25/20 0829    .  She was given sequential compression devices, early ambulation, and lovenox for DVT prophylaxis.  Patient was admitted postop to the orthopedic floor.  She received physical therapy here in the hospital and progressed slowly initially.  Pain and dizziness were limiting her progression.  Her pain and dizziness improved during the hospitalization.  On postop day #5 she was doing well enough with inpatient physical therapy for them to recommend the patient to go home with home health PT.  She benefited  maximally from the hospital stay and there were no complications.    Recent vital signs:  Vitals:   10/30/20 0751 10/30/20 1216  BP: 117/60 (!) 124/54  Pulse: 70 68  Resp: 17 17  Temp: 98.4 F (36.9 C) 98.5 F (36.9 C)  SpO2: 94% 95%    Recent laboratory studies:  Lab Results  Component Value Date   HGB 8.7 (L) 10/27/2020   HGB 8.9 (L) 10/26/2020   HGB 11.4 (L) 09/26/2020   Lab Results  Component Value Date   WBC 14.0 (H) 10/27/2020   PLT 124 (L) 10/27/2020   Lab Results  Component Value Date   INR 1.0 09/26/2020   Lab Results  Component Value Date   NA 141 10/26/2020   K 3.8 10/26/2020   CL 110 10/26/2020   CO2 25 10/26/2020   BUN 20 10/26/2020   CREATININE 0.90 10/26/2020   GLUCOSE 140 (H) 10/26/2020     Discharge Medications:   Allergies as of 10/30/2020      Reactions   Codeine Nausea And Vomiting   Fentanyl Nausea And Vomiting   Lipitor [atorvastatin Calcium]    Muscle cramps and weakness   Tetracyclines & Related Hives      Medication List    STOP taking these medications   ibuprofen 200 MG tablet Commonly known as: ADVIL     TAKE these medications   acetaminophen 325 MG tablet Commonly known as: TYLENOL Take 1-2 tablets (325-650 mg total) by mouth every 6 (six) hours as needed for mild pain (pain score 1-3 or temp > 100.5).   ALPRAZolam 0.5 MG tablet Commonly known as: XANAX Take 0.5 mg by mouth in the morning and at bedtime.   blood glucose meter kit and supplies Kit Dispense based on patient and insurance preference. Use up to four times daily as directed.   diltiazem 120 MG 24 hr capsule Commonly known as: CARDIZEM CD Take 120 mg by mouth in the morning.   ezetimibe 10 MG tablet Commonly known as: ZETIA Take 10 mg by mouth at bedtime.   furosemide 20 MG tablet Commonly known as: LASIX Take 1 tablet (20 mg total) by mouth daily. What changed:   when to take this  reasons to take this   glucose blood test strip For One touch Verio Flex .  Use to check 1 times daily e11.9   hyoscyamine 0.125 MG SL tablet Commonly known as: LEVSIN SL Place 0.125 mg under the tongue every 4 (four) hours as needed (abdominal pain/spasm (jackhammer esophagus)).   levothyroxine 112 MCG tablet Commonly known as: SYNTHROID Take 112 mcg by mouth in the morning.   meclizine 25 MG tablet Commonly known as: ANTIVERT Take 25 mg by mouth 3 (three) times daily as needed (vertigo/dizziness).   omeprazole 40 MG capsule Commonly known as: PRILOSEC Take 40 mg by mouth in the morning and at bedtime.   rosuvastatin 20 MG tablet Commonly known as: CRESTOR Take 1 tablet by mouth once daily What changed:   when to take this  additional instructions   sertraline 100 MG  tablet Commonly known as: ZOLOFT Take 200 mg by mouth in the morning.   traMADol 50 MG tablet Commonly known as: ULTRAM Take 1 tablet (50 mg total) by mouth every 6 (six) hours as needed for moderate pain.            Durable Medical Equipment  (From admission, onward)         Start  Ordered   10/30/20 1003  For home use only DME Walker rolling  Once       Question Answer Comment  Walker: With 5 Inch Wheels   Patient needs a walker to treat with the following condition Weakness      10/30/20 1002          Diagnostic Studies: DG Knee 1-2 Views Right  Result Date: 10/25/2020 CLINICAL DATA:  Status post total knee replacement EXAM: RIGHT KNEE - 1-2 VIEW COMPARISON:  None. FINDINGS: Frontal and lateral views were obtained. Patient is status post total knee replacement with prosthetic components well-seated. No fracture or dislocation. Air within the joint is noted. There are overlying skin staples. IMPRESSION: Status post total knee replacement with prosthetic components well-seated. No fracture or dislocation. Acute postoperative changes noted. Electronically Signed   By: Lowella Grip III M.D.   On: 10/25/2020 12:36    Disposition: Discharge disposition: 01-Home or Self Care       Discharge Instructions    Call MD / Call 911   Complete by: As directed    If you experience chest pain or shortness of breath, CALL 911 and be transported to the hospital emergency room.  If you develope a fever above 101 F, pus (white drainage) or increased drainage or redness at the wound, or calf pain, call your surgeon's office.   Constipation Prevention   Complete by: As directed    Drink plenty of fluids.  Prune juice may be helpful.  You may use a stool softener, such as Colace (over the counter) 100 mg twice a day.  Use MiraLax (over the counter) for constipation as needed.   Diet general   Complete by: As directed    Discharge instructions   Complete by: As directed    The  patient may continue to bear weight on the right lower extremity with use of a walker. The patient should continue to use TED stockings until follow-up. Patient should remove the TED stockings at night for sleep. The patient needs to continue to elevate the right lower extremity whenever possible. The knee immobilizer should be used at night. The patient may remove the knee immobilizer to perform exercises or sit in a chair during the day.  Patient should not place a pillow under their knee. The Polar Care may be used by the patient for comfort.  The dressing should remain on until follow up in the office.   The patient must cover the right knee dressing/incision during showers with a plastic bag or Saran wrap.  The patient will take aspirin 325 mg by mouth twice a day for blood clot prevention and continue to work on knee range of motion exercises at home as instructed by physical therapy until follow-up in the office.   Driving restrictions   Complete by: As directed    No driving for 7-34 weeks   Increase activity slowly as tolerated   Complete by: As directed    Lifting restrictions   Complete by: As directed    No lifting for 12-16 weeks   Post-operative opioid taper instructions:   Complete by: As directed    POST-OPERATIVE OPIOID TAPER INSTRUCTIONS: It is important to wean off of your opioid medication as soon as possible. If you do not need pain medication after your surgery it is ok to stop day one. Opioids include: Codeine, Hydrocodone(Norco, Vicodin), Oxycodone(Percocet, oxycontin) and hydromorphone amongst others.  Long term and even short term use of opiods can cause:  Increased pain response Dependence Constipation Depression Respiratory depression And more.  Withdrawal symptoms can include Flu like symptoms Nausea, vomiting And more Techniques to manage these symptoms Hydrate well Eat regular healthy meals Stay active Use relaxation techniques(deep breathing, meditating,  yoga) Do Not substitute Alcohol to help with tapering If you have been on opioids for less than two weeks and do not have pain than it is ok to stop all together.  Plan to wean off of opioids This plan should start within one week post op of your joint replacement. Maintain the same interval or time between taking each dose and first decrease the dose.  Cut the total daily intake of opioids by one tablet each day Next start to increase the time between doses. The last dose that should be eliminated is the evening dose.          Contact information for after-discharge care    Destination    [facility] Preferred SNF .   Service: Skilled Nursing Contact information: [address] [place] [phone]                   Signed: Thornton Park ,MD 10/30/2020, 1:14 PM

## 2020-10-30 NOTE — Progress Notes (Signed)
  Subjective:  POD #5 s/p right total knee arthroplasty.   Patient reports right knee pain as mild.  I have spoken with the patient's physical therapist, Julaine Fusi, who states the patient did well today.  He has no concerns about the safety of her going home.  Patient denies any dizziness with physical therapy today.  Objective:   VITALS:   Vitals:   10/29/20 1941 10/30/20 0432 10/30/20 0751 10/30/20 1216  BP: (!) 105/54 113/61 117/60 (!) 124/54  Pulse: 71 67 70 68  Resp: 16 16 17 17   Temp: 98.3 F (36.8 C) 99.4 F (37.4 C) 98.4 F (36.9 C) 98.5 F (36.9 C)  TempSrc: Oral Oral Oral Oral  SpO2: 94% 91% 94% 95%  Weight:      Height:        PHYSICAL EXAM: Right lower extremity: I personally change the patient's dressing today.  Her incision is clean dry and intact.  A new Aquacel dressing was applied.  Patient is resolving ecchymosis but no erythema.  There is no drainage from her incision.  Patient's compartments are soft and compressible.  She has palpable pedal pulses, intact sensation light touch and intact motor function distally without weakness.   LABS  No results found for this or any previous visit (from the past 24 hour(s)).  No results found.  Assessment/Plan: 5 Days Post-Op   Active Problems:   S/P TKR (total knee replacement) using cement, right   S/P TKR (total knee replacement)  Patient is ready for discharge home today.  She will receive home health PT.  Patient will follow-up in my office on 11-09-2020 4:15 PM   Thornton Park , MD 10/30/2020, 1:05 PM

## 2020-10-30 NOTE — TOC Progression Note (Signed)
Transition of Care [facility]) - Progression Note    Patient Details  Name: Megan Bradley MRN: [id number] Date of Birth: Mar 24, 1941  [facility]) CM/SW [place], RN Phone Number: 10/30/2020, 10:07 AM  Clinical Narrative:   [facility] has accepted the patient for hh PT         Expected Discharge Plan and Services                                                 Social Determinants of Health (SDOH) Interventions    Readmission Risk Interventions No flowsheet data found.

## 2020-10-30 NOTE — Progress Notes (Signed)
Physical Therapy Treatment Patient Details Name: Megan Bradley MRN: [id number] DOB: 07-10-1940 Today's Date: 10/30/2020    History of Present Illness Pt is a 80 y/o F who underwent scheduled R TKA on 10/25/20 by Dr. Mack Guise. PMH: anxiety, arthritis, cervical dysplasia, DOE, GERD, hypothyroidism, orthostatic hypotension, pre-diabetes, sleep apnea (does not use cpap), vertigo    PT Comments    Pt was long sitting in bed upon arriving. She is A and O x 4 and reports wanting to DC home from acute hospital. Supportive souse present and helpful throughout. She was able to exit bed, stand, and ambulate with min to no physical assistance. Ambulated into bathroom prior to ambulating ~ 50 ft with RW without LOB or unsteadiness. Distance was limited by fatigue. Overall pt is progressing well. Acute PT changing recs to home with HHPT at DC. Secured chat MD for pt's improvements and wanting to go home.     Follow Up Recommendations  Home health PT;Supervision/Assistance - 24 hour;Supervision for mobility/OOB     Equipment Recommendations  None recommended by PT (pt has recieved personal equipment)       Precautions / Restrictions Precautions Precautions: Fall;Knee Precaution Booklet Issued: Yes (comment) Restrictions Weight Bearing Restrictions: Yes RLE Weight Bearing: Weight bearing as tolerated    Mobility  Bed Mobility Overal bed mobility: Needs Assistance Bed Mobility: Supine to Sit;Sit to Supine     Supine to sit: Min guard Sit to supine: Min guard   General bed mobility comments: CGA for safety. overall no lifting assistance to get into/out of bed    Transfers Overall transfer level: Needs assistance Equipment used: Rolling walker (2 wheeled) Transfers: Sit to/from Stand Sit to Stand: Min assist;Min guard         General transfer comment: Min assist from lowest bed height. CGA for elevated seats with armrest. Spouse and pt both feel they can safely perform at home. Did issue  gait belt.  Ambulation/Gait Ambulation/Gait assistance: Supervision Gait Distance (Feet): 50 Feet Assistive device: Rolling walker (2 wheeled) Gait Pattern/deviations: Step-through pattern Gait velocity: decreased   General Gait Details: Pt requested not to ambulate further distances 2/2 to fatigue. she was steady throughout gait without LOB or safety concern.      Balance Overall balance assessment: Needs assistance Sitting-balance support: Feet supported;Bilateral upper extremity supported Sitting balance-Leahy Scale: Good     Standing balance support: During functional activity;Bilateral upper extremity supported Standing balance-Leahy Scale: Good         Cognition Arousal/Alertness: Awake/alert Behavior During Therapy: WFL for tasks assessed/performed Overall Cognitive Status: Within Functional Limits for tasks assessed        General Comments: Pt is A and O x 4. Coopertaive and mtiovated. supportivespouse present throughout      Exercises Total Joint Exercises Ankle Circles/Pumps: AROM;Right;10 reps;Supine Quad Sets: AROM;Strengthening;Right;10 reps;Supine     Pertinent Vitals/Pain Pain Assessment: 0-10 Pain Score: 2  Faces Pain Scale: Hurts a little bit Pain Location: R knee Pain Descriptors / Indicators: Grimacing;Guarding Pain Intervention(s): Limited activity within patient's tolerance;Monitored during session;Premedicated before session;Repositioned;Ice applied           PT Goals (current goals can now be found in the care plan section) Acute Rehab PT Goals Patient Stated Goal: reduced R knee pain Progress towards PT goals: Progressing toward goals    Frequency    BID      PT Plan Discharge plan needs to be updated       AM-PAC PT "6 Clicks" Mobility  Outcome Measure  Help needed turning from your back to your side while in a flat bed without using bedrails?: A Little Help needed moving from lying on your back to sitting on the side  of a flat bed without using bedrails?: A Little Help needed moving to and from a bed to a chair (including a wheelchair)?: A Little Help needed standing up from a chair using your arms (e.g., wheelchair or bedside chair)?: A Little Help needed to walk in hospital room?: A Little Help needed climbing 3-5 steps with a railing? : A Little 6 Click Score: 18    End of Session Equipment Utilized During Treatment: Right knee immobilizer Activity Tolerance: Patient tolerated treatment well Patient left: in bed;with call bell/phone within reach;with bed alarm set;with SCD's reapplied;with family/visitor present Nurse Communication: Mobility status PT Visit Diagnosis: Unsteadiness on feet (R26.81);Muscle weakness (generalized) (M62.81);Difficulty in walking, not elsewhere classified (R26.2)     Time: 4462-8638 PT Time Calculation (min) (ACUTE ONLY): 41 min  Charges:  $Gait Training: 8-22 mins $Therapeutic Exercise: 8-22 mins $Therapeutic Activity: 8-22 mins                     Julaine Fusi PTA 10/30/20, 10:52 AM

## 2020-10-30 NOTE — TOC Progression Note (Signed)
Transition of Care [facility]) - Progression Note    Patient Details  Name: Megan Bradley MRN: [id number] Date of Birth: 1941-04-03  Transition of Care [facility]) CM/SW [place], RN Phone Number: 10/30/2020, 9:50 AM  Clinical Narrative:   Spoke with the patient about her concerns of going to STR, She has decided it would be best to go home with Home health, She needs a 3 in 1 and a Rolling walker, She will work today with PT again, I reached out to [facility] to request [facility] to [facility] the patient, awaiting a response, Notified rhonda with [facility] of the need for RW and 3 in 1, it will be brought to the room prior to DC, her husband will be at home to assist her, he provides transportation as well, she can afford her medicaitons        Expected Discharge Plan and Services                                                 Social Determinants of Health (SDOH) Interventions    Readmission Risk Interventions No flowsheet data found.

## 2020-10-30 NOTE — TOC Progression Note (Signed)
Transition of Care [facility]) - Progression Note    Patient Details  Name: Megan Bradley MRN: [id number] Date of Birth: 1941-04-23  Transition of Care [facility]) CM/SW [place], RN Phone Number: 10/30/2020, 10:01 AM  Clinical Narrative:     [facility] is unable to accept the patient for [facility]       Expected Discharge Plan and Services                                                 Social Determinants of Health (SDOH) Interventions    Readmission Risk Interventions No flowsheet data found.

## 2020-11-01 DIAGNOSIS — E039 Hypothyroidism, unspecified: Secondary | ICD-10-CM | POA: Diagnosis not present

## 2020-11-01 DIAGNOSIS — R2681 Unsteadiness on feet: Secondary | ICD-10-CM | POA: Diagnosis not present

## 2020-11-01 DIAGNOSIS — Z96651 Presence of right artificial knee joint: Secondary | ICD-10-CM | POA: Diagnosis not present

## 2020-11-01 DIAGNOSIS — Z471 Aftercare following joint replacement surgery: Secondary | ICD-10-CM | POA: Diagnosis not present

## 2020-11-02 ENCOUNTER — Other Ambulatory Visit: Payer: Self-pay | Admitting: Internal Medicine

## 2020-11-02 DIAGNOSIS — R2681 Unsteadiness on feet: Secondary | ICD-10-CM | POA: Diagnosis not present

## 2020-11-02 DIAGNOSIS — Z96651 Presence of right artificial knee joint: Secondary | ICD-10-CM | POA: Diagnosis not present

## 2020-11-02 DIAGNOSIS — Z471 Aftercare following joint replacement surgery: Secondary | ICD-10-CM | POA: Diagnosis not present

## 2020-11-02 DIAGNOSIS — E039 Hypothyroidism, unspecified: Secondary | ICD-10-CM | POA: Diagnosis not present

## 2020-11-02 NOTE — Telephone Encounter (Signed)
RX Refill:xanax Last Seen:09-18-20 Last ordered:unk historical povider

## 2020-11-06 DIAGNOSIS — Z471 Aftercare following joint replacement surgery: Secondary | ICD-10-CM | POA: Diagnosis not present

## 2020-11-06 DIAGNOSIS — E039 Hypothyroidism, unspecified: Secondary | ICD-10-CM | POA: Diagnosis not present

## 2020-11-06 DIAGNOSIS — Z96651 Presence of right artificial knee joint: Secondary | ICD-10-CM | POA: Diagnosis not present

## 2020-11-06 DIAGNOSIS — R2681 Unsteadiness on feet: Secondary | ICD-10-CM | POA: Diagnosis not present

## 2020-11-08 DIAGNOSIS — Z96651 Presence of right artificial knee joint: Secondary | ICD-10-CM | POA: Diagnosis not present

## 2020-11-08 DIAGNOSIS — Z471 Aftercare following joint replacement surgery: Secondary | ICD-10-CM | POA: Diagnosis not present

## 2020-11-08 DIAGNOSIS — E039 Hypothyroidism, unspecified: Secondary | ICD-10-CM | POA: Diagnosis not present

## 2020-11-08 DIAGNOSIS — R2681 Unsteadiness on feet: Secondary | ICD-10-CM | POA: Diagnosis not present

## 2020-11-09 DIAGNOSIS — Z96659 Presence of unspecified artificial knee joint: Secondary | ICD-10-CM | POA: Diagnosis not present

## 2020-11-10 DIAGNOSIS — Z471 Aftercare following joint replacement surgery: Secondary | ICD-10-CM | POA: Diagnosis not present

## 2020-11-10 DIAGNOSIS — E039 Hypothyroidism, unspecified: Secondary | ICD-10-CM | POA: Diagnosis not present

## 2020-11-10 DIAGNOSIS — Z96651 Presence of right artificial knee joint: Secondary | ICD-10-CM | POA: Diagnosis not present

## 2020-11-10 DIAGNOSIS — R2681 Unsteadiness on feet: Secondary | ICD-10-CM | POA: Diagnosis not present

## 2020-11-12 ENCOUNTER — Other Ambulatory Visit: Payer: Self-pay | Admitting: Internal Medicine

## 2020-11-12 DIAGNOSIS — Z96651 Presence of right artificial knee joint: Secondary | ICD-10-CM

## 2020-11-12 MED ORDER — PREDNISONE 10 MG PO TABS: ORAL_TABLET | ORAL | 0 refills | Status: DC

## 2020-11-12 MED ORDER — AMOXICILLIN-POT CLAVULANATE 875-125 MG PO TABS: 1.0000 | ORAL_TABLET | Freq: Two times a day (BID) | ORAL | 0 refills | Status: DC

## 2020-11-15 ENCOUNTER — Encounter (INDEPENDENT_AMBULATORY_CARE_PROVIDER_SITE_OTHER): Payer: Medicare Other

## 2020-11-15 ENCOUNTER — Ambulatory Visit (INDEPENDENT_AMBULATORY_CARE_PROVIDER_SITE_OTHER): Payer: Medicare Other | Admitting: Nurse Practitioner

## 2020-11-16 DIAGNOSIS — R2681 Unsteadiness on feet: Secondary | ICD-10-CM | POA: Diagnosis not present

## 2020-11-16 DIAGNOSIS — Z471 Aftercare following joint replacement surgery: Secondary | ICD-10-CM | POA: Diagnosis not present

## 2020-11-16 DIAGNOSIS — E039 Hypothyroidism, unspecified: Secondary | ICD-10-CM | POA: Diagnosis not present

## 2020-11-16 DIAGNOSIS — Z96651 Presence of right artificial knee joint: Secondary | ICD-10-CM | POA: Diagnosis not present

## 2020-11-17 DIAGNOSIS — R2681 Unsteadiness on feet: Secondary | ICD-10-CM | POA: Diagnosis not present

## 2020-11-17 DIAGNOSIS — Z96651 Presence of right artificial knee joint: Secondary | ICD-10-CM | POA: Diagnosis not present

## 2020-11-17 DIAGNOSIS — Z471 Aftercare following joint replacement surgery: Secondary | ICD-10-CM | POA: Diagnosis not present

## 2020-11-17 DIAGNOSIS — E039 Hypothyroidism, unspecified: Secondary | ICD-10-CM | POA: Diagnosis not present

## 2020-11-21 DIAGNOSIS — Z96651 Presence of right artificial knee joint: Secondary | ICD-10-CM | POA: Diagnosis not present

## 2020-11-21 DIAGNOSIS — Z471 Aftercare following joint replacement surgery: Secondary | ICD-10-CM | POA: Diagnosis not present

## 2020-11-21 DIAGNOSIS — R2681 Unsteadiness on feet: Secondary | ICD-10-CM | POA: Diagnosis not present

## 2020-11-21 DIAGNOSIS — E039 Hypothyroidism, unspecified: Secondary | ICD-10-CM | POA: Diagnosis not present

## 2020-11-26 DIAGNOSIS — G8929 Other chronic pain: Secondary | ICD-10-CM | POA: Insufficient documentation

## 2020-11-26 DIAGNOSIS — Z96651 Presence of right artificial knee joint: Secondary | ICD-10-CM | POA: Insufficient documentation

## 2020-11-26 DIAGNOSIS — M25661 Stiffness of right knee, not elsewhere classified: Secondary | ICD-10-CM | POA: Insufficient documentation

## 2020-11-26 DIAGNOSIS — M25561 Pain in right knee: Secondary | ICD-10-CM | POA: Diagnosis not present

## 2020-11-27 ENCOUNTER — Ambulatory Visit: Payer: Medicare Other | Admitting: Cardiovascular Disease

## 2020-12-05 DIAGNOSIS — M25561 Pain in right knee: Secondary | ICD-10-CM | POA: Diagnosis not present

## 2020-12-05 DIAGNOSIS — M25661 Stiffness of right knee, not elsewhere classified: Secondary | ICD-10-CM | POA: Diagnosis not present

## 2020-12-07 DIAGNOSIS — M25561 Pain in right knee: Secondary | ICD-10-CM | POA: Diagnosis not present

## 2020-12-07 DIAGNOSIS — M25661 Stiffness of right knee, not elsewhere classified: Secondary | ICD-10-CM | POA: Diagnosis not present

## 2020-12-12 ENCOUNTER — Telehealth: Payer: Self-pay | Admitting: Internal Medicine

## 2020-12-12 DIAGNOSIS — M25661 Stiffness of right knee, not elsewhere classified: Secondary | ICD-10-CM | POA: Diagnosis not present

## 2020-12-12 DIAGNOSIS — M25561 Pain in right knee: Secondary | ICD-10-CM | POA: Diagnosis not present

## 2020-12-12 DIAGNOSIS — E119 Type 2 diabetes mellitus without complications: Secondary | ICD-10-CM

## 2020-12-12 DIAGNOSIS — Z862 Personal history of diseases of the blood and blood-forming organs and certain disorders involving the immune mechanism: Secondary | ICD-10-CM

## 2020-12-12 DIAGNOSIS — R0609 Other forms of dyspnea: Secondary | ICD-10-CM

## 2020-12-12 NOTE — Telephone Encounter (Signed)
PT called to state she feels wiped out of energy since her knee surgery and was wanting to know if she could get a b12 shot today.

## 2020-12-12 NOTE — Telephone Encounter (Signed)
Patient scheduled tomorrow for labs first then B-12 injection.

## 2020-12-13 ENCOUNTER — Other Ambulatory Visit: Payer: Self-pay

## 2020-12-13 ENCOUNTER — Other Ambulatory Visit (INDEPENDENT_AMBULATORY_CARE_PROVIDER_SITE_OTHER): Payer: Medicare Other

## 2020-12-13 ENCOUNTER — Ambulatory Visit (INDEPENDENT_AMBULATORY_CARE_PROVIDER_SITE_OTHER): Payer: Medicare Other

## 2020-12-13 DIAGNOSIS — R0600 Dyspnea, unspecified: Secondary | ICD-10-CM | POA: Diagnosis not present

## 2020-12-13 DIAGNOSIS — Z85828 Personal history of other malignant neoplasm of skin: Secondary | ICD-10-CM | POA: Diagnosis not present

## 2020-12-13 DIAGNOSIS — L281 Prurigo nodularis: Secondary | ICD-10-CM | POA: Diagnosis not present

## 2020-12-13 DIAGNOSIS — R0609 Other forms of dyspnea: Secondary | ICD-10-CM

## 2020-12-13 DIAGNOSIS — E538 Deficiency of other specified B group vitamins: Secondary | ICD-10-CM

## 2020-12-13 DIAGNOSIS — D485 Neoplasm of uncertain behavior of skin: Secondary | ICD-10-CM | POA: Diagnosis not present

## 2020-12-13 DIAGNOSIS — L570 Actinic keratosis: Secondary | ICD-10-CM | POA: Diagnosis not present

## 2020-12-13 DIAGNOSIS — D2261 Melanocytic nevi of right upper limb, including shoulder: Secondary | ICD-10-CM | POA: Diagnosis not present

## 2020-12-13 DIAGNOSIS — E119 Type 2 diabetes mellitus without complications: Secondary | ICD-10-CM | POA: Diagnosis not present

## 2020-12-13 DIAGNOSIS — D225 Melanocytic nevi of trunk: Secondary | ICD-10-CM | POA: Diagnosis not present

## 2020-12-13 DIAGNOSIS — Z862 Personal history of diseases of the blood and blood-forming organs and certain disorders involving the immune mechanism: Secondary | ICD-10-CM | POA: Diagnosis not present

## 2020-12-13 DIAGNOSIS — D2262 Melanocytic nevi of left upper limb, including shoulder: Secondary | ICD-10-CM | POA: Diagnosis not present

## 2020-12-13 DIAGNOSIS — X32XXXA Exposure to sunlight, initial encounter: Secondary | ICD-10-CM | POA: Diagnosis not present

## 2020-12-13 DIAGNOSIS — C44519 Basal cell carcinoma of skin of other part of trunk: Secondary | ICD-10-CM | POA: Diagnosis not present

## 2020-12-13 LAB — COMPREHENSIVE METABOLIC PANEL
ALT: 19 U/L (ref 0–35)
AST: 20 U/L (ref 0–37)
Albumin: 4 g/dL (ref 3.5–5.2)
Alkaline Phosphatase: 83 U/L (ref 39–117)
BUN: 10 mg/dL (ref 6–23)
CO2: 26 mEq/L (ref 19–32)
Calcium: 9.5 mg/dL (ref 8.4–10.5)
Chloride: 102 mEq/L (ref 96–112)
Creatinine, Ser: 0.91 mg/dL (ref 0.40–1.20)
GFR: 60.47 mL/min (ref >60.00)
Glucose, Bld: 126 mg/dL — ABNORMAL HIGH (ref 70–99)
Potassium: 4 mEq/L (ref 3.5–5.1)
Sodium: 140 mEq/L (ref 135–145)
Total Bilirubin: 0.5 mg/dL (ref 0.2–1.2)
Total Protein: 7.2 g/dL (ref 6.0–8.3)

## 2020-12-13 LAB — CBC WITH DIFFERENTIAL/PLATELET
Basophils Absolute: 0 10*3/uL (ref 0.0–0.1)
Basophils Relative: 0.5 % (ref 0.0–3.0)
Eosinophils Absolute: 0.1 10*3/uL (ref 0.0–0.7)
Eosinophils Relative: 1.5 % (ref 0.0–5.0)
HCT: 34.2 % — ABNORMAL LOW (ref 36.0–46.0)
Hemoglobin: 11.3 g/dL — ABNORMAL LOW (ref 12.0–15.0)
Lymphocytes Relative: 19.2 % (ref 12.0–46.0)
Lymphs Abs: 1.3 10*3/uL (ref 0.7–4.0)
MCHC: 33 g/dL (ref 30.0–36.0)
MCV: 91.5 fl (ref 78.0–100.0)
Monocytes Absolute: 0.4 10*3/uL (ref 0.1–1.0)
Monocytes Relative: 6.2 % (ref 3.0–12.0)
Neutro Abs: 4.9 10*3/uL (ref 1.4–7.7)
Neutrophils Relative %: 72.6 % (ref 43.0–77.0)
Platelets: 195 10*3/uL (ref 150.0–400.0)
RBC: 3.73 Mil/uL — ABNORMAL LOW (ref 3.87–5.11)
RDW: 17.9 % — ABNORMAL HIGH (ref 11.5–15.5)
WBC: 6.8 10*3/uL (ref 4.0–10.5)

## 2020-12-13 LAB — VITAMIN B12: Vitamin B-12: 396 pg/mL (ref 211–911)

## 2020-12-13 LAB — TSH: TSH: 5.08 u[IU]/mL (ref 0.35–5.50)

## 2020-12-13 LAB — HEMOGLOBIN A1C: Hgb A1c MFr Bld: 6.3 % (ref 4.6–6.5)

## 2020-12-13 MED ORDER — CYANOCOBALAMIN 1000 MCG/ML IJ SOLN
1000.0000 ug | Freq: Once | INTRAMUSCULAR | Status: AC
  Administered 2020-12-13: 1000 ug via INTRAMUSCULAR

## 2020-12-13 NOTE — Progress Notes (Signed)
Patient presented for B 12 injection to left deltoid, patient voiced no concerns nor showed any signs of distress during injection. 

## 2020-12-14 DIAGNOSIS — M25561 Pain in right knee: Secondary | ICD-10-CM | POA: Diagnosis not present

## 2020-12-14 DIAGNOSIS — M25661 Stiffness of right knee, not elsewhere classified: Secondary | ICD-10-CM | POA: Diagnosis not present

## 2020-12-14 LAB — IRON,TIBC AND FERRITIN PANEL
%SAT: 17 % (calc) (ref 16–45)
Ferritin: 344 ng/mL — ABNORMAL HIGH (ref 16–288)
Iron: 53 ug/dL (ref 45–160)
TIBC: 303 mcg/dL (calc) (ref 250–450)

## 2020-12-17 DIAGNOSIS — M25561 Pain in right knee: Secondary | ICD-10-CM | POA: Diagnosis not present

## 2020-12-17 DIAGNOSIS — M25661 Stiffness of right knee, not elsewhere classified: Secondary | ICD-10-CM | POA: Diagnosis not present

## 2020-12-20 ENCOUNTER — Telehealth: Payer: Self-pay

## 2020-12-20 ENCOUNTER — Other Ambulatory Visit: Payer: Self-pay | Admitting: Internal Medicine

## 2020-12-20 MED ORDER — FUROSEMIDE 20 MG PO TABS: 20.0000 mg | ORAL_TABLET | Freq: Every day | ORAL | 0 refills | Status: DC | PRN

## 2020-12-20 NOTE — Telephone Encounter (Signed)
Requested Prescriptions   Signed Prescriptions Disp Refills   furosemide (LASIX) 20 MG tablet 90 tablet 0    Sig: Take 1 tablet (20 mg total) by mouth daily as needed (swelling/fluid retention).    Authorizing Provider: Loel Dubonnet    Ordering User: Janan Ridge

## 2020-12-24 DIAGNOSIS — M25561 Pain in right knee: Secondary | ICD-10-CM | POA: Diagnosis not present

## 2020-12-24 DIAGNOSIS — M25661 Stiffness of right knee, not elsewhere classified: Secondary | ICD-10-CM | POA: Diagnosis not present

## 2020-12-26 DIAGNOSIS — M25561 Pain in right knee: Secondary | ICD-10-CM | POA: Diagnosis not present

## 2020-12-26 DIAGNOSIS — M25661 Stiffness of right knee, not elsewhere classified: Secondary | ICD-10-CM | POA: Diagnosis not present

## 2020-12-31 ENCOUNTER — Other Ambulatory Visit: Payer: Self-pay | Admitting: Internal Medicine

## 2020-12-31 NOTE — Progress Notes (Signed)
Cardiology Office Note  Date:  01/01/2021   ID:  Megan Bradley, DOB 06-Aug-1940, MRN [id number]  PCP:  Crecencio Mc, MD   Chief Complaint  Patient presents with   Other    2 month f/u c/o knee swelling and ankles. Meds reviewed verbally with pt.    HPI:  Megan Bradley is a 80 year old woman past medical history of  syncope , symptoms improved by holding  lisinopril and HCTZ.   chronic chest pain felt secondary to spasm,  previous cardiac catheterization with no coronary disease into 2007,   She presents for routine followup of her chest pain, shortness of breath  Last seen in clinic January 2022 On that visit reported leg weakness, sedentary lifestyle Was doing home PT  In follow-up today she reports having worsening leg swelling She has not been taking Lasix Weight down 12 pounds, dietary changes  Knee surgery 10/2020 10/25/2020 and underwent an uncomplicated right total knee arthroplasty.  Had delerium HGB 8.7, Has recovered: 11.3  EKG personally reviewed by myself on todays visit normal sinus rhythm with rate 70 beats per minute with right bundle branch block, no other significant ST or T wave changes   Other past medical history reviewed  chest pain 2013, normal CT scan chest, normal exercise treadmill test, Normal LV function by echocardiogram   Previous echocardiogram many years ago showed diastolic dysfunction with normal LV function    PMH:   has a past medical history of Anxiety, Anxiety disorder, Aortic atherosclerosis ([place]), Arthritis, Cervical dysplasia, Chest pain, DOE (dyspnea on exertion), Family history of adverse reaction to anesthesia, GERD (gastroesophageal reflux disease), Grade II diastolic dysfunction, Herpes zoster, Hypothyroidism, Mild aortic regurgitation, Nutcracker esophagus, Orthostatic hypotension, Pneumonia, Pre-diabetes, Sleep apnea, Vertigo, and Wears dentures.  PSH:    Past Surgical History:  Procedure Laterality Date   AUGMENTATION MAMMAPLASTY  Bilateral    BREAST EXCISIONAL BIOPSY Left    CARDIAC CATHETERIZATION  2003   Dr.Callwood, R/L heart cath,   COLONOSCOPY WITH PROPOFOL N/A 06/12/2017   Procedure: COLONOSCOPY WITH PROPOFOL;  Surgeon: Lucilla Lame, MD;  Location: [place];  Service: Endoscopy;  Laterality: N/A;   dental work     ESOPHAGEAL MANOMETRY N/A 06/19/2016   Procedure: ESOPHAGEAL MANOMETRY (EM);  Surgeon: Lucilla Lame, MD;  Location: [facility];  Service: Endoscopy;  Laterality: N/A;   ESOPHAGOGASTRODUODENOSCOPY (EGD) WITH PROPOFOL N/A 04/03/2017   Procedure: ESOPHAGOGASTRODUODENOSCOPY (EGD) WITH PROPOFOL;  Surgeon: Lucilla Lame, MD;  Location: [place];  Service: Endoscopy;  Laterality: N/A;  diabetic - diet controlled   THYROIDECTOMY  05/15/15   [facility]   TONSILLECTOMY     TOTAL KNEE ARTHROPLASTY Right 10/25/2020   Procedure: RIGHT TOTAL KNEE ARTHROPLASTY;  Surgeon: Thornton Park, MD;  Location: [facility];  Service: Orthopedics;  Laterality: Right;    Current Outpatient Medications  Medication Sig Dispense Refill   ALPRAZolam (XANAX) 0.5 MG tablet Take 1 tablet by mouth twice daily as needed for anxiety 60 tablet 0   diltiazem (CARDIZEM CD) 120 MG 24 hr capsule Take 120 mg by mouth in the morning.     ezetimibe (ZETIA) 10 MG tablet TAKE 1 TABLET BY MOUTH  DAILY 90 tablet 3   furosemide (LASIX) 20 MG tablet Take 1 tablet (20 mg total) by mouth daily as needed (swelling/fluid retention). 90 tablet 0   hyoscyamine (LEVSIN SL) 0.125 MG SL tablet Place 0.125 mg under the tongue every 4 (four) hours as needed (abdominal pain/spasm (jackhammer esophagus)).  levothyroxine (SYNTHROID) 112 MCG tablet Take 112 mcg by mouth in the morning.     meclizine (ANTIVERT) 25 MG tablet TAKE 1 TABLET BY MOUTH THREE TIMES DAILY AS NEEDED FOR VERTIGO 30 tablet 0   omeprazole (PRILOSEC) 40 MG capsule Take 40 mg by mouth in the morning and at bedtime.     rosuvastatin (CRESTOR) 20 MG tablet Take 1 tablet by mouth  once daily (Patient taking differently: Take 20 mg by mouth every other day. In the morning) 90 tablet 0   sertraline (ZOLOFT) 100 MG tablet Take 200 mg by mouth in the morning.     No current facility-administered medications for this visit.   Facility-Administered Medications Ordered in Other Visits  Medication Dose Route Frequency Provider Last Rate Last Admin   clindamycin (CLEOCIN) IVPB 600 mg  600 mg Intravenous Once Thornton Park, MD         Allergies:   Codeine, Fentanyl, Lipitor [atorvastatin calcium], and Tetracyclines & related   Social History:  The patient  reports that she has never smoked. She has never used smokeless tobacco. She reports current alcohol use. She reports that she does not use drugs.   Family History:   family history includes Alcohol abuse in her mother; Colon cancer (age of onset: 68) in her paternal grandmother; Heart attack in her father and mother; Heart disease (age of onset: 47) in her father; Heart disease (age of onset: 32) in her mother; Mental illness in her mother.    Review of Systems: Review of Systems  Constitutional: Negative.   HENT: Negative.    Cardiovascular:  Positive for leg swelling.  Gastrointestinal: Negative.   Musculoskeletal: Negative.   Neurological: Negative.   Psychiatric/Behavioral: Negative.    All other systems reviewed and are negative.   PHYSICAL EXAM: VS:  BP (!) 118/58 (BP Location: Left Arm, Patient Position: Sitting, Cuff Size: Normal)   Pulse 70   Ht 5' 5.5" (1.664 m)   Wt 138 lb 4 oz (62.7 kg)   SpO2 96%   BMI 22.66 kg/m  , BMI Body mass index is 22.66 kg/m. GEN: Well nourished, well developed, in no acute distress HEENT: normal Neck: no JVD, carotid bruits, or masses Cardiac: RRR; no murmurs, rubs, or gallops, Trace to 1+ pitting lower extremity edema to below the knees Respiratory:  clear to auscultation bilaterally, normal work of breathing GI: soft, nontender, nondistended, + BS MS: no  deformity or atrophy Skin: warm and dry, no rash Neuro:  Strength and sensation are intact Psych: euthymic mood, full affect   Recent Labs: 12/13/2020: ALT 19; BUN 10; Creatinine, Ser 0.91; Hemoglobin 11.3; Platelets 195.0; Potassium 4.0; Sodium 140; TSH 5.08    Lipid Panel Lab Results  Component Value Date   CHOL 191 05/14/2020   HDL 73.70 05/14/2020   LDLCALC 106 (H) 05/14/2020   TRIG 56.0 05/14/2020      Wt Readings from Last 3 Encounters:  01/01/21 138 lb 4 oz (62.7 kg)  10/25/20 150 lb 9.2 oz (68.3 kg)  09/27/20 149 lb (67.6 kg)     ASSESSMENT AND PLAN:  Problem List Items Addressed This Visit       Cardiology Problems   Hyperlipidemia   Aortic atherosclerosis ([place]) - Primary   Relevant Orders   EKG 12-Lead     Other   Bilateral lower extremity edema   Exertional dyspnea   Other Visit Diagnoses     Chronic diastolic heart failure ([place])  Relevant Orders   EKG 12-Lead   Benign essential HTN         Pure hypercholesterolemia Tolerating Zetia daily Taking Crestor 20 every other day No changes made  Aortic arch atherosclerosis ([facility]) Prior CT scan images no significant coronary calcifications, no calcified plaque noted in the ascending or descending aorta No further work-up   Atypical chest pain Reports symptoms typically well controlled with Levsin No further work-up, denies recent symptoms   Diabetes mellitus without complication ([place]) Recommend she continue exercise/walking program Weight down significantly through dietary changes  Leg edema With some pitting edema noted on exam Normal renal function Recommend she take her Lasix twice daily dosing 3 days then 20 mg daily until she sees Dr. Derrel Nip next week After that would likely be able to take Lasix as needed Leg edema likely exacerbated by resolving anemia, dependent edema, high fluid intake   Orthostatic hypotension No recent symptoms, blood pressure stable    Total encounter time  more than 25 minutes  Greater than 50% was spent in counseling and coordination of care with the patient    Signed, Esmond Plants, M.D., Ph.D. [place], [place]

## 2021-01-01 ENCOUNTER — Other Ambulatory Visit: Payer: Self-pay

## 2021-01-01 ENCOUNTER — Encounter: Payer: Self-pay | Admitting: Cardiovascular Disease

## 2021-01-01 ENCOUNTER — Ambulatory Visit: Payer: Medicare Other | Admitting: Cardiovascular Disease

## 2021-01-01 VITALS — BP 118/58 | HR 70 | Ht 65.5 in | Wt 138.2 lb

## 2021-01-01 DIAGNOSIS — I10 Essential (primary) hypertension: Secondary | ICD-10-CM

## 2021-01-01 DIAGNOSIS — E782 Mixed hyperlipidemia: Secondary | ICD-10-CM

## 2021-01-01 DIAGNOSIS — I700 Atherosclerosis of aorta: Secondary | ICD-10-CM

## 2021-01-01 DIAGNOSIS — R600 Localized edema: Secondary | ICD-10-CM | POA: Diagnosis not present

## 2021-01-01 DIAGNOSIS — R0600 Dyspnea, unspecified: Secondary | ICD-10-CM

## 2021-01-01 DIAGNOSIS — R0609 Other forms of dyspnea: Secondary | ICD-10-CM

## 2021-01-01 DIAGNOSIS — I5032 Chronic diastolic (congestive) heart failure: Secondary | ICD-10-CM | POA: Diagnosis not present

## 2021-01-01 MED ORDER — POTASSIUM CHLORIDE ER 10 MEQ PO TBCR: 10.0000 meq | EXTENDED_RELEASE_TABLET | Freq: Every day | ORAL | 3 refills | Status: DC | PRN

## 2021-01-01 NOTE — Telephone Encounter (Signed)
Left a message to ask if pt is still taking Hyoscyamine and who last refilled the medication.

## 2021-01-01 NOTE — Patient Instructions (Addendum)
Compression hose   Medication Instructions:  Please take lasix 2 pills for three days  Then once a day until swelling goes away Once swelling better, just take lasix as needed  Take one potassium for every lasix pill  Please call if symptoms not improve  If you need a refill on your cardiac medications before your next appointment, please call your pharmacy.    Lab work: No new labs needed   If you have labs (blood work) drawn today and your tests are completely normal, you will receive your results only by: [facility] (if you have MyChart) OR A paper copy in the mail If you have any lab test that is abnormal or we need to change your treatment, we will call you to review the results.   Testing/Procedures: No new testing needed   Follow-Up: At [facility], you and your health needs are our priority.  As part of our continuing mission to provide you with exceptional heart care, we have created designated Provider Care Teams.  These Care Teams include your primary Cardiologist (physician) and Advanced Practice Providers (APPs -  Physician Assistants and Nurse Practitioners) who all work together to provide you with the care you need, when you need it.  You will need a follow up appointment in 6 months  Providers on your designated Care Team:   Murray Hodgkins, NP Christell Faith, PA-C Marrianne Mood, PA-C Cadence Kathlen Mody, [place]  Any Other Special Instructions Will Be Listed Below (If Applicable).  COVID-19 Vaccine Information can be found at: [email or web address] For questions related to vaccine distribution or appointments, please email [email or web address] or call [phone].

## 2021-01-02 DIAGNOSIS — M25661 Stiffness of right knee, not elsewhere classified: Secondary | ICD-10-CM | POA: Diagnosis not present

## 2021-01-02 DIAGNOSIS — M25561 Pain in right knee: Secondary | ICD-10-CM | POA: Diagnosis not present

## 2021-01-07 ENCOUNTER — Ambulatory Visit (INDEPENDENT_AMBULATORY_CARE_PROVIDER_SITE_OTHER): Payer: Medicare Other | Admitting: Internal Medicine

## 2021-01-07 ENCOUNTER — Other Ambulatory Visit: Payer: Self-pay

## 2021-01-07 ENCOUNTER — Encounter: Payer: Self-pay | Admitting: Internal Medicine

## 2021-01-07 VITALS — BP 106/68 | HR 83 | Temp 95.6°F | Resp 15 | Ht 65.5 in | Wt 137.8 lb

## 2021-01-07 DIAGNOSIS — D649 Anemia, unspecified: Secondary | ICD-10-CM | POA: Diagnosis not present

## 2021-01-07 DIAGNOSIS — E1169 Type 2 diabetes mellitus with other specified complication: Secondary | ICD-10-CM

## 2021-01-07 DIAGNOSIS — E032 Hypothyroidism due to medicaments and other exogenous substances: Secondary | ICD-10-CM | POA: Diagnosis not present

## 2021-01-07 DIAGNOSIS — D62 Acute posthemorrhagic anemia: Secondary | ICD-10-CM | POA: Diagnosis not present

## 2021-01-07 DIAGNOSIS — E538 Deficiency of other specified B group vitamins: Secondary | ICD-10-CM

## 2021-01-07 DIAGNOSIS — L981 Factitial dermatitis: Secondary | ICD-10-CM

## 2021-01-07 DIAGNOSIS — Z862 Personal history of diseases of the blood and blood-forming organs and certain disorders involving the immune mechanism: Secondary | ICD-10-CM

## 2021-01-07 DIAGNOSIS — E785 Hyperlipidemia, unspecified: Secondary | ICD-10-CM | POA: Diagnosis not present

## 2021-01-07 DIAGNOSIS — R0789 Other chest pain: Secondary | ICD-10-CM | POA: Diagnosis not present

## 2021-01-07 DIAGNOSIS — E119 Type 2 diabetes mellitus without complications: Secondary | ICD-10-CM

## 2021-01-07 DIAGNOSIS — I700 Atherosclerosis of aorta: Secondary | ICD-10-CM

## 2021-01-07 DIAGNOSIS — Z96651 Presence of right artificial knee joint: Secondary | ICD-10-CM | POA: Diagnosis not present

## 2021-01-07 NOTE — Progress Notes (Addendum)
Subjective:  Patient ID: Megan Bradley, female    DOB: 09/21/1940  Age: 80 y.o. MRN: [id number]  CC: The primary encounter diagnosis was Acute blood loss as cause of postoperative anemia. Diagnoses of B12 deficiency, Iatrogenic hypothyroidism, Diabetes mellitus without complication ([place]), Hyperlipidemia associated with type 2 diabetes mellitus ([place]), Other chest pain, Aortic atherosclerosis ([place]), History of pernicious anemia, S/P TKR (total knee replacement) using cement, right, and Neurotic excoriations were also pertinent to this visit.  HPI Megan Bradley presents for follow up on type 2 dn, aortic atherosclerosis with hyperlipidemia,  and hypothyroidism.  This visit occurred during the SARS-CoV-2 public health emergency.  Safety protocols were in place, including screening questions prior to the visit, additional usage of staff PPE, and extensive cleaning of exam room while observing appropriate contact time as indicated for disinfecting solutions.   Since her last visit she has undergone total right knee  replacement on may 19 by KK at [facility].  The surgery was delayed by two weesk dur to cellulitis of the ipsilateral leg caused by patient scratching incessantly.  She was kept in house  at  [facility]  for several days before being discharged home.  No DC summary in Epic.  She does not remember much about the hospitalization except that she was heavily sedated and did not have a functioning call bell or telephone  She reports that her recovery has been more difficult than she anticipated, but has not used opioids since discharge by choice and has been using tylenol.    She states that the PT is going well,  but she cannot fully extend the knee yet.  (Notes in chart from PT)   CMA noted that patient's leg buckled on her today as she was accompanying her to the exam room. Patient states that this happens "all the time."  Knee appears swollen, but not red .   T2DM:  diet controlled historically,   last A1c had dropped and she has lost 15 lbs compared to last visit.  Appetite has been diminished   Lab Results  Component Value Date   HGBA1C 6.3 12/13/2020    Pruritus neurotic prescribed by Dr. [name] .  Has been prescribed daily Furosemide and potassium  by her cardiologist Dr. Rockey Situ ,  but only taken it once since last week .     Outpatient Medications Prior to Visit  Medication Sig Dispense Refill   ALPRAZolam (XANAX) 0.5 MG tablet Take 1 tablet by mouth twice daily as needed for anxiety 60 tablet 0   diltiazem (CARDIZEM CD) 120 MG 24 hr capsule Take 120 mg by mouth in the morning.     ezetimibe (ZETIA) 10 MG tablet TAKE 1 TABLET BY MOUTH  DAILY 90 tablet 3   furosemide (LASIX) 20 MG tablet Take 1 tablet (20 mg total) by mouth daily as needed (swelling/fluid retention). 90 tablet 0   hyoscyamine (LEVSIN SL) 0.125 MG SL tablet DISSOLVE 1 TABLET UNDER THE TONGUE EVERY 4 HOURS AS NEEDED 30 tablet 0   levothyroxine (SYNTHROID) 112 MCG tablet Take 112 mcg by mouth in the morning.     meclizine (ANTIVERT) 25 MG tablet TAKE 1 TABLET BY MOUTH THREE TIMES DAILY AS NEEDED FOR VERTIGO 30 tablet 0   omeprazole (PRILOSEC) 40 MG capsule Take 40 mg by mouth in the morning and at bedtime.     potassium chloride (KLOR-CON) 10 MEQ tablet Take 1 tablet (10 mEq total) by mouth daily as needed (with lasix). [place]  tablet 3   rosuvastatin (CRESTOR) 20 MG tablet Take 1 tablet by mouth once daily (Patient taking differently: Take 20 mg by mouth every other day. In the morning) 90 tablet 0   sertraline (ZOLOFT) 100 MG tablet Take 200 mg by mouth in the morning.     Facility-Administered Medications Prior to Visit  Medication Dose Route Frequency Provider Last Rate Last Admin   clindamycin (CLEOCIN) IVPB 600 mg  600 mg Intravenous Once [place], MD        Review of Systems;  Patient denies headache, fevers, malaise, unintentional weight loss, skin rash, eye pain, sinus congestion and sinus pain,  sore throat, dysphagia,  hemoptysis , cough, dyspnea, wheezing, chest pain, palpitations, orthopnea, edema, abdominal pain, nausea, melena, diarrhea, constipation, flank pain, dysuria, hematuria, urinary  Frequency, nocturia, numbness, tingling, seizures,  Focal weakness, Loss of consciousness,  Tremor, insomnia, depression, anxiety, and suicidal ideation.      Objective:  BP 106/68 (BP Location: Left Arm, Patient Position: Sitting, Cuff Size: Normal)   Pulse 83   Temp (!) 95.6 F (35.3 C) (Temporal)   Resp 15   Ht 5' 5.5" (1.664 m)   Wt 137 lb 12.8 oz (62.5 kg)   SpO2 93%   BMI 22.58 kg/m   BP Readings from Last 3 Encounters:  01/07/21 106/68  01/01/21 (!) 118/58  10/30/20 (!) 124/54    Wt Readings from Last 3 Encounters:  01/07/21 137 lb 12.8 oz (62.5 kg)  01/01/21 138 lb 4 oz (62.7 kg)  10/25/20 150 lb 9.2 oz (68.3 kg)    General appearance: alert, cooperative and appears stated age Ears: normal TM's and external ear canals both ears Throat: lips, mucosa, and tongue normal; teeth and gums normal Neck: no adenopathy, no carotid bruit, supple, symmetrical, trachea midline and thyroid not enlarged, symmetric, no tenderness/mass/nodules Back: symmetric, no curvature. ROM normal. No CVA tenderness. Lungs: clear to auscultation bilaterally Heart: regular rate and rhythm, S1, S2 normal, no murmur, click, rub or gallop Abdomen: soft, non-tender; bowel sounds normal; no masses,  no organomegaly Pulses: 2+ and symmetric Skin: Skin color, texture, turgor normal. No rashes or lesions Lymph nodes: Cervical, supraclavicular, and axillary nodes normal.  Lab Results  Component Value Date   HGBA1C 6.3 12/13/2020   HGBA1C 6.9 (H) 09/26/2020   HGBA1C 6.8 (H) 05/14/2020    Lab Results  Component Value Date   CREATININE 0.91 12/13/2020   CREATININE 0.90 10/26/2020   CREATININE 0.89 09/26/2020    Lab Results  Component Value Date   WBC 6.8 12/13/2020   HGB 11.3 (L) 12/13/2020    HCT 34.2 (L) 12/13/2020   PLT 195.0 12/13/2020   GLUCOSE 126 (H) 12/13/2020   CHOL 191 05/14/2020   TRIG 56.0 05/14/2020   HDL 73.70 05/14/2020   LDLDIRECT 93.0 01/02/2016   LDLCALC 106 (H) 05/14/2020   ALT 19 12/13/2020   AST 20 12/13/2020   NA 140 12/13/2020   K 4.0 12/13/2020   CL 102 12/13/2020   CREATININE 0.91 12/13/2020   BUN 10 12/13/2020   CO2 26 12/13/2020   TSH 5.08 12/13/2020   INR 1.0 09/26/2020   HGBA1C 6.3 12/13/2020   MICROALBUR <0.7 03/11/2019    No results found.  Assessment & Plan:   Problem List Items Addressed This Visit       Unprioritized   Iatrogenic hypothyroidism    Thyroid function is borderline underactive  on current dose. Given her involuntary weight loss,  No current changes advised  Lab Results  Component Value Date   TSH 5.08 12/13/2020         Diabetes mellitus without complication ([place])    She remains  well-controlled on low GI diet alone and is overdue to urine testing for  Microalbuminuria despite two previous orders. . Patient is tolerating statin therapy for CAD risk reduction  .  Lab Results  Component Value Date   HGBA1C 6.3 12/13/2020   Lab Results  Component Value Date   MICROALBUR <0.7 03/11/2019         Relevant Orders   Microalbumin / creatinine urine ratio   Hyperlipidemia associated with type 2 diabetes mellitus ([place])    Managed with daily Zetia and QOD rosuvastatin per patient preference  Lab Results  Component Value Date   CHOL 191 05/14/2020   HDL 73.70 05/14/2020   LDLCALC 106 (H) 05/14/2020   LDLDIRECT 93.0 01/02/2016   TRIG 56.0 05/14/2020   CHOLHDL 3 05/14/2020   Lab Results  Component Value Date   ALT 19 12/13/2020   AST 20 12/13/2020   ALKPHOS 83 12/13/2020   BILITOT 0.5 12/13/2020         Chest pain    No recent occurrences.  No history of CAD.  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  She is tolerating zetia and crestor.        History of pernicious anemia    Recent B12 level was borderline  low and parenteral supplementation was started given her significant anemia post operatively .  IF ab pending   Lab Results  Component Value Date   VITAMINB12 396 12/13/2020   Lab Results  Component Value Date   WBC 6.8 12/13/2020   HGB 11.3 (L) 12/13/2020   HCT 34.2 (L) 12/13/2020   MCV 91.5 12/13/2020   PLT 195.0 12/13/2020         S/P TKR (total knee replacement) using cement, right    Exam of the knee today notes swelling and mild warmth without redness.        Neurotic excoriations    Off label use of gabapentin considered but decided against due to bilateral LE edema .  Will address after use of a daily dose of  diuretic is done as a trial        Other Visit Diagnoses     Acute blood loss as cause of postoperative anemia    -  Primary   Relevant Orders   CBC with Differential/Platelet   B12 deficiency       Relevant Orders   Intrinsic Factor Antibodies      A total of 40 minutes was spent with patient in reviewing peri and post operative labs,   glycemic control , counselling on her pruritic condition,  reviewing and explaining recent labs and imaging studies done, and coordination of care.  No orders of the defined types were placed in this encounter.   There are no discontinued medications.  Follow-up: Return in about 4 weeks (around 02/04/2021).   Crecencio Mc, MD

## 2021-01-07 NOTE — Patient Instructions (Addendum)
Your anemia from the surgery has nearly resolved.   Please return for labs once you have taken the furosemide and potassium daily for one week so we can check your kidney function.  We'll repeat the hemoglobin as well.   For the constipation,  any bulk forming laxative can be taken daily :  I also recommend  an increase in fiber intake to 25 to 35 grams daily. If  you cannot,  I recommend daily use of Miralax., metamucil, citrucel, benefiber  or fibercon.  Calcium and vitamin D are the most important  vitamins and minerals to take ,  a healthy well rounded diet takes care of the rest     Return in one month to discuss your depression screen

## 2021-01-08 ENCOUNTER — Telehealth: Payer: Self-pay | Admitting: Internal Medicine

## 2021-01-08 ENCOUNTER — Encounter: Payer: Self-pay | Admitting: Internal Medicine

## 2021-01-08 DIAGNOSIS — L981 Factitial dermatitis: Secondary | ICD-10-CM | POA: Insufficient documentation

## 2021-01-08 LAB — CBC WITH DIFFERENTIAL/PLATELET
Basophils Absolute: 0 10*3/uL (ref 0.0–0.1)
Basophils Relative: 0.6 % (ref 0.0–3.0)
Eosinophils Absolute: 0.1 10*3/uL (ref 0.0–0.7)
Eosinophils Relative: 1.8 % (ref 0.0–5.0)
HCT: 32.5 % — ABNORMAL LOW (ref 36.0–46.0)
Hemoglobin: 10.4 g/dL — ABNORMAL LOW (ref 12.0–15.0)
Lymphocytes Relative: 21.3 % (ref 12.0–46.0)
Lymphs Abs: 1.4 10*3/uL (ref 0.7–4.0)
MCHC: 31.9 g/dL (ref 30.0–36.0)
MCV: 93.2 fl (ref 78.0–100.0)
Monocytes Absolute: 0.5 10*3/uL (ref 0.1–1.0)
Monocytes Relative: 6.9 % (ref 3.0–12.0)
Neutro Abs: 4.6 10*3/uL (ref 1.4–7.7)
Neutrophils Relative %: 69.4 % (ref 43.0–77.0)
Platelets: 172 10*3/uL (ref 150.0–400.0)
RBC: 3.49 Mil/uL — ABNORMAL LOW (ref 3.87–5.11)
RDW: 18.1 % — ABNORMAL HIGH (ref 11.5–15.5)
WBC: 6.6 10*3/uL (ref 4.0–10.5)

## 2021-01-08 NOTE — Assessment & Plan Note (Signed)
No recent occurrences.  No history of CAD.  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 

## 2021-01-08 NOTE — Assessment & Plan Note (Addendum)
Managed with daily Zetia and QOD rosuvastatin per patient preference  Lab Results  Component Value Date   CHOL 191 05/14/2020   HDL 73.70 05/14/2020   LDLCALC 106 (H) 05/14/2020   LDLDIRECT 93.0 01/02/2016   TRIG 56.0 05/14/2020   CHOLHDL 3 05/14/2020   Lab Results  Component Value Date   ALT 19 12/13/2020   AST 20 12/13/2020   ALKPHOS 83 12/13/2020   BILITOT 0.5 12/13/2020

## 2021-01-08 NOTE — Assessment & Plan Note (Signed)
Continue  dual  therapy with ezitimibe and twice weekly crestor given documented evidence of moderate  atherosclerosis in the aorta on prior imaging studies .  She is tolerating zetia and crestor.

## 2021-01-08 NOTE — Assessment & Plan Note (Signed)
Off label use of gabapentin considered but decided against due to bilateral LE edema .  Will address after use of a daily dose of  diuretic is done as a trial

## 2021-01-08 NOTE — Assessment & Plan Note (Signed)
Recent B12 level was borderline  low and parenteral supplementation was started given her significant anemia post operatively .  IF ab pending   Lab Results  Component Value Date   VITAMINB12 396 12/13/2020   Lab Results  Component Value Date   WBC 6.8 12/13/2020   HGB 11.3 (L) 12/13/2020   HCT 34.2 (L) 12/13/2020   MCV 91.5 12/13/2020   PLT 195.0 12/13/2020

## 2021-01-08 NOTE — Telephone Encounter (Signed)
Confirm a lab appt for one weel:  needs BMET and urine protein

## 2021-01-08 NOTE — Assessment & Plan Note (Signed)
Thyroid function is borderline underactive  on current dose. Given her involuntary weight loss,  No current changes advised   Lab Results  Component Value Date   TSH 5.08 12/13/2020

## 2021-01-08 NOTE — Assessment & Plan Note (Signed)
Exam of the knee today notes swelling and mild warmth without redness.

## 2021-01-08 NOTE — Assessment & Plan Note (Signed)
She remains  well-controlled on low GI diet alone and is overdue to urine testing for  Microalbuminuria despite two previous orders. . Patient is tolerating statin therapy for CAD risk reduction  .  Lab Results  Component Value Date   HGBA1C 6.3 12/13/2020   Lab Results  Component Value Date   MICROALBUR <0.7 03/11/2019

## 2021-01-09 DIAGNOSIS — D649 Anemia, unspecified: Secondary | ICD-10-CM | POA: Insufficient documentation

## 2021-01-09 NOTE — Addendum Note (Signed)
Addended by: Crecencio Mc on: 01/09/2021 11:49 PM   Modules accepted: Orders

## 2021-01-09 NOTE — Assessment & Plan Note (Signed)
Drop in hemoglobin noted.  Checking iron studies  Lab Results  Component Value Date   WBC 6.6 01/07/2021   HGB 10.4 (L) 01/07/2021   HCT 32.5 (L) 01/07/2021   MCV 93.2 01/07/2021   PLT 172.0 01/07/2021     Lab Results  Component Value Date   VITAMINB12 396 12/13/2020   Lab Results  Component Value Date   IRON 53 12/13/2020   TIBC 303 12/13/2020   FERRITIN 344 (H) 12/13/2020

## 2021-01-15 ENCOUNTER — Ambulatory Visit (INDEPENDENT_AMBULATORY_CARE_PROVIDER_SITE_OTHER): Payer: Medicare Other

## 2021-01-15 ENCOUNTER — Telehealth: Payer: Self-pay

## 2021-01-15 ENCOUNTER — Other Ambulatory Visit: Payer: Self-pay

## 2021-01-15 DIAGNOSIS — E538 Deficiency of other specified B group vitamins: Secondary | ICD-10-CM | POA: Diagnosis not present

## 2021-01-15 MED ORDER — CYANOCOBALAMIN 1000 MCG/ML IJ SOLN
1000.0000 ug | Freq: Once | INTRAMUSCULAR | Status: AC
  Administered 2021-01-15: 1000 ug via INTRAMUSCULAR

## 2021-01-15 NOTE — Progress Notes (Signed)
Patient presented for B 12 injection to left deltoid, patient voiced no concerns nor showed any signs of distress during injection. 

## 2021-01-15 NOTE — Telephone Encounter (Signed)
Per PCP request. Dr Nicki Reaper would like to know if patient needs to still be on B12 injections due previous lab results and an uncompleted intrinsic factor lab. Please advise.

## 2021-01-15 NOTE — Telephone Encounter (Signed)
Dr Nicki Reaper is signing off on a B12 injection nurse visit, due to PCP not in office.

## 2021-01-15 NOTE — Telephone Encounter (Signed)
Patient has been scheduled for lab appointment tomorrow.

## 2021-01-16 ENCOUNTER — Other Ambulatory Visit (INDEPENDENT_AMBULATORY_CARE_PROVIDER_SITE_OTHER): Payer: Medicare Other

## 2021-01-16 ENCOUNTER — Other Ambulatory Visit: Payer: Self-pay

## 2021-01-16 DIAGNOSIS — L981 Factitial dermatitis: Secondary | ICD-10-CM

## 2021-01-16 DIAGNOSIS — E538 Deficiency of other specified B group vitamins: Secondary | ICD-10-CM

## 2021-01-16 DIAGNOSIS — D472 Monoclonal gammopathy: Secondary | ICD-10-CM

## 2021-01-16 DIAGNOSIS — E119 Type 2 diabetes mellitus without complications: Secondary | ICD-10-CM

## 2021-01-16 DIAGNOSIS — Z862 Personal history of diseases of the blood and blood-forming organs and certain disorders involving the immune mechanism: Secondary | ICD-10-CM

## 2021-01-16 DIAGNOSIS — D649 Anemia, unspecified: Secondary | ICD-10-CM

## 2021-01-16 LAB — BASIC METABOLIC PANEL
BUN: 14 mg/dL (ref 6–23)
CO2: 28 mEq/L (ref 19–32)
Calcium: 9.2 mg/dL (ref 8.4–10.5)
Chloride: 103 mEq/L (ref 96–112)
Creatinine, Ser: 0.86 mg/dL (ref 0.40–1.20)
GFR: 64.67 mL/min (ref >60.00)
Glucose, Bld: 138 mg/dL — ABNORMAL HIGH (ref 70–99)
Potassium: 4.3 mEq/L (ref 3.5–5.1)
Sodium: 140 mEq/L (ref 135–145)

## 2021-01-16 LAB — MICROALBUMIN / CREATININE URINE RATIO
Creatinine,U: 91.5 mg/dL
Microalb Creat Ratio: 0.8 mg/g (ref 0.0–30.0)
Microalb, Ur: <0.7 mg/dL (ref 0.0–1.9)

## 2021-01-17 DIAGNOSIS — M25561 Pain in right knee: Secondary | ICD-10-CM | POA: Diagnosis not present

## 2021-01-17 DIAGNOSIS — M25661 Stiffness of right knee, not elsewhere classified: Secondary | ICD-10-CM | POA: Diagnosis not present

## 2021-01-18 DIAGNOSIS — Z96651 Presence of right artificial knee joint: Secondary | ICD-10-CM | POA: Diagnosis not present

## 2021-01-20 LAB — PROTEIN ELECTROPHORESIS, SERUM
Abnormal Protein Band1: 1 g/dL — ABNORMAL HIGH
Albumin ELP: 3.7 g/dL — ABNORMAL LOW (ref 3.8–4.8)
Alpha 1: 0.5 g/dL — ABNORMAL HIGH (ref 0.2–0.3)
Alpha 2: 0.8 g/dL (ref 0.5–0.9)
Beta 2: 1 g/dL — ABNORMAL HIGH (ref 0.2–0.5)
Beta Globulin: 0.5 g/dL (ref 0.4–0.6)
Gamma Globulin: 0.7 g/dL — ABNORMAL LOW (ref 0.8–1.7)
Total Protein: 7.2 g/dL (ref 6.1–8.1)

## 2021-01-20 LAB — INTRINSIC FACTOR ANTIBODIES: Intrinsic Factor: POSITIVE — AB

## 2021-01-21 LAB — IFE AND PE, RANDOM URINE
% BETA, Urine: 37.1 %
ALBUMIN, U: 18.8 %
ALPHA 1 URINE: 9.1 %
ALPHA-2-GLOBULIN, U: 17.8 %
GAMMA GLOBULIN URINE: 17.2 %
Protein, Ur: 11.2 mg/dL

## 2021-01-22 DIAGNOSIS — D472 Monoclonal gammopathy: Secondary | ICD-10-CM | POA: Insufficient documentation

## 2021-01-22 NOTE — Addendum Note (Signed)
Addended by: Crecencio Mc on: 01/22/2021 09:24 AM   Modules accepted: Orders

## 2021-01-22 NOTE — Assessment & Plan Note (Signed)
Referral to Dr Janese Banks

## 2021-01-22 NOTE — Assessment & Plan Note (Signed)
Her intrinisic factor antibody was positive,

## 2021-01-25 ENCOUNTER — Other Ambulatory Visit: Payer: Self-pay | Admitting: Internal Medicine

## 2021-01-25 DIAGNOSIS — M25561 Pain in right knee: Secondary | ICD-10-CM | POA: Diagnosis not present

## 2021-01-25 DIAGNOSIS — M25661 Stiffness of right knee, not elsewhere classified: Secondary | ICD-10-CM | POA: Diagnosis not present

## 2021-01-25 NOTE — Telephone Encounter (Signed)
RX Refill:xanax Last Seen:01-07-21 Last ordered:11-07-20

## 2021-01-29 ENCOUNTER — Encounter: Payer: Self-pay | Admitting: Oncology

## 2021-01-29 ENCOUNTER — Inpatient Hospital Stay: Payer: Medicare Other | Attending: Oncology | Admitting: Oncology

## 2021-01-29 ENCOUNTER — Inpatient Hospital Stay: Payer: Medicare Other

## 2021-01-29 VITALS — BP 117/65 | HR 69 | Temp 98.3°F | Resp 18 | Wt 136.0 lb

## 2021-01-29 DIAGNOSIS — E785 Hyperlipidemia, unspecified: Secondary | ICD-10-CM | POA: Insufficient documentation

## 2021-01-29 DIAGNOSIS — M25661 Stiffness of right knee, not elsewhere classified: Secondary | ICD-10-CM | POA: Diagnosis not present

## 2021-01-29 DIAGNOSIS — E039 Hypothyroidism, unspecified: Secondary | ICD-10-CM | POA: Diagnosis not present

## 2021-01-29 DIAGNOSIS — E119 Type 2 diabetes mellitus without complications: Secondary | ICD-10-CM | POA: Diagnosis not present

## 2021-01-29 DIAGNOSIS — M25561 Pain in right knee: Secondary | ICD-10-CM | POA: Diagnosis not present

## 2021-01-29 DIAGNOSIS — D472 Monoclonal gammopathy: Secondary | ICD-10-CM | POA: Insufficient documentation

## 2021-01-29 DIAGNOSIS — D649 Anemia, unspecified: Secondary | ICD-10-CM | POA: Insufficient documentation

## 2021-01-29 LAB — CBC WITH DIFFERENTIAL/PLATELET
Abs Immature Granulocytes: 0.03 10*3/uL (ref 0.00–0.07)
Basophils Absolute: 0 10*3/uL (ref 0.0–0.1)
Basophils Relative: 0 %
Eosinophils Absolute: 0.1 10*3/uL (ref 0.0–0.5)
Eosinophils Relative: 1 %
HCT: 34.6 % — ABNORMAL LOW (ref 36.0–46.0)
Hemoglobin: 11.2 g/dL — ABNORMAL LOW (ref 12.0–15.0)
Immature Granulocytes: 0 %
Lymphocytes Relative: 20 %
Lymphs Abs: 1.6 10*3/uL (ref 0.7–4.0)
MCH: 29.7 pg (ref 26.0–34.0)
MCHC: 32.4 g/dL (ref 30.0–36.0)
MCV: 91.8 fL (ref 80.0–100.0)
Monocytes Absolute: 0.5 10*3/uL (ref 0.1–1.0)
Monocytes Relative: 6 %
Neutro Abs: 5.8 10*3/uL (ref 1.7–7.7)
Neutrophils Relative %: 73 %
Platelets: 195 10*3/uL (ref 150–400)
RBC: 3.77 MIL/uL — ABNORMAL LOW (ref 3.87–5.11)
RDW: 17 % — ABNORMAL HIGH (ref 11.5–15.5)
WBC: 8 10*3/uL (ref 4.0–10.5)
nRBC: 0 % (ref 0.0–0.2)

## 2021-01-29 LAB — VITAMIN B12: Vitamin B-12: 475 pg/mL (ref 180–914)

## 2021-01-29 LAB — LACTATE DEHYDROGENASE: LDH: 135 U/L (ref 98–192)

## 2021-01-29 NOTE — Progress Notes (Signed)
Patient here for oncology follow-up appointment, concerns of extreme fatigue

## 2021-01-30 LAB — KAPPA/LAMBDA LIGHT CHAINS
Kappa free light chain: 25 mg/L — ABNORMAL HIGH (ref 3.3–19.4)
Kappa, lambda light chain ratio: 1.51 (ref 0.26–1.65)
Lambda free light chains: 16.6 mg/L (ref 5.7–26.3)

## 2021-01-30 LAB — HAPTOGLOBIN: Haptoglobin: 146 mg/dL (ref 42–346)

## 2021-01-31 NOTE — Progress Notes (Signed)
I connected with Megan Bradley on 01/31/21 at  1:30 PM EDT by video enabled telemedicine visit and verified that I am speaking with the correct person using two identifiers.   I discussed the limitations, risks, security and privacy concerns of performing an evaluation and management service by telemedicine and the availability of in-person appointments. I also discussed with the patient that there may be a patient responsible charge related to this service. The patient expressed understanding and agreed to proceed.  Other persons participating in the visit and their role in the encounter:  none  Patient's location:  cancer center Provider's location:  home  Reason for referreal: biclonal gammopathy, pernicious anemia  History of present illness: Patient is a 80 year old female with past medical history significant for type 2 diabetes hyperlipidemia hypothyroidism who has been referred for biclonal gammopathy and concern for pernicious anemia.  Her recent CBC from 01/07/2021 showed white count of 6.6, H&H of 10.4/32.5 with a platelet count of 172.  B12 level was normal at 396 in July 2022.  Iron studies in July showed an elevated ferritin of 344 with an iron saturation of 17%.  Urine protein electrophoresis showed Bence-Jones protein positive IgG kappa serum protein electrophoresis showed 2 possible abnormal protein bands that may represent monoclonal immunoglobulin light chains.  Intrinsic factor antibody was negative a year ago and positive in August 2022.  Looking back at her prior labs patient's hemoglobin was around 12 until December 2021.  It was 11.4 in April 2022 and then went down to 8.9 in May 2022 when she was admitted for right total knee replacement.  Following that her hemoglobin went up to 11.3 in July 2022  Patient currently reports some ongoing fatigue but denies other complaints at this time     Review of Systems  Constitutional:  Positive for malaise/fatigue. Negative for chills,  fever and weight loss.  HENT:  Negative for congestion, ear discharge and nosebleeds.   Eyes:  Negative for blurred vision.  Respiratory:  Negative for cough, hemoptysis, sputum production, shortness of breath and wheezing.   Cardiovascular:  Negative for chest pain, palpitations, orthopnea and claudication.  Gastrointestinal:  Negative for abdominal pain, blood in stool, constipation, diarrhea, heartburn, melena, nausea and vomiting.  Genitourinary:  Negative for dysuria, flank pain, frequency, hematuria and urgency.  Musculoskeletal:  Negative for back pain, joint pain and myalgias.  Skin:  Negative for rash.  Neurological:  Negative for dizziness, tingling, focal weakness, seizures, weakness and headaches.  Endo/Heme/Allergies:  Does not bruise/bleed easily.  Psychiatric/Behavioral:  Negative for depression and suicidal ideas. The patient does not have insomnia.    Allergies  Allergen Reactions   Atorvastatin Other (See Comments)    Muscle cramps and weakness   Codeine Nausea And Vomiting   Fentanyl Nausea And Vomiting   Lipitor [Atorvastatin Calcium]     Muscle cramps and weakness   Tetracyclines & Related Hives    Past Medical History:  Diagnosis Date   Anxiety    Anxiety disorder    Aortic atherosclerosis (HCC)    Arthritis    both knees   Breast disorder in female 10/14/2019   Cervical dysplasia    a. 2010 - high grade squamous intraepithelial lesion s/p excision.   Chest pain    a. 2006 or 2007 Cath [facility]): reportedly nl cors;  b. 09/2011 ETT: nl.   DOE (dyspnea on exertion)    Family history of adverse reaction to anesthesia    grand daughter was hard to wake  up after back surgery   GERD (gastroesophageal reflux disease)    Grade II diastolic dysfunction    Herpes zoster    Hypothyroidism    Mild aortic regurgitation    Nutcracker esophagus    Orthostatic hypotension    Pneumonia    Pre-diabetes    Ruptured left breast implant, initial encounter 07/05/2018    Sleep apnea    does not use cpap, did not tolerate   Vertigo    Wears dentures    partial lower    Past Surgical History:  Procedure Laterality Date   AUGMENTATION MAMMAPLASTY Bilateral    BREAST EXCISIONAL BIOPSY Left    CARDIAC CATHETERIZATION  2003   Dr.Callwood, R/L heart cath,   COLONOSCOPY WITH PROPOFOL N/A 06/12/2017   Procedure: COLONOSCOPY WITH PROPOFOL;  Surgeon: Lucilla Lame, MD;  Location: [facility];  Service: Endoscopy;  Laterality: N/A;   dental work     ESOPHAGEAL MANOMETRY N/A 06/19/2016   Procedure: ESOPHAGEAL MANOMETRY (EM);  Surgeon: Lucilla Lame, MD;  Location: [facility];  Service: Endoscopy;  Laterality: N/A;   ESOPHAGOGASTRODUODENOSCOPY (EGD) WITH PROPOFOL N/A 04/03/2017   Procedure: ESOPHAGOGASTRODUODENOSCOPY (EGD) WITH PROPOFOL;  Surgeon: Lucilla Lame, MD;  Location: [facility];  Service: Endoscopy;  Laterality: N/A;  diabetic - diet controlled   THYROIDECTOMY  05/15/15   [facility]   TONSILLECTOMY     TOTAL KNEE ARTHROPLASTY Right 10/25/2020   Procedure: RIGHT TOTAL KNEE ARTHROPLASTY;  Surgeon: Thornton Park, MD;  Location: [facility];  Service: Orthopedics;  Laterality: Right;    Social History   Socioeconomic History   Marital status: Married    Spouse name: [name]   Number of children: Not on file   Years of education: Not on file   Highest education level: Not on file  Occupational History   Not on file  Tobacco Use   Smoking status: Never   Smokeless tobacco: Never  Vaping Use   Vaping Use: Never used  Substance and Sexual Activity   Alcohol use: Yes    Comment: wine once a week   Drug use: No   Sexual activity: Never  Other Topics Concern   Not on file  Social History Narrative   Lives in [place].  Active but doesn't exercise. Retired from [employer].   Social Determinants of Health   Financial Resource Strain: Not on file  Food Insecurity: Not on file  Transportation Needs: Not on file  Physical Activity: Not on file   Stress: Not on file  Social Connections: Not on file  Intimate Partner Violence: Not on file    Family History  Problem Relation Age of Onset   Heart attack Mother    Alcohol abuse Mother    Mental illness Mother        died in her 59's of alzheimer's Dementia   Heart disease Mother 61   Heart attack Father    Heart disease Father 51       AMI - died @ 33.   Colon cancer Paternal Grandmother 45     Current Outpatient Medications:    ALPRAZolam (XANAX) 0.5 MG tablet, Take 1 tablet by mouth twice daily as needed for anxiety, Disp: 60 tablet, Rfl: 5   diltiazem (CARDIZEM CD) 120 MG 24 hr capsule, Take 120 mg by mouth in the morning., Disp: , Rfl:    ezetimibe (ZETIA) 10 MG tablet, TAKE 1 TABLET BY MOUTH  DAILY, Disp: 90 tablet, Rfl: 3   furosemide (LASIX) 20 MG tablet,  Take 1 tablet (20 mg total) by mouth daily as needed (swelling/fluid retention)., Disp: 90 tablet, Rfl: 0   hyoscyamine (LEVSIN SL) 0.125 MG SL tablet, DISSOLVE 1 TABLET UNDER THE TONGUE EVERY 4 HOURS AS NEEDED, Disp: 30 tablet, Rfl: 0   levothyroxine (SYNTHROID) 112 MCG tablet, Take 112 mcg by mouth in the morning., Disp: , Rfl:    meclizine (ANTIVERT) 25 MG tablet, TAKE 1 TABLET BY MOUTH THREE TIMES DAILY AS NEEDED FOR VERTIGO, Disp: 30 tablet, Rfl: 0   omeprazole (PRILOSEC) 40 MG capsule, Take 40 mg by mouth in the morning and at bedtime., Disp: , Rfl:    potassium chloride (KLOR-CON) 10 MEQ tablet, Take 1 tablet (10 mEq total) by mouth daily as needed (with lasix)., Disp: 30 tablet, Rfl: 3   rosuvastatin (CRESTOR) 20 MG tablet, Take 1 tablet by mouth once daily (Patient taking differently: Take 20 mg by mouth every other day. In the morning), Disp: 90 tablet, Rfl: 0   sertraline (ZOLOFT) 100 MG tablet, Take 200 mg by mouth in the morning., Disp: , Rfl:  No current facility-administered medications for this visit.  Facility-Administered Medications Ordered in Other Visits:    clindamycin (CLEOCIN) IVPB 600 mg, 600  mg, Intravenous, Once, Thornton Park, MD  No results found.  No images are attached to the encounter.   CMP Latest Ref Rng & Units 01/16/2021  Glucose 70 - 99 mg/dL 138(H)  BUN 6 - 23 mg/dL 14  Creatinine 0.40 - 1.20 mg/dL 0.86  Sodium 135 - 145 mEq/L 140  Potassium 3.5 - 5.1 mEq/L 4.3  Chloride 96 - 112 mEq/L 103  CO2 19 - 32 mEq/L 28  Calcium 8.4 - 10.5 mg/dL 9.2  Total Protein 6.1 - 8.1 g/dL 7.2  Total Bilirubin 0.2 - 1.2 mg/dL -  Alkaline Phos 39 - 117 U/L -  AST 0 - 37 U/L -  ALT 0 - 35 U/L -   CBC Latest Ref Rng & Units 01/29/2021  WBC 4.0 - 10.5 K/uL 8.0  Hemoglobin 12.0 - 15.0 g/dL 11.2(L)  Hematocrit 36.0 - 46.0 % 34.6(L)  Platelets 150 - 400 K/uL 195     Observation/objective: Appears in no acute distress over video visit today.  Breathing is nonlabored  Assessment and plan: Patient is a 80 year old female referred for following issues:  Biclonal gammopathy: Patient noted to have a small amount of biclonal gammopathy on SPEP and IgG kappa monoclonal protein random urine protein electrophoresis.  No evidence of AKI, hypercalcemia or elevated total protein.  Her hemoglobin at baseline is around 12 and it dipped down to the eights after her knee replacement and subsequently did pick up.  About 3 weeks ago her hemoglobin was 10.4.  Based on her labs there was no evidence of iron deficiency or B12 deficiency.  TSH normal.  I suspect patient has a component of MGUS but is not contributing to her anemia.  Today I will be checking a CBC serum free light chains, myeloma panel, haptoglobin LDH and a repeat B12 level 2.  Intrinsic factor positive: Clinical significance of this is unclear since patient has a normal B12 level and absence of macrocytosis.  B12 level will be repeated today.  I will see her back in 2 weeks in person to discuss the results of blood work and complete her in person exam since I could not do it in person today as I tested positive for COVID  Follow-up  instructions:as above  I discussed the assessment and  treatment plan with the patient. The patient was provided an opportunity to ask questions and all were answered. The patient agreed with the plan and demonstrated an understanding of the instructions.   The patient was advised to call back or seek an in-person evaluation if the symptoms worsen or if the condition fails to improve as anticipated.    Visit Diagnosis: 1. Normocytic anemia     Dr. Randa Evens, MD, MPH [facility] Tel- [phone] 01/31/2021 7:15 AM

## 2021-02-04 LAB — MULTIPLE MYELOMA PANEL, SERUM
Albumin SerPl Elph-Mcnc: 3.6 g/dL (ref 2.9–4.4)
Albumin/Glob SerPl: 1.1 (ref 0.7–1.7)
Alpha 1: 0.3 g/dL (ref 0.0–0.4)
Alpha2 Glob SerPl Elph-Mcnc: 0.8 g/dL (ref 0.4–1.0)
B-Globulin SerPl Elph-Mcnc: 1.8 g/dL — ABNORMAL HIGH (ref 0.7–1.3)
Gamma Glob SerPl Elph-Mcnc: 0.6 g/dL (ref 0.4–1.8)
Globulin, Total: 3.4 g/dL (ref 2.2–3.9)
IgA: 230 mg/dL (ref 64–422)
IgG (Immunoglobin G), Serum: 1325 mg/dL (ref 586–1602)
IgM (Immunoglobulin M), Srm: 60 mg/dL (ref 26–217)
M Protein SerPl Elph-Mcnc: 0.9 g/dL — ABNORMAL HIGH
Total Protein ELP: 7 g/dL (ref 6.0–8.5)

## 2021-02-05 DIAGNOSIS — Z96651 Presence of right artificial knee joint: Secondary | ICD-10-CM | POA: Diagnosis not present

## 2021-02-10 ENCOUNTER — Telehealth: Payer: Self-pay | Admitting: Internal Medicine

## 2021-02-12 ENCOUNTER — Ambulatory Visit: Payer: Medicare Other | Admitting: Plastic Surgery

## 2021-02-12 NOTE — Telephone Encounter (Signed)
Medication refilled 01/25/2021

## 2021-02-14 ENCOUNTER — Other Ambulatory Visit: Payer: Self-pay | Admitting: Internal Medicine

## 2021-02-20 ENCOUNTER — Other Ambulatory Visit: Payer: Self-pay | Admitting: Family

## 2021-02-25 ENCOUNTER — Ambulatory Visit (INDEPENDENT_AMBULATORY_CARE_PROVIDER_SITE_OTHER): Payer: Medicare Other | Admitting: Internal Medicine

## 2021-02-25 ENCOUNTER — Other Ambulatory Visit: Payer: Self-pay

## 2021-02-25 ENCOUNTER — Encounter: Payer: Self-pay | Admitting: Internal Medicine

## 2021-02-25 VITALS — BP 124/66 | HR 76 | Temp 96.0°F | Ht 65.5 in | Wt 138.6 lb

## 2021-02-25 DIAGNOSIS — J069 Acute upper respiratory infection, unspecified: Secondary | ICD-10-CM

## 2021-02-25 DIAGNOSIS — R059 Cough, unspecified: Secondary | ICD-10-CM | POA: Diagnosis not present

## 2021-02-25 DIAGNOSIS — L03115 Cellulitis of right lower limb: Secondary | ICD-10-CM | POA: Diagnosis not present

## 2021-02-25 DIAGNOSIS — E538 Deficiency of other specified B group vitamins: Secondary | ICD-10-CM

## 2021-02-25 MED ORDER — CYANOCOBALAMIN 1000 MCG/ML IJ SOLN
1000.0000 ug | Freq: Once | INTRAMUSCULAR | Status: AC
  Administered 2021-02-25: 1000 ug via INTRAMUSCULAR

## 2021-02-25 NOTE — Progress Notes (Signed)
Subjective:  Patient ID: Megan Bradley, female    DOB: [birth year]  Age: 80 y.o. MRN: [id number]  CC: The primary encounter diagnosis was B12 deficiency. Diagnoses of Cough, Cellulitis of right anterior lower leg, and Viral upper respiratory tract infection were also pertinent to this visit.  HPI Megan Bradley presents for treatment of URI  Chief Complaint  Patient presents with   Follow-up    Follow up on depression   Spent two weeks in [place]. Returned home 2 days .  Co travellers  tested positive upon return  home after being symptomatic on the trip.  Patient started having symptoms on Sunday  .  Symptoms hve been mild,  no fever,  mild rhinitis,  scratchy throat , no body aches.   Last COVID INFECTION was in May   2) follow up on recent Knee replacement .  Right leg now having more swelling .  She was given furosemide to use daily. ECHO reviewed.. EF 65%   3) recurrent cellulitis  of both lower extremities aggravated by neurotic picking and itching ,  being treated by [facility].  Can't stop touching her legs with her hands/     Outpatient Medications Prior to Visit  Medication Sig Dispense Refill   ALPRAZolam (XANAX) 0.5 MG tablet Take 1 tablet by mouth twice daily as needed for anxiety 60 tablet 5   diltiazem (CARDIZEM CD) 120 MG 24 hr capsule Take 120 mg by mouth in the morning.     ezetimibe (ZETIA) 10 MG tablet TAKE 1 TABLET BY MOUTH  DAILY 90 tablet 3   furosemide (LASIX) 20 MG tablet TAKE 1 TABLET BY MOUTH  DAILY AS NEEDED FOR  SWELLING/FLUID RETENTION 90 tablet 3   hyoscyamine (LEVSIN SL) 0.125 MG SL tablet DISSOLVE 1 TABLET UNDER THE TONGUE EVERY 4 HOURS AS NEEDED 30 tablet 0   levothyroxine (SYNTHROID) 112 MCG tablet TAKE 1 TABLET BY MOUTH  DAILY BEFORE BREAKFAST 90 tablet 3   meclizine (ANTIVERT) 25 MG tablet TAKE 1 TABLET BY MOUTH THREE TIMES DAILY AS NEEDED FOR VERTIGO 30 tablet 0   omeprazole (PRILOSEC) 40 MG capsule Take 40 mg by mouth in the morning and at bedtime.      potassium chloride (KLOR-CON) 10 MEQ tablet Take 1 tablet (10 mEq total) by mouth daily as needed (with lasix). 30 tablet 3   rosuvastatin (CRESTOR) 20 MG tablet Take 1 tablet by mouth once daily (Patient taking differently: Take 20 mg by mouth every other day. In the morning) 90 tablet 0   sertraline (ZOLOFT) 100 MG tablet Take 200 mg by mouth in the morning.     Facility-Administered Medications Prior to Visit  Medication Dose Route Frequency Provider Last Rate Last Admin   clindamycin (CLEOCIN) IVPB 600 mg  600 mg Intravenous Once Thornton Park, MD        Review of Systems;  Patient denies headache, fevers, malaise, unintentional weight loss, skin rash, eye pain, sinus congestion and sinus pain, sore throat, dysphagia,  hemoptysis , , dyspnea, wheezing, chest pain, palpitations, orthopnea, edema, abdominal pain, nausea, melena, diarrhea, constipation, flank pain, dysuria, hematuria, urinary  Frequency, nocturia, numbness, tingling, seizures,  Focal weakness, Loss of consciousness,  Tremor, insomnia, depression, anxiety, and suicidal ideation.      Objective:  BP 124/66 (BP Location: Left Arm, Patient Position: Sitting, Cuff Size: Normal)   Pulse 76   Temp (!) 96 F (35.6 C) (Temporal)   Ht 5' 5.5" (1.664 m)   [facility]  138 lb 9.6 oz (62.9 kg)   SpO2 97%   BMI 22.71 kg/m   BP Readings from Last 3 Encounters:  02/25/21 124/66  01/29/21 117/65  01/07/21 106/68    Wt Readings from Last 3 Encounters:  02/25/21 138 lb 9.6 oz (62.9 kg)  01/29/21 136 lb (61.7 kg)  01/07/21 137 lb 12.8 oz (62.5 kg)    General appearance: alert, cooperative and appears stated age Ears: normal TM's and external ear canals both ears Throat: lips, mucosa, and tongue normal; teeth and gums normal Neck: no adenopathy, no carotid bruit, supple, symmetrical, trachea midline and thyroid not enlarged, symmetric, no tenderness/mass/nodules Back: symmetric, no curvature. ROM normal. No CVA tenderness. Lungs:  clear to auscultation bilaterally Heart: regular rate and rhythm, S1, S2 normal, no murmur, click, rub or gallop Abdomen: soft, non-tender; bowel sounds normal; no masses,  no organomegaly Pulses: 2+ and symmetric Skin: lower extremities with erythematous papules,  some crusted, some bleeding, non pitting edema  Lymph nodes: Cervical, supraclavicular, and axillary nodes normal.  Lab Results  Component Value Date   HGBA1C 6.3 12/13/2020   HGBA1C 6.9 (H) 09/26/2020   HGBA1C 6.8 (H) 05/14/2020    Lab Results  Component Value Date   CREATININE 0.86 01/16/2021   CREATININE 0.91 12/13/2020   CREATININE 0.90 10/26/2020    Lab Results  Component Value Date   WBC 8.0 01/29/2021   HGB 11.2 (L) 01/29/2021   HCT 34.6 (L) 01/29/2021   PLT 195 01/29/2021   GLUCOSE 138 (H) 01/16/2021   CHOL 191 05/14/2020   TRIG 56.0 05/14/2020   HDL 73.70 05/14/2020   LDLDIRECT 93.0 01/02/2016   LDLCALC 106 (H) 05/14/2020   ALT 19 12/13/2020   AST 20 12/13/2020   NA 140 01/16/2021   K 4.3 01/16/2021   CL 103 01/16/2021   CREATININE 0.86 01/16/2021   BUN 14 01/16/2021   CO2 28 01/16/2021   TSH 5.08 12/13/2020   INR 1.0 09/26/2020   HGBA1C 6.3 12/13/2020   MICROALBUR <0.7 01/16/2021    No results found.  Assessment & Plan:   Problem List Items Addressed This Visit       Unprioritized   Cellulitis of right anterior lower leg    Secondary to neurotic itching and excoriation Off label use of gabapentin considered but decided against due to bilateral LE edema .  Septra  DS x 7 days,  Diuretic daily x 3, Then Continue diuretic every other day,  Add antihistamine twice daily to manage the itching.          URI (upper respiratory infection)    In the setting of  prolonged COVID exposure . POC COVID test was negative;  PCR is pending .  Advised to quarantine until results are  Known       Other Visit Diagnoses     B12 deficiency    -  Primary   Relevant Medications   cyanocobalamin  ((VITAMIN B-12)) injection 1,000 mcg (Completed)   Cough       Relevant Orders   POC COVID-19 (Completed)   COVID-19, Flu A+B and RSV       I provided  30 minutes of  face-to-face time during this encounter reviewing patient's current problems and past surgeries, labs and imaging studies, providing counseling on the above mentioned problems , and coordination  of care .   Meds ordered this encounter  Medications   cyanocobalamin ((VITAMIN B-12)) injection 1,000 mcg    There are no discontinued  medications.  Follow-up: No follow-ups on file.   Crecencio Mc, MD

## 2021-02-25 NOTE — Patient Instructions (Addendum)
Start the bactrim and a probiotic   Use the fluid pill for 3 days , then  not more than every other day. (And potassium)   Use calamine lotion for itching  Wear gloves  or mittens   Use dial soap for the legs   PLEASE start allegra or zyrtec twice daily as a trial for itching for 2 weeks

## 2021-02-25 NOTE — Progress Notes (Signed)
po

## 2021-02-26 DIAGNOSIS — J069 Acute upper respiratory infection, unspecified: Secondary | ICD-10-CM | POA: Insufficient documentation

## 2021-02-26 LAB — POC COVID19 BINAXNOW: SARS Coronavirus 2 Ag: NEGATIVE

## 2021-02-26 NOTE — Assessment & Plan Note (Signed)
Secondary to neurotic itching and excoriation Off label use of gabapentin considered but decided against due to bilateral LE edema .  Septra  DS x 7 days,  Diuretic daily x 3, Then Continue diuretic every other day,  Add antihistamine twice daily to manage the itching.

## 2021-02-26 NOTE — Assessment & Plan Note (Addendum)
In the setting of  prolonged COVID exposure . POC COVID test was negative;  PCR is pending .  Advised to quarantine until results are  Known

## 2021-02-27 LAB — COVID-19, FLU A+B AND RSV
Influenza A, NAA: NOT DETECTED
Influenza B, NAA: NOT DETECTED
RSV, NAA: NOT DETECTED
SARS-CoV-2, NAA: NOT DETECTED

## 2021-02-28 ENCOUNTER — Telehealth: Payer: Self-pay | Admitting: Internal Medicine

## 2021-02-28 NOTE — Telephone Encounter (Signed)
Spoke with pt and she stated that this is not urgent. She would like to wait until Dr. Derrel Nip is in the office and see what she says.

## 2021-02-28 NOTE — Telephone Encounter (Signed)
Reviewed.  If concern regarding lump in breast, can schedule appt to evaluate and then can schedule mammogram needed.  Please forward to Dr Derrel Nip as well.

## 2021-02-28 NOTE — Telephone Encounter (Signed)
Patient calling in and states she has felt a pea sized lump on the right breast. No pain, no discoloration, no visual changes. Looking at the breast as a clock the lump is at 11 o clock.   Patient has a history of breast implants 40 years ago. 2 years ago they ruptured, were taken out and replaced. States she has not noticed a lump like this before.   Patient would like a diagnostic mammogram ordered for [place].

## 2021-03-01 ENCOUNTER — Other Ambulatory Visit: Payer: Self-pay

## 2021-03-01 ENCOUNTER — Encounter: Payer: Self-pay | Admitting: Oncology

## 2021-03-01 ENCOUNTER — Inpatient Hospital Stay: Payer: Medicare Other | Attending: Oncology | Admitting: Oncology

## 2021-03-01 VITALS — BP 124/66 | HR 69 | Temp 97.6°F | Resp 16 | Wt 137.4 lb

## 2021-03-01 DIAGNOSIS — Z79899 Other long term (current) drug therapy: Secondary | ICD-10-CM | POA: Insufficient documentation

## 2021-03-01 DIAGNOSIS — D649 Anemia, unspecified: Secondary | ICD-10-CM | POA: Diagnosis not present

## 2021-03-01 DIAGNOSIS — D472 Monoclonal gammopathy: Secondary | ICD-10-CM | POA: Diagnosis not present

## 2021-03-01 DIAGNOSIS — E119 Type 2 diabetes mellitus without complications: Secondary | ICD-10-CM | POA: Diagnosis not present

## 2021-03-01 DIAGNOSIS — Z9882 Breast implant status: Secondary | ICD-10-CM | POA: Diagnosis not present

## 2021-03-01 DIAGNOSIS — E039 Hypothyroidism, unspecified: Secondary | ICD-10-CM | POA: Diagnosis not present

## 2021-03-04 ENCOUNTER — Other Ambulatory Visit: Payer: Self-pay | Admitting: Internal Medicine

## 2021-03-04 ENCOUNTER — Encounter: Payer: Self-pay | Admitting: Internal Medicine

## 2021-03-04 ENCOUNTER — Other Ambulatory Visit: Payer: Self-pay

## 2021-03-04 ENCOUNTER — Ambulatory Visit (INDEPENDENT_AMBULATORY_CARE_PROVIDER_SITE_OTHER): Payer: Medicare Other | Admitting: Internal Medicine

## 2021-03-04 VITALS — BP 130/74 | HR 80 | Temp 95.8°F | Ht 65.51 in | Wt 136.2 lb

## 2021-03-04 DIAGNOSIS — L03115 Cellulitis of right lower limb: Secondary | ICD-10-CM | POA: Diagnosis not present

## 2021-03-04 DIAGNOSIS — R2689 Other abnormalities of gait and mobility: Secondary | ICD-10-CM | POA: Diagnosis not present

## 2021-03-04 DIAGNOSIS — N63 Unspecified lump in unspecified breast: Secondary | ICD-10-CM

## 2021-03-04 DIAGNOSIS — D472 Monoclonal gammopathy: Secondary | ICD-10-CM | POA: Diagnosis not present

## 2021-03-04 DIAGNOSIS — J069 Acute upper respiratory infection, unspecified: Secondary | ICD-10-CM

## 2021-03-04 DIAGNOSIS — Z23 Encounter for immunization: Secondary | ICD-10-CM | POA: Diagnosis not present

## 2021-03-04 DIAGNOSIS — K224 Dyskinesia of esophagus: Secondary | ICD-10-CM

## 2021-03-04 NOTE — Progress Notes (Signed)
Subjective:  Patient ID: Megan Bradley, female    DOB: 30-Sep-1940  Age: 80 y.o. MRN: [id number]  CC: The primary encounter diagnosis was Breast mass in female. Diagnoses of Need for influenza vaccination, Upper respiratory tract infection, unspecified type, Cellulitis of right anterior lower leg, MGUS (monoclonal gammopathy of unknown significance), Balance problem, and Nutcracker esophagus were also pertinent to this visit.  HPI Megan Bradley presents for abnormal breast exam and follow up on multiple other issues Chief Complaint  Patient presents with   Breast Problem    Pt has nodule on right breast. Pt noticed it on the 22nd. Pt has no pain , no increased swelling or itching.   This visit occurred during the SARS-CoV-2 public health emergency.  Safety protocols were in place, including screening questions prior to the visit, additional usage of staff PPE, and extensive cleaning of exam room while observing appropriate contact time as indicated for disinfecting solutions.   Megan Bradley is a 80 yr old female with a history of bilateral breast augmentation with silicone implants who underwent surgical removal/recent replacement of bilateral prepectoral ruptured silicone implants with and capsulectomies  in March 2020 by Megan Hives, MD . In December of 2020 at follow up with Megan Bradley she was noted to have developed firmness and capsule formation.  She was offered options for capsule release but deferred.   3d Screening  Mammogram with implants was done in March 2021 followed by an in office evaluation by Megan Bradley in  May 2021 for bilateral nodules which were felt to be caused the by the implant poking through the capsules  (based on Megan Bradley's exam and note from May 2021 ) .  She was offered an MRI at that time which was deferred . Follow up in one year was planned (May 2022)   She underwent a right total knee replacement in April 2022 and was referred to Hematology in August for a  normocytic anemia with an abnormal SPEP and was diagnosed with MGUS . During follow up with Megan Bradley she mentioned the right breast nodule and was referred back to me  for management. She has not seen her plastic surgeon since May 2021 ; last mammogram April 2021 . The nodule on her right breast has been nontender and has not enlarged.  She remains concerned about it and is requesting workup/reassurance .      2) Right knee pain:  her knee continues to swell and she notes an increase in pain since she returned from her 2 week tour of [place].  She did a lot of walking and  stair climbing.  Since her TKR in May 2022 , she has been enrolled in PT and is scheduled to see her orthopedist Megan.  Donney Bradley on Friday  .  She was participating in PT in the week prior to her [place] trip.   3) Cellulitis of leg :  due to neurotic itching.  Getting better since she has been keeping her hands off of her legs.   4) Mood disorder recovering finally from the general anesthesia In May.    5) Also notes that since becoming infected with COVID (unofficial diagnosis) , it has affected her sense of taste    Outpatient Medications Prior to Visit  Medication Sig Dispense Refill   ALPRAZolam (XANAX) 0.5 MG tablet Take 1 tablet by mouth twice daily as needed for anxiety 60 tablet 5   diltiazem (CARDIZEM CD) 120 MG 24 hr capsule Take  120 mg by mouth in the morning.     ezetimibe (ZETIA) 10 MG tablet TAKE 1 TABLET BY MOUTH  DAILY 90 tablet 3   furosemide (LASIX) 20 MG tablet TAKE 1 TABLET BY MOUTH  DAILY AS NEEDED FOR  SWELLING/FLUID RETENTION 90 tablet 3   hyoscyamine (LEVSIN SL) 0.125 MG SL tablet DISSOLVE 1 TABLET UNDER THE TONGUE EVERY 4 HOURS AS NEEDED 30 tablet 0   levothyroxine (SYNTHROID) 112 MCG tablet TAKE 1 TABLET BY MOUTH  DAILY BEFORE BREAKFAST 90 tablet 3   meclizine (ANTIVERT) 25 MG tablet TAKE 1 TABLET BY MOUTH THREE TIMES DAILY AS NEEDED FOR VERTIGO 30 tablet 0   omeprazole (PRILOSEC) 40 MG capsule Take 40 mg  by mouth in the morning and at bedtime.     potassium chloride (KLOR-CON) 10 MEQ tablet Take 1 tablet (10 mEq total) by mouth daily as needed (with lasix). 30 tablet 3   rosuvastatin (CRESTOR) 20 MG tablet Take 1 tablet by mouth once daily (Patient taking differently: Take 20 mg by mouth every other day. In the morning) 90 tablet 0   sertraline (ZOLOFT) 100 MG tablet Take 200 mg by mouth in the morning.     Facility-Administered Medications Prior to Visit  Medication Dose Route Frequency Provider Last Rate Last Admin   clindamycin (CLEOCIN) IVPB 600 mg  600 mg Intravenous Once Megan Park, MD        Review of Systems;  Patient denies headache, fevers, malaise, unintentional weight loss, skin rash, eye pain, sinus congestion and sinus pain, sore throat, dysphagia,  hemoptysis , cough, dyspnea, wheezing, chest pain, palpitations, orthopnea, edema, abdominal pain, nausea, melena, diarrhea, constipation, flank pain, dysuria, hematuria, urinary  Frequency, nocturia, numbness, tingling, seizures,  Focal weakness, Loss of consciousness,  Tremor, insomnia, depression, anxiety, and suicidal ideation.      Objective:  BP 130/74   Pulse 80   Temp (!) 95.8 F (35.4 C)   Ht 5' 5.51" (1.664 m)   Wt 136 lb 3.2 oz (61.8 kg)   SpO2 98%   BMI 22.31 kg/m   BP Readings from Last 3 Encounters:  03/04/21 130/74  03/01/21 124/66  02/25/21 124/66    Wt Readings from Last 3 Encounters:  03/04/21 136 lb 3.2 oz (61.8 kg)  03/01/21 137 lb 6.4 oz (62.3 kg)  02/25/21 138 lb 9.6 oz (62.9 kg)    General appearance: alert, cooperative and appears stated age Ears: normal TM's and external ear canals both ears Throat: lips, mucosa, and tongue normal; teeth and gums normal Neck: no adenopathy, no carotid bruit, supple, symmetrical, trachea midline and thyroid not enlarged, symmetric, no tenderness/mass/nodules Back: symmetric, no curvature. ROM normal. No CVA tenderness. Breast:  right breast with bb  sized hard nontender nodule at 12:00  superior position approx 2 cm  from nipple  Lungs: clear to auscultation bilaterally Heart: regular rate and rhythm, S1, S2 normal, no murmur, click, rub or gallop Abdomen: soft, non-tender; bowel sounds normal; no masses,  no organomegaly Pulses: 2+ and symmetric Skin: bilateral excoriated papules improving compared to 1 week ago ,  erythema resolved  Lymph nodes: Cervical, supraclavicular, and axillary nodes normal.  Lab Results  Component Value Date   HGBA1C 6.3 12/13/2020   HGBA1C 6.9 (H) 09/26/2020   HGBA1C 6.8 (H) 05/14/2020    Lab Results  Component Value Date   CREATININE 0.86 01/16/2021   CREATININE 0.91 12/13/2020   CREATININE 0.90 10/26/2020    Lab Results  Component Value Date  WBC 8.0 01/29/2021   HGB 11.2 (L) 01/29/2021   HCT 34.6 (L) 01/29/2021   PLT 195 01/29/2021   GLUCOSE 138 (H) 01/16/2021   CHOL 191 05/14/2020   TRIG 56.0 05/14/2020   HDL 73.70 05/14/2020   LDLDIRECT 93.0 01/02/2016   LDLCALC 106 (H) 05/14/2020   ALT 19 12/13/2020   AST 20 12/13/2020   NA 140 01/16/2021   K 4.3 01/16/2021   CL 103 01/16/2021   CREATININE 0.86 01/16/2021   BUN 14 01/16/2021   CO2 28 01/16/2021   TSH 5.08 12/13/2020   INR 1.0 09/26/2020   HGBA1C 6.3 12/13/2020   MICROALBUR <0.7 01/16/2021    No results found.  Assessment & Plan:   Problem List Items Addressed This Visit       Unprioritized   Balance problem    Persistent,  Aggravated by bilateral leg weakness,  Now s/p Right knee replacement.  Recommend continuation of PT      Breast mass in female - Primary    Right breast,  Appears to be the same nodule noted by Megan Bradley last year that was felt to be a protrusion of the breast impalnt through the capsule. Diagnostic mammogram ordered.  Per Megan Eusebio Friendly notes,  Will need an MRI of breast following the mammogram      Relevant Orders   MM DIAG BREAST TOMO BILATERAL   Cellulitis of right anterior lower leg     Secondary to neurotic itching and excoriation Off label use of gabapentin was considered but decided against due to bilateral LE edema .  She was prescribed Septra  DS x 7 days,  Diuretic daily x 3, Then advised to reduce use of  diuretic every other day, and to add antihistamine twice daily to manage the itching. The skin condition is improving       MGUS (monoclonal gammopathy of unknown significance)    Diagnosed during workup of persistent normocytic anemia.  Continue follow up with Megan Serena Colonel esophagus    She has been asymptomatic for the last year on regimen of diltiazem and PPI .  No changes today      URI (upper respiratory infection)    In the setting of  prolonged COVID exposure . POC COVID test was negative;  PCR was also negative but she lost her sense of taste, so the PCR was likely a false negative i      Other Visit Diagnoses     Need for influenza vaccination       Relevant Orders   Flu Vaccine QUAD High Dose(Fluad) (Completed)      Follow-up: No follow-ups on file.   Crecencio Mc, MD

## 2021-03-04 NOTE — Telephone Encounter (Signed)
Spoke with pt and scheduled her for an appt to have her breast examined this afternoon.

## 2021-03-04 NOTE — Progress Notes (Signed)
Hematology/Oncology Consult note [facility]  [phone] [phone]  Patient Care Team: Megan Mc, MD as PCP - General (Internal Medicine) Megan Merritts, MD as PCP - Cardiology (Cardiology)   Name of the patient: Megan Bradley  [id number]  10/15/1940   Date of visit: 03/04/21  Diagnosis-IgG kappa MGUS  Chief complaint/ Reason for visit-discuss results of blood work  Heme/Onc history: Patient is a 80 year old female with past medical history significant for type 2 diabetes hyperlipidemia hypothyroidism who has been referred for biclonal gammopathy and concern for pernicious anemia.  Her recent CBC from 01/07/2021 showed white count of 6.6, H&H of 10.4/32.5 with a platelet count of 172.  B12 level was normal at 396 in July 2022.  Iron studies in July showed an elevated ferritin of 344 with an iron saturation of 17%.  Urine protein electrophoresis showed Bence-Jones protein positive IgG kappa serum protein electrophoresis showed 2 possible abnormal protein bands that may represent monoclonal immunoglobulin light chains.  Intrinsic factor antibody was negative a year ago and positive in August 2022.  Looking back at her prior labs patient's hemoglobin was around 12 until December 2021.  It was 11.4 in April 2022 and then went down to 8.9 in May 2022 when she was admitted for right total knee replacement.  Following that her hemoglobin went up to 11.3 in July 2022   Results of blood work from 01/29/2021 were as follows: CBC showed white count of 8, H&H of 11.2/34.6 with an MCV of 91 and a platelet count of 195.  Myeloma panel showed 0.9 g of IgG kappa M protein.  Serum kappa free light chain mildly elevated at 25 with an normal light chain ratio 1.5.  Haptoglobin normal at 146.  B12 levels normal at 475.  LDH normal at 135.   Interval history-patient is doing well overall and denies any specific complaints at this time.  She is s/p bilateral breast  implants and reports a small pea-sized palpable nodule over her right chest wall.  ECOG PS- 1 Pain scale- 0  Review of systems- Review of Systems  Constitutional:  Negative for chills, fever, malaise/fatigue and weight loss.  HENT:  Negative for congestion, ear discharge and nosebleeds.   Eyes:  Negative for blurred vision.  Respiratory:  Negative for cough, hemoptysis, sputum production, shortness of breath and wheezing.   Cardiovascular:  Negative for chest pain, palpitations, orthopnea and claudication.  Gastrointestinal:  Negative for abdominal pain, blood in stool, constipation, diarrhea, heartburn, melena, nausea and vomiting.  Genitourinary:  Negative for dysuria, flank pain, frequency, hematuria and urgency.  Musculoskeletal:  Negative for back pain, joint pain and myalgias.  Skin:  Negative for rash.  Neurological:  Negative for dizziness, tingling, focal weakness, seizures, weakness and headaches.  Endo/Heme/Allergies:  Does not bruise/bleed easily.  Psychiatric/Behavioral:  Negative for depression and suicidal ideas. The patient does not have insomnia.       Allergies  Allergen Reactions   Atorvastatin Other (See Comments)    Muscle cramps and weakness   Codeine Nausea And Vomiting   Fentanyl Nausea And Vomiting   Lipitor [Atorvastatin Calcium]     Muscle cramps and weakness   Tetracyclines & Related Hives     Past Medical History:  Diagnosis Date   Anxiety    Anxiety disorder    Aortic atherosclerosis (HCC)    Arthritis    both knees   Breast disorder in female 10/14/2019   Cervical dysplasia  a. 2010 - high grade squamous intraepithelial lesion s/p excision.   Chest pain    a. 2006 or 2007 Cath Megan Bradley): reportedly nl cors;  b. 09/2011 ETT: nl.   DOE (dyspnea on exertion)    Family history of adverse reaction to anesthesia    grand daughter was hard to wake up after back surgery   GERD (gastroesophageal reflux disease)    Grade II diastolic dysfunction     Herpes zoster    Hypothyroidism    Mild aortic regurgitation    Nutcracker esophagus    Orthostatic hypotension    Pneumonia    Pre-diabetes    Ruptured left breast implant, initial encounter 07/05/2018   Sleep apnea    does not use cpap, did not tolerate   Vertigo    Wears dentures    partial lower     Past Surgical History:  Procedure Laterality Date   AUGMENTATION MAMMAPLASTY Bilateral    BREAST EXCISIONAL BIOPSY Left    CARDIAC CATHETERIZATION  2003   Dr.Callwood, R/L heart cath,   COLONOSCOPY WITH PROPOFOL N/A 06/12/2017   Procedure: COLONOSCOPY WITH PROPOFOL;  Surgeon: Megan Lame, MD;  Location: [place];  Service: Endoscopy;  Laterality: N/A;   dental work     ESOPHAGEAL MANOMETRY N/A 06/19/2016   Procedure: ESOPHAGEAL MANOMETRY (EM);  Surgeon: Megan Lame, MD;  Location: [facility];  Service: Endoscopy;  Laterality: N/A;   ESOPHAGOGASTRODUODENOSCOPY (EGD) WITH PROPOFOL N/A 04/03/2017   Procedure: ESOPHAGOGASTRODUODENOSCOPY (EGD) WITH PROPOFOL;  Surgeon: Megan Lame, MD;  Location: [place];  Service: Endoscopy;  Laterality: N/A;  diabetic - diet controlled   THYROIDECTOMY  05/15/15   [facility]   TONSILLECTOMY     TOTAL KNEE ARTHROPLASTY Right 10/25/2020   Procedure: RIGHT TOTAL KNEE ARTHROPLASTY;  Surgeon: Megan Park, MD;  Location: [facility];  Service: Orthopedics;  Laterality: Right;    Social History   Socioeconomic History   Marital status: Married    Spouse name: Megan Bradley   Number of children: Not on file   Years of education: Not on file   Highest education level: Not on file  Occupational History   Not on file  Tobacco Use   Smoking status: Never   Smokeless tobacco: Never  Vaping Use   Vaping Use: Never used  Substance and Sexual Activity   Alcohol use: Yes    Comment: wine once a week   Drug use: No   Sexual activity: Never  Other Topics Concern   Not on file  Social History Narrative   Lives in [place].  Active but  doesn't exercise. Retired from [employer].   Social Determinants of Health   Financial Resource Strain: Not on file  Food Insecurity: Not on file  Transportation Needs: Not on file  Physical Activity: Not on file  Stress: Not on file  Social Connections: Not on file  Intimate Partner Violence: Not on file    Family History  Problem Relation Age of Onset   Heart attack Mother    Alcohol abuse Mother    Mental illness Mother        died in her 65's of alzheimer's Dementia   Heart disease Mother 41   Heart attack Father    Heart disease Father 24       AMI - died @ 90.   Colon cancer Paternal Grandmother 64     Current Outpatient Medications:    ALPRAZolam (XANAX) 0.5 MG tablet, Take 1 tablet by mouth twice daily  as needed for anxiety, Disp: 60 tablet, Rfl: 5   diltiazem (CARDIZEM CD) 120 MG 24 hr capsule, Take 120 mg by mouth in the morning., Disp: , Rfl:    ezetimibe (ZETIA) 10 MG tablet, TAKE 1 TABLET BY MOUTH  DAILY, Disp: 90 tablet, Rfl: 3   furosemide (LASIX) 20 MG tablet, TAKE 1 TABLET BY MOUTH  DAILY AS NEEDED FOR  SWELLING/FLUID RETENTION, Disp: 90 tablet, Rfl: 3   hyoscyamine (LEVSIN SL) 0.125 MG SL tablet, DISSOLVE 1 TABLET UNDER THE TONGUE EVERY 4 HOURS AS NEEDED, Disp: 30 tablet, Rfl: 0   levothyroxine (SYNTHROID) 112 MCG tablet, TAKE 1 TABLET BY MOUTH  DAILY BEFORE BREAKFAST, Disp: 90 tablet, Rfl: 3   meclizine (ANTIVERT) 25 MG tablet, TAKE 1 TABLET BY MOUTH THREE TIMES DAILY AS NEEDED FOR VERTIGO, Disp: 30 tablet, Rfl: 0   omeprazole (PRILOSEC) 40 MG capsule, Take 40 mg by mouth in the morning and at bedtime., Disp: , Rfl:    potassium chloride (KLOR-CON) 10 MEQ tablet, Take 1 tablet (10 mEq total) by mouth daily as needed (with lasix)., Disp: 30 tablet, Rfl: 3   rosuvastatin (CRESTOR) 20 MG tablet, Take 1 tablet by mouth once daily (Patient taking differently: Take 20 mg by mouth every other day. In the morning), Disp: 90 tablet, Rfl: 0   sertraline (ZOLOFT) 100 MG  tablet, Take 200 mg by mouth in the morning., Disp: , Rfl:  No current facility-administered medications for this visit.  Facility-Administered Medications Ordered in Other Visits:    clindamycin (CLEOCIN) IVPB 600 mg, 600 mg, Intravenous, Once, Megan Park, MD  Physical exam:  Vitals:   03/01/21 1407  BP: 124/66  Pulse: 69  Resp: 16  Temp: 97.6 F (36.4 C)  SpO2: 98%  Weight: 137 lb 6.4 oz (62.3 kg)   Physical Exam Constitutional:      General: She is not in acute distress. Cardiovascular:     Rate and Rhythm: Normal rate and regular rhythm.     Heart sounds: Normal heart sounds.  Pulmonary:     Effort: Pulmonary effort is normal.     Breath sounds: Normal breath sounds.  Abdominal:     General: Bowel sounds are normal.     Palpations: Abdomen is soft.  Skin:    General: Skin is warm and dry.  Neurological:     Mental Status: She is alert and oriented to person, place, and time.    Breast/chest wall exam.  Patient is s/p bilateral breast implants.  There is a small pea-sized nodule palpated superficially over the right chest wall at around 2 o'clock position but no discrete palpable mass.   CMP Latest Ref Rng & Units 01/16/2021  Glucose 70 - 99 mg/dL 138(H)  BUN 6 - 23 mg/dL 14  Creatinine 0.40 - 1.20 mg/dL 0.86  Sodium 135 - 145 mEq/L 140  Potassium 3.5 - 5.1 mEq/L 4.3  Chloride 96 - 112 mEq/L 103  CO2 19 - 32 mEq/L 28  Calcium 8.4 - 10.5 mg/dL 9.2  Total Protein 6.1 - 8.1 g/dL 7.2  Total Bilirubin 0.2 - 1.2 mg/dL -  Alkaline Phos 39 - 117 U/L -  AST 0 - 37 U/L -  ALT 0 - 35 U/L -   CBC Latest Ref Rng & Units 01/29/2021  WBC 4.0 - 10.5 K/uL 8.0  Hemoglobin 12.0 - 15.0 g/dL 11.2(L)  Hematocrit 36.0 - 46.0 % 34.6(L)  Platelets 150 - 400 K/uL 195     Assessment and  plan- Patient is a 80 y.o. female with IgG kappa MGUS here to discuss results of blood work and further management  IgG kappa MGUS:Patient had a small M protein of 0.6 g detected on SPEP and  immunofixation.  IgG levels are normal.  Kappa light chain mildly elevated but normal kappa light chain ratio of 1.5.  She does not require any bone marrow biopsy at this time and as such her MGUS can be monitored conservatively.  She does have mild anemia with a hemoglobin of 11.2 but her hemoglobin had dropped to 8.9 in May 2022 after her knee surgery and gradually picking up.  Continue to monitor.  CBC CMP myeloma panel and serum free light chains in 6 months and I will see her thereafter  Patient does have a palpable pea-sized nodule over her right breast implant which does not appear to be a discrete palpable mass But it would be prudent to proceed with screening mammogram at this time.  She did have 1 last April which was negative.  I will reach out to Dr. Derrel Nip regarding this   Visit Diagnosis 1. MGUS (monoclonal gammopathy of unknown significance)      Dr. Randa Evens, MD, MPH [facility] [phone] 03/04/2021 8:22 AM

## 2021-03-04 NOTE — Patient Instructions (Signed)
  I have ordered a "diagnostic mammogram" so the nodule can be examined in greater detail     Watch the [employer] visits  [place]

## 2021-03-05 DIAGNOSIS — H5203 Hypermetropia, bilateral: Secondary | ICD-10-CM | POA: Diagnosis not present

## 2021-03-05 DIAGNOSIS — Z9842 Cataract extraction status, left eye: Secondary | ICD-10-CM | POA: Diagnosis not present

## 2021-03-05 DIAGNOSIS — N63 Unspecified lump in unspecified breast: Secondary | ICD-10-CM | POA: Insufficient documentation

## 2021-03-05 DIAGNOSIS — E119 Type 2 diabetes mellitus without complications: Secondary | ICD-10-CM | POA: Diagnosis not present

## 2021-03-05 DIAGNOSIS — H52223 Regular astigmatism, bilateral: Secondary | ICD-10-CM | POA: Diagnosis not present

## 2021-03-05 DIAGNOSIS — Z9841 Cataract extraction status, right eye: Secondary | ICD-10-CM | POA: Diagnosis not present

## 2021-03-05 LAB — HM DIABETES EYE EXAM

## 2021-03-05 NOTE — Assessment & Plan Note (Signed)
Right breast,  Appears to be the same nodule noted by Dr Marla Roe last year that was felt to be a protrusion of the breast impalnt through the capsule. Diagnostic mammogram ordered.  Per Dr Eusebio Friendly notes,  Will need an MRI of breast following the mammogram

## 2021-03-05 NOTE — Assessment & Plan Note (Signed)
Diagnosed during workup of persistent normocytic anemia.  Continue follow up with Dr Rao 

## 2021-03-05 NOTE — Assessment & Plan Note (Signed)
She has been asymptomatic for the last year on regimen of diltiazem and PPI .  No changes today

## 2021-03-05 NOTE — Assessment & Plan Note (Signed)
In the setting of  prolonged COVID exposure . POC COVID test was negative;  PCR was also negative but she lost her sense of taste, so the PCR was likely a false negative i

## 2021-03-05 NOTE — Assessment & Plan Note (Signed)
Secondary to neurotic itching and excoriation Off label use of gabapentin was considered but decided against due to bilateral LE edema .  She was prescribed Septra  DS x 7 days,  Diuretic daily x 3, Then advised to reduce use of  diuretic every other day, and to add antihistamine twice daily to manage the itching. The skin condition is improving

## 2021-03-05 NOTE — Assessment & Plan Note (Signed)
Persistent,  Aggravated by bilateral leg weakness,  Now s/p Right knee replacement.  Recommend continuation of PT

## 2021-03-08 DIAGNOSIS — Z96651 Presence of right artificial knee joint: Secondary | ICD-10-CM | POA: Diagnosis not present

## 2021-03-08 DIAGNOSIS — M25561 Pain in right knee: Secondary | ICD-10-CM | POA: Diagnosis not present

## 2021-03-14 ENCOUNTER — Ambulatory Visit
Admission: RE | Admit: 2021-03-14 | Discharge: 2021-03-14 | Disposition: A | Discharge status: Home / Self Care | Payer: Medicare Other | Source: Ambulatory Visit | Attending: Internal Medicine | Admitting: Internal Medicine

## 2021-03-14 ENCOUNTER — Other Ambulatory Visit: Payer: Self-pay

## 2021-03-14 DIAGNOSIS — N63 Unspecified lump in unspecified breast: Secondary | ICD-10-CM

## 2021-03-14 DIAGNOSIS — R922 Inconclusive mammogram: Secondary | ICD-10-CM | POA: Diagnosis not present

## 2021-03-14 DIAGNOSIS — N6311 Unspecified lump in the right breast, upper outer quadrant: Secondary | ICD-10-CM | POA: Diagnosis not present

## 2021-04-06 ENCOUNTER — Other Ambulatory Visit: Payer: Self-pay | Admitting: Internal Medicine

## 2021-04-07 DIAGNOSIS — Z96651 Presence of right artificial knee joint: Secondary | ICD-10-CM | POA: Diagnosis not present

## 2021-05-13 DIAGNOSIS — Z96651 Presence of right artificial knee joint: Secondary | ICD-10-CM | POA: Diagnosis not present

## 2021-05-14 ENCOUNTER — Ambulatory Visit (INDEPENDENT_AMBULATORY_CARE_PROVIDER_SITE_OTHER): Payer: Medicare Other | Admitting: Internal Medicine

## 2021-05-14 ENCOUNTER — Telehealth: Payer: Self-pay | Admitting: Internal Medicine

## 2021-05-14 ENCOUNTER — Encounter: Payer: Self-pay | Admitting: Internal Medicine

## 2021-05-14 ENCOUNTER — Other Ambulatory Visit: Payer: Self-pay

## 2021-05-14 VITALS — BP 126/60 | HR 79 | Temp 95.4°F | Ht 65.0 in | Wt 141.2 lb

## 2021-05-14 DIAGNOSIS — R600 Localized edema: Secondary | ICD-10-CM | POA: Diagnosis not present

## 2021-05-14 DIAGNOSIS — N63 Unspecified lump in unspecified breast: Secondary | ICD-10-CM

## 2021-05-14 MED ORDER — TRIAMCINOLONE ACETONIDE 0.1 % EX CREA: 1.0000 "application " | TOPICAL_CREAM | Freq: Two times a day (BID) | CUTANEOUS | 0 refills | Status: DC

## 2021-05-14 NOTE — Telephone Encounter (Signed)
Can either of you please call ms Suire and offer her an appt today at 12:30 for evaluation of her bilateral leg swelling?  Thank you

## 2021-05-14 NOTE — Assessment & Plan Note (Signed)
Chronic venous insufficiency vs early lymphedema suspected.  Bilateral LE ultrasound to be rescheduled.   resume use of compression stockings and resume lower dose of furosemide

## 2021-05-14 NOTE — Telephone Encounter (Signed)
noted 

## 2021-05-14 NOTE — Progress Notes (Signed)
Subjective:  Patient ID: Megan Bradley, female    DOB: [birth year]  Age: 80 y.o. MRN: [id number]  CC: The primary encounter diagnosis was Bilateral edema of lower extremity. Diagnoses of Breast mass in female and Bilateral lower extremity edema were also pertinent to this visit.  HPI Megan Bradley presents for  Chief Complaint  Patient presents with   Acute Visit    Bilateral leg swelling   This visit occurred during the SARS-CoV-2 public health emergency.  Safety protocols were in place, including screening questions prior to the visit, additional usage of staff PPE, and extensive cleaning of exam room while observing appropriate contact time as indicated for disinfecting solutions.    History of TKR right knee May 2022 Normal ECHO April 2022 for same MGUS managed with surveillance labs by Dr Janese Banks last seen Sept 2022 Chronic venous insufficiency,  atopic dermatitis with recurrent  cellulitis .  Seen by Vascular in April 2022 w/concerns for lymphedema,  ultrasound ordered and compression stockings advised (see below): ultrasound was ordered but not done and not rescheduled for unclear reasons, so she has not had definitive diangosis ,   wore compression stockings for 2 moths but not regularly since then . Fluid starting accumulating 2 weeks.  No recent travel,  no fevers.  Continues to pick at legs. Using dial soap.  Weights at home have fluctuated on a daily basis and has not changed by more than 2 lbs    For the last 3 days has been taking furosemide 40 mg daily and potassium   1. Leg swelling There is concern that the patient's lower extremity edema is related to lymphedema, and they are concerned that upcoming knee surgery will make this worse.  It is certainly possible that her swelling is related to chronic venous insufficiency as well.  Lymphedema is typically a chronic condition that is managed by the use of medical grade 1 compression stockings daily elevation and exercise.  The  patient is advised to use compression stockings post  surgery and we will plan on following up shortly thereafter with noninvasive studies to help further evaluate causes of lower extremity edema. - VAS [place] LOWER EXTREMITY VENOUS REFLUX; Future  Patient states that over the last several days (weeks?)  her Weight has increased from 136 to 142 ; however readings on home scales    Outpatient Medications Prior to Visit  Medication Sig Dispense Refill   ALPRAZolam (XANAX) 0.5 MG tablet Take 1 tablet by mouth twice daily as needed for anxiety 60 tablet 5   diltiazem (CARDIZEM CD) 120 MG 24 hr capsule Take 120 mg by mouth in the morning.     ezetimibe (ZETIA) 10 MG tablet TAKE 1 TABLET BY MOUTH  DAILY 90 tablet 3   furosemide (LASIX) 20 MG tablet TAKE 1 TABLET BY MOUTH  DAILY AS NEEDED FOR  SWELLING/FLUID RETENTION 90 tablet 3   hyoscyamine (LEVSIN SL) 0.125 MG SL tablet DISSOLVE 1 TABLET UNDER THE TONGUE EVERY 4 HOURS AS NEEDED 30 tablet 0   levothyroxine (SYNTHROID) 112 MCG tablet TAKE 1 TABLET BY MOUTH  DAILY BEFORE BREAKFAST 90 tablet 3   meclizine (ANTIVERT) 25 MG tablet TAKE 1 TABLET BY MOUTH THREE TIMES DAILY AS NEEDED FOR VERTIGO 30 tablet 0   omeprazole (PRILOSEC) 40 MG capsule Take 40 mg by mouth in the morning and at bedtime.     potassium chloride (KLOR-CON) 10 MEQ tablet Take 1 tablet (10 mEq total) by mouth daily as needed (  with lasix). 30 tablet 3   rosuvastatin (CRESTOR) 20 MG tablet Take 1 tablet by mouth once daily (Patient taking differently: Take 20 mg by mouth every other day. In the morning) 90 tablet 0   sertraline (ZOLOFT) 100 MG tablet TAKE 2 TABLETS BY MOUTH  DAILY 180 tablet 3   amoxicillin-clavulanate (AUGMENTIN) 875-125 MG tablet Take 1 tablet by mouth 2 (two) times daily. (Patient not taking: Reported on 05/14/2021)     Facility-Administered Medications Prior to Visit  Medication Dose Route Frequency Provider Last Rate Last Admin   clindamycin (CLEOCIN) IVPB 600 mg  600 mg  Intravenous Once [place], MD        Review of Systems;  Patient denies headache, fevers, malaise, unintentional weight loss, skin rash, eye pain, sinus congestion and sinus pain, sore throat, dysphagia,  hemoptysis , cough, dyspnea, wheezing, chest pain, palpitations, orthopnea, edema, abdominal pain, nausea, melena, diarrhea, constipation, flank pain, dysuria, hematuria, urinary  Frequency, nocturia, numbness, tingling, seizures,  Focal weakness, Loss of consciousness,  Tremor, insomnia, depression, anxiety, and suicidal ideation.      Objective:  BP 126/60 (BP Location: Left Arm, Patient Position: Sitting, Cuff Size: Normal)   Pulse 79   Temp (!) 95.4 F (35.2 C) (Temporal)   Ht 5\' 5"  (1.651 m)   Wt 141 lb 3.2 oz (64 kg)   SpO2 97%   BMI 23.50 kg/m   BP Readings from Last 3 Encounters:  05/14/21 126/60  03/04/21 130/74  03/01/21 124/66    Wt Readings from Last 3 Encounters:  05/14/21 141 lb 3.2 oz (64 kg)  03/04/21 136 lb 3.2 oz (61.8 kg)  03/01/21 137 lb 6.4 oz (62.3 kg)    General appearance: alert, cooperative and appears stated age Ears: normal TM's and external ear canals both ears Throat: lips, mucosa, and tongue normal; teeth and gums normal Neck: no adenopathy, no carotid bruit, supple, symmetrical, trachea midline and thyroid not enlarged, symmetric, no tenderness/mass/nodules Back: symmetric, no curvature. ROM normal. No CVA tenderness. Lungs: clear to auscultation bilaterally Heart: regular rate and rhythm, S1, S2 normal, no murmur, click, rub or gallop Abdomen: soft, non-tender; bowel sounds normal; no masses,  no organomegaly Pulses: 2+ and symmetric Skin: Skin color, texture, turgor normal. No rashes or lesions Lymph nodes: Cervical, supraclavicular, and axillary nodes normal.  Lab Results  Component Value Date   HGBA1C 6.3 12/13/2020   HGBA1C 6.9 (H) 09/26/2020   HGBA1C 6.8 (H) 05/14/2020    Lab Results  Component Value Date   CREATININE  0.86 01/16/2021   CREATININE 0.91 12/13/2020   CREATININE 0.90 10/26/2020    Lab Results  Component Value Date   WBC 8.0 01/29/2021   HGB 11.2 (L) 01/29/2021   HCT 34.6 (L) 01/29/2021   PLT 195 01/29/2021   GLUCOSE 138 (H) 01/16/2021   CHOL 191 05/14/2020   TRIG 56.0 05/14/2020   HDL 73.70 05/14/2020   LDLDIRECT 93.0 01/02/2016   LDLCALC 106 (H) 05/14/2020   ALT 19 12/13/2020   AST 20 12/13/2020   NA 140 01/16/2021   K 4.3 01/16/2021   CL 103 01/16/2021   CREATININE 0.86 01/16/2021   BUN 14 01/16/2021   CO2 28 01/16/2021   TSH 5.08 12/13/2020   INR 1.0 09/26/2020   HGBA1C 6.3 12/13/2020   MICROALBUR <0.7 01/16/2021    US BREAST LTD UNI RIGHT INC AXILLA  Result Date: 03/14/2021 CLINICAL DATA:  80 year old female presenting for evaluation of a palpable lump in the right breast.  The patient has bilateral retroglandular implants which were replaced 3 years ago following rupture. Her initial implants were placed in 1974. EXAM: DIGITAL DIAGNOSTIC BILATERAL MAMMOGRAM WITH IMPLANTS, CAD AND TOMOSYNTHESIS; ULTRASOUND RIGHT BREAST LIMITED TECHNIQUE: Bilateral digital diagnostic mammography and breast tomosynthesis was performed. The images were evaluated with computer-aided detection. Standard and/or implant displaced views were performed.; Targeted ultrasound examination of the right breast was performed COMPARISON:  Previous exam(s). ACR Breast Density Category b: There are scattered areas of fibroglandular density. FINDINGS: A BB indicating the palpable site of concern has been placed along the superior aspect of the right breast. No suspicious findings are seen deep to the palpable marker within the breast tissue. There is a focal folding of the implant at this site. No suspicious calcifications, masses or areas of distortion are seen in the bilateral breasts. The patient has retroglandular implants. Ultrasound targeted to the palpable site right breast at 12 o'clock, approximately 10 cm  from the nipple demonstrates a focal folding of the implant which protrudes close to the skin surface. There is no snowstorm pattern to suggest extracapsular silicone rupture. No suspicious findings are found within the breast parenchyma. IMPRESSION: 1. The palpable lump in the right breast corresponds with a focal fold within the patient's breast implant. 2.  No mammographic evidence of malignancy in the bilateral breasts. RECOMMENDATION: Screening mammogram in one year.(Code:SM-B-01Y) I have discussed the findings and recommendations with the patient. If applicable, a reminder letter will be sent to the patient regarding the next appointment. BI-RADS CATEGORY  1: Negative. Electronically Signed   By: Ammie Ferrier M.D.   On: 03/14/2021 16:06  MM DIAG BREAST W/IMPLANT TOMO BILATERAL  Result Date: 03/14/2021 CLINICAL DATA:  80 year old female presenting for evaluation of a palpable lump in the right breast. The patient has bilateral retroglandular implants which were replaced 3 years ago following rupture. Her initial implants were placed in 1974. EXAM: DIGITAL DIAGNOSTIC BILATERAL MAMMOGRAM WITH IMPLANTS, CAD AND TOMOSYNTHESIS; ULTRASOUND RIGHT BREAST LIMITED TECHNIQUE: Bilateral digital diagnostic mammography and breast tomosynthesis was performed. The images were evaluated with computer-aided detection. Standard and/or implant displaced views were performed.; Targeted ultrasound examination of the right breast was performed COMPARISON:  Previous exam(s). ACR Breast Density Category b: There are scattered areas of fibroglandular density. FINDINGS: A BB indicating the palpable site of concern has been placed along the superior aspect of the right breast. No suspicious findings are seen deep to the palpable marker within the breast tissue. There is a focal folding of the implant at this site. No suspicious calcifications, masses or areas of distortion are seen in the bilateral breasts. The patient has  retroglandular implants. Ultrasound targeted to the palpable site right breast at 12 o'clock, approximately 10 cm from the nipple demonstrates a focal folding of the implant which protrudes close to the skin surface. There is no snowstorm pattern to suggest extracapsular silicone rupture. No suspicious findings are found within the breast parenchyma. IMPRESSION: 1. The palpable lump in the right breast corresponds with a focal fold within the patient's breast implant. 2.  No mammographic evidence of malignancy in the bilateral breasts. RECOMMENDATION: Screening mammogram in one year.(Code:SM-B-01Y) I have discussed the findings and recommendations with the patient. If applicable, a reminder letter will be sent to the patient regarding the next appointment. BI-RADS CATEGORY  1: Negative. Electronically Signed   By: Ammie Ferrier M.D.   On: 03/14/2021 16:06    Assessment & Plan:   Problem List Items Addressed This Visit  Bilateral lower extremity edema    Chronic venous insufficiency vs early lymphedema suspected.  Bilateral LE ultrasound to be rescheduled.   resume use of compression stockings and resume lower dose of furosemide       RESOLVED: Breast mass in female   Other Visit Diagnoses     Bilateral edema of lower extremity    -  Primary   Relevant Orders   Basic metabolic panel       I am having Shaquera B. Costello start on triamcinolone cream. I am also having her maintain her diltiazem, omeprazole, rosuvastatin, meclizine, ezetimibe, hyoscyamine, potassium chloride, ALPRAZolam, levothyroxine, furosemide, sertraline, and amoxicillin-clavulanate.  Meds ordered this encounter  Medications   triamcinolone cream (KENALOG) 0.1 %    Sig: Apply 1 application topically 2 (two) times daily.    Dispense:  453.6 g    Refill:  0     I provided  30 minutes of  face-to-face time during this encounter reviewing patient's current problems and past surgeries, labs and imaging studies, providing  counseling on the above mentioned problems , and coordination  of care .   Follow-up: No follow-ups on file.   Crecencio Mc, MD

## 2021-05-14 NOTE — Patient Instructions (Addendum)
To prevent infection/cellulitis:   Continue daily use of Dial soap  Use the surgical soap "hibiclens" once a week  STOP TOUCHING LEGS!     Shower at night, moisturize at night.  Apply the steroid cream of you are itching (triamcinolone)   Place stockings EVERY MORNING BEFORE YOU GET OUT OF BED. .  Try using [employer] medicated powder with zinc before putting on stocks

## 2021-05-15 ENCOUNTER — Other Ambulatory Visit: Payer: Self-pay | Admitting: Internal Medicine

## 2021-05-15 LAB — BASIC METABOLIC PANEL
BUN: 16 mg/dL (ref 6–23)
CO2: 30 mEq/L (ref 19–32)
Calcium: 9.1 mg/dL (ref 8.4–10.5)
Chloride: 102 mEq/L (ref 96–112)
Creatinine, Ser: 0.89 mg/dL (ref 0.40–1.20)
GFR: 61.92 mL/min (ref >60.00)
Glucose, Bld: 69 mg/dL — ABNORMAL LOW (ref 70–99)
Potassium: 3.9 mEq/L (ref 3.5–5.1)
Sodium: 140 mEq/L (ref 135–145)

## 2021-05-17 ENCOUNTER — Other Ambulatory Visit: Payer: Self-pay | Admitting: Internal Medicine

## 2021-05-22 ENCOUNTER — Other Ambulatory Visit: Payer: Self-pay | Admitting: Internal Medicine

## 2021-06-13 DIAGNOSIS — C44519 Basal cell carcinoma of skin of other part of trunk: Secondary | ICD-10-CM | POA: Diagnosis not present

## 2021-06-13 DIAGNOSIS — L570 Actinic keratosis: Secondary | ICD-10-CM | POA: Diagnosis not present

## 2021-06-13 DIAGNOSIS — D2261 Melanocytic nevi of right upper limb, including shoulder: Secondary | ICD-10-CM | POA: Diagnosis not present

## 2021-06-13 DIAGNOSIS — X32XXXA Exposure to sunlight, initial encounter: Secondary | ICD-10-CM | POA: Diagnosis not present

## 2021-06-13 DIAGNOSIS — D485 Neoplasm of uncertain behavior of skin: Secondary | ICD-10-CM | POA: Diagnosis not present

## 2021-06-13 DIAGNOSIS — L281 Prurigo nodularis: Secondary | ICD-10-CM | POA: Diagnosis not present

## 2021-06-13 DIAGNOSIS — L853 Xerosis cutis: Secondary | ICD-10-CM | POA: Diagnosis not present

## 2021-06-13 DIAGNOSIS — L821 Other seborrheic keratosis: Secondary | ICD-10-CM | POA: Diagnosis not present

## 2021-06-13 DIAGNOSIS — C4441 Basal cell carcinoma of skin of scalp and neck: Secondary | ICD-10-CM | POA: Diagnosis not present

## 2021-06-13 DIAGNOSIS — L2089 Other atopic dermatitis: Secondary | ICD-10-CM | POA: Diagnosis not present

## 2021-06-13 DIAGNOSIS — D2271 Melanocytic nevi of right lower limb, including hip: Secondary | ICD-10-CM | POA: Diagnosis not present

## 2021-07-05 ENCOUNTER — Encounter: Payer: Self-pay | Admitting: Cardiovascular Disease

## 2021-07-05 ENCOUNTER — Ambulatory Visit: Payer: Medicare Other | Admitting: Cardiovascular Disease

## 2021-07-05 ENCOUNTER — Other Ambulatory Visit: Payer: Self-pay

## 2021-07-05 VITALS — BP 130/60 | HR 64 | Ht 65.0 in | Wt 141.5 lb

## 2021-07-05 DIAGNOSIS — E782 Mixed hyperlipidemia: Secondary | ICD-10-CM

## 2021-07-05 DIAGNOSIS — I700 Atherosclerosis of aorta: Secondary | ICD-10-CM | POA: Diagnosis not present

## 2021-07-05 DIAGNOSIS — R0609 Other forms of dyspnea: Secondary | ICD-10-CM

## 2021-07-05 DIAGNOSIS — I10 Essential (primary) hypertension: Secondary | ICD-10-CM | POA: Diagnosis not present

## 2021-07-05 DIAGNOSIS — R600 Localized edema: Secondary | ICD-10-CM | POA: Diagnosis not present

## 2021-07-05 DIAGNOSIS — I5032 Chronic diastolic (congestive) heart failure: Secondary | ICD-10-CM

## 2021-07-05 MED ORDER — ISOSORBIDE MONONITRATE ER 30 MG PO TB24: 30.0000 mg | ORAL_TABLET | Freq: Every day | ORAL | 2 refills | Status: DC

## 2021-07-05 NOTE — Patient Instructions (Addendum)
Medication Instructions:  - Your physician has recommended you make the following change in your medication:   1) START isosorbide mononitrate (imdur) 30 mg: - take 1 tablet by mouth once a day  2) STOP diltiazem Lets see if this helps the leg swelling  3) TAKE lasix/furosemide sparingly 1-2 x a week for swelling  If you need a refill on your cardiac medications before your next appointment, please call your pharmacy.   Lab work: No new labs needed  Testing/Procedures: - You have been referred to: [facility] for leg swelling  You will be contacted directly by their office with an appointment. If you do not hear from them in the next week, please call their office directly at [phone] to follow up.   Follow-Up: At [facility], you and your health needs are our priority.  As part of our continuing mission to provide you with exceptional heart care, we have created designated Provider Care Teams.  These Care Teams include your primary Cardiologist (physician) and Advanced Practice Providers (APPs -  Physician Assistants and Nurse Practitioners) who all work together to provide you with the care you need, when you need it.  You will need a follow up appointment in 6 months  Providers on your designated Care Team:   Murray Hodgkins, NP Christell Faith, PA-C Cadence Kathlen Mody, [place]  COVID-19 Vaccine Information can be found at: [email or web address].uk For questions related to vaccine distribution or appointments, please email [email or web address] or call [phone].    Isosorbide Mononitrate Extended-Release Tablets What is this medication? ISOSORBIDE MONONITRATE (eye soe SOR bide mon oh NYE trate) prevents chest pain (angina). It works by relaxing blood vessels, which decreases the amount of work the heart has to do. It belongs to a group of medications called nitrates. Do not use it to treat sudden chest  pain. This medicine may be used for other purposes; ask your health care provider or pharmacist if you have questions. COMMON BRAND NAME(S): Imdur, Isotrate ER What should I tell my care team before I take this medication? They need to know if you have any of these conditions: Previous heart attack or heart failure An unusual or allergic reaction to isosorbide mononitrate, nitrates, other medications, foods, dyes, or preservatives Pregnant or trying to get pregnant Breast-feeding How should I use this medication? Take this medication by mouth with a glass of water. Follow the directions on the prescription label. Do not crush or chew. Take your medication at regular intervals. Do not take your medication more often than directed. Do not stop taking this medication except on the advice of your care team. Talk to your care team about the use of this medication in children. Special care may be needed. Overdosage: If you think you have taken too much of this medicine contact a poison control center or emergency room at once. NOTE: This medicine is only for you. Do not share this medicine with others. What if I miss a dose? If you miss a dose, take it as soon as you can. If it is almost time for your next dose, take only that dose. Do not take double or extra doses. What may interact with this medication? Do not take this medication with any of the following: Medications used to treat erectile dysfunction (ED) like avanafil, sildenafil, tadalafil, and vardenafil Ri[place]at This medication may also interact with the following: Medications for high blood pressure Other medications for angina or heart failure This list may not describe all possible interactions.  Give your health care provider a list of all the medicines, herbs, non-prescription drugs, or dietary supplements you use. Also tell them if you smoke, drink alcohol, or use illegal drugs. Some items may interact with your medicine. What  should I watch for while using this medication? Check your heart rate and blood pressure regularly while you are taking this medication. Ask your care team what your heart rate and blood pressure should be and when you should contact him or her. Tell your care team if you feel your medication is no longer working. You may get dizzy. Do not drive, use machinery, or do anything that needs mental alertness until you know how this medication affects you. To reduce the risk of dizzy or fainting spells, do not sit or stand up quickly, especially if you are an older patient. Alcohol can make you more dizzy, and increase flushing and rapid heartbeats. Avoid alcoholic drinks. Do not treat yourself for coughs, colds, or pain while you are taking this medication without asking your care team for advice. Some ingredients may increase your blood pressure. What side effects may I notice from receiving this medication? Side effects that you should report to your care team as soon as possible: Allergic reactions--skin rash, itching, hives, swelling of the face, lips, tongue, or throat Headache, unusual weakness or fatigue, shortness of breath, nausea, vomiting, rapid heartbeat, blue skin or lips, which may be signs of methemoglobinemia Increased pressure around the brain--severe headache, blurry vision, change in vision, nausea, vomiting Low blood pressure--dizziness, feeling faint or lightheaded, blurry vision Slow heartbeat--dizziness, feeling faint or lightheaded, confusion, trouble breathing, unusual weakness or fatigue Worsening chest pain (angina)--pain, pressure, or tightness in the chest, neck, back, or arms Side effects that usually do not require medical attention (report to your care team if they continue or are bothersome): Dizziness Flushing Headache This list may not describe all possible side effects. Call your doctor for medical advice about side effects. You may report side effects to FDA at  1-800-FDA-1088. Where should I keep my medication? Keep out of the reach of children. Store between 15 and 30 degrees C (59 and 86 degrees F). Keep container tightly closed. Throw away any unused medication after the expiration date. NOTE: This sheet is a summary. It may not cover all possible information. If you have questions about this medicine, talk to your doctor, pharmacist, or health care provider.  2022 Elsevier/Gold Standard (2020-10-10 00:00:00)

## 2021-07-05 NOTE — Progress Notes (Signed)
Cardiology Office Note  Date:  07/05/2021   ID:  SHEYLA ZAFFINO, DOB 1940-07-13, MRN [id number]  PCP:  Crecencio Mc, MD   Chief Complaint  Patient presents with   6 month follow up     "Doing well." Patient would like a referral to a Vascular Surgeon for leg swelling. Medications reviewed by the patient verbally.     HPI:  Ms. Luckadoo is a 81 year old woman past medical history of  syncope , symptoms improved by holding  lisinopril and HCTZ.   chronic chest pain felt secondary to spasm,  previous cardiac catheterization with no coronary disease into 2007,  " Jackhammer esophagus", spasm  she presents for routine followup of her chest pain, shortness of breath  Last seen in clinic july 2022 On today's visit reports having worsening leg swelling Both legs Completed knee surgery on right Wearing compression hose on a regular basis Would like referral to vascular to discuss her leg swelling  Reports rare issues with her  "jackhammer esophagus", take levsin Previously tried nitro sublingual, did not seem to work as well  No regular exercise program, sedentary Previously participated in home PT  She has not been taking Lasix even on as-needed basis  EKG personally reviewed by myself on todays visit normal sinus rhythm with rate 64 beats per minute with right bundle branch block, no other significant ST or T wave changes  Other past medical history reviewed Knee surgery 10/2020 10/25/2020 and underwent an uncomplicated right total knee arthroplasty.  Had delerium HGB 8.7, Has recovered: 11.3   chest pain 2013, normal CT scan chest, normal exercise treadmill test, Normal LV function by echocardiogram   Previous echocardiogram many years ago showed diastolic dysfunction with normal LV function    PMH:   has a past medical history of Anxiety, Anxiety disorder, Aortic atherosclerosis ([place]), Arthritis, Breast disorder in female (10/14/2019), Cervical dysplasia, Chest pain, DOE (dyspnea  on exertion), Family history of adverse reaction to anesthesia, GERD (gastroesophageal reflux disease), Grade II diastolic dysfunction, Herpes zoster, Hypothyroidism, Mild aortic regurgitation, Nutcracker esophagus, Orthostatic hypotension, Pneumonia, Pre-diabetes, Ruptured left breast implant, initial encounter (07/05/2018), Sleep apnea, Vertigo, and Wears dentures.  PSH:    Past Surgical History:  Procedure Laterality Date   AUGMENTATION MAMMAPLASTY Bilateral    BREAST EXCISIONAL BIOPSY Left    CARDIAC CATHETERIZATION  2003   Dr.Callwood, R/L heart cath,   COLONOSCOPY WITH PROPOFOL N/A 06/12/2017   Procedure: COLONOSCOPY WITH PROPOFOL;  Surgeon: Lucilla Lame, MD;  Location: [facility];  Service: Endoscopy;  Laterality: N/A;   dental work     ESOPHAGEAL MANOMETRY N/A 06/19/2016   Procedure: ESOPHAGEAL MANOMETRY (EM);  Surgeon: Lucilla Lame, MD;  Location: [facility];  Service: Endoscopy;  Laterality: N/A;   ESOPHAGOGASTRODUODENOSCOPY (EGD) WITH PROPOFOL N/A 04/03/2017   Procedure: ESOPHAGOGASTRODUODENOSCOPY (EGD) WITH PROPOFOL;  Surgeon: Lucilla Lame, MD;  Location: [facility];  Service: Endoscopy;  Laterality: N/A;  diabetic - diet controlled   THYROIDECTOMY  05/15/15   [facility]   TONSILLECTOMY     TOTAL KNEE ARTHROPLASTY Right 10/25/2020   Procedure: RIGHT TOTAL KNEE ARTHROPLASTY;  Surgeon: Thornton Park, MD;  Location: [facility];  Service: Orthopedics;  Laterality: Right;    Current Outpatient Medications  Medication Sig Dispense Refill   ALPRAZolam (XANAX) 0.5 MG tablet Take 1 tablet by mouth twice daily as needed for anxiety 60 tablet 5   diltiazem (CARDIZEM CD) 120 MG 24 hr capsule TAKE 1 CAPSULE BY MOUTH  DAILY 90 capsule  3   ezetimibe (ZETIA) 10 MG tablet TAKE 1 TABLET BY MOUTH  DAILY 90 tablet 3   hyoscyamine (LEVSIN SL) 0.125 MG SL tablet DISSOLVE 1 TABLET UNDER THE TONGUE EVERY 4 HOURS AS NEEDED 30 tablet 0   levothyroxine (SYNTHROID) 112 MCG tablet TAKE 1 TABLET  BY MOUTH  DAILY BEFORE BREAKFAST 90 tablet 3   meclizine (ANTIVERT) 25 MG tablet TAKE 1 TABLET BY MOUTH THREE TIMES DAILY AS NEEDED FOR VERTIGO 30 tablet 0   omeprazole (PRILOSEC) 40 MG capsule TAKE 1 CAPSULE BY MOUTH  TWICE DAILY 180 capsule 3   rosuvastatin (CRESTOR) 20 MG tablet Take 20 mg by mouth every other day.     sertraline (ZOLOFT) 100 MG tablet TAKE 2 TABLETS BY MOUTH  DAILY 180 tablet 3   triamcinolone cream (KENALOG) 0.1 % Apply 1 application topically 2 (two) times daily. 453.6 g 0   amoxicillin-clavulanate (AUGMENTIN) 875-125 MG tablet Take 1 tablet by mouth 2 (two) times daily. (Patient not taking: Reported on 05/14/2021)     furosemide (LASIX) 20 MG tablet TAKE 1 TABLET BY MOUTH  DAILY AS NEEDED FOR  SWELLING/FLUID RETENTION (Patient not taking: Reported on 07/05/2021) 90 tablet 3   potassium chloride (KLOR-CON) 10 MEQ tablet Take 1 tablet (10 mEq total) by mouth daily as needed (with lasix). (Patient not taking: Reported on 07/05/2021) 30 tablet 3   No current facility-administered medications for this visit.   Facility-Administered Medications Ordered in Other Visits  Medication Dose Route Frequency Provider Last Rate Last Admin   clindamycin (CLEOCIN) IVPB 600 mg  600 mg Intravenous Once Thornton Park, MD         Allergies:   Codeine and Fentanyl   Social History:  The patient  reports that she has never smoked. She has never used smokeless tobacco. She reports current alcohol use. She reports that she does not use drugs.   Family History:   family history includes Alcohol abuse in her mother; Colon cancer (age of onset: 39) in her paternal grandmother; Heart attack in her father and mother; Heart disease (age of onset: 36) in her father; Heart disease (age of onset: 67) in her mother; Mental illness in her mother.    Review of Systems: Review of Systems  Constitutional: Negative.   HENT: Negative.    Cardiovascular:  Positive for leg swelling.  Gastrointestinal:  Negative.   Musculoskeletal: Negative.   Neurological: Negative.   Psychiatric/Behavioral: Negative.    All other systems reviewed and are negative.   PHYSICAL EXAM: VS:  BP 130/60 (BP Location: Left Arm, Patient Position: Sitting, Cuff Size: Normal)    Pulse 64    Ht 5\' 5"  (1.651 m)    Wt 141 lb 8 oz (64.2 kg)    SpO2 98%    BMI 23.55 kg/m  , BMI Body mass index is 23.55 kg/m. GEN: Well nourished, well developed, in no acute distress HEENT: normal Neck: no JVD, carotid bruits, or masses Cardiac: RRR; no murmurs, rubs, or gallops, Trace to 1+ pitting lower extremity edema to below the knees Respiratory:  clear to auscultation bilaterally, normal work of breathing GI: soft, nontender, nondistended, + BS MS: no deformity or atrophy Skin: warm and dry, no rash Neuro:  Strength and sensation are intact Psych: euthymic mood, full affect   Recent Labs: 12/13/2020: ALT 19; TSH 5.08 01/29/2021: Hemoglobin 11.2; Platelets 195 05/14/2021: BUN 16; Creatinine, Ser 0.89; Potassium 3.9; Sodium 140    Lipid Panel Lab Results  Component Value  Date   CHOL 191 05/14/2020   HDL 73.70 05/14/2020   LDLCALC 106 (H) 05/14/2020   TRIG 56.0 05/14/2020      Wt Readings from Last 3 Encounters:  07/05/21 141 lb 8 oz (64.2 kg)  05/14/21 141 lb 3.2 oz (64 kg)  03/04/21 136 lb 3.2 oz (61.8 kg)     ASSESSMENT AND PLAN:  Problem List Items Addressed This Visit       Cardiology Problems   Aortic atherosclerosis (HCC) - Primary   Relevant Medications   rosuvastatin (CRESTOR) 20 MG tablet     Other   Bilateral lower extremity edema   Exertional dyspnea   Other Visit Diagnoses     Chronic diastolic heart failure (HCC)       Relevant Medications   rosuvastatin (CRESTOR) 20 MG tablet   Mixed hyperlipidemia       Relevant Medications   rosuvastatin (CRESTOR) 20 MG tablet   Benign essential HTN       Relevant Medications   rosuvastatin (CRESTOR) 20 MG tablet     Pure  hypercholesterolemia Tolerating Zetia daily Prefers to take Crestor 20 every other day  Aortic arch atherosclerosis ([place]) Prior CT scan images no significant coronary calcifications, no calcified plaque noted in the ascending or descending aorta No further work-up   Atypical chest pain Reports symptoms typically well controlled with Levsin Rare symptoms, medication change as below   Diabetes mellitus without complication (HCC) X5Q 6.09 December 2020 Weight up 5 pounds since that time  Esophageal spasm Medication change as below, diltiazem to isosorbide given her concern that calcium channel blocker contributing to lower extremity edema and pain in legs  Leg edema Trace pitting edema noted on exam Normal renal function Recommend she take her Lasix daily as needed, take sparingly perhaps twice a week.  Normal BMP She is requesting referral to vascular for further evaluation, rule out venous reflux, lymphedema Symptoms possibly exacerbated by calcium channel blocker She is interested in coming off the diltiazem if it will help her leg swelling This was previously initiated for esophageal spasm, recommend she try isosorbide 30 mg daily instead She can continue to take her levsin as needed for esophageal symptoms Recommend she call for any intolerance/side effects, also closely monitor blood pressure   Orthostatic hypotension No recent symptoms, medication change as above    Total encounter time more than 40 minutes  Greater than 50% was spent in counseling and coordination of care with the patient    Signed, Esmond Plants, M.D., Ph.D. [place], [place]

## 2021-07-23 ENCOUNTER — Telehealth: Payer: Self-pay | Admitting: Internal Medicine

## 2021-07-23 NOTE — Telephone Encounter (Signed)
Spoke with Megan Bradley and scheduled her for an appt with Dr. Derrel Nip on Tuesday at 10:30 am. Megan Bradley gave a verbal understanding.

## 2021-07-23 NOTE — Telephone Encounter (Signed)
Please call ms Don and schedule her an office visit for next Tuesday morning at 10:30  for multiple issues  30 minutes slot

## 2021-07-30 ENCOUNTER — Encounter: Payer: Self-pay | Admitting: Internal Medicine

## 2021-07-30 ENCOUNTER — Ambulatory Visit (INDEPENDENT_AMBULATORY_CARE_PROVIDER_SITE_OTHER): Payer: Medicare Other | Admitting: Internal Medicine

## 2021-07-30 ENCOUNTER — Other Ambulatory Visit: Payer: Self-pay

## 2021-07-30 ENCOUNTER — Telehealth: Payer: Self-pay | Admitting: Internal Medicine

## 2021-07-30 ENCOUNTER — Ambulatory Visit: Payer: Medicare Other | Admitting: Internal Medicine

## 2021-07-30 VITALS — BP 100/56 | HR 69 | Temp 97.5°F | Ht 65.0 in | Wt 141.2 lb

## 2021-07-30 DIAGNOSIS — D472 Monoclonal gammopathy: Secondary | ICD-10-CM

## 2021-07-30 DIAGNOSIS — K224 Dyskinesia of esophagus: Secondary | ICD-10-CM | POA: Diagnosis not present

## 2021-07-30 DIAGNOSIS — E1140 Type 2 diabetes mellitus with diabetic neuropathy, unspecified: Secondary | ICD-10-CM | POA: Diagnosis not present

## 2021-07-30 DIAGNOSIS — L281 Prurigo nodularis: Secondary | ICD-10-CM

## 2021-07-30 DIAGNOSIS — R2689 Other abnormalities of gait and mobility: Secondary | ICD-10-CM | POA: Diagnosis not present

## 2021-07-30 DIAGNOSIS — N6459 Other signs and symptoms in breast: Secondary | ICD-10-CM | POA: Diagnosis not present

## 2021-07-30 DIAGNOSIS — R944 Abnormal results of kidney function studies: Secondary | ICD-10-CM

## 2021-07-30 DIAGNOSIS — Z862 Personal history of diseases of the blood and blood-forming organs and certain disorders involving the immune mechanism: Secondary | ICD-10-CM

## 2021-07-30 DIAGNOSIS — M25532 Pain in left wrist: Secondary | ICD-10-CM

## 2021-07-30 DIAGNOSIS — E1169 Type 2 diabetes mellitus with other specified complication: Secondary | ICD-10-CM | POA: Diagnosis not present

## 2021-07-30 DIAGNOSIS — G8929 Other chronic pain: Secondary | ICD-10-CM | POA: Insufficient documentation

## 2021-07-30 DIAGNOSIS — E785 Hyperlipidemia, unspecified: Secondary | ICD-10-CM

## 2021-07-30 DIAGNOSIS — D649 Anemia, unspecified: Secondary | ICD-10-CM | POA: Diagnosis not present

## 2021-07-30 LAB — COMPREHENSIVE METABOLIC PANEL
ALT: 17 U/L (ref 0–35)
AST: 21 U/L (ref 0–37)
Albumin: 4 g/dL (ref 3.5–5.2)
Alkaline Phosphatase: 78 U/L (ref 39–117)
BUN: 20 mg/dL (ref 6–23)
CO2: 33 mEq/L — ABNORMAL HIGH (ref 19–32)
Calcium: 9.3 mg/dL (ref 8.4–10.5)
Chloride: 103 mEq/L (ref 96–112)
Creatinine, Ser: 0.96 mg/dL (ref 0.40–1.20)
GFR: 56.46 mL/min — ABNORMAL LOW (ref >60.00)
Glucose, Bld: 99 mg/dL (ref 70–99)
Potassium: 4.3 mEq/L (ref 3.5–5.1)
Sodium: 139 mEq/L (ref 135–145)
Total Bilirubin: 0.8 mg/dL (ref 0.2–1.2)
Total Protein: 6.9 g/dL (ref 6.0–8.3)

## 2021-07-30 LAB — CBC WITH DIFFERENTIAL/PLATELET
Basophils Absolute: 0 10*3/uL (ref 0.0–0.1)
Basophils Relative: 0.6 % (ref 0.0–3.0)
Eosinophils Absolute: 0.1 10*3/uL (ref 0.0–0.7)
Eosinophils Relative: 1.6 % (ref 0.0–5.0)
HCT: 34 % — ABNORMAL LOW (ref 36.0–46.0)
Hemoglobin: 11.1 g/dL — ABNORMAL LOW (ref 12.0–15.0)
Lymphocytes Relative: 18.7 % (ref 12.0–46.0)
Lymphs Abs: 1 10*3/uL (ref 0.7–4.0)
MCHC: 32.6 g/dL (ref 30.0–36.0)
MCV: 89.3 fl (ref 78.0–100.0)
Monocytes Absolute: 0.4 10*3/uL (ref 0.1–1.0)
Monocytes Relative: 7 % (ref 3.0–12.0)
Neutro Abs: 3.9 10*3/uL (ref 1.4–7.7)
Neutrophils Relative %: 72.1 % (ref 43.0–77.0)
Platelets: 131 10*3/uL — ABNORMAL LOW (ref 150.0–400.0)
RBC: 3.81 Mil/uL — ABNORMAL LOW (ref 3.87–5.11)
RDW: 18.8 % — ABNORMAL HIGH (ref 11.5–15.5)
WBC: 5.4 10*3/uL (ref 4.0–10.5)

## 2021-07-30 LAB — MICROALBUMIN / CREATININE URINE RATIO
Creatinine,U: 92.6 mg/dL
Microalb Creat Ratio: 1.1 mg/g (ref 0.0–30.0)
Microalb, Ur: 1 mg/dL (ref 0.0–1.9)

## 2021-07-30 LAB — LIPID PANEL
Cholesterol: 136 mg/dL (ref 0–200)
HDL: 71.9 mg/dL (ref >39.00)
LDL Cholesterol: 55 mg/dL (ref 0–99)
NonHDL: 64.07
Total CHOL/HDL Ratio: 2
Triglycerides: 45 mg/dL (ref 0.0–149.0)
VLDL: 9 mg/dL (ref 0.0–40.0)

## 2021-07-30 LAB — HEMOGLOBIN A1C: Hgb A1c MFr Bld: 6.9 % — ABNORMAL HIGH (ref 4.6–6.5)

## 2021-07-30 NOTE — Assessment & Plan Note (Signed)
Multiple etiologies likely to blame including proximal muscle weakness and BPV

## 2021-07-30 NOTE — Assessment & Plan Note (Addendum)
She has historically been  well-controlled on low GI diet alone and has not history of Microalbuminuria . Patient is tolerating statin therapy for CAD risk reduction.  She has frequent episdes of dizziness with position change resulting in falls,.  Checking B12 and folate levels again today   Lab Results  Component Value Date   HGBA1C 6.3 12/13/2020   Lab Results  Component Value Date   MICROALBUR <0.7 01/16/2021

## 2021-07-30 NOTE — Telephone Encounter (Signed)
Referral not sent waiting on ofc notes.

## 2021-07-30 NOTE — Patient Instructions (Signed)
I have ordered the labs dr Janese Banks requested so you don't have to do any next week before your visit   If your labs are normal,  I will recommend EXERCISE as a remedy for your fatigue (since Dr Rockey Situ has already checked out your heart)

## 2021-07-30 NOTE — Assessment & Plan Note (Signed)
Present for the past 2 moths,  Post fall in late December. She notes pain on the radial side and decreased strength in the right arm/wrist.  Referring to [facility] for evaluation

## 2021-07-30 NOTE — Assessment & Plan Note (Signed)
She has developed subcutaneous nodularities on the left breast post replacement of her collapsed implant in 2021.Marland Kitchen Referring back to Dr Marla Roe

## 2021-07-30 NOTE — Progress Notes (Signed)
Subjective:  Patient ID: Megan Bradley, female    DOB: 09-03-1940  Age: 81 y.o. MRN: [id number]  CC: The primary encounter diagnosis was History of pernicious anemia. Diagnoses of Prurigo nodularis, Hyperlipidemia associated with type 2 diabetes mellitus ([place]), Anemia, unspecified type, MGUS (monoclonal gammopathy of unknown significance), Balance problem, Nutcracker esophagus, Type 2 diabetes mellitus with diabetic neuropathy, without long-term current use of insulin ([place]), Chronic pain of left wrist, and Abnormal breast finding were also pertinent to this visit.   This visit occurred during the SARS-CoV-2 public health emergency.  Safety protocols were in place, including screening questions prior to the visit, additional usage of staff PPE, and extensive cleaning of exam room while observing appropriate contact time as indicated for disinfecting solutions.    HPI Megan Bradley presents for follow up on multiple issues raised in  mychart message Chief Complaint  Patient presents with   Follow-up    1) superficial bumps on left breast,  appeared after implants were replaced 2 years ago  By [name].  Not itchy red ,  no redness   referral to Cd requested  2) history of fall in late December scraped left forehead,  balck eye   and left hip and left wrist took the impact. Since then she has had weakness of the left hand  and now has pain in the radial side of left wrist   wants to see Mack Guise  3)  saw Rockey Situ recently , was told that the medication for nutracker esophagus may be causing the LE edema and recommended a change in medication to Imdur,  swelling has improved.  4) prurigo nodularis diagnosed by Dr [name].   6 weeks ago,  based on appearance.  The swelling has progressed and lower legs are now very abnormal appearing.  Waiting to get approved for a drug assistance program for Dupixent.  Using topical triamcinolone once daily and moisturizingng daily    5) T2DM:  not  compliant with diet lately.  Worried about A1c.  Not checking sugars .  Very fatigued even at rest.  Gets sob bending over.  Also gets dizzy with quick changes in position .  Sleeping fine with 0.5 mg alprazolam  taken nightly.  She is averaging 8 hours of sleep an not groggy in the morning.  Wants b12 level checked.   6)   Lab Results  Component Value Date   [id number] 01/29/2021    Outpatient Medications Prior to Visit  Medication Sig Dispense Refill   ALPRAZolam (XANAX) 0.5 MG tablet Take 1 tablet by mouth twice daily as needed for anxiety 60 tablet 5   ezetimibe (ZETIA) 10 MG tablet TAKE 1 TABLET BY MOUTH  DAILY 90 tablet 3   furosemide (LASIX) 20 MG tablet TAKE 1 TABLET BY MOUTH  DAILY AS NEEDED FOR  SWELLING/FLUID RETENTION 90 tablet 3   hyoscyamine (LEVSIN SL) 0.125 MG SL tablet DISSOLVE 1 TABLET UNDER THE TONGUE EVERY 4 HOURS AS NEEDED 30 tablet 0   isosorbide mononitrate (IMDUR) 30 MG 24 hr tablet Take 1 tablet (30 mg total) by mouth daily. 90 tablet 2   levothyroxine (SYNTHROID) 112 MCG tablet TAKE 1 TABLET BY MOUTH  DAILY BEFORE BREAKFAST 90 tablet 3   meclizine (ANTIVERT) 25 MG tablet TAKE 1 TABLET BY MOUTH THREE TIMES DAILY AS NEEDED FOR VERTIGO 30 tablet 0   omeprazole (PRILOSEC) 40 MG capsule TAKE 1 CAPSULE BY MOUTH  TWICE DAILY 180 capsule 3   potassium  chloride (KLOR-CON) 10 MEQ tablet Take 1 tablet (10 mEq total) by mouth daily as needed (with lasix). 30 tablet 3   rosuvastatin (CRESTOR) 20 MG tablet Take 20 mg by mouth every other day.     sertraline (ZOLOFT) 100 MG tablet TAKE 2 TABLETS BY MOUTH  DAILY 180 tablet 3   triamcinolone cream (KENALOG) 0.1 % Apply 1 application topically 2 (two) times daily. 453.6 g 0   amoxicillin-clavulanate (AUGMENTIN) 875-125 MG tablet Take 1 tablet by mouth 2 (two) times daily. (Patient not taking: Reported on 05/14/2021)     Facility-Administered Medications Prior to Visit  Medication Dose Route Frequency Provider Last Rate Last Admin    clindamycin (CLEOCIN) IVPB 600 mg  600 mg Intravenous Once Thornton Park, MD        Review of Systems;  Patient denies headache, fevers, malaise, unintentional weight loss, skin rash, eye pain, sinus congestion and sinus pain, sore throat, dysphagia,  hemoptysis , cough, dyspnea, wheezing, chest pain, palpitations, orthopnea, edema, abdominal pain, nausea, melena, diarrhea, constipation, flank pain, dysuria, hematuria, urinary  Frequency, nocturia, numbness, tingling, seizures,  Focal weakness, Loss of consciousness,  Tremor, insomnia, depression, anxiety, and suicidal ideation.      Objective:  BP (!) 100/56 (BP Location: Left Arm, Patient Position: Sitting, Cuff Size: Normal)    Pulse 69    Temp (!) 97.5 F (36.4 C) (Oral)    Ht 5' 5" (1.651 m)    Wt 141 lb 3.2 oz (64 kg)    SpO2 96%    BMI 23.50 kg/m   BP Readings from Last 3 Encounters:  07/30/21 (!) 100/56  07/05/21 130/60  05/14/21 126/60    Wt Readings from Last 3 Encounters:  07/30/21 141 lb 3.2 oz (64 kg)  07/05/21 141 lb 8 oz (64.2 kg)  05/14/21 141 lb 3.2 oz (64 kg)    General appearance: alert, cooperative and appears stated age Ears: normal TM's and external ear canals both ears Throat: lips, mucosa, and tongue normal; teeth and gums normal Neck: no adenopathy, no carotid bruit, supple, symmetrical, trachea midline and thyroid not enlarged, symmetric, no tenderness/mass/nodules Back: symmetric, no curvature. ROM normal. No CVA tenderness. Lungs: clear to auscultation bilaterally Heart: regular rate and rhythm, S1, S2 normal, no murmur, click, rub or gallop Abdomen: soft, non-tender; bowel sounds normal; no masses,  no organomegaly Pulses: 2+ and symmetric Skin: Skin color, texture, turgor normal. No rashes or lesions Lymph nodes: Cervical, supraclavicular, and axillary nodes normal.  Lab Results  Component Value Date   HGBA1C 6.3 12/13/2020   HGBA1C 6.9 (H) 09/26/2020   HGBA1C 6.8 (H) 05/14/2020    Lab  Results  Component Value Date   CREATININE 0.89 05/14/2021   CREATININE 0.86 01/16/2021   CREATININE 0.91 12/13/2020    Lab Results  Component Value Date   WBC 8.0 01/29/2021   HGB 11.2 (L) 01/29/2021   HCT 34.6 (L) 01/29/2021   PLT 195 01/29/2021   GLUCOSE 69 (L) 05/14/2021   CHOL 191 05/14/2020   TRIG 56.0 05/14/2020   HDL 73.70 05/14/2020   LDLDIRECT 93.0 01/02/2016   LDLCALC 106 (H) 05/14/2020   ALT 19 12/13/2020   AST 20 12/13/2020   NA 140 05/14/2021   K 3.9 05/14/2021   CL 102 05/14/2021   CREATININE 0.89 05/14/2021   BUN 16 05/14/2021   CO2 30 05/14/2021   TSH 5.08 12/13/2020   INR 1.0 09/26/2020   HGBA1C 6.3 12/13/2020   MICROALBUR <0.7 01/16/2021  US BREAST LTD UNI RIGHT INC AXILLA  Result Date: 03/14/2021 CLINICAL DATA:  81 year old female presenting for evaluation of a palpable lump in the right breast. The patient has bilateral retroglandular implants which were replaced 3 years ago following rupture. Her initial implants were placed in 1974. EXAM: DIGITAL DIAGNOSTIC BILATERAL MAMMOGRAM WITH IMPLANTS, CAD AND TOMOSYNTHESIS; ULTRASOUND RIGHT BREAST LIMITED TECHNIQUE: Bilateral digital diagnostic mammography and breast tomosynthesis was performed. The images were evaluated with computer-aided detection. Standard and/or implant displaced views were performed.; Targeted ultrasound examination of the right breast was performed COMPARISON:  Previous exam(s). ACR Breast Density Category b: There are scattered areas of fibroglandular density. FINDINGS: A BB indicating the palpable site of concern has been placed along the superior aspect of the right breast. No suspicious findings are seen deep to the palpable marker within the breast tissue. There is a focal folding of the implant at this site. No suspicious calcifications, masses or areas of distortion are seen in the bilateral breasts. The patient has retroglandular implants. Ultrasound targeted to the palpable site right  breast at 12 o'clock, approximately 10 cm from the nipple demonstrates a focal folding of the implant which protrudes close to the skin surface. There is no snowstorm pattern to suggest extracapsular silicone rupture. No suspicious findings are found within the breast parenchyma. IMPRESSION: 1. The palpable lump in the right breast corresponds with a focal fold within the patient's breast implant. 2.  No mammographic evidence of malignancy in the bilateral breasts. RECOMMENDATION: Screening mammogram in one year.(Code:SM-B-01Y) I have discussed the findings and recommendations with the patient. If applicable, a reminder letter will be sent to the patient regarding the next appointment. BI-RADS CATEGORY  1: Negative. Electronically Signed   By: Ammie Ferrier M.D.   On: 03/14/2021 16:06  MM DIAG BREAST W/IMPLANT TOMO BILATERAL  Result Date: 03/14/2021 CLINICAL DATA:  81 year old female presenting for evaluation of a palpable lump in the right breast. The patient has bilateral retroglandular implants which were replaced 3 years ago following rupture. Her initial implants were placed in 1974. EXAM: DIGITAL DIAGNOSTIC BILATERAL MAMMOGRAM WITH IMPLANTS, CAD AND TOMOSYNTHESIS; ULTRASOUND RIGHT BREAST LIMITED TECHNIQUE: Bilateral digital diagnostic mammography and breast tomosynthesis was performed. The images were evaluated with computer-aided detection. Standard and/or implant displaced views were performed.; Targeted ultrasound examination of the right breast was performed COMPARISON:  Previous exam(s). ACR Breast Density Category b: There are scattered areas of fibroglandular density. FINDINGS: A BB indicating the palpable site of concern has been placed along the superior aspect of the right breast. No suspicious findings are seen deep to the palpable marker within the breast tissue. There is a focal folding of the implant at this site. No suspicious calcifications, masses or areas of distortion are seen in the  bilateral breasts. The patient has retroglandular implants. Ultrasound targeted to the palpable site right breast at 12 o'clock, approximately 10 cm from the nipple demonstrates a focal folding of the implant which protrudes close to the skin surface. There is no snowstorm pattern to suggest extracapsular silicone rupture. No suspicious findings are found within the breast parenchyma. IMPRESSION: 1. The palpable lump in the right breast corresponds with a focal fold within the patient's breast implant. 2.  No mammographic evidence of malignancy in the bilateral breasts. RECOMMENDATION: Screening mammogram in one year.(Code:SM-B-01Y) I have discussed the findings and recommendations with the patient. If applicable, a reminder letter will be sent to the patient regarding the next appointment. BI-RADS CATEGORY  1: Negative. Electronically Signed  By: Ammie Ferrier M.D.   On: 03/14/2021 16:06    Assessment & Plan:   Problem List Items Addressed This Visit     Abnormal breast finding    She has developed subcutaneous nodularities on the left breast post replacement of her collapsed implant in 2021.Marland Kitchen Referring back to Dr Marla Roe       Relevant Orders   Ambulatory referral to Plastic Surgery   Anemia   Balance problem    Multiple etiologies likely to blame including proximal muscle weakness and BPV       Chronic pain of left wrist    Present for the past 2 moths,  Post fall in late December. She notes pain on the radial side and decreased strength in the right arm/wrist.  Referring to [facility] for evaluation      Relevant Orders   Ambulatory referral to Orthopedic Surgery   History of pernicious anemia - Primary   Relevant Orders   Vitamin B12   RBC Folate   Intrinsic Factor Antibodies   Hyperlipidemia associated with type 2 diabetes mellitus ([place])   Relevant Orders   Hemoglobin A1c   Comprehensive metabolic panel   Urine Microalbumin w/creat. ratio   Lipid panel   MGUS  (monoclonal gammopathy of unknown significance)   Relevant Orders   Multiple Myeloma Panel (SPEP&IFE w/QIG)   CBC with Differential/Platelet   Nutcracker esophagus    Now managed with Imdur instead of Cardizem due to persistent LE edema aggravating her dermatologic condition.  She has had no recurrent symptoms since switching meds (done by Dr Rockey Situ)      Prurigo nodularis   Type 2 diabetes mellitus ([place])    She has historically been  well-controlled on low GI diet alone and has not history of Microalbuminuria . Patient is tolerating statin therapy for CAD risk reduction.  She has frequent episdes of dizziness with position change resulting in falls,.  Checking B12 and folate levels again today   Lab Results  Component Value Date   HGBA1C 6.3 12/13/2020   Lab Results  Component Value Date   MICROALBUR <0.7 01/16/2021          I spent 30 minutes dedicated to the care of this patient on the date of this encounter to include pre-visit review of patient's medical history,  most recent imaging studies, Face-to-face time with the patient , and post visit ordering of testing and therapeutics.    Follow-up: No follow-ups on file.   Crecencio Mc, MD

## 2021-07-30 NOTE — Assessment & Plan Note (Signed)
Now managed with Imdur instead of Cardizem due to persistent LE edema aggravating her dermatologic condition.  She has had no recurrent symptoms since switching meds (done by Dr Rockey Situ)

## 2021-08-01 LAB — MULTIPLE MYELOMA PANEL, SERUM
Albumin SerPl Elph-Mcnc: 3.6 g/dL (ref 2.9–4.4)
Albumin/Glob SerPl: 1.1 (ref 0.7–1.7)
Alpha 1: 0.3 g/dL (ref 0.0–0.4)
Alpha2 Glob SerPl Elph-Mcnc: 0.6 g/dL (ref 0.4–1.0)
B-Globulin SerPl Elph-Mcnc: 1.9 g/dL — ABNORMAL HIGH (ref 0.7–1.3)
Gamma Glob SerPl Elph-Mcnc: 0.5 g/dL (ref 0.4–1.8)
Globulin, Total: 3.3 g/dL (ref 2.2–3.9)
IgA/Immunoglobulin A, Serum: 197 mg/dL (ref 64–422)
IgG (Immunoglobin G), Serum: 1429 mg/dL (ref 586–1602)
IgM (Immunoglobulin M), Srm: 58 mg/dL (ref 26–217)
M Protein SerPl Elph-Mcnc: 1 g/dL — ABNORMAL HIGH
Total Protein: 6.9 g/dL (ref 6.0–8.5)

## 2021-08-01 LAB — FOLATE RBC: RBC Folate: 622 ng/mL RBC (ref >280)

## 2021-08-01 NOTE — Addendum Note (Signed)
Addended by: Crecencio Mc on: 08/01/2021 03:30 PM   Modules accepted: Orders

## 2021-08-02 DIAGNOSIS — M654 Radial styloid tenosynovitis [de Quervain]: Secondary | ICD-10-CM | POA: Diagnosis not present

## 2021-08-09 DIAGNOSIS — Z961 Presence of intraocular lens: Secondary | ICD-10-CM | POA: Diagnosis not present

## 2021-08-09 DIAGNOSIS — H04123 Dry eye syndrome of bilateral lacrimal glands: Secondary | ICD-10-CM | POA: Diagnosis not present

## 2021-08-15 ENCOUNTER — Other Ambulatory Visit: Payer: Self-pay | Admitting: Internal Medicine

## 2021-08-19 ENCOUNTER — Other Ambulatory Visit: Payer: Self-pay

## 2021-08-19 MED ORDER — ISOSORBIDE MONONITRATE ER 30 MG PO TB24: 30.0000 mg | ORAL_TABLET | Freq: Every day | ORAL | 0 refills | Status: DC

## 2021-08-19 MED ORDER — HYOSCYAMINE SULFATE 0.125 MG SL SUBL: 0.1250 mg | SUBLINGUAL_TABLET | SUBLINGUAL | 0 refills | Status: DC | PRN

## 2021-08-19 MED ORDER — ROSUVASTATIN CALCIUM 20 MG PO TABS: 20.0000 mg | ORAL_TABLET | ORAL | 1 refills | Status: DC

## 2021-08-20 ENCOUNTER — Other Ambulatory Visit: Payer: Self-pay

## 2021-08-20 ENCOUNTER — Telehealth: Payer: Self-pay

## 2021-08-20 ENCOUNTER — Telehealth: Payer: Self-pay | Admitting: Internal Medicine

## 2021-08-20 MED ORDER — ACCU-CHEK GUIDE VI STRP: ORAL_STRIP | 3 refills | Status: DC

## 2021-08-20 NOTE — Telephone Encounter (Signed)
Error

## 2021-08-20 NOTE — Telephone Encounter (Signed)
Test strips have been sent to pharmacy ?

## 2021-08-20 NOTE — Telephone Encounter (Signed)
Pt called in requesting for new script/approval for medication T/S Accu Check Guide Me Test Strips. Pt stated that she would like to have them sent to [facility]. Pt stated that she tried to order the strip but [facility] is advising that pt called her provider to get approval. Pt is requesting callback.  ?

## 2021-08-21 DIAGNOSIS — L281 Prurigo nodularis: Secondary | ICD-10-CM | POA: Diagnosis not present

## 2021-08-21 DIAGNOSIS — L2089 Other atopic dermatitis: Secondary | ICD-10-CM | POA: Diagnosis not present

## 2021-08-26 ENCOUNTER — Other Ambulatory Visit: Payer: Self-pay

## 2021-08-26 ENCOUNTER — Ambulatory Visit (INDEPENDENT_AMBULATORY_CARE_PROVIDER_SITE_OTHER): Payer: Medicare Other | Admitting: Nurse Practitioner

## 2021-08-26 ENCOUNTER — Encounter (INDEPENDENT_AMBULATORY_CARE_PROVIDER_SITE_OTHER): Payer: Self-pay | Admitting: Nurse Practitioner

## 2021-08-26 VITALS — BP 125/60 | HR 70 | Resp 16 | Wt 138.8 lb

## 2021-08-26 DIAGNOSIS — E119 Type 2 diabetes mellitus without complications: Secondary | ICD-10-CM

## 2021-08-26 DIAGNOSIS — E785 Hyperlipidemia, unspecified: Secondary | ICD-10-CM | POA: Diagnosis not present

## 2021-08-26 DIAGNOSIS — M7989 Other specified soft tissue disorders: Secondary | ICD-10-CM | POA: Diagnosis not present

## 2021-08-27 ENCOUNTER — Encounter (INDEPENDENT_AMBULATORY_CARE_PROVIDER_SITE_OTHER): Payer: Self-pay | Admitting: Nurse Practitioner

## 2021-08-27 NOTE — Progress Notes (Signed)
? ?Subjective:  ? ? Patient ID: Megan Bradley, female    DOB: 22-Feb-1941, 81 y.o.   MRN: [id number] ?Chief Complaint  ?Patient presents with  ? Follow-up  ?  Ref [place]o for ble edema,lymphedema  ? ? ?Megan Bradley is a 81 year old female that presents today for evaluation of lower extremity edema.  We previously saw the patient in 2022 prior to her knee replacement surgery.  At that time she was having issues with edema.  She underwent knee replacement and has worked with physical therapy without issues.  It was noted that some of her edema may be related to medication issues.  These medications were adjusted and since that time she has not had any issues with swelling whatsoever.  She denies any issues with her knee swelling either.  She denies any pain or open ulcerations.  She does have some dermatological issues she has been evaluated before by dermatology for. ? ? ?Review of Systems  ?Cardiovascular:  Negative for leg swelling.  ?All other systems reviewed and are negative. ? ?   ?Objective:  ? Physical Exam ?Vitals reviewed.  ?HENT:  ?   Head: Normocephalic.  ?Cardiovascular:  ?   Rate and Rhythm: Normal rate.  ?   Pulses: Normal pulses.  ?Pulmonary:  ?   Effort: Pulmonary effort is normal.  ?Skin: ?   General: Skin is warm and dry.  ?   Findings: Erythema and rash present.  ?Neurological:  ?   Mental Status: She is alert and oriented to person, place, and time.  ?Psychiatric:     ?   Mood and Affect: Mood normal.     ?   Behavior: Behavior normal.     ?   Thought Content: Thought content normal.     ?   Judgment: Judgment normal.  ? ? ?BP 125/60 (BP Location: Left Arm)   Pulse 70   Resp 16   Wt 138 lb 12.8 oz (63 kg)   BMI 23.10 kg/m?  ? ?Past Medical History:  ?Diagnosis Date  ? Anxiety   ? Anxiety disorder   ? Aortic atherosclerosis ([place])   ? Arthritis   ? both knees  ? Breast disorder in female 10/14/2019  ? Cervical dysplasia   ? a. 2010 - high grade squamous intraepithelial lesion s/p excision.  ? Chest pain    ? a. 2006 or 2007 [facility]): reportedly nl cors;  b. 09/2011 ETT: nl.  ? DOE (dyspnea on exertion)   ? Family history of adverse reaction to anesthesia   ? grand daughter was hard to wake up after back surgery  ? GERD (gastroesophageal reflux disease)   ? Grade II diastolic dysfunction   ? Herpes zoster   ? Hypothyroidism   ? Mild aortic regurgitation   ? Nutcracker esophagus   ? Orthostatic hypotension   ? Pneumonia   ? Pre-diabetes   ? Ruptured left breast implant, initial encounter 07/05/2018  ? Sleep apnea   ? does not use cpap, did not tolerate  ? Vertigo   ? Wears dentures   ? partial lower  ? ? ?Social History  ? ?Socioeconomic History  ? Marital status: Married  ?  Spouse name: [name]  ? Number of children: Not on file  ? Years of education: Not on file  ? Highest education level: Not on file  ?Occupational History  ? Not on file  ?Tobacco Use  ? Smoking status: Never  ? Smokeless tobacco: Never  ?Vaping Use  ?  Vaping Use: Never used  ?Substance and Sexual Activity  ? Alcohol use: Yes  ?  Comment: wine once a week  ? Drug use: No  ? Sexual activity: Never  ?Other Topics Concern  ? Not on file  ?Social History Narrative  ? Lives in [place].  Active but doesn't exercise. Retired from [employer].  ? ?Social Determinants of Health  ? ?Financial Resource Strain: Not on file  ?Food Insecurity: Not on file  ?Transportation Needs: Not on file  ?Physical Activity: Not on file  ?Stress: Not on file  ?Social Connections: Not on file  ?Intimate Partner Violence: Not on file  ? ? ?Past Surgical History:  ?Procedure Laterality Date  ? AUGMENTATION MAMMAPLASTY Bilateral   ? BREAST EXCISIONAL BIOPSY Left   ? CARDIAC CATHETERIZATION  2003  ? Dr.Callwood, R/L heart cath,  ? COLONOSCOPY WITH PROPOFOL N/A 06/12/2017  ? Procedure: COLONOSCOPY WITH PROPOFOL;  Surgeon: Lucilla Lame, MD;  Location: [place];  Service: Endoscopy;  Laterality: N/A;  ? dental work    ? ESOPHAGEAL MANOMETRY N/A 06/19/2016  ? Procedure:  ESOPHAGEAL MANOMETRY (EM);  Surgeon: Lucilla Lame, MD;  Location: [facility];  Service: Endoscopy;  Laterality: N/A;  ? ESOPHAGOGASTRODUODENOSCOPY (EGD) WITH PROPOFOL N/A 04/03/2017  ? Procedure: ESOPHAGOGASTRODUODENOSCOPY (EGD) WITH PROPOFOL;  Surgeon: Lucilla Lame, MD;  Location: [place];  Service: Endoscopy;  Laterality: N/A;  diabetic - diet controlled  ? THYROIDECTOMY  05/15/15  ? [facility]  ? TONSILLECTOMY    ? TOTAL KNEE ARTHROPLASTY Right 10/25/2020  ? Procedure: RIGHT TOTAL KNEE ARTHROPLASTY;  Surgeon: Thornton Park, MD;  Location: [facility];  Service: Orthopedics;  Laterality: Right;  ? ? ?Family History  ?Problem Relation Age of Onset  ? Heart attack Mother   ? Alcohol abuse Mother   ? Mental illness Mother   ?     died in her 47's of alzheimer's Dementia  ? Heart disease Mother 70  ? Heart attack Father   ? Heart disease Father 60  ?     AMI - died @ 17.  ? Colon cancer Paternal Grandmother 60  ? ? ?Allergies  ?Allergen Reactions  ? Codeine Nausea And Vomiting  ? Fentanyl Nausea And Vomiting  ? ? ?CBC Latest Ref Rng & Units 07/30/2021 01/29/2021 01/07/2021  ?WBC 4.0 - 10.5 K/uL 5.4 8.0 6.6  ?Hemoglobin 12.0 - 15.0 g/dL 11.1(L) 11.2(L) 10.4(L)  ?Hematocrit 36.0 - 46.0 % 34.0(L) 34.6(L) 32.5(L)  ?Platelets 150.0 - 400.0 K/uL 131.0(L) 195 172.0  ? ? ? ? ?CMP  ?   ?Component Value Date/Time  ? NA 139 07/30/2021 1105  ? NA 140 10/22/2013 1330  ? K 4.3 07/30/2021 1105  ? K 3.8 10/22/2013 1330  ? CL 103 07/30/2021 1105  ? CL 106 10/22/2013 1330  ? CO2 33 (H) 07/30/2021 1105  ? CO2 29 10/22/2013 1330  ? GLUCOSE 99 07/30/2021 1105  ? GLUCOSE 107 (H) 10/22/2013 1330  ? BUN 20 07/30/2021 1105  ? BUN 20 (H) 10/22/2013 1330  ? CREATININE 0.96 07/30/2021 1105  ? CREATININE 1.00 (H) 03/02/2015 1610  ? CALCIUM 9.3 07/30/2021 1105  ? CALCIUM 8.6 10/22/2013 1330  ? PROT 6.9 07/30/2021 1105  ? PROT 6.9 07/30/2021 1105  ? PROT 6.6 10/22/2013 1330  ? ALBUMIN 4.0 07/30/2021 1105  ? ALBUMIN 3.3 (L) 10/22/2013 1330  ? AST  21 07/30/2021 1105  ? AST 21 10/22/2013 1330  ? ALT 17 07/30/2021 1105  ? ALT 22 10/22/2013 1330  ?  ALKPHOS 78 07/30/2021 1105  ? ALKPHOS 56 10/22/2013 1330  ? BILITOT 0.8 07/30/2021 1105  ? BILITOT 0.4 10/22/2013 1330  ? GFRNONAA >60 10/26/2020 0435  ? GFRNONAA 55 (L) 10/22/2013 1330  ? GFRAA >60 03/24/2017 1328  ? GFRAA >60 10/22/2013 1330  ? ? ? ?No results found. ? ?   ?Assessment & Plan:  ? ?1. Leg swelling ?Following the recent medication changes the patient does not have any evidence of swelling and she denies having any issues for some time.  She has some ongoing dermatological issues that she is working through with dermatology and compression socks may be difficult to wear during this time.  The patient is also urged to continue to remain active.  She will follow-up with [place] if her swelling becomes an issue again for further evaluation.  She should have bilateral venous reflux to evaluate for reflux issues if she has worsening edema. ? ?2. Hyperlipidemia, unspecified hyperlipidemia type ?Continue statin as ordered and reviewed, no changes at this time  ? ?3. Diabetes mellitus without complication ([place]) ?Continue hypoglycemic medications as already ordered, these medications have been reviewed and there are no changes at this time. ? ?Hgb A1C to be monitored as already arranged by primary service  ? ? ?Current Outpatient Medications on File Prior to Visit  ?Medication Sig Dispense Refill  ? ALPRAZolam (XANAX) 0.5 MG tablet Take 1 tablet by mouth twice daily as needed for anxiety 60 tablet 5  ? ezetimibe (ZETIA) 10 MG tablet TAKE 1 TABLET BY MOUTH  DAILY 90 tablet 3  ? furosemide (LASIX) 20 MG tablet TAKE 1 TABLET BY MOUTH  DAILY AS NEEDED FOR  SWELLING/FLUID RETENTION 90 tablet 3  ? glucose blood (ACCU-CHEK GUIDE) test strip Use to check blood sugars once daily. Dx Code: E11.9 100 each 3  ? hyoscyamine (LEVSIN SL) 0.125 MG SL tablet Take 1 tablet (0.125 mg total) by mouth every 4 (four) hours as needed. 30  tablet 0  ? isosorbide mononitrate (IMDUR) 30 MG 24 hr tablet Take 1 tablet (30 mg total) by mouth daily. 90 tablet 0  ? levothyroxine (SYNTHROID) 112 MCG tablet TAKE 1 TABLET BY MOUTH  DAILY BEFORE BREAKFAST 90

## 2021-09-03 ENCOUNTER — Inpatient Hospital Stay: Payer: Medicare Other | Attending: Oncology | Admitting: Oncology

## 2021-09-03 ENCOUNTER — Encounter: Payer: Self-pay | Admitting: Oncology

## 2021-09-03 ENCOUNTER — Other Ambulatory Visit: Payer: Self-pay

## 2021-09-03 ENCOUNTER — Ambulatory Visit: Payer: Medicare Other | Admitting: Plastic Surgery

## 2021-09-03 VITALS — BP 105/60 | HR 61 | Temp 97.1°F | Resp 16 | Wt 141.4 lb

## 2021-09-03 DIAGNOSIS — E1140 Type 2 diabetes mellitus with diabetic neuropathy, unspecified: Secondary | ICD-10-CM | POA: Diagnosis not present

## 2021-09-03 DIAGNOSIS — E119 Type 2 diabetes mellitus without complications: Secondary | ICD-10-CM | POA: Insufficient documentation

## 2021-09-03 DIAGNOSIS — D472 Monoclonal gammopathy: Secondary | ICD-10-CM | POA: Diagnosis not present

## 2021-09-03 DIAGNOSIS — Z9882 Breast implant status: Secondary | ICD-10-CM

## 2021-09-03 DIAGNOSIS — E039 Hypothyroidism, unspecified: Secondary | ICD-10-CM | POA: Insufficient documentation

## 2021-09-03 NOTE — Progress Notes (Signed)
Pt states that she has a skin condition with bumps all over her legs; allergic to medication she was prescribed and is currentlywaiting on dupixent injection approval.  ? ?

## 2021-09-03 NOTE — Progress Notes (Signed)
? ? ? ?Hematology/Oncology Consult note ?[facility]  ?[phone] [phone] ? ?Patient Care Team: ?Crecencio Mc, MD as PCP - General (Internal Medicine) ?Minna Merritts, MD as PCP - Cardiology (Cardiology)  ? ?Name of the patient: Megan Bradley  ?[id number]  ?12-Oct-1940  ? ?Date of visit: 09/03/21 ? ?Diagnosis-IgG kappa MGUS ? ?Chief complaint/ Reason for visit-routine follow-up of MGUS ? ?Heme/Onc history: Patient is a 81 year old female with past medical history significant for type 2 diabetes hyperlipidemia hypothyroidism who has been referred for biclonal gammopathy and concern for pernicious anemia.  Her recent CBC from 01/07/2021 showed white count of 6.6, H&H of 10.4/32.5 with a platelet count of 172.  B12 level was normal at 396 in July 2022.  Iron studies in July showed an elevated ferritin of 344 with an iron saturation of 17%.  Urine protein electrophoresis showed Bence-Jones protein positive IgG kappa serum protein electrophoresis showed 2 possible abnormal protein bands that may represent monoclonal immunoglobulin light chains.  Intrinsic factor antibody was negative a year ago and positive in August 2022.  Looking back at her prior labs patient's hemoglobin was around 12 until December 2021.  It was 11.4 in April 2022 and then went down to 8.9 in May 2022 when she was admitted for right total knee replacement.  Following that her hemoglobin went up to 11.3 in July 2022 ?  ?Results of blood work from 01/29/2021 were as follows: CBC showed white count of 8, H&H of 11.2/34.6 with an MCV of 91 and a platelet count of 195.  Myeloma panel showed 0.9 g of IgG kappa M protein.  Serum kappa free light chain mildly elevated at 25 with an normal light chain ratio 1.5.  Haptoglobin normal at 146.  B12 levels normal at 475.  LDH normal at 135. ?  ? ?Interval history-patient is doing well overall.She does report skin rash especially over her legs which was attributed to  allergy to her medication and she is currently awaiting Dupixent injection approval for the same. ? ?ECOG PS- 1 ?Pain scale- 0 ? ? ?Review of systems- Review of Systems  ?Constitutional:  Negative for chills, fever, malaise/fatigue and weight loss.  ?HENT:  Negative for congestion, ear discharge and nosebleeds.   ?Eyes:  Negative for blurred vision.  ?Respiratory:  Negative for cough, hemoptysis, sputum production, shortness of breath and wheezing.   ?Cardiovascular:  Negative for chest pain, palpitations, orthopnea and claudication.  ?Gastrointestinal:  Negative for abdominal pain, blood in stool, constipation, diarrhea, heartburn, melena, nausea and vomiting.  ?Genitourinary:  Negative for dysuria, flank pain, frequency, hematuria and urgency.  ?Musculoskeletal:  Negative for back pain, joint pain and myalgias.  ?Skin:  Negative for rash.  ?Neurological:  Negative for dizziness, tingling, focal weakness, seizures, weakness and headaches.  ?Endo/Heme/Allergies:  Does not bruise/bleed easily.  ?Psychiatric/Behavioral:  Negative for depression and suicidal ideas. The patient does not have insomnia.    ? ? ?Allergies  ?Allergen Reactions  ? Codeine Nausea And Vomiting  ? Fentanyl Nausea And Vomiting  ? ? ? ?Past Medical History:  ?Diagnosis Date  ? Anxiety   ? Anxiety disorder   ? Aortic atherosclerosis ([place])   ? Arthritis   ? both knees  ? Breast disorder in female 10/14/2019  ? Cervical dysplasia   ? a. 2010 - high grade squamous intraepithelial lesion s/p excision.  ? Chest pain   ? a. 2006 or 2007 [facility]): reportedly nl cors;  b. 09/2011 ETT:  nl.  ? DOE (dyspnea on exertion)   ? Family history of adverse reaction to anesthesia   ? grand daughter was hard to wake up after back surgery  ? GERD (gastroesophageal reflux disease)   ? Grade II diastolic dysfunction   ? Herpes zoster   ? Hypothyroidism   ? Mild aortic regurgitation   ? Nutcracker esophagus   ? Orthostatic hypotension   ? Pneumonia   ? Pre-diabetes    ? Ruptured left breast implant, initial encounter 07/05/2018  ? Sleep apnea   ? does not use cpap, did not tolerate  ? Vertigo   ? Wears dentures   ? partial lower  ? ? ? ?Past Surgical History:  ?Procedure Laterality Date  ? AUGMENTATION MAMMAPLASTY Bilateral   ? BREAST EXCISIONAL BIOPSY Left   ? CARDIAC CATHETERIZATION  2003  ? Dr.Callwood, R/L heart cath,  ? COLONOSCOPY WITH PROPOFOL N/A 06/12/2017  ? Procedure: COLONOSCOPY WITH PROPOFOL;  Surgeon: Lucilla Lame, MD;  Location: [place];  Service: Endoscopy;  Laterality: N/A;  ? dental work    ? ESOPHAGEAL MANOMETRY N/A 06/19/2016  ? Procedure: ESOPHAGEAL MANOMETRY (EM);  Surgeon: Lucilla Lame, MD;  Location: [facility];  Service: Endoscopy;  Laterality: N/A;  ? ESOPHAGOGASTRODUODENOSCOPY (EGD) WITH PROPOFOL N/A 04/03/2017  ? Procedure: ESOPHAGOGASTRODUODENOSCOPY (EGD) WITH PROPOFOL;  Surgeon: Lucilla Lame, MD;  Location: [place];  Service: Endoscopy;  Laterality: N/A;  diabetic - diet controlled  ? THYROIDECTOMY  05/15/15  ? [facility]  ? TONSILLECTOMY    ? TOTAL KNEE ARTHROPLASTY Right 10/25/2020  ? Procedure: RIGHT TOTAL KNEE ARTHROPLASTY;  Surgeon: Thornton Park, MD;  Location: [facility];  Service: Orthopedics;  Laterality: Right;  ? ? ?Social History  ? ?Socioeconomic History  ? Marital status: Married  ?  Spouse name: [name]  ? Number of children: Not on file  ? Years of education: Not on file  ? Highest education level: Not on file  ?Occupational History  ? Not on file  ?Tobacco Use  ? Smoking status: Never  ? Smokeless tobacco: Never  ?Vaping Use  ? Vaping Use: Never used  ?Substance and Sexual Activity  ? Alcohol use: Yes  ?  Comment: wine once a week  ? Drug use: No  ? Sexual activity: Never  ?Other Topics Concern  ? Not on file  ?Social History Narrative  ? Lives in [place].  Active but doesn't exercise. Retired from [employer].  ? ?Social Determinants of Health  ? ?Financial Resource Strain: Not on file  ?Food Insecurity: Not on file   ?Transportation Needs: Not on file  ?Physical Activity: Not on file  ?Stress: Not on file  ?Social Connections: Not on file  ?Intimate Partner Violence: Not on file  ? ? ?Family History  ?Problem Relation Age of Onset  ? Heart attack Mother   ? Alcohol abuse Mother   ? Mental illness Mother   ?     died in her 90's of alzheimer's Dementia  ? Heart disease Mother 16  ? Heart attack Father   ? Heart disease Father 22  ?     AMI - died @ 14.  ? Colon cancer Paternal Grandmother 59  ? ? ? ?Current Outpatient Medications:  ?  ALPRAZolam (XANAX) 0.5 MG tablet, Take 1 tablet by mouth twice daily as needed for anxiety, Disp: 60 tablet, Rfl: 5 ?  ezetimibe (ZETIA) 10 MG tablet, TAKE 1 TABLET BY MOUTH  DAILY, Disp: 90 tablet, Rfl: 3 ?  furosemide (LASIX) 20 MG tablet, TAKE 1 TABLET BY MOUTH  DAILY AS NEEDED FOR  SWELLING/FLUID RETENTION, Disp: 90 tablet, Rfl: 3 ?  glucose blood (ACCU-CHEK GUIDE) test strip, Use to check blood sugars once daily. Dx Code: E11.9, Disp: 100 each, Rfl: 3 ?  hyoscyamine (LEVSIN SL) 0.125 MG SL tablet, Take 1 tablet (0.125 mg total) by mouth every 4 (four) hours as needed., Disp: 30 tablet, Rfl: 0 ?  isosorbide mononitrate (IMDUR) 30 MG 24 hr tablet, Take 1 tablet (30 mg total) by mouth daily., Disp: 90 tablet, Rfl: 0 ?  levothyroxine (SYNTHROID) 112 MCG tablet, TAKE 1 TABLET BY MOUTH  DAILY BEFORE BREAKFAST, Disp: 90 tablet, Rfl: 3 ?  meclizine (ANTIVERT) 25 MG tablet, TAKE 1 TABLET BY MOUTH THREE TIMES DAILY AS NEEDED FOR VERTIGO, Disp: 30 tablet, Rfl: 0 ?  omeprazole (PRILOSEC) 40 MG capsule, TAKE 1 CAPSULE BY MOUTH  TWICE DAILY, Disp: 180 capsule, Rfl: 3 ?  potassium chloride (KLOR-CON) 10 MEQ tablet, Take 1 tablet (10 mEq total) by mouth daily as needed (with lasix)., Disp: 30 tablet, Rfl: 3 ?  rosuvastatin (CRESTOR) 20 MG tablet, Take 1 tablet (20 mg total) by mouth every other day., Disp: 90 tablet, Rfl: 1 ?  sertraline (ZOLOFT) 100 MG tablet, TAKE 2 TABLETS BY MOUTH  DAILY, Disp: 180  tablet, Rfl: 3 ?  triamcinolone cream (KENALOG) 0.1 %, Apply 1 application topically 2 (two) times daily., Disp: 453.6 g, Rfl: 0 ?No current facility-administered medications for this visit. ? ?Facility-Administered Me

## 2021-09-03 NOTE — Progress Notes (Signed)
? ?  Subjective:  ? ? Patient ID: Megan Bradley, female    DOB: [birth year], 81 y.o.   MRN: [id number] ? ?The patient is a 81 year old female here for follow-up on her implants.  They were placed approximately 3 years ago.  She started noticing some irregularities and rippling.  She had a mammogram 6 months ago for the right side and a year ago for bilateral.  The implant rippling was noticed but no areas of concern.  Do not see any areas of concern today.  She does have capsular contracture which obscures the natural look of implants.  She is not interested in having them removed at this time.  She is also possibly going to start a medication that is 2 times a month, Dupixin, for a skin condition.  This may affect her healing.  She is otherwise doing really well. ? ? ? ? ?Review of Systems  ?Constitutional: Negative.   ?HENT: Negative.    ?Eyes: Negative.   ?Respiratory: Negative.    ?Cardiovascular: Negative.   ?Gastrointestinal: Negative.   ?Genitourinary: Negative.   ? ?   ?Objective:  ? Physical Exam ?Constitutional:   ?   Appearance: Normal appearance.  ?Cardiovascular:  ?   Rate and Rhythm: Normal rate.  ?   Pulses: Normal pulses.  ?Pulmonary:  ?   Effort: Pulmonary effort is normal.  ?Abdominal:  ?   Palpations: Abdomen is soft.  ?Musculoskeletal:     ?   General: No swelling or deformity.  ?Skin: ?   General: Skin is warm.  ?   Capillary Refill: Capillary refill takes less than 2 seconds.  ?   Coloration: Skin is not jaundiced.  ?   Findings: Lesion present. No bruising.  ?Neurological:  ?   Mental Status: She is alert and oriented to person, place, and time.  ?Psychiatric:     ?   Mood and Affect: Mood normal.     ?   Behavior: Behavior normal.     ?   Thought Content: Thought content normal.  ? ? ?   ?Assessment & Plan:  ? ?  ICD-10-CM   ?1. Type 2 diabetes mellitus with diabetic neuropathy, without long-term current use of insulin (HCC)  E11.40   ?  ?2. S/P breast implant, silicone  B84.66   ?  ?  ?Pictures  were obtained of the patient and placed in the chart with the patient's or guardian's permission. ? ?We agreed to repeat the mammogram in 6 months and I will see her back in 1 year.  She does not want to do any surgery at this time.  I think that is reasonable.  I'll see her back in one year. ? ?

## 2021-09-05 ENCOUNTER — Other Ambulatory Visit: Payer: Self-pay | Admitting: Internal Medicine

## 2021-09-06 ENCOUNTER — Telehealth: Payer: Self-pay | Admitting: Internal Medicine

## 2021-09-06 NOTE — Telephone Encounter (Signed)
Copied from [place] [id number]. Topic: Medicare AWV ?>> Sep 06, 2021  1:36 PM [facility], Hannah Beat wrote: ?Reason for CRM: Left message for patient to schedule Annual Wellness Visit.  Please schedule with Nurse Health Advisor Denisa O'Brien-Blaney, LPN at [facility].  Please call [phone] ask for Juliann Pulse ?

## 2021-09-16 ENCOUNTER — Encounter: Payer: Self-pay | Admitting: Internal Medicine

## 2021-09-17 ENCOUNTER — Encounter: Payer: Self-pay | Admitting: Internal Medicine

## 2021-09-17 DIAGNOSIS — R21 Rash and other nonspecific skin eruption: Secondary | ICD-10-CM | POA: Insufficient documentation

## 2021-09-24 DIAGNOSIS — C4441 Basal cell carcinoma of skin of scalp and neck: Secondary | ICD-10-CM | POA: Diagnosis not present

## 2021-09-24 DIAGNOSIS — L2089 Other atopic dermatitis: Secondary | ICD-10-CM | POA: Diagnosis not present

## 2021-09-24 DIAGNOSIS — L905 Scar conditions and fibrosis of skin: Secondary | ICD-10-CM | POA: Diagnosis not present

## 2021-10-22 ENCOUNTER — Other Ambulatory Visit: Payer: Self-pay | Admitting: Cardiovascular Disease

## 2021-10-23 NOTE — Telephone Encounter (Signed)
Scheduled

## 2021-10-23 NOTE — Telephone Encounter (Signed)
Please schedule 6 month F/U appointment for 90 day refills. Thank you! 

## 2021-10-24 ENCOUNTER — Ambulatory Visit (INDEPENDENT_AMBULATORY_CARE_PROVIDER_SITE_OTHER): Payer: Medicare Other

## 2021-10-24 VITALS — Ht 65.0 in | Wt 141.0 lb

## 2021-10-24 DIAGNOSIS — Z0000 Encounter for general adult medical examination without abnormal findings: Secondary | ICD-10-CM

## 2021-10-24 NOTE — Progress Notes (Addendum)
Subjective:   Megan Bradley is a 81 y.o. female who presents for Medicare Annual (Subsequent) preventive examination.  Review of Systems    No ROS.  Medicare Wellness Virtual Visit.  Visual/audio telehealth visit, UTA vital signs.   See social history for additional risk factors.   Cardiac Risk Factors include: advanced age (>2mn, >>45women);diabetes mellitus     Objective:    Today's Vitals   10/24/21 0911  Weight: 141 lb (64 kg)  Height: '5\' 5"'$  (1.651 m)   Body mass index is 23.46 kg/m.     10/24/2021    9:16 AM 09/03/2021    1:21 PM 03/01/2021    2:01 PM 01/29/2021    1:55 PM 10/25/2020    6:42 AM 10/02/2020    6:43 AM 09/26/2020    9:59 AM  Advanced Directives  Does Patient Have a Medical Advance Directive? Yes Yes Yes Yes Yes Yes Yes  Type of AParamedicof ASouth [place]Living will [place]Living will [place]Living will  HLas [place]Living will [place]Living will   Does patient want to make changes to medical advance directive? No - Patient declined  No - Patient declined  No - Patient declined No - Patient declined   Copy of HArnegardin Chart? No - copy requested    No - copy requested No - copy requested     Current Medications (verified) Outpatient Encounter Medications as of 10/24/2021  Medication Sig   ALPRAZolam (XANAX) 0.5 MG tablet Take 1 tablet by mouth twice daily as needed for anxiety   ezetimibe (ZETIA) 10 MG tablet TAKE 1 TABLET BY MOUTH  DAILY   furosemide (LASIX) 20 MG tablet TAKE 1 TABLET BY MOUTH  DAILY AS NEEDED FOR  SWELLING/FLUID RETENTION   glucose blood (ACCU-CHEK GUIDE) test strip Use to check blood sugars once daily. Dx Code: E11.9   hyoscyamine (LEVSIN SL) 0.125 MG SL tablet Take 1 tablet (0.125 mg total) by mouth every 4 (four) hours as needed.   isosorbide mononitrate (IMDUR) 30 MG 24 hr tablet TAKE 1 TABLET BY MOUTH DAILY    levothyroxine (SYNTHROID) 112 MCG tablet TAKE 1 TABLET BY MOUTH  DAILY BEFORE BREAKFAST   meclizine (ANTIVERT) 25 MG tablet TAKE 1 TABLET BY MOUTH THREE TIMES DAILY AS NEEDED FOR VERTIGO   omeprazole (PRILOSEC) 40 MG capsule TAKE 1 CAPSULE BY MOUTH  TWICE DAILY   potassium chloride (KLOR-CON) 10 MEQ tablet Take 1 tablet (10 mEq total) by mouth daily as needed (with lasix).   rosuvastatin (CRESTOR) 20 MG tablet Take 1 tablet (20 mg total) by mouth every other day.   sertraline (ZOLOFT) 100 MG tablet TAKE 2 TABLETS BY MOUTH  DAILY   triamcinolone cream (KENALOG) 0.1 % Apply 1 application topically 2 (two) times daily.   Facility-Administered Encounter Medications as of 10/24/2021  Medication   clindamycin (CLEOCIN) IVPB 600 mg    Allergies (verified) Codeine and Fentanyl   History: Past Medical History:  Diagnosis Date   Anxiety    Anxiety disorder    Aortic atherosclerosis (HCC)    Arthritis    both knees   Breast disorder in female 10/14/2019   Cervical dysplasia    a. 2010 - high grade squamous intraepithelial lesion s/p excision.   Chest pain    a. 2006 or 2007 Cath ([facility]: reportedly nl cors;  b. 09/2011 ETT: nl.   DOE (dyspnea on exertion)  Family history of adverse reaction to anesthesia    grand daughter was hard to wake up after back surgery   GERD (gastroesophageal reflux disease)    Grade II diastolic dysfunction    Herpes zoster    Hypothyroidism    Mild aortic regurgitation    Nutcracker esophagus    Orthostatic hypotension    Pneumonia    Pre-diabetes    Ruptured left breast implant, initial encounter 07/05/2018   Sleep apnea    does not use cpap, did not tolerate   Vertigo    Wears dentures    partial lower   Past Surgical History:  Procedure Laterality Date   AUGMENTATION MAMMAPLASTY Bilateral    BREAST EXCISIONAL BIOPSY Left    CARDIAC CATHETERIZATION  2003   Dr.Callwood, R/L heart cath,   COLONOSCOPY WITH PROPOFOL N/A 06/12/2017   Procedure:  COLONOSCOPY WITH PROPOFOL;  Surgeon: Lucilla Lame, MD;  Location: [facility];  Service: Endoscopy;  Laterality: N/A;   dental work     ESOPHAGEAL MANOMETRY N/A 06/19/2016   Procedure: ESOPHAGEAL MANOMETRY (EM);  Surgeon: Lucilla Lame, MD;  Location: [facility];  Service: Endoscopy;  Laterality: N/A;   ESOPHAGOGASTRODUODENOSCOPY (EGD) WITH PROPOFOL N/A 04/03/2017   Procedure: ESOPHAGOGASTRODUODENOSCOPY (EGD) WITH PROPOFOL;  Surgeon: Lucilla Lame, MD;  Location: [facility];  Service: Endoscopy;  Laterality: N/A;  diabetic - diet controlled   THYROIDECTOMY  05/15/15   [facility]   TONSILLECTOMY     TOTAL KNEE ARTHROPLASTY Right 10/25/2020   Procedure: RIGHT TOTAL KNEE ARTHROPLASTY;  Surgeon: Thornton Park, MD;  Location: [facility];  Service: Orthopedics;  Laterality: Right;   Family History  Problem Relation Age of Onset   Heart attack Mother    Alcohol abuse Mother    Mental illness Mother        died in her 31's of alzheimer's Dementia   Heart disease Mother 22   Heart attack Father    Heart disease Father 44       AMI - died @ 3.   Colon cancer Paternal Grandmother 52   Social History   Socioeconomic History   Marital status: Married    Spouse name: [name]   Number of children: Not on file   Years of education: Not on file   Highest education level: Not on file  Occupational History   Not on file  Tobacco Use   Smoking status: Never   Smokeless tobacco: Never  Vaping Use   Vaping Use: Never used  Substance and Sexual Activity   Alcohol use: Yes    Comment: wine once a week   Drug use: No   Sexual activity: Never  Other Topics Concern   Not on file  Social History Narrative   Lives in [place].  Active but doesn't exercise. Retired from [employer].   Social Determinants of Health   Financial Resource Strain: Low Risk    Difficulty of Paying Living Expenses: Not hard at all  Food Insecurity: No Food Insecurity   Worried About Charity fundraiser in the Last  Year: Never true   [place] in the Last Year: Never true  Transportation Needs: No Transportation Needs   Lack of Transportation (Medical): No   Lack of Transportation (Non-Medical): No  Physical Activity: Not on file  Stress: No Stress Concern Present   Feeling of Stress : Not at all  Social Connections: Unknown   Frequency of Communication with Friends and Family: More than three times a  week   Frequency of Social Gatherings with Friends and Family: More than three times a week   Attends Religious Services: Not on Electrical engineer or Organizations: Not on file   Attends Archivist Meetings: Not on file   Marital Status: Married    Tobacco Counseling Counseling given: Not Answered   Clinical Intake:  Pre-visit preparation completed: Yes        Diabetes: Yes (Followed by PCP)  How often do you need to have someone help you when you read instructions, pamphlets, or other written materials from your doctor or pharmacy?: 1 - Never   Interpreter Needed?: No      Activities of Daily Living    10/24/2021    9:18 AM 10/25/2020    1:00 PM  In your present state of health, do you have any difficulty performing the following activities:  Hearing? 0 0  Vision? 0 0  Difficulty concentrating or making decisions? 0 0  Walking or climbing stairs? 0 1  Dressing or bathing? 0 0  Doing errands, shopping?  0  Preparing Food and eating ? N   Using the Toilet? N   In the past six months, have you accidently leaked urine? N   Do you have problems with loss of bowel control? N   Managing your Medications? N   Managing your Finances? N   Housekeeping or managing your Housekeeping? N     Patient Care Team: Crecencio Mc, MD as PCP - General (Internal Medicine) Minna Merritts, MD as PCP - Cardiology (Cardiology)  Indicate any recent Medical Services you may have received from other than [facility] providers in the past year (date may be  approximate).     Assessment:   This is a routine wellness examination for Watson.  Virtual Visit via Telephone Note  I connected with  Megan Bradley on 10/24/21 at  8:45 AM EDT by telephone and verified that I am speaking with the correct person using two identifiers.  Persons participating in the virtual visit: patient/Nurse Health Advisor   I discussed the limitations of performing an evaluation and management service by telehealth. We continued and completed visit with audio only. Some vital signs may be absent or patient reported.   Hearing/Vision screen Hearing Screening - Comments:: Patient is able to hear conversational tones without difficulty. No issues reported. Vision Screening - Comments:: Followed by [facility]  Wears corrective lenses  Annual exams  Cataract extraction, bilateral   Dietary issues and exercise activities discussed:   Healthy diet Good water intake   Goals Addressed             This Visit's Progress    Maintain Healthy Lifestyle       Stay active Healthy diet       Depression Screen    10/24/2021    9:15 AM 07/30/2021   10:40 AM 05/14/2021   12:40 PM 03/04/2021    2:38 PM 01/07/2021    3:41 PM 09/18/2020    2:17 PM 07/09/2020    2:01 PM  PHQ 2/9 Scores  PHQ - 2 Score 0 '1 1 1 3 2 '$ 0  PHQ- 9 Score  '3 3  8 5     '$ Fall Risk    10/24/2021    9:17 AM 07/30/2021   10:39 AM 05/14/2021   12:38 PM 03/04/2021    2:38 PM 02/25/2021    1:59 PM  [place]  in the past year?  1 0 1 0  Number falls in past yr:  0  0   Injury with Fall?  1  1   Risk for fall due to :  History of fall(s) No Fall Risks  History of fall(s)  Follow up Falls evaluation completed Falls evaluation completed Falls evaluation completed Falls evaluation completed Falls evaluation completed    [place]: Home free of loose throw rugs in walkways, pet beds, electrical cords, etc? Yes  Adequate lighting in your home to reduce  risk of falls? Yes   ASSISTIVE DEVICES UTILIZED TO PREVENT FALLS: Life alert? No  Use of a cane, walker or w/c? No   TIMED UP AND GO: Was the test performed? No .   Cognitive Function: Patient is alert and oriented x3.     04/18/2015    3:57 PM  MMSE - Mini Mental State Exam  Orientation to time 5  Orientation to Place 5  Registration 3  Attention/ Calculation 5  Recall 3  Language- name 2 objects 2  Language- repeat 1  Language- follow 3 step command 3  Language- read & follow direction 1  Write a sentence 1  Copy design 1  Total score 30        08/17/2019    9:56 AM 06/18/2018    3:03 PM 06/17/2017    3:03 PM 05/27/2016   11:43 AM  6CIT Screen  What Year? 0 points 0 points 0 points 0 points  What month? 0 points 0 points 0 points 0 points  What time? 0 points 0 points 0 points 0 points  Count back from 20  0 points 0 points 0 points  Months in reverse  0 points 0 points 0 points  Repeat phrase  0 points 0 points   Total Score  0 points 0 points     Immunizations Immunization History  Administered Date(s) Administered   DTaP 12/15/2011   Fluad Quad(high Dose 65+) 02/25/2019, 03/04/2021   Influenza Split 04/14/2011, 03/02/2012   Influenza, High Dose Seasonal PF 02/18/2016, 03/23/2017, 03/29/2018   Influenza,inj,Quad PF,6+ Mos 03/30/2013, 03/04/2014, 03/02/2015   Influenza-Unspecified 03/28/2015, 03/06/2020, 03/12/2020   PFIZER(Purple Top)SARS-COV-2 Vaccination 06/13/2019, 07/04/2019, 04/02/2020, 12/24/2020   Pfizer Covid-19 Vaccine Bivalent Booster 47yr & up 04/13/2021   Pneumococcal Conjugate-13 06/07/2014   Pneumococcal Polysaccharide-23 06/07/2008   Tdap 06/07/2012   Shingrix vaccine- discontinued per patient.   Screening Tests Health Maintenance  Topic Date Due   DEXA SCAN  10/29/2021 (Originally 03/11/2020)   INFLUENZA VACCINE  01/07/2022   FOOT EXAM  01/07/2022   HEMOGLOBIN A1C  01/27/2022   OPHTHALMOLOGY EXAM  03/05/2022   TETANUS/TDAP   06/07/2022   URINE MICROALBUMIN  07/30/2022   Pneumonia Vaccine 81 Years old  Completed   COVID-19 Vaccine  Completed   Hepatitis C Screening  Completed   HPV VACCINES  Aged Out   COLONOSCOPY (Pts 45-446yrInsurance coverage will need to be confirmed)  Discontinued   Zoster Vaccines- Shingrix  Discontinued   Health Maintenance There are no preventive care reminders to display for this patient.  Dexa Scan- deferred.   Vision Screening: Recommended annual ophthalmology exams for early detection of glaucoma and other disorders of the eye.  Dental Screening: Recommended annual dental exams for proper oral hygiene  Community Resource Referral / Chronic Care Management: CRR required this visit?  No   CCM required this visit?  No      Plan:  Keep all routine maintenance appointments.   Patient plans to discuss referral for breast implant surgery and labs. Appointment note edited for upcoming appointment.   I have personally reviewed and noted the following in the patient's chart:   Medical and social history Use of alcohol, tobacco or illicit drugs  Current medications and supplements including opioid prescriptions.  Functional ability and status Nutritional status Physical activity Advanced directives List of other physicians Hospitalizations, surgeries, and ER visits in previous 12 months Vitals Screenings to include cognitive, depression, and falls Referrals and appointments  In addition, I have reviewed and discussed with patient certain preventive protocols, quality metrics, and best practice recommendations. A written personalized care plan for preventive services as well as general preventive health recommendations were provided to patient.     OBrien-Blaney, Sinthia Karabin L, LPN   0/02/3817     I have reviewed the above information and agree with above.   Deborra Medina, MD

## 2021-10-24 NOTE — Patient Instructions (Addendum)
  Megan Bradley , Thank you for taking time to come for your Medicare Wellness Visit. I appreciate your ongoing commitment to your health goals. Please review the following plan we discussed and let me know if I can assist you in the future.   These are the goals we discussed:  Goals      Maintain Healthy Lifestyle     Stay active Healthy diet        This is a list of the screening recommended for you and due dates:  Health Maintenance  Topic Date Due   DEXA scan (bone density measurement)  10/29/2021*   Flu Shot  01/07/2022   Complete foot exam   01/07/2022   Hemoglobin A1C  01/27/2022   Eye exam for diabetics  03/05/2022   Tetanus Vaccine  06/07/2022   Urine Protein Check  07/30/2022   Pneumonia Vaccine  Completed   COVID-19 Vaccine  Completed   Hepatitis C Screening: USPSTF Recommendation to screen - Ages 11-79 yo.  Completed   HPV Vaccine  Aged Out   Colon Cancer Screening  Discontinued   Zoster (Shingles) Vaccine  Discontinued  *Topic was postponed. The date shown is not the original due date.

## 2021-10-29 ENCOUNTER — Telehealth: Payer: Self-pay | Admitting: Internal Medicine

## 2021-10-29 ENCOUNTER — Encounter: Payer: Self-pay | Admitting: Internal Medicine

## 2021-10-29 ENCOUNTER — Ambulatory Visit (INDEPENDENT_AMBULATORY_CARE_PROVIDER_SITE_OTHER): Payer: Medicare Other | Admitting: Internal Medicine

## 2021-10-29 VITALS — BP 132/68 | HR 79 | Temp 97.8°F | Ht 65.0 in | Wt 141.2 lb

## 2021-10-29 DIAGNOSIS — L281 Prurigo nodularis: Secondary | ICD-10-CM | POA: Diagnosis not present

## 2021-10-29 DIAGNOSIS — N6324 Unspecified lump in the left breast, lower inner quadrant: Secondary | ICD-10-CM | POA: Diagnosis not present

## 2021-10-29 DIAGNOSIS — E1140 Type 2 diabetes mellitus with diabetic neuropathy, unspecified: Secondary | ICD-10-CM | POA: Diagnosis not present

## 2021-10-29 DIAGNOSIS — E1169 Type 2 diabetes mellitus with other specified complication: Secondary | ICD-10-CM | POA: Diagnosis not present

## 2021-10-29 DIAGNOSIS — E785 Hyperlipidemia, unspecified: Secondary | ICD-10-CM

## 2021-10-29 DIAGNOSIS — F418 Other specified anxiety disorders: Secondary | ICD-10-CM

## 2021-10-29 DIAGNOSIS — N6459 Other signs and symptoms in breast: Secondary | ICD-10-CM | POA: Diagnosis not present

## 2021-10-29 DIAGNOSIS — Z9882 Breast implant status: Secondary | ICD-10-CM | POA: Diagnosis not present

## 2021-10-29 DIAGNOSIS — R600 Localized edema: Secondary | ICD-10-CM | POA: Diagnosis not present

## 2021-10-29 DIAGNOSIS — R1319 Other dysphagia: Secondary | ICD-10-CM | POA: Diagnosis not present

## 2021-10-29 LAB — POCT GLYCOSYLATED HEMOGLOBIN (HGB A1C): Hemoglobin A1C: 6.4 % — AB (ref 4.0–5.6)

## 2021-10-29 LAB — HM DIABETES EYE EXAM

## 2021-10-29 NOTE — Assessment & Plan Note (Signed)
With regurgitation  mbs ordered

## 2021-10-29 NOTE — Assessment & Plan Note (Signed)
Improving with injections of Dupixent by Dr [name]

## 2021-10-29 NOTE — Patient Instructions (Addendum)
Referral for swallow evaluation in process  Consider counselling using one of the providers listed.  [place] of left breast ordered   REFERRAL TO DR ZENN IN [place] (PLASTIC SURGEON )  A1C IS 6.4

## 2021-10-29 NOTE — Assessment & Plan Note (Signed)
Her self esteem has been hurt by her skin and breast issues   Referral for counselling recommended

## 2021-10-29 NOTE — Assessment & Plan Note (Signed)
She has hi  well-controlled on low GI diet alone and has no history of Microalbuminuria . Patient is tolerating statin therapy for CAD risk reduction.    Lab Results  Component Value Date   HGBA1C 6.4 (A) 10/29/2021   Lab Results  Component Value Date   MICROALBUR 1.0 07/30/2021

## 2021-10-29 NOTE — Assessment & Plan Note (Signed)
Resolved with dc cardizem prescribed for nutcracker esophagus

## 2021-10-29 NOTE — Assessment & Plan Note (Signed)
Ultrasound lef left breast ordered to confirm that the bumps are folds in the silicone. Referral to Dr Harle Stanford in [place] for breast implant replacement

## 2021-10-29 NOTE — Progress Notes (Signed)
Subjective:  Patient ID: Megan Bradley, female    DOB: 1941/05/19  Age: 81 y.o. MRN: [id number]  CC: The primary encounter diagnosis was Hyperlipidemia associated with type 2 diabetes mellitus ([place]). Diagnoses of Abnormal breast finding, Esophageal dysphagia, Mass of lower inner quadrant of left breast, S/P breast implant, silicone, Anxiety with obsessional features, Bilateral lower extremity edema, Prurigo nodularis, and Type 2 diabetes mellitus with diabetic neuropathy, without long-term current use of insulin ([name]) were also pertinent to this visit.   HPI [name] presents for follow up on multiple issues  Chief Complaint  Patient presents with   Follow-up    3 month follow up   Cc:  esophageal problems:  she has developed a cough after eating.   having trouble clearing throat of pills  food,    At times she reurgitating liquid and pills which have come out of her nose.   Often regurgitates food,  has pain with swallowing meat.  Often wakes up with a productive cough. H/o nutracker esophagus :  diltiazem was stopped by gollan due to edema.  He Prescribed Imdur  30 mg daily instead with good results.  She has had  no episodes in quite a while,  takes SL a levsin if her ear starts to hurt which is ogte a sign of an impending contraction  Depression:  secondary to husband's  lack of sexual desire  and her demoralizing skin condition making her feel unattractive.  Taking 200 mg sertraline .  Counselling given. Self esteem issues   frustrated and angry,  not depressed     urticaria: diagnosed with prurigo nodularis by Dr [name],   Dupixent prescribed. Has received 4 injections thus far,  no cost to her.  She is seeing an improvement in her skin conditons   2) lumpy breast :  she underwent removal of rupture silicone implant  3 yrs ago with replacement of implants by Dr [name].  she is unhappy with the asymmetric results . One breast is higher than the other.  And she has developed  folds in the silicone which are palpable as  breast bumps" .  The right breast was evaluated with  U/S in October 2022.    Saw [name] in March and was offered replacement but deferred.  She is requesting a a second opinion from another provider but does not have a name of another provider.  She is also requesting an U/s of the left breast because of bumps felt on the lower medial quarter   3)   TYPE 2  DM.   Eye exam up to date.  Foot exam done today  fasting 94 lately  by Dr. [name] at  [facility]    Outpatient Medications Prior to Visit  Medication Sig Dispense Refill   ALPRAZolam (XANAX) 0.5 MG tablet Take 1 tablet by mouth twice daily as needed for anxiety 60 tablet 2   DUPIXENT 300 MG/2ML SOPN      ezetimibe (ZETIA) 10 MG tablet TAKE 1 TABLET BY MOUTH  DAILY 90 tablet 3   furosemide (LASIX) 20 MG tablet TAKE 1 TABLET BY MOUTH  DAILY AS NEEDED FOR  SWELLING/FLUID RETENTION 90 tablet 3   glucose blood (ACCU-CHEK GUIDE) test strip Use to check blood sugars once daily. Dx Code: E11.9 100 each 3   hyoscyamine (LEVSIN SL) 0.125 MG SL tablet Take 1 tablet (0.125 mg total) by mouth every 4 (four) hours as needed. 30 tablet 0   isosorbide  mononitrate (IMDUR) 30 MG 24 hr tablet TAKE 1 TABLET BY MOUTH DAILY 90 tablet 0   levothyroxine (SYNTHROID) 112 MCG tablet TAKE 1 TABLET BY MOUTH  DAILY BEFORE BREAKFAST 90 tablet 3   meclizine (ANTIVERT) 25 MG tablet TAKE 1 TABLET BY MOUTH THREE TIMES DAILY AS NEEDED FOR VERTIGO 30 tablet 0   omeprazole (PRILOSEC) 40 MG capsule TAKE 1 CAPSULE BY MOUTH  TWICE DAILY 180 capsule 3   potassium chloride (KLOR-CON) 10 MEQ tablet Take 1 tablet (10 mEq total) by mouth daily as needed (with lasix). 30 tablet 3   rosuvastatin (CRESTOR) 20 MG tablet Take 1 tablet (20 mg total) by mouth every other day. 90 tablet 1   sertraline (ZOLOFT) 100 MG tablet TAKE 2 TABLETS BY MOUTH  DAILY 180 tablet 3   triamcinolone cream (KENALOG) 0.1 % Apply 1 application topically 2  (two) times daily. 453.6 g 0   Facility-Administered Medications Prior to Visit  Medication Dose Route Frequency Provider Last Rate Last Admin   clindamycin (CLEOCIN) IVPB 600 mg  600 mg Intravenous Once [place], MD        Review of Systems;  Patient denies headache, fevers, malaise, unintentional weight loss, skin rash, eye pain, sinus congestion and sinus pain, sore throat, dysphagia,  hemoptysis , cough, dyspnea, wheezing, chest pain, palpitations, orthopnea, edema, abdominal pain, nausea, melena, diarrhea, constipation, flank pain, dysuria, hematuria, urinary  Frequency, nocturia, numbness, tingling, seizures,  Focal weakness, Loss of consciousness,  Tremor, insomnia, depression, anxiety, and suicidal ideation.      Objective:  BP 132/68 (BP Location: Left Arm, Patient Position: Sitting, Cuff Size: Normal)   Pulse 79   Temp 97.8 F (36.6 C) (Oral)   Ht '5\' 5"'$  (1.651 m)   Wt 141 lb 3.2 oz (64 kg)   SpO2 96%   BMI 23.50 kg/m   BP Readings from Last 3 Encounters:  10/29/21 132/68  09/03/21 105/60  08/26/21 125/60    Wt Readings from Last 3 Encounters:  10/29/21 141 lb 3.2 oz (64 kg)  10/24/21 141 lb (64 kg)  09/03/21 141 lb 6.4 oz (64.1 kg)    General appearance: alert, cooperative and appears stated age Ears: normal TM's and external ear canals both ears Throat: lips, mucosa, and tongue normal; teeth and gums normal Neck: no adenopathy, no carotid bruit, supple, symmetrical, trachea midline and thyroid not enlarged, symmetric, no tenderness/mass/nodules Back: symmetric, no curvature. ROM normal. No CVA tenderness. Lungs: clear to auscultation bilaterally Heart: regular rate and rhythm, S1, S2 normal, no murmur, click, rub or gallop Abdomen: soft, non-tender; bowel sounds normal; no masses,  no organomegaly Pulses: 2+ and symmetric Skin: Skin color, texture, turgor normal. No rashes or lesions Lymph nodes: Cervical, supraclavicular, and axillary nodes  normal.  Lab Results  Component Value Date   HGBA1C 6.4 (A) 10/29/2021   HGBA1C 6.9 (H) 07/30/2021   HGBA1C 6.3 12/13/2020    Lab Results  Component Value Date   CREATININE 0.96 07/30/2021   CREATININE 0.89 05/14/2021   CREATININE 0.86 01/16/2021    Lab Results  Component Value Date   WBC 5.4 07/30/2021   HGB 11.1 (L) 07/30/2021   HCT 34.0 (L) 07/30/2021   PLT 131.0 (L) 07/30/2021   GLUCOSE 99 07/30/2021   CHOL 136 07/30/2021   TRIG 45.0 07/30/2021   HDL 71.90 07/30/2021   LDLDIRECT 93.0 01/02/2016   LDLCALC 55 07/30/2021   ALT 17 07/30/2021   AST 21 07/30/2021   NA 139 07/30/2021  K 4.3 07/30/2021   CL 103 07/30/2021   CREATININE 0.96 07/30/2021   BUN 20 07/30/2021   CO2 33 (H) 07/30/2021   TSH 5.08 12/13/2020   INR 1.0 09/26/2020   HGBA1C 6.4 (A) 10/29/2021   MICROALBUR 1.0 07/30/2021    US BREAST LTD UNI RIGHT INC AXILLA  Result Date: 03/14/2021 CLINICAL DATA:  81 year old female presenting for evaluation of a palpable lump in the right breast. The patient has bilateral retroglandular implants which were replaced 3 years ago following rupture. Her initial implants were placed in 1974. EXAM: DIGITAL DIAGNOSTIC BILATERAL MAMMOGRAM WITH IMPLANTS, CAD AND TOMOSYNTHESIS; ULTRASOUND RIGHT BREAST LIMITED TECHNIQUE: Bilateral digital diagnostic mammography and breast tomosynthesis was performed. The images were evaluated with computer-aided detection. Standard and/or implant displaced views were performed.; Targeted ultrasound examination of the right breast was performed COMPARISON:  Previous exam(s). ACR Breast Density Category b: There are scattered areas of fibroglandular density. FINDINGS: A BB indicating the palpable site of concern has been placed along the superior aspect of the right breast. No suspicious findings are seen deep to the palpable marker within the breast tissue. There is a focal folding of the implant at this site. No suspicious calcifications, masses or  areas of distortion are seen in the bilateral breasts. The patient has retroglandular implants. Ultrasound targeted to the palpable site right breast at 12 o'clock, approximately 10 cm from the nipple demonstrates a focal folding of the implant which protrudes close to the skin surface. There is no snowstorm pattern to suggest extracapsular silicone rupture. No suspicious findings are found within the breast parenchyma. IMPRESSION: 1. The palpable lump in the right breast corresponds with a focal fold within the patient's breast implant. 2.  No mammographic evidence of malignancy in the bilateral breasts. RECOMMENDATION: Screening mammogram in one year.(Code:SM-B-01Y) I have discussed the findings and recommendations with the patient. If applicable, a reminder letter will be sent to the patient regarding the next appointment. BI-RADS CATEGORY  1: Negative. Electronically Signed   By: Ammie Ferrier M.D.   On: 03/14/2021 16:06  MM DIAG BREAST W/IMPLANT TOMO BILATERAL  Result Date: 03/14/2021 CLINICAL DATA:  81 year old female presenting for evaluation of a palpable lump in the right breast. The patient has bilateral retroglandular implants which were replaced 3 years ago following rupture. Her initial implants were placed in 1974. EXAM: DIGITAL DIAGNOSTIC BILATERAL MAMMOGRAM WITH IMPLANTS, CAD AND TOMOSYNTHESIS; ULTRASOUND RIGHT BREAST LIMITED TECHNIQUE: Bilateral digital diagnostic mammography and breast tomosynthesis was performed. The images were evaluated with computer-aided detection. Standard and/or implant displaced views were performed.; Targeted ultrasound examination of the right breast was performed COMPARISON:  Previous exam(s). ACR Breast Density Category b: There are scattered areas of fibroglandular density. FINDINGS: A BB indicating the palpable site of concern has been placed along the superior aspect of the right breast. No suspicious findings are seen deep to the palpable marker within the  breast tissue. There is a focal folding of the implant at this site. No suspicious calcifications, masses or areas of distortion are seen in the bilateral breasts. The patient has retroglandular implants. Ultrasound targeted to the palpable site right breast at 12 o'clock, approximately 10 cm from the nipple demonstrates a focal folding of the implant which protrudes close to the skin surface. There is no snowstorm pattern to suggest extracapsular silicone rupture. No suspicious findings are found within the breast parenchyma. IMPRESSION: 1. The palpable lump in the right breast corresponds with a focal fold within the patient's breast implant. 2.  No mammographic evidence of malignancy in the bilateral breasts. RECOMMENDATION: Screening mammogram in one year.(Code:SM-B-01Y) I have discussed the findings and recommendations with the patient. If applicable, a reminder letter will be sent to the patient regarding the next appointment. BI-RADS CATEGORY  1: Negative. Electronically Signed   By: Ammie Ferrier M.D.   On: 03/14/2021 16:06    Assessment & Plan:   Problem List Items Addressed This Visit     Abnormal breast finding    Ultrasound lef left breast ordered to confirm that the bumps are folds in the silicone. Referral to Dr Harle Stanford in [place] for breast implant replacement        Anxiety with obsessional features    Her self esteem has been hurt by her skin and breast issues   Referral for counselling recommended         Bilateral lower extremity edema    Resolved with dc cardizem prescribed for nutcracker esophagus        Dysphagia    With regurgitation  mbs ordered        Relevant Orders   DG SWALLOW STUDY OP MEDICARE SPEECH PATH   Hyperlipidemia associated with type 2 diabetes mellitus ([place]) - Primary   Relevant Orders   POCT HgB A1C (Completed)   Prurigo nodularis    Improving with injections of Dupixent by Dr [name]        S/P breast implant, silicone   Relevant Orders    Ambulatory referral to Plastic Surgery   Type 2 diabetes mellitus ([place])    She has hi  well-controlled on low GI diet alone and has no history of Microalbuminuria . Patient is tolerating statin therapy for CAD risk reduction.    Lab Results  Component Value Date   HGBA1C 6.4 (A) 10/29/2021   Lab Results  Component Value Date   MICROALBUR 1.0 07/30/2021         Other Visit Diagnoses     Mass of lower inner quadrant of left breast       Relevant Orders   US BREAST LTD UNI LEFT INC AXILLA      4 I spent a total of  42 minutes with this patient in a face to face visit on the date of this encounter reviewing the last office visit with me  in February,  her  most recent visit with Dr Marla Roe and Dr Rockey Situ, her  most recent imaging study ,   and post visit ordering of testing and therapeutics.    Follow-up: No follow-ups on file.   Crecencio Mc, MD

## 2021-10-29 NOTE — Telephone Encounter (Signed)
Lft pt vm to call ofc to sch DG swallow. thanks

## 2021-10-30 ENCOUNTER — Telehealth: Payer: Self-pay

## 2021-10-30 ENCOUNTER — Telehealth: Payer: Self-pay | Admitting: Internal Medicine

## 2021-10-30 NOTE — Telephone Encounter (Signed)
Patient states she is returning [place] call regarding her swallow test.

## 2021-10-30 NOTE — Telephone Encounter (Signed)
Lft pt vm to call ofc to sch barium swallow and to give the number to [place]. thanks

## 2021-11-04 ENCOUNTER — Encounter: Payer: Self-pay | Admitting: Internal Medicine

## 2021-11-04 DIAGNOSIS — N6324 Unspecified lump in the left breast, lower inner quadrant: Secondary | ICD-10-CM

## 2021-11-06 ENCOUNTER — Other Ambulatory Visit: Payer: Self-pay | Admitting: Internal Medicine

## 2021-11-06 DIAGNOSIS — N6324 Unspecified lump in the left breast, lower inner quadrant: Secondary | ICD-10-CM

## 2021-11-11 DIAGNOSIS — X32XXXA Exposure to sunlight, initial encounter: Secondary | ICD-10-CM | POA: Diagnosis not present

## 2021-11-11 DIAGNOSIS — L570 Actinic keratosis: Secondary | ICD-10-CM | POA: Diagnosis not present

## 2021-11-11 DIAGNOSIS — Z85828 Personal history of other malignant neoplasm of skin: Secondary | ICD-10-CM | POA: Diagnosis not present

## 2021-11-11 DIAGNOSIS — Z08 Encounter for follow-up examination after completed treatment for malignant neoplasm: Secondary | ICD-10-CM | POA: Diagnosis not present

## 2021-11-11 DIAGNOSIS — L2089 Other atopic dermatitis: Secondary | ICD-10-CM | POA: Diagnosis not present

## 2021-11-11 DIAGNOSIS — C44319 Basal cell carcinoma of skin of other parts of face: Secondary | ICD-10-CM | POA: Diagnosis not present

## 2021-11-11 DIAGNOSIS — L281 Prurigo nodularis: Secondary | ICD-10-CM | POA: Diagnosis not present

## 2021-11-11 DIAGNOSIS — D485 Neoplasm of uncertain behavior of skin: Secondary | ICD-10-CM | POA: Diagnosis not present

## 2021-11-12 ENCOUNTER — Ambulatory Visit
Admission: RE | Admit: 2021-11-12 | Discharge: 2021-11-12 | Disposition: A | Discharge status: Home / Self Care | Payer: Medicare Other | Source: Ambulatory Visit | Attending: Internal Medicine | Admitting: Internal Medicine

## 2021-11-12 DIAGNOSIS — N6322 Unspecified lump in the left breast, upper inner quadrant: Secondary | ICD-10-CM | POA: Diagnosis not present

## 2021-11-12 DIAGNOSIS — N6324 Unspecified lump in the left breast, lower inner quadrant: Secondary | ICD-10-CM

## 2021-11-12 DIAGNOSIS — R928 Other abnormal and inconclusive findings on diagnostic imaging of breast: Secondary | ICD-10-CM | POA: Diagnosis not present

## 2021-11-12 DIAGNOSIS — N6321 Unspecified lump in the left breast, upper outer quadrant: Secondary | ICD-10-CM | POA: Diagnosis not present

## 2021-11-22 ENCOUNTER — Ambulatory Visit
Admission: RE | Admit: 2021-11-22 | Discharge: 2021-11-22 | Disposition: A | Discharge status: Home / Self Care | Payer: Medicare Other | Source: Ambulatory Visit | Attending: Internal Medicine | Admitting: Internal Medicine

## 2021-11-22 DIAGNOSIS — R1319 Other dysphagia: Secondary | ICD-10-CM | POA: Diagnosis not present

## 2021-11-22 DIAGNOSIS — R1310 Dysphagia, unspecified: Secondary | ICD-10-CM | POA: Diagnosis not present

## 2021-11-25 ENCOUNTER — Other Ambulatory Visit: Payer: Medicare Other

## 2021-11-25 NOTE — Progress Notes (Signed)
Modified Barium Swallow Progress Note  Patient Details  Name: Megan Bradley MRN: [id number] Date of Birth: 08-Jan-1941  Today's Date: 11/25/2021  Modified Barium Swallow completed.  Full report located under Chart Review in the Imaging Section.  Brief recommendations include the following:  Clinical Impression  Pt presents with adequate oropharyngeal abilities when consuming thin liquids, puree, graham cracker with barium and whole barium tablet with thin liquids. While pt's oropharyngeal abilities are within normal limits, when consuming solids, pt's oral phase is c/b oral stasis of boluses that appears directly related to possible bolus stasis in the upper esophagus. When consuming solids, pt MUST take a sip of liquid in between to help boluses transit thru the upper esophagus. Extensive education with video playback provided to pt. Recommend pt follow up with Dr Allen Norris if she has any further questions.   Swallow Evaluation Recommendations   Recommended Consults: Consider GI evaluation;Consider esophageal assessment   SLP Diet Recommendations: Regular solids;Thin liquid   Liquid Administration via: Cup;Straw   Medication Administration: Whole meds with liquid   Supervision: Patient able to self feed   Compensations: Follow solids with liquid   Postural Changes: Seated upright at 90 degrees;Remain semi-upright after after feeds/meals (Comment)   Oral Care Recommendations: Oral care BID       Russell Engelstad B. Rutherford Nail, M.S., CCC-SLP, [place] Pathologist Certified Brain Injury [place] Office [phone] Ascom [phone] Fax [phone]  Zuri Lascala Rutherford Nail 11/25/2021,12:40 PM

## 2021-11-26 ENCOUNTER — Encounter: Payer: Self-pay | Admitting: Internal Medicine

## 2021-11-26 ENCOUNTER — Other Ambulatory Visit: Payer: Self-pay

## 2021-11-26 DIAGNOSIS — R1313 Dysphagia, pharyngeal phase: Secondary | ICD-10-CM

## 2021-11-26 MED ORDER — ACCU-CHEK GUIDE VI STRP: ORAL_STRIP | 3 refills | Status: DC

## 2021-12-02 NOTE — Telephone Encounter (Signed)
Pt called in requesting referral for GI/Endoscopy... Pt called in stating that she failed the swallow test... Pt stated that the provider is requesting pt to have an endoscopy... pt stated that she called GI to schedule appt... Pt stated that GI advised her that since she havent been seen in office in 3 years that a referral is required... Pt requesting callback

## 2021-12-06 ENCOUNTER — Telehealth: Payer: Self-pay

## 2021-12-06 ENCOUNTER — Encounter: Payer: Self-pay | Admitting: Internal Medicine

## 2021-12-06 DIAGNOSIS — R1319 Other dysphagia: Secondary | ICD-10-CM

## 2021-12-06 DIAGNOSIS — K224 Dyskinesia of esophagus: Secondary | ICD-10-CM

## 2021-12-06 NOTE — Telephone Encounter (Signed)
I have placed the referral and signed it.

## 2021-12-06 NOTE — Telephone Encounter (Signed)
Pt is scheduled with [facility] on 12/17/2021 at 8:30 to arrive at 8:15. Pt is aware of appt date and time.

## 2021-12-17 ENCOUNTER — Encounter: Payer: Self-pay | Admitting: Gastroenterology

## 2021-12-17 ENCOUNTER — Other Ambulatory Visit: Payer: Self-pay | Admitting: Internal Medicine

## 2021-12-17 ENCOUNTER — Ambulatory Visit: Payer: Medicare Other | Admitting: Gastroenterology

## 2021-12-17 VITALS — BP 118/70 | HR 63 | Ht 65.0 in | Wt 139.0 lb

## 2021-12-17 DIAGNOSIS — R933 Abnormal findings on diagnostic imaging of other parts of digestive tract: Secondary | ICD-10-CM

## 2021-12-17 DIAGNOSIS — R1310 Dysphagia, unspecified: Secondary | ICD-10-CM | POA: Diagnosis not present

## 2021-12-17 NOTE — Patient Instructions (Signed)
It was my pleasure to provide care to you today. Based on our discussion, I am providing you with my recommendations below:  RECOMMENDATION(S):   I have recommended an upper endoscopy, additional swallowing x-rays, and evaluation by ENT.  In the meantime, let's see if Altoids can improve your swallowing. Use two tablets before meals as a slurry. You can also use the peppermint paste outside of meals if you are feeling esophageal spasms.    BARIUM ESOPHAGRAM:  You have been scheduled for a Barium Esophogram at [facility] (1st floor of the hospital) on Monday 12/23/21 at 9 am. Please arrive @ 8:30 am for registration. Please DO NOT eat or drink anything 3 hours prior to your test. This test typically takes about 30 minutes to perform.  NEED TO RESCHEDULE?   Please call radiology at [phone].  WHY ARE YOU HAVING THIS EXAM?  A barium swallow is an examination that concentrates on views of the esophagus. This tends to be a double contrast exam (barium and two liquids which, when combined, create a gas to distend the wall of the oesophagus) or single contrast (non-ionic iodine based). The study is usually tailored to your symptoms so a good history is essential. Attention is paid during the study to the form, structure and configuration of the esophagus, looking for functional disorders (such as aspiration, dysphagia, achalasia, motility and reflux)  EXAMINATION You may be asked to change into a gown, depending on the type of swallow being performed. A radiologist and radiographer will perform the procedure. The radiologist will advise you of the type of contrast selected for your procedure and direct you during the exam. You will be asked to stand, sit or lie in several different positions and to hold a small amount of fluid in your mouth before being asked to swallow while the imaging is performed .In some instances you may be asked to swallow barium coated marshmallows to assess the  motility of a solid food bolus. The exam can be recorded as a digital or video fluoroscopy procedure.  POST PROCEDURE It will take 1-2 days for the barium to pass through your system. To facilitate this, it is important, unless otherwise directed, to increase your fluids for the next 24-48hrs and to resume your normal diet.   REFERRAL:  A referral, your demographics, a copy of your insurance card and your records will be sent to [facility]. You will receive a call from their office regarding the date, time and location of your appointment.  ENDOSCOPY:   You have been scheduled for an endoscopy. Please follow written instructions given to you at your visit today.  INHALERS:   If you use inhalers (even only as needed), please bring them with you on the day of your procedure.  FOLLOW UP:  After your procedure, you will receive a call from my office staff regarding my recommendation for follow up.  BMI:  If you are age 18 or older, your body mass index should be between 23-30. Your Body mass index is 23.13 kg/m. If this is out of the aforementioned range listed, please consider follow up with your Primary Care Provider.  MY CHART:  The [facility] providers would like to encourage you to use [facility] to communicate with providers for non-urgent requests or questions.  Due to long hold times on the telephone, sending your provider a message by [facility] may be a faster and more efficient way to get a response.  Please allow 48 business hours for  a response.  Please remember that this is for non-urgent requests.   Thank you for trusting me with your gastrointestinal care!    [place], MD, MPH

## 2021-12-17 NOTE — Progress Notes (Signed)
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    - Previously treated with diltiazem, Levsin, nitroglycerin and Dexilant    - Currently on omeprazole 40 mg BID and Levsin PRN    - Plan repeat EGD to exclude concurrent diagnoses and esophagram to r/o Zenker's    - discussed peppermint slurry per [facility] protocol for symptomatic relief in the meantime  Possible Zenker's mentioned on recent functional swallow study.      - We will proceed with esophagram and referral to ENT  History of colon polyps    - 6 tubular adenomas removed on colonoscopy 2019    - surveillance discussed but she does not want to proceed given age  PLAN: - EGD with possible dilation and esophageal biopsies - Esophagram - [place] [facility] referral re: Zenker's diverticulum after esophagram - Continue omeprazole 40 mg BID - Discussed [place] Protocol for peppermint slurry using Altoids   The nature of EGD with possible dilation and biopsies, as well as the risks, benefits, and alternatives were carefully and thoroughly reviewed with the patient. Ample time for discussion and questions allowed. The patient understood, was satisfied, and agreed to proceed.   HPI: Megan Bradley is a 81 y.o. female referred by Dr. Derrel Nip for further evaluation of dysphagia in the setting of known nutcracker esophagus.  The history is obtained through the patient and review of her electronic health record. Last seen by Dr. Lucilla Lame in 2019 for nutcracker esophagus with atypical chest pain treated with diltiazem, Levsin, nitroglycerin, and Dexilant.  She was unable to get follow-up appointment with Dr. Verl Blalock until this fall and did not think her symptoms could wait.  She has a history of  hyperlipidemia, type 2 diabetes with diabetic neuropathy, breast mass, anxiety with obsessional features, prurigo nodularis, depression, IgG kappa MGUS, lower leg swelling, syncope. She has a very distant history of H pylori.   She has a longstanding history of dysphagia but she feels that the dysphagia is worse over the last several months.  Current complaints include coughing with eating both solids and liquids, trouble clearing throat of pills and food, frequent regurgitation of undigested food, pain with swallowing meats, atypical chest pain. She has recently had some dysphonia.  No sore throat, neck pain, blood in the stool or changes in bowel habits. Appetite is good. Weight is stable.   Currently manages her symptoms using Levsin PRN in an attempt to abort her symptoms in addition to omeprazole 40 mg BID.   Previously evaluated by cardiology. Previously treated with diltiazem which was discontinued as Dr. Rockey Situ thought this may be the cause of her lower extremity edema.  He switched her to isosorbide. Unclear if discontinuing this medication actually improved her edema as it has recurred.   Distant evaluation by ENT in [place] for severe epistaxis. She has not seen ENT for dysphagia.   Prior evaluation includes: - Esophagram 03/04/2016 was normal - High-resolution impedance manometry 07/01/2016 interpreted by Dr. Verl Blalock showed DCI pressures above 8000 and greater than 20% of contractions consistent with jackhammer esophagus. - EGD 04/05/2017 showed multiple fundic gland polyps but was otherwise normal biopsies were negative for H. pylori.  No esophageal biopsies performed. - Functional swallow study 11/22/2021 for dysphagia showed no aspiration but suggested a small Zenker's diverticulum.  Oral stasis of boluses were noted in the upper esophagus with solids.  Speech pathology recommended the patient take a sip of liquid between boluses. - Followed by Dr. Rockey Situ with chest pain felt to be due to  esophageal spasm  Colonoscopy for positive Cologuard 06/12/2017 performed to the cecum with a good bowel prep showed 3 sessile polyps in the transverse colon ranging from 4 to 7 mm.  7 sessile polyps in the sigmoid colon ranging from 3 to 6 mm.  And nonbleeding internal hemorrhoids.  Surveillance colonoscopy recommended in 5 years.  However pathology results show that at least 6 of these polyps were tubular adenomas.  The remaining polyps were polypoid colonic mucosa.  There is no known family history of colon cancer or polyps in first degree relatives. Grandmother with colon cancer. No family history of stomach cancer or other GI malignancy. No family history of inflammatory bowel disease or celiac.    Past Medical History:  Diagnosis Date   Anxiety    Anxiety disorder    Aortic atherosclerosis ([place])    Arthritis    both knees   Breast disorder in female 10/14/2019   Cervical dysplasia    a. 2010 - high grade squamous intraepithelial lesion s/p excision.   Chest pain    a. 2006 or 2007 [facility]): reportedly nl cors;  b. 09/2011 ETT: nl.   DOE (dyspnea on exertion)    Family history of adverse reaction to anesthesia    grand daughter was hard to wake up after back surgery   GERD (gastroesophageal reflux disease)    Grade II diastolic dysfunction    Herpes zoster    Hypothyroidism    Mild aortic regurgitation    Nutcracker esophagus    Orthostatic hypotension    Pneumonia    Pre-diabetes    Ruptured left breast implant, initial encounter 07/05/2018   Sleep apnea    does not use cpap, did not tolerate   Vertigo    Wears dentures    partial lower    Past Surgical History:  Procedure Laterality Date   AUGMENTATION MAMMAPLASTY Bilateral    BREAST EXCISIONAL BIOPSY Left    CARDIAC CATHETERIZATION  2003   Dr.Callwood, R/L heart cath,   COLONOSCOPY WITH PROPOFOL N/A 06/12/2017   Procedure: COLONOSCOPY WITH PROPOFOL;  Surgeon: Lucilla Lame, MD;  Location: [place];   Service: Endoscopy;  Laterality: N/A;   dental work     ESOPHAGEAL MANOMETRY N/A 06/19/2016   Procedure: ESOPHAGEAL MANOMETRY (EM);  Surgeon: Lucilla Lame, MD;  Location: [facility];  Service: Endoscopy;  Laterality: N/A;   ESOPHAGOGASTRODUODENOSCOPY (EGD) WITH PROPOFOL N/A 04/03/2017   Procedure: ESOPHAGOGASTRODUODENOSCOPY (EGD) WITH PROPOFOL;  Surgeon: Lucilla Lame, MD;  Location: [place];  Service: Endoscopy;  Laterality: N/A;  diabetic - diet controlled   THYROIDECTOMY  05/15/15   [facility]   TONSILLECTOMY     TOTAL KNEE ARTHROPLASTY Right 10/25/2020   Procedure: RIGHT TOTAL KNEE ARTHROPLASTY;  Surgeon: Thornton Park, MD;  Location: [facility];  Service: Orthopedics;  Laterality: Right;    Current Outpatient Medications  Medication Sig Dispense Refill   ALPRAZolam (XANAX) 0.5 MG tablet Take 1 tablet by mouth twice daily as needed for anxiety 60 tablet 2   DUPIXENT 300 MG/2ML SOPN      ezetimibe (ZETIA) 10 MG tablet TAKE 1 TABLET BY MOUTH  DAILY 90 tablet 3   furosemide (LASIX) 20 MG tablet TAKE 1 TABLET BY MOUTH  DAILY AS NEEDED FOR  SWELLING/FLUID  RETENTION 90 tablet 3   glucose blood (ACCU-CHEK GUIDE) test strip Use to check blood sugars once daily. Dx Code: E11.9 100 each 3   hyoscyamine (LEVSIN SL) 0.125 MG SL tablet Take 1 tablet (0.125 mg total) by mouth every 4 (four) hours as needed. 30 tablet 0   isosorbide mononitrate (IMDUR) 30 MG 24 hr tablet TAKE 1 TABLET BY MOUTH DAILY 90 tablet 0   levothyroxine (SYNTHROID) 112 MCG tablet TAKE 1 TABLET BY MOUTH  DAILY BEFORE BREAKFAST 90 tablet 3   meclizine (ANTIVERT) 25 MG tablet TAKE 1 TABLET BY MOUTH THREE TIMES DAILY AS NEEDED FOR VERTIGO 30 tablet 0   omeprazole (PRILOSEC) 40 MG capsule TAKE 1 CAPSULE BY MOUTH  TWICE DAILY 180 capsule 3   potassium chloride (KLOR-CON) 10 MEQ tablet Take 1 tablet (10 mEq total) by mouth daily as needed (with lasix). 30 tablet 3   rosuvastatin (CRESTOR) 20 MG tablet Take 1 tablet (20 mg total)  by mouth every other day. 90 tablet 1   sertraline (ZOLOFT) 100 MG tablet TAKE 2 TABLETS BY MOUTH  DAILY 180 tablet 3   No current facility-administered medications for this visit.   Facility-Administered Medications Ordered in Other Visits  Medication Dose Route Frequency Provider Last Rate Last Admin   clindamycin (CLEOCIN) IVPB 600 mg  600 mg Intravenous Once Thornton Park, MD        Allergies as of 12/17/2021 - Review Complete 12/17/2021  Allergen Reaction Noted   Codeine Nausea And Vomiting 09/12/2010   Fentanyl Nausea And Vomiting 09/12/2010    Family History  Problem Relation Age of Onset   Heart attack Mother    Alcohol abuse Mother    Mental illness Mother        died in her 65's of alzheimer's Dementia   Heart disease Mother 42   Heart attack Father    Heart disease Father 30       AMI - died @ 34.   Colon cancer Paternal Grandmother 22   Esophageal cancer Neg Hx    Stomach cancer Neg Hx    Colon polyps Neg Hx     Social History   Socioeconomic History   Marital status: Married    Spouse name: [name]   Number of children: 1   Years of education: Not on file   Highest education level: Not on file  Occupational History   Not on file  Tobacco Use   Smoking status: Never   Smokeless tobacco: Never  Vaping Use   Vaping Use: Never used  Substance and Sexual Activity   Alcohol use: Yes    Comment: wine once a week   Drug use: No   Sexual activity: Never  Other Topics Concern   Not on file  Social History Narrative   Lives in [place].  Active but doesn't exercise. Retired from [employer].   Social Determinants of Health   Financial Resource Strain: Low Risk  (10/24/2021)   Overall Financial Resource Strain (CARDIA)    Difficulty of Paying Living Expenses: Not hard at all  Food Insecurity: No Food Insecurity (10/24/2021)   Hunger Vital Sign    Worried About Running Out of Food in the Last Year: Never true    Ran Out of Food in the Last Year: Never true   Transportation Needs: No Transportation Needs (10/24/2021)   PRAPARE - Hydrologist (Medical): No    Lack of Transportation (Non-Medical): No  Physical Activity: Inactive (  06/18/2018)   Exercise Vital Sign    Days of Exercise per Week: 0 days    Minutes of Exercise per Session: 0 min  Stress: No Stress Concern Present (10/24/2021)   Reading    Feeling of Stress : Not at all  Social Connections: Unknown (10/24/2021)   Social Connection and Isolation Panel [NHANES]    Frequency of Communication with Friends and Family: More than three times a week    Frequency of Social Gatherings with Friends and Family: More than three times a week    Attends Religious Services: Not on file    Active Member of Clubs or Organizations: Not on file    Attends Archivist Meetings: Not on file    Marital Status: Married  Intimate Partner Violence: Not At Risk (10/24/2021)   Humiliation, Afraid, Rape, and Kick questionnaire    Fear of Current or Ex-Partner: No    Emotionally Abused: No    Physically Abused: No    Sexually Abused: No    Review of Systems: 12 system ROS is negative except as noted above with the addition of cough, itching, and lower extremity swelling.   Physical Exam: General:   Alert,  well-nourished, pleasant and cooperative in NAD Head:  Normocephalic and atraumatic. Eyes:  Sclera clear, no icterus.   Conjunctiva pink. Ears:  Normal auditory acuity. Nose:  No deformity, discharge,  or lesions. Mouth:  No deformity or lesions.   Neck:  Supple; no masses or thyromegaly. Lungs:  Clear throughout to auscultation.   No wheezes. Heart:  Regular rate and rhythm; no murmurs. Abdomen:  Soft, nontender, nondistended, normal bowel sounds, no rebound or guarding. No hepatosplenomegaly.   Rectal:  Deferred  Msk:  Symmetrical. No boney deformities LAD: No inguinal or umbilical  LAD Extremities:  No clubbing or edema. Neurologic:  Alert and  oriented x4;  grossly nonfocal Skin:  Intact without significant lesions or rashes. Psych:  Alert and cooperative. Normal mood and affect.  I spent over 45 minutes, including in depth chart review, independent review of results, communicating results with the patient directly, face-to-face time with the patient, coordinating care, ordering studies and medications as appropriate, and documentation.     Livy Ross L. Tarri Glenn, MD, MPH 12/17/2021, 8:43 AM

## 2021-12-23 ENCOUNTER — Ambulatory Visit
Admission: RE | Admit: 2021-12-23 | Discharge: 2021-12-23 | Disposition: A | Discharge status: Home / Self Care | Payer: Medicare Other | Source: Ambulatory Visit | Attending: Gastroenterology | Admitting: Gastroenterology

## 2021-12-23 ENCOUNTER — Other Ambulatory Visit: Payer: Self-pay | Admitting: Gastroenterology

## 2021-12-23 DIAGNOSIS — R933 Abnormal findings on diagnostic imaging of other parts of digestive tract: Secondary | ICD-10-CM | POA: Insufficient documentation

## 2021-12-23 DIAGNOSIS — R1310 Dysphagia, unspecified: Secondary | ICD-10-CM

## 2021-12-23 DIAGNOSIS — K219 Gastro-esophageal reflux disease without esophagitis: Secondary | ICD-10-CM | POA: Diagnosis not present

## 2022-01-06 ENCOUNTER — Ambulatory Visit: Payer: Medicare Other | Admitting: Cardiovascular Disease

## 2022-01-12 ENCOUNTER — Encounter: Payer: Self-pay | Admitting: Internal Medicine

## 2022-01-13 ENCOUNTER — Other Ambulatory Visit: Payer: Self-pay | Admitting: Internal Medicine

## 2022-01-13 MED ORDER — OMEPRAZOLE 40 MG PO CPDR: 40.0000 mg | DELAYED_RELEASE_CAPSULE | Freq: Two times a day (BID) | ORAL | 3 refills | Status: DC

## 2022-01-22 DIAGNOSIS — C44319 Basal cell carcinoma of skin of other parts of face: Secondary | ICD-10-CM | POA: Diagnosis not present

## 2022-01-23 ENCOUNTER — Encounter: Payer: Self-pay | Admitting: Gastroenterology

## 2022-01-23 DIAGNOSIS — R1310 Dysphagia, unspecified: Secondary | ICD-10-CM

## 2022-01-27 ENCOUNTER — Other Ambulatory Visit: Payer: Self-pay

## 2022-01-27 DIAGNOSIS — R1314 Dysphagia, pharyngoesophageal phase: Secondary | ICD-10-CM | POA: Diagnosis not present

## 2022-01-27 DIAGNOSIS — K225 Diverticulum of esophagus, acquired: Secondary | ICD-10-CM | POA: Diagnosis not present

## 2022-01-27 MED ORDER — ACCU-CHEK GUIDE VI STRP: ORAL_STRIP | 3 refills | Status: DC

## 2022-01-29 ENCOUNTER — Encounter: Payer: Self-pay | Admitting: Internal Medicine

## 2022-01-29 ENCOUNTER — Other Ambulatory Visit: Payer: Self-pay | Admitting: Cardiovascular Disease

## 2022-01-29 ENCOUNTER — Ambulatory Visit (INDEPENDENT_AMBULATORY_CARE_PROVIDER_SITE_OTHER): Payer: Medicare Other | Admitting: Internal Medicine

## 2022-01-29 VITALS — BP 118/70 | HR 66 | Temp 98.0°F | Ht 65.0 in | Wt 142.6 lb

## 2022-01-29 DIAGNOSIS — R296 Repeated falls: Secondary | ICD-10-CM | POA: Diagnosis not present

## 2022-01-29 DIAGNOSIS — E1142 Type 2 diabetes mellitus with diabetic polyneuropathy: Secondary | ICD-10-CM | POA: Diagnosis not present

## 2022-01-29 DIAGNOSIS — Z79899 Other long term (current) drug therapy: Secondary | ICD-10-CM | POA: Diagnosis not present

## 2022-01-29 DIAGNOSIS — D472 Monoclonal gammopathy: Secondary | ICD-10-CM

## 2022-01-29 DIAGNOSIS — E032 Hypothyroidism due to medicaments and other exogenous substances: Secondary | ICD-10-CM | POA: Diagnosis not present

## 2022-01-29 DIAGNOSIS — E1169 Type 2 diabetes mellitus with other specified complication: Secondary | ICD-10-CM | POA: Diagnosis not present

## 2022-01-29 DIAGNOSIS — R2689 Other abnormalities of gait and mobility: Secondary | ICD-10-CM

## 2022-01-29 DIAGNOSIS — E785 Hyperlipidemia, unspecified: Secondary | ICD-10-CM | POA: Diagnosis not present

## 2022-01-29 DIAGNOSIS — K2289 Other specified disease of esophagus: Secondary | ICD-10-CM

## 2022-01-29 DIAGNOSIS — L281 Prurigo nodularis: Secondary | ICD-10-CM

## 2022-01-29 DIAGNOSIS — C4492 Squamous cell carcinoma of skin, unspecified: Secondary | ICD-10-CM | POA: Diagnosis not present

## 2022-01-29 LAB — LIPID PANEL
Cholesterol: 136 mg/dL (ref 0–200)
HDL: 73.7 mg/dL (ref >39.00)
LDL Cholesterol: 55 mg/dL (ref 0–99)
NonHDL: 62.78
Total CHOL/HDL Ratio: 2
Triglycerides: 41 mg/dL (ref 0.0–149.0)
VLDL: 8.2 mg/dL (ref 0.0–40.0)

## 2022-01-29 LAB — COMPREHENSIVE METABOLIC PANEL
ALT: 13 U/L (ref 0–35)
AST: 17 U/L (ref 0–37)
Albumin: 4 g/dL (ref 3.5–5.2)
Alkaline Phosphatase: 71 U/L (ref 39–117)
BUN: 20 mg/dL (ref 6–23)
CO2: 25 mEq/L (ref 19–32)
Calcium: 9.2 mg/dL (ref 8.4–10.5)
Chloride: 104 mEq/L (ref 96–112)
Creatinine, Ser: 0.86 mg/dL (ref 0.40–1.20)
GFR: 64.2 mL/min (ref >60.00)
Glucose, Bld: 75 mg/dL (ref 70–99)
Potassium: 3.8 mEq/L (ref 3.5–5.1)
Sodium: 140 mEq/L (ref 135–145)
Total Bilirubin: 0.5 mg/dL (ref 0.2–1.2)
Total Protein: 7.1 g/dL (ref 6.0–8.3)

## 2022-01-29 LAB — CBC WITH DIFFERENTIAL/PLATELET
Basophils Absolute: 0 10*3/uL (ref 0.0–0.1)
Basophils Relative: 0.7 % (ref 0.0–3.0)
Eosinophils Absolute: 0.1 10*3/uL (ref 0.0–0.7)
Eosinophils Relative: 2.1 % (ref 0.0–5.0)
HCT: 33.9 % — ABNORMAL LOW (ref 36.0–46.0)
Hemoglobin: 11.3 g/dL — ABNORMAL LOW (ref 12.0–15.0)
Lymphocytes Relative: 17.3 % (ref 12.0–46.0)
Lymphs Abs: 1.2 10*3/uL (ref 0.7–4.0)
MCHC: 33.4 g/dL (ref 30.0–36.0)
MCV: 90 fl (ref 78.0–100.0)
Monocytes Absolute: 0.4 10*3/uL (ref 0.1–1.0)
Monocytes Relative: 5.5 % (ref 3.0–12.0)
Neutro Abs: 5.1 10*3/uL (ref 1.4–7.7)
Neutrophils Relative %: 74.4 % (ref 43.0–77.0)
Platelets: 141 10*3/uL — ABNORMAL LOW (ref 150.0–400.0)
RBC: 3.77 Mil/uL — ABNORMAL LOW (ref 3.87–5.11)
RDW: 19 % — ABNORMAL HIGH (ref 11.5–15.5)
WBC: 6.8 10*3/uL (ref 4.0–10.5)

## 2022-01-29 LAB — HEMOGLOBIN A1C: Hgb A1c MFr Bld: 7.2 % — ABNORMAL HIGH (ref 4.6–6.5)

## 2022-01-29 LAB — TSH: TSH: 1.54 u[IU]/mL (ref 0.35–5.50)

## 2022-01-29 NOTE — Patient Instructions (Signed)
  I would definitely get that tooth dealt with BEFORE your [place] trip  I would also plan to get your COVID and flu vaccines prior to the trip as well (but not before your Endoscopy)

## 2022-01-29 NOTE — Progress Notes (Unsigned)
Subjective:  Patient ID: Megan Bradley, female    DOB: 1940-12-23  Age: 81 y.o. MRN: [id number]  CC: There were no encounter diagnoses.   HPI Megan Bradley presents for  Chief Complaint  Patient presents with   Follow-up   1) Dyshpagia:  saw ENT yesterday for follow up on 6 mm Zencker's diverticulum noted on DG esophagus done in July by Dr Modena Nunnery.  Saw Dr Richardson Landry who confirmed it but advised against surgically corrected.     2) Dysphagia:  scheduled  EGD is August 28 . Disorered swallowing and spasms were noted on the barium swallow ,  but no stricture.    3) nutcracker  managed  with  imdur and hyoscyamine    2) T2DM:    due for a1c  thinks it has gone up compared to in May . CBGS have ranged from 57 fasting  to 140 . Taking rosuvastatin every other day   3) Right knee doing fine post replacement .   4)  Prurigo nodularis  Now improved since starting therapy with  Dupixent , every 2 weeks self injection.  5) Basal cell CA removed from left temple  1 week ago  by  dasher . Clean margins   6( Poor balance: several indoor falls.  Discussed Vestibular PT.Marland Kitchen   7) left sided low back pain at base of ribcage posterolaterally relieved with advil and  with movement.  Has been less active physically pain localizes to the top of left hip   8) I "DO ' have anxiety issues.   9) right sided tooth decay   Outpatient Medications Prior to Visit  Medication Sig Dispense Refill   ALPRAZolam (XANAX) 0.5 MG tablet Take 1 tablet by mouth twice daily as needed for anxiety 60 tablet 2   DUPIXENT 300 MG/2ML SOPN      ezetimibe (ZETIA) 10 MG tablet TAKE 1 TABLET BY MOUTH  DAILY 90 tablet 3   furosemide (LASIX) 20 MG tablet TAKE 1 TABLET BY MOUTH  DAILY AS NEEDED FOR  SWELLING/FLUID RETENTION 90 tablet 3   glucose blood (ACCU-CHEK GUIDE) test strip Use to check blood sugars once daily. Dx Code: E11.9 100 each 3   hyoscyamine (LEVSIN SL) 0.125 MG SL tablet DISSOLVE 1 TABLET IN MOUTH EVERY 4 HOURS AS  NEEDED 30 tablet 0   isosorbide mononitrate (IMDUR) 30 MG 24 hr tablet TAKE 1 TABLET BY MOUTH DAILY 90 tablet 0   levothyroxine (SYNTHROID) 112 MCG tablet TAKE 1 TABLET BY MOUTH  DAILY BEFORE BREAKFAST 90 tablet 3   meclizine (ANTIVERT) 25 MG tablet TAKE 1 TABLET BY MOUTH THREE TIMES DAILY AS NEEDED FOR VERTIGO 30 tablet 0   omeprazole (PRILOSEC) 40 MG capsule Take 1 capsule (40 mg total) by mouth 2 (two) times daily. 180 capsule 3   potassium chloride (KLOR-CON) 10 MEQ tablet Take 1 tablet (10 mEq total) by mouth daily as needed (with lasix). 30 tablet 3   rosuvastatin (CRESTOR) 20 MG tablet Take 1 tablet (20 mg total) by mouth every other day. 90 tablet 1   sertraline (ZOLOFT) 100 MG tablet TAKE 2 TABLETS BY MOUTH  DAILY 180 tablet 3   Facility-Administered Medications Prior to Visit  Medication Dose Route Frequency Provider Last Rate Last Admin   clindamycin (CLEOCIN) IVPB 600 mg  600 mg Intravenous Once Thornton Park, MD        Review of Systems;  Patient denies headache, fevers, malaise, unintentional weight loss, skin rash, eye pain,  sinus congestion and sinus pain, sore throat, dysphagia,  hemoptysis , cough, dyspnea, wheezing, chest pain, palpitations, orthopnea, edema, abdominal pain, nausea, melena, diarrhea, constipation, flank pain, dysuria, hematuria, urinary  Frequency, nocturia, numbness, tingling, seizures,  Focal weakness, Loss of consciousness,  Tremor, insomnia, depression, anxiety, and suicidal ideation.      Objective:  BP 118/70 (BP Location: Left Arm, Patient Position: Sitting, Cuff Size: Normal)   Pulse 66   Temp 98 F (36.7 C) (Oral)   Ht '5\' 5"'$  (1.651 m)   Wt 142 lb 9.6 oz (64.7 kg)   SpO2 99%   BMI 23.73 kg/m   BP Readings from Last 3 Encounters:  01/29/22 118/70  12/17/21 118/70  10/29/21 132/68    Wt Readings from Last 3 Encounters:  01/29/22 142 lb 9.6 oz (64.7 kg)  12/17/21 139 lb (63 kg)  10/29/21 141 lb 3.2 oz (64 kg)    General  appearance: alert, cooperative and appears stated age Ears: normal TM's and external ear canals both ears Throat: lips, mucosa, and tongue normal; teeth and gums normal Neck: no adenopathy, no carotid bruit, supple, symmetrical, trachea midline and thyroid not enlarged, symmetric, no tenderness/mass/nodules Back: symmetric, no curvature. ROM normal. No CVA tenderness. Lungs: clear to auscultation bilaterally Heart: regular rate and rhythm, S1, S2 normal, no murmur, click, rub or gallop Abdomen: soft, non-tender; bowel sounds normal; no masses,  no organomegaly Pulses: 2+ and symmetric Skin: Skin color, texture, turgor normal. No rashes or lesions Lymph nodes: Cervical, supraclavicular, and axillary nodes normal.  Lab Results  Component Value Date   HGBA1C 6.4 (A) 10/29/2021   HGBA1C 6.9 (H) 07/30/2021   HGBA1C 6.3 12/13/2020    Lab Results  Component Value Date   CREATININE 0.96 07/30/2021   CREATININE 0.89 05/14/2021   CREATININE 0.86 01/16/2021    Lab Results  Component Value Date   WBC 5.4 07/30/2021   HGB 11.1 (L) 07/30/2021   HCT 34.0 (L) 07/30/2021   PLT 131.0 (L) 07/30/2021   GLUCOSE 99 07/30/2021   CHOL 136 07/30/2021   TRIG 45.0 07/30/2021   HDL 71.90 07/30/2021   LDLDIRECT 93.0 01/02/2016   LDLCALC 55 07/30/2021   ALT 17 07/30/2021   AST 21 07/30/2021   NA 139 07/30/2021   K 4.3 07/30/2021   CL 103 07/30/2021   CREATININE 0.96 07/30/2021   BUN 20 07/30/2021   CO2 33 (H) 07/30/2021   TSH 5.08 12/13/2020   INR 1.0 09/26/2020   HGBA1C 6.4 (A) 10/29/2021   MICROALBUR 1.0 07/30/2021    DG ESOPHAGUS W DOUBLE CM (HD)  Result Date: 12/23/2021 CLINICAL DATA:  Dysphagia EXAM: ESOPHAGUS/BARIUM SWALLOW/TABLET STUDY TECHNIQUE: Combined double and single contrast examination was performed using effervescent crystals, high-density barium, and thin liquid barium. This exam was performed by APP name, and was supervised and interpreted by Rad name. FLUOROSCOPY: Radiation  Exposure Index (as provided by the fluoroscopic device): 15.5 mGy COMPARISON:  None Available. FINDINGS: Normal pharyngeal anatomy and motility. Contrast flowed freely through the esophagus without evidence of a stricture or mass. Normal esophageal mucosa without evidence of irregularity or ulceration. 6 mm Zenker's diverticulum. Mild gastroesophageal reflux. Tertiary contractions of the esophagus as can be seen with presbyesophagus versus mild spasm. No definite hiatal hernia was demonstrated. At the end of the examination a 13 mm barium tablet was administered which transited through the esophagus and esophagogastric junction without delay. IMPRESSION: 1.  6 mm Zenker's diverticulum. 2. Mild gastroesophageal reflux. 3. Tertiary contractions of the  esophagus as can be seen with presbyesophagus versus mild spasm. Electronically Signed   By: Kathreen Devoid M.D.   On: 12/23/2021 09:34    Assessment & Plan:   Problem List Items Addressed This Visit   None   I spent a total of   minutes with this patient in a face to face visit on the date of this encounter reviewing the last office visit with me on        ,  most recent with patient's cardiologist in    ,  patient'ss diet and eating habits, home blood pressure readings ,  most recent imaging study ,   and post visit ordering of testing and therapeutics.    Follow-up: No follow-ups on file.   Crecencio Mc, MD

## 2022-01-30 DIAGNOSIS — K2289 Other specified disease of esophagus: Secondary | ICD-10-CM | POA: Insufficient documentation

## 2022-01-30 NOTE — Assessment & Plan Note (Signed)
Resulting in dysphagia complicated by ZD and nutracker esophagus.  Lifestyle modifications reinforced.  For EGD next week

## 2022-01-30 NOTE — Assessment & Plan Note (Signed)
Removed from left temple August 2023 ([facility]).  She is avoiding the sun religiously

## 2022-01-30 NOTE — Assessment & Plan Note (Signed)
Improving skin texture and symptoms with use of Dupixent

## 2022-01-30 NOTE — Assessment & Plan Note (Addendum)
Reasonable control for age without medications.  Continue QOD statin .  No microalbuminuria.  Poor balance is in part due to diabetes but aggravated by deconditioning and proximal muscle weakness.  Vestibular rehab offered   Lab Results  Component Value Date   HGBA1C 7.2 (H) 01/29/2022   Lab Results  Component Value Date   MICROALBUR 1.0 07/30/2021   MICROALBUR <0.7 01/16/2021

## 2022-01-30 NOTE — Assessment & Plan Note (Signed)
Diagnosed during workup of persistent normocytic anemia.  Continue follow up with Dr Janese Banks

## 2022-01-30 NOTE — Assessment & Plan Note (Signed)
Thyroid function is WNL on current dose.  No current changes needed.   Lab Results  Component Value Date   TSH 1.54 01/29/2022

## 2022-02-03 ENCOUNTER — Telehealth: Payer: Self-pay | Admitting: *Deleted

## 2022-02-03 ENCOUNTER — Encounter: Payer: Self-pay | Admitting: Gastroenterology

## 2022-02-03 ENCOUNTER — Ambulatory Visit: Payer: Medicare Other | Admitting: Gastroenterology

## 2022-02-03 VITALS — BP 120/64 | HR 72 | Temp 97.3°F | Ht 65.0 in | Wt 139.0 lb

## 2022-02-03 MED ORDER — SODIUM CHLORIDE 0.9 % IV SOLN: 500.0000 mL | INTRAVENOUS | Status: DC

## 2022-02-03 NOTE — Telephone Encounter (Signed)
PT's procedure canceled for today due to Dr. Tarri Glenn being ill. PT rescheduled and new EGD instructions reviewed with patient.

## 2022-02-03 NOTE — Progress Notes (Unsigned)
Procedure cancelled d/t physician falling ill.  Patient will be rescheduled.  Patient informed. SChaplin, RN,BSN

## 2022-02-07 DIAGNOSIS — M5416 Radiculopathy, lumbar region: Secondary | ICD-10-CM | POA: Diagnosis not present

## 2022-02-11 ENCOUNTER — Encounter: Payer: Self-pay | Admitting: Internal Medicine

## 2022-02-12 ENCOUNTER — Other Ambulatory Visit: Payer: Self-pay | Admitting: Internal Medicine

## 2022-02-19 ENCOUNTER — Encounter: Payer: Self-pay | Admitting: Gastroenterology

## 2022-02-20 ENCOUNTER — Encounter: Payer: Self-pay | Admitting: Gastroenterology

## 2022-02-20 ENCOUNTER — Telehealth: Payer: Self-pay | Admitting: Gastroenterology

## 2022-02-20 NOTE — Telephone Encounter (Signed)
Inbound call from patient stating she received a letter for pre certification for a colon. Patient is currently scheduled for an EGD. Patient is a little confused. Please advise.  Thank you

## 2022-02-27 ENCOUNTER — Encounter: Payer: Self-pay | Admitting: Certified Registered Nurse Anesthetist

## 2022-03-06 ENCOUNTER — Encounter: Payer: Self-pay | Admitting: Gastroenterology

## 2022-03-06 ENCOUNTER — Ambulatory Visit (AMBULATORY_SURGERY_CENTER): Payer: Medicare Other | Admitting: Gastroenterology

## 2022-03-06 VITALS — BP 133/67 | HR 71 | Temp 97.1°F | Resp 15 | Ht 65.0 in | Wt 139.0 lb

## 2022-03-06 DIAGNOSIS — K225 Diverticulum of esophagus, acquired: Secondary | ICD-10-CM

## 2022-03-06 DIAGNOSIS — K2289 Other specified disease of esophagus: Secondary | ICD-10-CM | POA: Diagnosis not present

## 2022-03-06 DIAGNOSIS — K2100 Gastro-esophageal reflux disease with esophagitis, without bleeding: Secondary | ICD-10-CM | POA: Diagnosis not present

## 2022-03-06 DIAGNOSIS — R1310 Dysphagia, unspecified: Secondary | ICD-10-CM

## 2022-03-06 DIAGNOSIS — K317 Polyp of stomach and duodenum: Secondary | ICD-10-CM | POA: Diagnosis not present

## 2022-03-06 DIAGNOSIS — K219 Gastro-esophageal reflux disease without esophagitis: Secondary | ICD-10-CM

## 2022-03-06 DIAGNOSIS — K31819 Angiodysplasia of stomach and duodenum without bleeding: Secondary | ICD-10-CM

## 2022-03-06 MED ORDER — SODIUM CHLORIDE 0.9 % IV SOLN: 500.0000 mL | Freq: Once | INTRAVENOUS | Status: DC

## 2022-03-06 NOTE — Patient Instructions (Signed)
Please read handouts provided. Continue present medications. Await pathology results. Follow-up with ENT.   YOU HAD AN ENDOSCOPIC PROCEDURE TODAY AT [facility]:   Refer to the procedure report that was given to you for any specific questions about what was found during the examination.  If the procedure report does not answer your questions, please call your gastroenterologist to clarify.  If you requested that your care partner not be given the details of your procedure findings, then the procedure report has been included in a sealed envelope for you to review at your convenience later.  YOU SHOULD EXPECT: Some feelings of bloating in the abdomen. Passage of more gas than usual.  Walking can help get rid of the air that was put into your GI tract during the procedure and reduce the bloating. If you had a lower endoscopy (such as a colonoscopy or flexible sigmoidoscopy) you may notice spotting of blood in your stool or on the toilet paper. If you underwent a bowel prep for your procedure, you may not have a normal bowel movement for a few days.  Please Note:  You might notice some irritation and congestion in your nose or some drainage.  This is from the oxygen used during your procedure.  There is no need for concern and it should clear up in a day or so.  SYMPTOMS TO REPORT IMMEDIATELY:  Following upper endoscopy (EGD)  Vomiting of blood or coffee ground material  New chest pain or pain under the shoulder blades  Painful or persistently difficult swallowing  New shortness of breath  Fever of 100F or higher  Black, tarry-looking stools  For urgent or emergent issues, a gastroenterologist can be reached at any hour by calling [phone]. Do not use MyChart messaging for urgent concerns.    DIET:  We do recommend a small meal at first, but then you may proceed to your regular diet.  Drink plenty of fluids but you should avoid alcoholic beverages for 24  hours.  ACTIVITY:  You should plan to take it easy for the rest of today and you should NOT DRIVE or use heavy machinery until tomorrow (because of the sedation medicines used during the test).    FOLLOW UP: Our staff will call the number listed on your records the next business day following your procedure.  We will call around 7:15- 8:00 am to check on you and address any questions or concerns that you may have regarding the information given to you following your procedure. If we do not reach you, we will leave a message.     If any biopsies were taken you will be contacted by phone or by letter within the next 1-3 weeks.  Please call us at [phone] if you have not heard about the biopsies in 3 weeks.    SIGNATURES/CONFIDENTIALITY: You and/or your care partner have signed paperwork which will be entered into your electronic medical record.  These signatures attest to the fact that that the information above on your After Visit Summary has been reviewed and is understood.  Full responsibility of the confidentiality of this discharge information lies with you and/or your care-partner.

## 2022-03-06 NOTE — Op Note (Addendum)
[place] Patient Name: Megan Bradley Procedure Date: 03/06/2022 9:37 AM MRN: [id number] Endoscopist: Thornton Park MD, MD Age: 81 Referring MD:  Date of Birth: 1941-04-15 Gender: Female Account #: [id number] Procedure:                Upper GI endoscopy Indications:              Dysphagia, Chest pain (non cardiac) Medicines:                Monitored Anesthesia Care Procedure:                Pre-Anesthesia Assessment:                           - Prior to the procedure, a History and Physical                            was performed, and patient medications and                            allergies were reviewed. The patient's tolerance of                            previous anesthesia was also reviewed. The risks                            and benefits of the procedure and the sedation                            options and risks were discussed with the patient.                            All questions were answered, and informed consent                            was obtained. Prior Anticoagulants: The patient has                            taken no previous anticoagulant or antiplatelet                            agents. ASA Grade Assessment: II - A patient with                            mild systemic disease. After reviewing the risks                            and benefits, the patient was deemed in                            satisfactory condition to undergo the procedure.                           After obtaining informed consent, the endoscope was  passed under direct vision. Throughout the                            procedure, the patient's blood pressure, pulse, and                            oxygen saturations were monitored continuously. The                            GIF [id number] #[id number] was introduced through the                            mouth, and advanced to the third part of duodenum.                            The upper GI endoscopy  was accomplished without                            difficulty. The patient tolerated the procedure                            well. Scope In: Scope Out: Findings:                 A non-bleeding Zenker's diverticulum with a small                            opening, no impacted food and no stigmata of recent                            bleeding was found.                           No other endoscopic abnormality was evident in the                            esophagus to explain the patient's complaint of                            dysphagia. There is a slightly ringed appeance to                            the lumen in the distal esophagus but this is very                            subtle and of unclear clinical significance.                            Biopsies were obtained from the mid/proximal and                            distal esophagus with cold forceps for histology of  suspected eosinophilic esophagitis. Estimated blood                            loss was minimal.                           Patchy mildly erythematous mucosa without bleeding                            was found in the gastric body. Biopsies were taken                            from the antrum, body, and fundus with a cold                            forceps for histology. Estimated blood loss was                            minimal.                           Multiple small sessile polyps were found in the                            gastric body. Biopsies were taken with a cold                            forceps for histology. Estimated blood loss was                            minimal.                           Lymphangiectasia was present in the second portion                            of the duodenum. Biopsies were taken with a cold                            forceps for histology. Estimated blood loss was                            minimal.                           The cardia and  gastric fundus were normal on                            retroflexion.                           The exam was otherwise without abnormality. Complications:            No immediate complications. Estimated Blood Loss:     Estimated blood loss was minimal. Impression:               -  Zenker's diverticulum. The likely source of                            dysphagia.                           - No other endoscopic esophageal abnormality to                            explain patient's dysphagia. Biopsied.                           - Multiple gastric polyps. Likely fundic gland                            polyps. Biopsied.                           - Erythematous mucosa in the gastric body. Biopsied.                           - Duodenal mucosal lymphangiectasia.                           - The examination was otherwise normal. Recommendation:           - Patient has a contact number available for                            emergencies. The signs and symptoms of potential                            delayed complications were discussed with the                            patient. Return to normal activities tomorrow.                            Written discharge instructions were provided to the                            patient.                           - Resume previous diet.                           - Continue present medications.                           - Await pathology results.                           - Follow-up with ENT re: surgical options for                            Zenker's. I would be happy to refer you to an  academic center in our region to explore additional                            options. Thornton Park MD, MD 03/06/2022 10:16:56 AM This report has been signed electronically.

## 2022-03-06 NOTE — Progress Notes (Signed)
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    - Previously treated with diltiazem, Levsin, nitroglycerin and Dexilant    - Currently on omeprazole 40 mg BID and Levsin PRN    - Plan repeat EGD to exclude concurrent diagnoses   54m Zenker's diverticulum seen on esophagram 12/2021    - [place] Zenker[place] contributing   History of colon polyps    - 6 tubular adenomas removed on colonoscopy 2019    - surveillance discussed but she does not want to proceed given age   Appropriate candidate for monitored anesthesia care  [place]   HPI: Megan DHINGRAis a 81y.o. female presents for endoscopic evaluation of atypical chest pain and progressive dysphagia. She has a longstanding history of dysphagia but she feels that the dysphagia is worse over the last several months.  Current complaints include coughing with eating both solids and liquids, trouble clearing throat of pills and food, frequent regurgitation of undigested food, pain with swallowing meats, atypical chest pain. She has recently had some dysphonia.  No sore throat, neck pain, blood in the stool or changes in bowel habits. Appetite is good. Weight is stable.    Currently manages her symptoms using Levsin PRN in an attempt to abort her symptoms in addition to omeprazole 40 mg BID.    Previously evaluated by cardiology. Previously treated with diltiazem which was discontinued as Dr. GRockey Situthought this may be the cause of her lower extremity edema.  He switched her to isosorbide. Unclear if discontinuing this medication actually improved her edema as it has recurred.    [facility] pharyngoesophageal dysphagia  with small Zenker's. He wasn't sure how much the Zenker's was contributing to her symptoms. He was going to review with Dr. VPryor Ochoato consider endoscopic management    Prior evaluation includes: - Esophagram 03/04/2016 was normal - High-resolution impedance manometry 07/01/2016 interpreted by Dr. WVerl Blalockshowed DCI pressures above 8000 and greater than 20% of contractions consistent with jackhammer esophagus. - EGD 04/05/2017 showed multiple fundic gland polyps but was otherwise normal biopsies were negative for H. pylori.  No esophageal biopsies performed. - Functional swallow study 11/22/2021 for dysphagia showed no aspiration but suggested a small Zenker's diverticulum.  Oral stasis of boluses were noted in the upper esophagus with solids.  Speech pathology recommended the patient take a sip of liquid between boluses. - Esophagram 12/23/21: 6 mm Zenker's diverticulum, mild gastroesophageal reflux, tertiary contractions of the esophagus - Followed by Dr. GRockey Situwith chest pain felt to be due to esophageal spasm    Past Medical History:  Diagnosis Date   Anxiety    Anxiety disorder    [place]    Arthritis    both knees   Breast disorder in female 10/14/2019   Cervical dysplasia    a. 2010 - high grade squamous intraepithelial lesion s/p excision.   Chest pain    a. 2006 or 2007 Cath ([facility]: reportedly nl cors;  b. 09/2011 ETT: nl.   DOE (dyspnea on exertion)    Family history of adverse reaction to anesthesia    grand daughter was hard to wake up after back surgery  GERD (gastroesophageal reflux disease)    Grade II diastolic dysfunction    Herpes zoster    Hypothyroidism    Mild aortic regurgitation    Nutcracker esophagus    Orthostatic hypotension    Pneumonia    Pre-diabetes    Ruptured left breast implant, initial encounter 07/05/2018   Sleep apnea    does not use cpap, did not tolerate   Vertigo    Wears dentures    partial lower    Past Surgical  History:  Procedure Laterality Date   AUGMENTATION MAMMAPLASTY Bilateral    BREAST EXCISIONAL BIOPSY Left    CARDIAC CATHETERIZATION  2003   Dr.Callwood, R/L heart cath,   COLONOSCOPY WITH PROPOFOL N/A 06/12/2017   Procedure: COLONOSCOPY WITH PROPOFOL;  Surgeon: Lucilla Lame, MD;  Location: [facility];  Service: Endoscopy;  Laterality: N/A;   dental work     ESOPHAGEAL MANOMETRY N/A 06/19/2016   Procedure: ESOPHAGEAL MANOMETRY (EM);  Surgeon: Lucilla Lame, MD;  Location: [facility];  Service: Endoscopy;  Laterality: N/A;   ESOPHAGOGASTRODUODENOSCOPY (EGD) WITH PROPOFOL N/A 04/03/2017   Procedure: ESOPHAGOGASTRODUODENOSCOPY (EGD) WITH PROPOFOL;  Surgeon: Lucilla Lame, MD;  Location: [place];  Service: Endoscopy;  Laterality: N/A;  diabetic - diet controlled   THYROIDECTOMY  05/15/15   [facility]   TONSILLECTOMY     TOTAL KNEE ARTHROPLASTY Right 10/25/2020   Procedure: RIGHT TOTAL KNEE ARTHROPLASTY;  Surgeon: Thornton Park, MD;  Location: [facility];  Service: Orthopedics;  Laterality: Right;    Current Outpatient Medications  Medication Sig Dispense Refill   ALPRAZolam (XANAX) 0.5 MG tablet Take 1 tablet by mouth twice daily as needed for anxiety 60 tablet 5   ezetimibe (ZETIA) 10 MG tablet TAKE 1 TABLET BY MOUTH  DAILY 90 tablet 3   glucose blood (ACCU-CHEK GUIDE) test strip Use to check blood sugars once daily. Dx Code: E11.9 100 each 3   isosorbide mononitrate (IMDUR) 30 MG 24 hr tablet TAKE 1 TABLET BY MOUTH DAILY 90 tablet 0   levothyroxine (SYNTHROID) 112 MCG tablet TAKE 1 TABLET BY MOUTH  DAILY BEFORE BREAKFAST 90 tablet 3   omeprazole (PRILOSEC) 40 MG capsule Take 1 capsule (40 mg total) by mouth 2 (two) times daily. 180 capsule 3   rosuvastatin (CRESTOR) 20 MG tablet Take 1 tablet (20 mg total) by mouth every other day. 90 tablet 1   sertraline (ZOLOFT) 100 MG tablet TAKE 2 TABLETS BY MOUTH  DAILY 180 tablet 3   amoxicillin (AMOXIL) 500 MG capsule SMARTSIG:4  Capsule(s) By Mouth Once (Patient not taking: Reported on 03/06/2022)     DUPIXENT 300 MG/2ML SOPN      furosemide (LASIX) 20 MG tablet TAKE 1 TABLET BY MOUTH  DAILY AS NEEDED FOR  SWELLING/FLUID RETENTION (Patient not taking: Reported on 03/06/2022) 90 tablet 3   hyoscyamine (LEVSIN SL) 0.125 MG SL tablet DISSOLVE 1 TABLET IN MOUTH EVERY 4 HOURS AS NEEDED 30 tablet 0   meclizine (ANTIVERT) 25 MG tablet TAKE 1 TABLET BY MOUTH THREE TIMES DAILY AS NEEDED FOR VERTIGO 30 tablet 0   methylPREDNISolone (MEDROL DOSEPAK) 4 MG TBPK tablet Take by mouth as directed. (Patient not taking: Reported on 03/06/2022)     potassium chloride (KLOR-CON) 10 MEQ tablet Take 1 tablet (10 mEq total) by mouth daily as needed (with lasix). 30 tablet 3   Current Facility-Administered Medications  Medication Dose Route Frequency Provider Last Rate Last Admin   0.9 %  sodium chloride infusion  500 mL  Intravenous Once Thornton Park, MD       Facility-Administered Medications Ordered in Other Visits  Medication Dose Route Frequency Provider Last Rate Last Admin   clindamycin (CLEOCIN) IVPB 600 mg  600 mg Intravenous Once Thornton Park, MD        Allergies as of 03/06/2022 - Review Complete 03/06/2022  Allergen Reaction Noted   Atorvastatin Other (See Comments) 06/14/2013   Codeine Nausea And Vomiting 09/12/2010   Fentanyl Nausea And Vomiting 09/12/2010    Family History  Problem Relation Age of Onset   Heart attack Mother    Alcohol abuse Mother    Mental illness Mother        died in her 13's of alzheimer's Dementia   Heart disease Mother 66   Heart attack Father    Heart disease Father 38       AMI - died @ 75.   Colon cancer Paternal Grandmother 47   Esophageal cancer Neg Hx    Stomach cancer Neg Hx    Colon polyps Neg Hx      Physical Exam: General:   Alert,  well-nourished, pleasant and cooperative in NAD Head:  Normocephalic and atraumatic. Eyes:  Sclera clear, no icterus.   Conjunctiva  pink. Mouth:  No deformity or lesions.   Neck:  Supple; no masses or thyromegaly. Lungs:  Clear throughout to auscultation.   No wheezes. Heart:  Regular rate and rhythm; no murmurs. Abdomen:  Soft, non-tender, nondistended, normal bowel sounds, no rebound or guarding.  Msk:  Symmetrical. No boney deformities LAD: No inguinal or umbilical LAD Extremities:  No clubbing or edema. Neurologic:  Alert and  oriented x4;  grossly nonfocal Skin:  No obvious rash or bruise. Psych:  Alert and cooperative. Normal mood and affect.     Studies/Results: No results found.    Megan Bradley L. Tarri Glenn, MD, MPH 03/06/2022, 9:43 AM

## 2022-03-06 NOTE — Progress Notes (Signed)
0950 Robinul 0.1 mg IV given due large amount of secretions upon assessment.  MD made aware, vss

## 2022-03-06 NOTE — Progress Notes (Signed)
Report given to PACU, vss 

## 2022-03-06 NOTE — Progress Notes (Signed)
VS by CW  Pt's states no medical or surgical changes since previsit or office visit.  

## 2022-03-07 ENCOUNTER — Telehealth: Payer: Self-pay

## 2022-03-07 NOTE — Telephone Encounter (Signed)
  Follow up Call-     03/06/2022    9:18 AM 02/03/2022    9:22 AM  Call back number  Post procedure Call Back phone  # [phone]  Permission to leave phone message Yes Yes     Patient questions:  Do you have a fever, pain , or abdominal swelling? Yes Pain Score  1-2  Have you tolerated food without any problems? Yes.    Have you been able to return to your normal activities? Yes.    Do you have any questions about your discharge instructions: Diet   No. Medications  No. Follow up visit  No.  Do you have questions or concerns about your Care? No.  Actions: * If pain score is 4 or above: No action needed, pain <4.

## 2022-03-10 DIAGNOSIS — H5203 Hypermetropia, bilateral: Secondary | ICD-10-CM | POA: Diagnosis not present

## 2022-03-10 DIAGNOSIS — E119 Type 2 diabetes mellitus without complications: Secondary | ICD-10-CM | POA: Diagnosis not present

## 2022-03-10 DIAGNOSIS — Z9842 Cataract extraction status, left eye: Secondary | ICD-10-CM | POA: Diagnosis not present

## 2022-03-10 DIAGNOSIS — H52223 Regular astigmatism, bilateral: Secondary | ICD-10-CM | POA: Diagnosis not present

## 2022-03-10 DIAGNOSIS — Z9841 Cataract extraction status, right eye: Secondary | ICD-10-CM | POA: Diagnosis not present

## 2022-03-10 LAB — HM DIABETES EYE EXAM

## 2022-03-16 ENCOUNTER — Other Ambulatory Visit: Payer: Self-pay | Admitting: Internal Medicine

## 2022-03-26 ENCOUNTER — Other Ambulatory Visit: Payer: Self-pay | Admitting: Cardiovascular Disease

## 2022-03-27 ENCOUNTER — Telehealth: Payer: Self-pay | Admitting: Internal Medicine

## 2022-03-27 ENCOUNTER — Encounter: Payer: Self-pay | Admitting: Gastroenterology

## 2022-03-27 NOTE — Telephone Encounter (Signed)
Okay 

## 2022-03-27 NOTE — Progress Notes (Signed)
Cardiology Office Note  Date:  03/28/2022   ID:  Megan Bradley, DOB 20-Mar-1941, MRN [id number]  PCP:  Crecencio Mc, MD   Chief Complaint  Patient presents with   6 month follow up     Patient c/o shortness of breath when bending over to tie her shoe or pick up her dog and pre-syncope  Medications reviewed by the patient verbally.     HPI:  Megan Bradley is a 81 year old woman past medical history of  syncope , symptoms improved by holding  lisinopril and HCTZ.   chronic chest pain felt secondary to spasm,  previous cardiac catheterization with no coronary disease into 2007,  " Jackhammer esophagus", spasm  she presents for routine followup of her chest pain, shortness of breath, syncope  Last seen in clinic January 2023  Doing well Reports having chronic issues with food and pills getting stuck in her throat  Diagnosed with Zenker's diverticulum  Has learned how to deal with it  Let swelling stable, better off diltiazem Feels her esophagus is stable, spasm stable on isosorbide "jackhammer esophagus", takes levsin as needed  Episode of syncope 6 weeks ago in bedroom, standing at [place], in the afternoon Happened again in bathroom  She has reported BP low at home, takes imdur in the Am At home, can get BP 100/70 Feels that she does not drink enough  Prior records reviewed Echo 4/22: normal EF 60%  She has not required Lasix  EKG personally reviewed by myself on todays visit normal sinus rhythm with rate 73 beats per minute with right bundle branch block, no other significant ST or T wave changes  Other past medical history reviewed Knee surgery 10/2020 10/25/2020 and underwent an uncomplicated right total knee arthroplasty.  Had delerium HGB 8.7, Has recovered: 11.3   chest pain 2013, normal CT scan chest, normal exercise treadmill test, Normal LV function by echocardiogram   Previous echocardiogram many years ago showed diastolic dysfunction with normal LV function     PMH:   has a past medical history of Anxiety, Anxiety disorder, Aortic atherosclerosis ([place]), Arthritis, Breast disorder in female (10/14/2019), Cervical dysplasia, Chest pain, DOE (dyspnea on exertion), Family history of adverse reaction to anesthesia, GERD (gastroesophageal reflux disease), Grade II diastolic dysfunction, Herpes zoster, Hypothyroidism, Mild aortic regurgitation, Nutcracker esophagus, Orthostatic hypotension, Pneumonia, Pre-diabetes, Ruptured left breast implant, initial encounter (07/05/2018), Sleep apnea, Vertigo, and Wears dentures.  PSH:    Past Surgical History:  Procedure Laterality Date   AUGMENTATION MAMMAPLASTY Bilateral    BREAST EXCISIONAL BIOPSY Left    CARDIAC CATHETERIZATION  2003   Dr.Callwood, R/L heart cath,   COLONOSCOPY WITH PROPOFOL N/A 06/12/2017   Procedure: COLONOSCOPY WITH PROPOFOL;  Surgeon: Lucilla Lame, MD;  Location: [facility];  Service: Endoscopy;  Laterality: N/A;   dental work     ESOPHAGEAL MANOMETRY N/A 06/19/2016   Procedure: ESOPHAGEAL MANOMETRY (EM);  Surgeon: Lucilla Lame, MD;  Location: [facility];  Service: Endoscopy;  Laterality: N/A;   ESOPHAGOGASTRODUODENOSCOPY (EGD) WITH PROPOFOL N/A 04/03/2017   Procedure: ESOPHAGOGASTRODUODENOSCOPY (EGD) WITH PROPOFOL;  Surgeon: Lucilla Lame, MD;  Location: [facility];  Service: Endoscopy;  Laterality: N/A;  diabetic - diet controlled   THYROIDECTOMY  05/15/15   [facility]   TONSILLECTOMY     TOTAL KNEE ARTHROPLASTY Right 10/25/2020   Procedure: RIGHT TOTAL KNEE ARTHROPLASTY;  Surgeon: Thornton Park, MD;  Location: [facility];  Service: Orthopedics;  Laterality: Right;    Current Outpatient Medications  Medication Sig  Dispense Refill   ALPRAZolam (XANAX) 0.5 MG tablet Take 1 tablet by mouth twice daily as needed for anxiety 60 tablet 5   DUPIXENT 300 MG/2ML SOPN      ezetimibe (ZETIA) 10 MG tablet TAKE 1 TABLET BY MOUTH  DAILY 90 tablet 3   furosemide (LASIX) 20 MG tablet TAKE 1  TABLET BY MOUTH  DAILY AS NEEDED FOR  SWELLING/FLUID RETENTION 90 tablet 3   glucose blood (ACCU-CHEK GUIDE) test strip Use to check blood sugars once daily. Dx Code: E11.9 100 each 3   hyoscyamine (LEVSIN SL) 0.125 MG SL tablet DISSOLVE 1 TABLET IN MOUTH EVERY 4 HOURS AS NEEDED 30 tablet 0   levothyroxine (SYNTHROID) 112 MCG tablet TAKE 1 TABLET BY MOUTH  DAILY BEFORE BREAKFAST 90 tablet 3   meclizine (ANTIVERT) 25 MG tablet TAKE 1 TABLET BY MOUTH THREE TIMES DAILY AS NEEDED FOR VERTIGO 30 tablet 0   omeprazole (PRILOSEC) 40 MG capsule Take 1 capsule (40 mg total) by mouth 2 (two) times daily. 180 capsule 3   potassium chloride (KLOR-CON) 10 MEQ tablet Take 1 tablet (10 mEq total) by mouth daily as needed (with lasix). 30 tablet 3   rosuvastatin (CRESTOR) 20 MG tablet Take 1 tablet (20 mg total) by mouth every other day. 90 tablet 1   sertraline (ZOLOFT) 100 MG tablet TAKE 2 TABLETS BY MOUTH DAILY 180 tablet 3   amoxicillin (AMOXIL) 500 MG capsule SMARTSIG:4 Capsule(s) By Mouth Once (Patient not taking: Reported on 03/06/2022)     isosorbide mononitrate (IMDUR) 30 MG 24 hr tablet TAKE 1 TABLET BY MOUTH DAILY 90 tablet 3   methylPREDNISolone (MEDROL DOSEPAK) 4 MG TBPK tablet Take by mouth as directed. (Patient not taking: Reported on 03/06/2022)     No current facility-administered medications for this visit.   Facility-Administered Medications Ordered in Other Visits  Medication Dose Route Frequency Provider Last Rate Last Admin   clindamycin (CLEOCIN) IVPB 600 mg  600 mg Intravenous Once Thornton Park, MD         Allergies:   Atorvastatin, Codeine, and Fentanyl   Social History:  The patient  reports that she has never smoked. She has never used smokeless tobacco. She reports current alcohol use. She reports that she does not use drugs.   Family History:   family history includes Alcohol abuse in her mother; Colon cancer (age of onset: 95) in her paternal grandmother; Heart attack in her  father and mother; Heart disease (age of onset: 68) in her father; Heart disease (age of onset: 56) in her mother; Mental illness in her mother.    Review of Systems: Review of Systems  Constitutional: Negative.   HENT: Negative.    Cardiovascular:  Positive for leg swelling.  Gastrointestinal: Negative.   Musculoskeletal: Negative.   Neurological: Negative.   Psychiatric/Behavioral: Negative.    All other systems reviewed and are negative.    PHYSICAL EXAM: VS:  BP (!) 110/56 (BP Location: Left Arm, Patient Position: Sitting, Cuff Size: Normal)   Pulse 73   Ht 5' 5.5" (1.664 m)   Wt 146 lb 4 oz (66.3 kg)   SpO2 98%   BMI 23.97 kg/m  , BMI Body mass index is 23.97 kg/m. Constitutional:  oriented to person, place, and time. No distress.  HENT:  Head: Grossly normal Eyes:  no discharge. No scleral icterus.  Neck: No JVD, no carotid bruits  Cardiovascular: Regular rate and rhythm, no murmurs appreciated No significant lower extremity edema Pulmonary/Chest: Clear  to auscultation bilaterally, no wheezes or rails Abdominal: Soft.  no distension.  no tenderness.  Musculoskeletal: Normal range of motion Neurological:  normal muscle tone. Coordination normal. No atrophy Skin: Erythematous rash on lower extremities Psychiatric: normal affect, pleasant   Recent Labs: 01/29/2022: ALT 13; BUN 20; Creatinine, Ser 0.86; Hemoglobin 11.3; Platelets 141.0; Potassium 3.8; Sodium 140; TSH 1.54    Lipid Panel Lab Results  Component Value Date   CHOL 136 01/29/2022   HDL 73.70 01/29/2022   LDLCALC 55 01/29/2022   TRIG 41.0 01/29/2022      Wt Readings from Last 3 Encounters:  03/28/22 146 lb 4 oz (66.3 kg)  03/06/22 139 lb (63 kg)  02/03/22 139 lb (63 kg)     ASSESSMENT AND PLAN:  Problem List Items Addressed This Visit       Cardiology Problems   Aortic atherosclerosis ([place]) - Primary   Relevant Orders   EKG 12-Lead     Other   Exertional dyspnea   Other Visit  Diagnoses     Chronic diastolic heart failure (HCC)       Relevant Orders   EKG 12-Lead   Mixed hyperlipidemia       Relevant Orders   EKG 12-Lead   Benign essential HTN       Relevant Orders   EKG 12-Lead     Pure hypercholesterolemia Tolerating Zetia daily Prefers to take Crestor 20 every other day Cholesterol at goal  Aortic arch atherosclerosis (HCC) Prior CT scan images no significant coronary calcifications, no calcified plaque noted in the ascending or descending aorta   Atypical chest pain Controlled with Levsin as needed, Imdur 30 daily Rare symptoms   Diabetes mellitus without complication (HCC) A9V 7.2, up from 6 range Followed by primary care  Esophageal spasm Off diltiazem secondary to leg swelling Tolerating Imdur, Levsin as needed  Syncope Etiology unclear Orthostatics negative in the office today, drop from 132 down to 916 systolic with standing.  Reports having salty chicken soup for lunch We have ordered a Zio monitor Consider moving the isosorbide to dinnertime Increase fluids in the morning Echo cardiogram last year with normal EF  Leg edema Symptoms improved by holding calcium channel blocker No significant edema on her visit today   Orthostatic hypotension Episodes of syncope, recommended increased hydration Delay taking isosorbide to later in the day   Total encounter time more than 30 minutes  Greater than 50% was spent in counseling and coordination of care with the patient    Signed, Esmond Plants, M.D., Ph.D. [place], [place]

## 2022-03-27 NOTE — Telephone Encounter (Signed)
Patient scheduled to F/U with Dr. Rockey Situ tomorrow. Will you please refill if appropriate? Thank you!

## 2022-03-27 NOTE — Telephone Encounter (Signed)
LVM to call back to office to schedule an appt to have her b12level to  be checked and then she can get a b12 injection

## 2022-03-27 NOTE — Telephone Encounter (Signed)
Patient would like a B-12 shot if the doctor approves. She wants to come into the office tomorrow.

## 2022-03-28 ENCOUNTER — Encounter: Payer: Self-pay | Admitting: Cardiovascular Disease

## 2022-03-28 ENCOUNTER — Ambulatory Visit (INDEPENDENT_AMBULATORY_CARE_PROVIDER_SITE_OTHER): Payer: Medicare Other

## 2022-03-28 ENCOUNTER — Ambulatory Visit: Payer: Medicare Other | Attending: Cardiovascular Disease | Admitting: Cardiovascular Disease

## 2022-03-28 VITALS — BP 110/56 | HR 73 | Ht 65.5 in | Wt 146.2 lb

## 2022-03-28 DIAGNOSIS — I5032 Chronic diastolic (congestive) heart failure: Secondary | ICD-10-CM | POA: Diagnosis not present

## 2022-03-28 DIAGNOSIS — R0609 Other forms of dyspnea: Secondary | ICD-10-CM | POA: Diagnosis not present

## 2022-03-28 DIAGNOSIS — R55 Syncope and collapse: Secondary | ICD-10-CM

## 2022-03-28 DIAGNOSIS — I10 Essential (primary) hypertension: Secondary | ICD-10-CM | POA: Diagnosis not present

## 2022-03-28 DIAGNOSIS — I700 Atherosclerosis of aorta: Secondary | ICD-10-CM

## 2022-03-28 DIAGNOSIS — Z23 Encounter for immunization: Secondary | ICD-10-CM | POA: Diagnosis not present

## 2022-03-28 DIAGNOSIS — E782 Mixed hyperlipidemia: Secondary | ICD-10-CM | POA: Diagnosis not present

## 2022-03-28 MED ORDER — ISOSORBIDE MONONITRATE ER 30 MG PO TB24: ORAL_TABLET | ORAL | 3 refills | Status: DC

## 2022-03-28 NOTE — Patient Instructions (Addendum)
Increase hydration in the mornings  Medication Instructions: - Your physician has recommended you make the following change in your medication:     1) Take the isosorbide in the evening (same dose)  If you need a refill on your cardiac medications before your next appointment, please call your pharmacy.    Lab work: No new labs needed   Testing/Procedures: 1) Heart Monitor:  Length of Wear: 14 days  Your monitor will be mailed to your home address within 3-5 business days. However, if you have not received your monitor after 5 business days please send [place] a MyChart message or call the office at [phone], so we may follow up on this for you.   Your physician has recommended that you wear a Zio AT (heart) monitor.   This monitor is a medical device that records the heart's electrical activity. Doctors most often use these monitors to diagnose arrhythmias. Arrhythmias are problems with the speed or rhythm of the heartbeat. The monitor is a small device applied to your chest. You can wear one while you do your normal daily activities. While wearing this monitor if you have any symptoms to push the button and record what you felt. Once you have worn this monitor for the period of time provider prescribed (Usually 14 days), you will return the monitor device in the postage paid box. Once it is returned they will download the data collected and provide [place] with a report which the provider will then review and we will call you with those results. Important tips:  Avoid showering during the first 24 hours of wearing the monitor. Avoid excessive sweating to help maximize wear time. Do not submerge the device, no hot tubs, and no swimming pools. Keep any lotions or oils away from the patch. After 24 hours you may shower with the patch on. Take brief showers with your back facing the shower head.  Do not remove patch once it has been placed because that will interrupt data and decrease  adhesive wear time. Push the button when you have any symptoms and write down what you were feeling. Once you have completed wearing your monitor, remove and place into box which has postage paid and place in your outgoing mailbox.  If for some reason you have misplaced your box then call our office and we can provide another box and/or mail it off for you.       Follow-Up: At [facility] you and your health needs are our priority.  As part of our continuing mission to provide you with exceptional heart care, we have created designated Provider Care Teams.  These Care Teams include your primary Cardiologist (physician) and Advanced Practice Providers (APPs -  Physician Assistants and Nurse Practitioners) who all work together to provide you with the care you need, when you need it.  You will need a follow up appointment in 6 months  Providers on your designated Care Team:   Murray Hodgkins, NP Christell Faith, PA-C Cadence Kathlen Mody, [place]  COVID-19 Vaccine Information can be found at: [email or web address] For questions related to vaccine distribution or appointments, please email [email or web address] or call [phone].

## 2022-03-28 NOTE — Progress Notes (Unsigned)
Patient arrived for High Dose Flu vaccine and also received Prevnar 20 vaccine. Patient tolerated vaccines well. Patient did not show any signs of distress or voice any concerns.

## 2022-03-31 ENCOUNTER — Other Ambulatory Visit: Payer: Self-pay | Admitting: Internal Medicine

## 2022-04-01 DIAGNOSIS — R55 Syncope and collapse: Secondary | ICD-10-CM | POA: Diagnosis not present

## 2022-04-02 ENCOUNTER — Encounter: Payer: Self-pay | Admitting: Internal Medicine

## 2022-04-02 DIAGNOSIS — R55 Syncope and collapse: Secondary | ICD-10-CM | POA: Diagnosis not present

## 2022-04-03 MED ORDER — HYOSCYAMINE SULFATE 0.125 MG SL SUBL: SUBLINGUAL_TABLET | SUBLINGUAL | 1 refills | Status: DC

## 2022-04-07 ENCOUNTER — Encounter (INDEPENDENT_AMBULATORY_CARE_PROVIDER_SITE_OTHER): Payer: Self-pay

## 2022-04-22 ENCOUNTER — Ambulatory Visit: Payer: Medicare Other | Admitting: Gastroenterology

## 2022-05-05 ENCOUNTER — Encounter: Payer: Self-pay | Admitting: Internal Medicine

## 2022-05-05 ENCOUNTER — Ambulatory Visit (INDEPENDENT_AMBULATORY_CARE_PROVIDER_SITE_OTHER): Payer: Medicare Other | Admitting: Internal Medicine

## 2022-05-05 VITALS — BP 144/70 | HR 83 | Temp 97.5°F | Ht 65.5 in | Wt 144.2 lb

## 2022-05-05 DIAGNOSIS — I700 Atherosclerosis of aorta: Secondary | ICD-10-CM | POA: Diagnosis not present

## 2022-05-05 DIAGNOSIS — E559 Vitamin D deficiency, unspecified: Secondary | ICD-10-CM

## 2022-05-05 DIAGNOSIS — D649 Anemia, unspecified: Secondary | ICD-10-CM | POA: Diagnosis not present

## 2022-05-05 DIAGNOSIS — E785 Hyperlipidemia, unspecified: Secondary | ICD-10-CM

## 2022-05-05 DIAGNOSIS — R600 Localized edema: Secondary | ICD-10-CM

## 2022-05-05 DIAGNOSIS — M6281 Muscle weakness (generalized): Secondary | ICD-10-CM

## 2022-05-05 DIAGNOSIS — L281 Prurigo nodularis: Secondary | ICD-10-CM

## 2022-05-05 DIAGNOSIS — M810 Age-related osteoporosis without current pathological fracture: Secondary | ICD-10-CM

## 2022-05-05 DIAGNOSIS — E1169 Type 2 diabetes mellitus with other specified complication: Secondary | ICD-10-CM | POA: Diagnosis not present

## 2022-05-05 DIAGNOSIS — N6459 Other signs and symptoms in breast: Secondary | ICD-10-CM | POA: Diagnosis not present

## 2022-05-05 DIAGNOSIS — Z1231 Encounter for screening mammogram for malignant neoplasm of breast: Secondary | ICD-10-CM | POA: Diagnosis not present

## 2022-05-05 DIAGNOSIS — E032 Hypothyroidism due to medicaments and other exogenous substances: Secondary | ICD-10-CM | POA: Diagnosis not present

## 2022-05-05 DIAGNOSIS — K225 Diverticulum of esophagus, acquired: Secondary | ICD-10-CM | POA: Diagnosis not present

## 2022-05-05 DIAGNOSIS — R87610 Atypical squamous cells of undetermined significance on cytologic smear of cervix (ASC-US): Secondary | ICD-10-CM

## 2022-05-05 DIAGNOSIS — E1142 Type 2 diabetes mellitus with diabetic polyneuropathy: Secondary | ICD-10-CM

## 2022-05-05 MED ORDER — HYDROXYZINE PAMOATE 25 MG PO CAPS: 25.0000 mg | ORAL_CAPSULE | Freq: Three times a day (TID) | ORAL | 1 refills | Status: DC | PRN

## 2022-05-05 MED ORDER — RSVPREF3 VAC RECOMB ADJUVANTED 120 MCG/0.5ML IM SUSR: 0.5000 mL | Freq: Once | INTRAMUSCULAR | 0 refills | Status: AC

## 2022-05-05 NOTE — Patient Instructions (Addendum)
Moisturize  your arms and legs a second time after 4 pm  with cetaphil or eucerin .  Mist your skin first to improve absorption   Hydroxyzine for itching .  Rx sent to pharmacy   I have made a Vascular referral to rule out lymphedema   Start taking lasix and potassium every other day and follow BP readings daily with a log   I have made a Referral to [facility] on [address] for Physical Therapy  Your annual mammogram AND  DEXA  SCAN have been ordered.  Please call [place] to call to make your appointments  .  The phone number for [place] [facility] is  [phone]     I do recommend the RSV vaccine for you, .  It is now available at your pharmacy  and will protect you against the Respiratory Syncytial Virus

## 2022-05-05 NOTE — Progress Notes (Unsigned)
Subjective:  Patient ID: Megan Bradley, female    DOB: 11/23/1940  Age: 81 y.o. MRN: [id number]  CC: There were no encounter diagnoses.   HPI [name] presents for follow up on multiple complaints  1) Pruritus secondary to diagnosis of prurigo nodularis,  managed with Dupixent by Dr [name] . However lately she has noticed an increase in pruritus by 7 pm occurring nightly. SKIN APPEARS DRY  in spite of using [employer] for eczema    2)  Balance is worsening.   Can push a grocery cart fine, but without a cart she  has no balance.  She has fallen twice, both from a standing position.  Wore a 2 week monitor by [facility] but did not fall during the two weeks.  He prescribed lasix and potassium for leg swelling and hypertension .    has been taking it prn . BP has been elevated without it. To 162/80 , but mostly 140  range . She has a History of hypotension  while taking lisinopril/hctz.   Eats canned soup twice daily .  3) GERD esophagitis (by EGD sept 28) by Dr Modena Nunnery:  omeprazole 40 mg bid and famotidine 20 mg bid advised. No sign of celiac or EE.   Zencker's diverticulum noted, so she was not dilated.   4)  Type 2 DM.  She  feels generally well,  But is not  exercising regularly or trying to lose weight. Checking  blood sugars less than once daily at variable times, usually only if she feels she may be having a hypoglycemic event. .  BS have been under 130 fasting and < 150 post prandially.  Denies any recent hypoglyemic events.  Taking   medications as directed. Following a carbohydrate modified diet 5 days per week. Denies numbness, burning and tingling of extremities. Appetite is good.  Had eye exam.   Outpatient Medications Prior to Visit  Medication Sig Dispense Refill   ALPRAZolam (XANAX) 0.5 MG tablet Take 1 tablet by mouth twice daily as needed for anxiety 60 tablet 5   amoxicillin (AMOXIL) 500 MG capsule SMARTSIG:4 Capsule(s) By Mouth Once (Patient not taking: Reported on 03/06/2022)      DUPIXENT 300 MG/2ML SOPN      ezetimibe (ZETIA) 10 MG tablet TAKE 1 TABLET BY MOUTH  DAILY 90 tablet 3   furosemide (LASIX) 20 MG tablet TAKE 1 TABLET BY MOUTH  DAILY AS NEEDED FOR  SWELLING/FLUID RETENTION 90 tablet 3   glucose blood (ACCU-CHEK GUIDE) test strip Use to check blood sugars once daily. Dx Code: E11.9 100 each 3   hyoscyamine (LEVSIN SL) 0.125 MG SL tablet DISSOLVE 1 TABLET IN MOUTH EVERY 4 HOURS AS NEEDED 90 tablet 1   isosorbide mononitrate (IMDUR) 30 MG 24 hr tablet Take 1 tablet (30 mg) by mouth once daily in the evening 90 tablet 3   levothyroxine (SYNTHROID) 112 MCG tablet TAKE 1 TABLET BY MOUTH  DAILY BEFORE BREAKFAST 90 tablet 3   meclizine (ANTIVERT) 25 MG tablet TAKE 1 TABLET BY MOUTH THREE TIMES DAILY AS NEEDED FOR VERTIGO 30 tablet 0   methylPREDNISolone (MEDROL DOSEPAK) 4 MG TBPK tablet Take by mouth as directed. (Patient not taking: Reported on 03/06/2022)     omeprazole (PRILOSEC) 40 MG capsule Take 1 capsule (40 mg total) by mouth 2 (two) times daily. 180 capsule 3   potassium chloride (KLOR-CON) 10 MEQ tablet Take 1 tablet (10 mEq total) by mouth daily as needed (with lasix).  30 tablet 3   rosuvastatin (CRESTOR) 20 MG tablet TAKE 1 TABLET BY MOUTH EVERY  OTHER DAY 45 tablet 3   sertraline (ZOLOFT) 100 MG tablet TAKE 2 TABLETS BY MOUTH DAILY 180 tablet 3   Facility-Administered Medications Prior to Visit  Medication Dose Route Frequency Provider Last Rate Last Admin   clindamycin (CLEOCIN) IVPB 600 mg  600 mg Intravenous Once Thornton Park, MD        Review of Systems;  Patient denies headache, fevers, malaise, unintentional weight loss, skin rash, eye pain, sinus congestion and sinus pain, sore throat, dysphagia,  hemoptysis , cough, dyspnea, wheezing, chest pain, palpitations, orthopnea, edema, abdominal pain, nausea, melena, diarrhea, constipation, flank pain, dysuria, hematuria, urinary  Frequency, nocturia, numbness, tingling, seizures,  Focal weakness, Loss  of consciousness,  Tremor, insomnia, depression, anxiety, and suicidal ideation.      Objective:  There were no vitals taken for this visit.  BP Readings from Last 3 Encounters:  03/28/22 (!) 110/56  03/06/22 133/67  02/03/22 120/64    Wt Readings from Last 3 Encounters:  03/28/22 146 lb 4 oz (66.3 kg)  03/06/22 139 lb (63 kg)  02/03/22 139 lb (63 kg)    General appearance: alert, cooperative and appears stated age Ears: normal TM's and external ear canals both ears Throat: lips, mucosa, and tongue normal; teeth and gums normal Neck: no adenopathy, no carotid bruit, supple, symmetrical, trachea midline and thyroid not enlarged, symmetric, no tenderness/mass/nodules Back: symmetric, no curvature. ROM normal. No CVA tenderness. Lungs: clear to auscultation bilaterally Heart: regular rate and rhythm, S1, S2 normal, no murmur, click, rub or gallop Abdomen: soft, non-tender; bowel sounds normal; no masses,  no organomegaly Pulses: 2+ and symmetric Skin: Skin color, texture, turgor normal. No rashes or lesions Lymph nodes: Cervical, supraclavicular, and axillary nodes normal. Neuro:  awake and interactive with normal mood and affect. Higher cortical functions are normal. Speech is clear without word-finding difficulty or dysarthria. Extraocular movements are intact. Visual fields of both eyes are grossly intact. Sensation to light touch is grossly intact bilaterally of upper and lower extremities. Motor examination shows 4+/5 symmetric hand grip and upper extremity and 5/5 lower extremity strength. There is no pronation or drift. Gait is non-ataxic   Lab Results  Component Value Date   HGBA1C 7.2 (H) 01/29/2022   HGBA1C 6.4 (A) 10/29/2021   HGBA1C 6.9 (H) 07/30/2021    Lab Results  Component Value Date   CREATININE 0.86 01/29/2022   CREATININE 0.96 07/30/2021   CREATININE 0.89 05/14/2021    Lab Results  Component Value Date   WBC 6.8 01/29/2022   HGB 11.3 (L) 01/29/2022    HCT 33.9 (L) 01/29/2022   PLT 141.0 (L) 01/29/2022   GLUCOSE 75 01/29/2022   CHOL 136 01/29/2022   TRIG 41.0 01/29/2022   HDL 73.70 01/29/2022   LDLDIRECT 93.0 01/02/2016   LDLCALC 55 01/29/2022   ALT 13 01/29/2022   AST 17 01/29/2022   NA 140 01/29/2022   K 3.8 01/29/2022   CL 104 01/29/2022   CREATININE 0.86 01/29/2022   BUN 20 01/29/2022   CO2 25 01/29/2022   TSH 1.54 01/29/2022   INR 1.0 09/26/2020   HGBA1C 7.2 (H) 01/29/2022   MICROALBUR 1.0 07/30/2021    DG ESOPHAGUS W DOUBLE CM (HD)  Result Date: 12/23/2021 CLINICAL DATA:  Dysphagia EXAM: ESOPHAGUS/BARIUM SWALLOW/TABLET STUDY TECHNIQUE: Combined double and single contrast examination was performed using effervescent crystals, high-density barium, and thin liquid barium. This exam  was performed by APP name, and was supervised and interpreted by Rad name. FLUOROSCOPY: Radiation Exposure Index (as provided by the fluoroscopic device): 15.5 mGy COMPARISON:  None Available. FINDINGS: Normal pharyngeal anatomy and motility. Contrast flowed freely through the esophagus without evidence of a stricture or mass. Normal esophageal mucosa without evidence of irregularity or ulceration. 6 mm Zenker's diverticulum. Mild gastroesophageal reflux. Tertiary contractions of the esophagus as can be seen with presbyesophagus versus mild spasm. No definite hiatal hernia was demonstrated. At the end of the examination a 13 mm barium tablet was administered which transited through the esophagus and esophagogastric junction without delay. IMPRESSION: 1.  6 mm Zenker's diverticulum. 2. Mild gastroesophageal reflux. 3. Tertiary contractions of the esophagus as can be seen with presbyesophagus versus mild spasm. Electronically Signed   By: Kathreen Devoid M.D.   On: 12/23/2021 09:34    Assessment & Plan:   Problem List Items Addressed This Visit   None   I spent a total of   minutes with this patient in a face to face visit on the date of this encounter  reviewing the last office visit with me in       ,  most recent visit with cardiology ,    ,  patient's diet and exercise habits, home blood pressure /blod sugar readings, recent ER visit including labs and imaging studies ,   and post visit ordering of testing and therapeutics.    Follow-up: No follow-ups on file.   Crecencio Mc, MD

## 2022-05-06 ENCOUNTER — Encounter: Payer: Self-pay | Admitting: Internal Medicine

## 2022-05-06 LAB — CBC WITH DIFFERENTIAL/PLATELET
Basophils Absolute: 0.1 10*3/uL (ref 0.0–0.1)
Basophils Relative: 1.6 % (ref 0.0–3.0)
Eosinophils Absolute: 0.2 10*3/uL (ref 0.0–0.7)
Eosinophils Relative: 2.3 % (ref 0.0–5.0)
HCT: 32.1 % — ABNORMAL LOW (ref 36.0–46.0)
Hemoglobin: 10.7 g/dL — ABNORMAL LOW (ref 12.0–15.0)
Lymphocytes Relative: 13.5 % (ref 12.0–46.0)
Lymphs Abs: 1 10*3/uL (ref 0.7–4.0)
MCHC: 33.3 g/dL (ref 30.0–36.0)
MCV: 92.8 fl (ref 78.0–100.0)
Monocytes Absolute: 0 10*3/uL — ABNORMAL LOW (ref 0.1–1.0)
Monocytes Relative: 0.3 % — ABNORMAL LOW (ref 3.0–12.0)
Neutro Abs: 6.1 10*3/uL (ref 1.4–7.7)
Neutrophils Relative %: 82.3 % — ABNORMAL HIGH (ref 43.0–77.0)
Platelets: 155 10*3/uL (ref 150.0–400.0)
RBC: 3.46 Mil/uL — ABNORMAL LOW (ref 3.87–5.11)
RDW: 19.9 % — ABNORMAL HIGH (ref 11.5–15.5)
WBC: 7.4 10*3/uL (ref 4.0–10.5)

## 2022-05-06 LAB — COMPREHENSIVE METABOLIC PANEL
ALT: 12 U/L (ref 0–35)
AST: 17 U/L (ref 0–37)
Albumin: 4 g/dL (ref 3.5–5.2)
Alkaline Phosphatase: 68 U/L (ref 39–117)
BUN: 19 mg/dL (ref 6–23)
CO2: 29 mEq/L (ref 19–32)
Calcium: 9.3 mg/dL (ref 8.4–10.5)
Chloride: 105 mEq/L (ref 96–112)
Creatinine, Ser: 0.97 mg/dL (ref 0.40–1.20)
GFR: 55.46 mL/min — ABNORMAL LOW (ref >60.00)
Glucose, Bld: 81 mg/dL (ref 70–99)
Potassium: 3.9 mEq/L (ref 3.5–5.1)
Sodium: 142 mEq/L (ref 135–145)
Total Bilirubin: 0.4 mg/dL (ref 0.2–1.2)
Total Protein: 7.5 g/dL (ref 6.0–8.3)

## 2022-05-06 LAB — VITAMIN D 25 HYDROXY (VIT D DEFICIENCY, FRACTURES): VITD: 27.47 ng/mL — ABNORMAL LOW (ref 30.00–100.00)

## 2022-05-06 LAB — HEMOGLOBIN A1C: Hgb A1c MFr Bld: 7 % — ABNORMAL HIGH (ref 4.6–6.5)

## 2022-05-06 NOTE — Assessment & Plan Note (Signed)
Improving skin texture and symptoms with use of Dupixent; however, her evening pruritus is likely aggravated by dry skin.  Encouraged to moisturize in the afternoon with Cetaphil or Eucerin.  Hydroxyine prescribed for prn use.

## 2022-05-06 NOTE — Assessment & Plan Note (Signed)
Managed with daily Zetia and QOD rosuvastatin per patient preference.  A1c has been repeated due to previous elevation   Lab Results  Component Value Date   CHOL 136 01/29/2022   HDL 73.70 01/29/2022   LDLCALC 55 01/29/2022   LDLDIRECT 93.0 01/02/2016   TRIG 41.0 01/29/2022   CHOLHDL 2 01/29/2022   Lab Results  Component Value Date   ALT 13 01/29/2022   AST 17 01/29/2022   ALKPHOS 71 01/29/2022   BILITOT 0.5 01/29/2022

## 2022-05-06 NOTE — Assessment & Plan Note (Signed)
Secondary to irregularities in the breast implants.  Reassurance provided today the nodules she has felt are not in her breast tissue but on the capsule. Diagnostic mammogram and ultrasound done less than 6 months ago reviewed.  Annual screening mammogram ordered.

## 2022-05-06 NOTE — Assessment & Plan Note (Signed)
Thus far she has maintained Reasonable control for age without medications.    Poor balance is in part due to diabetes but aggravated by deconditioning and proximal muscle weakness.  Vestibular rehab offered and accepted.   Lab Results  Component Value Date   HGBA1C 7.2 (H) 01/29/2022   Lab Results  Component Value Date   MICROALBUR 1.0 07/30/2021   MICROALBUR <0.7 01/16/2021

## 2022-05-06 NOTE — Assessment & Plan Note (Signed)
She is encouraged to Continue  dual  therapy with ezitimibe and twice weekly crestor given documented evidence of moderate  atherosclerosis in the aorta on prior imaging studies .  She is tolerating zetia and crestor.

## 2022-05-06 NOTE — Assessment & Plan Note (Signed)
Caused by use of diltiazem for nutcracker esophagus.  She has mild edema of the right leg, non pitting,  attributed to her history of TKR

## 2022-05-15 ENCOUNTER — Other Ambulatory Visit (INDEPENDENT_AMBULATORY_CARE_PROVIDER_SITE_OTHER): Payer: Self-pay | Admitting: Nurse Practitioner

## 2022-05-15 ENCOUNTER — Ambulatory Visit (INDEPENDENT_AMBULATORY_CARE_PROVIDER_SITE_OTHER): Payer: Medicare Other | Admitting: Internal Medicine

## 2022-05-15 ENCOUNTER — Encounter: Payer: Self-pay | Admitting: Internal Medicine

## 2022-05-15 VITALS — BP 132/82 | HR 76 | Temp 97.6°F | Wt 142.8 lb

## 2022-05-15 DIAGNOSIS — D649 Anemia, unspecified: Secondary | ICD-10-CM | POA: Diagnosis not present

## 2022-05-15 DIAGNOSIS — D472 Monoclonal gammopathy: Secondary | ICD-10-CM

## 2022-05-15 DIAGNOSIS — N182 Chronic kidney disease, stage 2 (mild): Secondary | ICD-10-CM | POA: Diagnosis not present

## 2022-05-15 DIAGNOSIS — Z862 Personal history of diseases of the blood and blood-forming organs and certain disorders involving the immune mechanism: Secondary | ICD-10-CM

## 2022-05-15 DIAGNOSIS — L281 Prurigo nodularis: Secondary | ICD-10-CM

## 2022-05-15 DIAGNOSIS — E559 Vitamin D deficiency, unspecified: Secondary | ICD-10-CM | POA: Diagnosis not present

## 2022-05-15 DIAGNOSIS — M7989 Other specified soft tissue disorders: Secondary | ICD-10-CM

## 2022-05-15 LAB — RENAL FUNCTION PANEL
Albumin: 3.9 g/dL (ref 3.5–5.2)
BUN: 19 mg/dL (ref 6–23)
CO2: 31 mEq/L (ref 19–32)
Calcium: 9.1 mg/dL (ref 8.4–10.5)
Chloride: 104 mEq/L (ref 96–112)
Creatinine, Ser: 0.94 mg/dL (ref 0.40–1.20)
GFR: 57.58 mL/min — ABNORMAL LOW (ref >60.00)
Glucose, Bld: 102 mg/dL — ABNORMAL HIGH (ref 70–99)
Phosphorus: 4.1 mg/dL (ref 2.3–4.6)
Potassium: 3.9 mEq/L (ref 3.5–5.1)
Sodium: 141 mEq/L (ref 135–145)

## 2022-05-15 MED ORDER — ERGOCALCIFEROL 1.25 MG (50000 UT) PO CAPS: 50000.0000 [IU] | ORAL_CAPSULE | ORAL | 0 refills | Status: DC

## 2022-05-15 NOTE — Assessment & Plan Note (Addendum)
Drop in hemoglobin noted.  Checking iron studies  Lab Results  Component Value Date   WBC 7.4 05/05/2022   HGB 10.7 (L) 05/05/2022   HCT 32.1 (L) 05/05/2022   MCV 92.8 05/05/2022   PLT 155.0 05/05/2022     Lab Results  Component Value Date   VITAMINB12 475 01/29/2021   Lab Results  Component Value Date   IRON 53 12/13/2020   TIBC 303 12/13/2020   FERRITIN 344 (H) 12/13/2020   Worsening by repeat labs.

## 2022-05-15 NOTE — Patient Instructions (Addendum)
Go get your RSV and Td  vaccines,  but separate them by 2 weeks  We are repeating the labs that Dr Janese Banks does  annually   Increase moisturizing to 3 times daily   Take the megadose of vitamin D ONCE A WEEK for 3 months.    Return in 3 months for repeat labs

## 2022-05-15 NOTE — Progress Notes (Signed)
Subjective:  Patient ID: Megan Bradley, female    DOB: [birth year]  Age: 81 y.o. MRN: [id number]  CC: The primary encounter diagnosis was Anemia, unspecified type. Diagnoses of MGUS (monoclonal gammopathy of unknown significance), Prurigo nodularis, Hypovitaminosis D, History of pernicious anemia, and Stage 2 chronic kidney disease were also pertinent to this visit.   HPI Megan Bradley presents for follow up on abnormal labs Chief Complaint  Patient presents with   discuss medication   1) Low Vitamin D:  she has not been taking any supplements   2) worsening anemia:  she has  MGUS ; last labs were over a year ago.   Also has pernicious anemia and has not been taking b12 injections.    '3) increased GFR :  diet and use of OTC meds reviewed.  She does not take NSAIDs and does not drink enough water      Outpatient Medications Prior to Visit  Medication Sig Dispense Refill   ALPRAZolam (XANAX) 0.5 MG tablet Take 1 tablet by mouth twice daily as needed for anxiety 60 tablet 5   DUPIXENT 300 MG/2ML SOPN      ezetimibe (ZETIA) 10 MG tablet TAKE 1 TABLET BY MOUTH  DAILY 90 tablet 3   furosemide (LASIX) 20 MG tablet TAKE 1 TABLET BY MOUTH  DAILY AS NEEDED FOR  SWELLING/FLUID RETENTION 90 tablet 3   glucose blood (ACCU-CHEK GUIDE) test strip Use to check blood sugars once daily. Dx Code: E11.9 100 each 3   hydrOXYzine (VISTARIL) 25 MG capsule Take 1 capsule (25 mg total) by mouth every 8 (eight) hours as needed. 90 capsule 1   hyoscyamine (LEVSIN SL) 0.125 MG SL tablet DISSOLVE 1 TABLET IN MOUTH EVERY 4 HOURS AS NEEDED 90 tablet 1   isosorbide mononitrate (IMDUR) 30 MG 24 hr tablet Take 1 tablet (30 mg) by mouth once daily in the evening 90 tablet 3   levothyroxine (SYNTHROID) 112 MCG tablet TAKE 1 TABLET BY MOUTH  DAILY BEFORE BREAKFAST 90 tablet 3   meclizine (ANTIVERT) 25 MG tablet TAKE 1 TABLET BY MOUTH THREE TIMES DAILY AS NEEDED FOR VERTIGO 30 tablet 0   omeprazole (PRILOSEC) 40 MG  capsule Take 1 capsule (40 mg total) by mouth 2 (two) times daily. 180 capsule 3   potassium chloride (KLOR-CON) 10 MEQ tablet Take 1 tablet (10 mEq total) by mouth daily as needed (with lasix). 30 tablet 3   rosuvastatin (CRESTOR) 20 MG tablet TAKE 1 TABLET BY MOUTH EVERY  OTHER DAY 45 tablet 3   sertraline (ZOLOFT) 100 MG tablet TAKE 2 TABLETS BY MOUTH DAILY 180 tablet 3   Facility-Administered Medications Prior to Visit  Medication Dose Route Frequency Provider Last Rate Last Admin   clindamycin (CLEOCIN) IVPB 600 mg  600 mg Intravenous Once [place], MD        Review of Systems;  Patient denies headache, fevers, malaise, unintentional weight loss, skin rash, eye pain, sinus congestion and sinus pain, sore throat, dysphagia,  hemoptysis , cough, dyspnea, wheezing, chest pain, palpitations, orthopnea, edema, abdominal pain, nausea, melena, diarrhea, constipation, flank pain, dysuria, hematuria, urinary  Frequency, nocturia, numbness, tingling, seizures,  Focal weakness, Loss of consciousness,  Tremor, insomnia, depression, anxiety, and suicidal ideation.      Objective:  BP 132/82   Pulse 76   Temp 97.6 F (36.4 C) (Oral)   Wt 142 lb 12.8 oz (64.8 kg)   SpO2 96%   BMI 23.40 kg/m  BP Readings from Last 3 Encounters:  05/15/22 132/82  05/05/22 (!) 144/70  03/28/22 (!) 110/56    Wt Readings from Last 3 Encounters:  05/15/22 142 lb 12.8 oz (64.8 kg)  05/05/22 144 lb 3.2 oz (65.4 kg)  03/28/22 146 lb 4 oz (66.3 kg)    General appearance: alert, cooperative and appears stated age Ears: normal TM's and external ear canals both ears Throat: lips, mucosa, and tongue normal; teeth and gums normal Neck: no adenopathy, no carotid bruit, supple, symmetrical, trachea midline and thyroid not enlarged, symmetric, no tenderness/mass/nodules Back: symmetric, no curvature. ROM normal. No CVA tenderness. Lungs: clear to auscultation bilaterally Heart: regular rate and rhythm, S1,  S2 normal, no murmur, click, rub or gallop Abdomen: soft, non-tender; bowel sounds normal; no masses,  no organomegaly Pulses: 2+ and symmetric Skin: Skin color, texture, turgor normal. No rashes or lesions Lymph nodes: Cervical, supraclavicular, and axillary nodes normal. Neuro:  awake and interactive with normal mood and affect. Higher cortical functions are normal. Speech is clear without word-finding difficulty or dysarthria. Extraocular movements are intact. Visual fields of both eyes are grossly intact. Sensation to light touch is grossly intact bilaterally of upper and lower extremities. Motor examination shows 4+/5 symmetric hand grip and upper extremity and 5/5 lower extremity strength. There is no pronation or drift. Gait is non-ataxic   Lab Results  Component Value Date   HGBA1C 7.0 (H) 05/05/2022   HGBA1C 7.2 (H) 01/29/2022   HGBA1C 6.4 (A) 10/29/2021    Lab Results  Component Value Date   CREATININE 0.94 05/15/2022   CREATININE 0.97 05/05/2022   CREATININE 0.86 01/29/2022    Lab Results  Component Value Date   WBC 7.4 05/05/2022   HGB 10.7 (L) 05/05/2022   HCT 32.1 (L) 05/05/2022   PLT 155.0 05/05/2022   GLUCOSE 102 (H) 05/15/2022   CHOL 136 01/29/2022   TRIG 41.0 01/29/2022   HDL 73.70 01/29/2022   LDLDIRECT 93.0 01/02/2016   LDLCALC 55 01/29/2022   ALT 12 05/05/2022   AST 17 05/05/2022   NA 141 05/15/2022   K 3.9 05/15/2022   CL 104 05/15/2022   CREATININE 0.94 05/15/2022   BUN 19 05/15/2022   CO2 31 05/15/2022   TSH 1.54 01/29/2022   INR 1.0 09/26/2020   HGBA1C 7.0 (H) 05/05/2022   MICROALBUR 1.0 07/30/2021    DG ESOPHAGUS W DOUBLE CM (HD)  Result Date: 12/23/2021 CLINICAL DATA:  Dysphagia EXAM: ESOPHAGUS/BARIUM SWALLOW/TABLET STUDY TECHNIQUE: Combined double and single contrast examination was performed using effervescent crystals, high-density barium, and thin liquid barium. This exam was performed by APP name, and was supervised and interpreted by  Rad name. FLUOROSCOPY: Radiation Exposure Index (as provided by the fluoroscopic device): 15.5 mGy COMPARISON:  None Available. FINDINGS: Normal pharyngeal anatomy and motility. Contrast flowed freely through the esophagus without evidence of a stricture or mass. Normal esophageal mucosa without evidence of irregularity or ulceration. 6 mm Zenker's diverticulum. Mild gastroesophageal reflux. Tertiary contractions of the esophagus as can be seen with presbyesophagus versus mild spasm. No definite hiatal hernia was demonstrated. At the end of the examination a 13 mm barium tablet was administered which transited through the esophagus and esophagogastric junction without delay. IMPRESSION: 1.  6 mm Zenker's diverticulum. 2. Mild gastroesophageal reflux. 3. Tertiary contractions of the esophagus as can be seen with presbyesophagus versus mild spasm. Electronically Signed   By: Kathreen Devoid M.D.   On: 12/23/2021 09:34    Assessment & Plan:  Problem List Items Addressed This Visit     Prurigo nodularis    Advised to moisturize her skin 3 times daily to  managed the xerosis caused by her medication       MGUS (monoclonal gammopathy of unknown significance)    Repeating her labs , as her anemia has worsened and her GFR has dropped       Relevant Orders   Multiple Myeloma Panel (SPEP&IFE w/QIG)   PTH, Intact and Calcium (Completed)   Renal function panel (Completed)   Hypovitaminosis D    Resume 1000 Ius of D3 daily   Last vitamin D Lab Results  Component Value Date   VD25OH 27.47 (L) 05/05/2022         History of pernicious anemia    Her intrinisic factor antibody was positive, and she has not been been taking injections and may be the source of her worsening anemia.  Will resume B12 injections ( B12 level has been ordered repeatedly but not done by lab)   Lab Results  Component Value Date   VITAMINB12 475 01/29/2021        CKD (chronic kidney disease)    Improved GFR by Repeat   assessment   Lab Results  Component Value Date   CREATININE 0.94 05/15/2022        Anemia - Primary    Drop in hemoglobin noted.  Checking iron studies  Lab Results  Component Value Date   WBC 7.4 05/05/2022   HGB 10.7 (L) 05/05/2022   HCT 32.1 (L) 05/05/2022   MCV 92.8 05/05/2022   PLT 155.0 05/05/2022    Lab Results  Component Value Date   VITAMINB12 475 01/29/2021   Lab Results  Component Value Date   IRON 53 12/13/2020   TIBC 303 12/13/2020   FERRITIN 344 (H) 12/13/2020  Worsening by repeat labs.        I spent a total of   minutes with this patient in a face to face visit on the date of this encounter reviewing the last office visit with me in       ,  most recent visit with cardiology ,    ,  patient's diet and exercise habits, home blood pressure /blod sugar readings, recent ER visit including labs and imaging studies ,   and post visit ordering of testing and therapeutics.    Follow-up: No follow-ups on file.   Crecencio Mc, MD

## 2022-05-16 ENCOUNTER — Other Ambulatory Visit: Payer: Self-pay | Admitting: Internal Medicine

## 2022-05-16 ENCOUNTER — Ambulatory Visit (INDEPENDENT_AMBULATORY_CARE_PROVIDER_SITE_OTHER): Payer: Medicare Other

## 2022-05-16 DIAGNOSIS — D472 Monoclonal gammopathy: Secondary | ICD-10-CM | POA: Diagnosis not present

## 2022-05-16 DIAGNOSIS — M7989 Other specified soft tissue disorders: Secondary | ICD-10-CM | POA: Diagnosis not present

## 2022-05-16 LAB — PTH, INTACT AND CALCIUM
Calcium: 9.3 mg/dL (ref 8.6–10.4)
PTH: 84 pg/mL — ABNORMAL HIGH (ref 16–77)

## 2022-05-17 DIAGNOSIS — N189 Chronic kidney disease, unspecified: Secondary | ICD-10-CM | POA: Insufficient documentation

## 2022-05-17 DIAGNOSIS — E1122 Type 2 diabetes mellitus with diabetic chronic kidney disease: Secondary | ICD-10-CM | POA: Insufficient documentation

## 2022-05-17 NOTE — Assessment & Plan Note (Signed)
Resume 1000 Ius of D3 daily   Last vitamin D Lab Results  Component Value Date   VD25OH 27.47 (L) 05/05/2022

## 2022-05-17 NOTE — Assessment & Plan Note (Signed)
Improved GFR by Repeat  assessment   Lab Results  Component Value Date   CREATININE 0.94 05/15/2022

## 2022-05-17 NOTE — Assessment & Plan Note (Signed)
Repeating her labs , as her anemia has worsened and her GFR has dropped

## 2022-05-17 NOTE — Assessment & Plan Note (Signed)
Advised to moisturize her skin 3 times daily to  managed the xerosis caused by her medication

## 2022-05-17 NOTE — Assessment & Plan Note (Addendum)
Her intrinisic factor antibody was positive, and she has not been been taking injections and may be the source of her worsening anemia.  Will resume B12 injections ( B12 level has been ordered repeatedly but not done by lab)   Lab Results  Component Value Date   UNHRVACQ58 475 01/29/2021

## 2022-05-19 ENCOUNTER — Encounter: Payer: Self-pay | Admitting: Internal Medicine

## 2022-05-19 DIAGNOSIS — D2261 Melanocytic nevi of right upper limb, including shoulder: Secondary | ICD-10-CM | POA: Diagnosis not present

## 2022-05-19 DIAGNOSIS — D485 Neoplasm of uncertain behavior of skin: Secondary | ICD-10-CM | POA: Diagnosis not present

## 2022-05-19 DIAGNOSIS — L2089 Other atopic dermatitis: Secondary | ICD-10-CM | POA: Diagnosis not present

## 2022-05-19 DIAGNOSIS — D2262 Melanocytic nevi of left upper limb, including shoulder: Secondary | ICD-10-CM | POA: Diagnosis not present

## 2022-05-19 DIAGNOSIS — L570 Actinic keratosis: Secondary | ICD-10-CM | POA: Diagnosis not present

## 2022-05-19 DIAGNOSIS — C44729 Squamous cell carcinoma of skin of left lower limb, including hip: Secondary | ICD-10-CM | POA: Diagnosis not present

## 2022-05-19 DIAGNOSIS — Z85828 Personal history of other malignant neoplasm of skin: Secondary | ICD-10-CM | POA: Diagnosis not present

## 2022-05-19 DIAGNOSIS — L568 Other specified acute skin changes due to ultraviolet radiation: Secondary | ICD-10-CM | POA: Diagnosis not present

## 2022-05-19 DIAGNOSIS — L281 Prurigo nodularis: Secondary | ICD-10-CM | POA: Diagnosis not present

## 2022-05-19 DIAGNOSIS — C44722 Squamous cell carcinoma of skin of right lower limb, including hip: Secondary | ICD-10-CM | POA: Diagnosis not present

## 2022-05-20 ENCOUNTER — Ambulatory Visit: Payer: Medicare Other

## 2022-05-20 ENCOUNTER — Ambulatory Visit (INDEPENDENT_AMBULATORY_CARE_PROVIDER_SITE_OTHER): Payer: Medicare Other

## 2022-05-20 DIAGNOSIS — E538 Deficiency of other specified B group vitamins: Secondary | ICD-10-CM

## 2022-05-20 DIAGNOSIS — R944 Abnormal results of kidney function studies: Secondary | ICD-10-CM

## 2022-05-20 DIAGNOSIS — Z862 Personal history of diseases of the blood and blood-forming organs and certain disorders involving the immune mechanism: Secondary | ICD-10-CM | POA: Diagnosis not present

## 2022-05-20 LAB — MULTIPLE MYELOMA PANEL, SERUM
Albumin SerPl Elph-Mcnc: 3.7 g/dL (ref 2.9–4.4)
Albumin/Glob SerPl: 1.1 (ref 0.7–1.7)
Alpha 1: 0.3 g/dL (ref 0.0–0.4)
Alpha2 Glob SerPl Elph-Mcnc: 0.7 g/dL (ref 0.4–1.0)
B-Globulin SerPl Elph-Mcnc: 1.9 g/dL — ABNORMAL HIGH (ref 0.7–1.3)
Gamma Glob SerPl Elph-Mcnc: 0.5 g/dL (ref 0.4–1.8)
Globulin, Total: 3.4 g/dL (ref 2.2–3.9)
IgA/Immunoglobulin A, Serum: 212 mg/dL (ref 64–422)
IgG (Immunoglobin G), Serum: 1585 mg/dL (ref 586–1602)
IgM (Immunoglobulin M), Srm: 57 mg/dL (ref 26–217)
M Protein SerPl Elph-Mcnc: 1.1 g/dL — ABNORMAL HIGH
Total Protein: 7.1 g/dL (ref 6.0–8.5)

## 2022-05-20 LAB — BASIC METABOLIC PANEL
BUN: 16 mg/dL (ref 6–23)
CO2: 29 mEq/L (ref 19–32)
Calcium: 8.8 mg/dL (ref 8.4–10.5)
Chloride: 104 mEq/L (ref 96–112)
Creatinine, Ser: 0.91 mg/dL (ref 0.40–1.20)
GFR: 59.86 mL/min — ABNORMAL LOW (ref >60.00)
Glucose, Bld: 107 mg/dL — ABNORMAL HIGH (ref 70–99)
Potassium: 3.9 mEq/L (ref 3.5–5.1)
Sodium: 141 mEq/L (ref 135–145)

## 2022-05-20 LAB — IFE AND PE, RANDOM URINE
% BETA, Urine: 29.2 %
ALBUMIN, U: 25.7 %
ALPHA 1 URINE: 2.7 %
ALPHA-2-GLOBULIN, U: 17.8 %
GAMMA GLOBULIN URINE: 24.6 %
M-SPIKE, %: 5.4 % — ABNORMAL HIGH
Protein, Ur: 15.7 mg/dL

## 2022-05-20 LAB — VITAMIN B12: Vitamin B-12: 384 pg/mL (ref 211–911)

## 2022-05-20 MED ORDER — CYANOCOBALAMIN 1000 MCG/ML IJ SOLN
1000.0000 ug | Freq: Once | INTRAMUSCULAR | Status: AC
  Administered 2022-05-20: 1000 ug via INTRAMUSCULAR

## 2022-05-20 NOTE — Telephone Encounter (Signed)
I spoke with pt in regards to the lab results about the b12. Pt is coming in today to have her b12 level drawn and then get her first b12 injection. However pt was also wanting to know what the results of the rest of her labs mean?

## 2022-05-20 NOTE — Progress Notes (Signed)
Pt presented for their vitamin B12 injection. Pt was identified through two identifiers. Pt tolerated shot well in their left  deltoid.  

## 2022-05-21 ENCOUNTER — Encounter: Payer: Self-pay | Admitting: Internal Medicine

## 2022-05-21 NOTE — Telephone Encounter (Signed)
noted 

## 2022-05-22 LAB — INTRINSIC FACTOR ANTIBODIES: Intrinsic Factor: UNDETERMINED — AB

## 2022-05-28 DIAGNOSIS — M5416 Radiculopathy, lumbar region: Secondary | ICD-10-CM | POA: Diagnosis not present

## 2022-06-03 DIAGNOSIS — M5416 Radiculopathy, lumbar region: Secondary | ICD-10-CM | POA: Diagnosis not present

## 2022-06-05 ENCOUNTER — Encounter: Payer: Self-pay | Admitting: Internal Medicine

## 2022-06-11 ENCOUNTER — Ambulatory Visit: Payer: Medicare Other | Admitting: Physical Therapy

## 2022-06-13 DIAGNOSIS — M5416 Radiculopathy, lumbar region: Secondary | ICD-10-CM | POA: Diagnosis not present

## 2022-06-16 ENCOUNTER — Ambulatory Visit: Payer: Medicare Other | Admitting: Physical Therapy

## 2022-06-18 ENCOUNTER — Ambulatory Visit: Payer: Medicare Other | Admitting: Physical Therapy

## 2022-06-18 DIAGNOSIS — M5416 Radiculopathy, lumbar region: Secondary | ICD-10-CM | POA: Diagnosis not present

## 2022-06-23 ENCOUNTER — Ambulatory Visit: Payer: Medicare Other | Admitting: Physical Therapy

## 2022-06-23 ENCOUNTER — Ambulatory Visit (INDEPENDENT_AMBULATORY_CARE_PROVIDER_SITE_OTHER): Payer: Medicare Other

## 2022-06-23 DIAGNOSIS — E538 Deficiency of other specified B group vitamins: Secondary | ICD-10-CM

## 2022-06-23 MED ORDER — CYANOCOBALAMIN 1000 MCG/ML IJ SOLN
1000.0000 ug | Freq: Once | INTRAMUSCULAR | Status: AC
  Administered 2022-06-23: 1000 ug via INTRAMUSCULAR

## 2022-06-23 NOTE — Progress Notes (Signed)
Pt presented for their vitamin B12 injection. Pt was identified through two identifiers. Pt tolerated shot well in their left  deltoid.  

## 2022-06-25 ENCOUNTER — Encounter: Payer: Medicare Other | Admitting: Physical Therapy

## 2022-06-25 ENCOUNTER — Emergency Department: Payer: Medicare Other

## 2022-06-25 ENCOUNTER — Encounter: Payer: Self-pay | Admitting: Emergency Medicine

## 2022-06-25 ENCOUNTER — Other Ambulatory Visit: Payer: Self-pay

## 2022-06-25 ENCOUNTER — Emergency Department
Admission: EM | Admit: 2022-06-25 | Discharge: 2022-06-25 | Disposition: A | Discharge status: Home / Self Care | Payer: Medicare Other | Attending: Emergency Medicine | Admitting: Emergency Medicine

## 2022-06-25 DIAGNOSIS — Y92002 Bathroom of unspecified non-institutional (private) residence single-family (private) house as the place of occurrence of the external cause: Secondary | ICD-10-CM | POA: Diagnosis not present

## 2022-06-25 DIAGNOSIS — R404 Transient alteration of awareness: Secondary | ICD-10-CM | POA: Diagnosis not present

## 2022-06-25 DIAGNOSIS — R55 Syncope and collapse: Secondary | ICD-10-CM | POA: Diagnosis not present

## 2022-06-25 DIAGNOSIS — R7989 Other specified abnormal findings of blood chemistry: Secondary | ICD-10-CM | POA: Diagnosis not present

## 2022-06-25 DIAGNOSIS — E039 Hypothyroidism, unspecified: Secondary | ICD-10-CM | POA: Insufficient documentation

## 2022-06-25 DIAGNOSIS — M4312 Spondylolisthesis, cervical region: Secondary | ICD-10-CM | POA: Diagnosis not present

## 2022-06-25 DIAGNOSIS — G4489 Other headache syndrome: Secondary | ICD-10-CM | POA: Diagnosis not present

## 2022-06-25 DIAGNOSIS — I499 Cardiac arrhythmia, unspecified: Secondary | ICD-10-CM | POA: Diagnosis not present

## 2022-06-25 DIAGNOSIS — W19XXXA Unspecified fall, initial encounter: Secondary | ICD-10-CM | POA: Diagnosis not present

## 2022-06-25 DIAGNOSIS — W0110XA Fall on same level from slipping, tripping and stumbling with subsequent striking against unspecified object, initial encounter: Secondary | ICD-10-CM | POA: Insufficient documentation

## 2022-06-25 DIAGNOSIS — S0990XA Unspecified injury of head, initial encounter: Secondary | ICD-10-CM

## 2022-06-25 DIAGNOSIS — Z743 Need for continuous supervision: Secondary | ICD-10-CM | POA: Diagnosis not present

## 2022-06-25 LAB — CBC WITH DIFFERENTIAL/PLATELET
Abs Immature Granulocytes: 0.04 10*3/uL (ref 0.00–0.07)
Basophils Absolute: 0 10*3/uL (ref 0.0–0.1)
Basophils Relative: 1 %
Eosinophils Absolute: 0.1 10*3/uL (ref 0.0–0.5)
Eosinophils Relative: 2 %
HCT: 34.7 % — ABNORMAL LOW (ref 36.0–46.0)
Hemoglobin: 11.1 g/dL — ABNORMAL LOW (ref 12.0–15.0)
Immature Granulocytes: 1 %
Lymphocytes Relative: 21 %
Lymphs Abs: 1.7 10*3/uL (ref 0.7–4.0)
MCH: 29.8 pg (ref 26.0–34.0)
MCHC: 32 g/dL (ref 30.0–36.0)
MCV: 93 fL (ref 80.0–100.0)
Monocytes Absolute: 0.5 10*3/uL (ref 0.1–1.0)
Monocytes Relative: 6 %
Neutro Abs: 5.6 10*3/uL (ref 1.7–7.7)
Neutrophils Relative %: 69 %
Platelets: 173 10*3/uL (ref 150–400)
RBC: 3.73 MIL/uL — ABNORMAL LOW (ref 3.87–5.11)
RDW: 20.3 % — ABNORMAL HIGH (ref 11.5–15.5)
WBC: 8 10*3/uL (ref 4.0–10.5)
nRBC: 0 % (ref 0.0–0.2)

## 2022-06-25 LAB — COMPREHENSIVE METABOLIC PANEL
ALT: 21 U/L (ref 0–44)
AST: 26 U/L (ref 15–41)
Albumin: 3.4 g/dL — ABNORMAL LOW (ref 3.5–5.0)
Alkaline Phosphatase: 49 U/L (ref 38–126)
Anion gap: 9 (ref 5–15)
BUN: 25 mg/dL — ABNORMAL HIGH (ref 8–23)
CO2: 23 mmol/L (ref 22–32)
Calcium: 8.8 mg/dL — ABNORMAL LOW (ref 8.9–10.3)
Chloride: 106 mmol/L (ref 98–111)
Creatinine, Ser: 1.01 mg/dL — ABNORMAL HIGH (ref 0.44–1.00)
GFR, Estimated: 57 mL/min — ABNORMAL LOW (ref >60)
Glucose, Bld: 117 mg/dL — ABNORMAL HIGH (ref 70–99)
Potassium: 4.4 mmol/L (ref 3.5–5.1)
Sodium: 138 mmol/L (ref 135–145)
Total Bilirubin: 1.4 mg/dL — ABNORMAL HIGH (ref 0.3–1.2)
Total Protein: 6.9 g/dL (ref 6.5–8.1)

## 2022-06-25 LAB — TSH: TSH: 25.369 u[IU]/mL — ABNORMAL HIGH (ref 0.350–4.500)

## 2022-06-25 LAB — TROPONIN I (HIGH SENSITIVITY)
Troponin I (High Sensitivity): 3 ng/L (ref <18)
Troponin I (High Sensitivity): 4 ng/L (ref <18)

## 2022-06-25 LAB — T4, FREE: Free T4: 0.74 ng/dL (ref 0.61–1.12)

## 2022-06-25 LAB — PHOSPHORUS: Phosphorus: 3.6 mg/dL (ref 2.5–4.6)

## 2022-06-25 LAB — MAGNESIUM: Magnesium: 2.2 mg/dL (ref 1.7–2.4)

## 2022-06-25 MED ORDER — SODIUM CHLORIDE 0.9 % IV BOLUS
1000.0000 mL | Freq: Once | INTRAVENOUS | Status: AC
  Administered 2022-06-25: 1000 mL via INTRAVENOUS

## 2022-06-25 MED ORDER — ONDANSETRON HCL 4 MG/2ML IJ SOLN
4.0000 mg | Freq: Once | INTRAMUSCULAR | Status: AC
  Administered 2022-06-25: 4 mg via INTRAVENOUS
  Filled 2022-06-25: qty 2

## 2022-06-25 MED ORDER — ACETAMINOPHEN 500 MG PO TABS
1000.0000 mg | ORAL_TABLET | Freq: Once | ORAL | Status: AC
  Administered 2022-06-25: 1000 mg via ORAL
  Filled 2022-06-25: qty 2

## 2022-06-25 NOTE — ED Notes (Signed)
Pin pad not working in room. Patient verbalized understanding of discharge with no questions for RN.

## 2022-06-25 NOTE — Discharge Instructions (Signed)
Take Tylenol 650 mg every 6 hours as needed for headache.  Drink plenty of fluids to stay well-hydrated.  Thank you for choosing [place] for your health care today!  Please see your primary doctor this week for a follow up appointment.   Sometimes, in the early stages of certain disease courses it is difficult to detect in the emergency department evaluation -- so, it is important that you continue to monitor your symptoms and call your doctor right away or return to the emergency department if you develop any new or worsening symptoms.  Please go to the following website to schedule new (and existing) patient appointments:   [email or web address]  If you do not have a primary doctor try calling the following clinics to establish care:  If you have insurance:  [facility] [phone] [place] [address]   [facility]  [phone] [address] [place] [address]   If you do not have insurance:  [facility]  [phone] [address] [place] [address]   The following is another list of primary care offices in the area who are accepting new patients at this time.  Please reach out to one of them directly and let them know you would like to schedule an appointment to follow up on an Emergency Department visit, and/or to establish a new primary care provider (PCP).  There are likely other primary care clinics in the are who are accepting new patients, but this is an excellent place to start:  [place] physician: Dr Lavon Paganini [address] [place], [place] [address] [phone]  [facility] Lead Physician: Dr Steele Sizer [address], [place], [place] [address] [phone]  [place] Physician: Dr Park Liter [address] [place], [place] [address] [phone]  [facility] Lead  Physician: Dr Dewaine Oats [place], [place], [place] [address] [phone]  [facility] Physician: Dr Halina Maidens [address] [place], [place] [address] [phone]   It was my pleasure to care for you today.   Hoover Brunette Jacelyn Grip, MD

## 2022-06-25 NOTE — ED Provider Notes (Signed)
[facility] Provider Note    Event Date/Time   First MD Initiated Contact with Patient 06/25/22 [phone]     (approximate)   History   Loss of Consciousness   HPI  Megan Bradley is a 82 y.o. female   Past medical history of esophageal spasms, anxiety, GERD, hypothyroid, who presents to the emergency department with syncope and head strike this morning.  She had a regular day yesterday, states that she drank 2 cups of coffee in the morning and then did not have much fluid intake throughout the rest of the day, but denies recent illnesses.  This morning she awoke around [place] to use the bathroom, walked to her in suite bathroom urinated and when she got up to go back to bed she felt lightheaded, flushed, and then had a syncopal event witnessed by her husband.  Brief loss of consciousness no more than a few seconds to a minute and then regained consciousness.  No reported seizure activity.  She denies any chest pain palpitation shortness of breath during this episode.  Denies bleeding.  Since that time she is felt back at her baseline, has been ambulating without lightheadedness or dizziness and only complaint at this time is mild nausea and headache.  She does not take blood thinners.  She did not report injuries elsewhere on the body.  Independent Historian contributed to assessment above: EMS.      Physical Exam   Triage Vital Signs: ED Triage Vitals  Enc Vitals Group     BP --      Pulse --      Resp --      Temp --      Temp src --      SpO2 06/25/22 0809 98 %     Weight 06/25/22 0810 139 lb (63 kg)     Height 06/25/22 0810 '5\' 5"'$  (1.651 m)     Head Circumference --      Peak Flow --      Pain Score 06/25/22 0810 7     Pain Loc --      Pain Edu? --      Excl. in [place]? --     Most recent vital signs: Vitals:   06/25/22 1024 06/25/22 1030  BP: 99/78 (!) 110/59  Pulse: 63 (!) 57  Resp: 13 14  Temp:    SpO2: 95% 98%    General: Awake, no  distress.  CV:  Good peripheral perfusion.  Resp:  Normal effort.  Abd:  No distention.  Other:  No obvious trauma or hematoma to the occiput of the head, no C-spine tenderness deformity or step-off.  Alert oriented pleasant cooperative moving all extremities with full active range of motion.  Motor or sensory exam intact.  No facial asymmetry no dysarthria.  She appears mildly dehydrated with dry mucous membranes and poor skin turgor.  ED Results / Procedures / Treatments   Labs (all labs ordered are listed, but only abnormal results are displayed) Labs Reviewed  COMPREHENSIVE METABOLIC PANEL - Abnormal; Notable for the following components:      Result Value   Glucose, Bld 117 (*)    BUN 25 (*)    Creatinine, Ser 1.01 (*)    Calcium 8.8 (*)    Albumin 3.4 (*)    Total Bilirubin 1.4 (*)    GFR, Estimated 57 (*)    All other components within normal limits  CBC WITH DIFFERENTIAL/PLATELET - Abnormal; Notable for  the following components:   RBC 3.73 (*)    Hemoglobin 11.1 (*)    HCT 34.7 (*)    RDW 20.3 (*)    All other components within normal limits  TSH - Abnormal; Notable for the following components:   TSH 25.369 (*)    All other components within normal limits  T4, FREE  MAGNESIUM  PHOSPHORUS  TROPONIN I (HIGH SENSITIVITY)  TROPONIN I (HIGH SENSITIVITY)     I ordered and reviewed the above labs they are notable for normal white blood cell count and H&H is at baseline.  Creatinine is ever so slightly elevated at 1 from a prior of 0.9.  EKG  ED ECG REPORT I, Lucillie Garfinkel, the attending physician, personally viewed and interpreted this ECG.   Date: 06/25/2022  EKG Time: 0812  Rate: 60  Rhythm: sinus; rbbb lafb  Axis: nl  Intervals: lafb and rbbb  ST&T Change: No acute ischemic changes, morphology similar to prior EKG obtained in October 2023.    RADIOLOGY I independently reviewed and interpreted head without contrast see no obvious bleed or midline  shift   PROCEDURES:  Critical Care performed: No  Procedures   MEDICATIONS ORDERED IN ED: Medications  sodium chloride 0.9 % bolus 1,000 mL (0 mLs Intravenous Stopped 06/25/22 1025)  ondansetron (ZOFRAN) injection 4 mg (4 mg Intravenous Given 06/25/22 0837)  acetaminophen (TYLENOL) tablet 1,000 mg (1,000 mg Oral Given 06/25/22 0856)    IMPRESSION / MDM / ASSESSMENT AND PLAN / ED COURSE  I reviewed the triage vital signs and the nursing notes.                                Patient's presentation is most consistent with acute presentation with potential threat to life or bodily function.  Differential diagnosis includes, but is not limited to, cardiac dysrhythmia, ACS, CVA, vasovagal syncope or orthostatic lightheadedness in the setting of metabolic disturbance, AKI, dehydration.  Considered infection but less likely given no infectious symptoms preceding.  This is a patient with most likely vasovagal or orthostatic lightheadedness, perhaps an element of dehydration given her clinical exam and history of poor p.o. intake the day preceding.  Less likely cardiogenic or neurologic given benign exam and no other red flag symptoms as above in history.  Will give fluids, get basic labs including electrolytes and thyroid studies, as well as traumatic workup for head or neck injury given her fall and head strike with a CT scan of the head and neck.   The patient is on the cardiac monitor to evaluate for evidence of arrhythmia and/or significant heart rate changes.   I considered hospitalization for admission or observation for syncope but given that the patient is now asymptomatic during the duration of her hospitalization, now ambulating without any symptoms in the emergency department after a liter of fluids, flat troponins and nonischemic EKG with blood work within normal limits, outpatient follow-up and discharge home with close monitoring of symptoms is most appropriate at this time.   Return precautions given        FINAL CLINICAL IMPRESSION(S) / ED DIAGNOSES   Final diagnoses:  Syncope and collapse  Injury of head, initial encounter     Rx / DC Orders   ED Discharge Orders     None        Note:  This document was prepared using Dragon voice recognition software and may include unintentional dictation  errors.    Lucillie Garfinkel, MD 06/25/22 1113

## 2022-06-25 NOTE — ED Notes (Signed)
Patient ambulated to bathroom without assistance. Patient did states she felt "a little off balance" but states she is suppose to be going to physical therapy for same and is not a new problem. Harless Litten, MD aware

## 2022-06-25 NOTE — ED Triage Notes (Signed)
Patient to ED via [facility] from home for a syncopal episode after getting out of bed this AM to use the restroom. Patient states she hit the back of her head- pain 7/10. Alert and oriented at this time. Denies blood thinners.

## 2022-06-30 ENCOUNTER — Encounter: Payer: Medicare Other | Admitting: Physical Therapy

## 2022-07-02 ENCOUNTER — Encounter: Payer: Medicare Other | Admitting: Physical Therapy

## 2022-07-02 ENCOUNTER — Other Ambulatory Visit: Payer: Self-pay | Admitting: Internal Medicine

## 2022-07-02 DIAGNOSIS — D485 Neoplasm of uncertain behavior of skin: Secondary | ICD-10-CM | POA: Diagnosis not present

## 2022-07-02 DIAGNOSIS — D2372 Other benign neoplasm of skin of left lower limb, including hip: Secondary | ICD-10-CM | POA: Diagnosis not present

## 2022-07-02 DIAGNOSIS — C44729 Squamous cell carcinoma of skin of left lower limb, including hip: Secondary | ICD-10-CM | POA: Diagnosis not present

## 2022-07-04 DIAGNOSIS — M5416 Radiculopathy, lumbar region: Secondary | ICD-10-CM | POA: Diagnosis not present

## 2022-07-05 IMAGING — RF DG SWALLOWING FUNCTION
15 of 20 series · 15 of 24 positions shown · non-contrast
Comparison: None Available.

CLINICAL DATA: Dysphagia. Regurgitation of pills and liquids.

EXAM:
MODIFIED BARIUM SWALLOW
TECHNIQUE: Different consistencies of barium were administered orally to the
patient by the Speech Pathologist. Imaging of the pharynx was
performed in the lateral projection. The radiologist was present in
the fluoroscopy room for this study, providing personal supervision.
FLUOROSCOPY:
Radiation Exposure Index (as provided by the fluoroscopic device):
55.7 mGy

[Series 1: run · 1 of 22 frames shown (1 of 15)]
[frame 4/22]
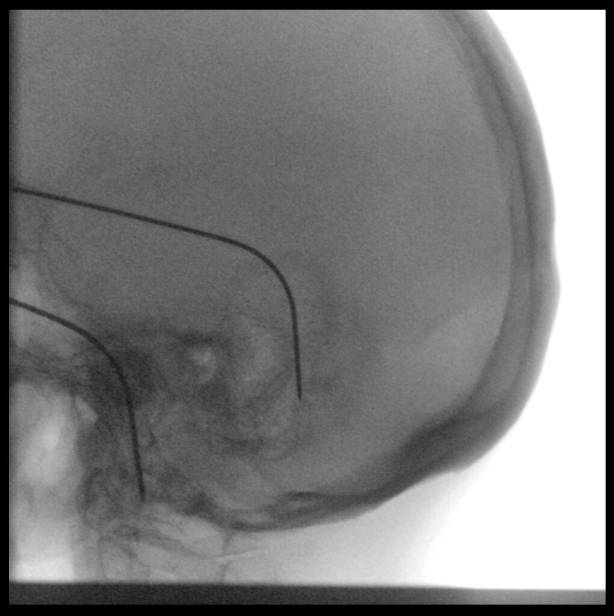

[Series 3: run · 1 of 22 frames shown (2 of 15)]
[frame 3/22]
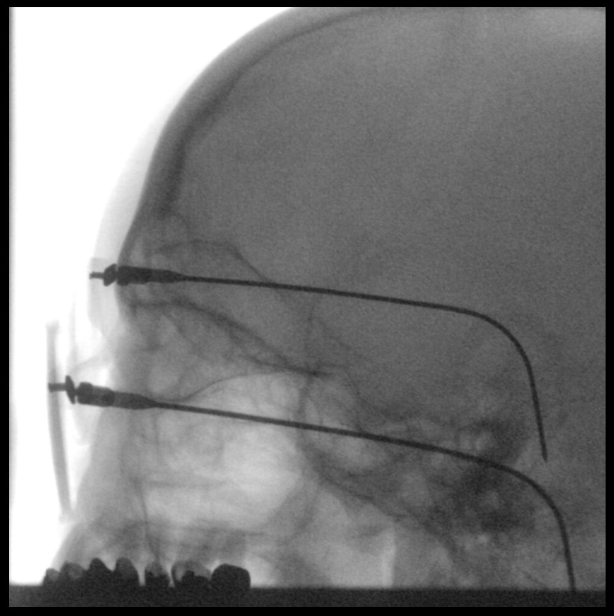

[Series 4: run · 1 of 20 frames shown (3 of 15)]
[frame 20/20]
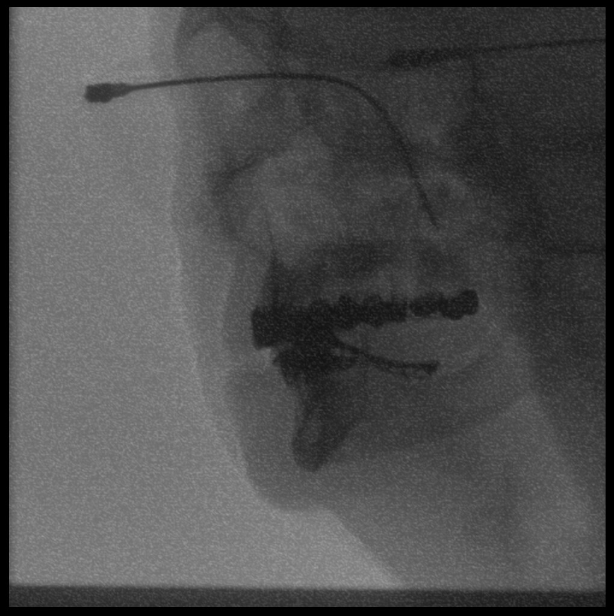

[Series 6: run · 1 of 64 frames shown (4 of 15)]
[frame 10/64]
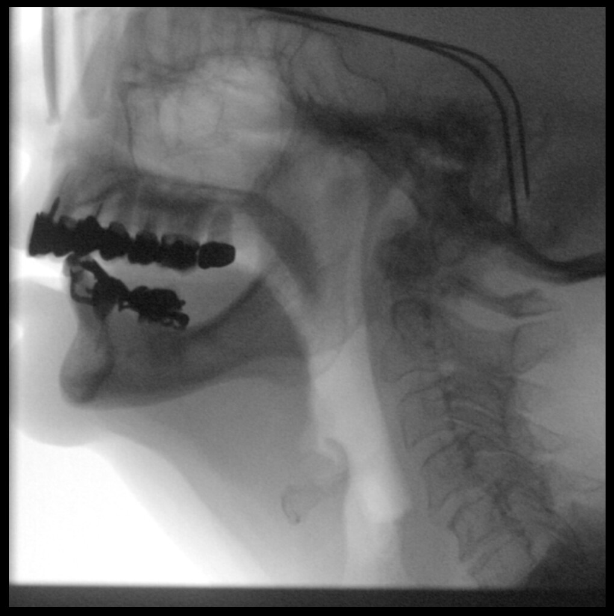

[Series 7: run · 1 of 56 frames shown (5 of 15)]
[frame 48/56]
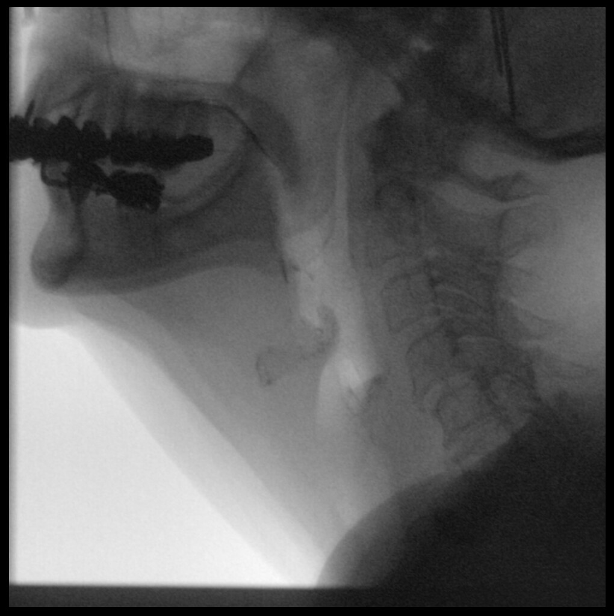

[Series 8: run · 1 of 193 frames shown (6 of 15)]
[frame 97/193]
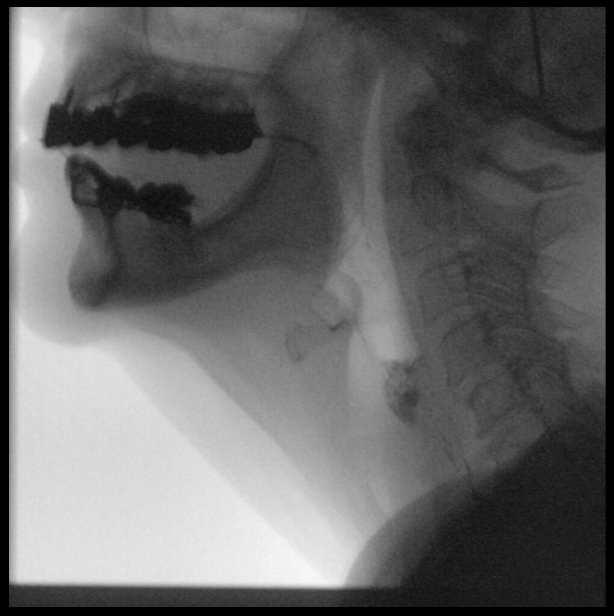

[Series 10: run · 1 of 363 frames shown (7 of 15)]
[frame 182/363]
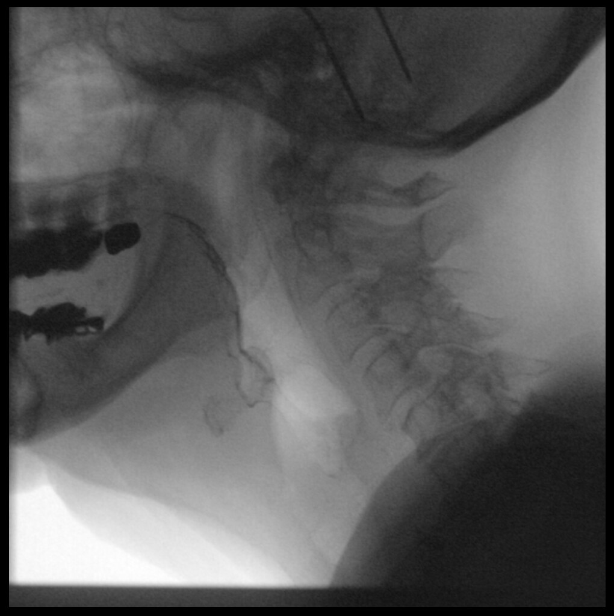

[Series 12: run · 1 of 339 frames shown (8 of 15)]
[frame 51/339]
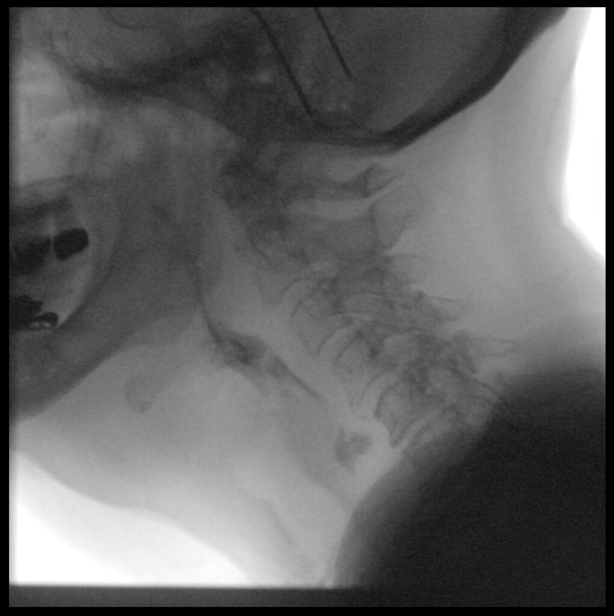

[Series 13: run · 1 of 26 frames shown (9 of 15)]
[frame 1/26]
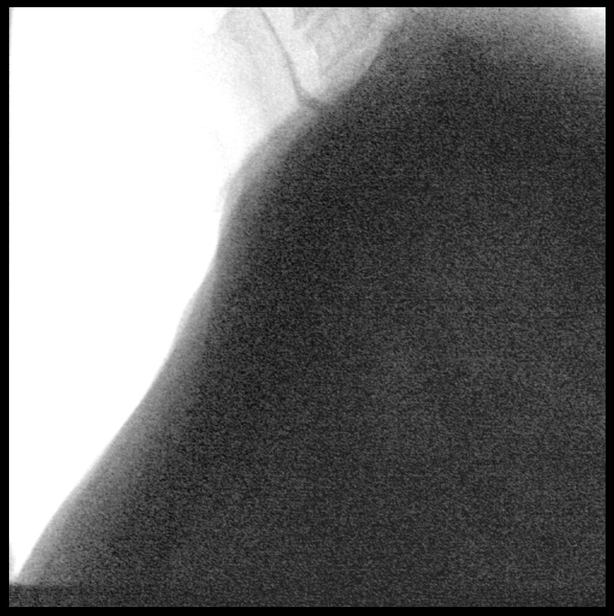

[Series 14: run · 1 of 74 frames shown (10 of 15)]
[frame 63/74]
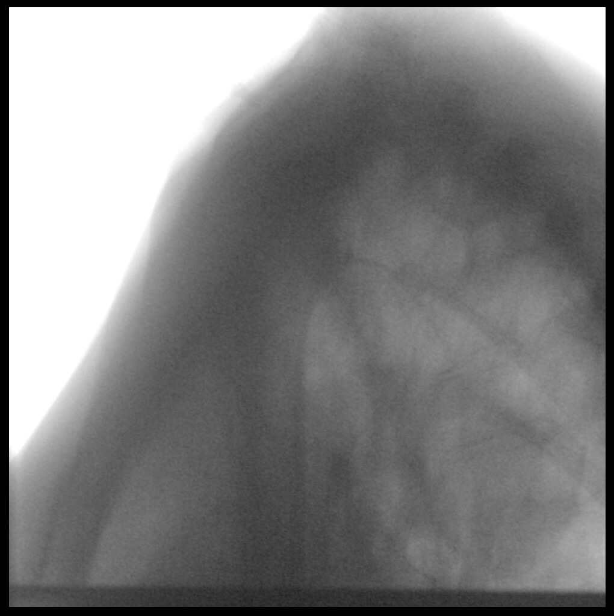

[Series 15: run · 1 of 50 frames shown (11 of 15)]
[frame 32/50]
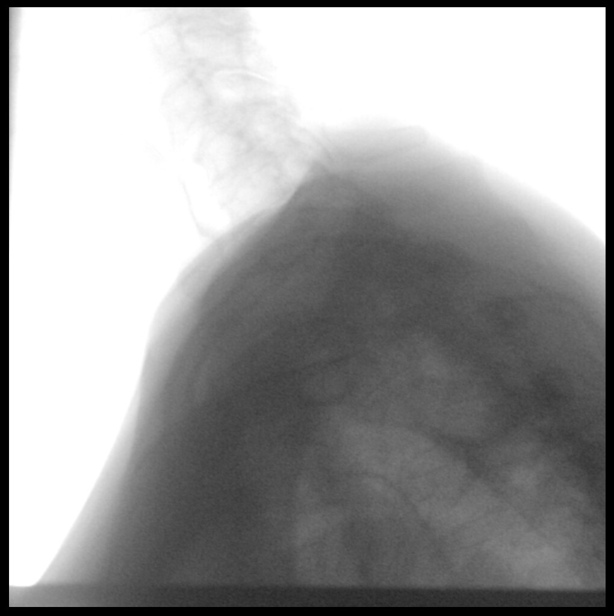

[Series 17: run · 1 of 274 frames shown (12 of 15)]
[frame 138/274]
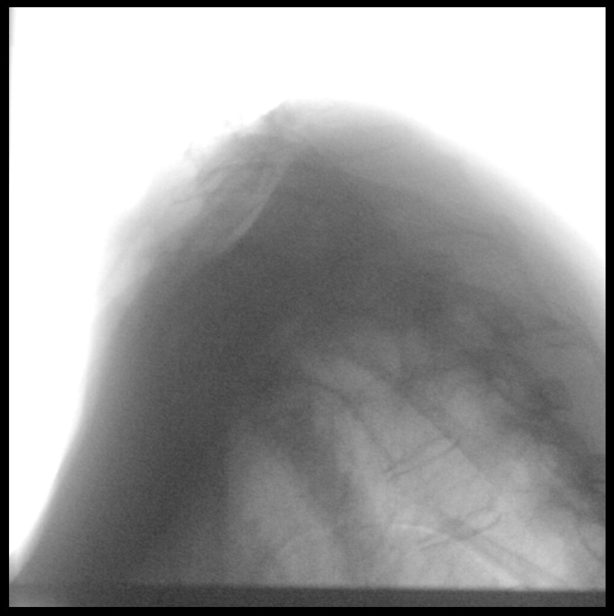

[Series 19: run · 1 of 454 frames shown (13 of 15)]
[frame 33/454]
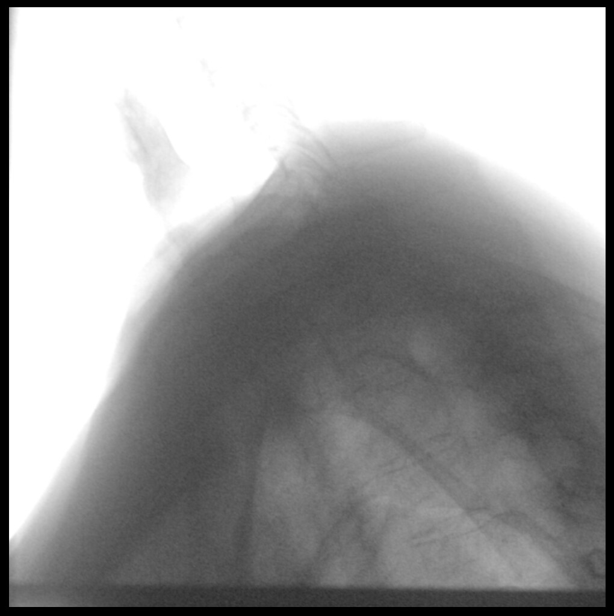

[Series 20: run · 1 of 174 frames shown (14 of 15)]
[frame 3/174]
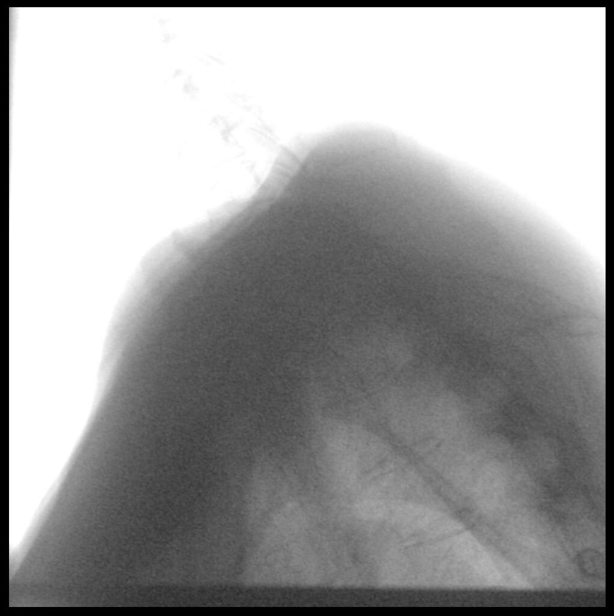

[Series 21: run · 1 of 187 frames shown (15 of 15)]
[frame 159/187]
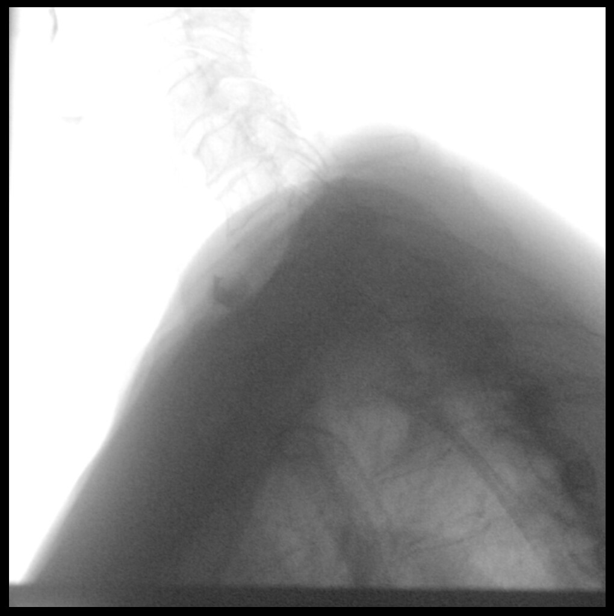

[15 of 24 positions shown; findings below may reference images not displayed]

FINDINGS: Vestibular  Penetration:  None seen.

Aspiration:  None seen.

Other:  Possible Jinmyung Awank diverticulum.
IMPRESSION: Modified barium swallow as described above.

Please refer to the Speech Pathologists report for complete details
and recommendations.

## 2022-07-07 ENCOUNTER — Encounter: Payer: Medicare Other | Admitting: Physical Therapy

## 2022-07-09 ENCOUNTER — Encounter: Payer: Medicare Other | Admitting: Physical Therapy

## 2022-07-09 DIAGNOSIS — D0472 Carcinoma in situ of skin of left lower limb, including hip: Secondary | ICD-10-CM | POA: Diagnosis not present

## 2022-07-09 DIAGNOSIS — C44729 Squamous cell carcinoma of skin of left lower limb, including hip: Secondary | ICD-10-CM | POA: Diagnosis not present

## 2022-07-14 ENCOUNTER — Encounter: Payer: Medicare Other | Admitting: Physical Therapy

## 2022-07-16 ENCOUNTER — Encounter: Payer: Medicare Other | Admitting: Physical Therapy

## 2022-07-21 ENCOUNTER — Encounter: Payer: Medicare Other | Admitting: Physical Therapy

## 2022-07-23 ENCOUNTER — Encounter: Payer: Medicare Other | Admitting: Physical Therapy

## 2022-07-29 DIAGNOSIS — M5416 Radiculopathy, lumbar region: Secondary | ICD-10-CM | POA: Diagnosis not present

## 2022-08-01 ENCOUNTER — Other Ambulatory Visit: Payer: Self-pay | Admitting: Internal Medicine

## 2022-08-06 ENCOUNTER — Encounter: Payer: Self-pay | Admitting: Internal Medicine

## 2022-08-06 ENCOUNTER — Ambulatory Visit (INDEPENDENT_AMBULATORY_CARE_PROVIDER_SITE_OTHER): Payer: Medicare Other | Admitting: Internal Medicine

## 2022-08-06 VITALS — BP 146/74 | HR 80 | Temp 98.2°F | Ht 65.0 in | Wt 143.2 lb

## 2022-08-06 DIAGNOSIS — E032 Hypothyroidism due to medicaments and other exogenous substances: Secondary | ICD-10-CM

## 2022-08-06 DIAGNOSIS — E538 Deficiency of other specified B group vitamins: Secondary | ICD-10-CM

## 2022-08-06 DIAGNOSIS — N182 Chronic kidney disease, stage 2 (mild): Secondary | ICD-10-CM

## 2022-08-06 DIAGNOSIS — E559 Vitamin D deficiency, unspecified: Secondary | ICD-10-CM

## 2022-08-06 DIAGNOSIS — R0789 Other chest pain: Secondary | ICD-10-CM

## 2022-08-06 DIAGNOSIS — E042 Nontoxic multinodular goiter: Secondary | ICD-10-CM | POA: Diagnosis not present

## 2022-08-06 DIAGNOSIS — D649 Anemia, unspecified: Secondary | ICD-10-CM | POA: Diagnosis not present

## 2022-08-06 DIAGNOSIS — C4492 Squamous cell carcinoma of skin, unspecified: Secondary | ICD-10-CM | POA: Diagnosis not present

## 2022-08-06 DIAGNOSIS — E1142 Type 2 diabetes mellitus with diabetic polyneuropathy: Secondary | ICD-10-CM | POA: Diagnosis not present

## 2022-08-06 DIAGNOSIS — K225 Diverticulum of esophagus, acquired: Secondary | ICD-10-CM

## 2022-08-06 DIAGNOSIS — E1169 Type 2 diabetes mellitus with other specified complication: Secondary | ICD-10-CM

## 2022-08-06 DIAGNOSIS — L281 Prurigo nodularis: Secondary | ICD-10-CM | POA: Diagnosis not present

## 2022-08-06 DIAGNOSIS — E785 Hyperlipidemia, unspecified: Secondary | ICD-10-CM | POA: Diagnosis not present

## 2022-08-06 DIAGNOSIS — D472 Monoclonal gammopathy: Secondary | ICD-10-CM

## 2022-08-06 DIAGNOSIS — Z862 Personal history of diseases of the blood and blood-forming organs and certain disorders involving the immune mechanism: Secondary | ICD-10-CM | POA: Diagnosis not present

## 2022-08-06 DIAGNOSIS — R600 Localized edema: Secondary | ICD-10-CM

## 2022-08-06 DIAGNOSIS — D126 Benign neoplasm of colon, unspecified: Secondary | ICD-10-CM | POA: Diagnosis not present

## 2022-08-06 DIAGNOSIS — F418 Other specified anxiety disorders: Secondary | ICD-10-CM

## 2022-08-06 MED ORDER — CYANOCOBALAMIN 1000 MCG/ML IJ SOLN
1000.0000 ug | Freq: Once | INTRAMUSCULAR | Status: AC
  Administered 2022-08-06: 1000 ug via INTRAMUSCULAR

## 2022-08-06 MED ORDER — EZETIMIBE 10 MG PO TABS: 10.0000 mg | ORAL_TABLET | Freq: Every day | ORAL | 3 refills | Status: DC

## 2022-08-06 NOTE — Progress Notes (Unsigned)
Subjective:  Patient ID: Megan Bradley, female    DOB: [birth year]  Age: 82 y.o. MRN: [id number]  CC: The primary encounter diagnosis was Type 2 DM with diabetic neuropathy affecting both sides of body ([place]). Diagnoses of Hyperlipidemia associated with type 2 diabetes mellitus ([place]), History of pernicious anemia, Iatrogenic hypothyroidism, B12 deficiency, Vitamin D deficiency, Anemia, unspecified type, Zenkers diverticulum, Tubular adenoma of colon, Squamous cell skin cancer, Prurigo nodularis, Multinodular goiter, MGUS (monoclonal gammopathy of unknown significance), Bilateral lower extremity edema, Other chest pain, Stage 2 chronic kidney disease, and Anxiety with obsessional features were also pertinent to this visit.   HPI Megan Bradley presents for  Chief Complaint  Patient presents with  . Medical Management of Chronic Issues    Follow up on diabetes    1) Diabetes Mellitus type 2 .  She  feels generally well,  But is not exercising regularly but is participating in physical therapy.  She is not overweight. . Checking  blood sugars less than once daily at variable times, ore frequently  if she feels she may be having a hypoglycemic event. .  BS have been under 130 fasting and < 150 post prandially.  Denies any recent hypoglyemic events.  Taking   medications as directed. Following a carbohydrate modified diet 6 days per week. Denies numbness, burning and tingling of extremities. Appetite is good.    2)Recurrent falls:  she has had 3  falls 3 since last visit. Reviewed ER visit for syncope and fall  which occurred on  Jan 17th which occurred after getting up during the night to get a drink of water      3) lower back pain : she is seeing Krasinksi at [facility],  had an MRI .  Received ESI which helped only for a few days  taking tylenol 3 times daily.   She is participating in PT to address her poor balance and low back pain .  She has worn a ZIO monitor to rule out arrhythmias as the  cause of her recurrent syncope.  Hypotension has been causative.   4) Corkscrew esophagu:  she changed GI providers;  had a diagnostic  EGD by [facility]: stretching was advised but not done because of a diverticulum in esophagus. She was offered a referral to academic center but deferred .    5) Going to PT to address balance and low back pain   6) MGUS:  followed by Janese Banks.  No progression seen   7) Edema:  noted that it resolved with prednisone   8) RAsh:  taking  Dupixent injections regularly for prurigo nodularis .  Recently had Mohs surgery by  Anne Fu for skin cancers  9) Hypotension : Blood pressure has been labile , as high as 160/90 but mostly 130/68.  Improved with increased salt intake   10) Worried about use of generic Synthroid  based on a talk  from a  college student   11) anxiety:  she remains anxious about her health but limits alprazolam use to bedtime to manage insomnia/   Outpatient Medications Prior to Visit  Medication Sig Dispense Refill  . ALPRAZolam (XANAX) 0.5 MG tablet Take 1 tablet by mouth twice daily as needed for anxiety 60 tablet 5  . DUPIXENT 300 MG/2ML SOPN     . ergocalciferol (DRISDOL) 1.25 MG (50000 UT) capsule Take 1 capsule (50,000 Units total) by mouth once a week. 12 capsule 0  . furosemide (LASIX) 20 MG tablet TAKE  1 TABLET BY MOUTH  DAILY AS NEEDED FOR  SWELLING/FLUID RETENTION 90 tablet 3  . glucose blood (ACCU-CHEK GUIDE) test strip Use to check blood sugars once daily. Dx Code: E11.9 100 each 3  . hydrOXYzine (VISTARIL) 25 MG capsule TAKE 1 CAPSULE BY MOUTH EVERY 8 HOURS AS NEEDED 90 capsule 0  . hyoscyamine (LEVSIN SL) 0.125 MG SL tablet DISSOLVE 1 TABLET IN MOUTH EVERY 4 HOURS AS NEEDED 90 tablet 1  . isosorbide mononitrate (IMDUR) 30 MG 24 hr tablet Take 1 tablet (30 mg) by mouth once daily in the evening 90 tablet 3  . levothyroxine (SYNTHROID) 112 MCG tablet TAKE 1 TABLET BY MOUTH  DAILY BEFORE BREAKFAST 90 tablet 3  . meclizine  (ANTIVERT) 25 MG tablet TAKE 1 TABLET BY MOUTH THREE TIMES DAILY AS NEEDED FOR VERTIGO 30 tablet 0  . omeprazole (PRILOSEC) 40 MG capsule Take 1 capsule (40 mg total) by mouth 2 (two) times daily. 180 capsule 3  . potassium chloride (KLOR-CON) 10 MEQ tablet Take 1 tablet (10 mEq total) by mouth daily as needed (with lasix). 30 tablet 3  . rosuvastatin (CRESTOR) 20 MG tablet TAKE 1 TABLET BY MOUTH EVERY  OTHER DAY 45 tablet 3  . sertraline (ZOLOFT) 100 MG tablet TAKE 2 TABLETS BY MOUTH DAILY 180 tablet 3  . ezetimibe (ZETIA) 10 MG tablet TAKE 1 TABLET BY MOUTH  DAILY 90 tablet 3   Facility-Administered Medications Prior to Visit  Medication Dose Route Frequency Provider Last Rate Last Admin  . clindamycin (CLEOCIN) IVPB 600 mg  600 mg Intravenous Once [place], MD        Review of Systems;  Patient denies headache, fevers, malaise, unintentional weight loss eye pain, sinus congestion and sinus pain, sore throat,  hemoptysis , cough, dyspnea, wheezing, chest pain, palpitations, orthopnea, edema, abdominal pain, nausea, melena, diarrhea, constipation, flank pain, dysuria, hematuria, urinary  Frequency, nocturia, numbness, tingling, seizures,  Focal weakness, Tremor, depression, and suicidal ideation.      Objective:  BP (!) 146/74   Pulse 80   Temp 98.2 F (36.8 C) (Oral)   Ht '5\' 5"'$  (1.651 m)   Wt 143 lb 3.2 oz (65 kg)   SpO2 96%   BMI 23.83 kg/m   BP Readings from Last 3 Encounters:  08/06/22 (!) 146/74  06/25/22 105/70  05/15/22 132/82    Wt Readings from Last 3 Encounters:  08/06/22 143 lb 3.2 oz (65 kg)  06/25/22 139 lb (63 kg)  05/15/22 142 lb 12.8 oz (64.8 kg)    Physical Exam Vitals reviewed.  Constitutional:      General: She is not in acute distress.    Appearance: Normal appearance. She is normal weight. She is not ill-appearing, toxic-appearing or diaphoretic.  HENT:     Head: Normocephalic.  Eyes:     General: No scleral icterus.       Right eye: No  discharge.        Left eye: No discharge.     Conjunctiva/sclera: Conjunctivae normal.  Cardiovascular:     Rate and Rhythm: Normal rate and regular rhythm.     Heart sounds: Normal heart sounds.  Pulmonary:     Effort: Pulmonary effort is normal. No respiratory distress.     Breath sounds: Normal breath sounds.  Musculoskeletal:        General: Normal range of motion.  Skin:    General: Skin is warm and dry.     Findings: Erythema and rash present.  Comments:  Confluent erythema of anterior surface of both shins,  improved compared with previous exam   Neurological:     General: No focal deficit present.     Mental Status: She is alert and oriented to person, place, and time. Mental status is at baseline.  Psychiatric:        Mood and Affect: Mood normal.        Behavior: Behavior normal.        Thought Content: Thought content normal.        Judgment: Judgment normal.   Lab Results  Component Value Date   HGBA1C 7.0 (H) 05/05/2022   HGBA1C 7.2 (H) 01/29/2022   HGBA1C 6.4 (A) 10/29/2021    Lab Results  Component Value Date   CREATININE 1.01 (H) 06/25/2022   CREATININE 0.91 05/20/2022   CREATININE 0.94 05/15/2022    Lab Results  Component Value Date   WBC 8.0 06/25/2022   HGB 11.1 (L) 06/25/2022   HCT 34.7 (L) 06/25/2022   PLT 173 06/25/2022   GLUCOSE 117 (H) 06/25/2022   CHOL 136 01/29/2022   TRIG 41.0 01/29/2022   HDL 73.70 01/29/2022   LDLDIRECT 93.0 01/02/2016   LDLCALC 55 01/29/2022   ALT 21 06/25/2022   AST 26 06/25/2022   NA 138 06/25/2022   K 4.4 06/25/2022   CL 106 06/25/2022   CREATININE 1.01 (H) 06/25/2022   BUN 25 (H) 06/25/2022   CO2 23 06/25/2022   TSH 25.369 (H) 06/25/2022   INR 1.0 09/26/2020   HGBA1C 7.0 (H) 05/05/2022   MICROALBUR 1.0 07/30/2021    CT Head Wo Contrast  Result Date: 06/25/2022 CLINICAL DATA:  Head trauma, minor (Age >= 65y); Neck trauma (Age >= 65y). Syncope. EXAM: CT HEAD WITHOUT CONTRAST CT CERVICAL SPINE  WITHOUT CONTRAST TECHNIQUE: Multidetector CT imaging of the head and cervical spine was performed following the standard protocol without intravenous contrast. Multiplanar CT image reconstructions of the cervical spine were also generated. RADIATION DOSE REDUCTION: This exam was performed according to the departmental dose-optimization program which includes automated exposure control, adjustment of the mA and/or kV according to patient size and/or use of iterative reconstruction technique. COMPARISON:  CT head, maxillofacial, and cervical spine 05/08/2020 FINDINGS: CT HEAD FINDINGS Brain: There is no evidence of an acute infarct, intracranial hemorrhage, mass, midline shift, or extra-axial fluid collection. The ventricles and sulci are within normal limits for age. Vascular: Calcified atherosclerosis at the skull base. No hyperdense vessel. Skull: No acute fracture or suspicious osseous lesion. Sinuses/Orbits: Paranasal sinuses and mastoid air cells are clear. Bilateral cataract extraction. Other: None. CT CERVICAL SPINE FINDINGS Alignment: Mild chronic reversal of the normal cervical lordosis. Unchanged trace anterolisthesis of C4 on C5. Skull base and vertebrae: No acute fracture or suspicious osseous lesion. Moderate median C1-2 arthropathy. Soft tissues and spinal canal: No prevertebral fluid or swelling. No visible canal hematoma. Disc levels: Multilevel disc degeneration, moderate at C5-6 and C6-7. Asymmetrically severe facet arthrosis on the right at C2-3 and C4-5 and on the left at C3-4. No evidence of high-grade stenosis. Upper chest: No mass or consolidation in the included lung apices. Other: None. IMPRESSION: 1. No evidence of acute intracranial abnormality. 2. No acute cervical spine fracture. Electronically Signed   By: Logan Bores M.D.   On: 06/25/2022 09:38   CT Cervical Spine Wo Contrast  Result Date: 06/25/2022 CLINICAL DATA:  Head trauma, minor (Age >= 65y); Neck trauma (Age >= 65y).  Syncope. EXAM: CT HEAD WITHOUT CONTRAST  CT CERVICAL SPINE WITHOUT CONTRAST TECHNIQUE: Multidetector CT imaging of the head and cervical spine was performed following the standard protocol without intravenous contrast. Multiplanar CT image reconstructions of the cervical spine were also generated. RADIATION DOSE REDUCTION: This exam was performed according to the departmental dose-optimization program which includes automated exposure control, adjustment of the mA and/or kV according to patient size and/or use of iterative reconstruction technique. COMPARISON:  CT head, maxillofacial, and cervical spine 05/08/2020 FINDINGS: CT HEAD FINDINGS Brain: There is no evidence of an acute infarct, intracranial hemorrhage, mass, midline shift, or extra-axial fluid collection. The ventricles and sulci are within normal limits for age. Vascular: Calcified atherosclerosis at the skull base. No hyperdense vessel. Skull: No acute fracture or suspicious osseous lesion. Sinuses/Orbits: Paranasal sinuses and mastoid air cells are clear. Bilateral cataract extraction. Other: None. CT CERVICAL SPINE FINDINGS Alignment: Mild chronic reversal of the normal cervical lordosis. Unchanged trace anterolisthesis of C4 on C5. Skull base and vertebrae: No acute fracture or suspicious osseous lesion. Moderate median C1-2 arthropathy. Soft tissues and spinal canal: No prevertebral fluid or swelling. No visible canal hematoma. Disc levels: Multilevel disc degeneration, moderate at C5-6 and C6-7. Asymmetrically severe facet arthrosis on the right at C2-3 and C4-5 and on the left at C3-4. No evidence of high-grade stenosis. Upper chest: No mass or consolidation in the included lung apices. Other: None. IMPRESSION: 1. No evidence of acute intracranial abnormality. 2. No acute cervical spine fracture. Electronically Signed   By: Logan Bores M.D.   On: 06/25/2022 09:38    Assessment & Plan:  .Type 2 DM with diabetic neuropathy affecting both sides  of body [facility]) Assessment & Plan: Thus far she has maintained Reasonable control for age without medications.    Poor balance is in part due to diabetes but aggravated by deconditioning and proximal muscle weakness.   Lab Results  Component Value Date   HGBA1C 7.0 (H) 05/05/2022   Lab Results  Component Value Date   MICROALBUR 1.0 07/30/2021   MICROALBUR <0.7 01/16/2021      Orders: -     Hemoglobin A1c -     Comprehensive metabolic panel -     Microalbumin / creatinine urine ratio; Future  Hyperlipidemia associated with type 2 diabetes mellitus ([place]) Assessment & Plan: Managed with daily Zetia and QOD rosuvastatin per patient preference.  A1c has been repeated due to previous elevation to 7.0   Lab Results  Component Value Date   HGBA1C 7.0 (H) 05/05/2022    Lab Results  Component Value Date   MICROALBUR 1.0 07/30/2021   MICROALBUR <0.7 01/16/2021       Lab Results  Component Value Date   CHOL 136 01/29/2022   HDL 73.70 01/29/2022   LDLCALC 55 01/29/2022   LDLDIRECT 93.0 01/02/2016   TRIG 41.0 01/29/2022   CHOLHDL 2 01/29/2022   Lab Results  Component Value Date   ALT 21 06/25/2022   AST 26 06/25/2022   ALKPHOS 49 06/25/2022   BILITOT 1.4 (H) 06/25/2022     Orders: -     Lipid panel -     LDL cholesterol, direct  History of pernicious anemia Assessment & Plan: If antibody positive  Lab Results  Component Value Date   VITAMINB12 384 05/20/2022      Iatrogenic hypothyroidism Assessment & Plan: Recent TSH done in ER was very high, in the setting of syncope.  Repeating today.  Currently taking 112 mcg levothyroxine   Lab Results  Component Value  Date   TSH 25.369 (H) 06/25/2022      Orders: -     TSH  B12 deficiency -     Vitamin B12 -     Cyanocobalamin  Vitamin D deficiency -     VITAMIN D 25 Hydroxy (Vit-D Deficiency, Fractures)  Anemia, unspecified type Assessment & Plan: Mild, iron stores are normal.  Has treated b12  deficiency and  MGUS   Lab Results  Component Value Date   WBC 8.0 06/25/2022   HGB 11.1 (L) 06/25/2022   HCT 34.7 (L) 06/25/2022   MCV 93.0 06/25/2022   PLT 173 06/25/2022     Lab Results  Component Value Date   VITAMINB12 384 05/20/2022   Lab Results  Component Value Date   IRON 58 08/06/2022   TIBC 304 08/06/2022   FERRITIN 183 08/06/2022   Worsening by repeat labs.   Orders: -     CBC with Differential/Platelet -     Iron, TIBC and Ferritin Panel  Zenkers diverticulum Assessment & Plan: Noted on recent EGD.  Referral to tertiary care center for esophageal dilation deferred by patient    Tubular adenoma of colon Assessment & Plan: 5 yr follow up colonsocopy due in 2024 and will be done by Dr Modena Nunnery.    Squamous cell skin cancer Assessment & Plan: Recently removed from right shin by [place]. She is avoiding the sun religiously   Prurigo nodularis Assessment & Plan: Advised to moisturize her skin 3 times daily to  managed the xerosis caused by her medication (Dupixent)   Multinodular goiter Assessment & Plan: Serial annular ultrasounds have shown stable nodules.  Thyroid level was VERY underactive durig recent ER visit  for syncope and was not addressed by ER physician. Repeating today. Currently taking generic Synthroid with no recent lapses in medication   Lab Results  Component Value Date   TSH 25.369 (H) 06/25/2022      MGUS (monoclonal gammopathy of unknown significance) Assessment & Plan: Repeated every 6 months.  Follow up with Dr Janese Banks    Bilateral lower extremity edema Assessment & Plan: Currently resolved   Other chest pain Assessment & Plan: No recent occurrences.  No history of CAD.  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  She reports improved self esteem and mood today    Other orders -     Ezetimibe; Take 1 tablet (10 mg total) by mouth daily.  Dispense: 90 tablet; Refill: 3    Follow-up: Return in about 6 months (around 02/04/2023).   Crecencio Mc, MD

## 2022-08-06 NOTE — Patient Instructions (Addendum)
Medicare Annual Wellness visit is due around 10/25/2022, please schedule this appointment at checkout.   Good to see you!  I have ordered everything but the MGUS that Dr Janese Banks has been checking   BP is fine if most readings are 140/90 or less

## 2022-08-06 NOTE — Assessment & Plan Note (Signed)
If antibody positive  Lab Results  Component Value Date   VITAMINB12 384 05/20/2022

## 2022-08-07 LAB — LIPID PANEL
Cholesterol: 135 mg/dL (ref 0–200)
HDL: 74.4 mg/dL (ref >39.00)
LDL Cholesterol: 50 mg/dL (ref 0–99)
NonHDL: 60.64
Total CHOL/HDL Ratio: 2
Triglycerides: 51 mg/dL (ref 0.0–149.0)
VLDL: 10.2 mg/dL (ref 0.0–40.0)

## 2022-08-07 LAB — CBC WITH DIFFERENTIAL/PLATELET
Basophils Absolute: 0.1 10*3/uL (ref 0.0–0.1)
Basophils Relative: 0.7 % (ref 0.0–3.0)
Eosinophils Absolute: 0.1 10*3/uL (ref 0.0–0.7)
Eosinophils Relative: 1.4 % (ref 0.0–5.0)
HCT: 33.3 % — ABNORMAL LOW (ref 36.0–46.0)
Hemoglobin: 11.1 g/dL — ABNORMAL LOW (ref 12.0–15.0)
Lymphocytes Relative: 19.4 % (ref 12.0–46.0)
Lymphs Abs: 1.5 10*3/uL (ref 0.7–4.0)
MCHC: 33.2 g/dL (ref 30.0–36.0)
MCV: 92.6 fl (ref 78.0–100.0)
Monocytes Absolute: 0.4 10*3/uL (ref 0.1–1.0)
Monocytes Relative: 4.7 % (ref 3.0–12.0)
Neutro Abs: 5.6 10*3/uL (ref 1.4–7.7)
Neutrophils Relative %: 73.8 % (ref 43.0–77.0)
Platelets: 153 10*3/uL (ref 150.0–400.0)
RBC: 3.6 Mil/uL — ABNORMAL LOW (ref 3.87–5.11)
RDW: 21.1 % — ABNORMAL HIGH (ref 11.5–15.5)
WBC: 7.6 10*3/uL (ref 4.0–10.5)

## 2022-08-07 LAB — IRON,TIBC AND FERRITIN PANEL
%SAT: 19 % (calc) (ref 16–45)
Ferritin: 183 ng/mL (ref 16–288)
Iron: 58 ug/dL (ref 45–160)
TIBC: 304 mcg/dL (calc) (ref 250–450)

## 2022-08-07 LAB — MICROALBUMIN / CREATININE URINE RATIO
Creatinine,U: 105.4 mg/dL
Microalb Creat Ratio: 1.5 mg/g (ref 0.0–30.0)
Microalb, Ur: 1.5 mg/dL (ref 0.0–1.9)

## 2022-08-07 LAB — COMPREHENSIVE METABOLIC PANEL
ALT: 15 U/L (ref 0–35)
AST: 18 U/L (ref 0–37)
Albumin: 3.8 g/dL (ref 3.5–5.2)
Alkaline Phosphatase: 60 U/L (ref 39–117)
BUN: 15 mg/dL (ref 6–23)
CO2: 29 mEq/L (ref 19–32)
Calcium: 9.4 mg/dL (ref 8.4–10.5)
Chloride: 104 mEq/L (ref 96–112)
Creatinine, Ser: 0.87 mg/dL (ref 0.40–1.20)
GFR: 63.09 mL/min (ref >60.00)
Glucose, Bld: 81 mg/dL (ref 70–99)
Potassium: 3.7 mEq/L (ref 3.5–5.1)
Sodium: 141 mEq/L (ref 135–145)
Total Bilirubin: 0.5 mg/dL (ref 0.2–1.2)
Total Protein: 6.7 g/dL (ref 6.0–8.3)

## 2022-08-07 LAB — VITAMIN B12: Vitamin B-12: 493 pg/mL (ref 211–911)

## 2022-08-07 LAB — HEMOGLOBIN A1C: Hgb A1c MFr Bld: 7.4 % — ABNORMAL HIGH (ref 4.6–6.5)

## 2022-08-07 LAB — LDL CHOLESTEROL, DIRECT: Direct LDL: 38 mg/dL

## 2022-08-07 LAB — TSH: TSH: 1.42 u[IU]/mL (ref 0.35–5.50)

## 2022-08-07 LAB — VITAMIN D 25 HYDROXY (VIT D DEFICIENCY, FRACTURES): VITD: 34.26 ng/mL (ref 30.00–100.00)

## 2022-08-07 NOTE — Assessment & Plan Note (Signed)
Recent TSH done in ER was very high, in the setting of syncope.  Repeating today.  Currently taking 112 mcg levothyroxine   Lab Results  Component Value Date   TSH 25.369 (H) 06/25/2022

## 2022-08-07 NOTE — Assessment & Plan Note (Signed)
Advised to moisturize her skin 3 times daily to  managed the xerosis caused by her medication (Dupixent)

## 2022-08-07 NOTE — Assessment & Plan Note (Signed)
Improved GFR by Repeat  assessment   Lab Results  Component Value Date   CREATININE 1.01 (H) 06/25/2022

## 2022-08-07 NOTE — Assessment & Plan Note (Signed)
Repeated every 6 months.  Follow up with Dr Janese Banks

## 2022-08-07 NOTE — Assessment & Plan Note (Signed)
Managed with daily Zetia and QOD rosuvastatin per patient preference.  A1c has been repeated due to previous elevation to 7.0   Lab Results  Component Value Date   HGBA1C 7.0 (H) 05/05/2022    Lab Results  Component Value Date   MICROALBUR 1.0 07/30/2021   MICROALBUR <0.7 01/16/2021       Lab Results  Component Value Date   CHOL 136 01/29/2022   HDL 73.70 01/29/2022   LDLCALC 55 01/29/2022   LDLDIRECT 93.0 01/02/2016   TRIG 41.0 01/29/2022   CHOLHDL 2 01/29/2022   Lab Results  Component Value Date   ALT 21 06/25/2022   AST 26 06/25/2022   ALKPHOS 49 06/25/2022   BILITOT 1.4 (H) 06/25/2022

## 2022-08-07 NOTE — Assessment & Plan Note (Signed)
Recently removed from right shin by [facility] [place]. She is avoiding the sun religiously

## 2022-08-07 NOTE — Assessment & Plan Note (Signed)
Her self esteem has been hurt by her skin and breast issues   Referral for counselling has been repeatedly deferred.  She reports improved self esteem and mood today

## 2022-08-07 NOTE — Assessment & Plan Note (Signed)
Currently resolved. °

## 2022-08-07 NOTE — Assessment & Plan Note (Signed)
Mild, iron stores are normal.  Has treated b12 deficiency and  MGUS   Lab Results  Component Value Date   WBC 8.0 06/25/2022   HGB 11.1 (L) 06/25/2022   HCT 34.7 (L) 06/25/2022   MCV 93.0 06/25/2022   PLT 173 06/25/2022     Lab Results  Component Value Date   VITAMINB12 384 05/20/2022   Lab Results  Component Value Date   IRON 58 08/06/2022   TIBC 304 08/06/2022   FERRITIN 183 08/06/2022   Worsening by repeat labs.

## 2022-08-07 NOTE — Assessment & Plan Note (Signed)
Serial annular ultrasounds have shown stable nodules.  Thyroid level was VERY underactive durig recent ER visit  for syncope and was not addressed by ER physician. Repeating today. Currently taking generic Synthroid with no recent lapses in medication   Lab Results  Component Value Date   TSH 25.369 (H) 06/25/2022

## 2022-08-07 NOTE — Assessment & Plan Note (Signed)
Thus far she has maintained Reasonable control for age without medications.    Poor balance is in part due to diabetes but aggravated by deconditioning and proximal muscle weakness.   Lab Results  Component Value Date   HGBA1C 7.0 (H) 05/05/2022   Lab Results  Component Value Date   MICROALBUR 1.0 07/30/2021   MICROALBUR <0.7 01/16/2021

## 2022-08-07 NOTE — Assessment & Plan Note (Addendum)
5 yr follow up colonsocopy due in 2024 and will be done by Dr Modena Nunnery.

## 2022-08-07 NOTE — Assessment & Plan Note (Signed)
No recent occurrences.  No history of CAD.  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

## 2022-08-07 NOTE — Assessment & Plan Note (Signed)
Noted on recent EGD.  Referral to tertiary care center for esophageal dilation deferred by patient

## 2022-08-14 DIAGNOSIS — M5416 Radiculopathy, lumbar region: Secondary | ICD-10-CM | POA: Diagnosis not present

## 2022-08-21 DIAGNOSIS — C44722 Squamous cell carcinoma of skin of right lower limb, including hip: Secondary | ICD-10-CM | POA: Diagnosis not present

## 2022-08-21 DIAGNOSIS — L578 Other skin changes due to chronic exposure to nonionizing radiation: Secondary | ICD-10-CM | POA: Diagnosis not present

## 2022-08-21 DIAGNOSIS — Z85828 Personal history of other malignant neoplasm of skin: Secondary | ICD-10-CM | POA: Diagnosis not present

## 2022-08-21 DIAGNOSIS — L814 Other melanin hyperpigmentation: Secondary | ICD-10-CM | POA: Diagnosis not present

## 2022-08-26 DIAGNOSIS — M5416 Radiculopathy, lumbar region: Secondary | ICD-10-CM | POA: Diagnosis not present

## 2022-09-02 ENCOUNTER — Inpatient Hospital Stay: Payer: Medicare Other | Attending: Oncology

## 2022-09-02 DIAGNOSIS — D472 Monoclonal gammopathy: Secondary | ICD-10-CM | POA: Diagnosis not present

## 2022-09-02 LAB — COMPREHENSIVE METABOLIC PANEL
ALT: 15 U/L (ref 0–44)
AST: 19 U/L (ref 15–41)
Albumin: 3.9 g/dL (ref 3.5–5.0)
Alkaline Phosphatase: 54 U/L (ref 38–126)
Anion gap: 5 (ref 5–15)
BUN: 18 mg/dL (ref 8–23)
CO2: 27 mmol/L (ref 22–32)
Calcium: 8.6 mg/dL — ABNORMAL LOW (ref 8.9–10.3)
Chloride: 106 mmol/L (ref 98–111)
Creatinine, Ser: 0.88 mg/dL (ref 0.44–1.00)
GFR, Estimated: >60 mL/min (ref >60)
Glucose, Bld: 89 mg/dL (ref 70–99)
Potassium: 3.6 mmol/L (ref 3.5–5.1)
Sodium: 138 mmol/L (ref 135–145)
Total Bilirubin: 0.6 mg/dL (ref 0.3–1.2)
Total Protein: 7.4 g/dL (ref 6.5–8.1)

## 2022-09-02 LAB — CBC WITH DIFFERENTIAL/PLATELET
Abs Immature Granulocytes: 0.05 10*3/uL (ref 0.00–0.07)
Basophils Absolute: 0 10*3/uL (ref 0.0–0.1)
Basophils Relative: 1 %
Eosinophils Absolute: 0.1 10*3/uL (ref 0.0–0.5)
Eosinophils Relative: 2 %
HCT: 32 % — ABNORMAL LOW (ref 36.0–46.0)
Hemoglobin: 10.4 g/dL — ABNORMAL LOW (ref 12.0–15.0)
Immature Granulocytes: 1 %
Lymphocytes Relative: 19 %
Lymphs Abs: 1.3 10*3/uL (ref 0.7–4.0)
MCH: 30.1 pg (ref 26.0–34.0)
MCHC: 32.5 g/dL (ref 30.0–36.0)
MCV: 92.5 fL (ref 80.0–100.0)
Monocytes Absolute: 0.4 10*3/uL (ref 0.1–1.0)
Monocytes Relative: 6 %
Neutro Abs: 4.8 10*3/uL (ref 1.7–7.7)
Neutrophils Relative %: 71 %
Platelets: 164 10*3/uL (ref 150–400)
RBC: 3.46 MIL/uL — ABNORMAL LOW (ref 3.87–5.11)
RDW: 21.1 % — ABNORMAL HIGH (ref 11.5–15.5)
WBC: 6.7 10*3/uL (ref 4.0–10.5)
nRBC: 0.3 % — ABNORMAL HIGH (ref 0.0–0.2)

## 2022-09-03 LAB — KAPPA/LAMBDA LIGHT CHAINS
Kappa free light chain: 20.1 mg/L — ABNORMAL HIGH (ref 3.3–19.4)
Kappa, lambda light chain ratio: 1.5 (ref 0.26–1.65)
Lambda free light chains: 13.4 mg/L (ref 5.7–26.3)

## 2022-09-05 ENCOUNTER — Ambulatory Visit: Payer: Medicare Other | Admitting: Plastic Surgery

## 2022-09-08 ENCOUNTER — Ambulatory Visit (INDEPENDENT_AMBULATORY_CARE_PROVIDER_SITE_OTHER): Payer: Medicare Other | Admitting: Internal Medicine

## 2022-09-08 ENCOUNTER — Telehealth: Payer: Self-pay | Admitting: Internal Medicine

## 2022-09-08 ENCOUNTER — Encounter: Payer: Self-pay | Admitting: Internal Medicine

## 2022-09-08 VITALS — BP 124/66 | HR 52 | Temp 97.7°F | Ht 65.0 in | Wt 148.0 lb

## 2022-09-08 DIAGNOSIS — S81801A Unspecified open wound, right lower leg, initial encounter: Secondary | ICD-10-CM | POA: Diagnosis not present

## 2022-09-08 DIAGNOSIS — E1169 Type 2 diabetes mellitus with other specified complication: Secondary | ICD-10-CM

## 2022-09-08 DIAGNOSIS — E1142 Type 2 diabetes mellitus with diabetic polyneuropathy: Secondary | ICD-10-CM

## 2022-09-08 DIAGNOSIS — E785 Hyperlipidemia, unspecified: Secondary | ICD-10-CM | POA: Diagnosis not present

## 2022-09-08 DIAGNOSIS — K225 Diverticulum of esophagus, acquired: Secondary | ICD-10-CM

## 2022-09-08 NOTE — Patient Instructions (Addendum)
Please Use the CBG monitor to evaluate your post prandial sugars (goal is 160 or less 2 hours  after eating )     I will make a referral to  [facility] to review options of treating your esophageal diverticulum    Keep your wound covered!  An open wound is an infected wound...Marland KitchenMarland Kitchen

## 2022-09-08 NOTE — Progress Notes (Unsigned)
Subjective:  Patient ID: Megan Bradley, female    DOB: 1940/08/27  Age: 82 y.o. MRN: [id number]  CC: The primary encounter diagnosis was Type 2 DM with diabetic neuropathy affecting both sides of body. Diagnoses of Hyperlipidemia associated with type 2 diabetes mellitus, Zenkers diverticulum, and Wound of right leg, initial encounter were also pertinent to this visit.   HPI Megan Bradley presents for  Chief Complaint  Patient presents with   Diabetes    Discuss treatment regimen. Pt forgot to bring her glucose log.     1) Type 2 DM   T2DM:  She  feels generally well,  But is not  exercising regularly or trying to lose weight. Checking  blood sugars once daily in a fasting state,  her 21 day average is 111.  Marland Kitchen  Denies any recent hypoglyemic events.  Taking  no hypoglycemic  medications currently . Following a carbohydrate modified diet 6 days per week. Denies numbness, burning and tingling of extremities. Appetite is fair, complicated by esophageal divericulum and nutcracker esophagus    2) Chronic sciatica, left leg.  She has been seeing Dr Kayleen Memos at [facility] for vertebral disk herniation and pinched nerve at L3-4.  Received an ESI several months ago,  which did not help more than a few days,  now taking meloxicam.  Left leg Is so painful from referred pain that she cannot stand. For more than 5 minutes   3) esophageal diverticulum: still Coughing up food particles . Deferred  referral to esophageal specialist  offered by Der Tarri Glenn,  who has now left [facility],  and now patient would like to reconsider.    4) wound on  right leg :  she underwent excision of a skin CA via m Moh's procedure 2 weeks ago, the wound is nickel sized and continues to  bleed.  She has been leaving it uncovered by the nurse's instructions at Dr merritt's office following the procedure.      5)  Prurigo nodularis:  skin condition is being treated with Dupixent every other week injection, taken at home .  She notes that  her condition is improving .    6) anemia slightl worse,  iron levels normal  7) no longer taking any vitamin D Outpatient Medications Prior to Visit  Medication Sig Dispense Refill   ALPRAZolam (XANAX) 0.5 MG tablet Take 1 tablet by mouth twice daily as needed for anxiety 60 tablet 5   DUPIXENT 300 MG/2ML SOPN      ezetimibe (ZETIA) 10 MG tablet Take 1 tablet (10 mg total) by mouth daily. 90 tablet 3   furosemide (LASIX) 20 MG tablet TAKE 1 TABLET BY MOUTH  DAILY AS NEEDED FOR  SWELLING/FLUID RETENTION 90 tablet 3   glucose blood (ACCU-CHEK GUIDE) test strip Use to check blood sugars once daily. Dx Code: E11.9 100 each 3   hydrOXYzine (VISTARIL) 25 MG capsule TAKE 1 CAPSULE BY MOUTH EVERY 8 HOURS AS NEEDED 90 capsule 0   hyoscyamine (LEVSIN SL) 0.125 MG SL tablet DISSOLVE 1 TABLET IN MOUTH EVERY 4 HOURS AS NEEDED 90 tablet 1   isosorbide mononitrate (IMDUR) 30 MG 24 hr tablet Take 1 tablet (30 mg) by mouth once daily in the evening 90 tablet 3   levothyroxine (SYNTHROID) 112 MCG tablet TAKE 1 TABLET BY MOUTH  DAILY BEFORE BREAKFAST 90 tablet 3   meclizine (ANTIVERT) 25 MG tablet TAKE 1 TABLET BY MOUTH THREE TIMES DAILY AS NEEDED FOR VERTIGO 30 tablet  0   meloxicam (MOBIC) 7.5 MG tablet Take 7.5 mg by mouth 2 (two) times daily.     omeprazole (PRILOSEC) 40 MG capsule Take 1 capsule (40 mg total) by mouth 2 (two) times daily. 180 capsule 3   potassium chloride (KLOR-CON) 10 MEQ tablet Take 1 tablet (10 mEq total) by mouth daily as needed (with lasix). 30 tablet 3   rosuvastatin (CRESTOR) 20 MG tablet TAKE 1 TABLET BY MOUTH EVERY  OTHER DAY 45 tablet 3   sertraline (ZOLOFT) 100 MG tablet TAKE 2 TABLETS BY MOUTH DAILY 180 tablet 3   ergocalciferol (DRISDOL) 1.25 MG (50000 UT) capsule Take 1 capsule (50,000 Units total) by mouth once a week. 12 capsule 0   Facility-Administered Medications Prior to Visit  Medication Dose Route Frequency Provider Last Rate Last Admin   clindamycin (CLEOCIN) IVPB  600 mg  600 mg Intravenous Once [place], MD        Review of Systems;  Patient denies headache, fevers, malaise, unintentional weight loss, skin rash, eye pain, sinus congestion and sinus pain, sore throat, dysphagia,  hemoptysis , cough, dyspnea, wheezing, chest pain, palpitations, orthopnea, edema, abdominal pain, nausea, melena, diarrhea, constipation, flank pain, dysuria, hematuria, urinary  Frequency, nocturia, numbness, tingling, seizures,  Focal weakness, Loss of consciousness,  Tremor, insomnia, depression, anxiety, and suicidal ideation.      Objective:  BP 124/66 (BP Location: Left Arm, Patient Position: Sitting, Cuff Size: Normal)   Pulse (!) 52   Temp 97.7 F (36.5 C)   Ht 5\' 5"  (1.651 m)   Wt 148 lb (67.1 kg)   SpO2 91%   BMI 24.63 kg/m   BP Readings from Last 3 Encounters:  09/08/22 124/66  08/06/22 (!) 146/74  06/25/22 105/70    Wt Readings from Last 3 Encounters:  09/08/22 148 lb (67.1 kg)  08/06/22 143 lb 3.2 oz (65 kg)  06/25/22 139 lb (63 kg)    Physical Exam Vitals reviewed.  Constitutional:      General: She is not in acute distress.    Appearance: Normal appearance. She is normal weight. She is not ill-appearing, toxic-appearing or diaphoretic.  HENT:     Head: Normocephalic.  Eyes:     General: No scleral icterus.       Right eye: No discharge.        Left eye: No discharge.     Conjunctiva/sclera: Conjunctivae normal.  Cardiovascular:     Rate and Rhythm: Normal rate and regular rhythm.     Heart sounds: Normal heart sounds.  Pulmonary:     Effort: Pulmonary effort is normal. No respiratory distress.     Breath sounds: Normal breath sounds.  Musculoskeletal:        General: Normal range of motion.  Skin:    General: Skin is warm and dry.     Coloration: Skin is pale.     Findings: Rash and wound present.     Comments: Nickel sized excision wound 2 mm deep on right shin.  No purulent exudate or cellulitis  surrounding skin is  xerotic and crusted with nodules   Neurological:     General: No focal deficit present.     Mental Status: She is alert and oriented to person, place, and time. Mental status is at baseline.  Psychiatric:        Mood and Affect: Mood normal.        Behavior: Behavior normal.        Thought Content: Thought  content normal.        Judgment: Judgment normal.    Lab Results  Component Value Date   HGBA1C 7.4 (H) 08/06/2022   HGBA1C 7.0 (H) 05/05/2022   HGBA1C 7.2 (H) 01/29/2022    Lab Results  Component Value Date   CREATININE 0.88 09/02/2022   CREATININE 0.87 08/06/2022   CREATININE 1.01 (H) 06/25/2022    Lab Results  Component Value Date   WBC 6.7 09/02/2022   HGB 10.4 (L) 09/02/2022   HCT 32.0 (L) 09/02/2022   PLT 164 09/02/2022   GLUCOSE 89 09/02/2022   CHOL 135 08/06/2022   TRIG 51.0 08/06/2022   HDL 74.40 08/06/2022   LDLDIRECT 38.0 08/06/2022   LDLCALC 50 08/06/2022   ALT 15 09/02/2022   AST 19 09/02/2022   NA 138 09/02/2022   K 3.6 09/02/2022   CL 106 09/02/2022   CREATININE 0.88 09/02/2022   BUN 18 09/02/2022   CO2 27 09/02/2022   TSH 1.42 08/06/2022   INR 1.0 09/26/2020   HGBA1C 7.4 (H) 08/06/2022   MICROALBUR 1.5 08/07/2022    CT Head Wo Contrast  Result Date: 06/25/2022 CLINICAL DATA:  Head trauma, minor (Age >= 65y); Neck trauma (Age >= 65y). Syncope. EXAM: CT HEAD WITHOUT CONTRAST CT CERVICAL SPINE WITHOUT CONTRAST TECHNIQUE: Multidetector CT imaging of the head and cervical spine was performed following the standard protocol without intravenous contrast. Multiplanar CT image reconstructions of the cervical spine were also generated. RADIATION DOSE REDUCTION: This exam was performed according to the departmental dose-optimization program which includes automated exposure control, adjustment of the mA and/or kV according to patient size and/or use of iterative reconstruction technique. COMPARISON:  CT head, maxillofacial, and cervical spine 05/08/2020  FINDINGS: CT HEAD FINDINGS Brain: There is no evidence of an acute infarct, intracranial hemorrhage, mass, midline shift, or extra-axial fluid collection. The ventricles and sulci are within normal limits for age. Vascular: Calcified atherosclerosis at the skull base. No hyperdense vessel. Skull: No acute fracture or suspicious osseous lesion. Sinuses/Orbits: Paranasal sinuses and mastoid air cells are clear. Bilateral cataract extraction. Other: None. CT CERVICAL SPINE FINDINGS Alignment: Mild chronic reversal of the normal cervical lordosis. Unchanged trace anterolisthesis of C4 on C5. Skull base and vertebrae: No acute fracture or suspicious osseous lesion. Moderate median C1-2 arthropathy. Soft tissues and spinal canal: No prevertebral fluid or swelling. No visible canal hematoma. Disc levels: Multilevel disc degeneration, moderate at C5-6 and C6-7. Asymmetrically severe facet arthrosis on the right at C2-3 and C4-5 and on the left at C3-4. No evidence of high-grade stenosis. Upper chest: No mass or consolidation in the included lung apices. Other: None. IMPRESSION: 1. No evidence of acute intracranial abnormality. 2. No acute cervical spine fracture. Electronically Signed   By: Logan Bores M.D.   On: 06/25/2022 09:38   CT Cervical Spine Wo Contrast  Result Date: 06/25/2022 CLINICAL DATA:  Head trauma, minor (Age >= 65y); Neck trauma (Age >= 65y). Syncope. EXAM: CT HEAD WITHOUT CONTRAST CT CERVICAL SPINE WITHOUT CONTRAST TECHNIQUE: Multidetector CT imaging of the head and cervical spine was performed following the standard protocol without intravenous contrast. Multiplanar CT image reconstructions of the cervical spine were also generated. RADIATION DOSE REDUCTION: This exam was performed according to the departmental dose-optimization program which includes automated exposure control, adjustment of the mA and/or kV according to patient size and/or use of iterative reconstruction technique. COMPARISON:  CT  head, maxillofacial, and cervical spine 05/08/2020 FINDINGS: CT HEAD FINDINGS Brain: There is no  evidence of an acute infarct, intracranial hemorrhage, mass, midline shift, or extra-axial fluid collection. The ventricles and sulci are within normal limits for age. Vascular: Calcified atherosclerosis at the skull base. No hyperdense vessel. Skull: No acute fracture or suspicious osseous lesion. Sinuses/Orbits: Paranasal sinuses and mastoid air cells are clear. Bilateral cataract extraction. Other: None. CT CERVICAL SPINE FINDINGS Alignment: Mild chronic reversal of the normal cervical lordosis. Unchanged trace anterolisthesis of C4 on C5. Skull base and vertebrae: No acute fracture or suspicious osseous lesion. Moderate median C1-2 arthropathy. Soft tissues and spinal canal: No prevertebral fluid or swelling. No visible canal hematoma. Disc levels: Multilevel disc degeneration, moderate at C5-6 and C6-7. Asymmetrically severe facet arthrosis on the right at C2-3 and C4-5 and on the left at C3-4. No evidence of high-grade stenosis. Upper chest: No mass or consolidation in the included lung apices. Other: None. IMPRESSION: 1. No evidence of acute intracranial abnormality. 2. No acute cervical spine fracture. Electronically Signed   By: Logan Bores M.D.   On: 06/25/2022 09:38    Assessment & Plan:  .Type 2 DM with diabetic neuropathy affecting both sides of body Assessment & Plan: A1c has risen to treatment level but fasting sugars have been in range. Without medication.   Freestyle Libre 3 CBG applied for evaluation of post prandial sugars.  Continue statin.   Lab Results  Component Value Date   HGBA1C 7.4 (H) 08/06/2022   Lab Results  Component Value Date   MICROALBUR 1.5 08/07/2022   MICROALBUR 1.0 07/30/2021     Lab Results  Component Value Date   CHOL 135 08/06/2022   HDL 74.40 08/06/2022   LDLCALC 50 08/06/2022   LDLDIRECT 38.0 08/06/2022   TRIG 51.0 08/06/2022   CHOLHDL 2 08/06/2022       Hyperlipidemia associated with type 2 diabetes mellitus  Zenkers diverticulum Assessment & Plan: She has reconsidered referral to specialist because she is repeatedly coughing up chunks of food hours later .  Referral To [facility] discussed .  Will consult with Colleagues   Wound of right leg, initial encounter Assessment & Plan: Excisional wound 27 weeks old on  distal lower extremity , nickel sized   oozing ,  not covered completely.  Telfa and tegaderm applied rtc 2 weeks       I provided  43 minutes of face-to-face time during this encounter reviewing patient's last visit with me, patient's  most recent visit with  gastroenterology,  dermatology ,  recent surgical and non surgical procedures, previous  labs and imaging studies, counseling on wound management and use of CBG monitor ,  and post visit ordering to diagnostics and therapeutics .   Follow-up: Return in about 2 weeks (around 09/22/2022) for follow up diabetes.   Crecencio Mc, MD

## 2022-09-08 NOTE — Telephone Encounter (Signed)
During check out today, as per provider requested, pt needs to F/U in 2 weeks, however, theres no available opening for me to put pt in. Pt would like a phone call back she really needs her appt.

## 2022-09-08 NOTE — Assessment & Plan Note (Signed)
She has reconsidered referral to specialist because she is repeatedly coughing up chunks of food hours later .  Referral To [facility] discussed .  Will consult with Colleagues

## 2022-09-08 NOTE — Assessment & Plan Note (Signed)
Excisional wound 41 weeks old on  distal lower extremity , nickel sized   oozing ,  not covered completely.  Telfa and tegaderm applied rtc 2 weeks

## 2022-09-09 ENCOUNTER — Other Ambulatory Visit: Payer: Self-pay | Admitting: Internal Medicine

## 2022-09-09 ENCOUNTER — Encounter: Payer: Self-pay | Admitting: Oncology

## 2022-09-09 ENCOUNTER — Ambulatory Visit: Payer: Medicare Other | Admitting: Oncology

## 2022-09-09 ENCOUNTER — Inpatient Hospital Stay: Payer: Medicare Other | Attending: Oncology | Admitting: Oncology

## 2022-09-09 ENCOUNTER — Other Ambulatory Visit: Payer: Medicare Other

## 2022-09-09 VITALS — BP 132/119 | HR 45 | Temp 97.3°F | Resp 18 | Ht 65.0 in | Wt 146.2 lb

## 2022-09-09 DIAGNOSIS — E119 Type 2 diabetes mellitus without complications: Secondary | ICD-10-CM | POA: Diagnosis not present

## 2022-09-09 DIAGNOSIS — D472 Monoclonal gammopathy: Secondary | ICD-10-CM | POA: Diagnosis not present

## 2022-09-09 DIAGNOSIS — K225 Diverticulum of esophagus, acquired: Secondary | ICD-10-CM

## 2022-09-09 DIAGNOSIS — E039 Hypothyroidism, unspecified: Secondary | ICD-10-CM | POA: Insufficient documentation

## 2022-09-09 DIAGNOSIS — K224 Dyskinesia of esophagus: Secondary | ICD-10-CM

## 2022-09-09 LAB — MULTIPLE MYELOMA PANEL, SERUM
Albumin SerPl Elph-Mcnc: 3.6 g/dL (ref 2.9–4.4)
Albumin/Glob SerPl: 1.2 (ref 0.7–1.7)
Alpha 1: 0.3 g/dL (ref 0.0–0.4)
Alpha2 Glob SerPl Elph-Mcnc: 0.6 g/dL (ref 0.4–1.0)
B-Globulin SerPl Elph-Mcnc: 1.8 g/dL — ABNORMAL HIGH (ref 0.7–1.3)
Gamma Glob SerPl Elph-Mcnc: 0.4 g/dL (ref 0.4–1.8)
Globulin, Total: 3.1 g/dL (ref 2.2–3.9)
IgA: 200 mg/dL (ref 64–422)
IgG (Immunoglobin G), Serum: 1405 mg/dL (ref 586–1602)
IgM (Immunoglobulin M), Srm: 53 mg/dL (ref 26–217)
M Protein SerPl Elph-Mcnc: 1.2 g/dL — ABNORMAL HIGH
Total Protein ELP: 6.7 g/dL (ref 6.0–8.5)

## 2022-09-09 NOTE — Assessment & Plan Note (Addendum)
A1c has risen to treatment level but fasting sugars have been in range. Without medication.   Freestyle Libre 3 CBG applied for evaluation of post prandial sugars.  Continue statin.   Lab Results  Component Value Date   HGBA1C 7.4 (H) 08/06/2022   Lab Results  Component Value Date   MICROALBUR 1.5 08/07/2022   MICROALBUR 1.0 07/30/2021     Lab Results  Component Value Date   CHOL 135 08/06/2022   HDL 74.40 08/06/2022   LDLCALC 50 08/06/2022   LDLDIRECT 38.0 08/06/2022   TRIG 51.0 08/06/2022   CHOLHDL 2 08/06/2022

## 2022-09-09 NOTE — Telephone Encounter (Signed)
Spoke with pt and schedule her for a 2 week follow up.

## 2022-09-09 NOTE — Progress Notes (Signed)
No concerns for the provider. 

## 2022-09-09 NOTE — Progress Notes (Signed)
Hematology/Oncology Consult note [facility]  Telephone:([phone] [phone]  Patient Care Team: Crecencio Mc, MD as PCP - General (Internal Medicine) Minna Merritts, MD as PCP - Cardiology (Cardiology)   Name of the patient: Megan Bradley  SH:[id number]  [birth year]   Date of visit: 09/09/22  Diagnosis- IgG kappa MGUS   Chief complaint/ Reason for visit- routine f/u of mgus  Heme/Onc history: Patient is a 82 year old female with past medical history significant for type 2 diabetes hyperlipidemia hypothyroidism who has been referred for biclonal gammopathy and concern for pernicious anemia.  Her recent CBC from 01/07/2021 showed white count of 6.6, H&H of 10.4/32.5 with a platelet count of 172.  B12 level was normal at 396 in July 2022.  Iron studies in July showed an elevated ferritin of 344 with an iron saturation of 17%.  Urine protein electrophoresis showed Bence-Jones protein positive IgG kappa serum protein electrophoresis showed 2 possible abnormal protein bands that may represent monoclonal immunoglobulin light chains.  Intrinsic factor antibody was negative a year ago and positive in August 2022.  Looking back at her prior labs patient's hemoglobin was around 12 until December 2021.  It was 11.4 in April 2022 and then went down to 8.9 in May 2022 when she was admitted for right total knee replacement.  Following that her hemoglobin went up to 11.3 in July 2022   Results of blood work from 01/29/2021 were as follows: CBC showed white count of 8, H&H of 11.2/34.6 with an MCV of 91 and a platelet count of 195.  Myeloma panel showed 0.9 g of IgG kappa M protein.  Serum kappa free light chain mildly elevated at 25 with an normal light chain ratio 1.5.  Haptoglobin normal at 146.  B12 levels normal at 475.  LDH normal at 135.  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 She will be seeing [facility]  soon. [place] ny new aches and pains anywhere. No recent hospitalizations  ECOG PS- 2 Pain scale- 0 Opioid associated constipation- no  Review of systems- Review of Systems  Constitutional:  Negative for chills, fever, malaise/fatigue and weight loss.  HENT:  Negative for congestion, ear discharge and nosebleeds.   Eyes:  Negative for blurred vision.  Respiratory:  Negative for cough, hemoptysis, sputum production, shortness of breath and wheezing.   Cardiovascular:  Negative for chest pain, palpitations, orthopnea and claudication.  Gastrointestinal:  Negative for abdominal pain, blood in stool, constipation, diarrhea, heartburn, melena, nausea and vomiting.       Dysphagia  Genitourinary:  Negative for dysuria, flank pain, frequency, hematuria and urgency.  Musculoskeletal:  Negative for back pain, joint pain and myalgias.  Skin:  Negative for rash.  Neurological:  Negative for dizziness, tingling, focal weakness, seizures, weakness and headaches.  Endo/Heme/Allergies:  Does not bruise/bleed easily.  Psychiatric/Behavioral:  Negative for depression and suicidal ideas. The patient does not have insomnia.       Allergies  Allergen Reactions   Atorvastatin Other (See Comments)    Muscle cramps and weakness   Codeine Nausea And Vomiting   Fentanyl Nausea And Vomiting     Past Medical History:  Diagnosis Date   Anxiety    Anxiety disorder    Aortic atherosclerosis    Arthritis    both knees   Breast disorder in female 10/14/2019   Cervical dysplasia    a. 2010 - high grade squamous intraepithelial lesion  s/p excision.   Chest pain    a. 2006 or 2007 [facility]): reportedly nl cors;  b. 09/2011 ETT: nl.   DOE (dyspnea on exertion)    Family history of adverse reaction to anesthesia    grand daughter was hard to wake up after back surgery   GERD (gastroesophageal reflux disease)    Grade II diastolic dysfunction    Herpes zoster    Hypothyroidism    Mild aortic  regurgitation    Nutcracker esophagus    Orthostatic hypotension    Pneumonia    Pre-diabetes    Ruptured left breast implant, initial encounter 07/05/2018   Sleep apnea    does not use cpap, did not tolerate   Vertigo    Wears dentures    partial lower     Past Surgical History:  Procedure Laterality Date   AUGMENTATION MAMMAPLASTY Bilateral    BREAST EXCISIONAL BIOPSY Left    CARDIAC CATHETERIZATION  2003   Dr.Callwood, R/L heart cath,   COLONOSCOPY WITH PROPOFOL N/A 06/12/2017   Procedure: COLONOSCOPY WITH PROPOFOL;  Surgeon: Lucilla Lame, MD;  Location: [place];  Service: Endoscopy;  Laterality: N/A;   dental work     ESOPHAGEAL MANOMETRY N/A 06/19/2016   Procedure: ESOPHAGEAL MANOMETRY (EM);  Surgeon: Lucilla Lame, MD;  Location: [facility];  Service: Endoscopy;  Laterality: N/A;   ESOPHAGOGASTRODUODENOSCOPY (EGD) WITH PROPOFOL N/A 04/03/2017   Procedure: ESOPHAGOGASTRODUODENOSCOPY (EGD) WITH PROPOFOL;  Surgeon: Lucilla Lame, MD;  Location: [place];  Service: Endoscopy;  Laterality: N/A;  diabetic - diet controlled   THYROIDECTOMY  05/15/15   [facility]   TONSILLECTOMY     TOTAL KNEE ARTHROPLASTY Right 10/25/2020   Procedure: RIGHT TOTAL KNEE ARTHROPLASTY;  Surgeon: Thornton Park, MD;  Location: [facility];  Service: Orthopedics;  Laterality: Right;    Social History   Socioeconomic History   Marital status: Married    Spouse name: [name]   Number of children: 1   Years of education: Not on file   Highest education level: Bachelor's degree (e.g., BA, AB, BS)  Occupational History   Not on file  Tobacco Use   Smoking status: Never   Smokeless tobacco: Never  Vaping Use   Vaping Use: Never used  Substance and Sexual Activity   Alcohol use: Yes    Comment: wine once a week   Drug use: No   Sexual activity: Never  Other Topics Concern   Not on file  Social History Narrative   Lives in [place].  Active but doesn't exercise. Retired from [employer].    Social Determinants of Health   Financial Resource Strain: Low Risk  (10/24/2021)   Overall Financial Resource Strain (CARDIA)    Difficulty of Paying Living Expenses: Not hard at all  Food Insecurity: No Food Insecurity (09/07/2022)   Hunger Vital Sign    Worried About Running Out of Food in the Last Year: Never true    Ran Out of Food in the Last Year: Never true  Transportation Needs: No Transportation Needs (09/07/2022)   PRAPARE - Hydrologist (Medical): No    Lack of Transportation (Non-Medical): No  Physical Activity: Insufficiently Active (09/07/2022)   Exercise Vital Sign    Days of Exercise per Week: 1 day    Minutes of Exercise per Session: 10 min  Stress: Stress Concern Present (09/07/2022)   [place]    Feeling of Stress :  To some extent  Social Connections: Socially Integrated (09/07/2022)   Social Connection and Isolation Panel [NHANES]    Frequency of Communication with Friends and Family: Three times a week    Frequency of Social Gatherings with Friends and Family: Twice a week    Attends Religious Services: More than 4 times per year    Active Member of [employer] or Organizations: Yes    Attends Music therapist: More than 4 times per year    Marital Status: Married  Human resources officer Violence: Not At Risk (10/24/2021)   Humiliation, Afraid, Rape, and Kick questionnaire    Fear of Current or Ex-Partner: No    Emotionally Abused: No    Physically Abused: No    Sexually Abused: No    Family History  Problem Relation Age of Onset   Heart attack Mother    Alcohol abuse Mother    Mental illness Mother        died in her 16's of alzheimer's Dementia   Heart disease Mother 64   Heart attack Father    Heart disease Father 105       AMI - died @ 62.   Colon cancer Paternal Grandmother 64   Esophageal cancer Neg Hx    Stomach cancer Neg Hx    Colon polyps Neg Hx       Current Outpatient Medications:    ALPRAZolam (XANAX) 0.5 MG tablet, Take 1 tablet by mouth twice daily as needed for anxiety, Disp: 60 tablet, Rfl: 5   DUPIXENT 300 MG/2ML SOPN, , Disp: , Rfl:    ezetimibe (ZETIA) 10 MG tablet, Take 1 tablet (10 mg total) by mouth daily., Disp: 90 tablet, Rfl: 3   furosemide (LASIX) 20 MG tablet, TAKE 1 TABLET BY MOUTH  DAILY AS NEEDED FOR  SWELLING/FLUID RETENTION, Disp: 90 tablet, Rfl: 3   glucose blood (ACCU-CHEK GUIDE) test strip, Use to check blood sugars once daily. Dx Code: E11.9, Disp: 100 each, Rfl: 3   hydrOXYzine (VISTARIL) 25 MG capsule, TAKE 1 CAPSULE BY MOUTH EVERY 8 HOURS AS NEEDED, Disp: 90 capsule, Rfl: 0   hyoscyamine (LEVSIN SL) 0.125 MG SL tablet, DISSOLVE 1 TABLET IN MOUTH EVERY 4 HOURS AS NEEDED, Disp: 90 tablet, Rfl: 1   isosorbide mononitrate (IMDUR) 30 MG 24 hr tablet, Take 1 tablet (30 mg) by mouth once daily in the evening, Disp: 90 tablet, Rfl: 3   levothyroxine (SYNTHROID) 112 MCG tablet, TAKE 1 TABLET BY MOUTH  DAILY BEFORE BREAKFAST, Disp: 90 tablet, Rfl: 3   meclizine (ANTIVERT) 25 MG tablet, TAKE 1 TABLET BY MOUTH THREE TIMES DAILY AS NEEDED FOR VERTIGO, Disp: 30 tablet, Rfl: 0   meloxicam (MOBIC) 7.5 MG tablet, Take 7.5 mg by mouth 2 (two) times daily., Disp: , Rfl:    omeprazole (PRILOSEC) 40 MG capsule, Take 1 capsule (40 mg total) by mouth 2 (two) times daily., Disp: 180 capsule, Rfl: 3   potassium chloride (KLOR-CON) 10 MEQ tablet, Take 1 tablet (10 mEq total) by mouth daily as needed (with lasix)., Disp: 30 tablet, Rfl: 3   rosuvastatin (CRESTOR) 20 MG tablet, TAKE 1 TABLET BY MOUTH EVERY  OTHER DAY, Disp: 45 tablet, Rfl: 3   sertraline (ZOLOFT) 100 MG tablet, TAKE 2 TABLETS BY MOUTH DAILY, Disp: 180 tablet, Rfl: 3   SODIUM FLUORIDE, DENTAL RINSE, 0.2 % SOLN, Take by mouth., Disp: , Rfl:  No current facility-administered medications for this visit.  Facility-Administered Medications Ordered in Other Visits:  clindamycin (CLEOCIN) IVPB 600 mg, 600 mg, Intravenous, Once, Thornton Park, MD  Physical exam:  Vitals:   09/09/22 1454  BP: (!) 132/119  Pulse: (!) 45  Resp: 18  Temp: (!) 97.3 F (36.3 C)  TempSrc: Tympanic  SpO2: 100%  Weight: 146 lb 3.2 oz (66.3 kg)  Height: 5\' 5"  (1.651 m)   Physical Exam Cardiovascular:     Rate and Rhythm: Normal rate and regular rhythm.     Heart sounds: Normal heart sounds.  Pulmonary:     Effort: Pulmonary effort is normal.     Breath sounds: Normal breath sounds.  Abdominal:     General: Bowel sounds are normal.     Palpations: Abdomen is soft.  Skin:    General: Skin is warm and dry.  Neurological:     Mental Status: She is alert and oriented to person, place, and time.         Latest Ref Rng & Units 09/02/2022    2:03 PM  CMP  Glucose 70 - 99 mg/dL 89   BUN 8 - 23 mg/dL 18   Creatinine 0.44 - 1.00 mg/dL 0.88   Sodium 135 - 145 mmol/L 138   Potassium 3.5 - 5.1 mmol/L 3.6   Chloride 98 - 111 mmol/L 106   CO2 22 - 32 mmol/L 27   Calcium 8.9 - 10.3 mg/dL 8.6   Total Protein 6.5 - 8.1 g/dL 7.4   Total Bilirubin 0.3 - 1.2 mg/dL 0.6   Alkaline Phos 38 - 126 U/L 54   AST 15 - 41 U/L 19   ALT 0 - 44 U/L 15       Latest Ref Rng & Units 09/02/2022    2:03 PM  CBC  WBC 4.0 - 10.5 K/uL 6.7   Hemoglobin 12.0 - 15.0 g/dL 10.4   Hematocrit 36.0 - 46.0 % 32.0   Platelets 150 - 400 K/uL 164     Assessment and plan- Patient is a 82 y.o. female here for routine f/u of IgG MGUS  Patient's baseline hemoglobin runs between 10.5-11.5 and is close to her baseline.  No evidence of CKD or hypercalcemia.  Total protein is normal.  Myeloma panel shows IgG kappa M protein of 1.2 which is overall stable as compared to 1 g a year ago.  Serum free light chain ratio remains normal.  I will repeat these labs in 6 months in 1 year and see her back in 1 year.  I suspect she has an element of anemia of chronic disease given that her iron studies and B12 were  normal last.   Visit Diagnosis 1. MGUS (monoclonal gammopathy of unknown significance)      Dr. Randa Evens, MD, MPH [facility] [id number] 09/09/2022 3:01 PM

## 2022-09-09 NOTE — Addendum Note (Signed)
Addended by: Luella Cook on: 09/09/2022 04:05 PM   Modules accepted: Orders

## 2022-09-11 DIAGNOSIS — M5416 Radiculopathy, lumbar region: Secondary | ICD-10-CM | POA: Diagnosis not present

## 2022-09-23 ENCOUNTER — Ambulatory Visit: Payer: Medicare Other | Admitting: Internal Medicine

## 2022-09-28 NOTE — Progress Notes (Deleted)
Cardiology Office Note  Date:  09/28/2022   ID:  Megan Bradley, DOB [birth year], MRN [id number]  PCP:  Sherlene Shams, MD   No chief complaint on file.   HPI:  [name] is a 82 year old woman past medical history of  syncope , symptoms improved by holding  lisinopril and HCTZ.   chronic chest pain felt secondary to spasm,  previous cardiac catheterization with no coronary disease into 2007,  " Jackhammer esophagus", spasm  she presents for routine followup of her chest pain, shortness of breath, syncope  Last seen in clinic October 2023 Recent falls  Seen in the emergency room June 25, 2022 Syncope, head strike Post micturition near syncope followed by syncope, witnessed by husband Blood pressure low in the emergency room In the ER given IV fluids    Doing well Reports having chronic issues with food and pills getting stuck in her throat  Diagnosed with Zenker's diverticulum  Has learned how to deal with it  Let swelling stable, better off diltiazem Feels her esophagus is stable, spasm stable on isosorbide "jackhammer esophagus", takes levsin as needed  Episode of syncope 6 weeks ago in bedroom, standing at [place], in the afternoon Happened again in bathroom  She has reported BP low at home, takes imdur in the Am At home, can get BP 100/70 Feels that she does not drink enough  Prior records reviewed Echo 4/22: normal EF 60%  She has not required Lasix  EKG personally reviewed by myself on todays visit normal sinus rhythm with rate 73 beats per minute with right bundle branch block, no other significant ST or T wave changes  Other past medical history reviewed Knee surgery 10/2020 10/25/2020 and underwent an uncomplicated right total knee arthroplasty.  Had delerium HGB 8.7, Has recovered: 11.3   chest pain 2013, normal CT scan chest, normal exercise treadmill test, Normal LV function by echocardiogram   Previous echocardiogram many years ago showed  diastolic dysfunction with normal LV function    PMH:   has a past medical history of Anxiety, Anxiety disorder, Aortic atherosclerosis, Arthritis, Breast disorder in female (10/14/2019), Cervical dysplasia, Chest pain, DOE (dyspnea on exertion), Family history of adverse reaction to anesthesia, GERD (gastroesophageal reflux disease), Grade II diastolic dysfunction, Herpes zoster, Hypothyroidism, Mild aortic regurgitation, Nutcracker esophagus, Orthostatic hypotension, Pneumonia, Pre-diabetes, Ruptured left breast implant, initial encounter (07/05/2018), Sleep apnea, Vertigo, and Wears dentures.  PSH:    Past Surgical History:  Procedure Laterality Date   AUGMENTATION MAMMAPLASTY Bilateral    BREAST EXCISIONAL BIOPSY Left    CARDIAC CATHETERIZATION  2003   Dr.Callwood, R/L heart cath,   COLONOSCOPY WITH PROPOFOL N/A 06/12/2017   Procedure: COLONOSCOPY WITH PROPOFOL;  Surgeon: Midge Minium, MD;  Location: [facility];  Service: Endoscopy;  Laterality: N/A;   dental work     ESOPHAGEAL MANOMETRY N/A 06/19/2016   Procedure: ESOPHAGEAL MANOMETRY (EM);  Surgeon: Midge Minium, MD;  Location: [facility];  Service: Endoscopy;  Laterality: N/A;   ESOPHAGOGASTRODUODENOSCOPY (EGD) WITH PROPOFOL N/A 04/03/2017   Procedure: ESOPHAGOGASTRODUODENOSCOPY (EGD) WITH PROPOFOL;  Surgeon: Midge Minium, MD;  Location: [facility];  Service: Endoscopy;  Laterality: N/A;  diabetic - diet controlled   THYROIDECTOMY  05/15/15   [facility]   TONSILLECTOMY     TOTAL KNEE ARTHROPLASTY Right 10/25/2020   Procedure: RIGHT TOTAL KNEE ARTHROPLASTY;  Surgeon: Juanell Fairly, MD;  Location: [facility];  Service: Orthopedics;  Laterality: Right;    Current Outpatient Medications  Medication Sig Dispense  Refill   ALPRAZolam (XANAX) 0.5 MG tablet Take 1 tablet by mouth twice daily as needed for anxiety 60 tablet 5   DUPIXENT 300 MG/2ML SOPN      ezetimibe (ZETIA) 10 MG tablet Take 1 tablet (10 mg total) by mouth daily.  90 tablet 3   furosemide (LASIX) 20 MG tablet TAKE 1 TABLET BY MOUTH  DAILY AS NEEDED FOR  SWELLING/FLUID RETENTION 90 tablet 3   glucose blood (ACCU-CHEK GUIDE) test strip Use to check blood sugars once daily. Dx Code: E11.9 100 each 3   hydrOXYzine (VISTARIL) 25 MG capsule TAKE 1 CAPSULE BY MOUTH EVERY 8 HOURS AS NEEDED 90 capsule 0   hyoscyamine (LEVSIN SL) 0.125 MG SL tablet DISSOLVE 1 TABLET IN MOUTH EVERY 4 HOURS AS NEEDED 90 tablet 1   isosorbide mononitrate (IMDUR) 30 MG 24 hr tablet Take 1 tablet (30 mg) by mouth once daily in the evening 90 tablet 3   levothyroxine (SYNTHROID) 112 MCG tablet TAKE 1 TABLET BY MOUTH  DAILY BEFORE BREAKFAST 90 tablet 3   meclizine (ANTIVERT) 25 MG tablet TAKE 1 TABLET BY MOUTH THREE TIMES DAILY AS NEEDED FOR VERTIGO 30 tablet 0   meloxicam (MOBIC) 7.5 MG tablet Take 7.5 mg by mouth 2 (two) times daily.     omeprazole (PRILOSEC) 40 MG capsule Take 1 capsule (40 mg total) by mouth 2 (two) times daily. 180 capsule 3   potassium chloride (KLOR-CON) 10 MEQ tablet Take 1 tablet (10 mEq total) by mouth daily as needed (with lasix). 30 tablet 3   rosuvastatin (CRESTOR) 20 MG tablet TAKE 1 TABLET BY MOUTH EVERY  OTHER DAY 45 tablet 3   sertraline (ZOLOFT) 100 MG tablet TAKE 2 TABLETS BY MOUTH DAILY 180 tablet 3   SODIUM FLUORIDE, DENTAL RINSE, 0.2 % SOLN Take by mouth.     No current facility-administered medications for this visit.   Facility-Administered Medications Ordered in Other Visits  Medication Dose Route Frequency Provider Last Rate Last Admin   clindamycin (CLEOCIN) IVPB 600 mg  600 mg Intravenous Once Juanell Fairly, MD         Allergies:   Atorvastatin, Codeine, and Fentanyl   Social History:  The patient  reports that she has never smoked. She has never used smokeless tobacco. She reports current alcohol use. She reports that she does not use drugs.   Family History:   family history includes Alcohol abuse in her mother; Colon cancer (age of  onset: 20) in her paternal grandmother; Heart attack in her father and mother; Heart disease (age of onset: 34) in her father; Heart disease (age of onset: 43) in her mother; Mental illness in her mother.    Review of Systems: Review of Systems  Constitutional: Negative.   HENT: Negative.    Cardiovascular:  Positive for leg swelling.  Gastrointestinal: Negative.   Musculoskeletal: Negative.   Neurological: Negative.   Psychiatric/Behavioral: Negative.    All other systems reviewed and are negative.    PHYSICAL EXAM: VS:  There were no vitals taken for this visit. , BMI There is no height or weight on file to calculate BMI. Constitutional:  oriented to person, place, and time. No distress.  HENT:  Head: Grossly normal Eyes:  no discharge. No scleral icterus.  Neck: No JVD, no carotid bruits  Cardiovascular: Regular rate and rhythm, no murmurs appreciated No significant lower extremity edema Pulmonary/Chest: Clear to auscultation bilaterally, no wheezes or rails Abdominal: Soft.  no distension.  no tenderness.  Musculoskeletal: Normal range of motion Neurological:  normal muscle tone. Coordination normal. No atrophy Skin: Erythematous rash on lower extremities Psychiatric: normal affect, pleasant   Recent Labs: 06/25/2022: Magnesium 2.2 08/06/2022: TSH 1.42 09/02/2022: ALT 15; BUN 18; Creatinine, Ser 0.88; Hemoglobin 10.4; Platelets 164; Potassium 3.6; Sodium 138    Lipid Panel Lab Results  Component Value Date   CHOL 135 08/06/2022   HDL 74.40 08/06/2022   LDLCALC 50 08/06/2022   TRIG 51.0 08/06/2022      Wt Readings from Last 3 Encounters:  09/09/22 146 lb 3.2 oz (66.3 kg)  09/08/22 148 lb (67.1 kg)  08/06/22 143 lb 3.2 oz (65 kg)     ASSESSMENT AND PLAN:  Problem List Items Addressed This Visit   None Pure hypercholesterolemia Tolerating Zetia daily Prefers to take Crestor 20 every other day Cholesterol at goal  Aortic arch atherosclerosis (HCC) Prior  CT scan images no significant coronary calcifications, no calcified plaque noted in the ascending or descending aorta   Atypical chest pain Controlled with Levsin as needed, Imdur 30 daily Rare symptoms   Diabetes mellitus without complication (HCC) A1c 7.2, up from 6 range Followed by primary care  Esophageal spasm Off diltiazem secondary to leg swelling Tolerating Imdur, Levsin as needed  Syncope Etiology unclear Orthostatics negative in the office today, drop from 132 down to 119 systolic with standing.  Reports having salty chicken soup for lunch We have ordered a Zio monitor Consider moving the isosorbide to dinnertime Increase fluids in the morning Echo cardiogram last year with normal EF  Leg edema Symptoms improved by holding calcium channel blocker No significant edema on her visit today   Orthostatic hypotension Episodes of syncope, recommended increased hydration Delay taking isosorbide to later in the day   Total encounter time more than 30 minutes  Greater than 50% was spent in counseling and coordination of care with the patient    Signed, Dossie Arbour, M.D., Ph.D. [facility] [place], [place] [phone]

## 2022-09-29 ENCOUNTER — Ambulatory Visit: Payer: Medicare Other | Admitting: Cardiovascular Disease

## 2022-09-29 DIAGNOSIS — I700 Atherosclerosis of aorta: Secondary | ICD-10-CM

## 2022-09-29 DIAGNOSIS — R55 Syncope and collapse: Secondary | ICD-10-CM

## 2022-09-29 DIAGNOSIS — I10 Essential (primary) hypertension: Secondary | ICD-10-CM

## 2022-09-29 DIAGNOSIS — R0609 Other forms of dyspnea: Secondary | ICD-10-CM

## 2022-09-29 DIAGNOSIS — R600 Localized edema: Secondary | ICD-10-CM

## 2022-09-29 DIAGNOSIS — E1142 Type 2 diabetes mellitus with diabetic polyneuropathy: Secondary | ICD-10-CM

## 2022-09-29 DIAGNOSIS — I5032 Chronic diastolic (congestive) heart failure: Secondary | ICD-10-CM

## 2022-09-29 DIAGNOSIS — E782 Mixed hyperlipidemia: Secondary | ICD-10-CM

## 2022-10-01 ENCOUNTER — Other Ambulatory Visit: Payer: Self-pay | Admitting: Internal Medicine

## 2022-10-03 DIAGNOSIS — M5416 Radiculopathy, lumbar region: Secondary | ICD-10-CM | POA: Diagnosis not present

## 2022-10-14 ENCOUNTER — Telehealth: Payer: Self-pay | Admitting: Internal Medicine

## 2022-10-14 NOTE — Telephone Encounter (Signed)
error 

## 2022-10-14 NOTE — Telephone Encounter (Signed)
Contacted Megan Bradley to schedule their annual wellness visit. Call back at later date: REQ CB 10/16/22  Megan Bradley; Care Guide Ambulatory Clinical Support [facility] Direct Dial: [phone]

## 2022-10-15 DIAGNOSIS — M5416 Radiculopathy, lumbar region: Secondary | ICD-10-CM | POA: Diagnosis not present

## 2022-10-15 DIAGNOSIS — E119 Type 2 diabetes mellitus without complications: Secondary | ICD-10-CM | POA: Diagnosis not present

## 2022-10-17 ENCOUNTER — Encounter: Payer: Self-pay | Admitting: Internal Medicine

## 2022-10-20 NOTE — Progress Notes (Unsigned)
Cardiology Office Note  Date:  10/22/2022   ID:  Megan Bradley, DOB Oct 24, 1940, MRN [id number]  PCP:  Megan Shams, MD   Chief Complaint  Patient presents with   Follow-up    6 month F/U-No new cardiac concerns but patient states she did have another syncopal episode about 6 weeks ago.    HPI:  [name] is a 82 year old woman past medical history of  syncope , symptoms improved by holding  lisinopril and HCTZ.   chronic chest pain felt secondary to spasm,  previous cardiac catheterization with no coronary disease into 2007,  " Jackhammer esophagus", spasm  she presents for routine followup of her chest pain, shortness of breath, syncope  Last seen in clinic October 2023  In emergency room June 25, 2022 for syncope Day before presentation did not have much to drink apart from cups of coffee Next morning went to the bathroom, went back to bed felt lightheaded flushed and by report had syncope witnessed by husband Brief loss of consciousness several seconds to less than a minute Blood pressure low in the emergency room 99/78 TSH 25 Treated with IV fluids  Takes imdur daily Rare episodes of nutcracker esophagus Has script for levsin  Receiving cortisone for back pain  Balance "aweful", no falls  EKG personally reviewed by myself on todays visit Nsr rate 75 bpm rbbb  Prior syncope 6 weeks ago in bedroom, standing at [place], in the afternoon Happened again in bathroom  Prior records reviewed Echo 4/22: normal EF 60%  Other past medical history reviewed Knee surgery 10/2020 10/25/2020 and underwent an uncomplicated right total knee arthroplasty.  Had delerium HGB 8.7, Has recovered: 11.3   chest pain 2013, normal CT scan chest, normal exercise treadmill test, Normal LV function by echocardiogram   Previous echocardiogram many years ago showed diastolic dysfunction with normal LV function    PMH:   has a past medical history of Anxiety, Anxiety disorder, Aortic  atherosclerosis (HCC), Arthritis, Breast disorder in female (10/14/2019), Cervical dysplasia, Chest pain, DOE (dyspnea on exertion), Family history of adverse reaction to anesthesia, GERD (gastroesophageal reflux disease), Grade II diastolic dysfunction, Herpes zoster, Hypothyroidism, Mild aortic regurgitation, Nutcracker esophagus, Orthostatic hypotension, Pneumonia, Pre-diabetes, Ruptured left breast implant, initial encounter (07/05/2018), Sleep apnea, Vertigo, and Wears dentures.  PSH:    Past Surgical History:  Procedure Laterality Date   AUGMENTATION MAMMAPLASTY Bilateral    BREAST EXCISIONAL BIOPSY Left    CARDIAC CATHETERIZATION  2003   Dr.Callwood, R/L heart cath,   COLONOSCOPY WITH PROPOFOL N/A 06/12/2017   Procedure: COLONOSCOPY WITH PROPOFOL;  Surgeon: Midge Minium, MD;  Location: [facility];  Service: Endoscopy;  Laterality: N/A;   dental work     ESOPHAGEAL MANOMETRY N/A 06/19/2016   Procedure: ESOPHAGEAL MANOMETRY (EM);  Surgeon: Midge Minium, MD;  Location: [facility];  Service: Endoscopy;  Laterality: N/A;   ESOPHAGOGASTRODUODENOSCOPY (EGD) WITH PROPOFOL N/A 04/03/2017   Procedure: ESOPHAGOGASTRODUODENOSCOPY (EGD) WITH PROPOFOL;  Surgeon: Midge Minium, MD;  Location: [facility];  Service: Endoscopy;  Laterality: N/A;  diabetic - diet controlled   THYROIDECTOMY  05/15/15   [facility]   TONSILLECTOMY     TOTAL KNEE ARTHROPLASTY Right 10/25/2020   Procedure: RIGHT TOTAL KNEE ARTHROPLASTY;  Surgeon: Juanell Fairly, MD;  Location: [facility];  Service: Orthopedics;  Laterality: Right;    Current Outpatient Medications  Medication Sig Dispense Refill   ALPRAZolam (XANAX) 0.5 MG tablet Take 1 tablet by mouth twice daily as needed for anxiety  60 tablet 2   DUPIXENT 300 MG/2ML SOPN Every other Friday     ezetimibe (ZETIA) 10 MG tablet Take 1 tablet (10 mg total) by mouth daily. 90 tablet 3   glucose blood (ACCU-CHEK GUIDE) test strip Use to check blood sugars once daily. Dx  Code: E11.9 100 each 3   hyoscyamine (LEVBID) 0.375 MG 12 hr tablet Take 1 tablet (0.375 mg total) by mouth every 12 (twelve) hours as needed. 60 tablet 0   isosorbide mononitrate (IMDUR) 30 MG 24 hr tablet Take 1 tablet (30 mg) by mouth once daily in the evening 90 tablet 3   levothyroxine (SYNTHROID) 112 MCG tablet TAKE 1 TABLET BY MOUTH  DAILY BEFORE BREAKFAST 90 tablet 3   meclizine (ANTIVERT) 25 MG tablet TAKE 1 TABLET BY MOUTH THREE TIMES DAILY AS NEEDED FOR VERTIGO 30 tablet 0   meloxicam (MOBIC) 7.5 MG tablet Take 7.5 mg by mouth 2 (two) times daily.     nitroGLYCERIN (NITROSTAT) 0.4 MG SL tablet Place 1 tablet (0.4 mg total) under the tongue once as needed for up to 1 dose for chest pain. Or esophageal spasm 30 tablet 0   omeprazole (PRILOSEC) 40 MG capsule Take 1 capsule (40 mg total) by mouth 2 (two) times daily. 180 capsule 3   rosuvastatin (CRESTOR) 20 MG tablet TAKE 1 TABLET BY MOUTH EVERY  OTHER DAY 45 tablet 3   sertraline (ZOLOFT) 100 MG tablet TAKE 2 TABLETS BY MOUTH DAILY 180 tablet 3   SODIUM FLUORIDE, DENTAL RINSE, 0.2 % SOLN Take by mouth as needed.     No current facility-administered medications for this visit.   Facility-Administered Medications Ordered in Other Visits  Medication Dose Route Frequency Provider Last Rate Last Admin   clindamycin (CLEOCIN) IVPB 600 mg  600 mg Intravenous Once Juanell Fairly, MD         Allergies:   Atorvastatin, Codeine, and Fentanyl   Social History:  The patient  reports that she has never smoked. She has never used smokeless tobacco. She reports current alcohol use. She reports that she does not use drugs.   Family History:   family history includes Alcohol abuse in her mother; Colon cancer (age of onset: 110) in her paternal grandmother; Heart attack in her father and mother; Heart disease (age of onset: 66) in her father; Heart disease (age of onset: 60) in her mother; Mental illness in her mother.    Review of Systems: Review  of Systems  Constitutional: Negative.   HENT: Negative.    Respiratory: Negative.    Cardiovascular: Negative.   Gastrointestinal: Negative.   Musculoskeletal: Negative.   Neurological: Negative.   Psychiatric/Behavioral: Negative.    All other systems reviewed and are negative.   PHYSICAL EXAM: VS:  BP 124/78 (BP Location: Left Arm, Patient Position: Sitting, Cuff Size: Normal)   Pulse 75   Ht 5' 5.5" (1.664 m)   Wt 142 lb (64.4 kg)   SpO2 98%   BMI 23.27 kg/m  , BMI Body mass index is 23.27 kg/m. Constitutional:  oriented to person, place, and time. No distress.  HENT:  Head: Grossly normal Eyes:  no discharge. No scleral icterus.  Neck: No JVD, no carotid bruits  Cardiovascular: Regular rate and rhythm, no murmurs appreciated Pulmonary/Chest: Clear to auscultation bilaterally, no wheezes or rails Abdominal: Soft.  no distension.  no tenderness.  Musculoskeletal: Normal range of motion Neurological:  normal muscle tone. Coordination normal. No atrophy Skin: Skin warm and dry Psychiatric:  normal affect, pleasant  Recent Labs: 06/25/2022: Magnesium 2.2 08/06/2022: TSH 1.42 09/02/2022: ALT 15; BUN 18; Creatinine, Ser 0.88; Hemoglobin 10.4; Platelets 164; Potassium 3.6; Sodium 138    Lipid Panel Lab Results  Component Value Date   CHOL 135 08/06/2022   HDL 74.40 08/06/2022   LDLCALC 50 08/06/2022   TRIG 51.0 08/06/2022    Wt Readings from Last 3 Encounters:  10/22/22 142 lb (64.4 kg)  09/09/22 146 lb 3.2 oz (66.3 kg)  09/08/22 148 lb (67.1 kg)     ASSESSMENT AND PLAN:  Problem List Items Addressed This Visit       Cardiology Problems   Aortic atherosclerosis (HCC) - Primary     Other   Bilateral lower extremity edema   Exertional dyspnea   Other Visit Diagnoses     Chronic diastolic heart failure (HCC)       Syncope and collapse       Mixed hyperlipidemia       Benign essential HTN         Pure hypercholesterolemia Tolerating Zetia daily Prefers  to take Crestor 20 every other day Cholesterol at goal  Aortic arch atherosclerosis (HCC) Prior CT scan images no significant coronary calcifications, no calcified plaque noted in the ascending or descending aorta Cholesterol well-controlled   Atypical chest pain Typically controlled with Levsin as needed, Imdur 30 daily, rare breakthrough , 1 last week   Diabetes mellitus without complication (HCC) Typically relatively well-controlled, weight trending down  Esophageal spasm Off diltiazem secondary to leg swelling Tolerating Imdur, Levsin as needed  Syncope Long history of hypovolemia, orthostasis Does better when she is aggressively hydrated Echo cardiogram  with normal EF Hypotensive in the emergency room January 2024  Leg edema Symptoms improved by holding calcium channel blocker No significant edema on her visit today   Orthostatic hypotension Episodes of syncope, recommended increased hydration Delay taking isosorbide to later in the day Discussed hydration strategies   Total encounter time more than 30 minutes  Greater than 50% was spent in counseling and coordination of care with the patient    Signed, Dossie Arbour, M.D., Ph.D. [facility] [place], [place] [phone]

## 2022-10-21 ENCOUNTER — Encounter: Payer: Self-pay | Admitting: Internal Medicine

## 2022-10-21 DIAGNOSIS — Z85828 Personal history of other malignant neoplasm of skin: Secondary | ICD-10-CM | POA: Diagnosis not present

## 2022-10-21 MED ORDER — HYOSCYAMINE SULFATE ER 0.375 MG PO TB12: 0.3750 mg | ORAL_TABLET | Freq: Two times a day (BID) | ORAL | 0 refills | Status: DC | PRN

## 2022-10-21 MED ORDER — NITROGLYCERIN 0.4 MG SL SUBL: 0.4000 mg | SUBLINGUAL_TABLET | Freq: Once | SUBLINGUAL | 0 refills | Status: DC | PRN

## 2022-10-22 ENCOUNTER — Encounter: Payer: Self-pay | Admitting: Cardiovascular Disease

## 2022-10-22 ENCOUNTER — Ambulatory Visit: Payer: Medicare Other | Attending: Cardiovascular Disease | Admitting: Cardiovascular Disease

## 2022-10-22 VITALS — BP 124/78 | HR 75 | Ht 65.5 in | Wt 142.0 lb

## 2022-10-22 DIAGNOSIS — E782 Mixed hyperlipidemia: Secondary | ICD-10-CM

## 2022-10-22 DIAGNOSIS — I5032 Chronic diastolic (congestive) heart failure: Secondary | ICD-10-CM

## 2022-10-22 DIAGNOSIS — R0609 Other forms of dyspnea: Secondary | ICD-10-CM

## 2022-10-22 DIAGNOSIS — R600 Localized edema: Secondary | ICD-10-CM | POA: Diagnosis not present

## 2022-10-22 DIAGNOSIS — R55 Syncope and collapse: Secondary | ICD-10-CM

## 2022-10-22 DIAGNOSIS — I700 Atherosclerosis of aorta: Secondary | ICD-10-CM

## 2022-10-22 DIAGNOSIS — I10 Essential (primary) hypertension: Secondary | ICD-10-CM

## 2022-10-22 NOTE — Patient Instructions (Signed)
Medication Instructions:  No changes  If you need a refill on your cardiac medications before your next appointment, please call your pharmacy.   Lab work: No new labs needed  Testing/Procedures: No new testing needed  Follow-Up: At [facility], you and your health needs are our priority.  As part of our continuing mission to provide you with exceptional heart care, we have created designated Provider Care Teams.  These Care Teams include your primary Cardiologist (physician) and Advanced Practice Providers (APPs -  Physician Assistants and Nurse Practitioners) who all work together to provide you with the care you need, when you need it.  You will need a follow up appointment in 12 months  Providers on your designated Care Team:   Christopher Berge, NP Ryan Dunn, PA-C Cadence Furth, PA-C  COVID-19 Vaccine Information can be found at: [email or web address] For questions related to vaccine distribution or appointments, please email [email or web address] or call [phone].   

## 2022-10-27 ENCOUNTER — Ambulatory Visit (INDEPENDENT_AMBULATORY_CARE_PROVIDER_SITE_OTHER): Payer: Medicare Other | Admitting: Internal Medicine

## 2022-10-27 ENCOUNTER — Encounter: Payer: Self-pay | Admitting: Internal Medicine

## 2022-10-27 VITALS — BP 126/70 | HR 73 | Temp 98.3°F | Ht 65.5 in | Wt 142.4 lb

## 2022-10-27 DIAGNOSIS — K2289 Other specified disease of esophagus: Secondary | ICD-10-CM | POA: Diagnosis not present

## 2022-10-27 DIAGNOSIS — E1142 Type 2 diabetes mellitus with diabetic polyneuropathy: Secondary | ICD-10-CM

## 2022-10-27 MED ORDER — HYOSCYAMINE SULFATE 0.125 MG PO TBDP: 0.1250 mg | ORAL_TABLET | Freq: Four times a day (QID) | ORAL | 5 refills | Status: DC | PRN

## 2022-10-27 NOTE — Patient Instructions (Addendum)
Use debrox in your left EAR FOR 3 DAYS PRIOR TO AN IRRIGATION  Your fasting sugars are normal.  Stop checking at that time;  start checking 2 hour post prandials  once daily at different times   Use the timed release hyoscyamine once daily  .  The sublingual rapidly disintegrating has been sent as well

## 2022-10-27 NOTE — Progress Notes (Unsigned)
Subjective:  Patient ID: Megan Bradley, female    DOB: 23-Feb-1941  Age: 82 y.o. MRN: [id number]  CC: There were no encounter diagnoses.   HPI Megan Bradley presents for  Chief Complaint  Patient presents with   Medical Management of Chronic Issues   1) LEFT EAR PAIN occurs prior to esophageal spasm described as an ache in  the ear and behind the ear . Cerumen impaction noted  2) tooth extraction x 2  .  Recovered from the blood loss   3) 2nd ESI in spine really helped Dr Lorene Dy at [facility]  4) Zencker's diverticulum:  referral made,   has an appt at [facility] but NOT UNTIL DECEMBER .  Has started regurgitating food /liquid out of her nose   Lab Results  Component Value Date   HGBA1C 7.4 (H) 08/06/2022    Outpatient Medications Prior to Visit  Medication Sig Dispense Refill   ALPRAZolam (XANAX) 0.5 MG tablet Take 1 tablet by mouth twice daily as needed for anxiety 60 tablet 2   DUPIXENT 300 MG/2ML SOPN Every other Friday     ezetimibe (ZETIA) 10 MG tablet Take 1 tablet (10 mg total) by mouth daily. 90 tablet 3   glucose blood (ACCU-CHEK GUIDE) test strip Use to check blood sugars once daily. Dx Code: E11.9 100 each 3   hyoscyamine (LEVBID) 0.375 MG 12 hr tablet Take 1 tablet (0.375 mg total) by mouth every 12 (twelve) hours as needed. 60 tablet 0   isosorbide mononitrate (IMDUR) 30 MG 24 hr tablet Take 1 tablet (30 mg) by mouth once daily in the evening 90 tablet 3   levothyroxine (SYNTHROID) 112 MCG tablet TAKE 1 TABLET BY MOUTH  DAILY BEFORE BREAKFAST 90 tablet 3   meclizine (ANTIVERT) 25 MG tablet TAKE 1 TABLET BY MOUTH THREE TIMES DAILY AS NEEDED FOR VERTIGO 30 tablet 0   meloxicam (MOBIC) 7.5 MG tablet Take 7.5 mg by mouth 2 (two) times daily.     nitroGLYCERIN (NITROSTAT) 0.4 MG SL tablet Place 1 tablet (0.4 mg total) under the tongue once as needed for up to 1 dose for chest pain. Or esophageal spasm 30 tablet 0   omeprazole (PRILOSEC) 40 MG capsule Take 1 capsule (40  mg total) by mouth 2 (two) times daily. 180 capsule 3   rosuvastatin (CRESTOR) 20 MG tablet TAKE 1 TABLET BY MOUTH EVERY  OTHER DAY 45 tablet 3   sertraline (ZOLOFT) 100 MG tablet TAKE 2 TABLETS BY MOUTH DAILY 180 tablet 3   SODIUM FLUORIDE, DENTAL RINSE, 0.2 % SOLN Take by mouth as needed.     Facility-Administered Medications Prior to Visit  Medication Dose Route Frequency Provider Last Rate Last Admin   clindamycin (CLEOCIN) IVPB 600 mg  600 mg Intravenous Once Juanell Fairly, MD        Review of Systems;  Patient denies headache, fevers, malaise, unintentional weight loss, skin rash, eye pain, sinus congestion and sinus pain, sore throat, dysphagia,  hemoptysis , cough, dyspnea, wheezing, chest pain, palpitations, orthopnea, edema, abdominal pain, nausea, melena, diarrhea, constipation, flank pain, dysuria, hematuria, urinary  Frequency, nocturia, numbness, tingling, seizures,  Focal weakness, Loss of consciousness,  Tremor, insomnia, depression, anxiety, and suicidal ideation.      Objective:  BP 126/70   Pulse 73   Temp 98.3 F (36.8 C) (Oral)   Ht 5' 5.5" (1.664 m)   Wt 142 lb 6.4 oz (64.6 kg)   SpO2 95%   BMI  23.34 kg/m   BP Readings from Last 3 Encounters:  10/27/22 126/70  10/22/22 124/78  09/09/22 (!) 132/119    Wt Readings from Last 3 Encounters:  10/27/22 142 lb 6.4 oz (64.6 kg)  10/22/22 142 lb (64.4 kg)  09/09/22 146 lb 3.2 oz (66.3 kg)    Physical Exam  Lab Results  Component Value Date   HGBA1C 7.4 (H) 08/06/2022   HGBA1C 7.0 (H) 05/05/2022   HGBA1C 7.2 (H) 01/29/2022    Lab Results  Component Value Date   CREATININE 0.88 09/02/2022   CREATININE 0.87 08/06/2022   CREATININE 1.01 (H) 06/25/2022    Lab Results  Component Value Date   WBC 6.7 09/02/2022   HGB 10.4 (L) 09/02/2022   HCT 32.0 (L) 09/02/2022   PLT 164 09/02/2022   GLUCOSE 89 09/02/2022   CHOL 135 08/06/2022   TRIG 51.0 08/06/2022   HDL 74.40 08/06/2022   LDLDIRECT 38.0  08/06/2022   LDLCALC 50 08/06/2022   ALT 15 09/02/2022   AST 19 09/02/2022   NA 138 09/02/2022   K 3.6 09/02/2022   CL 106 09/02/2022   CREATININE 0.88 09/02/2022   BUN 18 09/02/2022   CO2 27 09/02/2022   TSH 1.42 08/06/2022   INR 1.0 09/26/2020   HGBA1C 7.4 (H) 08/06/2022   MICROALBUR 1.5 08/07/2022    CT Head Wo Contrast  Result Date: 06/25/2022 CLINICAL DATA:  Head trauma, minor (Age >= 65y); Neck trauma (Age >= 65y). Syncope. EXAM: CT HEAD WITHOUT CONTRAST CT CERVICAL SPINE WITHOUT CONTRAST TECHNIQUE: Multidetector CT imaging of the head and cervical spine was performed following the standard protocol without intravenous contrast. Multiplanar CT image reconstructions of the cervical spine were also generated. RADIATION DOSE REDUCTION: This exam was performed according to the departmental dose-optimization program which includes automated exposure control, adjustment of the mA and/or kV according to patient size and/or use of iterative reconstruction technique. COMPARISON:  CT head, maxillofacial, and cervical spine 05/08/2020 FINDINGS: CT HEAD FINDINGS Brain: There is no evidence of an acute infarct, intracranial hemorrhage, mass, midline shift, or extra-axial fluid collection. The ventricles and sulci are within normal limits for age. Vascular: Calcified atherosclerosis at the skull base. No hyperdense vessel. Skull: No acute fracture or suspicious osseous lesion. Sinuses/Orbits: Paranasal sinuses and mastoid air cells are clear. Bilateral cataract extraction. Other: None. CT CERVICAL SPINE FINDINGS Alignment: Mild chronic reversal of the normal cervical lordosis. Unchanged trace anterolisthesis of C4 on C5. Skull base and vertebrae: No acute fracture or suspicious osseous lesion. Moderate median C1-2 arthropathy. Soft tissues and spinal canal: No prevertebral fluid or swelling. No visible canal hematoma. Disc levels: Multilevel disc degeneration, moderate at C5-6 and C6-7. Asymmetrically  severe facet arthrosis on the right at C2-3 and C4-5 and on the left at C3-4. No evidence of high-grade stenosis. Upper chest: No mass or consolidation in the included lung apices. Other: None. IMPRESSION: 1. No evidence of acute intracranial abnormality. 2. No acute cervical spine fracture. Electronically Signed   By: Sebastian Ache M.D.   On: 06/25/2022 09:38   CT Cervical Spine Wo Contrast  Result Date: 06/25/2022 CLINICAL DATA:  Head trauma, minor (Age >= 65y); Neck trauma (Age >= 65y). Syncope. EXAM: CT HEAD WITHOUT CONTRAST CT CERVICAL SPINE WITHOUT CONTRAST TECHNIQUE: Multidetector CT imaging of the head and cervical spine was performed following the standard protocol without intravenous contrast. Multiplanar CT image reconstructions of the cervical spine were also generated. RADIATION DOSE REDUCTION: This exam was performed according to  the departmental dose-optimization program which includes automated exposure control, adjustment of the mA and/or kV according to patient size and/or use of iterative reconstruction technique. COMPARISON:  CT head, maxillofacial, and cervical spine 05/08/2020 FINDINGS: CT HEAD FINDINGS Brain: There is no evidence of an acute infarct, intracranial hemorrhage, mass, midline shift, or extra-axial fluid collection. The ventricles and sulci are within normal limits for age. Vascular: Calcified atherosclerosis at the skull base. No hyperdense vessel. Skull: No acute fracture or suspicious osseous lesion. Sinuses/Orbits: Paranasal sinuses and mastoid air cells are clear. Bilateral cataract extraction. Other: None. CT CERVICAL SPINE FINDINGS Alignment: Mild chronic reversal of the normal cervical lordosis. Unchanged trace anterolisthesis of C4 on C5. Skull base and vertebrae: No acute fracture or suspicious osseous lesion. Moderate median C1-2 arthropathy. Soft tissues and spinal canal: No prevertebral fluid or swelling. No visible canal hematoma. Disc levels: Multilevel disc  degeneration, moderate at C5-6 and C6-7. Asymmetrically severe facet arthrosis on the right at C2-3 and C4-5 and on the left at C3-4. No evidence of high-grade stenosis. Upper chest: No mass or consolidation in the included lung apices. Other: None. IMPRESSION: 1. No evidence of acute intracranial abnormality. 2. No acute cervical spine fracture. Electronically Signed   By: Sebastian Ache M.D.   On: 06/25/2022 09:38    Assessment & Plan:  .There are no diagnoses linked to this encounter.   I provided 30 minutes of face-to-face time during this encounter reviewing patient's last visit with me, patient's  most recent visit with cardiology,  nephrology,  and neurology,  recent surgical and non surgical procedures, previous  labs and imaging studies, counseling on currently addressed issues,  and post visit ordering to diagnostics and therapeutics .   Follow-up: No follow-ups on file.   Sherlene Shams, MD

## 2022-10-28 NOTE — Assessment & Plan Note (Signed)
A1c has risen to treatment level but fasting sugars have been in range. Without medication.   Freestyle Libre 3 CBG applied for evaluation of post prandial sugars.  Continue statin.   Lab Results  Component Value Date   HGBA1C 7.4 (H) 08/06/2022   Lab Results  Component Value Date   MICROALBUR 1.5 08/07/2022   MICROALBUR 1.0 07/30/2021     Lab Results  Component Value Date   CHOL 135 08/06/2022   HDL 74.40 08/06/2022   LDLCALC 50 08/06/2022   LDLDIRECT 38.0 08/06/2022   TRIG 51.0 08/06/2022   CHOLHDL 2 08/06/2022   A1c has risen to treatment level but fasting sugars have been in range. Without medication.   Freestyle Libre 3 CBG applied for evaluation of post prandial sugars.  Continue statin.   Lab Results  Component Value Date   HGBA1C 7.4 (H) 08/06/2022   Lab Results  Component Value Date   MICROALBUR 1.5 08/07/2022   MICROALBUR 1.0 07/30/2021     Lab Results  Component Value Date   CHOL 135 08/06/2022   HDL 74.40 08/06/2022   LDLCALC 50 08/06/2022   LDLDIRECT 38.0 08/06/2022   TRIG 51.0 08/06/2022   CHOLHDL 2 08/06/2022

## 2022-10-28 NOTE — Assessment & Plan Note (Signed)
Refilling hyosyamine for prn use esophageal spasms

## 2022-10-31 DIAGNOSIS — M5416 Radiculopathy, lumbar region: Secondary | ICD-10-CM | POA: Diagnosis not present

## 2022-11-10 ENCOUNTER — Ambulatory Visit (INDEPENDENT_AMBULATORY_CARE_PROVIDER_SITE_OTHER): Payer: Medicare Other

## 2022-11-10 VITALS — Ht 65.5 in | Wt 142.0 lb

## 2022-11-10 DIAGNOSIS — Z0000 Encounter for general adult medical examination without abnormal findings: Secondary | ICD-10-CM

## 2022-11-10 NOTE — Patient Instructions (Signed)
Megan Bradley , Thank you for taking time to come for your Medicare Wellness Visit. I appreciate your ongoing commitment to your health goals. Please review the following plan we discussed and let me know if I can assist you in the future.   These are the goals we discussed:  Goals      DIET - EAT MORE FRUITS AND VEGETABLES     Maintain Healthy Lifestyle     Stay active Healthy diet        This is a list of the screening recommended for you and due dates:  Health Maintenance  Topic Date Due   DEXA scan (bone density measurement)  03/11/2020   COVID-19 Vaccine (6 - 2023-24 season) 02/07/2022   Flu Shot  01/08/2023   Complete foot exam   01/30/2023   Hemoglobin A1C  02/04/2023   Eye exam for diabetics  03/11/2023   Yearly kidney health urinalysis for diabetes  08/07/2023   Yearly kidney function blood test for diabetes  09/02/2023   Medicare Annual Wellness Visit  11/10/2023   DTaP/Tdap/Td vaccine (3 - Td or Tdap) 06/16/2032   Pneumonia Vaccine  Completed   HPV Vaccine  Aged Out   Colon Cancer Screening  Discontinued   Hepatitis C Screening  Discontinued   Zoster (Shingles) Vaccine  Discontinued    Advanced directives: no  Conditions/risks identified: none  Next appointment: Follow up in one year for your annual wellness visit 11/11/23 @ 10:15 am by phone   Preventive Care 65 Years and Older, Female Preventive care refers to lifestyle choices and visits with your health care provider that can promote health and wellness. What does preventive care include? A yearly physical exam. This is also called an annual well check. Dental exams once or twice a year. Routine eye exams. Ask your health care provider how often you should have your eyes checked. Personal lifestyle choices, including: Daily care of your teeth and gums. Regular physical activity. Eating a healthy diet. Avoiding tobacco and drug use. Limiting alcohol use. Practicing safe sex. Taking low-dose aspirin every  day. Taking vitamin and mineral supplements as recommended by your health care provider. What happens during an annual well check? The services and screenings done by your health care provider during your annual well check will depend on your age, overall health, lifestyle risk factors, and family history of disease. Counseling  Your health care provider may ask you questions about your: Alcohol use. Tobacco use. Drug use. Emotional well-being. Home and relationship well-being. Sexual activity. Eating habits. History of falls. Memory and ability to understand (cognition). Work and work Astronomer. Reproductive health. Screening  You may have the following tests or measurements: Height, weight, and BMI. Blood pressure. Lipid and cholesterol levels. These may be checked every 5 years, or more frequently if you are over 40 years old. Skin check. Lung cancer screening. You may have this screening every year starting at age 47 if you have a 30-pack-year history of smoking and currently smoke or have quit within the past 15 years. Fecal occult blood test (FOBT) of the stool. You may have this test every year starting at age 18. Flexible sigmoidoscopy or colonoscopy. You may have a sigmoidoscopy every 5 years or a colonoscopy every 10 years starting at age 20. Hepatitis C blood test. Hepatitis B blood test. Sexually transmitted disease (STD) testing. Diabetes screening. This is done by checking your blood sugar (glucose) after you have not eaten for a while (fasting). You may have this  done every 1-3 years. Bone density scan. This is done to screen for osteoporosis. You may have this done starting at age 19. Mammogram. This may be done every 1-2 years. Talk to your health care provider about how often you should have regular mammograms. Talk with your health care provider about your test results, treatment options, and if necessary, the need for more tests. Vaccines  Your health care  provider may recommend certain vaccines, such as: Influenza vaccine. This is recommended every year. Tetanus, diphtheria, and acellular pertussis (Tdap, Td) vaccine. You may need a Td booster every 10 years. Zoster vaccine. You may need this after age 80. Pneumococcal 13-valent conjugate (PCV13) vaccine. One dose is recommended after age 31. Pneumococcal polysaccharide (PPSV23) vaccine. One dose is recommended after age 4. Talk to your health care provider about which screenings and vaccines you need and how often you need them. This information is not intended to replace advice given to you by your health care provider. Make sure you discuss any questions you have with your health care provider. Document Released: 06/22/2015 Document Revised: 02/13/2016 Document Reviewed: 03/27/2015 Elsevier Interactive Patient Education  2017 [employer].  Fall Prevention in the Home Falls can cause injuries. They can happen to people of all ages. There are many things you can do to make your home safe and to help prevent falls. What can I do on the outside of my home? Regularly fix the edges of walkways and driveways and fix any cracks. Remove anything that might make you trip as you walk through a door, such as a raised step or threshold. Trim any bushes or trees on the path to your home. Use bright outdoor lighting. Clear any walking paths of anything that might make someone trip, such as rocks or tools. Regularly check to see if handrails are loose or broken. Make sure that both sides of any steps have handrails. Any raised decks and porches should have guardrails on the edges. Have any leaves, snow, or ice cleared regularly. Use sand or salt on walking paths during winter. Clean up any spills in your garage right away. This includes oil or grease spills. What can I do in the bathroom? Use night lights. Install grab bars by the toilet and in the tub and shower. Do not use towel bars as grab  bars. Use non-skid mats or decals in the tub or shower. If you need to sit down in the shower, use a plastic, non-slip stool. Keep the floor dry. Clean up any water that spills on the floor as soon as it happens. Remove soap buildup in the tub or shower regularly. Attach bath mats securely with double-sided non-slip rug tape. Do not have throw rugs and other things on the floor that can make you trip. What can I do in the bedroom? Use night lights. Make sure that you have a light by your bed that is easy to reach. Do not use any sheets or blankets that are too big for your bed. They should not hang down onto the floor. Have a firm chair that has side arms. You can use this for support while you get dressed. Do not have throw rugs and other things on the floor that can make you trip. What can I do in the kitchen? Clean up any spills right away. Avoid walking on wet floors. Keep items that you use a lot in easy-to-reach places. If you need to reach something above you, use a strong step stool that  has a grab bar. Keep electrical cords out of the way. Do not use floor polish or wax that makes floors slippery. If you must use wax, use non-skid floor wax. Do not have throw rugs and other things on the floor that can make you trip. What can I do with my stairs? Do not leave any items on the stairs. Make sure that there are handrails on both sides of the stairs and use them. Fix handrails that are broken or loose. Make sure that handrails are as long as the stairways. Check any carpeting to make sure that it is firmly attached to the stairs. Fix any carpet that is loose or worn. Avoid having throw rugs at the top or bottom of the stairs. If you do have throw rugs, attach them to the floor with carpet tape. Make sure that you have a light switch at the top of the stairs and the bottom of the stairs. If you do not have them, ask someone to add them for you. What else can I do to help prevent  falls? Wear shoes that: Do not have high heels. Have rubber bottoms. Are comfortable and fit you well. Are closed at the toe. Do not wear sandals. If you use a stepladder: Make sure that it is fully opened. Do not climb a closed stepladder. Make sure that both sides of the stepladder are locked into place. Ask someone to hold it for you, if possible. Clearly mark and make sure that you can see: Any grab bars or handrails. First and last steps. Where the edge of each step is. Use tools that help you move around (mobility aids) if they are needed. These include: Canes. Walkers. Scooters. Crutches. Turn on the lights when you go into a dark area. Replace any light bulbs as soon as they burn out. Set up your furniture so you have a clear path. Avoid moving your furniture around. If any of your floors are uneven, fix them. If there are any pets around you, be aware of where they are. Review your medicines with your doctor. Some medicines can make you feel dizzy. This can increase your chance of falling. Ask your doctor what other things that you can do to help prevent falls. This information is not intended to replace advice given to you by your health care provider. Make sure you discuss any questions you have with your health care provider. Document Released: 03/22/2009 Document Revised: 11/01/2015 Document Reviewed: 06/30/2014 Elsevier Interactive Patient Education  2017 [employer].

## 2022-11-10 NOTE — Progress Notes (Addendum)
I connected with  [name] on 11/10/22 by a audio enabled telemedicine application and verified that I am speaking with the correct person using two identifiers.  Patient Location: Home  Provider Location: Office/Clinic  I discussed the limitations of evaluation and management by telemedicine. The patient expressed understanding and agreed to proceed.  Subjective:   Megan Bradley is a 82 y.o. female who presents for Medicare Annual (Subsequent) preventive examination.  Review of Systems     Cardiac Risk Factors include: advanced age (>75men, >21 women);diabetes mellitus;dyslipidemia     Objective:    Today's Vitals   11/10/22 1121  Weight: 142 lb (64.4 kg)  Height: 5' 5.5" (1.664 m)   Body mass index is 23.27 kg/m.     11/10/2022   11:08 AM 06/25/2022    8:11 AM 10/24/2021    9:16 AM 09/03/2021    1:21 PM 03/01/2021    2:01 PM 01/29/2021    1:55 PM 10/25/2020    6:42 AM  Advanced Directives  Does Patient Have a Medical Advance Directive? No No Yes Yes Yes Yes Yes  Type of Surveyor, minerals;Living will Healthcare Power of [place];Living will Healthcare Power of [place];Living will  Healthcare Power of [address];Living will  Does patient want to make changes to medical advance directive?   No - Patient declined  No - Patient declined  No - Patient declined  Copy of Healthcare Power of Attorney in Chart?   No - copy requested    No - copy requested  Would patient like information on creating a medical advance directive? No - Patient declined          Current Medications (verified) Outpatient Encounter Medications as of 11/10/2022  Medication Sig   ALPRAZolam (XANAX) 0.5 MG tablet Take 1 tablet by mouth twice daily as needed for anxiety   DUPIXENT 300 MG/2ML SOPN Every other Friday   ezetimibe (ZETIA) 10 MG tablet Take 1 tablet (10 mg total) by mouth daily.   glucose blood (ACCU-CHEK GUIDE) test strip Use to check blood sugars once daily. Dx  Code: E11.9   hyoscyamine (ANASPAZ) 0.125 MG TBDP disintergrating tablet Place 1 tablet (0.125 mg total) under the tongue every 6 (six) hours as needed.   isosorbide mononitrate (IMDUR) 30 MG 24 hr tablet Take 1 tablet (30 mg) by mouth once daily in the evening   levothyroxine (SYNTHROID) 112 MCG tablet TAKE 1 TABLET BY MOUTH  DAILY BEFORE BREAKFAST   meclizine (ANTIVERT) 25 MG tablet TAKE 1 TABLET BY MOUTH THREE TIMES DAILY AS NEEDED FOR VERTIGO   nitroGLYCERIN (NITROSTAT) 0.4 MG SL tablet Place 1 tablet (0.4 mg total) under the tongue once as needed for up to 1 dose for chest pain. Or esophageal spasm   omeprazole (PRILOSEC) 40 MG capsule Take 1 capsule (40 mg total) by mouth 2 (two) times daily.   rosuvastatin (CRESTOR) 20 MG tablet TAKE 1 TABLET BY MOUTH EVERY  OTHER DAY   sertraline (ZOLOFT) 100 MG tablet TAKE 2 TABLETS BY MOUTH DAILY   SODIUM FLUORIDE, DENTAL RINSE, 0.2 % SOLN Take by mouth as needed.   meloxicam (MOBIC) 7.5 MG tablet Take 7.5 mg by mouth 2 (two) times daily. (Patient not taking: Reported on 11/10/2022)   Facility-Administered Encounter Medications as of 11/10/2022  Medication   clindamycin (CLEOCIN) IVPB 600 mg    Allergies (verified) Atorvastatin, Codeine, and Fentanyl   History: Past Medical History:  Diagnosis Date   Anxiety  Anxiety disorder    Aortic atherosclerosis (HCC)    Arthritis    both knees   Breast disorder in female 10/14/2019   Cervical dysplasia    a. 2010 - high grade squamous intraepithelial lesion s/p excision.   Chest pain    a. 2006 or 2007 [facility]): reportedly nl cors;  b. 09/2011 ETT: nl.   DOE (dyspnea on exertion)    Family history of adverse reaction to anesthesia    grand daughter was hard to wake up after back surgery   GERD (gastroesophageal reflux disease)    Grade II diastolic dysfunction    Herpes zoster    Hypothyroidism    Mild aortic regurgitation    Nutcracker esophagus    Orthostatic hypotension    Pneumonia     Pre-diabetes    Ruptured left breast implant, initial encounter 07/05/2018   Sleep apnea    does not use cpap, did not tolerate   Vertigo    Wears dentures    partial lower   Past Surgical History:  Procedure Laterality Date   AUGMENTATION MAMMAPLASTY Bilateral    BREAST EXCISIONAL BIOPSY Left    CARDIAC CATHETERIZATION  2003   Dr.Callwood, R/L heart cath,   COLONOSCOPY WITH PROPOFOL N/A 06/12/2017   Procedure: COLONOSCOPY WITH PROPOFOL;  Surgeon: Midge Minium, MD;  Location: [facility];  Service: Endoscopy;  Laterality: N/A;   dental work     ESOPHAGEAL MANOMETRY N/A 06/19/2016   Procedure: ESOPHAGEAL MANOMETRY (EM);  Surgeon: Midge Minium, MD;  Location: [facility];  Service: Endoscopy;  Laterality: N/A;   ESOPHAGOGASTRODUODENOSCOPY (EGD) WITH PROPOFOL N/A 04/03/2017   Procedure: ESOPHAGOGASTRODUODENOSCOPY (EGD) WITH PROPOFOL;  Surgeon: Midge Minium, MD;  Location: [facility];  Service: Endoscopy;  Laterality: N/A;  diabetic - diet controlled   THYROIDECTOMY  05/15/15   [facility]   TONSILLECTOMY     TOTAL KNEE ARTHROPLASTY Right 10/25/2020   Procedure: RIGHT TOTAL KNEE ARTHROPLASTY;  Surgeon: Juanell Fairly, MD;  Location: [facility];  Service: Orthopedics;  Laterality: Right;   Family History  Problem Relation Age of Onset   Heart attack Mother    Alcohol abuse Mother    Mental illness Mother        died in her 30's of alzheimer's Dementia   Heart disease Mother 73   Heart attack Father    Heart disease Father 84       AMI - died @ 54.   Colon cancer Paternal Grandmother 81   Esophageal cancer Neg Hx    Stomach cancer Neg Hx    Colon polyps Neg Hx    Social History   Socioeconomic History   Marital status: Married    Spouse name: Jeannett Senior   Number of children: 1   Years of education: Not on file   Highest education level: Bachelor's degree (e.g., BA, AB, BS)  Occupational History   Not on file  Tobacco Use   Smoking status: Never   Smokeless  tobacco: Never  Vaping Use   Vaping Use: Never used  Substance and Sexual Activity   Alcohol use: Yes    Comment: wine once a week   Drug use: No   Sexual activity: Never  Other Topics Concern   Not on file  Social History Narrative   Lives in [place].  Active but doesn't exercise. Retired from [employer].   Social Determinants of Health   Financial Resource Strain: Low Risk  (11/10/2022)   Overall Financial Resource Strain (CARDIA)  Difficulty of Paying Living Expenses: Not hard at all  Food Insecurity: No Food Insecurity (11/10/2022)   Hunger Vital Sign    Worried About Running Out of Food in the Last Year: Never true    Ran Out of Food in the Last Year: Never true  Transportation Needs: No Transportation Needs (11/10/2022)   PRAPARE - Administrator, Civil Service (Medical): No    Lack of Transportation (Non-Medical): No  Physical Activity: Insufficiently Active (11/10/2022)   Exercise Vital Sign    Days of Exercise per Week: 3 days    Minutes of Exercise per Session: 30 min  Stress: No Stress Concern Present (11/10/2022)   [employer] of Occupational Health - Occupational Stress Questionnaire    Feeling of Stress : Only a little  Recent Concern: Stress - Stress Concern Present (09/07/2022)   [employer] of Occupational Health - Occupational Stress Questionnaire    Feeling of Stress : To some extent  Social Connections: Socially Integrated (11/10/2022)   Social Connection and Isolation Panel [NHANES]    Frequency of Communication with Friends and Family: More than three times a week    Frequency of Social Gatherings with Friends and Family: More than three times a week    Attends Religious Services: More than 4 times per year    Active Member of [employer] or Organizations: Yes    Attends Engineer, structural: More than 4 times per year    Marital Status: Married    Tobacco Counseling Counseling given: Not Answered   Clinical Intake:  Pre-visit  preparation completed: Yes  Pain : No/denies pain     Nutritional Risks: None Diabetes: Yes CBG done?: No Did pt. bring in CBG monitor from home?: No  How often do you need to have someone help you when you read instructions, pamphlets, or other written materials from your doctor or pharmacy?: 1 - Never  Diabetic?yes Nutrition Risk Assessment:  Has the patient had any N/V/D within the last 2 months?  No  Does the patient have any non-healing wounds?  No  Has the patient had any unintentional weight loss or weight gain?  No   Diabetes:  Is the patient diabetic?  Yes  If diabetic, was a CBG obtained today?  No  Did the patient bring in their glucometer from home?  No  How often do you monitor your CBG's? Once per day.   Financial Strains and Diabetes Management:  Are you having any financial strains with the device, your supplies or your medication? No .  Does the patient want to be seen by Chronic Care Management for management of their diabetes?  No  Would the patient like to be referred to a Nutritionist or for Diabetic Management?  No   Diabetic Exams:  Diabetic Eye Exam: Completed 03/10/22. Marland Kitchen Pt has been advised about the importance in completing this exam.   Diabetic Foot Exam: Completed 01/29/22. Pt has been advised about the importance in completing this exam.    Interpreter Needed?: No  Information entered by :: Kennedy Bucker, LPN   Activities of Daily Living    11/10/2022   11:10 AM  In your present state of health, do you have any difficulty performing the following activities:  Hearing? 0  Vision? 0  Difficulty concentrating or making decisions? 0  Walking or climbing stairs? 0  Comment just slow  Dressing or bathing? 0  Doing errands, shopping? 0  Preparing Food and eating ? N  Using  the Toilet? N  In the past six months, have you accidently leaked urine? N  Do you have problems with loss of bowel control? N  Managing your Medications? N  Managing  your Finances? N  Housekeeping or managing your Housekeeping? N    Patient Care Team: Sherlene Shams, MD as PCP - General (Internal Medicine) Antonieta Iba, MD as PCP - Cardiology (Cardiology)  Indicate any recent Medical Services you may have received from other than [facility] providers in the past year (date may be approximate).     Assessment:   This is a routine wellness examination for [employer].  Hearing/Vision screen Hearing Screening - Comments:: No aids Vision Screening - Comments:: Wear glasses- Dr.Bulakowski  Dietary issues and exercise activities discussed: Current Exercise Habits: Home exercise routine, Type of exercise: walking, Time (Minutes): 30, Frequency (Times/Week): 3, Weekly Exercise (Minutes/Week): 90, Intensity: Mild   Goals Addressed             This Visit's Progress    DIET - EAT MORE FRUITS AND VEGETABLES         Depression Screen    11/10/2022   11:05 AM 10/27/2022    4:08 PM 08/06/2022    1:42 PM 05/15/2022    8:42 AM 05/15/2022    8:10 AM 05/05/2022    3:27 PM 01/29/2022    1:27 PM  PHQ 2/9 Scores  PHQ - 2 Score 0 0 2 2 2 1 2   PHQ- 9 Score 0  4 5  1 2     Fall Risk    11/10/2022   11:09 AM 10/27/2022    4:08 PM 08/06/2022    1:42 PM 05/15/2022    8:10 AM 05/05/2022    3:27 PM  Fall Risk   Falls in the past year? 1 0 1 0 1  Number falls in past yr: 1 0 1 0 1  Injury with Fall? 1 0 1 0 1  Risk for fall due to : History of fall(s) No Fall Risks History of fall(s) No Fall Risks History of fall(s);Impaired balance/gait  Follow up Falls prevention discussed;Falls evaluation completed Falls evaluation completed Falls evaluation completed Falls evaluation completed Falls evaluation completed    FALL RISK PREVENTION PERTAINING TO THE HOME:  Any stairs in or around the home? Yes  If so, are there any without handrails? No  Home free of loose throw rugs in walkways, pet beds, electrical cords, etc? Yes  Adequate lighting in your home to reduce risk  of falls? Yes   ASSISTIVE DEVICES UTILIZED TO PREVENT FALLS:  Life alert? No  Use of a cane, walker or w/c? Yes - cane occasionally Grab bars in the bathroom? No  Shower chair or bench in shower? Yes  Elevated toilet seat or a handicapped toilet? No    Cognitive Function:    04/18/2015    3:57 PM  MMSE - Mini Mental State Exam  Orientation to time 5  Orientation to Place 5  Registration 3  Attention/ Calculation 5  Recall 3  Language- name 2 objects 2  Language- repeat 1  Language- follow 3 step command 3  Language- read & follow direction 1  Write a sentence 1  Copy design 1  Total score 30        11/10/2022   11:16 AM 08/17/2019    9:56 AM 06/18/2018    3:03 PM 06/17/2017    3:03 PM 05/27/2016   11:43 AM  6CIT Screen  What Year? 0  points 0 points 0 points 0 points 0 points  What month? 0 points 0 points 0 points 0 points 0 points  What time? 0 points 0 points 0 points 0 points 0 points  Count back from 20 0 points  0 points 0 points 0 points  Months in reverse 0 points  0 points 0 points 0 points  Repeat phrase 0 points  0 points 0 points   Total Score 0 points  0 points 0 points     Immunizations Immunization History  Administered Date(s) Administered   DTaP 12/15/2011   Fluad Quad(high Dose 65+) 02/25/2019, 03/04/2021, 03/28/2022   Influenza Split 04/14/2011, 03/02/2012   Influenza, High Dose Seasonal PF 02/18/2016, 03/23/2017, 03/29/2018   Influenza,inj,Quad PF,6+ Mos 03/30/2013, 03/04/2014, 03/02/2015   Influenza-Unspecified 03/28/2015, 03/06/2020, 03/12/2020   PFIZER(Purple Top)SARS-COV-2 Vaccination 06/13/2019, 07/04/2019, 04/02/2020, 12/24/2020   PNEUMOCOCCAL CONJUGATE-20 03/28/2022   Pfizer Covid-19 Vaccine Bivalent Booster 40yrs & up 04/13/2021   Pneumococcal Conjugate-13 06/07/2014   Pneumococcal Polysaccharide-23 06/07/2008   Respiratory Syncytial Virus Vaccine,Recomb Aduvanted(Arexvy) 05/15/2022   Tdap 06/16/2022    TDAP status: Up to  date  Flu Vaccine status: Up to date  Pneumococcal vaccine status: Up to date  Covid-19 vaccine status: Completed vaccines  Qualifies for Shingles Vaccine? Yes   Zostavax completed No   Shingrix Completed?: No.    Education has been provided regarding the importance of this vaccine. Patient has been advised to call insurance company to determine out of pocket expense if they have not yet received this vaccine. Advised may also receive vaccine at local pharmacy or Health Dept. Verbalized acceptance and understanding.  Screening Tests Health Maintenance  Topic Date Due   DEXA SCAN  03/11/2020   COVID-19 Vaccine (6 - 2023-24 season) 02/07/2022   INFLUENZA VACCINE  01/08/2023   FOOT EXAM  01/30/2023   HEMOGLOBIN A1C  02/04/2023   OPHTHALMOLOGY EXAM  03/11/2023   Diabetic kidney evaluation - Urine ACR  08/07/2023   Diabetic kidney evaluation - eGFR measurement  09/02/2023   Medicare Annual Wellness (AWV)  11/10/2023   DTaP/Tdap/Td (3 - Td or Tdap) 06/16/2032   Pneumonia Vaccine 62+ Years old  Completed   HPV VACCINES  Aged Out   Colonoscopy  Discontinued   Hepatitis C Screening  Discontinued   Zoster Vaccines- Shingrix  Discontinued    Health Maintenance  Health Maintenance Due  Topic Date Due   DEXA SCAN  03/11/2020   COVID-19 Vaccine (6 - 2023-24 season) 02/07/2022    Colorectal cancer screening: No longer required.   Mammogram status: No longer required due to age.  Bone Density status: Completed 03/11/18. Results reflect: Bone density results: OSTEOPOROSIS. Repeat every 2 years.- pt states will call for mammogram and BDS  Lung Cancer Screening: (Low Dose CT Chest recommended if Age 20-80 years, 30 pack-year currently smoking OR have quit w/in 15years.) does not qualify.   Additional Screening:  Hepatitis C Screening: does not qualify; Completed 04/25/15  Vision Screening: Recommended annual ophthalmology exams for early detection of glaucoma and other disorders of  the eye. Is the patient up to date with their annual eye exam?  Yes  Who is the provider or what is the name of the office in which the patient attends annual eye exams? Dr.Bulakowski If pt is not established with a provider, would they like to be referred to a provider to establish care? No .   Dental Screening: Recommended annual dental exams for proper oral hygiene  Community Resource Referral /  Chronic Care Management: CRR required this visit?  No   CCM required this visit?  No      Plan:     I have personally reviewed and noted the following in the patient's chart:   Medical and social history Use of alcohol, tobacco or illicit drugs  Current medications and supplements including opioid prescriptions. Patient is not currently taking opioid prescriptions. Functional ability and status Nutritional status Physical activity Advanced directives List of other physicians Hospitalizations, surgeries, and ER visits in previous 12 months Vitals Screenings to include cognitive, depression, and falls Referrals and appointments  In addition, I have reviewed and discussed with patient certain preventive protocols, quality metrics, and best practice recommendations. A written personalized care plan for preventive services as well as general preventive health recommendations were provided to patient.     Hal Hope, LPN   06/14/1094   Nurse Notes: none    I have reviewed the above information and agree with above.   Duncan Dull, MD

## 2022-11-16 ENCOUNTER — Other Ambulatory Visit: Payer: Self-pay | Admitting: Internal Medicine

## 2022-11-28 ENCOUNTER — Other Ambulatory Visit: Payer: Self-pay | Admitting: Family

## 2022-12-03 ENCOUNTER — Encounter: Payer: Self-pay | Admitting: Internal Medicine

## 2022-12-03 DIAGNOSIS — D2272 Melanocytic nevi of left lower limb, including hip: Secondary | ICD-10-CM | POA: Diagnosis not present

## 2022-12-03 DIAGNOSIS — D2262 Melanocytic nevi of left upper limb, including shoulder: Secondary | ICD-10-CM | POA: Diagnosis not present

## 2022-12-03 DIAGNOSIS — D2261 Melanocytic nevi of right upper limb, including shoulder: Secondary | ICD-10-CM | POA: Diagnosis not present

## 2022-12-03 DIAGNOSIS — D225 Melanocytic nevi of trunk: Secondary | ICD-10-CM | POA: Diagnosis not present

## 2022-12-03 DIAGNOSIS — D485 Neoplasm of uncertain behavior of skin: Secondary | ICD-10-CM | POA: Diagnosis not present

## 2022-12-03 DIAGNOSIS — Z85828 Personal history of other malignant neoplasm of skin: Secondary | ICD-10-CM | POA: Diagnosis not present

## 2022-12-03 DIAGNOSIS — L570 Actinic keratosis: Secondary | ICD-10-CM | POA: Diagnosis not present

## 2022-12-03 DIAGNOSIS — L280 Lichen simplex chronicus: Secondary | ICD-10-CM | POA: Diagnosis not present

## 2022-12-03 DIAGNOSIS — D0461 Carcinoma in situ of skin of right upper limb, including shoulder: Secondary | ICD-10-CM | POA: Diagnosis not present

## 2022-12-03 DIAGNOSIS — D0472 Carcinoma in situ of skin of left lower limb, including hip: Secondary | ICD-10-CM | POA: Diagnosis not present

## 2022-12-04 MED ORDER — LEVOTHYROXINE SODIUM 112 MCG PO TABS: 112.0000 ug | ORAL_TABLET | Freq: Every day | ORAL | 3 refills | Status: DC

## 2023-01-07 ENCOUNTER — Encounter (INDEPENDENT_AMBULATORY_CARE_PROVIDER_SITE_OTHER): Payer: Self-pay

## 2023-01-12 DIAGNOSIS — K225 Diverticulum of esophagus, acquired: Secondary | ICD-10-CM | POA: Diagnosis not present

## 2023-01-12 DIAGNOSIS — R1310 Dysphagia, unspecified: Secondary | ICD-10-CM | POA: Diagnosis not present

## 2023-01-14 ENCOUNTER — Other Ambulatory Visit: Payer: Self-pay | Admitting: Internal Medicine

## 2023-01-21 ENCOUNTER — Ambulatory Visit: Payer: Self-pay

## 2023-01-29 ENCOUNTER — Other Ambulatory Visit: Payer: Self-pay | Admitting: Internal Medicine

## 2023-01-29 ENCOUNTER — Other Ambulatory Visit: Payer: Self-pay | Admitting: Medical Genetics

## 2023-01-29 DIAGNOSIS — Z006 Encounter for examination for normal comparison and control in clinical research program: Secondary | ICD-10-CM

## 2023-02-03 DIAGNOSIS — Z800 Family history of malignant neoplasm of digestive organs: Secondary | ICD-10-CM | POA: Diagnosis not present

## 2023-02-03 DIAGNOSIS — K222 Esophageal obstruction: Secondary | ICD-10-CM | POA: Diagnosis not present

## 2023-02-03 DIAGNOSIS — Z79899 Other long term (current) drug therapy: Secondary | ICD-10-CM | POA: Diagnosis not present

## 2023-02-03 DIAGNOSIS — R1110 Vomiting, unspecified: Secondary | ICD-10-CM | POA: Diagnosis not present

## 2023-02-03 DIAGNOSIS — K449 Diaphragmatic hernia without obstruction or gangrene: Secondary | ICD-10-CM | POA: Diagnosis not present

## 2023-02-03 DIAGNOSIS — K225 Diverticulum of esophagus, acquired: Secondary | ICD-10-CM | POA: Diagnosis not present

## 2023-02-03 DIAGNOSIS — K219 Gastro-esophageal reflux disease without esophagitis: Secondary | ICD-10-CM | POA: Diagnosis not present

## 2023-02-03 DIAGNOSIS — E890 Postprocedural hypothyroidism: Secondary | ICD-10-CM | POA: Diagnosis not present

## 2023-02-03 DIAGNOSIS — Z7985 Long-term (current) use of injectable non-insulin antidiabetic drugs: Secondary | ICD-10-CM | POA: Diagnosis not present

## 2023-02-03 DIAGNOSIS — E785 Hyperlipidemia, unspecified: Secondary | ICD-10-CM | POA: Diagnosis not present

## 2023-02-04 ENCOUNTER — Encounter: Payer: Self-pay | Admitting: Internal Medicine

## 2023-02-04 ENCOUNTER — Ambulatory Visit (INDEPENDENT_AMBULATORY_CARE_PROVIDER_SITE_OTHER): Payer: Medicare Other | Admitting: Internal Medicine

## 2023-02-04 ENCOUNTER — Other Ambulatory Visit: Payer: Self-pay | Admitting: Cardiovascular Disease

## 2023-02-04 VITALS — BP 122/60 | HR 64 | Temp 98.3°F | Ht 65.5 in | Wt 142.6 lb

## 2023-02-04 DIAGNOSIS — E1142 Type 2 diabetes mellitus with diabetic polyneuropathy: Secondary | ICD-10-CM

## 2023-02-04 DIAGNOSIS — L281 Prurigo nodularis: Secondary | ICD-10-CM

## 2023-02-04 DIAGNOSIS — K224 Dyskinesia of esophagus: Secondary | ICD-10-CM | POA: Diagnosis not present

## 2023-02-04 DIAGNOSIS — I700 Atherosclerosis of aorta: Secondary | ICD-10-CM

## 2023-02-04 DIAGNOSIS — E785 Hyperlipidemia, unspecified: Secondary | ICD-10-CM

## 2023-02-04 DIAGNOSIS — E1169 Type 2 diabetes mellitus with other specified complication: Secondary | ICD-10-CM | POA: Diagnosis not present

## 2023-02-04 DIAGNOSIS — S81801A Unspecified open wound, right lower leg, initial encounter: Secondary | ICD-10-CM

## 2023-02-04 DIAGNOSIS — K225 Diverticulum of esophagus, acquired: Secondary | ICD-10-CM

## 2023-02-04 DIAGNOSIS — E032 Hypothyroidism due to medicaments and other exogenous substances: Secondary | ICD-10-CM

## 2023-02-04 DIAGNOSIS — E042 Nontoxic multinodular goiter: Secondary | ICD-10-CM | POA: Diagnosis not present

## 2023-02-04 MED ORDER — OMEPRAZOLE 40 MG PO CPDR: 40.0000 mg | DELAYED_RELEASE_CAPSULE | Freq: Two times a day (BID) | ORAL | 3 refills | Status: DC

## 2023-02-04 NOTE — Assessment & Plan Note (Signed)
Another Excisional wound 9 weeks old on  distal  right lower extremity , nickel sized  , scabbed over , not covered completely.  Telfa and tegaderm applied rtc 2 weeks

## 2023-02-04 NOTE — Progress Notes (Signed)
Subjective:  Patient ID: Megan Bradley, female    DOB: 1940-12-24  Age: 82 y.o. MRN: [id number]  CC: The primary encounter diagnosis was Type 2 DM with diabetic neuropathy affecting both sides of body (HCC). Diagnoses of Hyperlipidemia associated with type 2 diabetes mellitus (HCC), Iatrogenic hypothyroidism, Multinodular goiter, Nutcracker esophagus, Prurigo nodularis, Wound of right leg, initial encounter, Zenkers diverticulum, and Aortic atherosclerosis (HCC) were also pertinent to this visit.   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 EGD was yesterday ; she was  dilated., unfortunately  the Zencker's diagnosis not clear.: ("too small to intervene  ).  She was intubated for the procedure prophylactics. Today she had a sandwhich on toasted bread today and reports that she ate it without issues.Marland Kitchen  still avoiding small particle size food (rice, grits , ground beef)     2) she has been having  intermittent pain behind her left ear at the insertion of the SCM muscle.  The pain is  aggravated by certain head positioning  and by turning of the head to the right. She has a history of cervical disk disease  managed with ESI last one in March (Christy at [facility])  3) Type 2 DM: has been indulging in ice cream quite frequently due to her dysphagia issues. The ice cream dose not aggravate her jackhammer esophagus.  Her  .Diabetes has been  det controlled for years but last A1c was 7.4     4) Hypothyroidism taking levothyroxine as directed.    Outpatient Medications Prior to Visit  Medication Sig Dispense Refill    ALPRAZolam (XANAX) 0.5 MG tablet Take 1 tablet by mouth twice daily as needed for anxiety 60 tablet 2   DUPIXENT 300 MG/2ML SOPN Every other Friday     ezetimibe (ZETIA) 10 MG tablet Take 1 tablet (10 mg total) by mouth daily. 90 tablet 3   glucose blood (ACCU-CHEK GUIDE) test strip Use to check blood sugars once daily. Dx Code: E11.9 100 each 3   hyoscyamine (ANASPAZ) 0.125 MG TBDP disintergrating tablet Place 1 tablet (0.125 mg total) under the tongue every 6 (six) hours as needed. 30 tablet 5   meclizine (ANTIVERT) 25 MG tablet TAKE 1 TABLET BY MOUTH THREE TIMES DAILY AS NEEDED FOR VERTIGO 30 tablet 0   nitroGLYCERIN (NITROSTAT) 0.4 MG SL tablet Place 1 tablet (0.4 mg total) under the tongue once as needed for up to 1 dose for chest pain. Or esophageal spasm 30 tablet 0   rosuvastatin (CRESTOR) 20 MG tablet TAKE 1 TABLET BY MOUTH EVERY  OTHER DAY 45 tablet 3   sertraline (ZOLOFT) 100 MG tablet TAKE 2 TABLETS BY MOUTH DAILY 180 tablet 3   SODIUM FLUORIDE, DENTAL RINSE, 0.2 % SOLN Take by mouth as needed.     isosorbide mononitrate (IMDUR) 30 MG 24 hr tablet Take 1 tablet (30 mg) by mouth once daily in the evening 90 tablet 3   levothyroxine (SYNTHROID) 112 MCG tablet Take 1 tablet (112 mcg total) by mouth daily before breakfast. 90 tablet 3   omeprazole (PRILOSEC) 40 MG  capsule Take 1 capsule (40 mg total) by mouth 2 (two) times daily. 180 capsule 3   meloxicam (MOBIC) 7.5 MG tablet Take 7.5 mg by mouth 2 (two) times daily. (Patient not taking: Reported on 02/04/2023)     Facility-Administered Medications Prior to Visit  Medication Dose Route Frequency Provider Last Rate Last Admin   clindamycin (CLEOCIN) IVPB 600 mg  600 mg Intravenous Once Juanell Fairly, MD        Review of Systems;  Patient denies headache, fevers, malaise, unintentional weight loss, skin rash, eye pain, sinus congestion and sinus pain, sore throat, dysphagia,  hemoptysis , cough, dyspnea, wheezing, chest pain,  palpitations, orthopnea, edema, abdominal pain, nausea, melena, diarrhea, constipation, flank pain, dysuria, hematuria, urinary  Frequency, nocturia, numbness, tingling, seizures,  Focal weakness, Loss of consciousness,  Tremor, insomnia, depression, anxiety, and suicidal ideation.      Objective:  BP 122/60   Pulse 64   Temp 98.3 F (36.8 C) (Oral)   Ht 5' 5.5" (1.664 m)   Wt 142 lb 9.6 oz (64.7 kg)   SpO2 98%   BMI 23.37 kg/m   BP Readings from Last 3 Encounters:  02/04/23 122/60  10/27/22 126/70  10/22/22 124/78    Wt Readings from Last 3 Encounters:  02/04/23 142 lb 9.6 oz (64.7 kg)  11/10/22 142 lb (64.4 kg)  10/27/22 142 lb 6.4 oz (64.6 kg)    Physical Exam  Lab Results  Component Value Date   HGBA1C 6.9 (H) 02/04/2023   HGBA1C 7.4 (H) 08/06/2022   HGBA1C 7.0 (H) 05/05/2022    Lab Results  Component Value Date   CREATININE 1.00 02/04/2023   CREATININE 0.88 09/02/2022   CREATININE 0.87 08/06/2022    Lab Results  Component Value Date   WBC 6.7 09/02/2022   HGB 10.4 (L) 09/02/2022   HCT 32.0 (L) 09/02/2022   PLT 164 09/02/2022   GLUCOSE 92 02/04/2023   CHOL 124 02/04/2023   TRIG 101.0 02/04/2023   HDL 61.50 02/04/2023   LDLDIRECT 48.0 02/04/2023   LDLCALC 43 02/04/2023   ALT 13 02/04/2023   AST 16 02/04/2023   NA 141 02/04/2023   K 4.1 02/04/2023   CL 104 02/04/2023   CREATININE 1.00 02/04/2023   BUN 22 02/04/2023   CO2 28 02/04/2023   TSH 0.28 (L) 02/04/2023   INR 1.0 09/26/2020   HGBA1C 6.9 (H) 02/04/2023   MICROALBUR 1.5 08/07/2022    CT Head Wo Contrast  Result Date: 06/25/2022 CLINICAL DATA:  Head trauma, minor (Age >= 65y); Neck trauma (Age >= 65y). Syncope. EXAM: CT HEAD WITHOUT CONTRAST CT CERVICAL SPINE WITHOUT CONTRAST TECHNIQUE: Multidetector CT imaging of the head and cervical spine was performed following the standard protocol without intravenous contrast. Multiplanar CT image reconstructions of the cervical spine were also  generated. RADIATION DOSE REDUCTION: This exam was performed according to the departmental dose-optimization program which includes automated exposure control, adjustment of the mA and/or kV according to patient size and/or use of iterative reconstruction technique. COMPARISON:  CT head, maxillofacial, and cervical spine 05/08/2020 FINDINGS: CT HEAD FINDINGS Brain: There is no evidence of an acute infarct, intracranial hemorrhage, mass, midline shift, or extra-axial fluid collection. The ventricles and sulci are within normal limits for age. Vascular: Calcified atherosclerosis at the skull base. No hyperdense vessel. Skull: No acute fracture or suspicious osseous lesion. Sinuses/Orbits: Paranasal sinuses and mastoid air cells are clear. Bilateral cataract extraction. Other: None. CT CERVICAL SPINE FINDINGS Alignment: Mild chronic reversal of  the normal cervical lordosis. Unchanged trace anterolisthesis of C4 on C5. Skull base and vertebrae: No acute fracture or suspicious osseous lesion. Moderate median C1-2 arthropathy. Soft tissues and spinal canal: No prevertebral fluid or swelling. No visible canal hematoma. Disc levels: Multilevel disc degeneration, moderate at C5-6 and C6-7. Asymmetrically severe facet arthrosis on the right at C2-3 and C4-5 and on the left at C3-4. No evidence of high-grade stenosis. Upper chest: No mass or consolidation in the included lung apices. Other: None. IMPRESSION: 1. No evidence of acute intracranial abnormality. 2. No acute cervical spine fracture. Electronically Signed   By: Sebastian Ache M.D.   On: 06/25/2022 09:38   CT Cervical Spine Wo Contrast  Result Date: 06/25/2022 CLINICAL DATA:  Head trauma, minor (Age >= 65y); Neck trauma (Age >= 65y). Syncope. EXAM: CT HEAD WITHOUT CONTRAST CT CERVICAL SPINE WITHOUT CONTRAST TECHNIQUE: Multidetector CT imaging of the head and cervical spine was performed following the standard protocol without intravenous contrast. Multiplanar CT  image reconstructions of the cervical spine were also generated. RADIATION DOSE REDUCTION: This exam was performed according to the departmental dose-optimization program which includes automated exposure control, adjustment of the mA and/or kV according to patient size and/or use of iterative reconstruction technique. COMPARISON:  CT head, maxillofacial, and cervical spine 05/08/2020 FINDINGS: CT HEAD FINDINGS Brain: There is no evidence of an acute infarct, intracranial hemorrhage, mass, midline shift, or extra-axial fluid collection. The ventricles and sulci are within normal limits for age. Vascular: Calcified atherosclerosis at the skull base. No hyperdense vessel. Skull: No acute fracture or suspicious osseous lesion. Sinuses/Orbits: Paranasal sinuses and mastoid air cells are clear. Bilateral cataract extraction. Other: None. CT CERVICAL SPINE FINDINGS Alignment: Mild chronic reversal of the normal cervical lordosis. Unchanged trace anterolisthesis of C4 on C5. Skull base and vertebrae: No acute fracture or suspicious osseous lesion. Moderate median C1-2 arthropathy. Soft tissues and spinal canal: No prevertebral fluid or swelling. No visible canal hematoma. Disc levels: Multilevel disc degeneration, moderate at C5-6 and C6-7. Asymmetrically severe facet arthrosis on the right at C2-3 and C4-5 and on the left at C3-4. No evidence of high-grade stenosis. Upper chest: No mass or consolidation in the included lung apices. Other: None. IMPRESSION: 1. No evidence of acute intracranial abnormality. 2. No acute cervical spine fracture. Electronically Signed   By: Sebastian Ache M.D.   On: 06/25/2022 09:38    Assessment & Plan:  .Type 2 DM with diabetic neuropathy affecting both sides of body (HCC) Assessment & Plan: A1c has improved from 7.2 to 6.9  without pharmacotherapy.  Dietary advice given. Continue statin.   Lab Results  Component Value Date   HGBA1C 6.9 (H) 02/04/2023   Lab Results  Component  Value Date   MICROALBUR 1.5 08/07/2022   MICROALBUR 1.0 07/30/2021   Lab Results  Component Value Date   CHOL 124 02/04/2023   HDL 61.50 02/04/2023   LDLCALC 43 02/04/2023   LDLDIRECT 48.0 02/04/2023   TRIG 101.0 02/04/2023   CHOLHDL 2 02/04/2023    Orders: -     Hemoglobin A1c -     Comprehensive metabolic panel  Hyperlipidemia associated with type 2 diabetes mellitus (HCC) Assessment & Plan: Managed with daily Zetia and QOD rosuvastatin per patient preference.  LDL is 48 Lab Results  Component Value Date   HGBA1C 6.9 (H) 02/04/2023    Lab Results  Component Value Date   MICROALBUR 1.5 08/07/2022   MICROALBUR 1.0 07/30/2021  Lab Results  Component Value Date   CHOL 124 02/04/2023   HDL 61.50 02/04/2023   LDLCALC 43 02/04/2023   LDLDIRECT 48.0 02/04/2023   TRIG 101.0 02/04/2023   CHOLHDL 2 02/04/2023   Lab Results  Component Value Date   ALT 13 02/04/2023   AST 16 02/04/2023   ALKPHOS 56 02/04/2023   BILITOT 0.8 02/04/2023     Orders: -     Lipid panel -     LDL cholesterol, direct  Iatrogenic hypothyroidism Assessment & Plan: REDUCING DOSE FROM 112 TO 100 MCG OF LEVOTHYROXINE DAILY  N Lab Results  Component Value Date   TSH 0.28 (L) 02/04/2023        Orders: -     TSH  Multinodular goiter Assessment & Plan: S/p thyroidectomy  at [facility] in 2017 due to  extrinsic pressure on trachea . THS is supressed on 112 mcg daily dose of levothyroxine..  will reduce to 100 mcg   Lab Results  Component Value Date   TSH 0.28 (L) 02/04/2023     Orders: -     Levothyroxine Sodium; Take 1 tablet (100 mcg total) by mouth daily before breakfast.  Dispense: 90 tablet; Refill: 1  Nutcracker esophagus Assessment & Plan: Managed with  Imdur  and prn hyoscyamine    Prurigo nodularis Assessment & Plan: Managed with Dupixent. By Dasher  biopsy  was done 6 weeks ago of right LE :  no CA .  Still has not healed    Wound of right leg, initial  encounter Assessment & Plan: Another Excisional wound 54 weeks old on  distal  right lower extremity , nickel sized  , scabbed over , not covered completely.  Telfa and tegaderm applied rtc 2 weeks    Zenkers diverticulum Assessment & Plan: Her recent EGD by [facility] specialist did not confirm the presence of a ZD that could be corrected.    Aortic atherosclerosis (HCC) Assessment & Plan: She is encouraged to Continue  dual  therapy with ezitimibe and twice weekly crestor given documented evidence of moderate  atherosclerosis in the aorta on prior imaging studies .  She is tolerating zetia and crestor.      Other orders -     Omeprazole; Take 1 capsule (40 mg total) by mouth 2 (two) times daily.  Dispense: 180 capsule; Refill: 3     I provided 31 minutes of face-to-face time during this encounter reviewing patient's last visit with me, patient's  most recent visit with gastroenterology ,  recent surgical and non surgical procedures, previous  labs and imaging studies, counseling on currently addressed issues,  and post visit ordering to diagnostics and therapeutics .   Follow-up: Return in about 3 months (around 05/07/2023) for follow up diabetes.   Sherlene Shams, MD

## 2023-02-04 NOTE — Assessment & Plan Note (Signed)
Managed with  Imdur  and prn hyoscyamine

## 2023-02-04 NOTE — Assessment & Plan Note (Signed)
Managed with Dupixent. By Dasher  biopsy  was done 6 weeks ago of right LE :  no CA .  Still has not healed

## 2023-02-04 NOTE — Assessment & Plan Note (Addendum)
S/p thyroidectomy  at [facility] in 2017 due to  extrinsic pressure on trachea . THS is supressed on 112 mcg daily dose of levothyroxine..  will reduce to 100 mcg   Lab Results  Component Value Date   TSH 0.28 (L) 02/04/2023

## 2023-02-04 NOTE — Patient Instructions (Signed)
I'm glad you had a good experience yesterday!  Glad you can eat !  If the A1c has risen we will have you return in a few weeks to discuss medications after you have been able to collect a few blood sugar readings   Try the [employer] and Healthy choice ice cream entrees l ; they are lower in sugars

## 2023-02-05 LAB — COMPREHENSIVE METABOLIC PANEL
ALT: 13 U/L (ref 0–35)
AST: 16 U/L (ref 0–37)
Albumin: 3.8 g/dL (ref 3.5–5.2)
Alkaline Phosphatase: 56 U/L (ref 39–117)
BUN: 22 mg/dL (ref 6–23)
CO2: 28 meq/L (ref 19–32)
Calcium: 9.5 mg/dL (ref 8.4–10.5)
Chloride: 104 meq/L (ref 96–112)
Creatinine, Ser: 1 mg/dL (ref 0.40–1.20)
GFR: 53.19 mL/min — ABNORMAL LOW (ref >60.00)
Glucose, Bld: 92 mg/dL (ref 70–99)
Potassium: 4.1 meq/L (ref 3.5–5.1)
Sodium: 141 meq/L (ref 135–145)
Total Bilirubin: 0.8 mg/dL (ref 0.2–1.2)
Total Protein: 7 g/dL (ref 6.0–8.3)

## 2023-02-05 LAB — LIPID PANEL
Cholesterol: 124 mg/dL (ref 0–200)
HDL: 61.5 mg/dL (ref >39.00)
LDL Cholesterol: 43 mg/dL (ref 0–99)
NonHDL: 62.87
Total CHOL/HDL Ratio: 2
Triglycerides: 101 mg/dL (ref 0.0–149.0)
VLDL: 20.2 mg/dL (ref 0.0–40.0)

## 2023-02-05 LAB — HEMOGLOBIN A1C: Hgb A1c MFr Bld: 6.9 % — ABNORMAL HIGH (ref 4.6–6.5)

## 2023-02-05 LAB — TSH: TSH: 0.28 u[IU]/mL — ABNORMAL LOW (ref 0.35–5.50)

## 2023-02-05 LAB — LDL CHOLESTEROL, DIRECT: Direct LDL: 48 mg/dL

## 2023-02-06 MED ORDER — LEVOTHYROXINE SODIUM 100 MCG PO TABS
100.0000 ug | ORAL_TABLET | Freq: Every day | ORAL | 1 refills | Status: DC
Start: 2023-02-06 — End: 2023-04-13

## 2023-02-06 NOTE — Assessment & Plan Note (Signed)
She is encouraged to Continue  dual  therapy with ezitimibe and twice weekly crestor given documented evidence of moderate  atherosclerosis in the aorta on prior imaging studies .  She is tolerating zetia and crestor.

## 2023-02-06 NOTE — Assessment & Plan Note (Signed)
REDUCING DOSE FROM 112 TO 100 MCG OF LEVOTHYROXINE DAILY  N Lab Results  Component Value Date   TSH 0.28 (L) 02/04/2023

## 2023-02-06 NOTE — Assessment & Plan Note (Signed)
Managed with daily Zetia and QOD rosuvastatin per patient preference.  LDL is 48 Lab Results  Component Value Date   HGBA1C 6.9 (H) 02/04/2023    Lab Results  Component Value Date   MICROALBUR 1.5 08/07/2022   MICROALBUR 1.0 07/30/2021       Lab Results  Component Value Date   CHOL 124 02/04/2023   HDL 61.50 02/04/2023   LDLCALC 43 02/04/2023   LDLDIRECT 48.0 02/04/2023   TRIG 101.0 02/04/2023   CHOLHDL 2 02/04/2023   Lab Results  Component Value Date   ALT 13 02/04/2023   AST 16 02/04/2023   ALKPHOS 56 02/04/2023   BILITOT 0.8 02/04/2023

## 2023-02-06 NOTE — Assessment & Plan Note (Signed)
Her recent EGD by [facility] specialist did not confirm the presence of a ZD that could be corrected.

## 2023-02-06 NOTE — Assessment & Plan Note (Signed)
A1c has improved from 7.2 to 6.9  without pharmacotherapy.  Dietary advice given. Continue statin.   Lab Results  Component Value Date   HGBA1C 6.9 (H) 02/04/2023   Lab Results  Component Value Date   MICROALBUR 1.5 08/07/2022   MICROALBUR 1.0 07/30/2021   Lab Results  Component Value Date   CHOL 124 02/04/2023   HDL 61.50 02/04/2023   LDLCALC 43 02/04/2023   LDLDIRECT 48.0 02/04/2023   TRIG 101.0 02/04/2023   CHOLHDL 2 02/04/2023

## 2023-02-27 ENCOUNTER — Encounter: Payer: Self-pay | Admitting: Internal Medicine

## 2023-03-02 ENCOUNTER — Other Ambulatory Visit: Payer: Self-pay | Admitting: Internal Medicine

## 2023-03-03 ENCOUNTER — Ambulatory Visit (INDEPENDENT_AMBULATORY_CARE_PROVIDER_SITE_OTHER): Payer: Medicare Other | Admitting: Nurse Practitioner

## 2023-03-03 ENCOUNTER — Encounter: Payer: Self-pay | Admitting: Nurse Practitioner

## 2023-03-03 VITALS — BP 114/72 | HR 59 | Temp 98.2°F | Ht 65.5 in | Wt 142.6 lb

## 2023-03-03 DIAGNOSIS — R058 Other specified cough: Secondary | ICD-10-CM

## 2023-03-03 MED ORDER — AMOXICILLIN 500 MG PO TABS: 500.0000 mg | ORAL_TABLET | Freq: Two times a day (BID) | ORAL | 0 refills | Status: DC

## 2023-03-03 MED ORDER — HYDROCOD POLI-CHLORPHE POLI ER 10-8 MG/5ML PO SUER: 5.0000 mL | Freq: Every evening | ORAL | 0 refills | Status: DC | PRN

## 2023-03-03 NOTE — Progress Notes (Signed)
Established Patient Office Visit  Subjective:  Patient ID: Megan Bradley, female    DOB: Jun 11, 1940  Age: 82 y.o. MRN: [id number]  CC:  Chief Complaint  Patient presents with   Cough    HPI  Megan Bradley presents for:  Cough This is a new problem. The problem has been unchanged. The problem occurs constantly. The cough is Productive of sputum. Associated symptoms include nasal congestion and postnasal drip. Pertinent negatives include no chest pain, chills, ear congestion, fever, headaches, sore throat, shortness of breath or wheezing. Nothing aggravates the symptoms. She has tried nothing for the symptoms.  Patient has Zenker diverticuli.  She is followed by GI at [facility]   Past Medical History:  Diagnosis Date   Anxiety    Anxiety disorder    Aortic atherosclerosis (HCC)    Arthritis    both knees   Breast disorder in female 10/14/2019   Cervical dysplasia    a. 2010 - high grade squamous intraepithelial lesion s/p excision.   Chest pain    a. 2006 or 2007 Cath [facility]): reportedly nl cors;  b. 09/2011 ETT: nl.   DOE (dyspnea on exertion)    Family history of adverse reaction to anesthesia    grand daughter was hard to wake up after back surgery   GERD (gastroesophageal reflux disease)    Grade II diastolic dysfunction    Herpes zoster    Hypothyroidism    Mild aortic regurgitation    Nutcracker esophagus    Orthostatic hypotension    Pneumonia    Pre-diabetes    Ruptured left breast implant, initial encounter 07/05/2018   Sleep apnea    does not use cpap, did not tolerate   Vertigo    Wears dentures    partial lower    Past Surgical History:  Procedure Laterality Date   AUGMENTATION MAMMAPLASTY Bilateral    BREAST EXCISIONAL BIOPSY Left    CARDIAC CATHETERIZATION  2003   Dr.Callwood, R/L heart cath,   COLONOSCOPY WITH PROPOFOL N/A 06/12/2017   Procedure: COLONOSCOPY WITH PROPOFOL;  Surgeon: Midge Minium, MD;  Location: [facility];  Service: Endoscopy;   Laterality: N/A;   dental work     ESOPHAGEAL MANOMETRY N/A 06/19/2016   Procedure: ESOPHAGEAL MANOMETRY (EM);  Surgeon: Midge Minium, MD;  Location: [facility];  Service: Endoscopy;  Laterality: N/A;   ESOPHAGOGASTRODUODENOSCOPY (EGD) WITH PROPOFOL N/A 04/03/2017   Procedure: ESOPHAGOGASTRODUODENOSCOPY (EGD) WITH PROPOFOL;  Surgeon: Midge Minium, MD;  Location: [facility];  Service: Endoscopy;  Laterality: N/A;  diabetic - diet controlled   THYROIDECTOMY  05/15/15   [facility]   TONSILLECTOMY     TOTAL KNEE ARTHROPLASTY Right 10/25/2020   Procedure: RIGHT TOTAL KNEE ARTHROPLASTY;  Surgeon: Juanell Fairly, MD;  Location: [facility] OR[facility];  Service: Orthopedics;  Laterality: Right;    Family History  Problem Relation Age of Onset   Heart attack Mother    Alcohol abuse Mother    Mental illness Mother        died in her 5's of alzheimer's Dementia   Heart disease Mother 9   Heart attack Father    Heart disease Father 38       AMI - died @ 63.   Colon cancer Paternal Grandmother 56   Esophageal cancer Neg Hx    Stomach cancer Neg Hx    Colon polyps Neg Hx     Social History   Socioeconomic History   Marital status: Married    Spouse  name: Jeannett Senior   Number of children: 1   Years of education: Not on file   Highest education level: Bachelor's degree (e.g., BA, AB, BS)  Occupational History   Not on file  Tobacco Use   Smoking status: Never   Smokeless tobacco: Never  Vaping Use   Vaping status: Never Used  Substance and Sexual Activity   Alcohol use: Yes    Comment: wine once a week   Drug use: No   Sexual activity: Never  Other Topics Concern   Not on file  Social History Narrative   Lives in [place].  Active but doesn't exercise. Retired from [employer].   Social Determinants of Health   Financial Resource Strain: Low Risk  (11/10/2022)   Overall Financial Resource Strain (CARDIA)    Difficulty of Paying Living Expenses: Not hard at all  Food Insecurity: No Food  Insecurity (11/10/2022)   Hunger Vital Sign    Worried About Running Out of Food in the Last Year: Never true    Ran Out of Food in the Last Year: Never true  Transportation Needs: No Transportation Needs (11/10/2022)   PRAPARE - Administrator, Civil Service (Medical): No    Lack of Transportation (Non-Medical): No  Physical Activity: Insufficiently Active (11/10/2022)   Exercise Vital Sign    Days of Exercise per Week: 3 days    Minutes of Exercise per Session: 30 min  Stress: No Stress Concern Present (11/10/2022)   [employer] of Occupational Health - Occupational Stress Questionnaire    Feeling of Stress : Only a little  Recent Concern: Stress - Stress Concern Present (09/07/2022)   [employer] of Occupational Health - Occupational Stress Questionnaire    Feeling of Stress : To some extent  Social Connections: Socially Integrated (11/10/2022)   Social Connection and Isolation Panel [NHANES]    Frequency of Communication with Friends and Family: More than three times a week    Frequency of Social Gatherings with Friends and Family: More than three times a week    Attends Religious Services: More than 4 times per year    Active Member of [employer] or Organizations: Yes    Attends Engineer, structural: More than 4 times per year    Marital Status: Married  Catering manager Violence: Not At Risk (11/10/2022)   Humiliation, Afraid, Rape, and Kick questionnaire    Fear of Current or Ex-Partner: No    Emotionally Abused: No    Physically Abused: No    Sexually Abused: No     Outpatient Medications Prior to Visit  Medication Sig Dispense Refill   ALPRAZolam (XANAX) 0.5 MG tablet Take 1 tablet by mouth twice daily as needed for anxiety 60 tablet 2   DUPIXENT 300 MG/2ML SOPN Every other Friday     ezetimibe (ZETIA) 10 MG tablet Take 1 tablet (10 mg total) by mouth daily. 90 tablet 3   glucose blood (ACCU-CHEK GUIDE) test strip Use to check blood sugars once daily. Dx  Code: E11.9 100 each 3   hyoscyamine (ANASPAZ) 0.125 MG TBDP disintergrating tablet Place 1 tablet (0.125 mg total) under the tongue every 6 (six) hours as needed. 30 tablet 5   isosorbide mononitrate (IMDUR) 30 MG 24 hr tablet TAKE 1 TABLET BY MOUTH DAILY 90 tablet 3   levothyroxine (SYNTHROID) 100 MCG tablet Take 1 tablet (100 mcg total) by mouth daily before breakfast. 90 tablet 1   meclizine (ANTIVERT) 25 MG tablet TAKE 1 TABLET  BY MOUTH THREE TIMES DAILY AS NEEDED FOR VERTIGO 30 tablet 0   meloxicam (MOBIC) 7.5 MG tablet Take 7.5 mg by mouth 2 (two) times daily.     nitroGLYCERIN (NITROSTAT) 0.4 MG SL tablet Place 1 tablet (0.4 mg total) under the tongue once as needed for up to 1 dose for chest pain. Or esophageal spasm 30 tablet 0   omeprazole (PRILOSEC) 40 MG capsule Take 1 capsule (40 mg total) by mouth 2 (two) times daily. 180 capsule 3   rosuvastatin (CRESTOR) 20 MG tablet TAKE 1 TABLET BY MOUTH EVERY  OTHER DAY 45 tablet 3   sertraline (ZOLOFT) 100 MG tablet TAKE 2 TABLETS BY MOUTH DAILY 180 tablet 3   SODIUM FLUORIDE, DENTAL RINSE, 0.2 % SOLN Take by mouth as needed.     Facility-Administered Medications Prior to Visit  Medication Dose Route Frequency Provider Last Rate Last Admin   clindamycin (CLEOCIN) IVPB 600 mg  600 mg Intravenous Once Juanell Fairly, MD        Allergies  Allergen Reactions   Atorvastatin Other (See Comments)    Muscle cramps and weakness   Codeine Nausea And Vomiting   Fentanyl Nausea And Vomiting    ROS Review of Systems  Constitutional:  Negative for chills and fever.  HENT:  Positive for postnasal drip. Negative for sore throat.   Respiratory:  Positive for cough. Negative for shortness of breath and wheezing.   Cardiovascular:  Negative for chest pain.  Neurological:  Negative for headaches.   Negative unless indicated in HPI.    Objective:    Physical Exam Constitutional:      Appearance: Normal appearance.  HENT:     Right Ear:  Tympanic membrane normal. Tympanic membrane is not erythematous.     Left Ear: Tympanic membrane normal. Tympanic membrane is not erythematous.     Nose:     Right Turbinates: Not enlarged.     Left Turbinates: Not enlarged.     Right Sinus: No maxillary sinus tenderness or frontal sinus tenderness.     Left Sinus: No maxillary sinus tenderness or frontal sinus tenderness.     Mouth/Throat:     Mouth: Mucous membranes are moist.     Pharynx: No pharyngeal swelling, oropharyngeal exudate or posterior oropharyngeal erythema.     Tonsils: No tonsillar exudate.  Cardiovascular:     Rate and Rhythm: Normal rate and regular rhythm.  Pulmonary:     Effort: Pulmonary effort is normal.     Breath sounds: Normal breath sounds. No stridor. No wheezing.  Neurological:     General: No focal deficit present.     Mental Status: She is alert and oriented to person, place, and time. Mental status is at baseline.  Psychiatric:        Mood and Affect: Mood normal.        Behavior: Behavior normal.        Thought Content: Thought content normal.        Judgment: Judgment normal.     BP 114/72   Pulse (!) 59   Temp 98.2 F (36.8 C)   Ht 5' 5.5" (1.664 m)   Wt 142 lb 9.6 oz (64.7 kg)   SpO2 97%   BMI 23.37 kg/m  Wt Readings from Last 3 Encounters:  03/03/23 142 lb 9.6 oz (64.7 kg)  02/04/23 142 lb 9.6 oz (64.7 kg)  11/10/22 142 lb (64.4 kg)     Health Maintenance  Topic Date Due  DEXA SCAN  03/11/2020   COVID-19 Vaccine (6 - 2023-24 season) 03/19/2023 (Originally 02/08/2023)   INFLUENZA VACCINE  09/07/2023 (Originally 01/08/2023)   OPHTHALMOLOGY EXAM  03/11/2023   Diabetic kidney evaluation - Urine ACR  08/07/2023   HEMOGLOBIN A1C  08/07/2023   Medicare Annual Wellness (AWV)  11/10/2023   Diabetic kidney evaluation - eGFR measurement  02/04/2024   FOOT EXAM  02/04/2024   DTaP/Tdap/Td (3 - Td or Tdap) 06/16/2032   Pneumonia Vaccine 4+ Years old  Completed   HPV VACCINES  Aged Out    Colonoscopy  Discontinued   Hepatitis C Screening  Discontinued   Zoster Vaccines- Shingrix  Discontinued    There are no preventive care reminders to display for this patient.  Lab Results  Component Value Date   TSH 0.28 (L) 02/04/2023   Lab Results  Component Value Date   WBC 6.7 09/02/2022   HGB 10.4 (L) 09/02/2022   HCT 32.0 (L) 09/02/2022   MCV 92.5 09/02/2022   PLT 164 09/02/2022   Lab Results  Component Value Date   NA 141 02/04/2023   K 4.1 02/04/2023   CO2 28 02/04/2023   GLUCOSE 92 02/04/2023   BUN 22 02/04/2023   CREATININE 1.00 02/04/2023   BILITOT 0.8 02/04/2023   ALKPHOS 56 02/04/2023   AST 16 02/04/2023   ALT 13 02/04/2023   PROT 7.0 02/04/2023   ALBUMIN 3.8 02/04/2023   CALCIUM 9.5 02/04/2023   ANIONGAP 5 09/02/2022   GFR 53.19 (L) 02/04/2023   Lab Results  Component Value Date   CHOL 124 02/04/2023   Lab Results  Component Value Date   HDL 61.50 02/04/2023   Lab Results  Component Value Date   LDLCALC 43 02/04/2023   Lab Results  Component Value Date   TRIG 101.0 02/04/2023   Lab Results  Component Value Date   CHOLHDL 2 02/04/2023   Lab Results  Component Value Date   HGBA1C 6.9 (H) 02/04/2023      Assessment & Plan:  Other cough Assessment & Plan: Given patient symptoms will treat with amoxicillin and Tussionex. Patient states she still have refill for [facility]. Symptomatic treatment discussed   Other orders -     Amoxicillin; Take 1 tablet (500 mg total) by mouth 2 (two) times daily.  Dispense: 14 tablet; Refill: 0 -     Hydrocod Poli-Chlorphe Poli ER; Take 5 mLs by mouth at bedtime as needed.  Dispense: 70 mL; Refill: 0    Follow-up: No follow-ups on file.   Kara Dies, NP

## 2023-03-05 ENCOUNTER — Encounter: Payer: Self-pay | Admitting: Nurse Practitioner

## 2023-03-05 ENCOUNTER — Other Ambulatory Visit: Payer: Self-pay | Admitting: Internal Medicine

## 2023-03-09 NOTE — Assessment & Plan Note (Signed)
Given patient symptoms will treat with amoxicillin and Tussionex. Patient states she still have refill for [facility]. Symptomatic treatment discussed

## 2023-03-11 ENCOUNTER — Inpatient Hospital Stay: Payer: Medicare Other | Attending: Oncology

## 2023-03-11 DIAGNOSIS — E785 Hyperlipidemia, unspecified: Secondary | ICD-10-CM | POA: Insufficient documentation

## 2023-03-11 DIAGNOSIS — D649 Anemia, unspecified: Secondary | ICD-10-CM | POA: Insufficient documentation

## 2023-03-11 DIAGNOSIS — D472 Monoclonal gammopathy: Secondary | ICD-10-CM | POA: Diagnosis not present

## 2023-03-11 DIAGNOSIS — E039 Hypothyroidism, unspecified: Secondary | ICD-10-CM | POA: Diagnosis not present

## 2023-03-11 DIAGNOSIS — N189 Chronic kidney disease, unspecified: Secondary | ICD-10-CM | POA: Diagnosis not present

## 2023-03-11 DIAGNOSIS — I700 Atherosclerosis of aorta: Secondary | ICD-10-CM | POA: Diagnosis not present

## 2023-03-11 DIAGNOSIS — E559 Vitamin D deficiency, unspecified: Secondary | ICD-10-CM | POA: Diagnosis not present

## 2023-03-11 DIAGNOSIS — E538 Deficiency of other specified B group vitamins: Secondary | ICD-10-CM | POA: Diagnosis not present

## 2023-03-11 DIAGNOSIS — E119 Type 2 diabetes mellitus without complications: Secondary | ICD-10-CM | POA: Insufficient documentation

## 2023-03-11 LAB — COMPREHENSIVE METABOLIC PANEL
ALT: 16 U/L (ref 0–44)
AST: 17 U/L (ref 15–41)
Albumin: 3.7 g/dL (ref 3.5–5.0)
Alkaline Phosphatase: 53 U/L (ref 38–126)
Anion gap: 9 (ref 5–15)
BUN: 17 mg/dL (ref 8–23)
CO2: 27 mmol/L (ref 22–32)
Calcium: 9.1 mg/dL (ref 8.9–10.3)
Chloride: 103 mmol/L (ref 98–111)
Creatinine, Ser: 0.93 mg/dL (ref 0.44–1.00)
GFR, Estimated: >60 mL/min (ref >60)
Glucose, Bld: 131 mg/dL — ABNORMAL HIGH (ref 70–99)
Potassium: 3.5 mmol/L (ref 3.5–5.1)
Sodium: 139 mmol/L (ref 135–145)
Total Bilirubin: 0.8 mg/dL (ref 0.3–1.2)
Total Protein: 7.2 g/dL (ref 6.5–8.1)

## 2023-03-11 LAB — CBC WITH DIFFERENTIAL/PLATELET
Abs Immature Granulocytes: 0.04 10*3/uL (ref 0.00–0.07)
Basophils Absolute: 0.1 10*3/uL (ref 0.0–0.1)
Basophils Relative: 1 %
Eosinophils Absolute: 0.1 10*3/uL (ref 0.0–0.5)
Eosinophils Relative: 1 %
HCT: 33 % — ABNORMAL LOW (ref 36.0–46.0)
Hemoglobin: 10.8 g/dL — ABNORMAL LOW (ref 12.0–15.0)
Immature Granulocytes: 1 %
Lymphocytes Relative: 16 %
Lymphs Abs: 1.3 10*3/uL (ref 0.7–4.0)
MCH: 30.3 pg (ref 26.0–34.0)
MCHC: 32.7 g/dL (ref 30.0–36.0)
MCV: 92.7 fL (ref 80.0–100.0)
Monocytes Absolute: 0.4 10*3/uL (ref 0.1–1.0)
Monocytes Relative: 5 %
Neutro Abs: 6.3 10*3/uL (ref 1.7–7.7)
Neutrophils Relative %: 76 %
Platelets: 177 10*3/uL (ref 150–400)
RBC: 3.56 MIL/uL — ABNORMAL LOW (ref 3.87–5.11)
RDW: 20.9 % — ABNORMAL HIGH (ref 11.5–15.5)
WBC: 8.3 10*3/uL (ref 4.0–10.5)
nRBC: 0 % (ref 0.0–0.2)

## 2023-03-11 LAB — FERRITIN: Ferritin: 152 ng/mL (ref 11–307)

## 2023-03-11 LAB — IRON AND TIBC
Iron: 92 ug/dL (ref 28–170)
Saturation Ratios: 29 % (ref 10.4–31.8)
TIBC: 321 ug/dL (ref 250–450)
UIBC: 229 ug/dL

## 2023-03-11 LAB — VITAMIN B12: Vitamin B-12: 406 pg/mL (ref 180–914)

## 2023-03-12 LAB — KAPPA/LAMBDA LIGHT CHAINS
Kappa free light chain: 19.3 mg/L (ref 3.3–19.4)
Kappa, lambda light chain ratio: 1.26 (ref 0.26–1.65)
Lambda free light chains: 15.3 mg/L (ref 5.7–26.3)

## 2023-03-12 LAB — AFP TUMOR MARKER: AFP, Serum, Tumor Marker: 2.9 ng/mL (ref 0.0–9.2)

## 2023-03-15 LAB — MULTIPLE MYELOMA PANEL, SERUM
Albumin SerPl Elph-Mcnc: 3.5 g/dL (ref 2.9–4.4)
Albumin/Glob SerPl: 1.1 (ref 0.7–1.7)
Alpha 1: 0.3 g/dL (ref 0.0–0.4)
Alpha2 Glob SerPl Elph-Mcnc: 0.6 g/dL (ref 0.4–1.0)
B-Globulin SerPl Elph-Mcnc: 1.9 g/dL — ABNORMAL HIGH (ref 0.7–1.3)
Gamma Glob SerPl Elph-Mcnc: 0.4 g/dL (ref 0.4–1.8)
Globulin, Total: 3.2 g/dL (ref 2.2–3.9)
IgA: 220 mg/dL (ref 64–422)
IgG (Immunoglobin G), Serum: 1606 mg/dL — ABNORMAL HIGH (ref 586–1602)
IgM (Immunoglobulin M), Srm: 51 mg/dL (ref 26–217)
M Protein SerPl Elph-Mcnc: 1.3 g/dL — ABNORMAL HIGH
Total Protein ELP: 6.7 g/dL (ref 6.0–8.5)

## 2023-03-16 ENCOUNTER — Encounter: Payer: Self-pay | Admitting: Oncology

## 2023-03-26 ENCOUNTER — Encounter: Payer: Self-pay | Admitting: Oncology

## 2023-04-01 ENCOUNTER — Other Ambulatory Visit: Payer: Self-pay | Admitting: Internal Medicine

## 2023-04-01 DIAGNOSIS — Z1231 Encounter for screening mammogram for malignant neoplasm of breast: Secondary | ICD-10-CM

## 2023-04-12 ENCOUNTER — Other Ambulatory Visit: Payer: Self-pay | Admitting: Internal Medicine

## 2023-04-12 DIAGNOSIS — E042 Nontoxic multinodular goiter: Secondary | ICD-10-CM

## 2023-04-14 DIAGNOSIS — H43813 Vitreous degeneration, bilateral: Secondary | ICD-10-CM | POA: Diagnosis not present

## 2023-04-14 DIAGNOSIS — E119 Type 2 diabetes mellitus without complications: Secondary | ICD-10-CM | POA: Diagnosis not present

## 2023-04-14 DIAGNOSIS — H538 Other visual disturbances: Secondary | ICD-10-CM | POA: Diagnosis not present

## 2023-04-14 LAB — HM DIABETES EYE EXAM

## 2023-04-22 DIAGNOSIS — E119 Type 2 diabetes mellitus without complications: Secondary | ICD-10-CM | POA: Diagnosis not present

## 2023-04-22 DIAGNOSIS — M5416 Radiculopathy, lumbar region: Secondary | ICD-10-CM | POA: Diagnosis not present

## 2023-04-27 DIAGNOSIS — D485 Neoplasm of uncertain behavior of skin: Secondary | ICD-10-CM | POA: Diagnosis not present

## 2023-04-27 DIAGNOSIS — D0472 Carcinoma in situ of skin of left lower limb, including hip: Secondary | ICD-10-CM | POA: Diagnosis not present

## 2023-04-27 DIAGNOSIS — D0461 Carcinoma in situ of skin of right upper limb, including shoulder: Secondary | ICD-10-CM | POA: Diagnosis not present

## 2023-04-28 NOTE — Telephone Encounter (Signed)
Error

## 2023-04-30 ENCOUNTER — Ambulatory Visit
Admission: RE | Admit: 2023-04-30 | Discharge: 2023-04-30 | Disposition: A | Discharge status: Home / Self Care | Payer: Medicare Other | Source: Ambulatory Visit | Attending: Internal Medicine | Admitting: Internal Medicine

## 2023-04-30 DIAGNOSIS — Z1231 Encounter for screening mammogram for malignant neoplasm of breast: Secondary | ICD-10-CM

## 2023-05-11 ENCOUNTER — Ambulatory Visit (INDEPENDENT_AMBULATORY_CARE_PROVIDER_SITE_OTHER): Payer: Medicare Other | Admitting: Internal Medicine

## 2023-05-11 ENCOUNTER — Encounter: Payer: Self-pay | Admitting: Internal Medicine

## 2023-05-11 VITALS — BP 144/66 | HR 66 | Ht 65.5 in | Wt 139.8 lb

## 2023-05-11 DIAGNOSIS — E1142 Type 2 diabetes mellitus with diabetic polyneuropathy: Secondary | ICD-10-CM

## 2023-05-11 DIAGNOSIS — E032 Hypothyroidism due to medicaments and other exogenous substances: Secondary | ICD-10-CM

## 2023-05-11 DIAGNOSIS — R1319 Other dysphagia: Secondary | ICD-10-CM | POA: Diagnosis not present

## 2023-05-11 DIAGNOSIS — Z7184 Encounter for health counseling related to travel: Secondary | ICD-10-CM

## 2023-05-11 DIAGNOSIS — E1169 Type 2 diabetes mellitus with other specified complication: Secondary | ICD-10-CM

## 2023-05-11 DIAGNOSIS — R5383 Other fatigue: Secondary | ICD-10-CM

## 2023-05-11 DIAGNOSIS — M810 Age-related osteoporosis without current pathological fracture: Secondary | ICD-10-CM | POA: Diagnosis not present

## 2023-05-11 DIAGNOSIS — E785 Hyperlipidemia, unspecified: Secondary | ICD-10-CM | POA: Diagnosis not present

## 2023-05-11 DIAGNOSIS — D472 Monoclonal gammopathy: Secondary | ICD-10-CM | POA: Diagnosis not present

## 2023-05-11 DIAGNOSIS — F418 Other specified anxiety disorders: Secondary | ICD-10-CM

## 2023-05-11 LAB — POCT GLYCOSYLATED HEMOGLOBIN (HGB A1C): Hemoglobin A1C: 6.4 % — AB (ref 4.0–5.6)

## 2023-05-11 MED ORDER — ALPRAZOLAM 0.5 MG PO TABS: 0.5000 mg | ORAL_TABLET | Freq: Two times a day (BID) | ORAL | 5 refills | Status: DC | PRN

## 2023-05-11 MED ORDER — BUSPIRONE HCL 10 MG PO TABS: 10.0000 mg | ORAL_TABLET | Freq: Three times a day (TID) | ORAL | 2 refills | Status: DC

## 2023-05-11 MED ORDER — PREDNISONE 10 MG PO TABS: ORAL_TABLET | ORAL | 0 refills | Status: DC

## 2023-05-11 NOTE — Progress Notes (Unsigned)
Subjective:  Patient ID: Megan Bradley, female    DOB: 1940-10-05  Age: 82 y.o. MRN: [id number]  CC: The primary encounter diagnosis was Osteoporosis of femur without pathological fracture. Diagnoses of Type 2 DM with diabetic neuropathy affecting both sides of body (HCC), Hyperlipidemia associated with type 2 diabetes mellitus (HCC), Other fatigue, MGUS (monoclonal gammopathy of unknown significance), Esophageal dysphagia, Iatrogenic hypothyroidism, Anxiety with obsessional features, and Travel advice encounter were also pertinent to this visit.   HPI Megan Bradley presents for  Chief Complaint  Patient presents with   Medical Management of Chronic Issues    3 month follow up on diabetes   Megan Bradley is an 82 yr old female with diet controlled Type 2 DM with neuropathy ,  nutcracker esophagus, and  pruriigo nodularis who presents for follow up oon type 2 DM  She feels generally well except for fatigue and moodiness.  She has not had any falls recently and had a very enjoyable Thanksgiving with community friends.  She is looking forward to a trip to [facility] in the next 10 days with friends.  She denies feelings of depression and is happy in her marriage.  However she continues to feel anxious and requires [place] of low dose alprazolam twice daily (morning and bedtime)    2) follow up on type 2 DM:  last A1c had jumped to 6.9 in August.  She has revised her diet somewhat.  Does not check BS at home  3) nutcracker esophagus:  reviewed last EGD and GI follow up.  Using hyoscyamine preemptively which she finds helpful.  Some trouble swallowing pills. . Has not ttried taking them with mild of other higher density liquids      Outpatient Medications Prior to Visit  Medication Sig Dispense Refill   DUPIXENT 300 MG/2ML SOPN Every other Friday     ezetimibe (ZETIA) 10 MG tablet Take 1 tablet (10 mg total) by mouth daily. 90 tablet 3   glucose blood (ACCU-CHEK GUIDE) test strip USE TO CHECK BLOOD SUGAR ONCE   DAILY 100 strip 2   hyoscyamine (ANASPAZ) 0.125 MG TBDP disintergrating tablet Place 1 tablet (0.125 mg total) under the tongue every 6 (six) hours as needed. 30 tablet 5   isosorbide mononitrate (IMDUR) 30 MG 24 hr tablet TAKE 1 TABLET BY MOUTH DAILY 90 tablet 3   levothyroxine (SYNTHROID) 100 MCG tablet TAKE 1 TABLET BY MOUTH DAILY  BEFORE BREAKFAST 100 tablet 2   meclizine (ANTIVERT) 25 MG tablet TAKE 1 TABLET BY MOUTH THREE TIMES DAILY AS NEEDED FOR VERTIGO 30 tablet 0   nitroGLYCERIN (NITROSTAT) 0.4 MG SL tablet Place 1 tablet (0.4 mg total) under the tongue once as needed for up to 1 dose for chest pain. Or esophageal spasm 30 tablet 0   omeprazole (PRILOSEC) 40 MG capsule Take 1 capsule (40 mg total) by mouth 2 (two) times daily. 180 capsule 3   rosuvastatin (CRESTOR) 20 MG tablet TAKE 1 TABLET BY MOUTH EVERY  OTHER DAY 45 tablet 3   sertraline (ZOLOFT) 100 MG tablet TAKE 2 TABLETS BY MOUTH DAILY 180 tablet 3   SODIUM FLUORIDE, DENTAL RINSE, 0.2 % SOLN Take by mouth as needed.     ALPRAZolam (XANAX) 0.5 MG tablet Take 1 tablet by mouth twice daily as needed for anxiety 60 tablet 2   amoxicillin (AMOXIL) 500 MG tablet Take 1 tablet (500 mg total) by mouth 2 (two) times daily. (Patient not taking: Reported on 05/11/2023) 14 tablet 0  chlorpheniramine-HYDROcodone (TUSSIONEX) 10-8 MG/5ML Take 5 mLs by mouth at bedtime as needed. (Patient not taking: Reported on 05/11/2023) 70 mL 0   meloxicam (MOBIC) 7.5 MG tablet Take 7.5 mg by mouth 2 (two) times daily. (Patient not taking: Reported on 05/11/2023)     Facility-Administered Medications Prior to Visit  Medication Dose Route Frequency Provider Last Rate Last Admin   clindamycin (CLEOCIN) IVPB 600 mg  600 mg Intravenous Once Juanell Fairly, MD        Review of Systems;  Patient denies headache, fevers, malaise, unintentional weight loss, skin rash, eye pain, sinus congestion and sinus pain, sore throat, dysphagia,  hemoptysis , cough, dyspnea,  wheezing, chest pain, palpitations, orthopnea, edema, abdominal pain, nausea, melena, diarrhea, constipation, flank pain, dysuria, hematuria, urinary  Frequency, nocturia, numbness, tingling, seizures,  Focal weakness, Loss of consciousness,  Tremor, insomnia, depression, anxiety, and suicidal ideation.      Objective:  BP (!) 144/66   Pulse 66   Ht 5' 5.5" (1.664 m)   Wt 139 lb 12.8 oz (63.4 kg)   SpO2 98%   BMI 22.91 kg/m   BP Readings from Last 3 Encounters:  05/11/23 (!) 144/66  03/03/23 114/72  02/04/23 122/60    Wt Readings from Last 3 Encounters:  05/11/23 139 lb 12.8 oz (63.4 kg)  03/03/23 142 lb 9.6 oz (64.7 kg)  02/04/23 142 lb 9.6 oz (64.7 kg)    Physical Exam Vitals reviewed.  Constitutional:      General: She is not in acute distress.    Appearance: Normal appearance. She is normal weight. She is not ill-appearing, toxic-appearing or diaphoretic.  HENT:     Head: Normocephalic.  Eyes:     General: No scleral icterus.       Right eye: No discharge.        Left eye: No discharge.     Conjunctiva/sclera: Conjunctivae normal.  Cardiovascular:     Rate and Rhythm: Normal rate and regular rhythm.     Heart sounds: Normal heart sounds.  Pulmonary:     Effort: Pulmonary effort is normal. No respiratory distress.     Breath sounds: Normal breath sounds.  Musculoskeletal:        General: Normal range of motion.  Skin:    General: Skin is warm and dry.  Neurological:     General: No focal deficit present.     Mental Status: She is alert and oriented to person, place, and time. Mental status is at baseline.  Psychiatric:        Mood and Affect: Mood normal.        Behavior: Behavior normal.        Thought Content: Thought content normal.        Judgment: Judgment normal.     Lab Results  Component Value Date   HGBA1C 6.4 (A) 05/11/2023   HGBA1C 6.9 (H) 02/04/2023   HGBA1C 7.4 (H) 08/06/2022    Lab Results  Component Value Date   CREATININE 0.93  03/11/2023   CREATININE 1.00 02/04/2023   CREATININE 0.88 09/02/2022    Lab Results  Component Value Date   WBC 8.3 03/11/2023   HGB 10.8 (L) 03/11/2023   HCT 33.0 (L) 03/11/2023   PLT 177 03/11/2023   GLUCOSE 131 (H) 03/11/2023   CHOL 124 02/04/2023   TRIG 101.0 02/04/2023   HDL 61.50 02/04/2023   LDLDIRECT 48.0 02/04/2023   LDLCALC 43 02/04/2023   ALT 16 03/11/2023   AST 17 03/11/2023  NA 139 03/11/2023   K 3.5 03/11/2023   CL 103 03/11/2023   CREATININE 0.93 03/11/2023   BUN 17 03/11/2023   CO2 27 03/11/2023   TSH 0.28 (L) 02/04/2023   INR 1.0 09/26/2020   HGBA1C 6.4 (A) 05/11/2023   MICROALBUR 1.5 08/07/2022    MM 3D SCREEN BREAST W/IMPLANT BILATERAL  Result Date: 05/04/2023 CLINICAL DATA:  Screening. EXAM: DIGITAL SCREENING BILATERAL MAMMOGRAM WITH IMPLANTS, CAD AND TOMOSYNTHESIS TECHNIQUE: Bilateral screening digital craniocaudal and mediolateral oblique mammograms were obtained. Bilateral screening digital breast tomosynthesis was performed. The images were evaluated with computer-aided detection. Standard and/or implant displaced views were performed. COMPARISON:  Previous exam(s). ACR Breast Density Category b: There are scattered areas of fibroglandular density. FINDINGS: The patient has prepectoral, silicone implants. There are no findings suspicious for malignancy. IMPRESSION: No mammographic evidence of malignancy. A result letter of this screening mammogram will be mailed directly to the patient. RECOMMENDATION: Screening mammogram in one year. (Code:SM-B-01Y) BI-RADS CATEGORY  1: Negative. Electronically Signed   By: Amie Portland M.D.   On: 05/04/2023 10:17    Assessment & Plan:  .Osteoporosis of femur without pathological fracture -     DG Bone Density; Future  Type 2 DM with diabetic neuropathy affecting both sides of body (HCC) Assessment & Plan: A1c has improved from6.9 to 6.4 without pharmacotherapy.  Dietary advice reviewed.  . Continue statin.    Lab Results  Component Value Date   HGBA1C 6.4 (A) 05/11/2023   Lab Results  Component Value Date   MICROALBUR 1.5 08/07/2022   MICROALBUR 1.0 07/30/2021   Lab Results  Component Value Date   CHOL 124 02/04/2023   HDL 61.50 02/04/2023   LDLCALC 43 02/04/2023   LDLDIRECT 48.0 02/04/2023   TRIG 101.0 02/04/2023   CHOLHDL 2 02/04/2023    Orders: -     Comprehensive metabolic panel -     POCT glycosylated hemoglobin (Hb A1C)  Hyperlipidemia associated with type 2 diabetes mellitus (HCC) Assessment & Plan: Managed with daily Zetia and QOD rosuvastatin per patient preference.  LDL is 48 and LFTs are normall.  Continue current management  Lab Results  Component Value Date   HGBA1C 6.4 (A) 05/11/2023    Lab Results  Component Value Date   MICROALBUR 1.5 08/07/2022   MICROALBUR 1.0 07/30/2021       Lab Results  Component Value Date   CHOL 124 02/04/2023   HDL 61.50 02/04/2023   LDLCALC 43 02/04/2023   LDLDIRECT 48.0 02/04/2023   TRIG 101.0 02/04/2023   CHOLHDL 2 02/04/2023   Lab Results  Component Value Date   ALT 16 03/11/2023   AST 17 03/11/2023   ALKPHOS 53 03/11/2023   BILITOT 0.8 03/11/2023     Orders: -     Lipid panel -     LDL cholesterol, direct  Other fatigue -     CBC with Differential/Platelet  MGUS (monoclonal gammopathy of unknown significance) Assessment & Plan: Repeated every 6 months.  Follow up with Dr Smith Robert . Hgb is stable  Lab Results  Component Value Date   WBC 8.3 03/11/2023   HGB 10.8 (L) 03/11/2023   HCT 33.0 (L) 03/11/2023   MCV 92.7 03/11/2023   PLT 177 03/11/2023     Orders: -     CBC with Differential/Platelet  Esophageal dysphagia Assessment & Plan: Secondary to presbyeosphagus,  nutcracker esophagus and Zenckers Diverticulum by prior EGD. . . Dietary modifications discussed    Iatrogenic hypothyroidism Assessment &  Plan: Her dose was rediced from 12 TO 100 MCG OF LEVOTHYROXINE DAILY  in late August.  Repeat  TSH is pending  Lab Results  Component Value Date   TSH 0.28 (L) 02/04/2023        Orders: -     TSH; Future  Anxiety with obsessional features Assessment & Plan: Her self esteem has been hurt by her skin and breast issues   Referral for counselling has been repeatedly deferred.  She reports improved self esteem and mood today but continues to use alprazolam twice daily.  The risks and benefits of  Chronic  benzodiazepine use were discussed with patient today including increased risk of dementia,  Addiction, and seizures if abruptly withdrawn  .  Patient was encouraged to reduce use of  alprazolam to once daily and use Buspar during the day  instead  .     Travel advice encounter Assessment & Plan: Patient was counselled on preventive measures to reduce risk of illness during upcoming flight /travle to [facility]   Other orders -     busPIRone HCl; Take 1 tablet (10 mg total) by mouth 3 (three) times daily.  Dispense: 90 tablet; Refill: 2 -     predniSONE; 6 tablets on Day 1 , then reduce by 1 tablet daily until gone  Dispense: 21 tablet; Refill: 0 -     ALPRAZolam; Take 1 tablet (0.5 mg total) by mouth 2 (two) times daily as needed. for anxiety  Dispense: 60 tablet; Refill: 5     I provided 30 minutes  on the day of this of face-to-face time encounter reviewing patient's last visit with me, patient's  most recent visit with hematology and   gastroenterology, recent surgical and non surgical procedures, previous  labs and imaging studies, counseling on currently addressed issues,  and post visit ordering to diagnostics and therapeutics .   Follow-up: Return in about 6 months (around 11/09/2023) for follow up diabetes.   Sherlene Shams, MD

## 2023-05-11 NOTE — Patient Instructions (Addendum)
TRIAL  OF BUSPIRONE FOR DAYTIME ANXIETY  .  CONTINUE ALPRAZOLAM AT NIGHT AND DURING FLIGHTS   PREDNISONE SENT TO LOCAL PHARMACY  TAKE WITH YOU TO [place]   A1C  IS EXCELLENT!!!!!   REMEMBER:  USE LIQUID TO WASH DOWN PILLS.  (OR SOGGY MUFFINS)    May the Lord give you peace and joy during this holiday season, and may the promise of His return bring you comfort and hope for the future.   i'LL SEE YOU IN [place] FOR A THURSDAY NIGHT DINNER

## 2023-05-12 DIAGNOSIS — Z7184 Encounter for health counseling related to travel: Secondary | ICD-10-CM | POA: Insufficient documentation

## 2023-05-12 LAB — CBC WITH DIFFERENTIAL/PLATELET
Basophils Absolute: 0 10*3/uL (ref 0.0–0.1)
Basophils Relative: 0.5 % (ref 0.0–3.0)
Eosinophils Absolute: 0.1 10*3/uL (ref 0.0–0.7)
Eosinophils Relative: 1.1 % (ref 0.0–5.0)
HCT: 37.1 % (ref 36.0–46.0)
Hemoglobin: 12.1 g/dL (ref 12.0–15.0)
Lymphocytes Relative: 18.8 % (ref 12.0–46.0)
Lymphs Abs: 1.6 10*3/uL (ref 0.7–4.0)
MCHC: 32.7 g/dL (ref 30.0–36.0)
MCV: 94.6 fL (ref 78.0–100.0)
Monocytes Absolute: 0.5 10*3/uL (ref 0.1–1.0)
Monocytes Relative: 5.4 % (ref 3.0–12.0)
Neutro Abs: 6.2 10*3/uL (ref 1.4–7.7)
Neutrophils Relative %: 74.2 % (ref 43.0–77.0)
Platelets: 169 10*3/uL (ref 150.0–400.0)
RBC: 3.93 Mil/uL (ref 3.87–5.11)
RDW: 22.5 % — ABNORMAL HIGH (ref 11.5–15.5)
WBC: 8.3 10*3/uL (ref 4.0–10.5)

## 2023-05-12 LAB — COMPREHENSIVE METABOLIC PANEL
ALT: 17 U/L (ref 0–35)
AST: 21 U/L (ref 0–37)
Albumin: 4.2 g/dL (ref 3.5–5.2)
Alkaline Phosphatase: 58 U/L (ref 39–117)
BUN: 21 mg/dL (ref 6–23)
CO2: 31 meq/L (ref 19–32)
Calcium: 9.9 mg/dL (ref 8.4–10.5)
Chloride: 105 meq/L (ref 96–112)
Creatinine, Ser: 0.91 mg/dL (ref 0.40–1.20)
GFR: 59.46 mL/min — ABNORMAL LOW (ref >60.00)
Glucose, Bld: 83 mg/dL (ref 70–99)
Potassium: 3.9 meq/L (ref 3.5–5.1)
Sodium: 142 meq/L (ref 135–145)
Total Bilirubin: 0.7 mg/dL (ref 0.2–1.2)
Total Protein: 8 g/dL (ref 6.0–8.3)

## 2023-05-12 LAB — LDL CHOLESTEROL, DIRECT: Direct LDL: 41 mg/dL

## 2023-05-12 LAB — LIPID PANEL
Cholesterol: 149 mg/dL (ref 0–200)
HDL: 77.8 mg/dL (ref >39.00)
LDL Cholesterol: 62 mg/dL (ref 0–99)
NonHDL: 70.91
Total CHOL/HDL Ratio: 2
Triglycerides: 46 mg/dL (ref 0.0–149.0)
VLDL: 9.2 mg/dL (ref 0.0–40.0)

## 2023-05-12 NOTE — Assessment & Plan Note (Signed)
Her self esteem has been hurt by her skin and breast issues   Referral for counselling has been repeatedly deferred.  She reports improved self esteem and mood today but continues to use alprazolam twice daily.  The risks and benefits of  Chronic  benzodiazepine use were discussed with patient today including increased risk of dementia,  Addiction, and seizures if abruptly withdrawn  .  Patient was encouraged to reduce use of  alprazolam to once daily and use Buspar during the day  instead  .

## 2023-05-12 NOTE — Assessment & Plan Note (Signed)
Managed with daily Zetia and QOD rosuvastatin per patient preference.  LDL is 48 and LFTs are normall.  Continue current management  Lab Results  Component Value Date   HGBA1C 6.4 (A) 05/11/2023    Lab Results  Component Value Date   MICROALBUR 1.5 08/07/2022   MICROALBUR 1.0 07/30/2021       Lab Results  Component Value Date   CHOL 124 02/04/2023   HDL 61.50 02/04/2023   LDLCALC 43 02/04/2023   LDLDIRECT 48.0 02/04/2023   TRIG 101.0 02/04/2023   CHOLHDL 2 02/04/2023   Lab Results  Component Value Date   ALT 16 03/11/2023   AST 17 03/11/2023   ALKPHOS 53 03/11/2023   BILITOT 0.8 03/11/2023

## 2023-05-12 NOTE — Assessment & Plan Note (Signed)
Patient was counselled on preventive measures to reduce risk of illness during upcoming flight /travle to [facility]

## 2023-05-12 NOTE — Assessment & Plan Note (Signed)
Repeated every 6 months.  Follow up with Dr Smith Robert . Hgb is stable  Lab Results  Component Value Date   WBC 8.3 03/11/2023   HGB 10.8 (L) 03/11/2023   HCT 33.0 (L) 03/11/2023   MCV 92.7 03/11/2023   PLT 177 03/11/2023

## 2023-05-12 NOTE — Assessment & Plan Note (Signed)
Her dose was rediced from 12 TO 100 MCG OF LEVOTHYROXINE DAILY  in late August.  Repeat TSH is pending  Lab Results  Component Value Date   TSH 0.28 (L) 02/04/2023

## 2023-05-12 NOTE — Assessment & Plan Note (Signed)
Secondary to presbyeosphagus,  nutcracker esophagus and Zenckers Diverticulum by prior EGD. . . Dietary modifications discussed

## 2023-05-12 NOTE — Assessment & Plan Note (Signed)
A1c has improved from6.9 to 6.4 without pharmacotherapy.  Dietary advice reviewed.  . Continue statin.   Lab Results  Component Value Date   HGBA1C 6.4 (A) 05/11/2023   Lab Results  Component Value Date   MICROALBUR 1.5 08/07/2022   MICROALBUR 1.0 07/30/2021   Lab Results  Component Value Date   CHOL 124 02/04/2023   HDL 61.50 02/04/2023   LDLCALC 43 02/04/2023   LDLDIRECT 48.0 02/04/2023   TRIG 101.0 02/04/2023   CHOLHDL 2 02/04/2023

## 2023-05-13 ENCOUNTER — Ambulatory Visit (INDEPENDENT_AMBULATORY_CARE_PROVIDER_SITE_OTHER): Payer: Medicare Other

## 2023-05-13 DIAGNOSIS — E032 Hypothyroidism due to medicaments and other exogenous substances: Secondary | ICD-10-CM

## 2023-05-13 LAB — TSH: TSH: 2.03 u[IU]/mL (ref 0.35–5.50)

## 2023-05-15 DIAGNOSIS — M5416 Radiculopathy, lumbar region: Secondary | ICD-10-CM | POA: Diagnosis not present

## 2023-05-26 DIAGNOSIS — C44722 Squamous cell carcinoma of skin of right lower limb, including hip: Secondary | ICD-10-CM | POA: Diagnosis not present

## 2023-06-12 ENCOUNTER — Encounter: Payer: Self-pay | Admitting: Internal Medicine

## 2023-06-12 MED ORDER — AZITHROMYCIN 500 MG PO TABS: 500.0000 mg | ORAL_TABLET | Freq: Every day | ORAL | 0 refills | Status: DC

## 2023-06-25 DIAGNOSIS — Z85828 Personal history of other malignant neoplasm of skin: Secondary | ICD-10-CM | POA: Diagnosis not present

## 2023-07-31 ENCOUNTER — Other Ambulatory Visit
Admission: RE | Admit: 2023-07-31 | Discharge: 2023-07-31 | Disposition: A | Discharge status: Home / Self Care | Payer: Self-pay | Source: Ambulatory Visit | Attending: Medical Genetics | Admitting: Medical Genetics

## 2023-07-31 DIAGNOSIS — Z006 Encounter for examination for normal comparison and control in clinical research program: Secondary | ICD-10-CM | POA: Insufficient documentation

## 2023-08-10 ENCOUNTER — Encounter: Payer: Self-pay | Admitting: Internal Medicine

## 2023-08-10 ENCOUNTER — Ambulatory Visit (INDEPENDENT_AMBULATORY_CARE_PROVIDER_SITE_OTHER): Payer: Medicare Other | Admitting: Internal Medicine

## 2023-08-10 VITALS — BP 140/72 | HR 70 | Ht 65.5 in | Wt 142.6 lb

## 2023-08-10 DIAGNOSIS — K222 Esophageal obstruction: Secondary | ICD-10-CM

## 2023-08-10 DIAGNOSIS — E1142 Type 2 diabetes mellitus with diabetic polyneuropathy: Secondary | ICD-10-CM

## 2023-08-10 DIAGNOSIS — E785 Hyperlipidemia, unspecified: Secondary | ICD-10-CM | POA: Diagnosis not present

## 2023-08-10 DIAGNOSIS — H6120 Impacted cerumen, unspecified ear: Secondary | ICD-10-CM | POA: Diagnosis not present

## 2023-08-10 DIAGNOSIS — K225 Diverticulum of esophagus, acquired: Secondary | ICD-10-CM | POA: Diagnosis not present

## 2023-08-10 DIAGNOSIS — H6123 Impacted cerumen, bilateral: Secondary | ICD-10-CM | POA: Diagnosis not present

## 2023-08-10 DIAGNOSIS — K224 Dyskinesia of esophagus: Secondary | ICD-10-CM | POA: Diagnosis not present

## 2023-08-10 DIAGNOSIS — M810 Age-related osteoporosis without current pathological fracture: Secondary | ICD-10-CM

## 2023-08-10 DIAGNOSIS — E1169 Type 2 diabetes mellitus with other specified complication: Secondary | ICD-10-CM

## 2023-08-10 MED ORDER — EZETIMIBE 10 MG PO TABS: 10.0000 mg | ORAL_TABLET | Freq: Every day | ORAL | 3 refills | Status: DC

## 2023-08-10 MED ORDER — RALOXIFENE HCL 60 MG PO TABS: 60.0000 mg | ORAL_TABLET | Freq: Every day | ORAL | 2 refills | Status: DC

## 2023-08-10 NOTE — Progress Notes (Unsigned)
 Subjective:  Patient ID: Megan Bradley, female    DOB: 01/05/1941  Age: 83 y.o. MRN: [id number]  CC: The primary encounter diagnosis was Type 2 DM with diabetic neuropathy affecting both sides of body (HCC). Diagnoses of Hyperlipidemia associated with type 2 diabetes mellitus (HCC), Osteoporosis of femur without pathological fracture, Impacted cerumen, unspecified laterality, Bilateral impacted cerumen, Zenkers diverticulum, Schatzki's ring of distal esophagus, and Nutcracker esophagus were also pertinent to this visit.   HPI Megan Bradley presents for  Chief Complaint  Patient presents with   Medical Management of Chronic Issues    3 month follow up on diabetes   1) Type 2 DM:  she has been eating a lot of ice cream because of her dysphagia , which persists because of multiple esophageal issues which have  been evaluated fully .  She is maintaining her weight.  Not checking blood sugars  , and thus far has been able to control her diabetes with diet.      Lab Results  Component Value Date   HGBA1C 6.4 (A) 05/11/2023   2) Prurigo  nodularis  getting Dupixent every other Friday by [facility] . The medication does seem to be helping   3)Esophageal dysphagia:  multiple contributors:   Nutcracker esophagus.  Zencker's diverticulum:  using hyoscyamine preemptively when she develops jaw/ear pain ,  and taking low dose  Imdur  instead of diltiazem .  Recent EGD with attempts to dilate stricture were unsuccessful  by  Erskine Emery,  [facility] . Has learned to take pills with thickened liquids   4) Hypothyroid:  taking levothyroxine   5) Osteoporosis  by  2019 DEXA,  she has continually deferred treatment due to   fear of OCN    She has  received 3  steroid injections in the lumbar vertebrae for severe DDD with left radiculopathy .  Her  DEXA has been postponed until October.  Discussed risks and benefits of various therapies , particulalry Evista   6) HTN: Hypertension: patient checks blood pressure  twice weekly at home.  Readings have been for the most part <120/80 at rest . Patient is following a reduced salt diet most days and is taking medications as prescribed home reading this morning was  110/60  7) GAD: using buspirone in the am instead of alprazolam      Outpatient Medications Prior to Visit  Medication Sig Dispense Refill   ALPRAZolam (XANAX) 0.5 MG tablet Take 1 tablet (0.5 mg total) by mouth 2 (two) times daily as needed. for anxiety 60 tablet 5   busPIRone (BUSPAR) 10 MG tablet Take 1 tablet (10 mg total) by mouth 3 (three) times daily. 90 tablet 2   DUPIXENT 300 MG/2ML SOPN Every other Friday     glucose blood (ACCU-CHEK GUIDE) test strip USE TO CHECK BLOOD SUGAR ONCE  DAILY 100 strip 2   hyoscyamine (ANASPAZ) 0.125 MG TBDP disintergrating tablet Place 1 tablet (0.125 mg total) under the tongue every 6 (six) hours as needed. 30 tablet 5   isosorbide mononitrate (IMDUR) 30 MG 24 hr tablet TAKE 1 TABLET BY MOUTH DAILY 90 tablet 3   levothyroxine (SYNTHROID) 100 MCG tablet TAKE 1 TABLET BY MOUTH DAILY  BEFORE BREAKFAST 100 tablet 2   meclizine (ANTIVERT) 25 MG tablet TAKE 1 TABLET BY MOUTH THREE TIMES DAILY AS NEEDED FOR VERTIGO 30 tablet 0   nitroGLYCERIN (NITROSTAT) 0.4 MG SL tablet Place 1 tablet (0.4 mg total) under the tongue once as  needed for up to 1 dose for chest pain. Or esophageal spasm 30 tablet 0   omeprazole (PRILOSEC) 40 MG capsule Take 1 capsule (40 mg total) by mouth 2 (two) times daily. 180 capsule 3   rosuvastatin (CRESTOR) 20 MG tablet TAKE 1 TABLET BY MOUTH EVERY  OTHER DAY 45 tablet 3   sertraline (ZOLOFT) 100 MG tablet TAKE 2 TABLETS BY MOUTH DAILY 180 tablet 3   SODIUM FLUORIDE, DENTAL RINSE, 0.2 % SOLN Take by mouth as needed.     ezetimibe (ZETIA) 10 MG tablet Take 1 tablet (10 mg total) by mouth daily. 90 tablet 3   azithromycin (ZITHROMAX) 500 MG tablet Take 1 tablet (500 mg total) by mouth daily. 7 tablet 0   predniSONE (DELTASONE) 10 MG tablet 6  tablets on Day 1 , then reduce by 1 tablet daily until gone 21 tablet 0   Facility-Administered Medications Prior to Visit  Medication Dose Route Frequency Provider Last Rate Last Admin   clindamycin (CLEOCIN) IVPB 600 mg  600 mg Intravenous Once Juanell Fairly, MD        Review of Systems;  Patient denies headache, fevers, malaise, unintentional weight loss, skin rash, eye pain, sinus congestion and sinus pain, sore throat, dysphagia,  hemoptysis , cough, dyspnea, wheezing, chest pain, palpitations, orthopnea, edema, abdominal pain, nausea, melena, diarrhea, constipation, flank pain, dysuria, hematuria, urinary  Frequency, nocturia, numbness, tingling, seizures,  Focal weakness, Loss of consciousness,  Tremor, insomnia, depression, anxiety, and suicidal ideation.      Objective:  BP (!) 140/72   Pulse 70   Ht 5' 5.5" (1.664 m)   Wt 142 lb 9.6 oz (64.7 kg)   SpO2 98%   BMI 23.37 kg/m   BP Readings from Last 3 Encounters:  08/10/23 (!) 140/72  05/11/23 (!) 144/66  03/03/23 114/72    Wt Readings from Last 3 Encounters:  08/10/23 142 lb 9.6 oz (64.7 kg)  05/11/23 139 lb 12.8 oz (63.4 kg)  03/03/23 142 lb 9.6 oz (64.7 kg)    Physical Exam Vitals reviewed.  Constitutional:      General: She is not in acute distress.    Appearance: Normal appearance. She is normal weight. She is not ill-appearing, toxic-appearing or diaphoretic.  HENT:     Head: Normocephalic.  Eyes:     General: No scleral icterus.       Right eye: No discharge.        Left eye: No discharge.     Conjunctiva/sclera: Conjunctivae normal.  Cardiovascular:     Rate and Rhythm: Normal rate and regular rhythm.     Heart sounds: Normal heart sounds.  Pulmonary:     Effort: Pulmonary effort is normal. No respiratory distress.     Breath sounds: Normal breath sounds.  Musculoskeletal:        General: Normal range of motion.  Skin:    General: Skin is warm and dry.  Neurological:     General: No focal  deficit present.     Mental Status: She is alert and oriented to person, place, and time. Mental status is at baseline.  Psychiatric:        Mood and Affect: Mood normal.        Behavior: Behavior normal.        Thought Content: Thought content normal.        Judgment: Judgment normal.    Lab Results  Component Value Date   HGBA1C 6.4 (A) 05/11/2023   HGBA1C 6.9 (  H) 02/04/2023   HGBA1C 7.4 (H) 08/06/2022    Lab Results  Component Value Date   CREATININE 0.91 05/11/2023   CREATININE 0.93 03/11/2023   CREATININE 1.00 02/04/2023    Lab Results  Component Value Date   WBC 8.3 05/11/2023   HGB 12.1 05/11/2023   HCT 37.1 05/11/2023   PLT 169.0 05/11/2023   GLUCOSE 83 05/11/2023   CHOL 149 05/11/2023   TRIG 46.0 05/11/2023   HDL 77.80 05/11/2023   LDLDIRECT 41.0 05/11/2023   LDLCALC 62 05/11/2023   ALT 17 05/11/2023   AST 21 05/11/2023   NA 142 05/11/2023   K 3.9 05/11/2023   CL 105 05/11/2023   CREATININE 0.91 05/11/2023   BUN 21 05/11/2023   CO2 31 05/11/2023   TSH 2.03 05/13/2023   INR 1.0 09/26/2020   HGBA1C 6.4 (A) 05/11/2023   MICROALBUR 1.5 08/07/2022    No results found.  Assessment & Plan:  .Type 2 DM with diabetic neuropathy affecting both sides of body (HCC) Assessment & Plan: A1c has improved from  6.9 to 6.4 without pharmacotherapy.  Repeat 11c and urine micoalb/cr ratio have been drawn and are pending . Will consider SGLT2 inhibitor if > 6.5  Dietary advice reviewed.  . Continue statin.   Lab Results  Component Value Date   HGBA1C 6.4 (A) 05/11/2023   Lab Results  Component Value Date   MICROALBUR 1.5 08/07/2022   MICROALBUR 1.0 07/30/2021   Lab Results  Component Value Date   CHOL 149 05/11/2023   HDL 77.80 05/11/2023   LDLCALC 62 05/11/2023   LDLDIRECT 41.0 05/11/2023   TRIG 46.0 05/11/2023   CHOLHDL 2 05/11/2023    Orders: -     Microalbumin / creatinine urine ratio -     Hemoglobin A1c -     Comprehensive metabolic  panel  Hyperlipidemia associated with type 2 diabetes mellitus (HCC) -     Lipid panel -     LDL cholesterol, direct  Osteoporosis of femur without pathological fracture Assessment & Plan: Last DEXA was 2019.  She has agreed to trial of Evista after discussion available options, risks and benefits.  Baseline DEXA ordered    Impacted cerumen, unspecified laterality  Bilateral impacted cerumen -     Ambulatory referral to ENT  Zenkers diverticulum Assessment & Plan: Her recent EGD by [facility] specialist did not confirm the presence of a ZD that could be corrected. Per August 2024 report:  " subtle Cricopharyngeal bar wthout clear Zenker's pouch. This area was not large enough to safely pursue  Z-POEM given lack of definition of CP bar/landmarks. A TTS dilator was passed through the scope. Dilation  with a 12-13.5-15 mm balloon dilator was performed to 15 mm under fluoroscopic guidance. The dilation site  was examined following endoscope reinsertion and showed no change. "   Schatzki's ring of distal esophagus Assessment & Plan: Mild, along with [facility] on August 2024 EGD.    Nutcracker esophagus Assessment & Plan: Recurrent spasms have decreased in frequency with use of Imdur and preventive doses of hyoscyamine   Other orders -     Raloxifene HCl; Take 1 tablet (60 mg total) by mouth daily.  Dispense: 30 tablet; Refill: 2 -     Ezetimibe; Take 1 tablet (10 mg total) by mouth daily.  Dispense: 90 tablet; Refill: 3     I spent 34 minutes on the day of this face to face encounter reviewing patient's  most recent visit with  gastroenterology, ,  prior relevant surgical and non surgical procedures, recent  labs and imaging studies, counseling on diabetes, osteoporosis and  dysphagia management,  reviewing the assessment and plan with patient, and post visit ordering and reviewing of  diagnostics and therapeutics with patient  .   Follow-up: Return in about 3 months (around 11/09/2023) for  follow up diabetes.   Sherlene Shams, MD

## 2023-08-10 NOTE — Assessment & Plan Note (Signed)
Last DEXA was 2019.  Will repeat this year and strongly advise treatment

## 2023-08-10 NOTE — Patient Instructions (Addendum)
 Good to see you!  Glad you are feeling happy!  I am recommending that you start a trial of Evista (raloxifene)  to manage your osteoporosis   Take it once daily   Suspend the medication one week prior to long flights/trips or surgeries and resume once the trip surgery has been complete  We will not start medication for diabetes unless your A1c is > 6.5

## 2023-08-11 ENCOUNTER — Encounter: Payer: Self-pay | Admitting: Internal Medicine

## 2023-08-11 DIAGNOSIS — K222 Esophageal obstruction: Secondary | ICD-10-CM | POA: Insufficient documentation

## 2023-08-11 LAB — COMPREHENSIVE METABOLIC PANEL
ALT: 14 U/L (ref 0–35)
AST: 17 U/L (ref 0–37)
Albumin: 4.1 g/dL (ref 3.5–5.2)
Alkaline Phosphatase: 51 U/L (ref 39–117)
BUN: 22 mg/dL (ref 6–23)
CO2: 29 meq/L (ref 19–32)
Calcium: 9.4 mg/dL (ref 8.4–10.5)
Chloride: 105 meq/L (ref 96–112)
Creatinine, Ser: 0.85 mg/dL (ref 0.40–1.20)
GFR: 64.41 mL/min (ref >60.00)
Glucose, Bld: 86 mg/dL (ref 70–99)
Potassium: 3.9 meq/L (ref 3.5–5.1)
Sodium: 140 meq/L (ref 135–145)
Total Bilirubin: 0.6 mg/dL (ref 0.2–1.2)
Total Protein: 7.4 g/dL (ref 6.0–8.3)

## 2023-08-11 LAB — LDL CHOLESTEROL, DIRECT: Direct LDL: 30 mg/dL

## 2023-08-11 LAB — HEMOGLOBIN A1C: Hgb A1c MFr Bld: 6.9 % — ABNORMAL HIGH (ref 4.6–6.5)

## 2023-08-11 LAB — LIPID PANEL
Cholesterol: 122 mg/dL (ref 0–200)
HDL: 76.2 mg/dL (ref >39.00)
LDL Cholesterol: 38 mg/dL (ref 0–99)
NonHDL: 45.83
Total CHOL/HDL Ratio: 2
Triglycerides: 38 mg/dL (ref 0.0–149.0)
VLDL: 7.6 mg/dL (ref 0.0–40.0)

## 2023-08-11 LAB — MICROALBUMIN / CREATININE URINE RATIO
Creatinine,U: 111.8 mg/dL
Microalb Creat Ratio: 14.7 mg/g (ref 0.0–30.0)
Microalb, Ur: 1.6 mg/dL (ref 0.0–1.9)

## 2023-08-11 NOTE — Assessment & Plan Note (Addendum)
 A1c has improved from  6.9 to 6.4 without pharmacotherapy.  Repeat 11c and urine micoalb/cr ratio have been drawn and are pending . Will consider SGLT2 inhibitor if > 6.5  Dietary advice reviewed.  . Continue statin.   Lab Results  Component Value Date   HGBA1C 6.4 (A) 05/11/2023   Lab Results  Component Value Date   MICROALBUR 1.5 08/07/2022   MICROALBUR 1.0 07/30/2021   Lab Results  Component Value Date   CHOL 149 05/11/2023   HDL 77.80 05/11/2023   LDLCALC 62 05/11/2023   LDLDIRECT 41.0 05/11/2023   TRIG 46.0 05/11/2023   CHOLHDL 2 05/11/2023

## 2023-08-11 NOTE — Assessment & Plan Note (Signed)
 Recurrent spasms have decreased in frequency with use of Imdur and preventive doses of hyoscyamine

## 2023-08-11 NOTE — Assessment & Plan Note (Signed)
 Mild, along with [facility] on August 2024 EGD.

## 2023-08-11 NOTE — Assessment & Plan Note (Signed)
 Her recent EGD by [facility] specialist did not confirm the presence of a ZD that could be corrected. Per August 2024 report:  " subtle Cricopharyngeal bar wthout clear Zenker's pouch. This area was not large enough to safely pursue  Z-POEM given lack of definition of CP bar/landmarks. A TTS dilator was passed through the scope. Dilation  with a 12-13.5-15 mm balloon dilator was performed to 15 mm under fluoroscopic guidance. The dilation site  was examined following endoscope reinsertion and showed no change. "

## 2023-08-12 LAB — GENECONNECT MOLECULAR SCREEN: Genetic Analysis Overall Interpretation: NEGATIVE

## 2023-08-19 DIAGNOSIS — M5416 Radiculopathy, lumbar region: Secondary | ICD-10-CM | POA: Diagnosis not present

## 2023-09-02 DIAGNOSIS — K225 Diverticulum of esophagus, acquired: Secondary | ICD-10-CM | POA: Diagnosis not present

## 2023-09-02 DIAGNOSIS — H93293 Other abnormal auditory perceptions, bilateral: Secondary | ICD-10-CM | POA: Diagnosis not present

## 2023-09-02 DIAGNOSIS — H6123 Impacted cerumen, bilateral: Secondary | ICD-10-CM | POA: Diagnosis not present

## 2023-09-09 ENCOUNTER — Encounter: Payer: Self-pay | Admitting: Oncology

## 2023-09-09 ENCOUNTER — Inpatient Hospital Stay: Payer: Medicare Other | Admitting: Oncology

## 2023-09-09 ENCOUNTER — Inpatient Hospital Stay: Payer: Medicare Other | Attending: Oncology

## 2023-09-09 VITALS — BP 109/68 | HR 72 | Temp 98.7°F | Resp 19 | Ht 65.5 in | Wt 140.3 lb

## 2023-09-09 DIAGNOSIS — E785 Hyperlipidemia, unspecified: Secondary | ICD-10-CM | POA: Insufficient documentation

## 2023-09-09 DIAGNOSIS — D649 Anemia, unspecified: Secondary | ICD-10-CM | POA: Diagnosis not present

## 2023-09-09 DIAGNOSIS — F419 Anxiety disorder, unspecified: Secondary | ICD-10-CM | POA: Diagnosis not present

## 2023-09-09 DIAGNOSIS — D472 Monoclonal gammopathy: Secondary | ICD-10-CM | POA: Insufficient documentation

## 2023-09-09 DIAGNOSIS — Z79899 Other long term (current) drug therapy: Secondary | ICD-10-CM | POA: Diagnosis not present

## 2023-09-09 DIAGNOSIS — E039 Hypothyroidism, unspecified: Secondary | ICD-10-CM | POA: Insufficient documentation

## 2023-09-09 DIAGNOSIS — E119 Type 2 diabetes mellitus without complications: Secondary | ICD-10-CM | POA: Diagnosis not present

## 2023-09-09 LAB — COMPREHENSIVE METABOLIC PANEL WITH GFR
ALT: 18 U/L (ref 0–44)
AST: 22 U/L (ref 15–41)
Albumin: 4 g/dL (ref 3.5–5.0)
Alkaline Phosphatase: 46 U/L (ref 38–126)
Anion gap: 7 (ref 5–15)
BUN: 21 mg/dL (ref 8–23)
CO2: 26 mmol/L (ref 22–32)
Calcium: 9 mg/dL (ref 8.9–10.3)
Chloride: 105 mmol/L (ref 98–111)
Creatinine, Ser: 0.98 mg/dL (ref 0.44–1.00)
GFR, Estimated: 58 mL/min — ABNORMAL LOW (ref >60)
Glucose, Bld: 92 mg/dL (ref 70–99)
Potassium: 3.7 mmol/L (ref 3.5–5.1)
Sodium: 138 mmol/L (ref 135–145)
Total Bilirubin: 1.1 mg/dL (ref 0.0–1.2)
Total Protein: 7.4 g/dL (ref 6.5–8.1)

## 2023-09-09 LAB — CBC WITH DIFFERENTIAL/PLATELET
Abs Immature Granulocytes: 0.02 10*3/uL (ref 0.00–0.07)
Basophils Absolute: 0.1 10*3/uL (ref 0.0–0.1)
Basophils Relative: 1 %
Eosinophils Absolute: 0.1 10*3/uL (ref 0.0–0.5)
Eosinophils Relative: 1 %
HCT: 33.9 % — ABNORMAL LOW (ref 36.0–46.0)
Hemoglobin: 11.2 g/dL — ABNORMAL LOW (ref 12.0–15.0)
Immature Granulocytes: 0 %
Lymphocytes Relative: 21 %
Lymphs Abs: 1.4 10*3/uL (ref 0.7–4.0)
MCH: 31 pg (ref 26.0–34.0)
MCHC: 33 g/dL (ref 30.0–36.0)
MCV: 93.9 fL (ref 80.0–100.0)
Monocytes Absolute: 0.4 10*3/uL (ref 0.1–1.0)
Monocytes Relative: 6 %
Neutro Abs: 4.6 10*3/uL (ref 1.7–7.7)
Neutrophils Relative %: 71 %
Platelets: 151 10*3/uL (ref 150–400)
RBC: 3.61 MIL/uL — ABNORMAL LOW (ref 3.87–5.11)
RDW: 21.5 % — ABNORMAL HIGH (ref 11.5–15.5)
WBC: 6.6 10*3/uL (ref 4.0–10.5)
nRBC: 0 % (ref 0.0–0.2)

## 2023-09-09 LAB — VITAMIN B12: Vitamin B-12: 392 pg/mL (ref 180–914)

## 2023-09-09 LAB — IRON AND TIBC
Iron: 140 ug/dL (ref 28–170)
Saturation Ratios: 39 % — ABNORMAL HIGH (ref 10.4–31.8)
TIBC: 358 ug/dL (ref 250–450)
UIBC: 218 ug/dL

## 2023-09-09 LAB — FERRITIN: Ferritin: 244 ng/mL (ref 11–307)

## 2023-09-10 LAB — KAPPA/LAMBDA LIGHT CHAINS
Kappa free light chain: 17.7 mg/L (ref 3.3–19.4)
Kappa, lambda light chain ratio: 1.39 (ref 0.26–1.65)
Lambda free light chains: 12.7 mg/L (ref 5.7–26.3)

## 2023-09-12 NOTE — Progress Notes (Signed)
 Hematology/Oncology Consult note [facility]  [phone] [phone]  Patient Care Team: Sherlene Shams, MD as PCP - General (Internal Medicine) Antonieta Iba, MD as PCP - Cardiology (Cardiology) Creig Hines, MD as Consulting Physician (Oncology)   Name of the patient: Megan Bradley  [id number]  1941/02/26   Date of visit: 09/12/23  Diagnosis- IgG kappa MGUS  Chief complaint/ Reason for visit- routine f/u of MGUS  Heme/Onc history: Patient is a 83 year old female with past medical history significant for type 2 diabetes hyperlipidemia hypothyroidism who has been referred for biclonal gammopathy and concern for pernicious anemia.  Her recent CBC from 01/07/2021 showed white count of 6.6, H&H of 10.4/32.5 with a platelet count of 172.  B12 level was normal at 396 in July 2022.  Iron studies in July showed an elevated ferritin of 344 with an iron saturation of 17%.  Urine protein electrophoresis showed Bence-Jones protein positive IgG kappa serum protein electrophoresis showed 2 possible abnormal protein bands that may represent monoclonal immunoglobulin light chains.  Intrinsic factor antibody was negative a year ago and positive in August 2022.  Looking back at her prior labs patient's hemoglobin was around 12 until December 2021.  It was 11.4 in April 2022 and then went down to 8.9 in May 2022 when she was admitted for right total knee replacement.  Following that her hemoglobin went up to 11.3 in July 2022   Results of blood work from 01/29/2021 were as follows: CBC showed white count of 8, H&H of 11.2/34.6 with an MCV of 91 and a platelet count of 195.  Myeloma panel showed 0.9 g of IgG kappa M protein.  Serum kappa free light chain mildly elevated at 25 with an normal light chain ratio 1.5.  Haptoglobin normal at 146.  B12 levels normal at 475.  LDH normal at 135.  Interval history- she reports doing well overall. Denies any changes in her  appetite or weight. Denies any new aches/ pains anywhere  ECOG PS- 1 Pain scale- 0   Review of systems- Review of Systems  Constitutional:  Negative for chills, fever, malaise/fatigue and weight loss.  HENT:  Negative for congestion, ear discharge and nosebleeds.   Eyes:  Negative for blurred vision.  Respiratory:  Negative for cough, hemoptysis, sputum production, shortness of breath and wheezing.   Cardiovascular:  Negative for chest pain, palpitations, orthopnea and claudication.  Gastrointestinal:  Negative for abdominal pain, blood in stool, constipation, diarrhea, heartburn, melena, nausea and vomiting.  Genitourinary:  Negative for dysuria, flank pain, frequency, hematuria and urgency.  Musculoskeletal:  Negative for back pain, joint pain and myalgias.  Skin:  Negative for rash.  Neurological:  Negative for dizziness, tingling, focal weakness, seizures, weakness and headaches.  Endo/Heme/Allergies:  Does not bruise/bleed easily.  Psychiatric/Behavioral:  Negative for depression and suicidal ideas. The patient does not have insomnia.       Allergies  Allergen Reactions   Atorvastatin Other (See Comments)    Muscle cramps and weakness   Codeine Nausea And Vomiting   Fentanyl Nausea And Vomiting     Past Medical History:  Diagnosis Date   Anxiety    Anxiety disorder    Aortic atherosclerosis (HCC)    Arthritis    both knees   Breast disorder in female 10/14/2019   Cervical dysplasia    a. 2010 - high grade squamous intraepithelial lesion s/p excision.   Chest pain    a. 2006 or  2007 Cath [facility]): reportedly nl cors;  b. 09/2011 ETT: nl.   DOE (dyspnea on exertion)    Family history of adverse reaction to anesthesia    grand daughter was hard to wake up after back surgery   GERD (gastroesophageal reflux disease)    Grade II diastolic dysfunction    Herpes zoster    Hypothyroidism    Mild aortic regurgitation    Nutcracker esophagus    Orthostatic hypotension     Pneumonia    Pre-diabetes    Ruptured left breast implant, initial encounter 07/05/2018   Sleep apnea    does not use cpap, did not tolerate   Vertigo    Wears dentures    partial lower     Past Surgical History:  Procedure Laterality Date   AUGMENTATION MAMMAPLASTY Bilateral    BREAST EXCISIONAL BIOPSY Left    CARDIAC CATHETERIZATION  2003   Dr.Callwood, R/L heart cath,   COLONOSCOPY WITH PROPOFOL N/A 06/12/2017   Procedure: COLONOSCOPY WITH PROPOFOL;  Surgeon: Midge Minium, MD;  Location: [facility];  Service: Endoscopy;  Laterality: N/A;   dental work     ESOPHAGEAL MANOMETRY N/A 06/19/2016   Procedure: ESOPHAGEAL MANOMETRY (EM);  Surgeon: Midge Minium, MD;  Location: [facility];  Service: Endoscopy;  Laterality: N/A;   ESOPHAGOGASTRODUODENOSCOPY (EGD) WITH PROPOFOL N/A 04/03/2017   Procedure: ESOPHAGOGASTRODUODENOSCOPY (EGD) WITH PROPOFOL;  Surgeon: Midge Minium, MD;  Location: [facility];  Service: Endoscopy;  Laterality: N/A;  diabetic - diet controlled   THYROIDECTOMY  05/15/15   [facility]   TONSILLECTOMY     TOTAL KNEE ARTHROPLASTY Right 10/25/2020   Procedure: RIGHT TOTAL KNEE ARTHROPLASTY;  Surgeon: Juanell Fairly, MD;  Location: [facility] ORS;  Service: Orthopedics;  Laterality: Right;    Social History   Socioeconomic History   Marital status: Married    Spouse name: Jeannett Senior   Number of children: 1   Years of education: Not on file   Highest education level: Bachelor's degree (e.g., BA, AB, BS)  Occupational History   Not on file  Tobacco Use   Smoking status: Never   Smokeless tobacco: Never  Vaping Use   Vaping status: Never Used  Substance and Sexual Activity   Alcohol use: Yes    Comment: wine once a week   Drug use: No   Sexual activity: Never  Other Topics Concern   Not on file  Social History Narrative   Lives in [place].  Active but doesn't exercise. Retired from [employer].   Social Drivers of Corporate investment banker Strain: Low Risk   (08/06/2023)   Overall Financial Resource Strain (CARDIA)    Difficulty of Paying Living Expenses: Not hard at all  Food Insecurity: No Food Insecurity (08/06/2023)   Hunger Vital Sign    Worried About Running Out of Food in the Last Year: Never true    Ran Out of Food in the Last Year: Never true  Transportation Needs: No Transportation Needs (08/06/2023)   PRAPARE - Administrator, Civil Service (Medical): No    Lack of Transportation (Non-Medical): No  Physical Activity: Insufficiently Active (08/06/2023)   Exercise Vital Sign    Days of Exercise per Week: 4 days    Minutes of Exercise per Session: 30 min  Stress: No Stress Concern Present (08/06/2023)   [employer] of Occupational Health - Occupational Stress Questionnaire    Feeling of Stress : Only a little  Social Connections: Socially Integrated (08/06/2023)  Social Advertising account executive [NHANES]    Frequency of Communication with Friends and Family: More than three times a week    Frequency of Social Gatherings with Friends and Family: More than three times a week    Attends Religious Services: More than 4 times per year    Active Member of [employer] or Organizations: Yes    Attends Engineer, structural: More than 4 times per year    Marital Status: Married  Catering manager Violence: Not At Risk (11/10/2022)   Humiliation, Afraid, Rape, and Kick questionnaire    Fear of Current or Ex-Partner: No    Emotionally Abused: No    Physically Abused: No    Sexually Abused: No    Family History  Problem Relation Age of Onset   Heart attack Mother    Alcohol abuse Mother    Mental illness Mother        died in her 68's of alzheimer's Dementia   Heart disease Mother 66   Heart attack Father    Heart disease Father 71       AMI - died @ 55.   Colon cancer Paternal Grandmother 56   Esophageal cancer Neg Hx    Stomach cancer Neg Hx    Colon polyps Neg Hx      Current Outpatient Medications:     ALPRAZolam (XANAX) 0.5 MG tablet, Take 1 tablet (0.5 mg total) by mouth 2 (two) times daily as needed. for anxiety, Disp: 60 tablet, Rfl: 5   busPIRone (BUSPAR) 10 MG tablet, Take 1 tablet (10 mg total) by mouth 3 (three) times daily., Disp: 90 tablet, Rfl: 2   DUPIXENT 300 MG/2ML SOPN, Every other Friday, Disp: , Rfl:    ezetimibe (ZETIA) 10 MG tablet, Take 1 tablet (10 mg total) by mouth daily., Disp: 90 tablet, Rfl: 3   glucose blood (ACCU-CHEK GUIDE) test strip, USE TO CHECK BLOOD SUGAR ONCE  DAILY, Disp: 100 strip, Rfl: 2   hyoscyamine (ANASPAZ) 0.125 MG TBDP disintergrating tablet, Place 1 tablet (0.125 mg total) under the tongue every 6 (six) hours as needed., Disp: 30 tablet, Rfl: 5   isosorbide mononitrate (IMDUR) 30 MG 24 hr tablet, TAKE 1 TABLET BY MOUTH DAILY, Disp: 90 tablet, Rfl: 3   levothyroxine (SYNTHROID) 100 MCG tablet, TAKE 1 TABLET BY MOUTH DAILY  BEFORE BREAKFAST, Disp: 100 tablet, Rfl: 2   meclizine (ANTIVERT) 25 MG tablet, TAKE 1 TABLET BY MOUTH THREE TIMES DAILY AS NEEDED FOR VERTIGO, Disp: 30 tablet, Rfl: 0   nitroGLYCERIN (NITROSTAT) 0.4 MG SL tablet, Place 1 tablet (0.4 mg total) under the tongue once as needed for up to 1 dose for chest pain. Or esophageal spasm, Disp: 30 tablet, Rfl: 0   omeprazole (PRILOSEC) 40 MG capsule, Take 1 capsule (40 mg total) by mouth 2 (two) times daily., Disp: 180 capsule, Rfl: 3   raloxifene (EVISTA) 60 MG tablet, Take 1 tablet (60 mg total) by mouth daily., Disp: 30 tablet, Rfl: 2   rosuvastatin (CRESTOR) 20 MG tablet, TAKE 1 TABLET BY MOUTH EVERY  OTHER DAY, Disp: 45 tablet, Rfl: 3   sertraline (ZOLOFT) 100 MG tablet, TAKE 2 TABLETS BY MOUTH DAILY, Disp: 180 tablet, Rfl: 3   SODIUM FLUORIDE, DENTAL RINSE, 0.2 % SOLN, Take by mouth as needed., Disp: , Rfl:  No current facility-administered medications for this visit.  Facility-Administered Medications Ordered in Other Visits:    clindamycin (CLEOCIN) IVPB 600 mg, 600 mg, Intravenous,  Once, Krasinski,  Caryn Bee, MD  Physical exam:  Vitals:   09/09/23 1449  BP: 109/68  Pulse: 72  Resp: 19  Temp: 98.7 F (37.1 C)  TempSrc: Tympanic  SpO2: 100%  Weight: 140 lb 4.8 oz (63.6 kg)  Height: 5' 5.5" (1.664 m)   Physical Exam Cardiovascular:     Rate and Rhythm: Normal rate and regular rhythm.     Heart sounds: Normal heart sounds.  Pulmonary:     Effort: Pulmonary effort is normal.     Breath sounds: Normal breath sounds.  Abdominal:     General: Bowel sounds are normal.     Palpations: Abdomen is soft.  Skin:    General: Skin is warm and dry.  Neurological:     Mental Status: She is alert and oriented to person, place, and time.      I have personally reviewed labs listed below:    Latest Ref Rng & Units 09/09/2023    2:10 PM  CMP  Glucose 70 - 99 mg/dL 92   BUN 8 - 23 mg/dL 21   Creatinine 1.61 - 1.00 mg/dL 0.96   Sodium 045 - 409 mmol/L 138   Potassium 3.5 - 5.1 mmol/L 3.7   Chloride 98 - 111 mmol/L 105   CO2 22 - 32 mmol/L 26   Calcium 8.9 - 10.3 mg/dL 9.0   Total Protein 6.5 - 8.1 g/dL 7.4   Total Bilirubin 0.0 - 1.2 mg/dL 1.1   Alkaline Phos 38 - 126 U/L 46   AST 15 - 41 U/L 22   ALT 0 - 44 U/L 18       Latest Ref Rng & Units 09/09/2023    2:10 PM  CBC  WBC 4.0 - 10.5 K/uL 6.6   Hemoglobin 12.0 - 15.0 g/dL 81.1   Hematocrit 91.4 - 46.0 % 33.9   Platelets 150 - 400 K/uL 151      Assessment and plan- Patient is a 83 y.o. female here for routine f/u of IgG kappa MGUS  Hemoglobin remains stable between 11-12. No evidence of hypercalcemia, AKI. No B symptoms. Free light chain ratio is normal. Myeloma panel is still pending. Overall MGUS is stable with no overt signs of progression. Cbc with diff, cmp, myeloma panel and flc in 1 year and I will see her thereafter   Visit Diagnosis 1. MGUS (monoclonal gammopathy of unknown significance)      Dr. Owens Shark, MD, MPH [facility] at [facility] [phone] 09/12/2023 5:53  PM

## 2023-09-13 LAB — MULTIPLE MYELOMA PANEL, SERUM
Albumin SerPl Elph-Mcnc: 3.9 g/dL (ref 2.9–4.4)
Albumin/Glob SerPl: 1.3 (ref 0.7–1.7)
Alpha 1: 0.3 g/dL (ref 0.0–0.4)
Alpha2 Glob SerPl Elph-Mcnc: 0.6 g/dL (ref 0.4–1.0)
B-Globulin SerPl Elph-Mcnc: 1.9 g/dL — ABNORMAL HIGH (ref 0.7–1.3)
Gamma Glob SerPl Elph-Mcnc: 0.4 g/dL (ref 0.4–1.8)
Globulin, Total: 3.2 g/dL (ref 2.2–3.9)
IgA: 203 mg/dL (ref 64–422)
IgG (Immunoglobin G), Serum: 1527 mg/dL (ref 586–1602)
IgM (Immunoglobulin M), Srm: 58 mg/dL (ref 26–217)
M Protein SerPl Elph-Mcnc: 1 g/dL — ABNORMAL HIGH
Total Protein ELP: 7.1 g/dL (ref 6.0–8.5)

## 2023-09-17 DIAGNOSIS — D485 Neoplasm of uncertain behavior of skin: Secondary | ICD-10-CM | POA: Diagnosis not present

## 2023-09-17 DIAGNOSIS — D2262 Melanocytic nevi of left upper limb, including shoulder: Secondary | ICD-10-CM | POA: Diagnosis not present

## 2023-09-17 DIAGNOSIS — D2272 Melanocytic nevi of left lower limb, including hip: Secondary | ICD-10-CM | POA: Diagnosis not present

## 2023-09-17 DIAGNOSIS — L570 Actinic keratosis: Secondary | ICD-10-CM | POA: Diagnosis not present

## 2023-09-17 DIAGNOSIS — C44729 Squamous cell carcinoma of skin of left lower limb, including hip: Secondary | ICD-10-CM | POA: Diagnosis not present

## 2023-09-17 DIAGNOSIS — D2261 Melanocytic nevi of right upper limb, including shoulder: Secondary | ICD-10-CM | POA: Diagnosis not present

## 2023-09-17 DIAGNOSIS — Z85828 Personal history of other malignant neoplasm of skin: Secondary | ICD-10-CM | POA: Diagnosis not present

## 2023-09-17 DIAGNOSIS — D225 Melanocytic nevi of trunk: Secondary | ICD-10-CM | POA: Diagnosis not present

## 2023-10-17 ENCOUNTER — Other Ambulatory Visit: Payer: Self-pay | Admitting: Cardiovascular Disease

## 2023-10-26 DIAGNOSIS — C44729 Squamous cell carcinoma of skin of left lower limb, including hip: Secondary | ICD-10-CM | POA: Diagnosis not present

## 2023-10-31 ENCOUNTER — Other Ambulatory Visit: Payer: Self-pay | Admitting: Internal Medicine

## 2023-11-05 ENCOUNTER — Other Ambulatory Visit: Payer: Self-pay | Admitting: Internal Medicine

## 2023-11-07 ENCOUNTER — Other Ambulatory Visit: Payer: Self-pay | Admitting: Internal Medicine

## 2023-11-09 ENCOUNTER — Other Ambulatory Visit: Payer: Self-pay | Admitting: Internal Medicine

## 2023-11-10 ENCOUNTER — Encounter: Payer: Self-pay | Admitting: Internal Medicine

## 2023-11-10 ENCOUNTER — Ambulatory Visit (INDEPENDENT_AMBULATORY_CARE_PROVIDER_SITE_OTHER): Admitting: Internal Medicine

## 2023-11-10 VITALS — BP 144/66 | HR 73 | Ht 65.5 in | Wt 144.0 lb

## 2023-11-10 DIAGNOSIS — F418 Other specified anxiety disorders: Secondary | ICD-10-CM

## 2023-11-10 DIAGNOSIS — E559 Vitamin D deficiency, unspecified: Secondary | ICD-10-CM | POA: Diagnosis not present

## 2023-11-10 DIAGNOSIS — Z9181 History of falling: Secondary | ICD-10-CM | POA: Diagnosis not present

## 2023-11-10 DIAGNOSIS — E538 Deficiency of other specified B group vitamins: Secondary | ICD-10-CM | POA: Diagnosis not present

## 2023-11-10 DIAGNOSIS — D649 Anemia, unspecified: Secondary | ICD-10-CM

## 2023-11-10 DIAGNOSIS — R2689 Other abnormalities of gait and mobility: Secondary | ICD-10-CM | POA: Diagnosis not present

## 2023-11-10 DIAGNOSIS — E1142 Type 2 diabetes mellitus with diabetic polyneuropathy: Secondary | ICD-10-CM

## 2023-11-10 DIAGNOSIS — R252 Cramp and spasm: Secondary | ICD-10-CM

## 2023-11-10 DIAGNOSIS — E785 Hyperlipidemia, unspecified: Secondary | ICD-10-CM

## 2023-11-10 DIAGNOSIS — E032 Hypothyroidism due to medicaments and other exogenous substances: Secondary | ICD-10-CM

## 2023-11-10 DIAGNOSIS — E1169 Type 2 diabetes mellitus with other specified complication: Secondary | ICD-10-CM | POA: Diagnosis not present

## 2023-11-10 DIAGNOSIS — D472 Monoclonal gammopathy: Secondary | ICD-10-CM | POA: Diagnosis not present

## 2023-11-10 DIAGNOSIS — F39 Unspecified mood [affective] disorder: Secondary | ICD-10-CM

## 2023-11-10 NOTE — Progress Notes (Addendum)
 Subjective:  Patient ID: Megan Bradley, female    DOB: Sep 07, 1940  Age: 83 y.o. MRN: [id number]  CC: The primary encounter diagnosis was Type 2 DM with diabetic neuropathy affecting both sides of body (HCC). Diagnoses of Hyperlipidemia associated with type 2 diabetes mellitus (HCC), B12 deficiency, Vitamin D  deficiency, Muscle cramps, Mood disorder (HCC), Anemia, unspecified type, Anxiety with obsessional features, Balance problem, History of recent fall, Iatrogenic hypothyroidism, and MGUS (monoclonal gammopathy of unknown significance) were also pertinent to this visit.   HPI JILLIANN SUBRAMANIAN presents for  Chief Complaint  Patient presents with   Medical Management of Chronic Issues    3 month follow up    :  1) T2DM: has been restricting her diet.  Average CBG is 114 using fastings and 3 hr post prandials.  She continues to report that her balance is poor and had a recent fall .  Has not had PT in quite a while but defers, does not think it helped.   still doing  the exercises  (standing) at home   2) recent fall with blunt trauma to right side of face. Occurred in the bathroom while sitting on the commode to work out a leg cramp. Was not urinating  When she stood up she lost her balance .  May have lost  consciousness  for a few seconds but not sure.   Right forehead and temple had a large hematoma, bruised .Aaron Aas Did not go to ER    3)  still choking periodically during eating due to recurrent esophageal spasm and stenosis.  Has not seen [facility]  using hyoscyamine   4) Low back pain:  getting ESI last one was 2 months ago  done by [facility]  MD  Christy (after MRI of lumbar spine was done  and noted compression of sciatic nerve on the left ).  She is travelling to [place] on July 15   using advil once or twice a week    5) HLD;  taking zetia  and crestor .  T  6) elevated BP reading today :   checks blood pressure twice weekly at home.   Patient is following a reduced salt diet most days and is  taking medications as prescribed .  home readings   115 to 150/ < 80    7) Mood disorder  lately she has felt dissatisfied and alone in her marriage. Wants to see a therapist   Outpatient Medications Prior to Visit  Medication Sig Dispense Refill   ACCU-CHEK GUIDE TEST test strip USE TO CHECK BLOOD SUGAR ONCE  DAILY 100 strip 2   ALPRAZolam  (XANAX ) 0.5 MG tablet Take 1 tablet (0.5 mg total) by mouth 2 (two) times daily as needed. for anxiety 60 tablet 5   busPIRone  (BUSPAR ) 10 MG tablet Take 1 tablet (10 mg total) by mouth 3 (three) times daily. 90 tablet 2   DUPIXENT 300 MG/2ML SOPN Every other Friday     ezetimibe  (ZETIA ) 10 MG tablet Take 1 tablet (10 mg total) by mouth daily. 90 tablet 3   hyoscyamine  (ANASPAZ ) 0.125 MG TBDP disintergrating tablet PLACE ONE TABLET UNDER THE TONGUE EVERY SIX HOURS AS NEEDED 30 tablet 0   isosorbide  mononitrate (IMDUR ) 30 MG 24 hr tablet TAKE 1 TABLET BY MOUTH DAILY 100 tablet 2   levothyroxine  (SYNTHROID ) 100 MCG tablet TAKE 1 TABLET BY MOUTH DAILY  BEFORE BREAKFAST 100 tablet 2   meclizine  (ANTIVERT ) 25 MG tablet TAKE 1 TABLET BY MOUTH THREE  TIMES DAILY AS NEEDED FOR VERTIGO 30 tablet 0   nitroGLYCERIN  (NITROSTAT ) 0.4 MG SL tablet Place 1 tablet (0.4 mg total) under the tongue once as needed for up to 1 dose for chest pain. Or esophageal spasm (Patient not taking: Reported on 11/11/2023) 30 tablet 0   omeprazole  (PRILOSEC) 40 MG capsule Take 1 capsule (40 mg total) by mouth 2 (two) times daily. 180 capsule 3   raloxifene  (EVISTA ) 60 MG tablet Take 1 tablet by mouth once daily 90 tablet 0   rosuvastatin  (CRESTOR ) 20 MG tablet TAKE 1 TABLET BY MOUTH EVERY  OTHER DAY 50 tablet 2   sertraline  (ZOLOFT ) 100 MG tablet TAKE 2 TABLETS BY MOUTH DAILY 180 tablet 3   SODIUM FLUORIDE, DENTAL RINSE, 0.2 % SOLN Take by mouth as needed.     Facility-Administered Medications Prior to Visit  Medication Dose Route Frequency Provider Last Rate Last Admin   clindamycin   (CLEOCIN ) IVPB 600 mg  600 mg Intravenous Once Krasinski, Kevin, MD        Review of Systems;  Patient denies headache, fevers, malaise, unintentional weight loss, skin rash, eye pain, sinus congestion and sinus pain, sore throat, dysphagia,  hemoptysis , cough, dyspnea, wheezing, chest pain, palpitations, orthopnea, edema, abdominal pain, nausea, melena, diarrhea, constipation, flank pain, dysuria, hematuria, urinary  Frequency, nocturia, numbness, tingling, seizures,  Focal weakness, Loss of consciousness,  Tremor, insomnia, depression, anxiety, and suicidal ideation.      Objective:  BP (!) 144/66   Pulse 73   Ht 5' 5.5" (1.664 m)   Wt 144 lb (65.3 kg)   SpO2 99%   BMI 23.60 kg/m   BP Readings from Last 3 Encounters:  11/10/23 (!) 144/66  09/09/23 109/68  08/10/23 (!) 140/72    Wt Readings from Last 3 Encounters:  11/11/23 140 lb (63.5 kg)  11/10/23 144 lb (65.3 kg)  09/09/23 140 lb 4.8 oz (63.6 kg)    Physical Exam  Lab Results  Component Value Date   HGBA1C 6.9 (H) 11/10/2023   HGBA1C 6.9 (H) 08/10/2023   HGBA1C 6.4 (A) 05/11/2023    Lab Results  Component Value Date   CREATININE 0.93 11/10/2023   CREATININE 0.98 09/09/2023   CREATININE 0.85 08/10/2023    Lab Results  Component Value Date   WBC 6.6 09/09/2023   HGB 11.2 (L) 09/09/2023   HCT 33.9 (L) 09/09/2023   PLT 151 09/09/2023   GLUCOSE 82 11/10/2023   CHOL 125 11/10/2023   TRIG 49.0 11/10/2023   HDL 75.80 11/10/2023   LDLDIRECT 31.0 11/10/2023   LDLCALC 39 11/10/2023   ALT 12 11/10/2023   AST 17 11/10/2023   NA 140 11/10/2023   K 4.5 11/10/2023   CL 104 11/10/2023   CREATININE 0.93 11/10/2023   BUN 16 11/10/2023   CO2 29 11/10/2023   TSH 2.03 05/13/2023   INR 1.0 09/26/2020   HGBA1C 6.9 (H) 11/10/2023   MICROALBUR 1.6 08/10/2023    No results found.  Assessment & Plan:  .Type 2 DM with diabetic neuropathy affecting both sides of body (HCC) Assessment & Plan:  Currently  well-controlled on current medications . Aaron Aas Patient is reminded to schedule an annual eye exam and foot exam is normal today. Patient has no microalbuminuria. Patient is tolerating statin therapy for CAD risk reduction and on ACE/ARB for renal protection and hypertension     Lab Results  Component Value Date   HGBA1C 6.9 (H) 11/10/2023   Lab Results  Component Value  Date   MICROALBUR 1.6 08/10/2023   MICROALBUR 1.5 08/07/2022   Lab Results  Component Value Date   CHOL 125 11/10/2023   HDL 75.80 11/10/2023   LDLCALC 39 11/10/2023   LDLDIRECT 31.0 11/10/2023   TRIG 49.0 11/10/2023   CHOLHDL 2 11/10/2023    Orders: -     Comprehensive metabolic panel with GFR -     Hemoglobin A1c  Hyperlipidemia associated with type 2 diabetes mellitus (HCC) Assessment & Plan: She is at goal with daily Zetia  and QOD rosuvastatin  per patient preference.   Trial  of Januvia advised  Lab Results  Component Value Date   HGBA1C 6.9 (H) 11/10/2023    Lab Results  Component Value Date   MICROALBUR 1.6 08/10/2023   MICROALBUR 1.5 08/07/2022       Lab Results  Component Value Date   CHOL 125 11/10/2023   HDL 75.80 11/10/2023   LDLCALC 39 11/10/2023   LDLDIRECT 31.0 11/10/2023   TRIG 49.0 11/10/2023   CHOLHDL 2 11/10/2023   Lab Results  Component Value Date   ALT 12 11/10/2023   AST 17 11/10/2023   ALKPHOS 44 11/10/2023   BILITOT 0.7 11/10/2023     Orders: -     Lipid panel -     LDL cholesterol, direct  B12 deficiency -     B12 and Folate Panel  Vitamin D  deficiency -     VITAMIN D  25 Hydroxy (Vit-D Deficiency, Fractures)  Muscle cramps -     Magnesium  Mood disorder (HCC) -     Ambulatory referral to Psychology  Anemia, unspecified type Assessment & Plan: Mild, stable  iron stores are normal.  Has treated b12 deficiency and  MGUS  under surveillance by Dr Randy Buttery  Lab Results  Component Value Date   WBC 6.6 09/09/2023   HGB 11.2 (L) 09/09/2023   HCT 33.9 (L)  09/09/2023   MCV 93.9 09/09/2023   PLT 151 09/09/2023     Lab Results  Component Value Date   VITAMINB12 392 09/09/2023   Lab Results  Component Value Date   IRON 140 09/09/2023   TIBC 358 09/09/2023   FERRITIN 244 09/09/2023   Worsening by repeat labs.    Anxiety with obsessional features Assessment & Plan: She has been more irritable for the last several months and has grown intolerant of her husband's routine and lack of emotional support  Referral for counselling has been repeatedly deferred but now accepted.  . The risks and benefits of  Chronic  benzodiazepine use were discussed with patient today including increased risk of dementia,  Addiction, and seizures if abruptly withdrawn  .  Patient was encouraged to reduce use of  alprazolam  to once daily and use Buspar  during the day  instead  .     Balance problem Assessment & Plan: Multiple etiologies likely to blame including proximal muscle weakness and BPV .  She has deferrred PT due to lack of appreciable improvement in the past.    History of recent fall Assessment & Plan: Recent fall occurred due to loss of balance and proximal leg weakness.   PT referral deferred   Iatrogenic hypothyroidism Assessment & Plan: Her dose was rediced from 112 TO 100 MCG OF LEVOTHYROXINE  DAILY  in late August.   Lab Results  Component Value Date   TSH 2.03 05/13/2023         MGUS (monoclonal gammopathy of unknown significance) Assessment & Plan: No progression  thus far .  hgb is stable  Lab Results  Component Value Date   WBC 6.6 09/09/2023   HGB 11.2 (L) 09/09/2023   HCT 33.9 (L) 09/09/2023   MCV 93.9 09/09/2023   PLT 151 09/09/2023         I spent 34 minutes on the day of this face to face encounter reviewing patient's  most recent visit with oncology ,   prior relevant surgical and non surgical procedures, recent  labs and imaging studies, counseling on fall prevention and glycemic ,  reviewing the assessment  and plan with patient, and post visit ordering and reviewing of  diagnostics and therapeutics with patient  .   Follow-up: Return in about 3 months (around 02/09/2024) for follow up diabetes.   Thersia Flax, MD

## 2023-11-10 NOTE — Patient Instructions (Addendum)
 For your back pain:  I advise you to take  2000 mg of acetominophen (tylenol ) every day In divided doses (1000 mg every 12 hours.)  to minimze use of ibuprofen     Blood sugars are informative if you check them 2 hours after you eat. Not 3 hrs   I have made a referral to psychology for talk therapy

## 2023-11-11 ENCOUNTER — Ambulatory Visit: Payer: Medicare Other | Admitting: *Deleted

## 2023-11-11 VITALS — Ht 65.5 in | Wt 140.0 lb

## 2023-11-11 DIAGNOSIS — Z0000 Encounter for general adult medical examination without abnormal findings: Secondary | ICD-10-CM

## 2023-11-11 LAB — LIPID PANEL
Cholesterol: 125 mg/dL (ref 0–200)
HDL: 75.8 mg/dL (ref >39.00)
LDL Cholesterol: 39 mg/dL (ref 0–99)
NonHDL: 49.27
Total CHOL/HDL Ratio: 2
Triglycerides: 49 mg/dL (ref 0.0–149.0)
VLDL: 9.8 mg/dL (ref 0.0–40.0)

## 2023-11-11 LAB — COMPREHENSIVE METABOLIC PANEL WITH GFR
ALT: 12 U/L (ref 0–35)
AST: 17 U/L (ref 0–37)
Albumin: 4.1 g/dL (ref 3.5–5.2)
Alkaline Phosphatase: 44 U/L (ref 39–117)
BUN: 16 mg/dL (ref 6–23)
CO2: 29 meq/L (ref 19–32)
Calcium: 9.5 mg/dL (ref 8.4–10.5)
Chloride: 104 meq/L (ref 96–112)
Creatinine, Ser: 0.93 mg/dL (ref 0.40–1.20)
GFR: 57.72 mL/min — ABNORMAL LOW (ref >60.00)
Glucose, Bld: 82 mg/dL (ref 70–99)
Potassium: 4.5 meq/L (ref 3.5–5.1)
Sodium: 140 meq/L (ref 135–145)
Total Bilirubin: 0.7 mg/dL (ref 0.2–1.2)
Total Protein: 7.4 g/dL (ref 6.0–8.3)

## 2023-11-11 LAB — MAGNESIUM: Magnesium: 2.1 mg/dL (ref 1.5–2.5)

## 2023-11-11 LAB — HEMOGLOBIN A1C: Hgb A1c MFr Bld: 6.9 % — ABNORMAL HIGH (ref 4.6–6.5)

## 2023-11-11 LAB — LDL CHOLESTEROL, DIRECT: Direct LDL: 31 mg/dL

## 2023-11-11 LAB — B12 AND FOLATE PANEL
Folate: 8.7 ng/mL (ref >5.9)
Vitamin B-12: 471 pg/mL (ref 211–911)

## 2023-11-11 LAB — VITAMIN D 25 HYDROXY (VIT D DEFICIENCY, FRACTURES): VITD: 21.63 ng/mL — ABNORMAL LOW (ref 30.00–100.00)

## 2023-11-11 NOTE — Assessment & Plan Note (Addendum)
 She is at goal with daily Zetia  and QOD rosuvastatin  per patient preference.   Trial  of Januvia advised  Lab Results  Component Value Date   HGBA1C 6.9 (H) 11/10/2023    Lab Results  Component Value Date   MICROALBUR 1.6 08/10/2023   MICROALBUR 1.5 08/07/2022       Lab Results  Component Value Date   CHOL 125 11/10/2023   HDL 75.80 11/10/2023   LDLCALC 39 11/10/2023   LDLDIRECT 31.0 11/10/2023   TRIG 49.0 11/10/2023   CHOLHDL 2 11/10/2023   Lab Results  Component Value Date   ALT 12 11/10/2023   AST 17 11/10/2023   ALKPHOS 44 11/10/2023   BILITOT 0.7 11/10/2023

## 2023-11-11 NOTE — Assessment & Plan Note (Signed)
 Mild, stable  iron stores are normal.  Has treated b12 deficiency and  MGUS  under surveillance by Dr Randy Buttery  Lab Results  Component Value Date   WBC 6.6 09/09/2023   HGB 11.2 (L) 09/09/2023   HCT 33.9 (L) 09/09/2023   MCV 93.9 09/09/2023   PLT 151 09/09/2023     Lab Results  Component Value Date   VITAMINB12 392 09/09/2023   Lab Results  Component Value Date   IRON 140 09/09/2023   TIBC 358 09/09/2023   FERRITIN 244 09/09/2023   Worsening by repeat labs.

## 2023-11-11 NOTE — Progress Notes (Signed)
 Subjective:   Megan Bradley is a 83 y.o. who presents for a Medicare Wellness preventive visit.  As a reminder, Annual Wellness Visits don't include a physical exam, and some assessments may be limited, especially if this visit is performed virtually. We may recommend an in-person follow-up visit with your provider if needed.  Visit Complete: Virtual I connected with  Megan Bradley on 11/11/23 by a audio enabled telemedicine application and verified that I am speaking with the correct person using two identifiers.  Patient Location: Home  Provider Location: Home Office  I discussed the limitations of evaluation and management by telemedicine. The patient expressed understanding and agreed to proceed.  Vital Signs: Because this visit was a virtual/telehealth visit, some criteria may be missing or patient reported. Any vitals not documented were not able to be obtained and vitals that have been documented are patient reported.  VideoDeclined- This patient declined Librarian, academic. Therefore the visit was completed with audio only.  Persons Participating in Visit: Patient.  AWV Questionnaire: No: Patient Medicare AWV questionnaire was not completed prior to this visit.  Cardiac Risk Factors include: advanced age (>2men, >58 women);diabetes mellitus;dyslipidemia;Other (see comment), Risk factor comments: RBBB     Objective:     Today's Vitals   11/11/23 1058  Weight: 140 lb (63.5 kg)  Height: 5' 5.5" (1.664 m)   Body mass index is 22.94 kg/m.     11/11/2023   11:14 AM 09/09/2023    2:44 PM 11/10/2022   11:08 AM 06/25/2022    8:11 AM 10/24/2021    9:16 AM 09/03/2021    1:21 PM 03/01/2021    2:01 PM  Advanced Directives  Does Patient Have a Medical Advance Directive? Yes No No No Yes Yes Yes  Type of Estate agent of [place];Living will    Healthcare Power of [place];Living will Healthcare Power of [place];Living will Healthcare  Power of [place];Living will  Does patient want to make changes to medical advance directive?     No - Patient declined  No - Patient declined  Copy of Healthcare Power of Attorney in Chart? No - copy requested    No - copy requested    Would patient like information on creating a medical advance directive?  No - Patient declined No - Patient declined        Current Medications (verified) Outpatient Encounter Medications as of 11/11/2023  Medication Sig   ACCU-CHEK GUIDE TEST test strip USE TO CHECK BLOOD SUGAR ONCE  DAILY   ALPRAZolam  (XANAX ) 0.5 MG tablet Take 1 tablet (0.5 mg total) by mouth 2 (two) times daily as needed. for anxiety   busPIRone  (BUSPAR ) 10 MG tablet Take 1 tablet (10 mg total) by mouth 3 (three) times daily.   DUPIXENT 300 MG/2ML SOPN Every other Friday   ezetimibe  (ZETIA ) 10 MG tablet Take 1 tablet (10 mg total) by mouth daily.   hyoscyamine  (ANASPAZ ) 0.125 MG TBDP disintergrating tablet PLACE ONE TABLET UNDER THE TONGUE EVERY SIX HOURS AS NEEDED   isosorbide  mononitrate (IMDUR ) 30 MG 24 hr tablet TAKE 1 TABLET BY MOUTH DAILY   levothyroxine  (SYNTHROID ) 100 MCG tablet TAKE 1 TABLET BY MOUTH DAILY  BEFORE BREAKFAST   meclizine  (ANTIVERT ) 25 MG tablet TAKE 1 TABLET BY MOUTH THREE TIMES DAILY AS NEEDED FOR VERTIGO   omeprazole  (PRILOSEC) 40 MG capsule Take 1 capsule (40 mg total) by mouth 2 (two) times daily.   raloxifene  (EVISTA ) 60 MG tablet Take  1 tablet by mouth once daily   rosuvastatin  (CRESTOR ) 20 MG tablet TAKE 1 TABLET BY MOUTH EVERY  OTHER DAY   sertraline  (ZOLOFT ) 100 MG tablet TAKE 2 TABLETS BY MOUTH DAILY   nitroGLYCERIN  (NITROSTAT ) 0.4 MG SL tablet Place 1 tablet (0.4 mg total) under the tongue once as needed for up to 1 dose for chest pain. Or esophageal spasm (Patient not taking: Reported on 11/11/2023)   Facility-Administered Encounter Medications as of 11/11/2023  Medication   clindamycin  (CLEOCIN ) IVPB 600 mg    Allergies (verified) Atorvastatin,  Codeine, and Fentanyl    History: Past Medical History:  Diagnosis Date   Anxiety    Anxiety disorder    Aortic atherosclerosis (HCC)    Arthritis    both knees   Breast disorder in female 10/14/2019   Cervical dysplasia    a. 2010 - high grade squamous intraepithelial lesion s/p excision.   Chest pain    a. 2006 or 2007 [facility]): reportedly nl cors;  b. 09/2011 ETT: nl.   DOE (dyspnea on exertion)    Family history of adverse reaction to anesthesia    grand daughter was hard to wake up after back surgery   GERD (gastroesophageal reflux disease)    Grade II diastolic dysfunction    Herpes zoster    Hypothyroidism    Mild aortic regurgitation    Nutcracker esophagus    Orthostatic hypotension    Pneumonia    Pre-diabetes    Ruptured left breast implant, initial encounter 07/05/2018   Sleep apnea    does not use cpap, did not tolerate   Vertigo    Wears dentures    partial lower   Past Surgical History:  Procedure Laterality Date   AUGMENTATION MAMMAPLASTY Bilateral    BREAST EXCISIONAL BIOPSY Left    CARDIAC CATHETERIZATION  2003   Dr.Callwood, R/L heart cath,   COLONOSCOPY WITH PROPOFOL  N/A 06/12/2017   Procedure: COLONOSCOPY WITH PROPOFOL ;  Surgeon: Marnee Sink, MD;  Location: [facility];  Service: Endoscopy;  Laterality: N/A;   dental work     ESOPHAGEAL MANOMETRY N/A 06/19/2016   Procedure: ESOPHAGEAL MANOMETRY (EM);  Surgeon: Marnee Sink, MD;  Location: [facility] ENDOSCOPY;  Service: Endoscopy;  Laterality: N/A;   ESOPHAGOGASTRODUODENOSCOPY (EGD) WITH PROPOFOL  N/A 04/03/2017   Procedure: ESOPHAGOGASTRODUODENOSCOPY (EGD) WITH PROPOFOL ;  Surgeon: Marnee Sink, MD;  Location: [facility];  Service: Endoscopy;  Laterality: N/A;  diabetic - diet controlled   THYROIDECTOMY  05/15/15   [facility]   TONSILLECTOMY     TOTAL KNEE ARTHROPLASTY Right 10/25/2020   Procedure: RIGHT TOTAL KNEE ARTHROPLASTY;  Surgeon: Rande Bushy, MD;  Location: [facility] ORS;  Service:  Orthopedics;  Laterality: Right;   Family History  Problem Relation Age of Onset   Heart attack Mother    Alcohol abuse Mother    Mental illness Mother        died in her 29's of alzheimer's Dementia   Heart disease Mother 86   Heart attack Father    Heart disease Father 62       AMI - died @ 8.   Colon cancer Paternal Grandmother 13   Esophageal cancer Neg Hx    Stomach cancer Neg Hx    Colon polyps Neg Hx    Social History   Socioeconomic History   Marital status: Married    Spouse name: Mara [place]   Number of children: 1   Years of education: Not on file   Highest education level: Bachelor's  degree (e.g., BA, AB, BS)  Occupational History   Not on file  Tobacco Use   Smoking status: Never   Smokeless tobacco: Never  Vaping Use   Vaping status: Never Used  Substance and Sexual Activity   Alcohol use: Yes    Comment: wine once a week   Drug use: No   Sexual activity: Never  Other Topics Concern   Not on file  Social History Narrative   Lives in [place].  Active but doesn't exercise. Retired from [employer].   Social Drivers of Corporate investment banker Strain: Low Risk  (11/11/2023)   Overall Financial Resource Strain (CARDIA)    Difficulty of Paying Living Expenses: Not hard at all  Food Insecurity: No Food Insecurity (11/11/2023)   Hunger Vital Sign    Worried About Running Out of Food in the Last Year: Never true    Ran Out of Food in the Last Year: Never true  Transportation Needs: No Transportation Needs (11/11/2023)   PRAPARE - Administrator, Civil Service (Medical): No    Lack of Transportation (Non-Medical): No  Physical Activity: Insufficiently Active (11/11/2023)   Exercise Vital Sign    Days of Exercise per Week: 1 day    Minutes of Exercise per Session: 20 min  Stress: Stress Concern Present (11/11/2023)   [employer] of Occupational Health - Occupational Stress Questionnaire    Feeling of Stress : To some extent  Social Connections:  Socially Integrated (11/11/2023)   Social Connection and Isolation Panel [NHANES]    Frequency of Communication with Friends and Family: More than three times a week    Frequency of Social Gatherings with Friends and Family: More than three times a week    Attends Religious Services: More than 4 times per year    Active Member of [employer] or Organizations: Yes    Attends Engineer, structural: More than 4 times per year    Marital Status: Married    Tobacco Counseling Counseling given: Not Answered    Clinical Intake:  Pre-visit preparation completed: Yes  Pain : No/denies pain     BMI - recorded: 22.94 Nutritional Status: BMI of 19-24  Normal Nutritional Risks: None Diabetes: Yes CBG done?: No Did pt. bring in CBG monitor from home?: No  Lab Results  Component Value Date   HGBA1C 6.9 (H) 08/10/2023   HGBA1C 6.4 (A) 05/11/2023   HGBA1C 6.9 (H) 02/04/2023     How often do you need to have someone help you when you read instructions, pamphlets, or other written materials from your doctor or pharmacy?: 1 - Never  Interpreter Needed?: No  Information entered by :: R. Chayim Bialas LPN   Activities of Daily Living     11/11/2023   11:01 AM  In your present state of health, do you have any difficulty performing the following activities:  Hearing? 0  Vision? 0  Comment glasses  Difficulty concentrating or making decisions? 0  Walking or climbing stairs? 1  Dressing or bathing? 0  Doing errands, shopping? 0  Preparing Food and eating ? N  Using the Toilet? N  In the past six months, have you accidently leaked urine? N  Do you have problems with loss of bowel control? N  Managing your Medications? N  Managing your Finances? N  Housekeeping or managing your Housekeeping? N    Patient Care Team: Thersia Flax, MD as PCP - General (Internal Medicine) Gollan, Timothy J, MD as  PCP - Cardiology (Cardiology) Avonne Boettcher, MD as Consulting Physician (Oncology)  I have  updated your Care Teams any recent Medical Services you may have received from other providers in the past year.     Assessment:    This is a routine wellness examination for [place].  Hearing/Vision screen Hearing Screening - Comments:: No issues Vision Screening - Comments:: glasses   Goals Addressed             This Visit's Progress    Patient Stated       Wants to get on a regular walking program       Depression Screen     11/11/2023   11:10 AM 11/10/2023    3:15 PM 08/10/2023    3:45 PM 05/11/2023    3:47 PM 03/03/2023    3:00 PM 02/04/2023    1:40 PM 11/10/2022   11:05 AM  PHQ 2/9 Scores  PHQ - 2 Score 2 2 0 4 1 2  0  PHQ- 9 Score 4 4 1 10 2 5  0    Fall Risk     11/11/2023   11:03 AM 11/10/2023    3:15 PM 08/10/2023    3:45 PM 05/11/2023    3:47 PM 03/03/2023    3:00 PM  Fall Risk   Falls in the past year? 1 1 1  0 0  Number falls in past yr: 1 1 0 0 0  Injury with Fall? 1 1 0 0 0  Risk for fall due to : History of fall(s);Impaired balance/gait History of fall(s) History of fall(s) No Fall Risks No Fall Risks  Follow up Falls evaluation completed;Falls prevention discussed Falls evaluation completed Falls evaluation completed Falls evaluation completed Falls evaluation completed    MEDICARE RISK AT HOME:  Medicare Risk at Home Any stairs in or around the home?: Yes If so, are there any without handrails?: No Home free of loose throw rugs in walkways, pet beds, electrical cords, etc?: Yes Adequate lighting in your home to reduce risk of falls?: Yes Life alert?: Yes Use of a cane, walker or w/c?: No Grab bars in the bathroom?: No Shower chair or bench in shower?: No Elevated toilet seat or a handicapped toilet?: Yes  TIMED UP AND GO:  Was the test performed?  No  Cognitive Function: 6CIT completed    04/18/2015    3:57 PM  MMSE - Mini Mental State Exam  Orientation to time 5  Orientation to Place 5  Registration 3  Attention/ Calculation 5  Recall 3   Language- name 2 objects 2  Language- repeat 1  Language- follow 3 step command 3  Language- read & follow direction 1  Write a sentence 1  Copy design 1  Total score 30        11/11/2023   11:14 AM 11/10/2022   11:16 AM 08/17/2019    9:56 AM 06/18/2018    3:03 PM 06/17/2017    3:03 PM  6CIT Screen  What Year? 0 points 0 points 0 points 0 points 0 points  What month? 0 points 0 points 0 points 0 points 0 points  What time? 0 points 0 points 0 points 0 points 0 points  Count back from 20 0 points 0 points  0 points 0 points  Months in reverse 0 points 0 points  0 points 0 points  Repeat phrase 0 points 0 points  0 points 0 points  Total Score 0 points 0 points  0 points  0 points    Immunizations Immunization History  Administered Date(s) Administered   DTaP 12/15/2011   Fluad Quad(high Dose 65+) 02/25/2019, 03/04/2021, 03/28/2022   Influenza Split 04/14/2011, 03/02/2012   Influenza, High Dose Seasonal PF 02/18/2016, 03/23/2017, 03/29/2018   Influenza,inj,Quad PF,6+ Mos 03/30/2013, 03/04/2014, 03/02/2015   Influenza-Unspecified 03/28/2015, 03/06/2020, 03/12/2020, 04/19/2023   PFIZER(Purple Top)SARS-COV-2 Vaccination 06/13/2019, 07/04/2019, 04/02/2020, 12/24/2020   PNEUMOCOCCAL CONJUGATE-20 03/28/2022   Pfizer Covid-19 Vaccine Bivalent Booster 77yrs & up 04/13/2021   Pfizer(Comirnaty)Fall Seasonal Vaccine 12 years and older 07/16/2022   Pneumococcal Conjugate-13 06/07/2014   Pneumococcal Polysaccharide-23 06/07/2008   Respiratory Syncytial Virus Vaccine,Recomb Aduvanted(Arexvy) 05/15/2022   Tdap 06/16/2022    Screening Tests Health Maintenance  Topic Date Due   DEXA SCAN  03/11/2020   Medicare Annual Wellness (AWV)  11/10/2023   COVID-19 Vaccine (7 - 2024-25 season) 11/26/2023 (Originally 02/08/2023)   INFLUENZA VACCINE  01/08/2024   FOOT EXAM  02/04/2024   HEMOGLOBIN A1C  02/10/2024   OPHTHALMOLOGY EXAM  04/13/2024   Diabetic kidney evaluation - Urine ACR  08/09/2024    Diabetic kidney evaluation - eGFR measurement  09/08/2024   DTaP/Tdap/Td (3 - Td or Tdap) 06/16/2032   Pneumonia Vaccine 74+ Years old  Completed   HPV VACCINES  Aged Out   Meningococcal B Vaccine  Aged Out   Colonoscopy  Discontinued   Hepatitis C Screening  Discontinued   Zoster Vaccines- Shingrix  Discontinued    Health Maintenance  Health Maintenance Due  Topic Date Due   DEXA SCAN  03/11/2020   Medicare Annual Wellness (AWV)  11/10/2023   Health Maintenance Items Addressed: Patient declines shingles vaccine. Dexa scheduled for 03/2024  Additional Screening:  Vision Screening: Recommended annual ophthalmology exams for early detection of glaucoma and other disorders of the eye.Up to date [facility] Would you like a referral to an eye doctor? No    Dental Screening: Recommended annual dental exams for proper oral hygiene  Community Resource Referral / Chronic Care Management: CRR required this visit?  No   CCM required this visit?  No   Plan:    I have personally reviewed and noted the following in the patient's chart:   Medical and social history Use of alcohol, tobacco or illicit drugs  Current medications and supplements including opioid prescriptions. Patient is not currently taking opioid prescriptions. Functional ability and status Nutritional status Physical activity Advanced directives List of other physicians Hospitalizations, surgeries, and ER visits in previous 12 months Vitals Screenings to include cognitive, depression, and falls Referrals and appointments  In addition, I have reviewed and discussed with patient certain preventive protocols, quality metrics, and best practice recommendations. A written personalized care plan for preventive services as well as general preventive health recommendations were provided to patient.   Felicitas Horse, LPN   09/13/9627   After Visit Summary: (MyChart) Due to this being a telephonic visit, the after visit  summary with patients personalized plan was offered to patient via MyChart   Notes: Nothing significant to report at this time.

## 2023-11-11 NOTE — Patient Instructions (Signed)
 Megan Bradley , Thank you for taking time out of your busy schedule to complete your Annual Wellness Visit with me. I enjoyed our conversation and look forward to speaking with you again next year. I, as well as your care team,  appreciate your ongoing commitment to your health goals. Please review the following plan we discussed and let me know if I can assist you in the future. Your Game plan/ To Do List    Referrals: If you haven't heard from the office you've been referred to, please reach out to them at the phone provided.  Consider updating your shingles vaccines. Follow up Visits: Next Medicare AWV with our clinical staff: 11/14/24 @ 11:30   Have you seen your provider in the last 6 months (3 months if uncontrolled diabetes)? Yes Next Office Visit with your provider: 02/09/24  Clinician Recommendations:  Aim for 30 minutes of exercise or brisk walking, 6-8 glasses of water , and 5 servings of fruits and vegetables each day.       This is a list of the screening recommended for you and due dates:  Health Maintenance  Topic Date Due   DEXA scan (bone density measurement)  03/11/2020   COVID-19 Vaccine (7 - 2024-25 season) 11/26/2023*   Flu Shot  01/08/2024   Complete foot exam   02/04/2024   Eye exam for diabetics  04/13/2024   Mammogram  04/29/2024   Hemoglobin A1C  05/11/2024   Yearly kidney health urinalysis for diabetes  08/09/2024   Yearly kidney function blood test for diabetes  11/09/2024   Medicare Annual Wellness Visit  11/10/2024   DTaP/Tdap/Td vaccine (3 - Td or Tdap) 06/16/2032   Pneumonia Vaccine  Completed   HPV Vaccine  Aged Out   Meningitis B Vaccine  Aged Out   Colon Cancer Screening  Discontinued   Hepatitis C Screening  Discontinued   Zoster (Shingles) Vaccine  Discontinued  *Topic was postponed. The date shown is not the original due date.    Advanced directives: (Copy Requested) Please bring a copy of your health care power of attorney and living will to the  office to be added to your chart at your convenience. You can mail to [facility] [address] [place], [place] [address] or email to [email or web address] Advance Care Planning is important because it:  [x]  Makes sure you receive the medical care that is consistent with your values, goals, and preferences  [x]  It provides guidance to your family and loved ones and reduces their decisional burden about whether or not they are making the right decisions based on your wishes.

## 2023-11-11 NOTE — Assessment & Plan Note (Signed)
 Multiple etiologies likely to blame including proximal muscle weakness and BPV .  She has deferrred PT due to lack of appreciable improvement in the past.

## 2023-11-11 NOTE — Assessment & Plan Note (Signed)
 No progression  thus far .  hgb is stable  Lab Results  Component Value Date   WBC 6.6 09/09/2023   HGB 11.2 (L) 09/09/2023   HCT 33.9 (L) 09/09/2023   MCV 93.9 09/09/2023   PLT 151 09/09/2023

## 2023-11-11 NOTE — Assessment & Plan Note (Signed)
 Recent fall occurred due to loss of balance and proximal leg weakness.   PT referral deferred

## 2023-11-11 NOTE — Assessment & Plan Note (Signed)
 Her dose was rediced from 112 TO 100 MCG OF LEVOTHYROXINE  DAILY  in late August.   Lab Results  Component Value Date   TSH 2.03 05/13/2023

## 2023-11-11 NOTE — Assessment & Plan Note (Signed)
 She has been more irritable for the last several months and has grown intolerant of her husband's routine and lack of emotional support  Referral for counselling has been repeatedly deferred but now accepted.  . The risks and benefits of  Chronic  benzodiazepine use were discussed with patient today including increased risk of dementia,  Addiction, and seizures if abruptly withdrawn  .  Patient was encouraged to reduce use of  alprazolam  to once daily and use Buspar  during the day  instead  .

## 2023-11-11 NOTE — Assessment & Plan Note (Signed)
 Currently well-controlled on current medications . Megan Bradley Patient is reminded to schedule an annual eye exam and foot exam is normal today. Patient has no microalbuminuria. Patient is tolerating statin therapy for CAD risk reduction and on ACE/ARB for renal protection and hypertension     Lab Results  Component Value Date   HGBA1C 6.9 (H) 11/10/2023   Lab Results  Component Value Date   MICROALBUR 1.6 08/10/2023   MICROALBUR 1.5 08/07/2022   Lab Results  Component Value Date   CHOL 125 11/10/2023   HDL 75.80 11/10/2023   LDLCALC 39 11/10/2023   LDLDIRECT 31.0 11/10/2023   TRIG 49.0 11/10/2023   CHOLHDL 2 11/10/2023

## 2023-11-14 ENCOUNTER — Other Ambulatory Visit: Payer: Self-pay | Admitting: Internal Medicine

## 2023-11-14 ENCOUNTER — Ambulatory Visit: Payer: Self-pay | Admitting: Internal Medicine

## 2023-11-14 DIAGNOSIS — N1831 Chronic kidney disease, stage 3a: Secondary | ICD-10-CM

## 2023-11-14 MED ORDER — ERGOCALCIFEROL 1.25 MG (50000 UT) PO CAPS: 50000.0000 [IU] | ORAL_CAPSULE | ORAL | 0 refills | Status: DC

## 2023-11-19 ENCOUNTER — Encounter: Payer: Self-pay | Admitting: Internal Medicine

## 2023-11-27 DIAGNOSIS — M5416 Radiculopathy, lumbar region: Secondary | ICD-10-CM | POA: Diagnosis not present

## 2023-12-01 ENCOUNTER — Other Ambulatory Visit: Payer: Self-pay | Admitting: Internal Medicine

## 2023-12-10 LAB — HEMOGLOBIN A1C: Hemoglobin A1C: 6

## 2023-12-15 DIAGNOSIS — R8290 Unspecified abnormal findings in urine: Secondary | ICD-10-CM | POA: Diagnosis not present

## 2023-12-15 DIAGNOSIS — R609 Edema, unspecified: Secondary | ICD-10-CM | POA: Diagnosis not present

## 2023-12-15 DIAGNOSIS — D472 Monoclonal gammopathy: Secondary | ICD-10-CM | POA: Diagnosis not present

## 2023-12-15 DIAGNOSIS — E1129 Type 2 diabetes mellitus with other diabetic kidney complication: Secondary | ICD-10-CM | POA: Diagnosis not present

## 2023-12-15 DIAGNOSIS — D649 Anemia, unspecified: Secondary | ICD-10-CM | POA: Diagnosis not present

## 2023-12-15 DIAGNOSIS — E785 Hyperlipidemia, unspecified: Secondary | ICD-10-CM | POA: Diagnosis not present

## 2023-12-18 ENCOUNTER — Other Ambulatory Visit: Payer: Self-pay | Admitting: Internal Medicine

## 2023-12-18 DIAGNOSIS — M5416 Radiculopathy, lumbar region: Secondary | ICD-10-CM | POA: Diagnosis not present

## 2023-12-18 DIAGNOSIS — E042 Nontoxic multinodular goiter: Secondary | ICD-10-CM

## 2023-12-21 ENCOUNTER — Encounter: Payer: Self-pay | Admitting: Internal Medicine

## 2024-01-01 ENCOUNTER — Other Ambulatory Visit: Payer: Medicare Other

## 2024-01-05 ENCOUNTER — Encounter: Payer: Self-pay | Admitting: Internal Medicine

## 2024-01-12 DIAGNOSIS — D649 Anemia, unspecified: Secondary | ICD-10-CM | POA: Diagnosis not present

## 2024-01-12 DIAGNOSIS — E785 Hyperlipidemia, unspecified: Secondary | ICD-10-CM | POA: Diagnosis not present

## 2024-01-12 DIAGNOSIS — E1129 Type 2 diabetes mellitus with other diabetic kidney complication: Secondary | ICD-10-CM | POA: Diagnosis not present

## 2024-01-12 DIAGNOSIS — D472 Monoclonal gammopathy: Secondary | ICD-10-CM | POA: Diagnosis not present

## 2024-01-12 DIAGNOSIS — R609 Edema, unspecified: Secondary | ICD-10-CM | POA: Diagnosis not present

## 2024-01-13 ENCOUNTER — Other Ambulatory Visit: Payer: Self-pay | Admitting: Internal Medicine

## 2024-02-06 ENCOUNTER — Other Ambulatory Visit: Payer: Self-pay | Admitting: Internal Medicine

## 2024-02-09 ENCOUNTER — Encounter: Payer: Self-pay | Admitting: Internal Medicine

## 2024-02-09 ENCOUNTER — Ambulatory Visit: Admitting: Internal Medicine

## 2024-02-09 VITALS — BP 120/58 | HR 67 | Ht 65.5 in | Wt 143.4 lb

## 2024-02-09 DIAGNOSIS — E559 Vitamin D deficiency, unspecified: Secondary | ICD-10-CM | POA: Diagnosis not present

## 2024-02-09 DIAGNOSIS — E1169 Type 2 diabetes mellitus with other specified complication: Secondary | ICD-10-CM | POA: Diagnosis not present

## 2024-02-09 DIAGNOSIS — L281 Prurigo nodularis: Secondary | ICD-10-CM

## 2024-02-09 DIAGNOSIS — E032 Hypothyroidism due to medicaments and other exogenous substances: Secondary | ICD-10-CM | POA: Diagnosis not present

## 2024-02-09 DIAGNOSIS — R2689 Other abnormalities of gait and mobility: Secondary | ICD-10-CM

## 2024-02-09 DIAGNOSIS — Z780 Asymptomatic menopausal state: Secondary | ICD-10-CM

## 2024-02-09 DIAGNOSIS — E785 Hyperlipidemia, unspecified: Secondary | ICD-10-CM

## 2024-02-09 DIAGNOSIS — E1142 Type 2 diabetes mellitus with diabetic polyneuropathy: Secondary | ICD-10-CM

## 2024-02-09 DIAGNOSIS — E1122 Type 2 diabetes mellitus with diabetic chronic kidney disease: Secondary | ICD-10-CM

## 2024-02-09 DIAGNOSIS — R1319 Other dysphagia: Secondary | ICD-10-CM | POA: Diagnosis not present

## 2024-02-09 DIAGNOSIS — D696 Thrombocytopenia, unspecified: Secondary | ICD-10-CM | POA: Diagnosis not present

## 2024-02-09 DIAGNOSIS — M5416 Radiculopathy, lumbar region: Secondary | ICD-10-CM

## 2024-02-09 DIAGNOSIS — N1831 Chronic kidney disease, stage 3a: Secondary | ICD-10-CM | POA: Diagnosis not present

## 2024-02-09 DIAGNOSIS — M810 Age-related osteoporosis without current pathological fracture: Secondary | ICD-10-CM

## 2024-02-09 DIAGNOSIS — N879 Dysplasia of cervix uteri, unspecified: Secondary | ICD-10-CM

## 2024-02-09 DIAGNOSIS — E042 Nontoxic multinodular goiter: Secondary | ICD-10-CM

## 2024-02-09 DIAGNOSIS — N183 Chronic kidney disease, stage 3 unspecified: Secondary | ICD-10-CM

## 2024-02-09 DIAGNOSIS — D631 Anemia in chronic kidney disease: Secondary | ICD-10-CM

## 2024-02-09 DIAGNOSIS — D472 Monoclonal gammopathy: Secondary | ICD-10-CM

## 2024-02-09 DIAGNOSIS — G4733 Obstructive sleep apnea (adult) (pediatric): Secondary | ICD-10-CM

## 2024-02-09 DIAGNOSIS — F5105 Insomnia due to other mental disorder: Secondary | ICD-10-CM

## 2024-02-09 MED ORDER — LEVOTHYROXINE SODIUM 100 MCG PO TABS
100.0000 ug | ORAL_TABLET | Freq: Every day | ORAL | 2 refills | Status: DC
Start: 2024-02-09 — End: 2024-04-10

## 2024-02-09 MED ORDER — RALOXIFENE HCL 60 MG PO TABS: 60.0000 mg | ORAL_TABLET | Freq: Every day | ORAL | 0 refills | Status: DC

## 2024-02-09 MED ORDER — BUSPIRONE HCL 10 MG PO TABS: 10.0000 mg | ORAL_TABLET | Freq: Three times a day (TID) | ORAL | 2 refills | Status: AC

## 2024-02-09 NOTE — Patient Instructions (Addendum)
 Please start taking 1000 international units  of Vitamin D3 daily   We will recheck your platelets your hemoglobin and your Vitamin D3  in late October  and I will forward to Dr Melanee Irons the fingernail ALONE!   If the cuticle gets red or enflamed, let me know and I will send in an antibiotic   Your bone density test  been ordered.  Please call to make your appointment at [facility]  [phone]

## 2024-02-09 NOTE — Progress Notes (Unsigned)
 Subjective:  Patient ID: Megan Bradley, female    DOB: [birth year]  Age: 83 y.o. MRN: [id number]  CC: The primary encounter diagnosis was Postmenopausal estrogen deficiency. Diagnoses of Multinodular goiter, Thrombocytopenia (HCC), Vitamin D  deficiency, Balance problem, Type 2 DM with diabetic neuropathy affecting both sides of body (HCC), Iatrogenic hypothyroidism, Cervical dysplasia, CKD stage 3 secondary to diabetes (HCC), Anemia due to stage 3a chronic kidney disease (HCC), Esophageal dysphagia, Hyperlipidemia associated with type 2 diabetes mellitus (HCC), MGUS (monoclonal gammopathy of unknown significance), Lumbar radiculopathy, Prurigo nodularis, Insomnia due to mental condition, and OSA (obstructive sleep apnea) were also pertinent to this visit.   HPI Megan Bradley presents for  Chief Complaint  Patient presents with   Medical Management of Chronic Issues    3 month follow up on diabetes   1) Zencker's diverticululum:  her condition has been causing frequent dysphagia for meat,  rice, etc.  Has a weekly episode of involuntary regurgitation, and liquids often get regurgitated through her nose.  2) ow back pain: she has been receiving  lumbar ESI 's every 3 months  by [place] at [facility], last one in June did not provide relief for as long an interval as the previous one   3) DM:   T2DM:  She  feels generally well,  But is not  exercising regularly or trying to lose weight. Checking  blood sugars less than once daily at variable times, usually only if she feels she may be having a hypoglycemic event. .  BS have been under 130 fasting and < 150 post prandially.  Denies any recent hypoglyemic events.  Taking   medications as directed. Following a carbohydrate modified diet 6 days per week. Denies numbness, burning and tingling of extremities. Appetite is good.  [employer] sent out an Charity fundraiser for a home visit and A1c was 6.0   4) Anemia : her hgb has been stable,  but her last CBC  was remarkable for a drop in platelets   5)  poor balance: she has noted increased effort at maintaining balance and is requesting repeat PT .  She has a history of falls,  but did  not fall during her recent 12 day walking tour of the [place]   6) Fatigue: averaging 7-8 hours of sleep with use of 0.125 mg  xanax .  No grogginess    7) Prurigo nodularis:  skin condition has been improving with use of  Dupixent biweekly at home . legs are improving    Outpatient Medications Prior to Visit  Medication Sig Dispense Refill   ACCU-CHEK GUIDE TEST test strip USE TO CHECK BLOOD SUGAR ONCE  DAILY 100 strip 2   ALPRAZolam  (XANAX ) 0.5 MG tablet Take 1 tablet by mouth twice daily as needed for anxiety 60 tablet 5   DUPIXENT 300 MG/2ML SOPN Every other Friday     ezetimibe  (ZETIA ) 10 MG tablet Take 1 tablet (10 mg total) by mouth daily. 90 tablet 3   hyoscyamine  (ANASPAZ ) 0.125 MG TBDP disintergrating tablet PLACE ONE TABLET UNDER THE TONGUE EVERY SIX HOURS AS NEEDED 30 tablet 0   isosorbide  mononitrate (IMDUR ) 30 MG 24 hr tablet TAKE 1 TABLET BY MOUTH DAILY 100 tablet 2   meclizine  (ANTIVERT ) 25 MG tablet TAKE 1 TABLET BY MOUTH THREE TIMES DAILY AS NEEDED FOR VERTIGO 30 tablet 0   omeprazole  (PRILOSEC) 40 MG capsule Take 1 capsule (40 mg total) by mouth 2 (two) times daily. 180 capsule 3  rosuvastatin  (CRESTOR ) 20 MG tablet TAKE 1 TABLET BY MOUTH EVERY  OTHER DAY 50 tablet 2   sertraline  (ZOLOFT ) 100 MG tablet TAKE 2 TABLETS BY MOUTH DAILY 180 tablet 3   busPIRone  (BUSPAR ) 10 MG tablet Take 1 tablet (10 mg total) by mouth 3 (three) times daily. 90 tablet 2   levothyroxine  (SYNTHROID ) 100 MCG tablet TAKE 1 TABLET BY MOUTH DAILY  BEFORE BREAKFAST 100 tablet 2   raloxifene  (EVISTA ) 60 MG tablet Take 1 tablet by mouth once daily 90 tablet 0   nitroGLYCERIN  (NITROSTAT ) 0.4 MG SL tablet Place 1 tablet (0.4 mg total) under the tongue once as needed for up to 1 dose for chest pain. Or esophageal  spasm (Patient not taking: Reported on 02/09/2024) 30 tablet 0   Vitamin D , Ergocalciferol , (DRISDOL ) 1.25 MG (50000 UNIT) CAPS capsule TAKE 1 CAPSULE BY MOUTH ONCE  WEEKLY 12 capsule 1   Facility-Administered Medications Prior to Visit  Medication Dose Route Frequency Provider Last Rate Last Admin   clindamycin  (CLEOCIN ) IVPB 600 mg  600 mg Intravenous Once Krasinski, Kevin, MD        Review of Systems;  Patient denies headache, fevers, malaise, unintentional weight loss, eye pain, sinus congestion and sinus pain, sore throat, dysphagia,  hemoptysis , cough, dyspnea, wheezing, chest pain, palpitations, orthopnea, edema, abdominal pain, nausea, melena, diarrhea, constipation, flank pain, dysuria, hematuria, urinary  Frequency, nocturia, numbness, tingling, seizures,  Focal weakness, Loss of consciousness,  Tremor, insomnia, depression, anxiety, and suicidal ideation.      Objective:  BP (!) 120/58   Pulse 67   Ht 5' 5.5 (1.664 m)   Wt 143 lb 6.4 oz (65 kg)   SpO2 98%   BMI 23.50 kg/m   BP Readings from Last 3 Encounters:  02/09/24 (!) 120/58  11/10/23 (!) 144/66  09/09/23 109/68    Wt Readings from Last 3 Encounters:  02/09/24 143 lb 6.4 oz (65 kg)  11/11/23 140 lb (63.5 kg)  11/10/23 144 lb (65.3 kg)    Physical Exam Vitals reviewed.  Constitutional:      General: She is not in acute distress.    Appearance: Normal appearance. She is normal weight. She is not ill-appearing, toxic-appearing or diaphoretic.  HENT:     Head: Normocephalic.  Eyes:     General: No scleral icterus.       Right eye: No discharge.        Left eye: No discharge.     Conjunctiva/sclera: Conjunctivae normal.  Cardiovascular:     Rate and Rhythm: Normal rate and regular rhythm.     Heart sounds: Normal heart sounds.  Pulmonary:     Effort: Pulmonary effort is normal. No respiratory distress.     Breath sounds: Normal breath sounds.  Musculoskeletal:        General: Normal range of motion.   Skin:    General: Skin is warm and dry.     Coloration: Skin is pale. Skin is not mottled.     Findings: Erythema and rash present. No burn. Rash is nodular.     Comments: Lower extremities bilaterally   Neurological:     General: No focal deficit present.     Mental Status: She is alert and oriented to person, place, and time. Mental status is at baseline.  Psychiatric:        Mood and Affect: Mood normal.        Behavior: Behavior normal.        Thought  Content: Thought content normal.        Judgment: Judgment normal.     Lab Results  Component Value Date   HGBA1C 6.0 12/10/2023   HGBA1C 6.9 (H) 11/10/2023   HGBA1C 6.9 (H) 08/10/2023    Lab Results  Component Value Date   CREATININE 0.93 11/10/2023   CREATININE 0.98 09/09/2023   CREATININE 0.85 08/10/2023    Lab Results  Component Value Date   WBC 6.6 09/09/2023   HGB 11.2 (L) 09/09/2023   HCT 33.9 (L) 09/09/2023   PLT 151 09/09/2023   GLUCOSE 82 11/10/2023   CHOL 125 11/10/2023   TRIG 49.0 11/10/2023   HDL 75.80 11/10/2023   LDLDIRECT 31.0 11/10/2023   LDLCALC 39 11/10/2023   ALT 12 11/10/2023   AST 17 11/10/2023   NA 140 11/10/2023   K 4.5 11/10/2023   CL 104 11/10/2023   CREATININE 0.93 11/10/2023   BUN 16 11/10/2023   CO2 29 11/10/2023   TSH 2.03 05/13/2023   INR 1.0 09/26/2020   HGBA1C 6.0 12/10/2023   MICROALBUR 1.6 08/10/2023    No results found.  Assessment & Plan:  .Postmenopausal estrogen deficiency -     DG Bone Density; Future  Multinodular goiter -     Levothyroxine  Sodium; Take 1 tablet (100 mcg total) by mouth daily before breakfast.  Dispense: 100 tablet; Refill: 2  Thrombocytopenia (HCC) -     CBC with Differential/Platelet; Future -     Lactate dehydrogenase; Future  Vitamin D  deficiency -     VITAMIN D  25 Hydroxy (Vit-D Deficiency, Fractures); Future  Balance problem Assessment & Plan: Multiple etiologies likely to blame including proximal muscle weakness and BPV .  She  has requested repeat  PT  Orders: -     Ambulatory referral to Physical Therapy  Type 2 DM with diabetic neuropathy affecting both sides of body (HCC) -     Hemoglobin A1c; Future  Iatrogenic hypothyroidism Assessment & Plan: Her dose was reduced from 112 TO 100 MCG OF LEVOTHYROXINE  DAILY  in late August 2023 .  Lab Results  Component Value Date   TSH 2.03 05/13/2023        Orders: -     TSH; Future  Cervical dysplasia Assessment & Plan: Screening stopped in 2016 by GYN ONC after repeatedy normal PAP smears. ([facility])   CKD stage 3 secondary to diabetes [facility]) Assessment & Plan: She has been referred to  nephrology.  Reviewed recent workup in July; no secondary causes found for anemia.  She is avoiding   NSAIDS,  maintaining BP < 130/80, and controlling her diabetes and lipids.    Lab Results  Component Value Date   CREATININE 0.93 11/10/2023   Lab Results  Component Value Date   MICROALBUR 1.6 08/10/2023        Anemia due to stage 3a chronic kidney disease (HCC) Assessment & Plan: Attributed to CKD per nephrology/oncology,  stable. Most recent outside cbc noted low platelets.  Will repeat and refr to hemo onc if persistent   Lab Results  Component Value Date   WBC 6.6 09/09/2023   HGB 11.2 (L) 09/09/2023   HCT 33.9 (L) 09/09/2023   MCV 93.9 09/09/2023   PLT 151 09/09/2023   Lab Results  Component Value Date   IRON 140 09/09/2023   TIBC 358 09/09/2023   FERRITIN 244 09/09/2023      Esophageal dysphagia Assessment & Plan: Secondary to presbyeosphagus,  nutcracker esophagus and Zenckers Diverticulum by prior  EGD. . Swallow function is worsening with weekly episodes of regurgitation. Follow up with GI/ENT   Hyperlipidemia associated with type 2 diabetes mellitus (HCC) Assessment & Plan: She is at goal with daily Zetia  and QOD rosuvastatin  per patient preference.     Her diabetes is controlled with diet alone. No changes today  Lab Results  Component  Value Date   HGBA1C 6.0 12/10/2023    Lab Results  Component Value Date   MICROALBUR 1.6 08/10/2023       Lab Results  Component Value Date   CHOL 125 11/10/2023   HDL 75.80 11/10/2023   LDLCALC 39 11/10/2023   LDLDIRECT 31.0 11/10/2023   TRIG 49.0 11/10/2023   CHOLHDL 2 11/10/2023   Lab Results  Component Value Date   ALT 12 11/10/2023   AST 17 11/10/2023   ALKPHOS 44 11/10/2023   BILITOT 0.7 11/10/2023      MGUS (monoclonal gammopathy of unknown significance) Assessment & Plan: No progression  thus far .  hgb is stable, but platelets were 127 recently during nephrology follow up in July.  Repeat in [place].   Lab Results  Component Value Date   WBC 6.6 09/09/2023   HGB 11.2 (L) 09/09/2023   HCT 33.9 (L) 09/09/2023   MCV 93.9 09/09/2023   PLT 151 09/09/2023      Lumbar radiculopathy Assessment & Plan: Her pain has been managed with quarterly ESI s by [facility].  Her last ESI in June had diminished effectiveness.    Prurigo nodularis Assessment & Plan: Improving skin condition with biweekly  Dupixent prescribed by Dr Dela    Insomnia due to mental condition Assessment & Plan: Secondary to anxiety.  Excellent sleep maintenance with low dose alprazolam .  The risks and benefits of  Chronic  benzodiazepine use were reviewed  with patient today including increased risk of dementia,  Addiction, and seizures if abruptly withdrawn  .     OSA (obstructive sleep apnea) Assessment & Plan: No treated due to tracheal deviation resulting in mask intolerance. Repeat sleep study has not been done since her goitrectomy but she has no symptoms currenlty of untreated OSA    Other orders -     busPIRone  HCl; Take 1 tablet (10 mg total) by mouth 3 (three) times daily.  Dispense: 90 tablet; Refill: 2 -     Raloxifene  HCl; Take 1 tablet (60 mg total) by mouth daily.  Dispense: 90 tablet; Refill: 0  I personally spent a total of 45 minutes in the care of the patient  today including preparing to see the patient, getting/reviewing separately obtained history from Nephrology . GYN ONC, and orthopedics  prior EGD reports and PAP smears/path reports. , performing a medically appropriate exam/evaluation, counseling and educating, placing orders, referring and communicating with other health care professionals, and documenting clinical information in the EHR.    Follow-up: Return in about 3 months (around 05/10/2024) for follow up diabetes.   Verneita LITTIE Kettering, MD

## 2024-02-12 ENCOUNTER — Encounter: Payer: Self-pay | Admitting: Internal Medicine

## 2024-02-12 DIAGNOSIS — F5105 Insomnia due to other mental disorder: Secondary | ICD-10-CM | POA: Insufficient documentation

## 2024-02-12 NOTE — Assessment & Plan Note (Signed)
 Screening stopped in 2016 by GYN ONC after repeatedy normal PAP smears. ([facility])

## 2024-02-12 NOTE — Assessment & Plan Note (Signed)
 Attributed to CKD per nephrology/oncology,  stable. Most recent outside cbc noted low platelets.  Will repeat and refr to hemo onc if persistent   Lab Results  Component Value Date   WBC 6.6 09/09/2023   HGB 11.2 (L) 09/09/2023   HCT 33.9 (L) 09/09/2023   MCV 93.9 09/09/2023   PLT 151 09/09/2023   Lab Results  Component Value Date   IRON 140 09/09/2023   TIBC 358 09/09/2023   FERRITIN 244 09/09/2023

## 2024-02-12 NOTE — Assessment & Plan Note (Addendum)
 She has been referred to  nephrology.  Reviewed recent workup in July; no secondary causes found for anemia.  She is avoiding   NSAIDS,  maintaining BP < 130/80, and controlling her diabetes and lipids.    Lab Results  Component Value Date   CREATININE 0.93 11/10/2023   Lab Results  Component Value Date   MICROALBUR 1.6 08/10/2023

## 2024-02-12 NOTE — Assessment & Plan Note (Signed)
 Secondary to anxiety.  Excellent sleep maintenance with low dose alprazolam .  The risks and benefits of  Chronic  benzodiazepine use were reviewed  with patient today including increased risk of dementia,  Addiction, and seizures if abruptly withdrawn  .

## 2024-02-12 NOTE — Assessment & Plan Note (Signed)
 Her dose was reduced from 112 TO 100 MCG OF LEVOTHYROXINE  DAILY  in late August 2023 .  Lab Results  Component Value Date   TSH 2.03 05/13/2023

## 2024-02-12 NOTE — Assessment & Plan Note (Signed)
 Multiple etiologies likely to blame including proximal muscle weakness and BPV .  She has requested repeat  PT

## 2024-02-12 NOTE — Assessment & Plan Note (Signed)
 No treated due to tracheal deviation resulting in mask intolerance. Repeat sleep study has not been done since her goitrectomy but she has no symptoms currenlty of untreated OSA

## 2024-02-12 NOTE — Assessment & Plan Note (Addendum)
 She is at goal with daily Zetia  and QOD rosuvastatin  per patient preference.     Her diabetes is controlled with diet alone. No changes today  Lab Results  Component Value Date   HGBA1C 6.0 12/10/2023    Lab Results  Component Value Date   MICROALBUR 1.6 08/10/2023       Lab Results  Component Value Date   CHOL 125 11/10/2023   HDL 75.80 11/10/2023   LDLCALC 39 11/10/2023   LDLDIRECT 31.0 11/10/2023   TRIG 49.0 11/10/2023   CHOLHDL 2 11/10/2023   Lab Results  Component Value Date   ALT 12 11/10/2023   AST 17 11/10/2023   ALKPHOS 44 11/10/2023   BILITOT 0.7 11/10/2023

## 2024-02-12 NOTE — Assessment & Plan Note (Signed)
 Secondary to presbyeosphagus,  nutcracker esophagus and Zenckers Diverticulum by prior EGD. SABRA Swallow function is worsening with weekly episodes of regurgitation. Follow up with GI/ENT

## 2024-02-12 NOTE — Assessment & Plan Note (Signed)
 No progression  thus far .  hgb is stable, but platelets were 127 recently during nephrology follow up in July.  Repeat in [place].   Lab Results  Component Value Date   WBC 6.6 09/09/2023   HGB 11.2 (L) 09/09/2023   HCT 33.9 (L) 09/09/2023   MCV 93.9 09/09/2023   PLT 151 09/09/2023

## 2024-02-12 NOTE — Assessment & Plan Note (Signed)
 Her pain has been managed with quarterly ESI s by [facility].  Her last ESI in June had diminished effectiveness.

## 2024-02-12 NOTE — Assessment & Plan Note (Signed)
 Improving skin condition with biweekly  Dupixent prescribed by Dr Dela

## 2024-03-14 ENCOUNTER — Ambulatory Visit: Attending: Internal Medicine | Admitting: Physical Therapy

## 2024-03-14 DIAGNOSIS — R2681 Unsteadiness on feet: Secondary | ICD-10-CM | POA: Insufficient documentation

## 2024-03-14 DIAGNOSIS — R2689 Other abnormalities of gait and mobility: Secondary | ICD-10-CM | POA: Diagnosis present

## 2024-03-14 DIAGNOSIS — R262 Difficulty in walking, not elsewhere classified: Secondary | ICD-10-CM | POA: Insufficient documentation

## 2024-03-14 DIAGNOSIS — M6281 Muscle weakness (generalized): Secondary | ICD-10-CM | POA: Insufficient documentation

## 2024-03-14 DIAGNOSIS — R269 Unspecified abnormalities of gait and mobility: Secondary | ICD-10-CM | POA: Insufficient documentation

## 2024-03-14 NOTE — Therapy (Signed)
 OUTPATIENT PHYSICAL THERAPY NEURO EVALUATION   Patient Name: Megan Bradley MRN: [id number] DOB:[birth year], 83 y.o., female Today's Date: 03/14/2024   PCP: Marylynn Verneita CROME, MD  REFERRING PROVIDER: Marylynn Verneita CROME, MD  END OF SESSION:  PT End of Session - 03/14/24 1457     Visit Number 4    Number of Visits 17    Date for Recertification  07/31/20    Authorization Type 2/10    Progress Note Due on Visit 10    PT Start Time 1453    PT Stop Time 1529    PT Time Calculation (min) 36 min    Equipment Utilized During Treatment Gait belt    Activity Tolerance Patient tolerated treatment well;No increased pain;Treatment limited secondary to medical complications (Comment)   Upper respiratory congestion   Behavior During Therapy [facility] for tasks assessed/performed          Past Medical History:  Diagnosis Date   Abnormal cervical Papanicolaou smear 01/05/2020   Overview:   a.  Pap smears q 4 to 6 months.       b.  LEEP procedure 04-2009.      Allergy COEDINE; OXY DRUGS   Anemia RECENTLY   Anxiety YES, QUITE DIFFICULT AT TIMES   Anxiety disorder    Aortic atherosclerosis    Arthritis SOME; JUST HAD KNEE REPLACEMENT   both knees   Breast disorder in female 10/14/2019   Cervical dysplasia    a. 2010 - high grade squamous intraepithelial lesion s/p excision.   Chest pain    a. 2006 or 2007 [facility]): reportedly nl cors;  b. 09/2011 ETT: nl.   Depression SOME PROBABLY   Diabetes mellitus without complication (HCC) CONTROLLED WITHOUT RX   DOE (dyspnea on exertion)    Family history of adverse reaction to anesthesia    grand daughter was hard to wake up after back surgery   GERD (gastroesophageal reflux disease) YES AND A JACKHAMMER ESOPHEGUS   Grade II diastolic dysfunction    Herpes zoster    Hyperlipidemia    Hypothyroidism    Mild aortic regurgitation    Nutcracker esophagus    Orthostatic hypotension    Pneumonia    Pre-diabetes    Ruptured left breast implant,  initial encounter 07/05/2018   Skin cancer AT LEAST 15 YEARS AGO, AND CONTINUING,   Sleep apnea    does not use cpap, did not tolerate   Vertigo    Wears dentures    partial lower   Past Surgical History:  Procedure Laterality Date   AUGMENTATION MAMMAPLASTY Bilateral    BREAST EXCISIONAL BIOPSY Left    CARDIAC CATHETERIZATION  2003   Dr.Callwood, R/L heart cath,   COLONOSCOPY WITH PROPOFOL  N/A 06/12/2017   Procedure: COLONOSCOPY WITH PROPOFOL ;  Surgeon: Jinny Carmine, MD;  Location: [facility];  Service: Endoscopy;  Laterality: N/A;   dental work     ESOPHAGEAL MANOMETRY N/A 06/19/2016   Procedure: ESOPHAGEAL MANOMETRY (EM);  Surgeon: Carmine Jinny, MD;  Location: [facility];  Service: Endoscopy;  Laterality: N/A;   ESOPHAGOGASTRODUODENOSCOPY (EGD) WITH PROPOFOL  N/A 04/03/2017   Procedure: ESOPHAGOGASTRODUODENOSCOPY (EGD) WITH PROPOFOL ;  Surgeon: Jinny Carmine, MD;  Location: [facility];  Service: Endoscopy;  Laterality: N/A;  diabetic - diet controlled   EYE SURGERY  CATARACTS REMOVED - 2   THYROIDECTOMY  05/15/2015   [facility]   TONSILLECTOMY     TOTAL KNEE ARTHROPLASTY Right 10/25/2020   Procedure: RIGHT TOTAL KNEE ARTHROPLASTY;  Surgeon: Marchia,  Franky, MD;  Location: [facility];  Service: Orthopedics;  Laterality: Right;   Patient Active Problem List   Diagnosis Date Noted   Insomnia due to mental condition 02/12/2024   Schatzki's ring of distal esophagus 08/11/2023   Travel advice encounter 05/12/2023   Lumbar radiculopathy 07/29/2022   CKD stage 3 secondary to diabetes (HCC) 05/17/2022   Zenkers diverticulum 05/05/2022   Presbyesophagus 01/30/2022   Prurigo nodularis 07/30/2021   Abnormal breast finding 07/30/2021   MGUS (monoclonal gammopathy of unknown significance) 01/22/2021   Anemia 01/09/2021   S/P TKR (total knee replacement) using cement, right 10/25/2020   Preoperative clearance 09/18/2020   Aortic atherosclerosis 07/10/2020   Exertional  dyspnea 05/20/2020   Balance problem 05/12/2020   History of recent fall 05/12/2020   Type 2 DM with diabetic neuropathy affecting both sides of body (HCC) 01/05/2020   Bilateral chronic knee pain 04/11/2018   S/P breast implant, silicone 03/11/2018   Osteoporosis of femur without pathological fracture 03/11/2018   Nutcracker esophagus 01/01/2018   Tubular adenoma of colon    Squamous cell skin cancer 07/21/2016   Dysphagia 04/08/2016   RBBB    Chest pain    OSA (obstructive sleep apnea) 11/22/2013   Hypovitaminosis D 01/11/2013   History of pernicious anemia 01/11/2013   Hyperlipidemia associated with type 2 diabetes mellitus (HCC) 09/04/2011   Multiple thyroid  nodules 08/27/2011   Cervical dysplasia 08/27/2011   Screening for breast cancer 03/13/2011   Iatrogenic hypothyroidism    GERD (gastroesophageal reflux disease)    Anxiety with obsessional features    Basal cell carcinoma of face 10/03/2010    ONSET DATE: 02/09/24  REFERRING DIAG: R26.89 (ICD-10-CM) - Balance problem   THERAPY DIAG:  No diagnosis found.  Rationale for Evaluation and Treatment: Rehabilitation  SUBJECTIVE:                                                                                                                                                                                             SUBJECTIVE STATEMENT: Pt reports she has had PT previously. Pt has pinched nerve in her spine that she gets shots [facility] at [facility]. Pt balance is very challenging in dark environments. Pt has had multiple falls where she has no recall of why she fell. Pt has difficulty walking straight most of the time and frequently feels like she is going to fall. Pt also has difficulty turning. Pt reports no dizziness just general imbalance.  Pt accompanied by: self  PERTINENT HISTORY: Knee replacement, low back pain, DM, HLD,   PAIN:  Are you having pain? Has pain in back occasionally,  none in seated position at present    PRECAUTIONS: None  RED FLAGS: None   WEIGHT BEARING RESTRICTIONS: No  FALLS: Has patient fallen in last 6 months? Yes. Number of falls 2-3  LIVING ENVIRONMENT: Lives with: lives with their spouse Lives in: House/apartment Stairs: yes but level entry and does not need to go upstairs in her home  Has following equipment at home: Walker - 2 wheeled and does not regularly use AD  PLOF: Independent and Independent with basic ADLs  PATIENT GOALS: Improve balance and reduce fall risk   OBJECTIVE:  Note: Objective measures were completed at Evaluation unless otherwise noted.  COGNITION: Overall cognitive status: Within functional limits for tasks assessed   SENSATION: Not tested  LOWER EXTREMITY ROM:   WNL for tasks assessed  LOWER EXTREMITY MMT:    MMT Right Eval Left Eval  Hip flexion 4 4  Hip extension    Hip abduction 4 4  Hip adduction 4+ 4+  Hip internal rotation    Hip external rotation    Knee flexion 4+ 4+  Knee extension 4+ 4+  Ankle dorsiflexion    Ankle plantarflexion    Ankle inversion    Ankle eversion    (Blank rows = not tested)   TRANSFERS: Sit to stand: Complete Independence  Assistive device utilized: knee valgus bilateral and None     Stand to sit: Complete Independence and knee valgus noted  Assistive device utilized: None     Chair to chair: Complete Independence  Assistive device utilized: None       RAMP:  Not tested  CURB:  Not tested  STAIRS: Not tested GAIT: Findings: Gait Characteristics: decreased arm swing- Right, decreased arm swing- Left, decreased step length- Right, decreased step length- Left, decreased stride length, decreased hip/knee flexion- Right, and decreased hip/knee flexion- Left, Distance walked: 30 ft, and Comments: 1 LOB with step recovery   FUNCTIONAL TESTS:  MCTSIB: Condition 1: Avg of 3 trials: 30 sec, Condition 2: Avg of 3 trials: 30 sec, Condition 3: Avg of 3 trials: 30 sec, Condition 4: Avg of 3  trials: 15 sec, and Total Score: 105/120 5XSTS: 21.12 sec no UE assist, B knee valgus throughout  10 Meter Walk Test: Patient instructed to walk 10 meters (32.8 ft) as quickly and as safely as possible at their normal speed Results: .58 m/s (17.06 seconds )  Cut off scores:   Household Ambulator  < 0.4 m/s  Limited Community Ambulator  0.4 - 0.8 m/s  [employer]  > 0.8 m/s  Increased fall risk  < 1.46m/s  Crossing a Street  >1.53m/s  MCID 0.05 m/s (small), 0.13 m/s (moderate), 0.06 m/s (significant)  (ANPTA Core Set of Outcome Measures for Adults with Neurologic Conditions, 2018)       PATIENT SURVEYS:  ABC scale: The Activities-Specific Balance Confidence (ABC) Scale 0% 10 20 30  40 50 60 70 80 90 100% No confidence<->completely confident  "How confident are you that you will not lose your balance or become unsteady when you . . .   Date tested   Walk around the house 50%  2. Walk up or down stairs 70%  3. Bend over and pick up a slipper from in front of a closet floor 60%  4. Reach for a small can off a shelf at eye level 90%  5. Stand on tip toes and reach for something above your head 70%  6. Stand on a chair and reach for something  90%  7. Sweep the floor 90%  8. Walk outside the house to a car parked in the driveway 50%  9. Get into or out of a car 60%  10. Walk across a parking lot to the mall 50%  11. Walk up or down a ramp 90%  12. Walk in a crowded mall where people rapidly walk past you 60%  13. Are bumped into by people as you walk through the mall 50%  14. Step onto or off of an escalator while you are holding onto the railing 50%  15. Step onto or off an escalator while holding onto parcels such that you cannot hold onto the railing 0%  16. Walk outside on icy sidewalks 0%  Total: #/16 58.125%                                                                                                                                 TREATMENT DATE: 03/14/24    SELF CARE Patient instructed in plan of care, findings for evaluation, and ways of physical therapy may improve their function and quality of life.     PATIENT EDUCATION: Education details: POC Person educated: Patient Education method: Explanation Education comprehension: verbalized understanding   HOME EXERCISE PROGRAM: Establish visit 2    GOALS: Goals reviewed with patient? Yes  SHORT TERM GOALS: Target date: 04/11/2024     Patient will be independent in home exercise program to improve strength/mobility for better functional independence with ADLs. Baseline: No HEP currently  Goal status: INITIAL   LONG TERM GOALS: Target date: 06/06/2024   1.  Patient will complete five times sit to stand test in < 15 seconds indicating an increased LE strength and improved balance. Baseline: 21.12 sec no UE Goal status: INITIAL  2.  Patient will improve ABC score to 70%   to demonstrate statistically significant improvement in mobility and quality of life as it relates to their balance confidence.  Baseline: 58.125% Goal status: INITIAL   3.  Patient will increase Berg Balance score by > 6 points to demonstrate decreased fall risk during functional activities. Baseline: TBD Goal status: INITIAL   4.   Patient will increase 10 meter walk test to >1.54m/s as to improve gait speed for better community ambulation and to reduce fall risk. Baseline: .58 m/s Goal status: INITIAL  5.   Patient will maintain stance in condition 4 of MCTSIB test for 30 sec on average of 3 trials in order to indicate improved balance in conditions of low of minimal light and compliant surface.  Baseline: 15 sec average of 3 trials  Goal status: INITIAL    ASSESSMENT:  CLINICAL IMPRESSION: Patient is a 83 year old female who presents to physical therapy for balance and mobility impairments.  Patient has had multiple falls within the last year and at least 3 falls in the last 6 months.  Patient  currently ambulates with shorter step length bilateral and  as well as shows weakness in her hip external rotators during her sit to stand and functional movements.  In addition patient's balance with her eyes closed on compliant surface shows limitations which is concordant with her symptoms of having very poor balance awareness and difficulty balancing and low light and dark environments.  Patient will benefit from skilled physical therapy in order to improve her balance, improve her strength, and improve her overall mobility and quality of life.  OBJECTIVE IMPAIRMENTS: Abnormal gait, decreased activity tolerance, decreased balance, decreased mobility, difficulty walking, and decreased strength.   ACTIVITY LIMITATIONS: standing, squatting, stairs, transfers, and locomotion level  PARTICIPATION LIMITATIONS: community activity, occupation, and yard work  PERSONAL FACTORS: Age, Time since onset of injury/illness/exacerbation, and 3+ comorbidities: DM, Anxiety, DOE, GERD with Jackhammer esophagus are also affecting patient's functional outcome.   REHAB POTENTIAL: Good  CLINICAL DECISION MAKING: Evolving/moderate complexity  EVALUATION COMPLEXITY: Moderate  PLAN:  PT FREQUENCY: 2x/week  PT DURATION: 12 weeks  PLANNED INTERVENTIONS: 97750- Physical Performance Testing, 97110-Therapeutic exercises, 97530- Therapeutic activity, [id number]- Neuromuscular re-education, 97535- Self Care, [id number]- Manual therapy, [phone]- Gait training, [phone]- Canalith repositioning, Patient/Family education, Balance training, Stair training, and Vestibular training  PLAN FOR NEXT SESSION: BERG, HEP - hip strength and balance   Lonni KATHEE Gainer, PT 03/14/2024, 2:58 PM

## 2024-03-16 ENCOUNTER — Ambulatory Visit: Admitting: Physical Therapy

## 2024-03-16 DIAGNOSIS — R2681 Unsteadiness on feet: Secondary | ICD-10-CM

## 2024-03-16 DIAGNOSIS — R2689 Other abnormalities of gait and mobility: Secondary | ICD-10-CM

## 2024-03-16 DIAGNOSIS — R269 Unspecified abnormalities of gait and mobility: Secondary | ICD-10-CM | POA: Diagnosis not present

## 2024-03-16 DIAGNOSIS — M6281 Muscle weakness (generalized): Secondary | ICD-10-CM

## 2024-03-16 DIAGNOSIS — R262 Difficulty in walking, not elsewhere classified: Secondary | ICD-10-CM

## 2024-03-16 NOTE — Therapy (Signed)
 OUTPATIENT PHYSICAL THERAPY NEURO TREATMENT   Patient Name: Megan Bradley MRN: [id number] DOB:19-Aug-1940, 83 y.o., female Today's Date: 03/16/2024   PCP: Megan Verneita CROME, MD  REFERRING PROVIDER: Marylynn Verneita CROME, MD  END OF SESSION:  PT End of Session - 03/16/24 1551     Visit Number 2    Number of Visits 24    Date for Recertification  06/06/24    Progress Note Due on Visit 10    PT Start Time 1318    PT Stop Time 1357    PT Time Calculation (min) 39 min    Equipment Utilized During Treatment Gait belt    Activity Tolerance Patient tolerated treatment well;No increased pain;Treatment limited secondary to medical complications (Comment)   Upper respiratory congestion   Behavior During Therapy [facility] for tasks assessed/performed           Past Medical History:  Diagnosis Date   Abnormal cervical Papanicolaou smear 01/05/2020   Overview:   a.  Pap smears q 4 to 6 months.       b.  LEEP procedure 04-2009.      Allergy COEDINE; OXY DRUGS   Anemia RECENTLY   Anxiety YES, QUITE DIFFICULT AT TIMES   Anxiety disorder    Aortic atherosclerosis    Arthritis SOME; JUST HAD KNEE REPLACEMENT   both knees   Breast disorder in female 10/14/2019   Cervical dysplasia    a. 2010 - high grade squamous intraepithelial lesion s/p excision.   Chest pain    a. 2006 or 2007 [facility]): reportedly nl cors;  b. 09/2011 ETT: nl.   Depression SOME PROBABLY   Diabetes mellitus without complication (HCC) CONTROLLED WITHOUT RX   DOE (dyspnea on exertion)    Family history of adverse reaction to anesthesia    grand daughter was hard to wake up after back surgery   GERD (gastroesophageal reflux disease) YES AND A JACKHAMMER ESOPHEGUS   Grade II diastolic dysfunction    Herpes zoster    Hyperlipidemia    Hypothyroidism    Mild aortic regurgitation    Nutcracker esophagus    Orthostatic hypotension    Pneumonia    Pre-diabetes    Ruptured left breast implant, initial encounter 07/05/2018    Skin cancer AT LEAST 15 YEARS AGO, AND CONTINUING,   Sleep apnea    does not use cpap, did not tolerate   Vertigo    Wears dentures    partial lower   Past Surgical History:  Procedure Laterality Date   AUGMENTATION MAMMAPLASTY Bilateral    BREAST EXCISIONAL BIOPSY Left    CARDIAC CATHETERIZATION  2003   Megan Bradley, R/L heart cath,   COLONOSCOPY WITH PROPOFOL  N/A 06/12/2017   Procedure: COLONOSCOPY WITH PROPOFOL ;  Surgeon: Megan Carmine, MD;  Location: [facility];  Service: Endoscopy;  Laterality: N/A;   dental work     ESOPHAGEAL MANOMETRY N/A 06/19/2016   Procedure: ESOPHAGEAL MANOMETRY (EM);  Surgeon: Bradley Jinny, MD;  Location: [facility];  Service: Endoscopy;  Laterality: N/A;   ESOPHAGOGASTRODUODENOSCOPY (EGD) WITH PROPOFOL  N/A 04/03/2017   Procedure: ESOPHAGOGASTRODUODENOSCOPY (EGD) WITH PROPOFOL ;  Surgeon: Megan Carmine, MD;  Location: [facility];  Service: Endoscopy;  Laterality: N/A;  diabetic - diet controlled   EYE SURGERY  CATARACTS REMOVED - 2   THYROIDECTOMY  05/15/2015   [facility]   TONSILLECTOMY     TOTAL KNEE ARTHROPLASTY Right 10/25/2020   Procedure: RIGHT TOTAL KNEE ARTHROPLASTY;  Surgeon: Megan Drivers, MD;  Location: [facility]  ORS;  Service: Orthopedics;  Laterality: Right;   Patient Active Problem List   Diagnosis Date Noted   Insomnia due to mental condition 02/12/2024   Schatzki's ring of distal esophagus 08/11/2023   Travel advice encounter 05/12/2023   Lumbar radiculopathy 07/29/2022   CKD stage 3 secondary to diabetes (HCC) 05/17/2022   Zenkers diverticulum 05/05/2022   Presbyesophagus 01/30/2022   Prurigo nodularis 07/30/2021   Abnormal breast finding 07/30/2021   MGUS (monoclonal gammopathy of unknown significance) 01/22/2021   Anemia 01/09/2021   S/P TKR (total knee replacement) using cement, right 10/25/2020   Preoperative clearance 09/18/2020   Aortic atherosclerosis 07/10/2020   Exertional dyspnea 05/20/2020   Balance problem  05/12/2020   History of recent fall 05/12/2020   Type 2 DM with diabetic neuropathy affecting both sides of body (HCC) 01/05/2020   Bilateral chronic knee pain 04/11/2018   S/P breast implant, silicone 03/11/2018   Osteoporosis of femur without pathological fracture 03/11/2018   Nutcracker esophagus 01/01/2018   Tubular adenoma of colon    Squamous cell skin cancer 07/21/2016   Dysphagia 04/08/2016   RBBB    Chest pain    OSA (obstructive sleep apnea) 11/22/2013   Hypovitaminosis D 01/11/2013   History of pernicious anemia 01/11/2013   Hyperlipidemia associated with type 2 diabetes mellitus (HCC) 09/04/2011   Multiple thyroid  nodules 08/27/2011   Cervical dysplasia 08/27/2011   Screening for breast cancer 03/13/2011   Iatrogenic hypothyroidism    GERD (gastroesophageal reflux disease)    Anxiety with obsessional features    Basal cell carcinoma of face 10/03/2010    ONSET DATE: 02/09/24  REFERRING DIAG: R26.89 (ICD-10-CM) - Balance problem   THERAPY DIAG:  Abnormality of gait and mobility  Difficulty in walking, not elsewhere classified  Muscle weakness (generalized)  Other abnormalities of gait and mobility  Unsteadiness on feet  Rationale for Evaluation and Treatment: Rehabilitation  SUBJECTIVE:                                                                                                                                                                                             SUBJECTIVE STATEMENT: Patient reports that she has been doing well and her and her husband Megan Bradley went to [place] class earlier today and learned about very interesting musical concepts.  Eval: Pt reports she has had PT previously. Pt has pinched nerve in her spine that she gets shots [facility] at [facility]. Pt balance is very challenging in dark environments. Pt has had multiple falls where she has no recall of why she fell. Pt has difficulty walking straight most of the time and  frequently feels  like she is going to fall. Pt also has difficulty turning. Pt reports no dizziness just general imbalance.  Pt accompanied by: self  PERTINENT HISTORY: Knee replacement, low back pain, DM, HLD,   PAIN:  Are you having pain? Has pain in back occasionally, none in seated position at present   PRECAUTIONS: None  RED FLAGS: None   WEIGHT BEARING RESTRICTIONS: No  FALLS: Has patient fallen in last 6 months? Yes. Number of falls 2-3  LIVING ENVIRONMENT: Lives with: lives with their spouse Lives in: House/apartment Stairs: yes but level entry and does not need to go upstairs in her home  Has following equipment at home: Walker - 2 wheeled and does not regularly use AD  PLOF: Independent and Independent with basic ADLs  PATIENT GOALS: Improve balance and reduce fall risk   OBJECTIVE:  Note: Objective measures were completed at Evaluation unless otherwise noted.  COGNITION: Overall cognitive status: Within functional limits for tasks assessed   SENSATION: Not tested  LOWER EXTREMITY ROM:   WNL for tasks assessed  LOWER EXTREMITY MMT:    MMT Right Eval Left Eval  Hip flexion 4 4  Hip extension    Hip abduction 4 4  Hip adduction 4+ 4+  Hip internal rotation    Hip external rotation    Knee flexion 4+ 4+  Knee extension 4+ 4+  Ankle dorsiflexion    Ankle plantarflexion    Ankle inversion    Ankle eversion    (Blank rows = not tested)   TRANSFERS: Sit to stand: Complete Independence  Assistive device utilized: knee valgus bilateral and None     Stand to sit: Complete Independence and knee valgus noted  Assistive device utilized: None     Chair to chair: Complete Independence  Assistive device utilized: None       RAMP:  Not tested  CURB:  Not tested  STAIRS: Not tested GAIT: Findings: Gait Characteristics: decreased arm swing- Right, decreased arm swing- Left, decreased step length- Right, decreased step length- Left, decreased stride length, decreased  hip/knee flexion- Right, and decreased hip/knee flexion- Left, Distance walked: 30 ft, and Comments: 1 LOB with step recovery   FUNCTIONAL TESTS:  MCTSIB: Condition 1: Avg of 3 trials: 30 sec, Condition 2: Avg of 3 trials: 30 sec, Condition 3: Avg of 3 trials: 30 sec, Condition 4: Avg of 3 trials: 15 sec, and Total Score: 105/120 5XSTS: 21.12 sec no UE assist, B knee valgus throughout  10 Meter Walk Test: Patient instructed to walk 10 meters (32.8 ft) as quickly and as safely as possible at their normal speed Results: .58 m/s (17.06 seconds )  Cut off scores:   Household Ambulator  < 0.4 m/s  Limited Community Ambulator  0.4 - 0.8 m/s  [employer]  > 0.8 m/s  Increased fall risk  < 1.4m/s  Crossing a Street  >1.31m/s  MCID 0.05 m/s (small), 0.13 m/s (moderate), 0.06 m/s (significant)  (ANPTA Core Set of Outcome Measures for Adults with Neurologic Conditions, 2018)       PATIENT SURVEYS:  ABC scale: The Activities-Specific Balance Confidence (ABC) Scale 0% 10 20 30  40 50 60 70 80 90 100% No confidence<->completely confident  "How confident are you that you will not lose your balance or become unsteady when you . . .   Date tested   Walk around the house 50%  2. Walk up or down stairs 70%  3. Bend over and pick up  a slipper from in front of a closet floor 60%  4. Reach for a small can off a shelf at eye level 90%  5. Stand on tip toes and reach for something above your head 70%  6. Stand on a chair and reach for something 90%  7. Sweep the floor 90%  8. Walk outside the house to a car parked in the driveway 50%  9. Get into or out of a car 60%  10. Walk across a parking lot to the mall 50%  11. Walk up or down a ramp 90%  12. Walk in a crowded mall where people rapidly walk past you 60%  13. Are bumped into by people as you walk through the mall 50%  14. Step onto or off of an escalator while you are holding onto the railing 50%  15. Step onto or off an escalator  while holding onto parcels such that you cannot hold onto the railing 0%  16. Walk outside on icy sidewalks 0%  Total: #/16 58.125%                                                                                                                                 TREATMENT DATE: 03/16/24   Patient arrived with good motivation for completion of pt activities.   Patient demonstrates increased fall risk as noted by score of  39 /56 on Berg Balance Scale.  (<36= high risk for falls, close to 100%; 37-45 significant >80%; 46-51 moderate >50%; 52-55 lower >25%)   [facility] PT Assessment - 03/16/24 0001       Standardized Balance Assessment   Standardized Balance Assessment Berg Balance Test      Berg Balance Test   Sit to Stand Able to stand  independently using hands    Standing Unsupported Able to stand safely 2 minutes    Sitting with Back Unsupported but Feet Supported on Floor or Stool Able to sit safely and securely 2 minutes    Stand to Sit Controls descent by using hands    Transfers Able to transfer safely, definite need of hands    Standing Unsupported with Eyes Closed Able to stand 10 seconds safely    Standing Unsupported with Feet Together Able to place feet together independently and stand for 1 minute with supervision    From Standing, Reach Forward with Outstretched Arm Can reach forward >12 cm safely (5)    From Standing Position, Pick up Object from Floor Able to pick up shoe, needs supervision    From Standing Position, Turn to Look Behind Over each Shoulder Looks behind from both sides and weight shifts well    Turn 360 Degrees Needs close supervision or verbal cueing    Standing Unsupported, Alternately Place Feet on Step/Stool Able to complete 4 steps without aid or supervision    Standing Unsupported, One Foot in Front Needs help to step but can hold 15 seconds  Standing on One Leg Tries to lift leg/unable to hold 3 seconds but remains standing independently    Total Score  39          TE- To improve strength, endurance, mobility, and function of specific targeted muscle groups or improve joint range of motion or improve muscle flexibility  Seated clamshell x 20 reps- too easy progressed to the below for HEP   Exercises - Side Stepping with Resistance at Ankles and Counter Support  - 1 x daily - 7 x weekly - 2 sets - 15 reps - Wide tandem with counter support as needed   - 1 x daily - 7 x weekly - 3 sets - 30 sec hold - Standing Balance with Eyes Closed  - 1 x daily - 7 x weekly - 3 sets - 30 sec hold - Sit to Stand  - 1 x daily - 7 x weekly - 2 sets - 8 reps      PATIENT EDUCATION: Education details: POC Person educated: Patient Education method: Explanation Education comprehension: verbalized understanding   HOME EXERCISE PROGRAM: Access Code: [email or web address] URL: [email or web address] Date: 03/16/2024 Prepared by: Lonni Gainer  Exercises - Side Stepping with Resistance at Ankles and Counter Support  - 1 x daily - 7 x weekly - 2 sets - 15 reps - Wide tandem with counter support as needed   - 1 x daily - 7 x weekly - 3 sets - 30 sec hold - Standing Balance with Eyes Closed  - 1 x daily - 7 x weekly - 3 sets - 30 sec hold - Sit to Stand  - 1 x daily - 7 x weekly - 2 sets - 8 reps   GOALS: Goals reviewed with patient? Yes  SHORT TERM GOALS: Target date: 04/11/2024     Patient will be independent in home exercise program to improve strength/mobility for better functional independence with ADLs. Baseline: No HEP currently  Goal status: INITIAL   LONG TERM GOALS: Target date: 06/06/2024   1.  Patient will complete five times sit to stand test in < 15 seconds indicating an increased LE strength and improved balance. Baseline: 21.12 sec no UE Goal status: INITIAL  2.  Patient will improve ABC score to 70%   to demonstrate statistically significant improvement in mobility and quality of life as it relates to their balance  confidence.  Baseline: 58.125% Goal status: INITIAL   3.  Patient will increase Berg Balance score by > 6 points to demonstrate decreased fall risk during functional activities. Baseline: TBD Goal status: INITIAL   4.   Patient will increase 10 meter walk test to >1.94m/s as to improve gait speed for better community ambulation and to reduce fall risk. Baseline: .58 m/s Goal status: INITIAL  5.   Patient will maintain stance in condition 4 of MCTSIB test for 30 sec on average of 3 trials in order to indicate improved balance in conditions of low of minimal light and compliant surface.  Baseline: 15 sec average of 3 trials  Goal status: INITIAL    ASSESSMENT:  CLINICAL IMPRESSION: Patient presents with good motivation for completion of physical therapy activities.  Patient completes Lars balance assessment and demonstrates significant balance impairments as evidenced by overall score on balance testing.  Patient also introduced to initial home exercise program focusing on hip strength as well as static balance balance activities.  Patient demonstrates good efficacy with form and was instructed to practice these between now and her next  session. Pt will continue to benefit from skilled physical therapy intervention to address impairments, improve QOL, and attain therapy goals.    OBJECTIVE IMPAIRMENTS: Abnormal gait, decreased activity tolerance, decreased balance, decreased mobility, difficulty walking, and decreased strength.   ACTIVITY LIMITATIONS: standing, squatting, stairs, transfers, and locomotion level  PARTICIPATION LIMITATIONS: community activity, occupation, and yard work  PERSONAL FACTORS: Age, Time since onset of injury/illness/exacerbation, and 3+ comorbidities: DM, Anxiety, DOE, GERD with Jackhammer esophagus are also affecting patient's functional outcome.   REHAB POTENTIAL: Good  CLINICAL DECISION MAKING: Evolving/moderate complexity  EVALUATION COMPLEXITY:  Moderate  PLAN:  PT FREQUENCY: 2x/week  PT DURATION: 12 weeks  PLANNED INTERVENTIONS: [id number]- Physical Performance Testing, 97110-Therapeutic exercises, 97530- Therapeutic activity, 97112- Neuromuscular re-education, 97535- Self Care, [id number]- Manual therapy, [phone]- Gait training, [phone]- Canalith repositioning, Patient/Family education, Balance training, Stair training, and Vestibular training  PLAN FOR NEXT SESSION: hip strength and balance   Lonni KATHEE Gainer, PT 03/16/2024, 3:52 PM

## 2024-03-21 ENCOUNTER — Ambulatory Visit: Admitting: Physical Therapy

## 2024-03-21 DIAGNOSIS — R269 Unspecified abnormalities of gait and mobility: Secondary | ICD-10-CM | POA: Diagnosis not present

## 2024-03-21 DIAGNOSIS — M6281 Muscle weakness (generalized): Secondary | ICD-10-CM

## 2024-03-21 DIAGNOSIS — R2681 Unsteadiness on feet: Secondary | ICD-10-CM

## 2024-03-21 DIAGNOSIS — R262 Difficulty in walking, not elsewhere classified: Secondary | ICD-10-CM

## 2024-03-21 DIAGNOSIS — R2689 Other abnormalities of gait and mobility: Secondary | ICD-10-CM

## 2024-03-21 NOTE — Therapy (Signed)
 OUTPATIENT PHYSICAL THERAPY NEURO TREATMENT   Patient Name: Megan Bradley MRN: [id number] DOB:[birth year], 83 y.o., female Today's Date: 03/21/2024   PCP: Marylynn Verneita CROME, MD  REFERRING PROVIDER: Marylynn Verneita CROME, MD  END OF SESSION:  PT End of Session - 03/21/24 1542     Visit Number 3    Number of Visits 24    Date for Recertification  06/06/24    Progress Note Due on Visit 10    PT Start Time 1536    PT Stop Time 1614    PT Time Calculation (min) 38 min    Equipment Utilized During Treatment Gait belt    Activity Tolerance Patient tolerated treatment well;No increased pain;Treatment limited secondary to medical complications (Comment)   Upper respiratory congestion   Behavior During Therapy [facility] for tasks assessed/performed            Past Medical History:  Diagnosis Date   Abnormal cervical Papanicolaou smear 01/05/2020   Overview:   a.  Pap smears q 4 to 6 months.       b.  LEEP procedure 04-2009.      Allergy COEDINE; OXY DRUGS   Anemia RECENTLY   Anxiety YES, QUITE DIFFICULT AT TIMES   Anxiety disorder    Aortic atherosclerosis    Arthritis SOME; JUST HAD KNEE REPLACEMENT   both knees   Breast disorder in female 10/14/2019   Cervical dysplasia    a. 2010 - high grade squamous intraepithelial lesion s/p excision.   Chest pain    a. 2006 or 2007 [facility]): reportedly nl cors;  b. 09/2011 ETT: nl.   Depression SOME PROBABLY   Diabetes mellitus without complication (HCC) CONTROLLED WITHOUT RX   DOE (dyspnea on exertion)    Family history of adverse reaction to anesthesia    grand daughter was hard to wake up after back surgery   GERD (gastroesophageal reflux disease) YES AND A JACKHAMMER ESOPHEGUS   Grade II diastolic dysfunction    Herpes zoster    Hyperlipidemia    Hypothyroidism    Mild aortic regurgitation    Nutcracker esophagus    Orthostatic hypotension    Pneumonia    Pre-diabetes    Ruptured left breast implant, initial encounter 07/05/2018    Skin cancer AT LEAST 15 YEARS AGO, AND CONTINUING,   Sleep apnea    does not use cpap, did not tolerate   Vertigo    Wears dentures    partial lower   Past Surgical History:  Procedure Laterality Date   AUGMENTATION MAMMAPLASTY Bilateral    BREAST EXCISIONAL BIOPSY Left    CARDIAC CATHETERIZATION  2003   Dr.Callwood, R/L heart cath,   COLONOSCOPY WITH PROPOFOL  N/A 06/12/2017   Procedure: COLONOSCOPY WITH PROPOFOL ;  Surgeon: Jinny Carmine, MD;  Location: [facility];  Service: Endoscopy;  Laterality: N/A;   dental work     ESOPHAGEAL MANOMETRY N/A 06/19/2016   Procedure: ESOPHAGEAL MANOMETRY (EM);  Surgeon: Carmine Jinny, MD;  Location: [facility];  Service: Endoscopy;  Laterality: N/A;   ESOPHAGOGASTRODUODENOSCOPY (EGD) WITH PROPOFOL  N/A 04/03/2017   Procedure: ESOPHAGOGASTRODUODENOSCOPY (EGD) WITH PROPOFOL ;  Surgeon: Jinny Carmine, MD;  Location: [facility];  Service: Endoscopy;  Laterality: N/A;  diabetic - diet controlled   EYE SURGERY  CATARACTS REMOVED - 2   THYROIDECTOMY  05/15/2015   [facility]   TONSILLECTOMY     TOTAL KNEE ARTHROPLASTY Right 10/25/2020   Procedure: RIGHT TOTAL KNEE ARTHROPLASTY;  Surgeon: Marchia Drivers, MD;  Location:  [facility];  Service: Orthopedics;  Laterality: Right;   Patient Active Problem List   Diagnosis Date Noted   Insomnia due to mental condition 02/12/2024   Schatzki's ring of distal esophagus 08/11/2023   Travel advice encounter 05/12/2023   Lumbar radiculopathy 07/29/2022   CKD stage 3 secondary to diabetes (HCC) 05/17/2022   Zenkers diverticulum 05/05/2022   Presbyesophagus 01/30/2022   Prurigo nodularis 07/30/2021   Abnormal breast finding 07/30/2021   MGUS (monoclonal gammopathy of unknown significance) 01/22/2021   Anemia 01/09/2021   S/P TKR (total knee replacement) using cement, right 10/25/2020   Preoperative clearance 09/18/2020   Aortic atherosclerosis 07/10/2020   Exertional dyspnea 05/20/2020   Balance  problem 05/12/2020   History of recent fall 05/12/2020   Type 2 DM with diabetic neuropathy affecting both sides of body (HCC) 01/05/2020   Bilateral chronic knee pain 04/11/2018   S/P breast implant, silicone 03/11/2018   Osteoporosis of femur without pathological fracture 03/11/2018   Nutcracker esophagus 01/01/2018   Tubular adenoma of colon    Squamous cell skin cancer 07/21/2016   Dysphagia 04/08/2016   RBBB    Chest pain    OSA (obstructive sleep apnea) 11/22/2013   Hypovitaminosis D 01/11/2013   History of pernicious anemia 01/11/2013   Hyperlipidemia associated with type 2 diabetes mellitus (HCC) 09/04/2011   Multiple thyroid  nodules 08/27/2011   Cervical dysplasia 08/27/2011   Screening for breast cancer 03/13/2011   Iatrogenic hypothyroidism    GERD (gastroesophageal reflux disease)    Anxiety with obsessional features    Basal cell carcinoma of face 10/03/2010    ONSET DATE: 02/09/24  REFERRING DIAG: R26.89 (ICD-10-CM) - Balance problem   THERAPY DIAG:  Abnormality of gait and mobility  Difficulty in walking, not elsewhere classified  Muscle weakness (generalized)  Other abnormalities of gait and mobility  Unsteadiness on feet  Rationale for Evaluation and Treatment: Rehabilitation  SUBJECTIVE:                                                                                                                                                                                             SUBJECTIVE STATEMENT: Patient complains of back pain at start of session; is getting injection in about a week and pain has been flaring up.   Eval: Pt reports she has had PT previously. Pt has pinched nerve in her spine that she gets shots for at [facility]. Pt balance is very challenging in dark environments. Pt has had multiple falls where she has no recall of why she fell. Pt has difficulty walking straight most of the time and frequently feels like  she is going to fall. Pt also  has difficulty turning. Pt reports no dizziness just general imbalance.  Pt accompanied by: self  PERTINENT HISTORY: Knee replacement, low back pain, DM, HLD,   PAIN:  Are you having pain? Has pain in back occasionally, none in seated position at present   PRECAUTIONS: None  RED FLAGS: None   WEIGHT BEARING RESTRICTIONS: No  FALLS: Has patient fallen in last 6 months? Yes. Number of falls 2-3  LIVING ENVIRONMENT: Lives with: lives with their spouse Lives in: House/apartment Stairs: yes but level entry and does not need to go upstairs in her home  Has following equipment at home: Walker - 2 wheeled and does not regularly use AD  PLOF: Independent and Independent with basic ADLs  PATIENT GOALS: Improve balance and reduce fall risk   OBJECTIVE:  Note: Objective measures were completed at Evaluation unless otherwise noted.  COGNITION: Overall cognitive status: Within functional limits for tasks assessed   SENSATION: Not tested  LOWER EXTREMITY ROM:   WNL for tasks assessed  LOWER EXTREMITY MMT:    MMT Right Eval Left Eval  Hip flexion 4 4  Hip extension    Hip abduction 4 4  Hip adduction 4+ 4+  Hip internal rotation    Hip external rotation    Knee flexion 4+ 4+  Knee extension 4+ 4+  Ankle dorsiflexion    Ankle plantarflexion    Ankle inversion    Ankle eversion    (Blank rows = not tested)   TRANSFERS: Sit to stand: Complete Independence  Assistive device utilized: knee valgus bilateral and None     Stand to sit: Complete Independence and knee valgus noted  Assistive device utilized: None     Chair to chair: Complete Independence  Assistive device utilized: None       RAMP:  Not tested  CURB:  Not tested  STAIRS: Not tested GAIT: Findings: Gait Characteristics: decreased arm swing- Right, decreased arm swing- Left, decreased step length- Right, decreased step length- Left, decreased stride length, decreased hip/knee flexion- Right, and decreased  hip/knee flexion- Left, Distance walked: 30 ft, and Comments: 1 LOB with step recovery   FUNCTIONAL TESTS:  MCTSIB: Condition 1: Avg of 3 trials: 30 sec, Condition 2: Avg of 3 trials: 30 sec, Condition 3: Avg of 3 trials: 30 sec, Condition 4: Avg of 3 trials: 15 sec, and Total Score: 105/120 5XSTS: 21.12 sec no UE assist, B knee valgus throughout  10 Meter Walk Test: Patient instructed to walk 10 meters (32.8 ft) as quickly and as safely as possible at their normal speed Results: .58 m/s (17.06 seconds )  Cut off scores:   Household Ambulator  < 0.4 m/s  Limited Community Ambulator  0.4 - 0.8 m/s  [employer]  > 0.8 m/s  Increased fall risk  < 1.49m/s  Crossing a Street  >1.67m/s  MCID 0.05 m/s (small), 0.13 m/s (moderate), 0.06 m/s (significant)  (ANPTA Core Set of Outcome Measures for Adults with Neurologic Conditions, 2018)       PATIENT SURVEYS:  ABC scale: The Activities-Specific Balance Confidence (ABC) Scale 0% 10 20 30  40 50 60 70 80 90 100% No confidence<->completely confident  "How confident are you that you will not lose your balance or become unsteady when you . . .   Date tested   Walk around the house 50%  2. Walk up or down stairs 70%  3. Bend over and pick up a slipper from  in front of a closet floor 60%  4. Reach for a small can off a shelf at eye level 90%  5. Stand on tip toes and reach for something above your head 70%  6. Stand on a chair and reach for something 90%  7. Sweep the floor 90%  8. Walk outside the house to a car parked in the driveway 50%  9. Get into or out of a car 60%  10. Walk across a parking lot to the mall 50%  11. Walk up or down a ramp 90%  12. Walk in a crowded mall where people rapidly walk past you 60%  13. Are bumped into by people as you walk through the mall 50%  14. Step onto or off of an escalator while you are holding onto the railing 50%  15. Step onto or off an escalator while holding onto parcels such that  you cannot hold onto the railing 0%  16. Walk outside on icy sidewalks 0%  Total: #/16 58.125%                                                                                                                                 TREATMENT DATE: 03/21/24  TA- To improve functional movements patterns for everyday tasks    Nustep level 3 x 6 min with moist heat pack applied ot lumbar region throughout for pain modulation and preparation for other activities and for B UE and lE reciprocal movement training.    NMR: To facilitate reeducation of movement, balance, posture, coordination, and/or proprioception/kinesthetic sense.  Balance course of 2 x 1/2 foam rollers, orange hurdles, and airex pad x 4 laps - airex most challenging, does swing R LE around hurdle on a few reps   Wide tandem EC stance 2 x 30 sec ea  Step taps 2 x 10 reps   NBOS on airex EC 3 x 30 sec   STS on airex x 10 - UE assist on a few reps      PATIENT EDUCATION: Education details: POC Person educated: Patient Education method: Explanation Education comprehension: verbalized understanding   HOME EXERCISE PROGRAM: Access Code: [email or web address] URL: [email or web address] Date: 03/16/2024 Prepared by: Lonni Gainer  Exercises - Side Stepping with Resistance at Ankles and Counter Support  - 1 x daily - 7 x weekly - 2 sets - 15 reps - Wide tandem with counter support as needed   - 1 x daily - 7 x weekly - 3 sets - 30 sec hold - Standing Balance with Eyes Closed  - 1 x daily - 7 x weekly - 3 sets - 30 sec hold - Sit to Stand  - 1 x daily - 7 x weekly - 2 sets - 8 reps   GOALS: Goals reviewed with patient? Yes  SHORT TERM GOALS: Target date: 04/11/2024     Patient will be independent in home exercise program to improve strength/mobility for better  functional independence with ADLs. Baseline: No HEP currently  Goal status: INITIAL   LONG TERM GOALS: Target date: 06/06/2024   1.  Patient will  complete five times sit to stand test in < 15 seconds indicating an increased LE strength and improved balance. Baseline: 21.12 sec no UE Goal status: INITIAL  2.  Patient will improve ABC score to 70%   to demonstrate statistically significant improvement in mobility and quality of life as it relates to their balance confidence.  Baseline: 58.125% Goal status: INITIAL   3.  Patient will increase Berg Balance score by > 6 points to demonstrate decreased fall risk during functional activities. Baseline: TBD Goal status: INITIAL   4.   Patient will increase 10 meter walk test to >1.75m/s as to improve gait speed for better community ambulation and to reduce fall risk. Baseline: .58 m/s Goal status: INITIAL  5.   Patient will maintain stance in condition 4 of MCTSIB test for 30 sec on average of 3 trials in order to indicate improved balance in conditions of low of minimal light and compliant surface.  Baseline: 15 sec average of 3 trials  Goal status: INITIAL    ASSESSMENT:  CLINICAL IMPRESSION: Patient presents with good motivation for completion of physical therapy activities.  Patient completes Lars balance assessment and demonstrates significant balance impairments as evidenced by overall score on balance testing.  Patient also introduced to initial home exercise program focusing on hip strength as well as static balance balance activities.  Patient demonstrates good efficacy with form and was instructed to practice these between now and her next session. Pt will continue to benefit from skilled physical therapy intervention to address impairments, improve QOL, and attain therapy goals.    OBJECTIVE IMPAIRMENTS: Abnormal gait, decreased activity tolerance, decreased balance, decreased mobility, difficulty walking, and decreased strength.   ACTIVITY LIMITATIONS: standing, squatting, stairs, transfers, and locomotion level  PARTICIPATION LIMITATIONS: community activity, occupation,  and yard work  PERSONAL FACTORS: Age, Time since onset of injury/illness/exacerbation, and 3+ comorbidities: DM, Anxiety, DOE, GERD with Jackhammer esophagus are also affecting patient's functional outcome.   REHAB POTENTIAL: Good  CLINICAL DECISION MAKING: Evolving/moderate complexity  EVALUATION COMPLEXITY: Moderate  PLAN:  PT FREQUENCY: 2x/week  PT DURATION: 12 weeks  PLANNED INTERVENTIONS: 97750- Physical Performance Testing, 97110-Therapeutic exercises, 97530- Therapeutic activity, 97112- Neuromuscular re-education, 97535- Self Care, [id number]- Manual therapy, [phone]- Gait training, [phone]- Canalith repositioning, Patient/Family education, Balance training, Stair training, and Vestibular training  PLAN FOR NEXT SESSION: hip strength and balance   Lonni KATHEE Gainer, PT 03/21/2024, 4:10 PM

## 2024-03-23 ENCOUNTER — Ambulatory Visit: Admitting: Physical Therapy

## 2024-03-23 DIAGNOSIS — M6281 Muscle weakness (generalized): Secondary | ICD-10-CM

## 2024-03-23 DIAGNOSIS — R2681 Unsteadiness on feet: Secondary | ICD-10-CM

## 2024-03-23 DIAGNOSIS — R269 Unspecified abnormalities of gait and mobility: Secondary | ICD-10-CM | POA: Diagnosis not present

## 2024-03-23 DIAGNOSIS — R262 Difficulty in walking, not elsewhere classified: Secondary | ICD-10-CM

## 2024-03-23 DIAGNOSIS — R2689 Other abnormalities of gait and mobility: Secondary | ICD-10-CM

## 2024-03-23 NOTE — Therapy (Signed)
 OUTPATIENT PHYSICAL THERAPY NEURO TREATMENT   Patient Name: Megan Bradley MRN: [id number] DOB:1940-12-13, 83 y.o., female Today's Date: 03/23/2024   PCP: Marylynn Verneita CROME, MD  REFERRING PROVIDER: Marylynn Verneita CROME, MD  END OF SESSION:  PT End of Session - 03/23/24 1503     Visit Number 4    Number of Visits 24    Date for Recertification  06/06/24    Progress Note Due on Visit 10    PT Start Time 1451    PT Stop Time 1530    PT Time Calculation (min) 39 min    Equipment Utilized During Treatment Gait belt    Activity Tolerance Patient tolerated treatment well;No increased pain;Treatment limited secondary to medical complications (Comment)   Upper respiratory congestion   Behavior During Therapy [facility] for tasks assessed/performed            Past Medical History:  Diagnosis Date   Abnormal cervical Papanicolaou smear 01/05/2020   Overview:   a.  Pap smears q 4 to 6 months.       b.  LEEP procedure 04-2009.      Allergy COEDINE; OXY DRUGS   Anemia RECENTLY   Anxiety YES, QUITE DIFFICULT AT TIMES   Anxiety disorder    Aortic atherosclerosis    Arthritis SOME; JUST HAD KNEE REPLACEMENT   both knees   Breast disorder in female 10/14/2019   Cervical dysplasia    a. 2010 - high grade squamous intraepithelial lesion s/p excision.   Chest pain    a. 2006 or 2007 [facility] reportedly nl cors;  b. 09/2011 [facility]: nl.   Depression SOME PROBABLY   Diabetes mellitus without complication (HCC) CONTROLLED WITHOUT RX   DOE (dyspnea on exertion)    Family history of adverse reaction to anesthesia    grand daughter was hard to wake up after back surgery   GERD (gastroesophageal reflux disease) YES AND A JACKHAMMER ESOPHEGUS   Grade II diastolic dysfunction    Herpes zoster    Hyperlipidemia    Hypothyroidism    Mild aortic regurgitation    Nutcracker esophagus    Orthostatic hypotension    Pneumonia    Pre-diabetes    Ruptured left breast implant, initial encounter 07/05/2018    Skin cancer AT LEAST 15 YEARS AGO, AND CONTINUING,   Sleep apnea    does not use cpap, did not tolerate   Vertigo    Wears dentures    partial lower   Past Surgical History:  Procedure Laterality Date   AUGMENTATION MAMMAPLASTY Bilateral    BREAST EXCISIONAL BIOPSY Left    CARDIAC CATHETERIZATION  2003   Dr.Callwood, R/L heart cath,   COLONOSCOPY WITH PROPOFOL  N/A 06/12/2017   Procedure: COLONOSCOPY WITH PROPOFOL ;  Surgeon: Jinny Carmine, MD;  Location: [facility];  Service: Endoscopy;  Laterality: N/A;   dental work     ESOPHAGEAL MANOMETRY N/A 06/19/2016   Procedure: ESOPHAGEAL MANOMETRY (EM);  Surgeon: Carmine Jinny, MD;  Location: [facility];  Service: Endoscopy;  Laterality: N/A;   ESOPHAGOGASTRODUODENOSCOPY (EGD) WITH PROPOFOL  N/A 04/03/2017   Procedure: ESOPHAGOGASTRODUODENOSCOPY (EGD) WITH PROPOFOL ;  Surgeon: Jinny Carmine, MD;  Location: [facility];  Service: Endoscopy;  Laterality: N/A;  diabetic - diet controlled   EYE SURGERY  CATARACTS REMOVED - 2   THYROIDECTOMY  05/15/2015   [facility]   TONSILLECTOMY     TOTAL KNEE ARTHROPLASTY Right 10/25/2020   Procedure: RIGHT TOTAL KNEE ARTHROPLASTY;  Surgeon: Marchia Drivers, MD;  Location:  [facility];  Service: Orthopedics;  Laterality: Right;   Patient Active Problem List   Diagnosis Date Noted   Insomnia due to mental condition 02/12/2024   Schatzki's ring of distal esophagus 08/11/2023   Travel advice encounter 05/12/2023   Lumbar radiculopathy 07/29/2022   CKD stage 3 secondary to diabetes (HCC) 05/17/2022   Zenkers diverticulum 05/05/2022   Presbyesophagus 01/30/2022   Prurigo nodularis 07/30/2021   Abnormal breast finding 07/30/2021   MGUS (monoclonal gammopathy of unknown significance) 01/22/2021   Anemia 01/09/2021   S/P TKR (total knee replacement) using cement, right 10/25/2020   Preoperative clearance 09/18/2020   Aortic atherosclerosis 07/10/2020   Exertional dyspnea 05/20/2020   Balance  problem 05/12/2020   History of recent fall 05/12/2020   Type 2 DM with diabetic neuropathy affecting both sides of body (HCC) 01/05/2020   Bilateral chronic knee pain 04/11/2018   S/P breast implant, silicone 03/11/2018   Osteoporosis of femur without pathological fracture 03/11/2018   Nutcracker esophagus 01/01/2018   Tubular adenoma of colon    Squamous cell skin cancer 07/21/2016   Dysphagia 04/08/2016   RBBB    Chest pain    OSA (obstructive sleep apnea) 11/22/2013   Hypovitaminosis D 01/11/2013   History of pernicious anemia 01/11/2013   Hyperlipidemia associated with type 2 diabetes mellitus (HCC) 09/04/2011   Multiple thyroid  nodules 08/27/2011   Cervical dysplasia 08/27/2011   Screening for breast cancer 03/13/2011   Iatrogenic hypothyroidism    GERD (gastroesophageal reflux disease)    Anxiety with obsessional features    Basal cell carcinoma of face 10/03/2010    ONSET DATE: 02/09/24  REFERRING DIAG: R26.89 (ICD-10-CM) - Balance problem   THERAPY DIAG:  Abnormality of gait and mobility  Difficulty in walking, not elsewhere classified  Muscle weakness (generalized)  Other abnormalities of gait and mobility  Unsteadiness on feet  Rationale for Evaluation and Treatment: Rehabilitation  SUBJECTIVE:                                                                                                                                                                                             SUBJECTIVE STATEMENT: Patient complains of back pain at start of session; is better than last session but still present.   Eval: Pt reports she has had PT previously. Pt has pinched nerve in her spine that she [facility] shots [facility]. Pt balance is very challenging in dark environments. Pt has had multiple falls where she has no recall of why she fell. Pt has difficulty walking straight most of the time and frequently feels like she is going to fall.  Pt also has difficulty  turning. Pt reports no dizziness just general imbalance.  Pt accompanied by: self  PERTINENT HISTORY: Knee replacement, low back pain, DM, HLD,   PAIN:  Are you having pain? Has pain in back occasionally, none in seated position at present   PRECAUTIONS: None  RED FLAGS: None   WEIGHT BEARING RESTRICTIONS: No  FALLS: Has patient fallen in last 6 months? Yes. Number of falls 2-3  LIVING ENVIRONMENT: Lives with: lives with their spouse Lives in: House/apartment Stairs: yes but level entry and does not need to go upstairs in her home  Has following equipment at home: Walker - 2 wheeled and does not regularly use AD  PLOF: Independent and Independent with basic ADLs  PATIENT GOALS: Improve balance and reduce fall risk   OBJECTIVE:  Note: Objective measures were completed at Evaluation unless otherwise noted.  COGNITION: Overall cognitive status: Within functional limits for tasks assessed   SENSATION: Not tested  LOWER EXTREMITY ROM:   WNL for tasks assessed  LOWER EXTREMITY MMT:    MMT Right Eval Left Eval  Hip flexion 4 4  Hip extension    Hip abduction 4 4  Hip adduction 4+ 4+  Hip internal rotation    Hip external rotation    Knee flexion 4+ 4+  Knee extension 4+ 4+  Ankle dorsiflexion    Ankle plantarflexion    Ankle inversion    Ankle eversion    (Blank rows = not tested)   TRANSFERS: Sit to stand: Complete Independence  Assistive device utilized: knee valgus bilateral and None     Stand to sit: Complete Independence and knee valgus noted  Assistive device utilized: None     Chair to chair: Complete Independence  Assistive device utilized: None       RAMP:  Not tested  CURB:  Not tested  STAIRS: Not tested GAIT: Findings: Gait Characteristics: decreased arm swing- Right, decreased arm swing- Left, decreased step length- Right, decreased step length- Left, decreased stride length, decreased hip/knee flexion- Right, and decreased hip/knee  flexion- Left, Distance walked: 30 ft, and Comments: 1 LOB with step recovery   FUNCTIONAL TESTS:  MCTSIB: Condition 1: Avg of 3 trials: 30 sec, Condition 2: Avg of 3 trials: 30 sec, Condition 3: Avg of 3 trials: 30 sec, Condition 4: Avg of 3 trials: 15 sec, and Total Score: 105/120 5XSTS: 21.12 sec no UE assist, B knee valgus throughout  10 Meter Walk Test: Patient instructed to walk 10 meters (32.8 ft) as quickly and as safely as possible at their normal speed Results: .58 m/s (17.06 seconds )  Cut off scores:   Household Ambulator  < 0.4 m/s  Limited Community Ambulator  0.4 - 0.8 m/s  [employer]  > 0.8 m/s  Increased fall risk  < 1.75m/s  Crossing a Street  >1.21m/s  MCID 0.05 m/s (small), 0.13 m/s (moderate), 0.06 m/s (significant)  (ANPTA Core Set of Outcome Measures for Adults with Neurologic Conditions, 2018)       PATIENT SURVEYS:  ABC scale: The Activities-Specific Balance Confidence (ABC) Scale 0% 10 20 30  40 50 60 70 80 90 100% No confidence<->completely confident  "How confident are you that you will not lose your balance or become unsteady when you . . .   Date tested   Walk around the house 50%  2. Walk up or down stairs 70%  3. Bend over and pick up a slipper from in front of a closet  floor 60%  4. Reach for a small can off a shelf at eye level 90%  5. Stand on tip toes and reach for something above your head 70%  6. Stand on a chair and reach for something 90%  7. Sweep the floor 90%  8. Walk outside the house to a car parked in the driveway 50%  9. Get into or out of a car 60%  10. Walk across a parking lot to the mall 50%  11. Walk up or down a ramp 90%  12. Walk in a crowded mall where people rapidly walk past you 60%  13. Are bumped into by people as you walk through the mall 50%  14. Step onto or off of an escalator while you are holding onto the railing 50%  15. Step onto or off an escalator while holding onto parcels such that you cannot  hold onto the railing 0%  16. Walk outside on icy sidewalks 0%  Total: #/16 58.125%                                                                                                                                 TREATMENT DATE: 03/23/24   TA- To improve functional movements patterns for everyday tasks   Nustep rolling hills setting level 3-8 x 8 min with moist heat pack applied ot lumbar region throughout for pain modulation and preparation for other activities and for B UE and lE reciprocal movement training.   Side step ups to step trainer with one riser 2 x 10 ea with support    NMR: To facilitate reeducation of movement, balance, posture, coordination, and/or proprioception/kinesthetic sense.  Balance course of 2 x 1/2 foam rollers, orange hurdles, step trainer with riser and airex pad x 4 laps - airex most challenging, does swing R LE around hurdle on a few reps  X 4 laps through  Step taps 2 x 10 reps   NBOS on airex EC 3 x 30 sec   Walking across red mat for sand initial simulation x 10 rounds      PATIENT EDUCATION: Education details: POC Person educated: Patient Education method: Explanation Education comprehension: verbalized understanding   HOME EXERCISE PROGRAM: Access Code: [email or web address] URL: [email or web address] Date: 03/16/2024 Prepared by: Lonni Gainer  Exercises - Side Stepping with Resistance at Ankles and Counter Support  - 1 x daily - 7 x weekly - 2 sets - 15 reps - Wide tandem with counter support as needed   - 1 x daily - 7 x weekly - 3 sets - 30 sec hold - Standing Balance with Eyes Closed  - 1 x daily - 7 x weekly - 3 sets - 30 sec hold - Sit to Stand  - 1 x daily - 7 x weekly - 2 sets - 8 reps   GOALS: Goals reviewed with patient? Yes  SHORT TERM GOALS: Target date: 04/11/2024     Patient  will be independent in home exercise program to improve strength/mobility for better functional independence with ADLs. Baseline: No HEP  currently  Goal status: INITIAL   LONG TERM GOALS: Target date: 06/06/2024   1.  Patient will complete five times sit to stand test in < 15 seconds indicating an increased LE strength and improved balance. Baseline: 21.12 sec no UE Goal status: INITIAL  2.  Patient will improve ABC score to 70%   to demonstrate statistically significant improvement in mobility and quality of life as it relates to their balance confidence.  Baseline: 58.125% Goal status: INITIAL   3.  Patient will increase Berg Balance score by > 6 points to demonstrate decreased fall risk during functional activities. Baseline: TBD Goal status: INITIAL   4.   Patient will increase 10 meter walk test to >1.70m/s as to improve gait speed for better community ambulation and to reduce fall risk. Baseline: .58 m/s Goal status: INITIAL  5.   Patient will maintain stance in condition 4 of MCTSIB test for 30 sec on average of 3 trials in order to indicate improved balance in conditions of low of minimal light and compliant surface.  Baseline: 15 sec average of 3 trials  Goal status: INITIAL    ASSESSMENT:  CLINICAL IMPRESSION:  Patient presents with good motivation for completion of physical therapy activities.  Pt responds well to MHP on back on nustep providing relief for low back for better tolerance of later activities. Pt has most trouble with EC balance and with dynamic airex pad activities.  Patient demonstrates good efficacy with form and was instructed to practice these between now and her next session. Pt will continue to benefit from skilled physical therapy intervention to address impairments, improve QOL, and attain therapy goals.    OBJECTIVE IMPAIRMENTS: Abnormal gait, decreased activity tolerance, decreased balance, decreased mobility, difficulty walking, and decreased strength.   ACTIVITY LIMITATIONS: standing, squatting, stairs, transfers, and locomotion level  PARTICIPATION LIMITATIONS: community  activity, occupation, and yard work  PERSONAL FACTORS: Age, Time since onset of injury/illness/exacerbation, and 3+ comorbidities: DM, Anxiety, DOE, GERD with Jackhammer esophagus are also affecting patient's functional outcome.   REHAB POTENTIAL: Good  CLINICAL DECISION MAKING: Evolving/moderate complexity  EVALUATION COMPLEXITY: Moderate  PLAN:  PT FREQUENCY: 2x/week  PT DURATION: 12 weeks  PLANNED INTERVENTIONS: 97750- Physical Performance Testing, 97110-Therapeutic exercises, 97530- Therapeutic activity, 97112- Neuromuscular re-education, 97535- Self Care, [id number]- Manual therapy, [phone]- Gait training, [phone]- Canalith repositioning, Patient/Family education, Balance training, Stair training, and Vestibular training  PLAN FOR NEXT SESSION: hip strength and balance   Lonni KATHEE Gainer, PT 03/23/2024, 4:05 PM

## 2024-03-24 DIAGNOSIS — D2272 Melanocytic nevi of left lower limb, including hip: Secondary | ICD-10-CM | POA: Diagnosis not present

## 2024-03-24 DIAGNOSIS — L03011 Cellulitis of right finger: Secondary | ICD-10-CM | POA: Diagnosis not present

## 2024-03-24 DIAGNOSIS — D2261 Melanocytic nevi of right upper limb, including shoulder: Secondary | ICD-10-CM | POA: Diagnosis not present

## 2024-03-24 DIAGNOSIS — D2271 Melanocytic nevi of right lower limb, including hip: Secondary | ICD-10-CM | POA: Diagnosis not present

## 2024-03-24 DIAGNOSIS — L538 Other specified erythematous conditions: Secondary | ICD-10-CM | POA: Diagnosis not present

## 2024-03-24 DIAGNOSIS — L821 Other seborrheic keratosis: Secondary | ICD-10-CM | POA: Diagnosis not present

## 2024-03-24 DIAGNOSIS — D225 Melanocytic nevi of trunk: Secondary | ICD-10-CM | POA: Diagnosis not present

## 2024-03-24 DIAGNOSIS — L820 Inflamed seborrheic keratosis: Secondary | ICD-10-CM | POA: Diagnosis not present

## 2024-03-24 DIAGNOSIS — L281 Prurigo nodularis: Secondary | ICD-10-CM | POA: Diagnosis not present

## 2024-03-24 DIAGNOSIS — D2262 Melanocytic nevi of left upper limb, including shoulder: Secondary | ICD-10-CM | POA: Diagnosis not present

## 2024-03-24 DIAGNOSIS — L570 Actinic keratosis: Secondary | ICD-10-CM | POA: Diagnosis not present

## 2024-03-28 ENCOUNTER — Ambulatory Visit: Admitting: Physical Therapy

## 2024-03-28 DIAGNOSIS — R269 Unspecified abnormalities of gait and mobility: Secondary | ICD-10-CM

## 2024-03-28 DIAGNOSIS — R262 Difficulty in walking, not elsewhere classified: Secondary | ICD-10-CM

## 2024-03-28 DIAGNOSIS — R2689 Other abnormalities of gait and mobility: Secondary | ICD-10-CM

## 2024-03-28 DIAGNOSIS — M6281 Muscle weakness (generalized): Secondary | ICD-10-CM

## 2024-03-28 DIAGNOSIS — R2681 Unsteadiness on feet: Secondary | ICD-10-CM

## 2024-03-28 NOTE — Therapy (Signed)
 OUTPATIENT PHYSICAL THERAPY NEURO TREATMENT   Patient Name: Megan Bradley MRN: [id number] DOB:06-30-1940, 83 y.o., female Today's Date: 03/28/2024   PCP: Marylynn Verneita CROME, MD  REFERRING PROVIDER: Marylynn Verneita CROME, MD  END OF SESSION:  PT End of Session - 03/28/24 1501     Visit Number 5    Number of Visits 24    Date for Recertification  06/06/24    Progress Note Due on Visit 10    PT Start Time 0251    PT Stop Time 0329    PT Time Calculation (min) 38 min    Equipment Utilized During Treatment Gait belt    Activity Tolerance Patient tolerated treatment well;No increased pain;Treatment limited secondary to medical complications (Comment)   Upper respiratory congestion   Behavior During Therapy [facility] for tasks assessed/performed             Past Medical History:  Diagnosis Date   Abnormal cervical Papanicolaou smear 01/05/2020   Overview:   a.  Pap smears q 4 to 6 months.       b.  LEEP procedure 04-2009.      Allergy COEDINE; OXY DRUGS   Anemia RECENTLY   Anxiety YES, QUITE DIFFICULT AT TIMES   Anxiety disorder    Aortic atherosclerosis    Arthritis SOME; JUST HAD KNEE REPLACEMENT   both knees   Breast disorder in female 10/14/2019   Cervical dysplasia    a. 2010 - high grade squamous intraepithelial lesion s/p excision.   Chest pain    a. 2006 or 2007 [facility]): reportedly nl cors;  b. 09/2011 ETT: nl.   Depression SOME PROBABLY   Diabetes mellitus without complication (HCC) CONTROLLED WITHOUT RX   DOE (dyspnea on exertion)    Family history of adverse reaction to anesthesia    grand daughter was hard to wake up after back surgery   GERD (gastroesophageal reflux disease) YES AND A JACKHAMMER ESOPHEGUS   Grade II diastolic dysfunction    Herpes zoster    Hyperlipidemia    Hypothyroidism    Mild aortic regurgitation    Nutcracker esophagus    Orthostatic hypotension    Pneumonia    Pre-diabetes    Ruptured left breast implant, initial encounter  07/05/2018   Skin cancer AT LEAST 15 YEARS AGO, AND CONTINUING,   Sleep apnea    does not use cpap, did not tolerate   Vertigo    Wears dentures    partial lower   Past Surgical History:  Procedure Laterality Date   AUGMENTATION MAMMAPLASTY Bilateral    BREAST EXCISIONAL BIOPSY Left    CARDIAC CATHETERIZATION  2003   Dr.Callwood, R/L heart cath,   COLONOSCOPY WITH PROPOFOL  N/A 06/12/2017   Procedure: COLONOSCOPY WITH PROPOFOL ;  Surgeon: Jinny Carmine, MD;  Location: [facility];  Service: Endoscopy;  Laterality: N/A;   dental work     ESOPHAGEAL MANOMETRY N/A 06/19/2016   Procedure: ESOPHAGEAL MANOMETRY (EM);  Surgeon: Carmine Jinny, MD;  Location: [facility];  Service: Endoscopy;  Laterality: N/A;   ESOPHAGOGASTRODUODENOSCOPY (EGD) WITH PROPOFOL  N/A 04/03/2017   Procedure: ESOPHAGOGASTRODUODENOSCOPY (EGD) WITH PROPOFOL ;  Surgeon: Jinny Carmine, MD;  Location: [facility];  Service: Endoscopy;  Laterality: N/A;  diabetic - diet controlled   EYE SURGERY  CATARACTS REMOVED - 2   THYROIDECTOMY  05/15/2015   [facility]   TONSILLECTOMY     TOTAL KNEE ARTHROPLASTY Right 10/25/2020   Procedure: RIGHT TOTAL KNEE ARTHROPLASTY;  Surgeon: Marchia Drivers, MD;  Location: [facility];  Service: Orthopedics;  Laterality: Right;   Patient Active Problem List   Diagnosis Date Noted   Insomnia due to mental condition 02/12/2024   Schatzki's ring of distal esophagus 08/11/2023   Travel advice encounter 05/12/2023   Lumbar radiculopathy 07/29/2022   CKD stage 3 secondary to diabetes (HCC) 05/17/2022   Zenkers diverticulum 05/05/2022   Presbyesophagus 01/30/2022   Prurigo nodularis 07/30/2021   Abnormal breast finding 07/30/2021   MGUS (monoclonal gammopathy of unknown significance) 01/22/2021   Anemia 01/09/2021   S/P TKR (total knee replacement) using cement, right 10/25/2020   Preoperative clearance 09/18/2020   Aortic atherosclerosis 07/10/2020   Exertional dyspnea 05/20/2020    Balance problem 05/12/2020   History of recent fall 05/12/2020   Type 2 DM with diabetic neuropathy affecting both sides of body (HCC) 01/05/2020   Bilateral chronic knee pain 04/11/2018   S/P breast implant, silicone 03/11/2018   Osteoporosis of femur without pathological fracture 03/11/2018   Nutcracker esophagus 01/01/2018   Tubular adenoma of colon    Squamous cell skin cancer 07/21/2016   Dysphagia 04/08/2016   RBBB    Chest pain    OSA (obstructive sleep apnea) 11/22/2013   Hypovitaminosis D 01/11/2013   History of pernicious anemia 01/11/2013   Hyperlipidemia associated with type 2 diabetes mellitus (HCC) 09/04/2011   Multiple thyroid  nodules 08/27/2011   Cervical dysplasia 08/27/2011   Screening for breast cancer 03/13/2011   Iatrogenic hypothyroidism    GERD (gastroesophageal reflux disease)    Anxiety with obsessional features    Basal cell carcinoma of face 10/03/2010    ONSET DATE: 02/09/24  REFERRING DIAG: R26.89 (ICD-10-CM) - Balance problem   THERAPY DIAG:  Abnormality of gait and mobility  Difficulty in walking, not elsewhere classified  Muscle weakness (generalized)  Other abnormalities of gait and mobility  Unsteadiness on feet  Rationale for Evaluation and Treatment: Rehabilitation  SUBJECTIVE:                                                                                                                                                                                             SUBJECTIVE STATEMENT:  Patient complains of back pain at start of session; is getting injection later this week.   Eval: Pt reports she has had PT previously. Pt has pinched nerve in her spine that she [facility] shots [facility]. Pt balance is very challenging in dark environments. Pt has had multiple falls where she has no recall of why she fell. Pt has difficulty walking straight most of the time and frequently feels like she is going to fall.  Pt also has difficulty  turning. Pt reports no dizziness just general imbalance.  Pt accompanied by: self  PERTINENT HISTORY: Knee replacement, low back pain, DM, HLD,   PAIN:  Are you having pain? Has pain in back occasionally, none in seated position at present   PRECAUTIONS: None  RED FLAGS: None   WEIGHT BEARING RESTRICTIONS: No  FALLS: Has patient fallen in last 6 months? Yes. Number of falls 2-3  LIVING ENVIRONMENT: Lives with: lives with their spouse Lives in: House/apartment Stairs: yes but level entry and does not need to go upstairs in her home  Has following equipment at home: Walker - 2 wheeled and does not regularly use AD  PLOF: Independent and Independent with basic ADLs  PATIENT GOALS: Improve balance and reduce fall risk   OBJECTIVE:  Note: Objective measures were completed at Evaluation unless otherwise noted.  COGNITION: Overall cognitive status: Within functional limits for tasks assessed   SENSATION: Not tested  LOWER EXTREMITY ROM:   WNL for tasks assessed  LOWER EXTREMITY MMT:    MMT Right Eval Left Eval  Hip flexion 4 4  Hip extension    Hip abduction 4 4  Hip adduction 4+ 4+  Hip internal rotation    Hip external rotation    Knee flexion 4+ 4+  Knee extension 4+ 4+  Ankle dorsiflexion    Ankle plantarflexion    Ankle inversion    Ankle eversion    (Blank rows = not tested)   TRANSFERS: Sit to stand: Complete Independence  Assistive device utilized: knee valgus bilateral and None     Stand to sit: Complete Independence and knee valgus noted  Assistive device utilized: None     Chair to chair: Complete Independence  Assistive device utilized: None       RAMP:  Not tested  CURB:  Not tested  STAIRS: Not tested GAIT: Findings: Gait Characteristics: decreased arm swing- Right, decreased arm swing- Left, decreased step length- Right, decreased step length- Left, decreased stride length, decreased hip/knee flexion- Right, and decreased hip/knee  flexion- Left, Distance walked: 30 ft, and Comments: 1 LOB with step recovery   FUNCTIONAL TESTS:  MCTSIB: Condition 1: Avg of 3 trials: 30 sec, Condition 2: Avg of 3 trials: 30 sec, Condition 3: Avg of 3 trials: 30 sec, Condition 4: Avg of 3 trials: 15 sec, and Total Score: 105/120 5XSTS: 21.12 sec no UE assist, B knee valgus throughout  10 Meter Walk Test: Patient instructed to walk 10 meters (32.8 ft) as quickly and as safely as possible at their normal speed Results: .58 m/s (17.06 seconds )  Cut off scores:   Household Ambulator  < 0.4 m/s  Limited Community Ambulator  0.4 - 0.8 m/s  [employer]  > 0.8 m/s  Increased fall risk  < 1.60m/s  Crossing a Street  >1.82m/s  MCID 0.05 m/s (small), 0.13 m/s (moderate), 0.06 m/s (significant)  (ANPTA Core Set of Outcome Measures for Adults with Neurologic Conditions, 2018)       PATIENT SURVEYS:  ABC scale: The Activities-Specific Balance Confidence (ABC) Scale 0% 10 20 30  40 50 60 70 80 90 100% No confidence<->completely confident  "How confident are you that you will not lose your balance or become unsteady when you . . .   Date tested   Walk around the house 50%  2. Walk up or down stairs 70%  3. Bend over and pick up a slipper from in front of a closet  floor 60%  4. Reach for a small can off a shelf at eye level 90%  5. Stand on tip toes and reach for something above your head 70%  6. Stand on a chair and reach for something 90%  7. Sweep the floor 90%  8. Walk outside the house to a car parked in the driveway 50%  9. Get into or out of a car 60%  10. Walk across a parking lot to the mall 50%  11. Walk up or down a ramp 90%  12. Walk in a crowded mall where people rapidly walk past you 60%  13. Are bumped into by people as you walk through the mall 50%  14. Step onto or off of an escalator while you are holding onto the railing 50%  15. Step onto or off an escalator while holding onto parcels such that you cannot  hold onto the railing 0%  16. Walk outside on icy sidewalks 0%  Total: #/16 58.125%                                                                                                                                 TREATMENT DATE: 03/28/24   TA- To improve functional movements patterns for everyday tasks   Nustep rolling hills setting level 3-8 x 8 min with moist heat pack applied ot lumbar region throughout for pain modulation and preparation for other activities and for B UE and lE reciprocal movement training.   Side step with band around kneed GTB 2 x 5 laps in // bars   Gait with band around knees 2 x 320 ft   NMR: To facilitate reeducation of movement, balance, posture, coordination, and/or proprioception/kinesthetic sense.  NBOS on airex EC 3 x 30 sec   Airex side step on and off x 10 ea LE    PATIENT EDUCATION: Education details: POC Person educated: Patient Education method: Explanation Education comprehension: verbalized understanding   HOME EXERCISE PROGRAM: Access Code: [email or web address] URL: [email or web address] Date: 03/16/2024 Prepared by: Lonni Gainer  Exercises - Side Stepping with Resistance at Ankles and Counter Support  - 1 x daily - 7 x weekly - 2 sets - 15 reps - Wide tandem with counter support as needed   - 1 x daily - 7 x weekly - 3 sets - 30 sec hold - Standing Balance with Eyes Closed  - 1 x daily - 7 x weekly - 3 sets - 30 sec hold - Sit to Stand  - 1 x daily - 7 x weekly - 2 sets - 8 reps   GOALS: Goals reviewed with patient? Yes  SHORT TERM GOALS: Target date: 04/11/2024     Patient will be independent in home exercise program to improve strength/mobility for better functional independence with ADLs. Baseline: No HEP currently  Goal status: INITIAL   LONG TERM GOALS: Target date: 06/06/2024   1.  Patient will complete five times sit  to stand test in < 15 seconds indicating an increased LE strength and improved  balance. Baseline: 21.12 sec no UE Goal status: INITIAL  2.  Patient will improve ABC score to 70%   to demonstrate statistically significant improvement in mobility and quality of life as it relates to their balance confidence.  Baseline: 58.125% Goal status: INITIAL   3.  Patient will increase Berg Balance score by > 6 points to demonstrate decreased fall risk during functional activities. Baseline: TBD Goal status: INITIAL   4.   Patient will increase 10 meter walk test to >1.64m/s as to improve gait speed for better community ambulation and to reduce fall risk. Baseline: .58 m/s Goal status: INITIAL  5.   Patient will maintain stance in condition 4 of MCTSIB test for 30 sec on average of 3 trials in order to indicate improved balance in conditions of low of minimal light and compliant surface.  Baseline: 15 sec average of 3 trials  Goal status: INITIAL    ASSESSMENT:  CLINICAL IMPRESSION:  Patient presents with good motivation for completion of physical therapy activities.  Pt responds well to MHP on back on nustep providing relief for low back for better tolerance of later activities. Pt shows improvement with EC on airex activities.  Patient demonstrates good efficacy with form and was instructed to practice exercises as able between now and her next session. Pt will continue to benefit from skilled physical therapy intervention to address impairments, improve QOL, and attain therapy goals.   OBJECTIVE IMPAIRMENTS: Abnormal gait, decreased activity tolerance, decreased balance, decreased mobility, difficulty walking, and decreased strength.   ACTIVITY LIMITATIONS: standing, squatting, stairs, transfers, and locomotion level  PARTICIPATION LIMITATIONS: community activity, occupation, and yard work  PERSONAL FACTORS: Age, Time since onset of injury/illness/exacerbation, and 3+ comorbidities: DM, Anxiety, DOE, GERD with Jackhammer esophagus are also affecting patient's  functional outcome.   REHAB POTENTIAL: Good  CLINICAL DECISION MAKING: Evolving/moderate complexity  EVALUATION COMPLEXITY: Moderate  PLAN:  PT FREQUENCY: 2x/week  PT DURATION: 12 weeks  PLANNED INTERVENTIONS: [id number]- Physical Performance Testing, 97110-Therapeutic exercises, 97530- Therapeutic activity, 97112- Neuromuscular re-education, 97535- Self Care, [id number]- Manual therapy, [phone]- Gait training, [phone]- Canalith repositioning, Patient/Family education, Balance training, Stair training, and Vestibular training  PLAN FOR NEXT SESSION: hip strength and balance   Lonni KATHEE Gainer, PT 03/28/2024, 5:07 PM

## 2024-03-30 ENCOUNTER — Ambulatory Visit: Admitting: Physical Therapy

## 2024-03-30 DIAGNOSIS — R269 Unspecified abnormalities of gait and mobility: Secondary | ICD-10-CM

## 2024-03-30 DIAGNOSIS — M6281 Muscle weakness (generalized): Secondary | ICD-10-CM

## 2024-03-30 DIAGNOSIS — R2681 Unsteadiness on feet: Secondary | ICD-10-CM

## 2024-03-30 DIAGNOSIS — R262 Difficulty in walking, not elsewhere classified: Secondary | ICD-10-CM

## 2024-03-30 DIAGNOSIS — M5416 Radiculopathy, lumbar region: Secondary | ICD-10-CM | POA: Diagnosis not present

## 2024-03-30 DIAGNOSIS — R2689 Other abnormalities of gait and mobility: Secondary | ICD-10-CM

## 2024-03-30 NOTE — Therapy (Unsigned)
 OUTPATIENT PHYSICAL THERAPY NEURO TREATMENT   Patient Name: Megan Bradley MRN: [id number] DOB:11-10-1940, 83 y.o., female Today's Date: 03/30/2024   PCP: Marylynn Verneita CROME, MD  REFERRING PROVIDER: Marylynn Verneita CROME, MD  END OF SESSION:  PT End of Session - 03/30/24 1500     Visit Number 6    Number of Visits 24    Date for Recertification  06/06/24    Progress Note Due on Visit 10    PT Start Time 1454    PT Stop Time 1528    PT Time Calculation (min) 34 min    Equipment Utilized During Treatment Gait belt    Activity Tolerance Patient tolerated treatment well;No increased pain;Treatment limited secondary to medical complications (Comment)   Upper respiratory congestion   Behavior During Therapy [facility] for tasks assessed/performed             Past Medical History:  Diagnosis Date   Abnormal cervical Papanicolaou smear 01/05/2020   Overview:   a.  Pap smears q 4 to 6 months.       b.  LEEP procedure 04-2009.      Allergy COEDINE; OXY DRUGS   Anemia RECENTLY   Anxiety YES, QUITE DIFFICULT AT TIMES   Anxiety disorder    Aortic atherosclerosis    Arthritis SOME; JUST HAD KNEE REPLACEMENT   both knees   Breast disorder in female 10/14/2019   Cervical dysplasia    a. 2010 - high grade squamous intraepithelial lesion s/p excision.   Chest pain    a. 2006 or 2007 Cath [facility]): reportedly nl cors;  b. 09/2011 ETT: nl.   Depression SOME PROBABLY   Diabetes mellitus without complication (HCC) CONTROLLED WITHOUT RX   DOE (dyspnea on exertion)    Family history of adverse reaction to anesthesia    grand daughter was hard to wake up after back surgery   GERD (gastroesophageal reflux disease) YES AND A JACKHAMMER ESOPHEGUS   Grade II diastolic dysfunction    Herpes zoster    Hyperlipidemia    Hypothyroidism    Mild aortic regurgitation    Nutcracker esophagus    Orthostatic hypotension    Pneumonia    Pre-diabetes    Ruptured left breast implant, initial encounter  07/05/2018   Skin cancer AT LEAST 15 YEARS AGO, AND CONTINUING,   Sleep apnea    does not use cpap, did not tolerate   Vertigo    Wears dentures    partial lower   Past Surgical History:  Procedure Laterality Date   AUGMENTATION MAMMAPLASTY Bilateral    BREAST EXCISIONAL BIOPSY Left    CARDIAC CATHETERIZATION  2003   Dr.Callwood, R/L heart cath,   COLONOSCOPY WITH PROPOFOL  N/A 06/12/2017   Procedure: COLONOSCOPY WITH PROPOFOL ;  Surgeon: Jinny Carmine, MD;  Location: [facility];  Service: Endoscopy;  Laterality: N/A;   dental work     ESOPHAGEAL MANOMETRY N/A 06/19/2016   Procedure: ESOPHAGEAL MANOMETRY (EM);  Surgeon: Carmine Jinny, MD;  Location: [facility];  Service: Endoscopy;  Laterality: N/A;   ESOPHAGOGASTRODUODENOSCOPY (EGD) WITH PROPOFOL  N/A 04/03/2017   Procedure: ESOPHAGOGASTRODUODENOSCOPY (EGD) WITH PROPOFOL ;  Surgeon: Jinny Carmine, MD;  Location: [facility];  Service: Endoscopy;  Laterality: N/A;  diabetic - diet controlled   EYE SURGERY  CATARACTS REMOVED - 2   THYROIDECTOMY  05/15/2015   [facility]   TONSILLECTOMY     TOTAL KNEE ARTHROPLASTY Right 10/25/2020   Procedure: RIGHT TOTAL KNEE ARTHROPLASTY;  Surgeon: Marchia Drivers, MD;  Location: [facility];  Service: Orthopedics;  Laterality: Right;   Patient Active Problem List   Diagnosis Date Noted   Insomnia due to mental condition 02/12/2024   Schatzki's ring of distal esophagus 08/11/2023   Travel advice encounter 05/12/2023   Lumbar radiculopathy 07/29/2022   CKD stage 3 secondary to diabetes (HCC) 05/17/2022   Zenkers diverticulum 05/05/2022   Presbyesophagus 01/30/2022   Prurigo nodularis 07/30/2021   Abnormal breast finding 07/30/2021   MGUS (monoclonal gammopathy of unknown significance) 01/22/2021   Anemia 01/09/2021   S/P TKR (total knee replacement) using cement, right 10/25/2020   Preoperative clearance 09/18/2020   Aortic atherosclerosis 07/10/2020   Exertional dyspnea 05/20/2020    Balance problem 05/12/2020   History of recent fall 05/12/2020   Type 2 DM with diabetic neuropathy affecting both sides of body (HCC) 01/05/2020   Bilateral chronic knee pain 04/11/2018   S/P breast implant, silicone 03/11/2018   Osteoporosis of femur without pathological fracture 03/11/2018   Nutcracker esophagus 01/01/2018   Tubular adenoma of colon    Squamous cell skin cancer 07/21/2016   Dysphagia 04/08/2016   RBBB    Chest pain    OSA (obstructive sleep apnea) 11/22/2013   Hypovitaminosis D 01/11/2013   History of pernicious anemia 01/11/2013   Hyperlipidemia associated with type 2 diabetes mellitus (HCC) 09/04/2011   Multiple thyroid  nodules 08/27/2011   Cervical dysplasia 08/27/2011   Screening for breast cancer 03/13/2011   Iatrogenic hypothyroidism    GERD (gastroesophageal reflux disease)    Anxiety with obsessional features    Basal cell carcinoma of face 10/03/2010    ONSET DATE: 02/09/24  REFERRING DIAG: R26.89 (ICD-10-CM) - Balance problem   THERAPY DIAG:  Abnormality of gait and mobility  Difficulty in walking, not elsewhere classified  Muscle weakness (generalized)  Other abnormalities of gait and mobility  Unsteadiness on feet  Rationale for Evaluation and Treatment: Rehabilitation  SUBJECTIVE:                                                                                                                                                                                             SUBJECTIVE STATEMENT:  Patient complains of back pain at start of session; is getting injection later today. Some pain referring into her R hip anteriorly.   Eval: Pt reports she has had PT previously. Pt has pinched nerve in her spine that she [facility] shots [facility] at [facility]. Pt balance is very challenging in dark environments. Pt has had multiple falls where she has no recall of why she fell. Pt has difficulty walking straight most of the time and frequently  feels like she is  going to fall. Pt also has difficulty turning. Pt reports no dizziness just general imbalance.  Pt accompanied by: self  PERTINENT HISTORY: Knee replacement, low back pain, DM, HLD,   PAIN:  Are you having pain? Has pain in back occasionally, none in seated position at present   PRECAUTIONS: None  RED FLAGS: None   WEIGHT BEARING RESTRICTIONS: No  FALLS: Has patient fallen in last 6 months? Yes. Number of falls 2-3  LIVING ENVIRONMENT: Lives with: lives with their spouse Lives in: House/apartment Stairs: yes but level entry and does not need to go upstairs in her home  Has following equipment at home: Walker - 2 wheeled and does not regularly use AD  PLOF: Independent and Independent with basic ADLs  PATIENT GOALS: Improve balance and reduce fall risk   OBJECTIVE:  Note: Objective measures were completed at Evaluation unless otherwise noted.  COGNITION: Overall cognitive status: Within functional limits for tasks assessed   SENSATION: Not tested  LOWER EXTREMITY ROM:   WNL for tasks assessed  LOWER EXTREMITY MMT:    MMT Right Eval Left Eval  Hip flexion 4 4  Hip extension    Hip abduction 4 4  Hip adduction 4+ 4+  Hip internal rotation    Hip external rotation    Knee flexion 4+ 4+  Knee extension 4+ 4+  Ankle dorsiflexion    Ankle plantarflexion    Ankle inversion    Ankle eversion    (Blank rows = not tested)   TRANSFERS: Sit to stand: Complete Independence  Assistive device utilized: knee valgus bilateral and None     Stand to sit: Complete Independence and knee valgus noted  Assistive device utilized: None     Chair to chair: Complete Independence  Assistive device utilized: None       RAMP:  Not tested  CURB:  Not tested  STAIRS: Not tested GAIT: Findings: Gait Characteristics: decreased arm swing- Right, decreased arm swing- Left, decreased step length- Right, decreased step length- Left, decreased stride length, decreased hip/knee  flexion- Right, and decreased hip/knee flexion- Left, Distance walked: 30 ft, and Comments: 1 LOB with step recovery   FUNCTIONAL TESTS:  MCTSIB: Condition 1: Avg of 3 trials: 30 sec, Condition 2: Avg of 3 trials: 30 sec, Condition 3: Avg of 3 trials: 30 sec, Condition 4: Avg of 3 trials: 15 sec, and Total Score: 105/120 5XSTS: 21.12 sec no UE assist, B knee valgus throughout  10 Meter Walk Test: Patient instructed to walk 10 meters (32.8 ft) as quickly and as safely as possible at their normal speed Results: .58 m/s (17.06 seconds )  Cut off scores:   Household Ambulator  < 0.4 m/s  Limited Community Ambulator  0.4 - 0.8 m/s  [employer]  > 0.8 m/s  Increased fall risk  < 1.92m/s  Crossing a Street  >1.35m/s  MCID 0.05 m/s (small), 0.13 m/s (moderate), 0.06 m/s (significant)  (ANPTA Core Set of Outcome Measures for Adults with Neurologic Conditions, 2018)       PATIENT SURVEYS:  ABC scale: The Activities-Specific Balance Confidence (ABC) Scale 0% 10 20 30  40 50 60 70 80 90 100% No confidence<->completely confident  "How confident are you that you will not lose your balance or become unsteady when you . . .   Date tested   Walk around the house 50%  2. Walk up or down stairs 70%  3. Bend over and pick up a  slipper from in front of a closet floor 60%  4. Reach for a small can off a shelf at eye level 90%  5. Stand on tip toes and reach for something above your head 70%  6. Stand on a chair and reach for something 90%  7. Sweep the floor 90%  8. Walk outside the house to a car parked in the driveway 50%  9. Get into or out of a car 60%  10. Walk across a parking lot to the mall 50%  11. Walk up or down a ramp 90%  12. Walk in a crowded mall where people rapidly walk past you 60%  13. Are bumped into by people as you walk through the mall 50%  14. Step onto or off of an escalator while you are holding onto the railing 50%  15. Step onto or off an escalator while  holding onto parcels such that you cannot hold onto the railing 0%  16. Walk outside on icy sidewalks 0%  Total: #/16 58.125%                                                                                                                                 TREATMENT DATE: 03/30/24   TA- To improve functional movements patterns for everyday tasks   Nustep  level 3-5 x 8 min with moist heat pack applied ot lumbar region throughout for pain modulation and preparation for other activities and for B UE and lE reciprocal movement training.   Side step with band around kneed GTB 2 x 6 laps along raised treatment table for optional UE support  bars   Seated hip abduction 2 x 20 reps with GTB   Sit to stand 2 x 10 with GTB around knees- difficulty maintaining appropriate separation between knees.   Gait with band around knees 2 x 320 ft   NMR: To facilitate reeducation of movement, balance, posture, coordination, and/or proprioception/kinesthetic sense.  Airex side step on and off x 10 ea LE   Pt session was cut a little short today due to pt arriving late to scheduled appointment time   PATIENT EDUCATION: Education details: POC Person educated: Patient Education method: Explanation Education comprehension: verbalized understanding   HOME EXERCISE PROGRAM: Access Code: [email or web address] URL: [email or web address] Date: 03/16/2024 Prepared by: Lonni Gainer  Exercises - Side Stepping with Resistance at Ankles and Counter Support  - 1 x daily - 7 x weekly - 2 sets - 15 reps - Wide tandem with counter support as needed   - 1 x daily - 7 x weekly - 3 sets - 30 sec hold - Standing Balance with Eyes Closed  - 1 x daily - 7 x weekly - 3 sets - 30 sec hold - Sit to Stand  - 1 x daily - 7 x weekly - 2 sets - 8 reps   GOALS: Goals reviewed with patient? Yes  SHORT TERM GOALS: Target  date: 04/11/2024     Patient will be independent in home exercise program to improve  strength/mobility for better functional independence with ADLs. Baseline: No HEP currently  Goal status: INITIAL   LONG TERM GOALS: Target date: 06/06/2024   1.  Patient will complete five times sit to stand test in < 15 seconds indicating an increased LE strength and improved balance. Baseline: 21.12 sec no UE Goal status: INITIAL  2.  Patient will improve ABC score to 70%   to demonstrate statistically significant improvement in mobility and quality of life as it relates to their balance confidence.  Baseline: 58.125% Goal status: INITIAL   3.  Patient will increase Berg Balance score by > 6 points to demonstrate decreased fall risk during functional activities. Baseline: TBD Goal status: INITIAL   4.   Patient will increase 10 meter walk test to >1.1m/s as to improve gait speed for better community ambulation and to reduce fall risk. Baseline: .58 m/s Goal status: INITIAL  5.   Patient will maintain stance in condition 4 of MCTSIB test for 30 sec on average of 3 trials in order to indicate improved balance in conditions of low of minimal light and compliant surface.  Baseline: 15 sec average of 3 trials  Goal status: INITIAL    ASSESSMENT:  CLINICAL IMPRESSION:  Patient presents with good motivation for completion of physical therapy activities.  Pt responds well to MHP on back on nustep providing relief for low back for better tolerance of later activities. Pt challenged with hip abductor and ER strength with various activities in today session.  Patient demonstrates good efficacy with form and was instructed to practice exercises as able between now and her next session. Pt will continue to benefit from skilled physical therapy intervention to address impairments, improve QOL, and attain therapy goals.   OBJECTIVE IMPAIRMENTS: Abnormal gait, decreased activity tolerance, decreased balance, decreased mobility, difficulty walking, and decreased strength.   ACTIVITY  LIMITATIONS: standing, squatting, stairs, transfers, and locomotion level  PARTICIPATION LIMITATIONS: community activity, occupation, and yard work  PERSONAL FACTORS: Age, Time since onset of injury/illness/exacerbation, and 3+ comorbidities: DM, Anxiety, DOE, GERD with Jackhammer esophagus are also affecting patient's functional outcome.   REHAB POTENTIAL: Good  CLINICAL DECISION MAKING: Evolving/moderate complexity  EVALUATION COMPLEXITY: Moderate  PLAN:  PT FREQUENCY: 2x/week  PT DURATION: 12 weeks  PLANNED INTERVENTIONS: 97750- Physical Performance Testing, 97110-Therapeutic exercises, 97530- Therapeutic activity, 97112- Neuromuscular re-education, 97535- Self Care, [id number]- Manual therapy, [phone]- Gait training, [phone]- Canalith repositioning, Patient/Family education, Balance training, Stair training, and Vestibular training  PLAN FOR NEXT SESSION: hip strength and balance   Lonni KATHEE Gainer, PT 03/30/2024, 3:02 PM

## 2024-04-04 ENCOUNTER — Encounter: Payer: Self-pay | Admitting: Internal Medicine

## 2024-04-04 ENCOUNTER — Other Ambulatory Visit

## 2024-04-04 ENCOUNTER — Ambulatory Visit: Admitting: Physical Therapy

## 2024-04-04 DIAGNOSIS — R2689 Other abnormalities of gait and mobility: Secondary | ICD-10-CM

## 2024-04-04 DIAGNOSIS — R269 Unspecified abnormalities of gait and mobility: Secondary | ICD-10-CM

## 2024-04-04 DIAGNOSIS — R2681 Unsteadiness on feet: Secondary | ICD-10-CM

## 2024-04-04 DIAGNOSIS — M6281 Muscle weakness (generalized): Secondary | ICD-10-CM

## 2024-04-04 DIAGNOSIS — R262 Difficulty in walking, not elsewhere classified: Secondary | ICD-10-CM

## 2024-04-04 NOTE — Therapy (Signed)
 OUTPATIENT PHYSICAL THERAPY NEURO TREATMENT   Patient Name: Megan Bradley MRN: [id number] DOB:02/07/1941, 83 y.o., female Today's Date: 04/04/2024   PCP: Marylynn Verneita CROME, MD  REFERRING PROVIDER: Marylynn Verneita CROME, MD  END OF SESSION:  PT End of Session - 04/04/24 1453     Visit Number 7    Number of Visits 24    Date for Recertification  06/06/24    Progress Note Due on Visit 10    PT Start Time 1449    PT Stop Time 1528    PT Time Calculation (min) 39 min    Equipment Utilized During Treatment Gait belt    Activity Tolerance Patient tolerated treatment well;No increased pain;Treatment limited secondary to medical complications (Comment)   Upper respiratory congestion   Behavior During Therapy [facility] for tasks assessed/performed             Past Medical History:  Diagnosis Date   Abnormal cervical Papanicolaou smear 01/05/2020   Overview:   a.  Pap smears q 4 to 6 months.       b.  LEEP procedure 04-2009.      Allergy COEDINE; OXY DRUGS   Anemia RECENTLY   Anxiety YES, QUITE DIFFICULT AT TIMES   Anxiety disorder    Aortic atherosclerosis    Arthritis SOME; JUST HAD KNEE REPLACEMENT   both knees   Breast disorder in female 10/14/2019   Cervical dysplasia    a. 2010 - high grade squamous intraepithelial lesion s/p excision.   Chest pain    a. 2006 or 2007 [facility]): reportedly nl cors;  b. 09/2011 ETT: nl.   Depression SOME PROBABLY   Diabetes mellitus without complication (HCC) CONTROLLED WITHOUT RX   DOE (dyspnea on exertion)    Family history of adverse reaction to anesthesia    grand daughter was hard to wake up after back surgery   GERD (gastroesophageal reflux disease) YES AND A JACKHAMMER ESOPHEGUS   Grade II diastolic dysfunction    Herpes zoster    Hyperlipidemia    Hypothyroidism    Mild aortic regurgitation    Nutcracker esophagus    Orthostatic hypotension    Pneumonia    Pre-diabetes    Ruptured left breast implant, initial encounter  07/05/2018   Skin cancer AT LEAST 15 YEARS AGO, AND CONTINUING,   Sleep apnea    does not use cpap, did not tolerate   Vertigo    Wears dentures    partial lower   Past Surgical History:  Procedure Laterality Date   AUGMENTATION MAMMAPLASTY Bilateral    BREAST EXCISIONAL BIOPSY Left    CARDIAC CATHETERIZATION  2003   Dr.Callwood, R/L heart cath,   COLONOSCOPY WITH PROPOFOL  N/A 06/12/2017   Procedure: COLONOSCOPY WITH PROPOFOL ;  Surgeon: Jinny Carmine, MD;  Location: [facility];  Service: Endoscopy;  Laterality: N/A;   dental work     ESOPHAGEAL MANOMETRY N/A 06/19/2016   Procedure: ESOPHAGEAL MANOMETRY (EM);  Surgeon: Carmine Jinny, MD;  Location: [facility];  Service: Endoscopy;  Laterality: N/A;   ESOPHAGOGASTRODUODENOSCOPY (EGD) WITH PROPOFOL  N/A 04/03/2017   Procedure: ESOPHAGOGASTRODUODENOSCOPY (EGD) WITH PROPOFOL ;  Surgeon: Jinny Carmine, MD;  Location: [facility];  Service: Endoscopy;  Laterality: N/A;  diabetic - diet controlled   EYE SURGERY  CATARACTS REMOVED - 2   THYROIDECTOMY  05/15/2015   [facility]   TONSILLECTOMY     TOTAL KNEE ARTHROPLASTY Right 10/25/2020   Procedure: RIGHT TOTAL KNEE ARTHROPLASTY;  Surgeon: Marchia Drivers, MD;  Location: [facility];  Service: Orthopedics;  Laterality: Right;   Patient Active Problem List   Diagnosis Date Noted   Insomnia due to mental condition 02/12/2024   Schatzki's ring of distal esophagus 08/11/2023   Travel advice encounter 05/12/2023   Lumbar radiculopathy 07/29/2022   CKD stage 3 secondary to diabetes (HCC) 05/17/2022   Zenkers diverticulum 05/05/2022   Presbyesophagus 01/30/2022   Prurigo nodularis 07/30/2021   Abnormal breast finding 07/30/2021   MGUS (monoclonal gammopathy of unknown significance) 01/22/2021   Anemia 01/09/2021   S/P TKR (total knee replacement) using cement, right 10/25/2020   Preoperative clearance 09/18/2020   Aortic atherosclerosis 07/10/2020   Exertional dyspnea 05/20/2020    Balance problem 05/12/2020   History of recent fall 05/12/2020   Type 2 DM with diabetic neuropathy affecting both sides of body (HCC) 01/05/2020   Bilateral chronic knee pain 04/11/2018   S/P breast implant, silicone 03/11/2018   Osteoporosis of femur without pathological fracture 03/11/2018   Nutcracker esophagus 01/01/2018   Tubular adenoma of colon    Squamous cell skin cancer 07/21/2016   Dysphagia 04/08/2016   RBBB    Chest pain    OSA (obstructive sleep apnea) 11/22/2013   Hypovitaminosis D 01/11/2013   History of pernicious anemia 01/11/2013   Hyperlipidemia associated with type 2 diabetes mellitus (HCC) 09/04/2011   Multiple thyroid  nodules 08/27/2011   Cervical dysplasia 08/27/2011   Screening for breast cancer 03/13/2011   Iatrogenic hypothyroidism    GERD (gastroesophageal reflux disease)    Anxiety with obsessional features    Basal cell carcinoma of face 10/03/2010    ONSET DATE: 02/09/24  REFERRING DIAG: R26.89 (ICD-10-CM) - Balance problem   THERAPY DIAG:  Abnormality of gait and mobility  Difficulty in walking, not elsewhere classified  Muscle weakness (generalized)  Other abnormalities of gait and mobility  Unsteadiness on feet  Rationale for Evaluation and Treatment: Rehabilitation  SUBJECTIVE:                                                                                                                                                                                             SUBJECTIVE STATEMENT:  Pt reports her back pain has been relieved by her injections. Had a busy weekend and could not work on her HEP.   Eval: Pt reports she has had PT previously. Pt has pinched nerve in her spine that she gets shots for at [facility]. Pt balance is very challenging in dark environments. Pt has had multiple falls where she has no recall of why she fell. Pt has difficulty walking straight most of the time and frequently  feels like she is going to fall. Pt  also has difficulty turning. Pt reports no dizziness just general imbalance.  Pt accompanied by: self  PERTINENT HISTORY: Knee replacement, low back pain, DM, HLD,   PAIN:  Are you having pain? Has pain in back occasionally, none in seated position at present   PRECAUTIONS: None  RED FLAGS: None   WEIGHT BEARING RESTRICTIONS: No  FALLS: Has patient fallen in last 6 months? Yes. Number of falls 2-3  LIVING ENVIRONMENT: Lives with: lives with their spouse Lives in: House/apartment Stairs: yes but level entry and does not need to go upstairs in her home  Has following equipment at home: Walker - 2 wheeled and does not regularly use AD  PLOF: Independent and Independent with basic ADLs  PATIENT GOALS: Improve balance and reduce fall risk   OBJECTIVE:  Note: Objective measures were completed at Evaluation unless otherwise noted.  COGNITION: Overall cognitive status: Within functional limits for tasks assessed   SENSATION: Not tested  LOWER EXTREMITY ROM:   WNL for tasks assessed  LOWER EXTREMITY MMT:    MMT Right Eval Left Eval  Hip flexion 4 4  Hip extension    Hip abduction 4 4  Hip adduction 4+ 4+  Hip internal rotation    Hip external rotation    Knee flexion 4+ 4+  Knee extension 4+ 4+  Ankle dorsiflexion    Ankle plantarflexion    Ankle inversion    Ankle eversion    (Blank rows = not tested)   TRANSFERS: Sit to stand: Complete Independence  Assistive device utilized: knee valgus bilateral and None     Stand to sit: Complete Independence and knee valgus noted  Assistive device utilized: None     Chair to chair: Complete Independence  Assistive device utilized: None       RAMP:  Not tested  CURB:  Not tested  STAIRS: Not tested GAIT: Findings: Gait Characteristics: decreased arm swing- Right, decreased arm swing- Left, decreased step length- Right, decreased step length- Left, decreased stride length, decreased hip/knee flexion- Right, and  decreased hip/knee flexion- Left, Distance walked: 30 ft, and Comments: 1 LOB with step recovery   FUNCTIONAL TESTS:  MCTSIB: Condition 1: Avg of 3 trials: 30 sec, Condition 2: Avg of 3 trials: 30 sec, Condition 3: Avg of 3 trials: 30 sec, Condition 4: Avg of 3 trials: 15 sec, and Total Score: 105/120 5XSTS: 21.12 sec no UE assist, B knee valgus throughout  10 Meter Walk Test: Patient instructed to walk 10 meters (32.8 ft) as quickly and as safely as possible at their normal speed Results: .58 m/s (17.06 seconds )  Cut off scores:   Household Ambulator  < 0.4 m/s  Limited Community Ambulator  0.4 - 0.8 m/s  [employer]  > 0.8 m/s  Increased fall risk  < 1.49m/s  Crossing a Street  >1.68m/s  MCID 0.05 m/s (small), 0.13 m/s (moderate), 0.06 m/s (significant)  (ANPTA Core Set of Outcome Measures for Adults with Neurologic Conditions, 2018)       PATIENT SURVEYS:   ABC scale: The Activities-Specific Balance Confidence (ABC) Scale 0% 10 20 30  40 50 60 70 80 90 100% No confidence<->completely confident  "How confident are you that you will not lose your balance or become unsteady when you . . .   Date tested   Walk around the house 50%  2. Walk up or down stairs 70%  3. Bend over and pick up  a slipper from in front of a closet floor 60%  4. Reach for a small can off a shelf at eye level 90%  5. Stand on tip toes and reach for something above your head 70%  6. Stand on a chair and reach for something 90%  7. Sweep the floor 90%  8. Walk outside the house to a car parked in the driveway 50%  9. Get into or out of a car 60%  10. Walk across a parking lot to the mall 50%  11. Walk up or down a ramp 90%  12. Walk in a crowded mall where people rapidly walk past you 60%  13. Are bumped into by people as you walk through the mall 50%  14. Step onto or off of an escalator while you are holding onto the railing 50%  15. Step onto or off an escalator while holding onto parcels  such that you cannot hold onto the railing 0%  16. Walk outside on icy sidewalks 0%  Total: #/16 58.125%                                                                                                                                 TREATMENT DATE: 04/04/24   TA- To improve functional movements patterns for everyday tasks   Seated hip abduction 2 x 20 reps with GTB   Sit to stand 2 x 10 with GTB around knees- difficulty maintaining appropriate separation between knees.   Side step with band around knees GTB  x 10 laps  - x10 laps band around ankles at support bar  Side step onto step trainer with 1 riser 2 x 10 on each LE without UE support   NMR: To facilitate reeducation of movement, balance, posture, coordination, and/or proprioception/kinesthetic sense.  Airex side step on airex beam x 10 laps   Walking on and over red mat with objects beneath x12 rounds - Added airex pad at each end and had pt. Perform 180 degree turn and repeat x4 laps    PATIENT EDUCATION: Education details: POC Person educated: Patient Education method: Explanation Education comprehension: verbalized understanding   HOME EXERCISE PROGRAM: Access Code: [email or web address] URL: [email or web address] Date: 03/16/2024 Prepared by: Lonni Gainer  Exercises - Side Stepping with Resistance at Ankles and Counter Support  - 1 x daily - 7 x weekly - 2 sets - 15 reps - Wide tandem with counter support as needed   - 1 x daily - 7 x weekly - 3 sets - 30 sec hold - Standing Balance with Eyes Closed  - 1 x daily - 7 x weekly - 3 sets - 30 sec hold - Sit to Stand  - 1 x daily - 7 x weekly - 2 sets - 8 reps   GOALS: Goals reviewed with patient? Yes  SHORT TERM GOALS: Target date: 04/11/2024     Patient will be independent in home exercise program to  improve strength/mobility for better functional independence with ADLs. Baseline: No HEP currently  Goal status: INITIAL   LONG TERM GOALS: Target  date: 06/06/2024   1.  Patient will complete five times sit to stand test in < 15 seconds indicating an increased LE strength and improved balance. Baseline: 21.12 sec no UE Goal status: INITIAL  2.  Patient will improve ABC score to 70%   to demonstrate statistically significant improvement in mobility and quality of life as it relates to their balance confidence.  Baseline: 58.125% Goal status: INITIAL   3.  Patient will increase Berg Balance score by > 6 points to demonstrate decreased fall risk during functional activities. Baseline: TBD Goal status: INITIAL   4.   Patient will increase 10 meter walk test to >1.93m/s as to improve gait speed for better community ambulation and to reduce fall risk. Baseline: .58 m/s Goal status: INITIAL  5.   Patient will maintain stance in condition 4 of MCTSIB test for 30 sec on average of 3 trials in order to indicate improved balance in conditions of low of minimal light and compliant surface.  Baseline: 15 sec average of 3 trials  Goal status: INITIAL    ASSESSMENT:  CLINICAL IMPRESSION:  Patient presents with good motivation for completion of physical therapy activities. Continued to focus on hip strengthening particularly in the frontal plane. Also worked on some scientific laboratory technician. Pt. Challenged with airex bean side stepping, and will continue to progress this and balance training in future sessions. Pt will continue to benefit from skilled physical therapy intervention to address impairments, improve QOL, and attain therapy goals.   OBJECTIVE IMPAIRMENTS: Abnormal gait, decreased activity tolerance, decreased balance, decreased mobility, difficulty walking, and decreased strength.   ACTIVITY LIMITATIONS: standing, squatting, stairs, transfers, and locomotion level  PARTICIPATION LIMITATIONS: community activity, occupation, and yard work  PERSONAL FACTORS: Age, Time since onset of injury/illness/exacerbation, and 3+  comorbidities: DM, Anxiety, DOE, GERD with Jackhammer esophagus are also affecting patient's functional outcome.   REHAB POTENTIAL: Good  CLINICAL DECISION MAKING: Evolving/moderate complexity  EVALUATION COMPLEXITY: Moderate  PLAN:  PT FREQUENCY: 2x/week  PT DURATION: 12 weeks  PLANNED INTERVENTIONS: 97750- Physical Performance Testing, 97110-Therapeutic exercises, 97530- Therapeutic activity, 97112- Neuromuscular re-education, 97535- Self Care, [id number]- Manual therapy, [phone]- Gait training, [phone]- Canalith repositioning, Patient/Family education, Balance training, Stair training, and Vestibular training  PLAN FOR NEXT SESSION: hip strength and balance   Lonni KATHEE Gainer, PT 04/04/2024, 3:19 PM

## 2024-04-05 ENCOUNTER — Ambulatory Visit

## 2024-04-05 ENCOUNTER — Other Ambulatory Visit (INDEPENDENT_AMBULATORY_CARE_PROVIDER_SITE_OTHER)

## 2024-04-05 DIAGNOSIS — D696 Thrombocytopenia, unspecified: Secondary | ICD-10-CM

## 2024-04-05 DIAGNOSIS — E559 Vitamin D deficiency, unspecified: Secondary | ICD-10-CM

## 2024-04-05 DIAGNOSIS — E032 Hypothyroidism due to medicaments and other exogenous substances: Secondary | ICD-10-CM | POA: Diagnosis not present

## 2024-04-05 DIAGNOSIS — E1142 Type 2 diabetes mellitus with diabetic polyneuropathy: Secondary | ICD-10-CM | POA: Diagnosis not present

## 2024-04-05 DIAGNOSIS — Z23 Encounter for immunization: Secondary | ICD-10-CM | POA: Diagnosis not present

## 2024-04-05 NOTE — Telephone Encounter (Signed)
 Attempted to call pt. No answer.

## 2024-04-05 NOTE — Progress Notes (Unsigned)
 Pt presented today for Prevnar 20 and flu vaccine. Pt tolerated both injections well.

## 2024-04-06 ENCOUNTER — Ambulatory Visit: Admitting: Physical Therapy

## 2024-04-06 LAB — CBC WITH DIFFERENTIAL/PLATELET
Basophils Absolute: 0 K/uL (ref 0.0–0.1)
Basophils Relative: 0.4 % (ref 0.0–3.0)
Eosinophils Absolute: 0.1 K/uL (ref 0.0–0.7)
Eosinophils Relative: 0.9 % (ref 0.0–5.0)
HCT: 32.1 % — ABNORMAL LOW (ref 36.0–46.0)
Hemoglobin: 10.7 g/dL — ABNORMAL LOW (ref 12.0–15.0)
Lymphocytes Relative: 21.1 % (ref 12.0–46.0)
Lymphs Abs: 1.7 K/uL (ref 0.7–4.0)
MCHC: 33.3 g/dL (ref 30.0–36.0)
MCV: 94.9 fl (ref 78.0–100.0)
Monocytes Absolute: 0.4 K/uL (ref 0.1–1.0)
Monocytes Relative: 5.1 % (ref 3.0–12.0)
Neutro Abs: 5.9 K/uL (ref 1.4–7.7)
Neutrophils Relative %: 72.5 % (ref 43.0–77.0)
Platelets: 143 K/uL — ABNORMAL LOW (ref 150.0–400.0)
RBC: 3.39 Mil/uL — ABNORMAL LOW (ref 3.87–5.11)
RDW: 22.2 % — ABNORMAL HIGH (ref 11.5–15.5)
WBC: 8.2 K/uL (ref 4.0–10.5)

## 2024-04-06 LAB — HEMOGLOBIN A1C: Hgb A1c MFr Bld: 7.1 % — ABNORMAL HIGH (ref 4.6–6.5)

## 2024-04-06 LAB — TSH: TSH: 21.34 u[IU]/mL — ABNORMAL HIGH (ref 0.35–5.50)

## 2024-04-06 LAB — LACTATE DEHYDROGENASE: LDH: 141 U/L (ref 120–250)

## 2024-04-06 LAB — VITAMIN D 25 HYDROXY (VIT D DEFICIENCY, FRACTURES): VITD: 28.57 ng/mL — ABNORMAL LOW (ref 30.00–100.00)

## 2024-04-07 ENCOUNTER — Ambulatory Visit: Admitting: Physical Therapy

## 2024-04-07 ENCOUNTER — Ambulatory Visit: Payer: Self-pay | Admitting: Internal Medicine

## 2024-04-07 DIAGNOSIS — R269 Unspecified abnormalities of gait and mobility: Secondary | ICD-10-CM | POA: Diagnosis not present

## 2024-04-07 DIAGNOSIS — M6281 Muscle weakness (generalized): Secondary | ICD-10-CM

## 2024-04-07 DIAGNOSIS — R2681 Unsteadiness on feet: Secondary | ICD-10-CM

## 2024-04-07 DIAGNOSIS — R262 Difficulty in walking, not elsewhere classified: Secondary | ICD-10-CM

## 2024-04-07 DIAGNOSIS — R2689 Other abnormalities of gait and mobility: Secondary | ICD-10-CM

## 2024-04-07 MED ORDER — ERGOCALCIFEROL 1.25 MG (50000 UT) PO CAPS: 50000.0000 [IU] | ORAL_CAPSULE | ORAL | 1 refills | Status: DC

## 2024-04-07 NOTE — Therapy (Signed)
 OUTPATIENT PHYSICAL THERAPY NEURO TREATMENT   Patient Name: Megan Bradley MRN: [id number] DOB:Aug 26, 1940, 83 y.o., female Today's Date: 04/07/2024   PCP: Marylynn Verneita CROME, MD  REFERRING PROVIDER: Marylynn Verneita CROME, MD  END OF SESSION:  PT End of Session - 04/07/24 0903     Visit Number 8    Number of Visits 24    Date for Recertification  06/06/24    Progress Note Due on Visit 10    PT Start Time 0856    PT Stop Time 0928    PT Time Calculation (min) 32 min    Equipment Utilized During Treatment Gait belt    Activity Tolerance Patient tolerated treatment well;No increased pain;Treatment limited secondary to medical complications (Comment)   Upper respiratory congestion   Behavior During Therapy [facility] for tasks assessed/performed             Past Medical History:  Diagnosis Date   Abnormal cervical Papanicolaou smear 01/05/2020   Overview:   a.  Pap smears q 4 to 6 months.       b.  LEEP procedure 04-2009.      Allergy COEDINE; OXY DRUGS   Anemia RECENTLY   Anxiety YES, QUITE DIFFICULT AT TIMES   Anxiety disorder    Aortic atherosclerosis    Arthritis SOME; JUST HAD KNEE REPLACEMENT   both knees   Breast disorder in female 10/14/2019   Cervical dysplasia    a. 2010 - high grade squamous intraepithelial lesion s/p excision.   Chest pain    a. 2006 or 2007 Cath [facility]): reportedly nl cors;  b. 09/2011 ETT: nl.   Depression SOME PROBABLY   Diabetes mellitus without complication (HCC) CONTROLLED WITHOUT RX   DOE (dyspnea on exertion)    Family history of adverse reaction to anesthesia    grand daughter was hard to wake up after back surgery   GERD (gastroesophageal reflux disease) YES AND A JACKHAMMER ESOPHEGUS   Grade II diastolic dysfunction    Herpes zoster    Hyperlipidemia    Hypothyroidism    Mild aortic regurgitation    Nutcracker esophagus    Orthostatic hypotension    Pneumonia    Pre-diabetes    Ruptured left breast implant, initial encounter  07/05/2018   Skin cancer AT LEAST 15 YEARS AGO, AND CONTINUING,   Sleep apnea    does not use cpap, did not tolerate   Vertigo    Wears dentures    partial lower   Past Surgical History:  Procedure Laterality Date   AUGMENTATION MAMMAPLASTY Bilateral    BREAST EXCISIONAL BIOPSY Left    CARDIAC CATHETERIZATION  2003   Dr.Callwood, R/L heart cath,   COLONOSCOPY WITH PROPOFOL  N/A 06/12/2017   Procedure: COLONOSCOPY WITH PROPOFOL ;  Surgeon: Jinny Carmine, MD;  Location: [facility];  Service: Endoscopy;  Laterality: N/A;   dental work     ESOPHAGEAL MANOMETRY N/A 06/19/2016   Procedure: ESOPHAGEAL MANOMETRY (EM);  Surgeon: Carmine Jinny, MD;  Location: [facility];  Service: Endoscopy;  Laterality: N/A;   ESOPHAGOGASTRODUODENOSCOPY (EGD) WITH PROPOFOL  N/A 04/03/2017   Procedure: ESOPHAGOGASTRODUODENOSCOPY (EGD) WITH PROPOFOL ;  Surgeon: Jinny Carmine, MD;  Location: [facility];  Service: Endoscopy;  Laterality: N/A;  diabetic - diet controlled   EYE SURGERY  CATARACTS REMOVED - 2   THYROIDECTOMY  05/15/2015   [facility]   TONSILLECTOMY     TOTAL KNEE ARTHROPLASTY Right 10/25/2020   Procedure: RIGHT TOTAL KNEE ARTHROPLASTY;  Surgeon: Marchia Drivers, MD;  Location: [facility];  Service: Orthopedics;  Laterality: Right;   Patient Active Problem List   Diagnosis Date Noted   Insomnia due to mental condition 02/12/2024   Schatzki's ring of distal esophagus 08/11/2023   Travel advice encounter 05/12/2023   Lumbar radiculopathy 07/29/2022   CKD stage 3 secondary to diabetes (HCC) 05/17/2022   Zenkers diverticulum 05/05/2022   Presbyesophagus 01/30/2022   Prurigo nodularis 07/30/2021   Abnormal breast finding 07/30/2021   MGUS (monoclonal gammopathy of unknown significance) 01/22/2021   Anemia 01/09/2021   S/P TKR (total knee replacement) using cement, right 10/25/2020   Preoperative clearance 09/18/2020   Aortic atherosclerosis 07/10/2020   Exertional dyspnea 05/20/2020    Balance problem 05/12/2020   History of recent fall 05/12/2020   Type 2 DM with diabetic neuropathy affecting both sides of body (HCC) 01/05/2020   Bilateral chronic knee pain 04/11/2018   S/P breast implant, silicone 03/11/2018   Osteoporosis of femur without pathological fracture 03/11/2018   Nutcracker esophagus 01/01/2018   Tubular adenoma of colon    Squamous cell skin cancer 07/21/2016   Dysphagia 04/08/2016   RBBB    Chest pain    OSA (obstructive sleep apnea) 11/22/2013   Hypovitaminosis D 01/11/2013   History of pernicious anemia 01/11/2013   Hyperlipidemia associated with type 2 diabetes mellitus (HCC) 09/04/2011   Multiple thyroid  nodules 08/27/2011   Cervical dysplasia 08/27/2011   Screening for breast cancer 03/13/2011   Iatrogenic hypothyroidism    GERD (gastroesophageal reflux disease)    Anxiety with obsessional features    Basal cell carcinoma of face 10/03/2010    ONSET DATE: 02/09/24  REFERRING DIAG: R26.89 (ICD-10-CM) - Balance problem   THERAPY DIAG:  Abnormality of gait and mobility  Difficulty in walking, not elsewhere classified  Muscle weakness (generalized)  Other abnormalities of gait and mobility  Unsteadiness on feet  Rationale for Evaluation and Treatment: Rehabilitation  SUBJECTIVE:                                                                                                                                                                                             SUBJECTIVE STATEMENT:  Pt reports her back pain has been relieved by her injections. Had a busy weekend and could not work on her HEP.   Eval: Pt reports she has had PT previously. Pt has pinched nerve in her spine that she gets shots [facility] at [facility]. Pt balance is very challenging in dark environments. Pt has had multiple falls where she has no recall of why she fell. Pt has difficulty walking straight most of the time and frequently  feels like she is going to fall. Pt  also has difficulty turning. Pt reports no dizziness just general imbalance.  Pt accompanied by: self  PERTINENT HISTORY: Knee replacement, low back pain, DM, HLD,   PAIN:  Are you having pain? Has pain in back occasionally, none in seated position at present   PRECAUTIONS: None  RED FLAGS: None   WEIGHT BEARING RESTRICTIONS: No  FALLS: Has patient fallen in last 6 months? Yes. Number of falls 2-3  LIVING ENVIRONMENT: Lives with: lives with their spouse Lives in: House/apartment Stairs: yes but level entry and does not need to go upstairs in her home  Has following equipment at home: Walker - 2 wheeled and does not regularly use AD  PLOF: Independent and Independent with basic ADLs  PATIENT GOALS: Improve balance and reduce fall risk   OBJECTIVE:  Note: Objective measures were completed at Evaluation unless otherwise noted.  COGNITION: Overall cognitive status: Within functional limits for tasks assessed   SENSATION: Not tested  LOWER EXTREMITY ROM:   WNL for tasks assessed  LOWER EXTREMITY MMT:    MMT Right Eval Left Eval  Hip flexion 4 4  Hip extension    Hip abduction 4 4  Hip adduction 4+ 4+  Hip internal rotation    Hip external rotation    Knee flexion 4+ 4+  Knee extension 4+ 4+  Ankle dorsiflexion    Ankle plantarflexion    Ankle inversion    Ankle eversion    (Blank rows = not tested)   TRANSFERS: Sit to stand: Complete Independence  Assistive device utilized: knee valgus bilateral and None     Stand to sit: Complete Independence and knee valgus noted  Assistive device utilized: None     Chair to chair: Complete Independence  Assistive device utilized: None       RAMP:  Not tested  CURB:  Not tested  STAIRS: Not tested GAIT: Findings: Gait Characteristics: decreased arm swing- Right, decreased arm swing- Left, decreased step length- Right, decreased step length- Left, decreased stride length, decreased hip/knee flexion- Right, and  decreased hip/knee flexion- Left, Distance walked: 30 ft, and Comments: 1 LOB with step recovery   FUNCTIONAL TESTS:  MCTSIB: Condition 1: Avg of 3 trials: 30 sec, Condition 2: Avg of 3 trials: 30 sec, Condition 3: Avg of 3 trials: 30 sec, Condition 4: Avg of 3 trials: 15 sec, and Total Score: 105/120 5XSTS: 21.12 sec no UE assist, B knee valgus throughout  10 Meter Walk Test: Patient instructed to walk 10 meters (32.8 ft) as quickly and as safely as possible at their normal speed Results: .58 m/s (17.06 seconds )  Cut off scores:   Household Ambulator  < 0.4 m/s  Limited Community Ambulator  0.4 - 0.8 m/s  [employer]  > 0.8 m/s  Increased fall risk  < 1.29m/s  Crossing a Street  >1.75m/s  MCID 0.05 m/s (small), 0.13 m/s (moderate), 0.06 m/s (significant)  (ANPTA Core Set of Outcome Measures for Adults with Neurologic Conditions, 2018)       PATIENT SURVEYS:   ABC scale: The Activities-Specific Balance Confidence (ABC) Scale 0% 10 20 30  40 50 60 70 80 90 100% No confidence<->completely confident  "How confident are you that you will not lose your balance or become unsteady when you . . .   Date tested   Walk around the house 50%  2. Walk up or down stairs 70%  3. Bend over and pick up  a slipper from in front of a closet floor 60%  4. Reach for a small can off a shelf at eye level 90%  5. Stand on tip toes and reach for something above your head 70%  6. Stand on a chair and reach for something 90%  7. Sweep the floor 90%  8. Walk outside the house to a car parked in the driveway 50%  9. Get into or out of a car 60%  10. Walk across a parking lot to the mall 50%  11. Walk up or down a ramp 90%  12. Walk in a crowded mall where people rapidly walk past you 60%  13. Are bumped into by people as you walk through the mall 50%  14. Step onto or off of an escalator while you are holding onto the railing 50%  15. Step onto or off an escalator while holding onto parcels  such that you cannot hold onto the railing 0%  16. Walk outside on icy sidewalks 0%  Total: #/16 58.125%                                                                                                                                 TREATMENT DATE: 04/07/24   TA- To improve functional movements patterns for everyday tasks   Pt session was cut a little short today due to pt arriving late to scheduled appointment time  Nustep HIIT L 5-9 x 8 min rolling hills setting, cues for SPM and pace, education on benefit of similar exercise.   Sidestep with band around knees and squat each round 3 x 10   Side step onto step trainer with 1 riser 2 x 10 on each LE without UE support   NMR: To facilitate reeducation of movement, balance, posture, coordination, and/or proprioception/kinesthetic sense.  Airex pad step up and down laterally x 10 ea and then forward and back x 10 ea LE    PATIENT EDUCATION: Education details: POC Person educated: Patient Education method: Explanation Education comprehension: verbalized understanding   HOME EXERCISE PROGRAM: Access Code: [email or web address] URL: [email or web address] Date: 03/16/2024 Prepared by: Lonni Gainer  Exercises - Side Stepping with Resistance at Ankles and Counter Support  - 1 x daily - 7 x weekly - 2 sets - 15 reps - Wide tandem with counter support as needed   - 1 x daily - 7 x weekly - 3 sets - 30 sec hold - Standing Balance with Eyes Closed  - 1 x daily - 7 x weekly - 3 sets - 30 sec hold - Sit to Stand  - 1 x daily - 7 x weekly - 2 sets - 8 reps   GOALS: Goals reviewed with patient? Yes  SHORT TERM GOALS: Target date: 04/11/2024     Patient will be independent in home exercise program to improve strength/mobility for better functional independence with ADLs. Baseline: No HEP currently  Goal status: INITIAL  LONG TERM GOALS: Target date: 06/06/2024   1.  Patient will complete five times sit to stand test in <  15 seconds indicating an increased LE strength and improved balance. Baseline: 21.12 sec no UE Goal status: INITIAL  2.  Patient will improve ABC score to 70%   to demonstrate statistically significant improvement in mobility and quality of life as it relates to their balance confidence.  Baseline: 58.125% Goal status: INITIAL   3.  Patient will increase Berg Balance score by > 6 points to demonstrate decreased fall risk during functional activities. Baseline: TBD Goal status: INITIAL   4.   Patient will increase 10 meter walk test to >1.75m/s as to improve gait speed for better community ambulation and to reduce fall risk. Baseline: .58 m/s Goal status: INITIAL  5.   Patient will maintain stance in condition 4 of MCTSIB test for 30 sec on average of 3 trials in order to indicate improved balance in conditions of low of minimal light and compliant surface.  Baseline: 15 sec average of 3 trials  Goal status: INITIAL    ASSESSMENT:  CLINICAL IMPRESSION:  Patient presents with good motivation for completion of physical therapy activities. Continued to focus on hip strengthening particularly in the frontal plane. Also worked on some scientific laboratory technician. Pt will continue to benefit from skilled physical therapy intervention to address impairments, improve QOL, and attain therapy goals.   OBJECTIVE IMPAIRMENTS: Abnormal gait, decreased activity tolerance, decreased balance, decreased mobility, difficulty walking, and decreased strength.   ACTIVITY LIMITATIONS: standing, squatting, stairs, transfers, and locomotion level  PARTICIPATION LIMITATIONS: community activity, occupation, and yard work  PERSONAL FACTORS: Age, Time since onset of injury/illness/exacerbation, and 3+ comorbidities: DM, Anxiety, DOE, GERD with Jackhammer esophagus are also affecting patient's functional outcome.   REHAB POTENTIAL: Good  CLINICAL DECISION MAKING: Evolving/moderate complexity  EVALUATION  COMPLEXITY: Moderate  PLAN:  PT FREQUENCY: 2x/week  PT DURATION: 12 weeks  PLANNED INTERVENTIONS: 97750- Physical Performance Testing, 97110-Therapeutic exercises, 97530- Therapeutic activity, 97112- Neuromuscular re-education, 97535- Self Care, [id number]- Manual therapy, [phone]- Gait training, [phone]- Canalith repositioning, Patient/Family education, Balance training, Stair training, and Vestibular training  PLAN FOR NEXT SESSION: hip strength and balance- sidestepping on airex beam    Lonni KATHEE Gainer, PT 04/07/2024, 9:04 AM

## 2024-04-07 NOTE — Telephone Encounter (Signed)
 completed

## 2024-04-07 NOTE — Addendum Note (Signed)
 Addended by: MARYLYNN VERNEITA CROME on: 04/07/2024 09:25 PM   Modules accepted: Orders

## 2024-04-10 ENCOUNTER — Other Ambulatory Visit: Payer: Self-pay | Admitting: Internal Medicine

## 2024-04-10 DIAGNOSIS — D631 Anemia in chronic kidney disease: Secondary | ICD-10-CM

## 2024-04-10 DIAGNOSIS — E042 Nontoxic multinodular goiter: Secondary | ICD-10-CM

## 2024-04-10 DIAGNOSIS — D696 Thrombocytopenia, unspecified: Secondary | ICD-10-CM

## 2024-04-10 MED ORDER — LEVOTHYROXINE SODIUM 125 MCG PO TABS: 125.0000 ug | ORAL_TABLET | Freq: Every day | ORAL | 0 refills | Status: DC

## 2024-04-10 MED ORDER — ERGOCALCIFEROL 1.25 MG (50000 UT) PO CAPS: 50000.0000 [IU] | ORAL_CAPSULE | ORAL | 1 refills | Status: DC

## 2024-04-11 ENCOUNTER — Ambulatory Visit: Attending: Internal Medicine | Admitting: Physical Therapy

## 2024-04-11 ENCOUNTER — Other Ambulatory Visit: Payer: Self-pay | Admitting: Internal Medicine

## 2024-04-11 DIAGNOSIS — M6281 Muscle weakness (generalized): Secondary | ICD-10-CM | POA: Insufficient documentation

## 2024-04-11 DIAGNOSIS — R262 Difficulty in walking, not elsewhere classified: Secondary | ICD-10-CM | POA: Diagnosis present

## 2024-04-11 DIAGNOSIS — R2689 Other abnormalities of gait and mobility: Secondary | ICD-10-CM | POA: Insufficient documentation

## 2024-04-11 DIAGNOSIS — R2681 Unsteadiness on feet: Secondary | ICD-10-CM | POA: Diagnosis present

## 2024-04-11 DIAGNOSIS — R269 Unspecified abnormalities of gait and mobility: Secondary | ICD-10-CM | POA: Diagnosis present

## 2024-04-11 NOTE — Therapy (Signed)
 OUTPATIENT PHYSICAL THERAPY NEURO TREATMENT   Patient Name: Megan Bradley MRN: [id number] DOB:12-Mar-1941, 83 y.o., female Today's Date: 04/11/2024   PCP: Marylynn Verneita CROME, MD  REFERRING PROVIDER: Marylynn Verneita CROME, MD  END OF SESSION:  PT End of Session - 04/11/24 1458     Visit Number 9    Number of Visits 24    Date for Recertification  06/06/24    Progress Note Due on Visit 10    PT Start Time 1450    PT Stop Time 1528    PT Time Calculation (min) 38 min    Equipment Utilized During Treatment Gait belt    Activity Tolerance Patient tolerated treatment well;No increased pain;Treatment limited secondary to medical complications (Comment)   Upper respiratory congestion   Behavior During Therapy [facility] for tasks assessed/performed             Past Medical History:  Diagnosis Date   Abnormal cervical Papanicolaou smear 01/05/2020   Overview:   a.  Pap smears q 4 to 6 months.       b.  LEEP procedure 04-2009.      Allergy COEDINE; OXY DRUGS   Anemia RECENTLY   Anxiety YES, QUITE DIFFICULT AT TIMES   Anxiety disorder    Aortic atherosclerosis    Arthritis SOME; JUST HAD KNEE REPLACEMENT   both knees   Breast disorder in female 10/14/2019   Cervical dysplasia    a. 2010 - high grade squamous intraepithelial lesion s/p excision.   Chest pain    a. 2006 or 2007 [facility]): reportedly nl cors;  b. 09/2011 ETT: nl.   Depression SOME PROBABLY   Diabetes mellitus without complication (HCC) CONTROLLED WITHOUT RX   DOE (dyspnea on exertion)    Family history of adverse reaction to anesthesia    grand daughter was hard to wake up after back surgery   GERD (gastroesophageal reflux disease) YES AND A JACKHAMMER ESOPHEGUS   Grade II diastolic dysfunction    Herpes zoster    Hyperlipidemia    Hypothyroidism    Mild aortic regurgitation    Nutcracker esophagus    Orthostatic hypotension    Pneumonia    Pre-diabetes    Ruptured left breast implant, initial encounter 07/05/2018    Skin cancer AT LEAST 15 YEARS AGO, AND CONTINUING,   Sleep apnea    does not use cpap, did not tolerate   Vertigo    Wears dentures    partial lower   Past Surgical History:  Procedure Laterality Date   AUGMENTATION MAMMAPLASTY Bilateral    BREAST EXCISIONAL BIOPSY Left    CARDIAC CATHETERIZATION  2003   Dr.Callwood, R/L heart cath,   COLONOSCOPY WITH PROPOFOL  N/A 06/12/2017   Procedure: COLONOSCOPY WITH PROPOFOL ;  Surgeon: Jinny Carmine, MD;  Location: [facility];  Service: Endoscopy;  Laterality: N/A;   dental work     ESOPHAGEAL MANOMETRY N/A 06/19/2016   Procedure: ESOPHAGEAL MANOMETRY (EM);  Surgeon: Carmine Jinny, MD;  Location: [facility];  Service: Endoscopy;  Laterality: N/A;   ESOPHAGOGASTRODUODENOSCOPY (EGD) WITH PROPOFOL  N/A 04/03/2017   Procedure: ESOPHAGOGASTRODUODENOSCOPY (EGD) WITH PROPOFOL ;  Surgeon: Jinny Carmine, MD;  Location: [facility];  Service: Endoscopy;  Laterality: N/A;  diabetic - diet controlled   EYE SURGERY  CATARACTS REMOVED - 2   THYROIDECTOMY  05/15/2015   [facility]   TONSILLECTOMY     TOTAL KNEE ARTHROPLASTY Right 10/25/2020   Procedure: RIGHT TOTAL KNEE ARTHROPLASTY;  Surgeon: Marchia Drivers, MD;  Location: [facility];  Service: Orthopedics;  Laterality: Right;   Patient Active Problem List   Diagnosis Date Noted   Insomnia due to mental condition 02/12/2024   Schatzki's ring of distal esophagus 08/11/2023   Travel advice encounter 05/12/2023   Lumbar radiculopathy 07/29/2022   CKD stage 3 secondary to diabetes (HCC) 05/17/2022   Zenkers diverticulum 05/05/2022   Presbyesophagus 01/30/2022   Prurigo nodularis 07/30/2021   Abnormal breast finding 07/30/2021   MGUS (monoclonal gammopathy of unknown significance) 01/22/2021   Anemia 01/09/2021   S/P TKR (total knee replacement) using cement, right 10/25/2020   Preoperative clearance 09/18/2020   Aortic atherosclerosis 07/10/2020   Exertional dyspnea 05/20/2020   Balance  problem 05/12/2020   History of recent fall 05/12/2020   Type 2 DM with diabetic neuropathy affecting both sides of body (HCC) 01/05/2020   Bilateral chronic knee pain 04/11/2018   S/P breast implant, silicone 03/11/2018   Osteoporosis of femur without pathological fracture 03/11/2018   Nutcracker esophagus 01/01/2018   Tubular adenoma of colon    Squamous cell skin cancer 07/21/2016   Dysphagia 04/08/2016   RBBB    Chest pain    OSA (obstructive sleep apnea) 11/22/2013   Hypovitaminosis D 01/11/2013   History of pernicious anemia 01/11/2013   Hyperlipidemia associated with type 2 diabetes mellitus (HCC) 09/04/2011   Multiple thyroid  nodules 08/27/2011   Cervical dysplasia 08/27/2011   Screening for breast cancer 03/13/2011   Iatrogenic hypothyroidism    GERD (gastroesophageal reflux disease)    Anxiety with obsessional features    Basal cell carcinoma of face 10/03/2010    ONSET DATE: 02/09/24  REFERRING DIAG: R26.89 (ICD-10-CM) - Balance problem   THERAPY DIAG:  Abnormality of gait and mobility  Difficulty in walking, not elsewhere classified  Muscle weakness (generalized)  Other abnormalities of gait and mobility  Unsteadiness on feet  Rationale for Evaluation and Treatment: Rehabilitation  SUBJECTIVE:                                                                                                                                                                                             SUBJECTIVE STATEMENT:  Pt reports her back pain has been relieved by her injections. Had a busy weekend and could not work on her HEP.   Eval: Pt reports she has had PT previously. Pt has pinched nerve in her spine that she gets shots for at [facility]. Pt balance is very challenging in dark environments. Pt has had multiple falls where she has no recall of why she fell. Pt has difficulty walking straight most of the time and frequently  feels like she is going to fall. Pt also has  difficulty turning. Pt reports no dizziness just general imbalance.  Pt accompanied by: self  PERTINENT HISTORY: Knee replacement, low back pain, DM, HLD,   PAIN:  Are you having pain? Has pain in back occasionally, none in seated position at present   PRECAUTIONS: None  RED FLAGS: None   WEIGHT BEARING RESTRICTIONS: No  FALLS: Has patient fallen in last 6 months? Yes. Number of falls 2-3  LIVING ENVIRONMENT: Lives with: lives with their spouse Lives in: House/apartment Stairs: yes but level entry and does not need to go upstairs in her home  Has following equipment at home: Walker - 2 wheeled and does not regularly use AD  PLOF: Independent and Independent with basic ADLs  PATIENT GOALS: Improve balance and reduce fall risk   OBJECTIVE:  Note: Objective measures were completed at Evaluation unless otherwise noted.  COGNITION: Overall cognitive status: Within functional limits for tasks assessed   SENSATION: Not tested  LOWER EXTREMITY ROM:   WNL for tasks assessed  LOWER EXTREMITY MMT:    MMT Right Eval Left Eval  Hip flexion 4 4  Hip extension    Hip abduction 4 4  Hip adduction 4+ 4+  Hip internal rotation    Hip external rotation    Knee flexion 4+ 4+  Knee extension 4+ 4+  Ankle dorsiflexion    Ankle plantarflexion    Ankle inversion    Ankle eversion    (Blank rows = not tested)   TRANSFERS: Sit to stand: Complete Independence  Assistive device utilized: knee valgus bilateral and None     Stand to sit: Complete Independence and knee valgus noted  Assistive device utilized: None     Chair to chair: Complete Independence  Assistive device utilized: None       RAMP:  Not tested  CURB:  Not tested  STAIRS: Not tested GAIT: Findings: Gait Characteristics: decreased arm swing- Right, decreased arm swing- Left, decreased step length- Right, decreased step length- Left, decreased stride length, decreased hip/knee flexion- Right, and decreased  hip/knee flexion- Left, Distance walked: 30 ft, and Comments: 1 LOB with step recovery   FUNCTIONAL TESTS:  MCTSIB: Condition 1: Avg of 3 trials: 30 sec, Condition 2: Avg of 3 trials: 30 sec, Condition 3: Avg of 3 trials: 30 sec, Condition 4: Avg of 3 trials: 15 sec, and Total Score: 105/120 5XSTS: 21.12 sec no UE assist, B knee valgus throughout  10 Meter Walk Test: Patient instructed to walk 10 meters (32.8 ft) as quickly and as safely as possible at their normal speed Results: .58 m/s (17.06 seconds )  Cut off scores:   Household Ambulator  < 0.4 m/s  Limited Community Ambulator  0.4 - 0.8 m/s  [employer]  > 0.8 m/s  Increased fall risk  < 1.25m/s  Crossing a Street  >1.35m/s  MCID 0.05 m/s (small), 0.13 m/s (moderate), 0.06 m/s (significant)  (ANPTA Core Set of Outcome Measures for Adults with Neurologic Conditions, 2018)       PATIENT SURVEYS:   ABC scale: The Activities-Specific Balance Confidence (ABC) Scale 0% 10 20 30  40 50 60 70 80 90 100% No confidence<->completely confident  "How confident are you that you will not lose your balance or become unsteady when you . . .   Date tested   Walk around the house 50%  2. Walk up or down stairs 70%  3. Bend over and pick up  a slipper from in front of a closet floor 60%  4. Reach for a small can off a shelf at eye level 90%  5. Stand on tip toes and reach for something above your head 70%  6. Stand on a chair and reach for something 90%  7. Sweep the floor 90%  8. Walk outside the house to a car parked in the driveway 50%  9. Get into or out of a car 60%  10. Walk across a parking lot to the mall 50%  11. Walk up or down a ramp 90%  12. Walk in a crowded mall where people rapidly walk past you 60%  13. Are bumped into by people as you walk through the mall 50%  14. Step onto or off of an escalator while you are holding onto the railing 50%  15. Step onto or off an escalator while holding onto parcels such that  you cannot hold onto the railing 0%  16. Walk outside on icy sidewalks 0%  Total: #/16 58.125%                                                                                                                                 TREATMENT DATE: 04/11/24   TA- To improve functional movements patterns for everyday tasks   Began with HIIT alternating between gait with band around kneed and 2# AW donned x 320 ft and 8 x STS with band around knees for improved gluteal activation and improved alignment.   NMR: To facilitate reeducation of movement, balance, posture, coordination, and/or proprioception/kinesthetic sense.  Airex pad sidestepping 2 x 8 laps with intermittent UE support but goal of no support   1 foot airex other on step 3 x 30 sec ea LE   -same set up with ball handoff laterally  x 10 ea side ball handoff and ea LE   PATIENT EDUCATION: Education details: Pt educated throughout session about proper posture and technique with exercises. Improved exercise technique, movement at target joints, use of target muscles after min to mod verbal, visual, tactile cues.  Person educated: Patient Education method: Explanation Education comprehension: verbalized understanding   HOME EXERCISE PROGRAM: Access Code: [email or web address] URL: [email or web address] Date: 03/16/2024 Prepared by: Lonni Gainer  Exercises - Side Stepping with Resistance at Ankles and Counter Support  - 1 x daily - 7 x weekly - 2 sets - 15 reps - Wide tandem with counter support as needed   - 1 x daily - 7 x weekly - 3 sets - 30 sec hold - Standing Balance with Eyes Closed  - 1 x daily - 7 x weekly - 3 sets - 30 sec hold - Sit to Stand  - 1 x daily - 7 x weekly - 2 sets - 8 reps   GOALS: Goals reviewed with patient? Yes  SHORT TERM GOALS: Target date: 04/11/2024     Patient will be independent in home exercise  program to improve strength/mobility for better functional independence with ADLs. Baseline:  No HEP currently  Goal status: INITIAL   LONG TERM GOALS: Target date: 06/06/2024   1.  Patient will complete five times sit to stand test in < 15 seconds indicating an increased LE strength and improved balance. Baseline: 21.12 sec no UE Goal status: INITIAL  2.  Patient will improve ABC score to 70%   to demonstrate statistically significant improvement in mobility and quality of life as it relates to their balance confidence.  Baseline: 58.125% Goal status: INITIAL   3.  Patient will increase Berg Balance score by > 6 points to demonstrate decreased fall risk during functional activities. Baseline: TBD Goal status: INITIAL   4.   Patient will increase 10 meter walk test to >1.73m/s as to improve gait speed for better community ambulation and to reduce fall risk. Baseline: .58 m/s Goal status: INITIAL  5.   Patient will maintain stance in condition 4 of MCTSIB test for 30 sec on average of 3 trials in order to indicate improved balance in conditions of low of minimal light and compliant surface.  Baseline: 15 sec average of 3 trials  Goal status: INITIAL    ASSESSMENT:  CLINICAL IMPRESSION:  Patient presents with good motivation for completion of physical therapy activities. Continued to focus on hip strengthening particularly in the frontal plane. Pt highly challenged with ball hand off activity requiring assistance at times to prevent significant LOB. Pt will continue to benefit from skilled physical therapy intervention to address impairments, improve QOL, and attain therapy goals.   OBJECTIVE IMPAIRMENTS: Abnormal gait, decreased activity tolerance, decreased balance, decreased mobility, difficulty walking, and decreased strength.   ACTIVITY LIMITATIONS: standing, squatting, stairs, transfers, and locomotion level  PARTICIPATION LIMITATIONS: community activity, occupation, and yard work  PERSONAL FACTORS: Age, Time since onset of injury/illness/exacerbation, and 3+  comorbidities: DM, Anxiety, DOE, GERD with Jackhammer esophagus are also affecting patient's functional outcome.   REHAB POTENTIAL: Good  CLINICAL DECISION MAKING: Evolving/moderate complexity  EVALUATION COMPLEXITY: Moderate  PLAN:  PT FREQUENCY: 2x/week  PT DURATION: 12 weeks  PLANNED INTERVENTIONS: 97750- Physical Performance Testing, 97110-Therapeutic exercises, 97530- Therapeutic activity, 97112- Neuromuscular re-education, 97535- Self Care, [id number]- Manual therapy, [phone]- Gait training, [phone]- Canalith repositioning, Patient/Family education, Balance training, Stair training, and Vestibular training  PLAN FOR NEXT SESSION: hip strength and balance- sidestepping on airex beam    Lonni KATHEE Gainer, PT 04/11/2024, 2:58 PM

## 2024-04-12 ENCOUNTER — Other Ambulatory Visit: Payer: Self-pay | Admitting: Internal Medicine

## 2024-04-12 DIAGNOSIS — Z1231 Encounter for screening mammogram for malignant neoplasm of breast: Secondary | ICD-10-CM

## 2024-04-13 ENCOUNTER — Ambulatory Visit: Admitting: Physical Therapy

## 2024-04-14 LAB — OPHTHALMOLOGY REPORT-SCANNED

## 2024-04-15 ENCOUNTER — Ambulatory Visit: Admitting: Physical Therapy

## 2024-04-19 ENCOUNTER — Ambulatory Visit: Admitting: Physical Therapy

## 2024-04-19 DIAGNOSIS — R269 Unspecified abnormalities of gait and mobility: Secondary | ICD-10-CM

## 2024-04-19 DIAGNOSIS — R2689 Other abnormalities of gait and mobility: Secondary | ICD-10-CM

## 2024-04-19 DIAGNOSIS — M6281 Muscle weakness (generalized): Secondary | ICD-10-CM

## 2024-04-19 DIAGNOSIS — R2681 Unsteadiness on feet: Secondary | ICD-10-CM

## 2024-04-19 DIAGNOSIS — R262 Difficulty in walking, not elsewhere classified: Secondary | ICD-10-CM

## 2024-04-19 NOTE — Therapy (Signed)
 OUTPATIENT PHYSICAL THERAPY NEURO PROGRESS NOTE/ RECERT/ TREATMENT   Patient Name: Megan Bradley MRN: [id number] DOB:1940-09-16, 83 y.o., female Today's Date: 04/19/2024   Dates of reporting period  03/14/2024   to   04/19/2024   PCP: Marylynn Verneita CROME, MD  REFERRING PROVIDER: Marylynn Verneita CROME, MD  END OF SESSION:  PT End of Session - 04/19/24 1102     Visit Number 10    Number of Visits 24    Date for Recertification  06/06/24    Progress Note Due on Visit 10    PT Start Time 1102    Equipment Utilized During Treatment Gait belt    Activity Tolerance Patient tolerated treatment well;No increased pain;Treatment limited secondary to medical complications (Comment)   Upper respiratory congestion   Behavior During Therapy [facility] for tasks assessed/performed           Past Medical History:  Diagnosis Date   Abnormal cervical Papanicolaou smear 01/05/2020   Overview:   a.  Pap smears q 4 to 6 months.       b.  LEEP procedure 04-2009.      Allergy COEDINE; OXY DRUGS   Anemia RECENTLY   Anxiety YES, QUITE DIFFICULT AT TIMES   Anxiety disorder    Aortic atherosclerosis    Arthritis SOME; JUST HAD KNEE REPLACEMENT   both knees   Breast disorder in female 10/14/2019   Cervical dysplasia    a. 2010 - high grade squamous intraepithelial lesion s/p excision.   Chest pain    a. 2006 or 2007 Cath [facility]): reportedly nl cors;  b. 09/2011 ETT: nl.   Depression SOME PROBABLY   Diabetes mellitus without complication (HCC) CONTROLLED WITHOUT RX   DOE (dyspnea on exertion)    Family history of adverse reaction to anesthesia    grand daughter was hard to wake up after back surgery   GERD (gastroesophageal reflux disease) YES AND A JACKHAMMER ESOPHEGUS   Grade II diastolic dysfunction    Herpes zoster    Hyperlipidemia    Hypothyroidism    Mild aortic regurgitation    Nutcracker esophagus    Orthostatic hypotension    Pneumonia    Pre-diabetes    Ruptured left breast implant,  initial encounter 07/05/2018   Skin cancer AT LEAST 15 YEARS AGO, AND CONTINUING,   Sleep apnea    does not use cpap, did not tolerate   Vertigo    Wears dentures    partial lower   Past Surgical History:  Procedure Laterality Date   AUGMENTATION MAMMAPLASTY Bilateral    BREAST EXCISIONAL BIOPSY Left    CARDIAC CATHETERIZATION  2003   Dr.Callwood, R/L heart cath,   COLONOSCOPY WITH PROPOFOL  N/A 06/12/2017   Procedure: COLONOSCOPY WITH PROPOFOL ;  Surgeon: Jinny Carmine, MD;  Location: [facility];  Service: Endoscopy;  Laterality: N/A;   dental work     ESOPHAGEAL MANOMETRY N/A 06/19/2016   Procedure: ESOPHAGEAL MANOMETRY (EM);  Surgeon: Carmine Jinny, MD;  Location: [facility];  Service: Endoscopy;  Laterality: N/A;   ESOPHAGOGASTRODUODENOSCOPY (EGD) WITH PROPOFOL  N/A 04/03/2017   Procedure: ESOPHAGOGASTRODUODENOSCOPY (EGD) WITH PROPOFOL ;  Surgeon: Jinny Carmine, MD;  Location: [facility];  Service: Endoscopy;  Laterality: N/A;  diabetic - diet controlled   EYE SURGERY  CATARACTS REMOVED - 2   THYROIDECTOMY  05/15/2015   [facility]   TONSILLECTOMY     TOTAL KNEE ARTHROPLASTY Right 10/25/2020   Procedure: RIGHT TOTAL KNEE ARTHROPLASTY;  Surgeon: Marchia Drivers, MD;  Location:  [facility];  Service: Orthopedics;  Laterality: Right;   Patient Active Problem List   Diagnosis Date Noted   Insomnia due to mental condition 02/12/2024   Schatzki's ring of distal esophagus 08/11/2023   Travel advice encounter 05/12/2023   Lumbar radiculopathy 07/29/2022   CKD stage 3 secondary to diabetes (HCC) 05/17/2022   Zenkers diverticulum 05/05/2022   Presbyesophagus 01/30/2022   Prurigo nodularis 07/30/2021   Abnormal breast finding 07/30/2021   MGUS (monoclonal gammopathy of unknown significance) 01/22/2021   Anemia 01/09/2021   S/P TKR (total knee replacement) using cement, right 10/25/2020   Preoperative clearance 09/18/2020   Aortic atherosclerosis 07/10/2020   Exertional  dyspnea 05/20/2020   Balance problem 05/12/2020   History of recent fall 05/12/2020   Type 2 DM with diabetic neuropathy affecting both sides of body (HCC) 01/05/2020   Bilateral chronic knee pain 04/11/2018   S/P breast implant, silicone 03/11/2018   Osteoporosis of femur without pathological fracture 03/11/2018   Nutcracker esophagus 01/01/2018   Tubular adenoma of colon    Squamous cell skin cancer 07/21/2016   Dysphagia 04/08/2016   RBBB    Chest pain    OSA (obstructive sleep apnea) 11/22/2013   Hypovitaminosis D 01/11/2013   History of pernicious anemia 01/11/2013   Hyperlipidemia associated with type 2 diabetes mellitus (HCC) 09/04/2011   Multiple thyroid  nodules 08/27/2011   Cervical dysplasia 08/27/2011   Screening for breast cancer 03/13/2011   Iatrogenic hypothyroidism    GERD (gastroesophageal reflux disease)    Anxiety with obsessional features    Basal cell carcinoma of face 10/03/2010    ONSET DATE: 02/09/24  REFERRING DIAG: R26.89 (ICD-10-CM) - Balance problem   THERAPY DIAG:  Abnormality of gait and mobility  Difficulty in walking, not elsewhere classified  Muscle weakness (generalized)  Other abnormalities of gait and mobility  Unsteadiness on feet  Rationale for Evaluation and Treatment: Rehabilitation  SUBJECTIVE:                                                                                                                                                                                             SUBJECTIVE STATEMENT:  Pt was working on an article prior to appt and arrived 5 min late. Pt states she experienced a fall this past Tuesday when trying to get her dog back into the house. The fall occurred while reaching for her dog. She landed in the grass and reports no injuries. Two employees with [employer] were there to assist her up and home safely.   Eval: Pt reports she has had PT previously. Pt has pinched nerve in her spine that  she gets shots for  at [facility]. Pt balance is very challenging in dark environments. Pt has had multiple falls where she has no recall of why she fell. Pt has difficulty walking straight most of the time and frequently feels like she is going to fall. Pt also has difficulty turning. Pt reports no dizziness just general imbalance.  Pt accompanied by: self  PERTINENT HISTORY: Knee replacement, low back pain, DM, HLD,   PAIN:  Are you having pain? Has pain in back occasionally, none in seated position at present   PRECAUTIONS: None  RED FLAGS: None   WEIGHT BEARING RESTRICTIONS: No  FALLS: Has patient fallen in last 6 months? Yes. Number of falls 2-3  LIVING ENVIRONMENT: Lives with: lives with their spouse Lives in: House/apartment Stairs: yes but level entry and does not need to go upstairs in her home  Has following equipment at home: Walker - 2 wheeled and does not regularly use AD  PLOF: Independent and Independent with basic ADLs  PATIENT GOALS: Improve balance and reduce fall risk   OBJECTIVE:  Note: Objective measures were completed at Evaluation unless otherwise noted.  COGNITION: Overall cognitive status: Within functional limits for tasks assessed   SENSATION: Not tested  LOWER EXTREMITY ROM:   WNL for tasks assessed  LOWER EXTREMITY MMT:    MMT Right Eval Left Eval  Hip flexion 4 4  Hip extension    Hip abduction 4 4  Hip adduction 4+ 4+  Hip internal rotation    Hip external rotation    Knee flexion 4+ 4+  Knee extension 4+ 4+  Ankle dorsiflexion    Ankle plantarflexion    Ankle inversion    Ankle eversion    (Blank rows = not tested)   TRANSFERS: Sit to stand: Complete Independence  Assistive device utilized: knee valgus bilateral and None     Stand to sit: Complete Independence and knee valgus noted  Assistive device utilized: None     Chair to chair: Complete Independence  Assistive device utilized: None       RAMP:  Not tested  CURB:  Not  tested  STAIRS: Not tested GAIT: Findings: Gait Characteristics: decreased arm swing- Right, decreased arm swing- Left, decreased step length- Right, decreased step length- Left, decreased stride length, decreased hip/knee flexion- Right, and decreased hip/knee flexion- Left, Distance walked: 30 ft, and Comments: 1 LOB with step recovery   FUNCTIONAL TESTS:  MCTSIB: Condition 1: Avg of 3 trials: 30 sec, Condition 2: Avg of 3 trials: 30 sec, Condition 3: Avg of 3 trials: 30 sec, Condition 4: Avg of 3 trials: 15 sec, and Total Score: 105/120 5XSTS: 21.12 sec no UE assist, B knee valgus throughout  10 Meter Walk Test: Patient instructed to walk 10 meters (32.8 ft) as quickly and as safely as possible at their normal speed Results: .58 m/s (17.06 seconds )  Cut off scores:   Household Ambulator  < 0.4 m/s  Limited Community Ambulator  0.4 - 0.8 m/s  [employer]  > 0.8 m/s  Increased fall risk  < 1.70m/s  Crossing a Street  >1.68m/s  MCID 0.05 m/s (small), 0.13 m/s (moderate), 0.06 m/s (significant)  (ANPTA Core Set of Outcome Measures for Adults with Neurologic Conditions, 2018)       PATIENT SURVEYS:   ABC scale: The Activities-Specific Balance Confidence (ABC) Scale 0% 10 20 30  40 50 60 70 80 90 100% No confidence<->completely confident  "How confident are you that  you will not lose your balance or become unsteady when you . . .   Date tested   Walk around the house 50%  2. Walk up or down stairs 70%  3. Bend over and pick up a slipper from in front of a closet floor 60%  4. Reach for a small can off a shelf at eye level 90%  5. Stand on tip toes and reach for something above your head 70%  6. Stand on a chair and reach for something 90%  7. Sweep the floor 90%  8. Walk outside the house to a car parked in the driveway 50%  9. Get into or out of a car 60%  10. Walk across a parking lot to the mall 50%  11. Walk up or down a ramp 90%  12. Walk in a crowded mall  where people rapidly walk past you 60%  13. Are bumped into by people as you walk through the mall 50%  14. Step onto or off of an escalator while you are holding onto the railing 50%  15. Step onto or off an escalator while holding onto parcels such that you cannot hold onto the railing 0%  16. Walk outside on icy sidewalks 0%  Total: #/16 58.125%                                                                                                                                 TREATMENT DATE: 04/19/24   Physical Performance Test or Measurement: a  physical performance test(s) or measurement (eg,  musculoskeletal, functional capacity), with written report,  each 15 mins    Five times Sit to Stand Test (FTSS)  TIME: 25.76 sec  Cut off scores indicative of increased fall risk: >12 sec CVA, >16 sec PD, >13 sec vestibular (ANPTA Core Set of Outcome Measures for Adults with Neurologic Conditions, 2018)   10 Meter Walk Test: Patient instructed to walk 10 meters (32.8 ft) as quickly and as safely as possible at their normal speed Results: 1.1 m/s (9.00 seconds)  Cut off scores:   Household Ambulator  < 0.4 m/s  Limited Community Ambulator  0.4 - 0.8 m/s  [employer]  > 0.8 m/s  Increased fall risk  < 1.74m/s  Crossing a Street  >1.38m/s  MCID 0.05 m/s (small), 0.13 m/s (moderate), 0.06 m/s (significant)  (ANPTA Core Set of Outcome Measures for Adults with Neurologic Conditions, 2018)     Patient demonstrates increased fall risk as noted by score of  51 /56 on Berg Balance Scale.  (<36= high risk for falls, close to 100%; 37-45 significant >80%; 46-51 moderate >50%; 52-55 lower >25%)   MCTSIB Condition 4: 3x 30 sec   DGI: PT instructed pt in DGI. See below for results. Demonstrates increased fall risk with score of 15/24. (<19 indicates increased fall risk)     [facility] PT Assessment - 04/19/24 0001  Standardized Balance Assessment   Standardized Balance Assessment  Dynamic Gait Index      Berg Balance Test   Sit to Stand Able to stand without using hands and stabilize independently    Standing Unsupported Able to stand safely 2 minutes    Sitting with Back Unsupported but Feet Supported on Floor or Stool Able to sit safely and securely 2 minutes    Stand to Sit Sits safely with minimal use of hands    Transfers Able to transfer safely, minor use of hands    Standing Unsupported with Eyes Closed Able to stand 10 seconds safely    Standing Unsupported with Feet Together Able to place feet together independently and stand 1 minute safely    From Standing, Reach Forward with Outstretched Arm Can reach confidently >25 cm (10)    From Standing Position, Pick up Object from Floor Able to pick up shoe safely and easily    From Standing Position, Turn to Look Behind Over each Shoulder Looks behind from both sides and weight shifts well    Turn 360 Degrees Able to turn 360 degrees safely in 4 seconds or less    Standing Unsupported, Alternately Place Feet on Step/Stool Able to stand independently and safely and complete 8 steps in 20 seconds    Standing Unsupported, One Foot in Front Able to take small step independently and hold 30 seconds    Standing on One Leg Tries to lift leg/unable to hold 3 seconds but remains standing independently    Total Score 51      Dynamic Gait Index   Level Surface Normal    Change in Gait Speed Mild Impairment    Gait with Horizontal Head Turns Moderate Impairment    Gait with Vertical Head Turns Moderate Impairment    Gait and Pivot Turn Mild Impairment    Step Over Obstacle Normal    Step Around Obstacles Moderate Impairment    Steps Mild Impairment    Total Score 15           PATIENT EDUCATION: Education details: Pt educated on progress note findings and plans to progress.  Person educated: Patient Education method: Explanation Education comprehension: verbalized understanding   HOME EXERCISE PROGRAM: Access  Code: [email or web address] URL: [email or web address] Date: 03/16/2024 Prepared by: Lonni Gainer  Exercises - Side Stepping with Resistance at Ankles and Counter Support  - 1 x daily - 7 x weekly - 2 sets - 15 reps - Wide tandem with counter support as needed   - 1 x daily - 7 x weekly - 3 sets - 30 sec hold - Standing Balance with Eyes Closed  - 1 x daily - 7 x weekly - 3 sets - 30 sec hold - Sit to Stand  - 1 x daily - 7 x weekly - 2 sets - 8 reps   GOALS: Goals reviewed with patient? Yes  SHORT TERM GOALS: Target date: 05/10/2024   Patient will be independent in home exercise program to improve strength/mobility for better functional independence with ADLs. Baseline: HEP provided 10/08 Goal status: Ongoing   LONG TERM GOALS: Target date: 06/06/2024   1.  Patient will complete five times sit to stand test in < 15 seconds indicating an increased LE strength and improved balance. Baseline: 21.12 sec no UE; 11/11 25.76 Goal status: ONGOING  2.  Patient will improve ABC score to 70%   to demonstrate statistically significant improvement in mobility and quality of life as it relates to  their balance confidence.  Baseline: 58.125%, 11/11 71.8% Goal status: MET   3.  Patient will increase Berg Balance score by > 6 points to demonstrate decreased fall risk during functional activities. Baseline: 39/ 54 11/11: 51/54 Goal status: MET   4.   Patient will increase 10 meter walk test to >1.73m/s as to improve gait speed for better community ambulation and to reduce fall risk. Baseline: .58 m/s, 11/11: 1.1 m/s Goal status: MET  5.   Patient will maintain stance in condition 4 of MCTSIB test for 30 sec on average of 3 trials in order to indicate improved balance in conditions of low of minimal light and compliant surface.  Baseline: 15 sec average of 3 trials, 11/11 30 sec 3 trials Goal status: MET  6.  Patient will improve DGI score by 4 points or more to reduce fall risk and  demonstrate improved dynamic balance. Baseline: 11/11: 15/ 24 Goal status: INITIAL     ASSESSMENT:  CLINICAL IMPRESSION:  Pt presents this session for a progress note. She completed 5xSTS,10MWT, BERG, condition 4 of MCTSIB, and ABC scale. 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 Pt improved BERG score to 51/56 indicating a improvement toward moderate fall risk. Also both MCTSIB and ABC scale were improved and indicate increased confidence in overall balance. All goals above have been met except for 5xSTS. Despite improvement pt still experienced a fall so the DGI was assessed and demonstrated further room for improvement in dynamic gait and balance. Patient's condition has the potential to improve in response to therapy. Maximum improvement is yet to be obtained. The anticipated improvement is attainable and reasonable in a generally predictable time.     OBJECTIVE IMPAIRMENTS: Abnormal gait, decreased activity tolerance, decreased balance, decreased mobility, difficulty walking, and decreased strength.   ACTIVITY LIMITATIONS: standing, squatting, stairs, transfers, and locomotion level  PARTICIPATION LIMITATIONS: community activity, occupation, and yard work  PERSONAL FACTORS: Age, Time since onset of injury/illness/exacerbation, and 3+ comorbidities: DM, Anxiety, DOE, GERD with Jackhammer esophagus are also affecting patient's functional outcome.   REHAB POTENTIAL: Good  CLINICAL DECISION MAKING: Evolving/moderate complexity  EVALUATION COMPLEXITY: Moderate  PLAN:  PT FREQUENCY: 2x/week  PT DURATION: 12 weeks  PLANNED INTERVENTIONS: 97750- Physical Performance Testing, 97110-Therapeutic exercises, 97530- Therapeutic activity, [id number]- Neuromuscular re-education, 97535- Self Care, [id number]- Manual therapy, [phone]- Gait training, [phone]- Canalith repositioning, Patient/Family education, Balance training, Stair  training, and Vestibular training  PLAN FOR NEXT SESSION: hip strength and balance- sidestepping on airex beam, dynamic gait (head turns, directional changes, speed variance)   Leonor Rode, Student-SLP 04/19/2024, 11:04 AM

## 2024-04-20 ENCOUNTER — Inpatient Hospital Stay

## 2024-04-20 ENCOUNTER — Other Ambulatory Visit: Payer: Self-pay

## 2024-04-20 ENCOUNTER — Other Ambulatory Visit: Payer: Self-pay | Admitting: Nurse Practitioner

## 2024-04-20 ENCOUNTER — Encounter: Payer: Self-pay | Admitting: Nurse Practitioner

## 2024-04-20 ENCOUNTER — Inpatient Hospital Stay: Attending: Nurse Practitioner | Admitting: Nurse Practitioner

## 2024-04-20 VITALS — BP 137/80 | HR 77 | Temp 99.1°F | Resp 16 | Wt 144.0 lb

## 2024-04-20 DIAGNOSIS — D472 Monoclonal gammopathy: Secondary | ICD-10-CM | POA: Insufficient documentation

## 2024-04-20 DIAGNOSIS — E039 Hypothyroidism, unspecified: Secondary | ICD-10-CM | POA: Diagnosis not present

## 2024-04-20 DIAGNOSIS — D696 Thrombocytopenia, unspecified: Secondary | ICD-10-CM | POA: Insufficient documentation

## 2024-04-20 DIAGNOSIS — D649 Anemia, unspecified: Secondary | ICD-10-CM | POA: Diagnosis not present

## 2024-04-20 DIAGNOSIS — E785 Hyperlipidemia, unspecified: Secondary | ICD-10-CM | POA: Insufficient documentation

## 2024-04-20 DIAGNOSIS — E119 Type 2 diabetes mellitus without complications: Secondary | ICD-10-CM | POA: Insufficient documentation

## 2024-04-20 LAB — CBC WITH DIFFERENTIAL (CANCER CENTER ONLY)
Abs Immature Granulocytes: 0.01 K/uL (ref 0.00–0.07)
Basophils Absolute: 0 K/uL (ref 0.0–0.1)
Basophils Relative: 1 %
Eosinophils Absolute: 0.1 K/uL (ref 0.0–0.5)
Eosinophils Relative: 1 %
HCT: 34 % — ABNORMAL LOW (ref 36.0–46.0)
Hemoglobin: 11.2 g/dL — ABNORMAL LOW (ref 12.0–15.0)
Immature Granulocytes: 0 %
Lymphocytes Relative: 25 %
Lymphs Abs: 1.6 K/uL (ref 0.7–4.0)
MCH: 30.7 pg (ref 26.0–34.0)
MCHC: 32.9 g/dL (ref 30.0–36.0)
MCV: 93.2 fL (ref 80.0–100.0)
Monocytes Absolute: 0.4 K/uL (ref 0.1–1.0)
Monocytes Relative: 7 %
Neutro Abs: 4.1 K/uL (ref 1.7–7.7)
Neutrophils Relative %: 66 %
Platelet Count: 154 K/uL (ref 150–400)
RBC: 3.65 MIL/uL — ABNORMAL LOW (ref 3.87–5.11)
RDW: 22.5 % — ABNORMAL HIGH (ref 11.5–15.5)
WBC Count: 6.2 K/uL (ref 4.0–10.5)
nRBC: 0 % (ref 0.0–0.2)

## 2024-04-20 LAB — FOLATE: Folate: 9.8 ng/mL (ref >5.9)

## 2024-04-20 LAB — IRON AND TIBC
Iron: 128 ug/dL (ref 28–170)
Saturation Ratios: 39 % — ABNORMAL HIGH (ref 10.4–31.8)
TIBC: 328 ug/dL (ref 250–450)
UIBC: 200 ug/dL

## 2024-04-20 LAB — FERRITIN: Ferritin: 365 ng/mL — ABNORMAL HIGH (ref 11–307)

## 2024-04-20 LAB — VITAMIN B12: Vitamin B-12: 409 pg/mL (ref 180–914)

## 2024-04-20 NOTE — Progress Notes (Signed)
 Hematology/Oncology Consult note [facility]  Telephone:([phone] [phone]  Patient Care Team: Marylynn Verneita CROME, MD as PCP - General (Internal Medicine) Perla Evalene PARAS, MD as PCP - Cardiology (Cardiology) Melanee Annah BROCKS, MD as Consulting Physician (Oncology)   Name of the patient: Megan Bradley  [id number]  Apr 25, 1941   Date of visit: 04/20/24  Diagnosis- IgG kappa MGUS  Chief complaint/ Reason for visit- history of MGUS sent back from PCP due to anemia and thrombocytopenia   Heme/Onc history: Patient is a 83 year old female with past medical history significant for type 2 diabetes hyperlipidemia hypothyroidism who has been referred for biclonal gammopathy and concern for pernicious anemia.  Her recent CBC from 01/07/2021 showed white count of 6.6, H&H of 10.4/32.5 with a platelet count of 172.  B12 level was normal at 396 in July 2022.  Iron studies in July showed an elevated ferritin of 344 with an iron saturation of 17%.  Urine protein electrophoresis showed Bence-Jones protein positive IgG kappa serum protein electrophoresis showed 2 possible abnormal protein bands that may represent monoclonal immunoglobulin light chains.  Intrinsic factor antibody was negative a year ago and positive in August 2022.  Looking back at her prior labs patient's hemoglobin was around 12 until December 2021.  It was 11.4 in April 2022 and then went down to 8.9 in May 2022 when she was admitted for right total knee replacement.  Following that her hemoglobin went up to 11.3 in July 2022   Results of blood work from 01/29/2021 were as follows: CBC showed white count of 8, H&H of 11.2/34.6 with an MCV of 91 and a platelet count of 195.  Myeloma panel showed 0.9 g of IgG kappa M protein.  Serum kappa free light chain mildly elevated at 25 with an normal light chain ratio 1.5.  Haptoglobin normal at 146.  B12 levels normal at 475.  LDH normal at 135.  Interval history- she  reports doing well overall. Denies any changes in her appetite or weight. Denies any new aches/ pains anywhere.  She was referred back here for a sooner appointment by her PCP due to concerns for anemia and thrombocytopenia.  Today she reports she is overall feeling well she just stays very tired all the time.  She does endorse some dizziness with position changes, as well as balance concerns.  She is currently working with physical therapy to address her balance.  No increased shortness of breath.  No reports of blood in stool.  Discussed case with Dr. Melanee.  Today I will repeat her blood work which includes a multiple myeloma panel, light chains, CBC, iron panel, ferritin and B12, and folate.  If stable she can return back to the clinic in 6 months to see MD with repeat blood work.  If iron stores or vitamin B12 stores result low we can optimize the stores.  CBC did result and reviewed with patient her platelet count here is within normal limits at 154.  Her hemoglobin has improved to 11.2.  Discussed with patient she is in agreement with POC.    ECOG PS- 1 Pain scale- 0   Review of systems- Review of Systems  Constitutional:  Negative for chills, fever, malaise/fatigue and weight loss.  HENT:  Negative for congestion, ear discharge and nosebleeds.   Eyes:  Negative for blurred vision.  Respiratory:  Negative for cough, hemoptysis, sputum production, shortness of breath and wheezing.   Cardiovascular:  Negative for chest pain,  palpitations, orthopnea and claudication.  Gastrointestinal:  Negative for abdominal pain, blood in stool, constipation, diarrhea, heartburn, melena, nausea and vomiting.  Genitourinary:  Negative for dysuria, flank pain, frequency, hematuria and urgency.  Musculoskeletal:  Negative for back pain, joint pain and myalgias.  Skin:  Negative for rash.  Neurological:  Negative for dizziness, tingling, focal weakness, seizures, weakness and headaches.  Endo/Heme/Allergies:   Does not bruise/bleed easily.  Psychiatric/Behavioral:  Negative for depression and suicidal ideas. The patient does not have insomnia.       Allergies  Allergen Reactions   Codeine Nausea And Vomiting   Fentanyl  Nausea And Vomiting     Past Medical History:  Diagnosis Date   Abnormal cervical Papanicolaou smear 01/05/2020   Overview:   a.  Pap smears q 4 to 6 months.       b.  LEEP procedure 04-2009.      Allergy COEDINE; OXY DRUGS   Anemia RECENTLY   Anxiety YES, QUITE DIFFICULT AT TIMES   Anxiety disorder    Aortic atherosclerosis    Arthritis SOME; JUST HAD KNEE REPLACEMENT   both knees   Breast disorder in female 10/14/2019   Cervical dysplasia    a. 2010 - high grade squamous intraepithelial lesion s/p excision.   Chest pain    a. 2006 or 2007 Cath [facility]): reportedly nl cors;  b. 09/2011 ETT: nl.   Depression SOME PROBABLY   Diabetes mellitus without complication (HCC) CONTROLLED WITHOUT RX   DOE (dyspnea on exertion)    Family history of adverse reaction to anesthesia    grand daughter was hard to wake up after back surgery   GERD (gastroesophageal reflux disease) YES AND A JACKHAMMER ESOPHEGUS   Grade II diastolic dysfunction    Herpes zoster    Hyperlipidemia    Hypothyroidism    Mild aortic regurgitation    Nutcracker esophagus    Orthostatic hypotension    Pneumonia    Pre-diabetes    Ruptured left breast implant, initial encounter 07/05/2018   Skin cancer AT LEAST 15 YEARS AGO, AND CONTINUING,   Sleep apnea    does not use cpap, did not tolerate   Vertigo    Wears dentures    partial lower     Past Surgical History:  Procedure Laterality Date   AUGMENTATION MAMMAPLASTY Bilateral    BREAST EXCISIONAL BIOPSY Left    CARDIAC CATHETERIZATION  2003   Dr.Callwood, R/L heart cath,   COLONOSCOPY WITH PROPOFOL  N/A 06/12/2017   Procedure: COLONOSCOPY WITH PROPOFOL ;  Surgeon: Jinny Carmine, MD;  Location: [facility];  Service: Endoscopy;   Laterality: N/A;   dental work     ESOPHAGEAL MANOMETRY N/A 06/19/2016   Procedure: ESOPHAGEAL MANOMETRY (EM);  Surgeon: Carmine Jinny, MD;  Location: [facility];  Service: Endoscopy;  Laterality: N/A;   ESOPHAGOGASTRODUODENOSCOPY (EGD) WITH PROPOFOL  N/A 04/03/2017   Procedure: ESOPHAGOGASTRODUODENOSCOPY (EGD) WITH PROPOFOL ;  Surgeon: Jinny Carmine, MD;  Location: [facility];  Service: Endoscopy;  Laterality: N/A;  diabetic - diet controlled   EYE SURGERY  CATARACTS REMOVED - 2   THYROIDECTOMY  05/15/2015   [facility]   TONSILLECTOMY     TOTAL KNEE ARTHROPLASTY Right 10/25/2020   Procedure: RIGHT TOTAL KNEE ARTHROPLASTY;  Surgeon: Marchia Drivers, MD;  Location: [facility];  Service: Orthopedics;  Laterality: Right;    Social History   Socioeconomic History   Marital status: Married    Spouse name: [name]   Number of children: 1   Years of  education: Not on file   Highest education level: Bachelor's degree (e.g., BA, AB, BS)  Occupational History   Not on file  Tobacco Use   Smoking status: Never   Smokeless tobacco: Never  Vaping Use   Vaping status: Never Used  Substance and Sexual Activity   Alcohol use: Yes    Comment: wine once a week   Drug use: No   Sexual activity: Never  Other Topics Concern   Not on file  Social History Narrative   Lives in [place].  Active but doesn't exercise. Retired from [employer].   Social Drivers of Corporate Investment Banker Strain: Low Risk  (02/08/2024)   Overall Financial Resource Strain (CARDIA)    Difficulty of Paying Living Expenses: Not hard at all  Food Insecurity: No Food Insecurity (02/08/2024)   Hunger Vital Sign    Worried About Running Out of Food in the Last Year: Never true    Ran Out of Food in the Last Year: Never true  Transportation Needs: No Transportation Needs (02/08/2024)   PRAPARE - Administrator, Civil Service (Medical): No    Lack of Transportation (Non-Medical): No  Physical Activity: Unknown  (02/08/2024)   Exercise Vital Sign    Days of Exercise per Week: 3 days    Minutes of Exercise per Session: Not on file  Recent Concern: Physical Activity - Insufficiently Active (11/11/2023)   Exercise Vital Sign    Days of Exercise per Week: 1 day    Minutes of Exercise per Session: 20 min  Stress: Stress Concern Present (02/08/2024)   [employer] of Occupational Health - Occupational Stress Questionnaire    Feeling of Stress: To some extent  Social Connections: Unknown (02/08/2024)   Social Connection and Isolation Panel    Frequency of Communication with Friends and Family: More than three times a week    Frequency of Social Gatherings with Friends and Family: Three times a week    Attends Religious Services: Not on file    Active Member of Clubs or Organizations: Yes    Attends Club or Organization Meetings: More than 4 times per year    Marital Status: Married  Intimate Partner Violence: Not At Risk (11/11/2023)   Humiliation, Afraid, Rape, and Kick questionnaire    Fear of Current or Ex-Partner: No    Emotionally Abused: No    Physically Abused: No    Sexually Abused: No    Family History  Problem Relation Age of Onset   Heart attack Mother    Alcohol abuse Mother    Mental illness Mother        died in her 55's of alzheimer's Dementia   Heart disease Mother    Anxiety disorder Mother    Heart attack Father    Heart disease Father        AMI - died @ 32.   Hypertension Father    Colon cancer Paternal Grandmother 35   Kidney disease Brother    Kidney disease Brother    Esophageal cancer Neg Hx    Stomach cancer Neg Hx    Colon polyps Neg Hx      Current Outpatient Medications:    ACCU-CHEK GUIDE TEST test strip, USE TO CHECK BLOOD SUGAR ONCE  DAILY, Disp: 100 strip, Rfl: 2   ALPRAZolam  (XANAX ) 0.5 MG tablet, Take 1 tablet by mouth twice daily as needed for anxiety, Disp: 60 tablet, Rfl: 5   busPIRone  (BUSPAR ) 10 MG tablet, Take  1 tablet (10 mg total) by mouth 3  (three) times daily., Disp: 90 tablet, Rfl: 2   DUPIXENT 300 MG/2ML SOPN, Every other Friday, Disp: , Rfl:    ergocalciferol  (DRISDOL ) 1.25 MG (50000 UT) capsule, Take 1 capsule (50,000 Units total) by mouth once a week., Disp: 12 capsule, Rfl: 1   ezetimibe  (ZETIA ) 10 MG tablet, Take 1 tablet (10 mg total) by mouth daily., Disp: 90 tablet, Rfl: 3   hyoscyamine  (ANASPAZ ) 0.125 MG TBDP disintergrating tablet, PLACE ONE TABLET UNDER THE TONGUE EVERY SIX HOURS AS NEEDED, Disp: 30 tablet, Rfl: 0   isosorbide  mononitrate (IMDUR ) 30 MG 24 hr tablet, TAKE 1 TABLET BY MOUTH DAILY, Disp: 100 tablet, Rfl: 2   levothyroxine  (SYNTHROID ) 125 MCG tablet, Take 1 tablet (125 mcg total) by mouth daily before breakfast., Disp: 90 tablet, Rfl: 0   meclizine  (ANTIVERT ) 25 MG tablet, TAKE 1 TABLET BY MOUTH THREE TIMES DAILY AS NEEDED FOR VERTIGO, Disp: 30 tablet, Rfl: 0   omeprazole  (PRILOSEC) 40 MG capsule, Take 1 capsule (40 mg total) by mouth 2 (two) times daily., Disp: 180 capsule, Rfl: 3   raloxifene  (EVISTA ) 60 MG tablet, TAKE 1 TABLET BY MOUTH DAILY, Disp: 100 tablet, Rfl: 3   rosuvastatin  (CRESTOR ) 20 MG tablet, TAKE 1 TABLET BY MOUTH EVERY  OTHER DAY, Disp: 50 tablet, Rfl: 2   sertraline  (ZOLOFT ) 100 MG tablet, TAKE 2 TABLETS BY MOUTH DAILY, Disp: 180 tablet, Rfl: 3 No current facility-administered medications for this visit.  Facility-Administered Medications Ordered in Other Visits:    clindamycin  (CLEOCIN ) IVPB 600 mg, 600 mg, Intravenous, Once, Marchia Drivers, MD  Physical exam:  Vitals:   04/20/24 1420  BP: 137/80  Pulse: 77  Resp: 16  Temp: 99.1 F (37.3 C)  TempSrc: Tympanic  SpO2: 99%  Weight: 144 lb (65.3 kg)   Physical Exam Cardiovascular:     Rate and Rhythm: Normal rate and regular rhythm.     Heart sounds: Normal heart sounds.  Pulmonary:     Effort: Pulmonary effort is normal.     Breath sounds: Normal breath sounds.  Abdominal:     General: Bowel sounds are normal.      Palpations: Abdomen is soft.  Skin:    General: Skin is warm and dry.  Neurological:     Mental Status: She is alert and oriented to person, place, and time.      I have personally reviewed labs listed below:    Latest Ref Rng & Units 11/10/2023    3:54 PM  CMP  Glucose 70 - 99 mg/dL 82   BUN 6 - 23 mg/dL 16   Creatinine 9.59 - 1.20 mg/dL 9.06   Sodium 864 - 854 mEq/L 140   Potassium 3.5 - 5.1 mEq/L 4.5   Chloride 96 - 112 mEq/L 104   CO2 19 - 32 mEq/L 29   Calcium  8.4 - 10.5 mg/dL 9.5   Total Protein 6.0 - 8.3 g/dL 7.4   Total Bilirubin 0.2 - 1.2 mg/dL 0.7   Alkaline Phos 39 - 117 U/L 44   AST 0 - 37 U/L 17   ALT 0 - 35 U/L 12       Latest Ref Rng & Units 04/20/2024    2:07 PM  CBC  WBC 4.0 - 10.5 K/uL 6.2   Hemoglobin 12.0 - 15.0 g/dL 88.7   Hematocrit 63.9 - 46.0 % 34.0   Platelets 150 - 400 K/uL 154      Assessment  and plan- Patient is a 83 y.o. female here for routine f/u of IgG kappa MGUS  Hemoglobin remains stable between 11-12 today. Platelets at 154   Anemia:  Stable Hg 11.2 today ordered cbc with diff which was stable today, myeloma panel, light chains, vitamin b12, folate, ferritin, Iron and TIBC panel today and still pending.  If iron stores result low or vitamin b12 can optimize stores. Otherwise f/u with MD and repeat labs in 6 mths.    Thrombocytopenia: Looks like an isolated event repeat platelets 154 today.  Repeat myeloma panel, light chains pending.  F/U with repeat labs and see MD in 6 mths.     Morna Husband AGNP-C [facility] 04/20/2024 3:21 PM

## 2024-04-21 ENCOUNTER — Ambulatory Visit: Admitting: Physical Therapy

## 2024-04-21 DIAGNOSIS — R269 Unspecified abnormalities of gait and mobility: Secondary | ICD-10-CM

## 2024-04-21 DIAGNOSIS — R2681 Unsteadiness on feet: Secondary | ICD-10-CM

## 2024-04-21 DIAGNOSIS — R2689 Other abnormalities of gait and mobility: Secondary | ICD-10-CM

## 2024-04-21 DIAGNOSIS — M6281 Muscle weakness (generalized): Secondary | ICD-10-CM

## 2024-04-21 DIAGNOSIS — R262 Difficulty in walking, not elsewhere classified: Secondary | ICD-10-CM

## 2024-04-21 LAB — KAPPA/LAMBDA LIGHT CHAINS
Kappa free light chain: 15.2 mg/L (ref 3.3–19.4)
Kappa, lambda light chain ratio: 1.21 (ref 0.26–1.65)
Lambda free light chains: 12.6 mg/L (ref 5.7–26.3)

## 2024-04-21 NOTE — Therapy (Signed)
 OUTPATIENT PHYSICAL THERAPY NEURO TREATMENT   Patient Name: Megan Bradley MRN: [id number] DOB:1941-02-23, 83 y.o., female Today's Date: 04/21/2024   PCP: Marylynn Verneita CROME, MD  REFERRING PROVIDER: Marylynn Verneita CROME, MD  END OF SESSION:  PT End of Session - 04/21/24 1148     Visit Number 11    Number of Visits 24    Date for Recertification  06/06/24    Progress Note Due on Visit 10    PT Start Time 1110    PT Stop Time 1145    PT Time Calculation (min) 35 min    Equipment Utilized During Treatment Gait belt    Activity Tolerance Patient tolerated treatment well;No increased pain;Treatment limited secondary to medical complications (Comment)   Upper respiratory congestion   Behavior During Therapy [facility] for tasks assessed/performed          Past Medical History:  Diagnosis Date   Abnormal cervical Papanicolaou smear 01/05/2020   Overview:   a.  Pap smears q 4 to 6 months.       b.  LEEP procedure 04-2009.      Allergy COEDINE; OXY DRUGS   Anemia RECENTLY   Anxiety YES, QUITE DIFFICULT AT TIMES   Anxiety disorder    Aortic atherosclerosis    Arthritis SOME; JUST HAD KNEE REPLACEMENT   both knees   Breast disorder in female 10/14/2019   Cervical dysplasia    a. 2010 - high grade squamous intraepithelial lesion s/p excision.   Chest pain    a. 2006 or 2007 [facility] reportedly nl cors;  b. 09/2011 ETT: nl.   Depression SOME PROBABLY   Diabetes mellitus without complication (HCC) CONTROLLED WITHOUT RX   DOE (dyspnea on exertion)    Family history of adverse reaction to anesthesia    grand daughter was hard to wake up after back surgery   GERD (gastroesophageal reflux disease) YES AND A JACKHAMMER ESOPHEGUS   Grade II diastolic dysfunction    Herpes zoster    Hyperlipidemia    Hypothyroidism    Mild aortic regurgitation    Nutcracker esophagus    Orthostatic hypotension    Pneumonia    Pre-diabetes    Ruptured left breast implant, initial encounter 07/05/2018    Skin cancer AT LEAST 15 YEARS AGO, AND CONTINUING,   Sleep apnea    does not use cpap, did not tolerate   Vertigo    Wears dentures    partial lower   Past Surgical History:  Procedure Laterality Date   AUGMENTATION MAMMAPLASTY Bilateral    BREAST EXCISIONAL BIOPSY Left    CARDIAC CATHETERIZATION  2003   Dr.Callwood, R/L heart cath,   COLONOSCOPY WITH PROPOFOL  N/A 06/12/2017   Procedure: COLONOSCOPY WITH PROPOFOL ;  Surgeon: Jinny Carmine, MD;  Location: [facility];  Service: Endoscopy;  Laterality: N/A;   dental work     ESOPHAGEAL MANOMETRY N/A 06/19/2016   Procedure: ESOPHAGEAL MANOMETRY (EM);  Surgeon: Carmine Jinny, MD;  Location: [facility];  Service: Endoscopy;  Laterality: N/A;   ESOPHAGOGASTRODUODENOSCOPY (EGD) WITH PROPOFOL  N/A 04/03/2017   Procedure: ESOPHAGOGASTRODUODENOSCOPY (EGD) WITH PROPOFOL ;  Surgeon: Jinny Carmine, MD;  Location: [facility];  Service: Endoscopy;  Laterality: N/A;  diabetic - diet controlled   EYE SURGERY  CATARACTS REMOVED - 2   THYROIDECTOMY  05/15/2015   [facility]   TONSILLECTOMY     TOTAL KNEE ARTHROPLASTY Right 10/25/2020   Procedure: RIGHT TOTAL KNEE ARTHROPLASTY;  Surgeon: Marchia Drivers, MD;  Location: [facility];  Service: Orthopedics;  Laterality: Right;   Patient Active Problem List   Diagnosis Date Noted   Insomnia due to mental condition 02/12/2024   Schatzki's ring of distal esophagus 08/11/2023   Travel advice encounter 05/12/2023   Lumbar radiculopathy 07/29/2022   CKD stage 3 secondary to diabetes (HCC) 05/17/2022   Zenkers diverticulum 05/05/2022   Presbyesophagus 01/30/2022   Prurigo nodularis 07/30/2021   Abnormal breast finding 07/30/2021   MGUS (monoclonal gammopathy of unknown significance) 01/22/2021   Anemia 01/09/2021   S/P TKR (total knee replacement) using cement, right 10/25/2020   Preoperative clearance 09/18/2020   Aortic atherosclerosis 07/10/2020   Exertional dyspnea 05/20/2020   Balance problem  05/12/2020   History of recent fall 05/12/2020   Type 2 DM with diabetic neuropathy affecting both sides of body (HCC) 01/05/2020   Bilateral chronic knee pain 04/11/2018   S/P breast implant, silicone 03/11/2018   Osteoporosis of femur without pathological fracture 03/11/2018   Nutcracker esophagus 01/01/2018   Tubular adenoma of colon    Squamous cell skin cancer 07/21/2016   Dysphagia 04/08/2016   RBBB    Chest pain    OSA (obstructive sleep apnea) 11/22/2013   Hypovitaminosis D 01/11/2013   History of pernicious anemia 01/11/2013   Hyperlipidemia associated with type 2 diabetes mellitus (HCC) 09/04/2011   Multiple thyroid  nodules 08/27/2011   Cervical dysplasia 08/27/2011   Screening for breast cancer 03/13/2011   Iatrogenic hypothyroidism    GERD (gastroesophageal reflux disease)    Anxiety with obsessional features    Basal cell carcinoma of face 10/03/2010    ONSET DATE: 02/09/24  REFERRING DIAG: R26.89 (ICD-10-CM) - Balance problem   THERAPY DIAG:  Abnormality of gait and mobility  Difficulty in walking, not elsewhere classified  Muscle weakness (generalized)  Unsteadiness on feet  Other abnormalities of gait and mobility  Rationale for Evaluation and Treatment: Rehabilitation  SUBJECTIVE:                                                                                                                                                                                             SUBJECTIVE STATEMENT:  Pt reports waking up at 6 am this morning with a cramp in her foot. She was able to get back to sleep, but feels like her balance has been off since.  Eval: Pt reports she has had PT previously. Pt has pinched nerve in her spine that she [facility] shots [facility] at [facility]. Pt balance is very challenging in dark environments. Pt has had multiple falls where she has no recall of why she fell. Pt has difficulty walking straight most  of the time and frequently feels like she is  going to fall. Pt also has difficulty turning. Pt reports no dizziness just general imbalance.  Pt accompanied by: self  PERTINENT HISTORY: Knee replacement, low back pain, DM, HLD,   PAIN:  Are you having pain? Has pain in back occasionally, none in seated position at present   PRECAUTIONS: None  RED FLAGS: None   WEIGHT BEARING RESTRICTIONS: No  FALLS: Has patient fallen in last 6 months? Yes. Number of falls 2-3  LIVING ENVIRONMENT: Lives with: lives with their spouse Lives in: House/apartment Stairs: yes but level entry and does not need to go upstairs in her home  Has following equipment at home: Walker - 2 wheeled and does not regularly use AD  PLOF: Independent and Independent with basic ADLs  PATIENT GOALS: Improve balance and reduce fall risk   OBJECTIVE:  Note: Objective measures were completed at Evaluation unless otherwise noted.  COGNITION: Overall cognitive status: Within functional limits for tasks assessed   SENSATION: Not tested  LOWER EXTREMITY ROM:   WNL for tasks assessed  LOWER EXTREMITY MMT:    MMT Right Eval Left Eval  Hip flexion 4 4  Hip extension    Hip abduction 4 4  Hip adduction 4+ 4+  Hip internal rotation    Hip external rotation    Knee flexion 4+ 4+  Knee extension 4+ 4+  Ankle dorsiflexion    Ankle plantarflexion    Ankle inversion    Ankle eversion    (Blank rows = not tested)   TRANSFERS: Sit to stand: Complete Independence  Assistive device utilized: knee valgus bilateral and None     Stand to sit: Complete Independence and knee valgus noted  Assistive device utilized: None     Chair to chair: Complete Independence  Assistive device utilized: None       RAMP:  Not tested  CURB:  Not tested  STAIRS: Not tested GAIT: Findings: Gait Characteristics: decreased arm swing- Right, decreased arm swing- Left, decreased step length- Right, decreased step length- Left, decreased stride length, decreased hip/knee  flexion- Right, and decreased hip/knee flexion- Left, Distance walked: 30 ft, and Comments: 1 LOB with step recovery   FUNCTIONAL TESTS:  MCTSIB: Condition 1: Avg of 3 trials: 30 sec, Condition 2: Avg of 3 trials: 30 sec, Condition 3: Avg of 3 trials: 30 sec, Condition 4: Avg of 3 trials: 15 sec, and Total Score: 105/120 5XSTS: 21.12 sec no UE assist, B knee valgus throughout  10 Meter Walk Test: Patient instructed to walk 10 meters (32.8 ft) as quickly and as safely as possible at their normal speed Results: .58 m/s (17.06 seconds )  Cut off scores:   Household Ambulator  < 0.4 m/s  Limited Community Ambulator  0.4 - 0.8 m/s  [employer]  > 0.8 m/s  Increased fall risk  < 1.52m/s  Crossing a Street  >1.69m/s  MCID 0.05 m/s (small), 0.13 m/s (moderate), 0.06 m/s (significant)  (ANPTA Core Set of Outcome Measures for Adults with Neurologic Conditions, 2018)       PATIENT SURVEYS:   ABC scale: The Activities-Specific Balance Confidence (ABC) Scale 0% 10 20 30  40 50 60 70 80 90 100% No confidence<->completely confident  "How confident are you that you will not lose your balance or become unsteady when you . . .   Date tested   Walk around the house 50%  2. Walk up or down stairs 70%  3.  Bend over and pick up a slipper from in front of a closet floor 60%  4. Reach for a small can off a shelf at eye level 90%  5. Stand on tip toes and reach for something above your head 70%  6. Stand on a chair and reach for something 90%  7. Sweep the floor 90%  8. Walk outside the house to a car parked in the driveway 50%  9. Get into or out of a car 60%  10. Walk across a parking lot to the mall 50%  11. Walk up or down a ramp 90%  12. Walk in a crowded mall where people rapidly walk past you 60%  13. Are bumped into by people as you walk through the mall 50%  14. Step onto or off of an escalator while you are holding onto the railing 50%  15. Step onto or off an escalator while  holding onto parcels such that you cannot hold onto the railing 0%  16. Walk outside on icy sidewalks 0%  Total: #/16 58.125%                                                                                                                                 TREATMENT DATE: 04/21/24   Pt session was cut a little short today due to pt arriving late to scheduled appointment time   TA- To improve functional movements patterns for everyday tasks    Fwd walking with horizontal head turns followed by bwd walking x 4 laps in the hallway  Sit to stand w/ GTB around distal thigh 2 x 10 -Verbal and tactile cues to maintain knees in line with feet, tendency to adduct knees on assent and decent   NMR: To facilitate reeducation of movement, balance, posture, coordination, and/or proprioception/kinesthetic sense.   1 foot airex other on step with ball handoff laterally x 10 ea side and ea LE  Side steps on airex 2 x 8 laps with intermittent UE support but goal of no support   Unless otherwise stated, CGA was provided and gait belt donned in order to ensure pt safety   PATIENT EDUCATION: Education details:Pt educated throughout session about proper posture and technique with exercises. Improved exercise technique, movement at target joints, use of target muscles after min to mod verbal, visual, tactile cues. Person educated: Patient Education method: Explanation Education comprehension: verbalized understanding   HOME EXERCISE PROGRAM: Access Code: [email or web address] URL: [email or web address] Date: 03/16/2024 Prepared by: Lonni Gainer  Exercises - Side Stepping with Resistance at Ankles and Counter Support  - 1 x daily - 7 x weekly - 2 sets - 15 reps - Wide tandem with counter support as needed   - 1 x daily - 7 x weekly - 3 sets - 30 sec hold - Standing Balance with Eyes Closed  - 1 x daily - 7 x weekly - 3 sets - 30 sec hold - Sit  to Stand  - 1 x daily - 7 x weekly - 2 sets - 8  reps   GOALS: Goals reviewed with patient? Yes  SHORT TERM GOALS: Target date: 05/10/2024   Patient will be independent in home exercise program to improve strength/mobility for better functional independence with ADLs. Baseline: HEP provided 10/08 Goal status: Ongoing   LONG TERM GOALS: Target date: 06/06/2024   1.  Patient will complete five times sit to stand test in < 15 seconds indicating an increased LE strength and improved balance. Baseline: 21.12 sec no UE; 11/11 25.76 Goal status: ONGOING  2.  Patient will improve ABC score to 70%   to demonstrate statistically significant improvement in mobility and quality of life as it relates to their balance confidence.  Baseline: 58.125%, 11/11 71.8% Goal status: MET   3.  Patient will increase Berg Balance score by > 6 points to demonstrate decreased fall risk during functional activities. Baseline: 39/ 54 11/11: 51/54 Goal status: MET   4.   Patient will increase 10 meter walk test to >1.37m/s as to improve gait speed for better community ambulation and to reduce fall risk. Baseline: .58 m/s, 11/11: 1.1 m/s Goal status: MET  5.   Patient will maintain stance in condition 4 of MCTSIB test for 30 sec on average of 3 trials in order to indicate improved balance in conditions of low of minimal light and compliant surface.  Baseline: 15 sec average of 3 trials, 11/11 30 sec 3 trials Goal status: MET  6.  Patient will improve DGI score by 4 points or more to reduce fall risk and demonstrate improved dynamic balance. Baseline: 11/11: 15/ 24 Goal status: INITIAL     ASSESSMENT:  CLINICAL IMPRESSION:  Pt presents with good motivation for completion of physical therapy activities. This session focused on dynamic balance and gait. Pt completed fwd walking with horizontal head turns with occasional slowing that required verbal cueing to maintain pace. Pt demonstrated decreased step length and speed with bwd walking, but able to  maintain balance throughout. Rotational balance still proves challenging with occasional LOB and UE assist. Pt will continue to benefit from skilled physical therapy intervention to address impairments, improved QOL, and attain therapy goals.   OBJECTIVE IMPAIRMENTS: Abnormal gait, decreased activity tolerance, decreased balance, decreased mobility, difficulty walking, and decreased strength.   ACTIVITY LIMITATIONS: standing, squatting, stairs, transfers, and locomotion level  PARTICIPATION LIMITATIONS: community activity, occupation, and yard work  PERSONAL FACTORS: Age, Time since onset of injury/illness/exacerbation, and 3+ comorbidities: DM, Anxiety, DOE, GERD with Jackhammer esophagus are also affecting patient's functional outcome.   REHAB POTENTIAL: Good  CLINICAL DECISION MAKING: Evolving/moderate complexity  EVALUATION COMPLEXITY: Moderate  PLAN:  PT FREQUENCY: 2x/week  PT DURATION: 12 weeks  PLANNED INTERVENTIONS: 97750- Physical Performance Testing, 97110-Therapeutic exercises, 97530- Therapeutic activity, [id number]- Neuromuscular re-education, 97535- Self Care, [id number]- Manual therapy, [phone]- Gait training, [phone]- Canalith repositioning, Patient/Family education, Balance training, Stair training, and Vestibular training  PLAN FOR NEXT SESSION: hip strength and balance- dynamic gait (head turns, directional changes, speed variance) - Rotational balance    Leonor Rode, SPT

## 2024-04-23 LAB — MULTIPLE MYELOMA PANEL, SERUM
Albumin SerPl Elph-Mcnc: 3.5 g/dL (ref 2.9–4.4)
Albumin/Glob SerPl: 1.1 (ref 0.7–1.7)
Alpha 1: 0.3 g/dL (ref 0.0–0.4)
Alpha2 Glob SerPl Elph-Mcnc: 0.6 g/dL (ref 0.4–1.0)
B-Globulin SerPl Elph-Mcnc: 2.1 g/dL — ABNORMAL HIGH (ref 0.7–1.3)
Gamma Glob SerPl Elph-Mcnc: 0.4 g/dL (ref 0.4–1.8)
Globulin, Total: 3.4 g/dL (ref 2.2–3.9)
IgA: 210 mg/dL (ref 64–422)
IgG (Immunoglobin G), Serum: 1828 mg/dL — ABNORMAL HIGH (ref 586–1602)
IgM (Immunoglobulin M), Srm: 56 mg/dL (ref 26–217)
M Protein SerPl Elph-Mcnc: 1.3 g/dL — ABNORMAL HIGH
Total Protein ELP: 6.9 g/dL (ref 6.0–8.5)

## 2024-04-25 ENCOUNTER — Encounter: Payer: Self-pay | Admitting: Oncology

## 2024-04-25 ENCOUNTER — Ambulatory Visit: Admitting: Physical Therapy

## 2024-04-25 DIAGNOSIS — R262 Difficulty in walking, not elsewhere classified: Secondary | ICD-10-CM

## 2024-04-25 DIAGNOSIS — R269 Unspecified abnormalities of gait and mobility: Secondary | ICD-10-CM | POA: Diagnosis not present

## 2024-04-25 DIAGNOSIS — R2689 Other abnormalities of gait and mobility: Secondary | ICD-10-CM

## 2024-04-25 DIAGNOSIS — M6281 Muscle weakness (generalized): Secondary | ICD-10-CM

## 2024-04-25 DIAGNOSIS — R2681 Unsteadiness on feet: Secondary | ICD-10-CM

## 2024-04-25 NOTE — Therapy (Signed)
 OUTPATIENT PHYSICAL THERAPY NEURO TREATMENT   Patient Name: Megan Bradley MRN: [id number] DOB:September 06, 1940, 83 y.o., female Today's Date: 04/25/2024   PCP: Marylynn Verneita CROME, MD  REFERRING PROVIDER: Marylynn Verneita CROME, MD  END OF SESSION:  PT End of Session - 04/25/24 1430     Visit Number 12    Number of Visits 24    Date for Recertification  06/06/24    Progress Note Due on Visit 10    PT Start Time 1445    PT Stop Time 1527    PT Time Calculation (min) 42 min    Equipment Utilized During Treatment Gait belt    Activity Tolerance Patient tolerated treatment well;No increased pain;Treatment limited secondary to medical complications (Comment)   Upper respiratory congestion   Behavior During Therapy [facility] for tasks assessed/performed          Past Medical History:  Diagnosis Date   Abnormal cervical Papanicolaou smear 01/05/2020   Overview:   a.  Pap smears q 4 to 6 months.       b.  LEEP procedure 04-2009.      Allergy COEDINE; OXY DRUGS   Anemia RECENTLY   Anxiety YES, QUITE DIFFICULT AT TIMES   Anxiety disorder    Aortic atherosclerosis    Arthritis SOME; JUST HAD KNEE REPLACEMENT   both knees   Breast disorder in female 10/14/2019   Cervical dysplasia    a. 2010 - high grade squamous intraepithelial lesion s/p excision.   Chest pain    a. 2006 or 2007 [facility]): reportedly nl cors;  b. 09/2011 ETT: nl.   Depression SOME PROBABLY   Diabetes mellitus without complication (HCC) CONTROLLED WITHOUT RX   DOE (dyspnea on exertion)    Family history of adverse reaction to anesthesia    grand daughter was hard to wake up after back surgery   GERD (gastroesophageal reflux disease) YES AND A JACKHAMMER ESOPHEGUS   Grade II diastolic dysfunction    Herpes zoster    Hyperlipidemia    Hypothyroidism    Mild aortic regurgitation    Nutcracker esophagus    Orthostatic hypotension    Pneumonia    Pre-diabetes    Ruptured left breast implant, initial encounter 07/05/2018    Skin cancer AT LEAST 15 YEARS AGO, AND CONTINUING,   Sleep apnea    does not use cpap, did not tolerate   Vertigo    Wears dentures    partial lower   Past Surgical History:  Procedure Laterality Date   AUGMENTATION MAMMAPLASTY Bilateral    BREAST EXCISIONAL BIOPSY Left    CARDIAC CATHETERIZATION  2003   Dr.Callwood, R/L heart cath,   COLONOSCOPY WITH PROPOFOL  N/A 06/12/2017   Procedure: COLONOSCOPY WITH PROPOFOL ;  Surgeon: Jinny Carmine, MD;  Location: [facility];  Service: Endoscopy;  Laterality: N/A;   dental work     ESOPHAGEAL MANOMETRY N/A 06/19/2016   Procedure: ESOPHAGEAL MANOMETRY (EM);  Surgeon: Carmine Jinny, MD;  Location: [facility];  Service: Endoscopy;  Laterality: N/A;   ESOPHAGOGASTRODUODENOSCOPY (EGD) WITH PROPOFOL  N/A 04/03/2017   Procedure: ESOPHAGOGASTRODUODENOSCOPY (EGD) WITH PROPOFOL ;  Surgeon: Jinny Carmine, MD;  Location: [facility];  Service: Endoscopy;  Laterality: N/A;  diabetic - diet controlled   EYE SURGERY  CATARACTS REMOVED - 2   THYROIDECTOMY  05/15/2015   [facility]   TONSILLECTOMY     TOTAL KNEE ARTHROPLASTY Right 10/25/2020   Procedure: RIGHT TOTAL KNEE ARTHROPLASTY;  Surgeon: Marchia Drivers, MD;  Location: [facility];  Service: Orthopedics;  Laterality: Right;   Patient Active Problem List   Diagnosis Date Noted   Insomnia due to mental condition 02/12/2024   Schatzki's ring of distal esophagus 08/11/2023   Travel advice encounter 05/12/2023   Lumbar radiculopathy 07/29/2022   CKD stage 3 secondary to diabetes (HCC) 05/17/2022   Zenkers diverticulum 05/05/2022   Presbyesophagus 01/30/2022   Prurigo nodularis 07/30/2021   Abnormal breast finding 07/30/2021   MGUS (monoclonal gammopathy of unknown significance) 01/22/2021   Anemia 01/09/2021   S/P TKR (total knee replacement) using cement, right 10/25/2020   Preoperative clearance 09/18/2020   Aortic atherosclerosis 07/10/2020   Exertional dyspnea 05/20/2020   Balance problem  05/12/2020   History of recent fall 05/12/2020   Type 2 DM with diabetic neuropathy affecting both sides of body (HCC) 01/05/2020   Bilateral chronic knee pain 04/11/2018   S/P breast implant, silicone 03/11/2018   Osteoporosis of femur without pathological fracture 03/11/2018   Nutcracker esophagus 01/01/2018   Tubular adenoma of colon    Squamous cell skin cancer 07/21/2016   Dysphagia 04/08/2016   RBBB    Chest pain    OSA (obstructive sleep apnea) 11/22/2013   Hypovitaminosis D 01/11/2013   History of pernicious anemia 01/11/2013   Hyperlipidemia associated with type 2 diabetes mellitus (HCC) 09/04/2011   Multiple thyroid  nodules 08/27/2011   Cervical dysplasia 08/27/2011   Screening for breast cancer 03/13/2011   Iatrogenic hypothyroidism    GERD (gastroesophageal reflux disease)    Anxiety with obsessional features    Basal cell carcinoma of face 10/03/2010    ONSET DATE: 02/09/24  REFERRING DIAG: R26.89 (ICD-10-CM) - Balance problem   THERAPY DIAG:  Abnormality of gait and mobility  Difficulty in walking, not elsewhere classified  Muscle weakness (generalized)  Unsteadiness on feet  Other abnormalities of gait and mobility  Rationale for Evaluation and Treatment: Rehabilitation  SUBJECTIVE:                                                                                                                                                                                             SUBJECTIVE STATEMENT:  Pt reports waking up at 6 am this morning with a cramp in her foot. She was able to get back to sleep, but feels like her balance has been off since.  Eval: Pt reports she has had PT previously. Pt has pinched nerve in her spine that she gets shots [facility] at [facility]. Pt balance is very challenging in dark environments. Pt has had multiple falls where she has no recall of why she fell. Pt has difficulty walking straight most  of the time and frequently feels like she is  going to fall. Pt also has difficulty turning. Pt reports no dizziness just general imbalance.  Pt accompanied by: self  PERTINENT HISTORY: Knee replacement, low back pain, DM, HLD,   PAIN:  Are you having pain? Has pain in back occasionally, none in seated position at present   PRECAUTIONS: None  RED FLAGS: None   WEIGHT BEARING RESTRICTIONS: No  FALLS: Has patient fallen in last 6 months? Yes. Number of falls 2-3  LIVING ENVIRONMENT: Lives with: lives with their spouse Lives in: House/apartment Stairs: yes but level entry and does not need to go upstairs in her home  Has following equipment at home: Walker - 2 wheeled and does not regularly use AD  PLOF: Independent and Independent with basic ADLs  PATIENT GOALS: Improve balance and reduce fall risk   OBJECTIVE:  Note: Objective measures were completed at Evaluation unless otherwise noted.  COGNITION: Overall cognitive status: Within functional limits for tasks assessed   SENSATION: Not tested  LOWER EXTREMITY ROM:   WNL for tasks assessed  LOWER EXTREMITY MMT:    MMT Right Eval Left Eval  Hip flexion 4 4  Hip extension    Hip abduction 4 4  Hip adduction 4+ 4+  Hip internal rotation    Hip external rotation    Knee flexion 4+ 4+  Knee extension 4+ 4+  Ankle dorsiflexion    Ankle plantarflexion    Ankle inversion    Ankle eversion    (Blank rows = not tested)   TRANSFERS: Sit to stand: Complete Independence  Assistive device utilized: knee valgus bilateral and None     Stand to sit: Complete Independence and knee valgus noted  Assistive device utilized: None     Chair to chair: Complete Independence  Assistive device utilized: None       RAMP:  Not tested  CURB:  Not tested  STAIRS: Not tested GAIT: Findings: Gait Characteristics: decreased arm swing- Right, decreased arm swing- Left, decreased step length- Right, decreased step length- Left, decreased stride length, decreased hip/knee  flexion- Right, and decreased hip/knee flexion- Left, Distance walked: 30 ft, and Comments: 1 LOB with step recovery   FUNCTIONAL TESTS:  MCTSIB: Condition 1: Avg of 3 trials: 30 sec, Condition 2: Avg of 3 trials: 30 sec, Condition 3: Avg of 3 trials: 30 sec, Condition 4: Avg of 3 trials: 15 sec, and Total Score: 105/120 5XSTS: 21.12 sec no UE assist, B knee valgus throughout  10 Meter Walk Test: Patient instructed to walk 10 meters (32.8 ft) as quickly and as safely as possible at their normal speed Results: .58 m/s (17.06 seconds )  Cut off scores:   Household Ambulator  < 0.4 m/s  Limited Community Ambulator  0.4 - 0.8 m/s  [employer]  > 0.8 m/s  Increased fall risk  < 1.49m/s  Crossing a Street  >1.63m/s  MCID 0.05 m/s (small), 0.13 m/s (moderate), 0.06 m/s (significant)  (ANPTA Core Set of Outcome Measures for Adults with Neurologic Conditions, 2018)       PATIENT SURVEYS:   ABC scale: The Activities-Specific Balance Confidence (ABC) Scale 0% 10 20 30  40 50 60 70 80 90 100% No confidence<->completely confident  "How confident are you that you will not lose your balance or become unsteady when you . . .   Date tested   Walk around the house 50%  2. Walk up or down stairs 70%  3.  Bend over and pick up a slipper from in front of a closet floor 60%  4. Reach for a small can off a shelf at eye level 90%  5. Stand on tip toes and reach for something above your head 70%  6. Stand on a chair and reach for something 90%  7. Sweep the floor 90%  8. Walk outside the house to a car parked in the driveway 50%  9. Get into or out of a car 60%  10. Walk across a parking lot to the mall 50%  11. Walk up or down a ramp 90%  12. Walk in a crowded mall where people rapidly walk past you 60%  13. Are bumped into by people as you walk through the mall 50%  14. Step onto or off of an escalator while you are holding onto the railing 50%  15. Step onto or off an escalator while  holding onto parcels such that you cannot hold onto the railing 0%  16. Walk outside on icy sidewalks 0%  Total: #/16 58.125%                                                                                                                                 TREATMENT DATE: 04/25/24    TA- To improve functional movements patterns for everyday tasks    Fwd walking with vertical head turns followed by bwd walking x 4 laps in the hallway  Step up no UE assist 2 x 10 ea LE   STS with band around knees 2 x 10 with GTB, a few failed attempts but much improved with intent and practice   NMR: To facilitate reeducation of movement, balance, posture, coordination, and/or proprioception/kinesthetic sense.   Side step over hurdle to airex and back to floor with intermittent UE assist 2 x 10 ea LE/side  Squat on airex and basketball shot then picks up basketballs after they are dispersed 2 x 10 - near fall on first reps, min A and UE support to prevent fall  Unless otherwise stated, CGA was provided and gait belt donned in order to ensure pt safety   PATIENT EDUCATION: Education details:Pt educated throughout session about proper posture and technique with exercises. Improved exercise technique, movement at target joints, use of target muscles after min to mod verbal, visual, tactile cues. Person educated: Patient Education method: Explanation Education comprehension: verbalized understanding   HOME EXERCISE PROGRAM: Access Code: [email or web address] URL: [email or web address] Date: 03/16/2024 Prepared by: Lonni Gainer  Exercises - Side Stepping with Resistance at Ankles and Counter Support  - 1 x daily - 7 x weekly - 2 sets - 15 reps - Wide tandem with counter support as needed   - 1 x daily - 7 x weekly - 3 sets - 30 sec hold - Standing Balance with Eyes Closed  - 1 x daily - 7 x weekly - 3 sets - 30 sec hold - Sit  to Stand  - 1 x daily - 7 x weekly - 2 sets - 8  reps   GOALS: Goals reviewed with patient? Yes  SHORT TERM GOALS: Target date: 05/10/2024   Patient will be independent in home exercise program to improve strength/mobility for better functional independence with ADLs. Baseline: HEP provided 10/08 Goal status: Ongoing   LONG TERM GOALS: Target date: 06/06/2024   1.  Patient will complete five times sit to stand test in < 15 seconds indicating an increased LE strength and improved balance. Baseline: 21.12 sec no UE; 11/11 25.76 Goal status: ONGOING  2.  Patient will improve ABC score to 70%   to demonstrate statistically significant improvement in mobility and quality of life as it relates to their balance confidence.  Baseline: 58.125%, 11/11 71.8% Goal status: MET   3.  Patient will increase Berg Balance score by > 6 points to demonstrate decreased fall risk during functional activities. Baseline: 39/ 54 11/11: 51/54 Goal status: MET   4.   Patient will increase 10 meter walk test to >1.42m/s as to improve gait speed for better community ambulation and to reduce fall risk. Baseline: .58 m/s, 11/11: 1.1 m/s Goal status: MET  5.   Patient will maintain stance in condition 4 of MCTSIB test for 30 sec on average of 3 trials in order to indicate improved balance in conditions of low of minimal light and compliant surface.  Baseline: 15 sec average of 3 trials, 11/11 30 sec 3 trials Goal status: MET  6.  Patient will improve DGI score by 4 points or more to reduce fall risk and demonstrate improved dynamic balance. Baseline: 11/11: 15/ 24 Goal status: INITIAL     ASSESSMENT:  CLINICAL IMPRESSION:  Pt presents with good motivation for completion of physical therapy activities. This session focused on dynamic balance and gait. Pt completed fwd walking with vertical head turns. Pt shows some improvement with her STS capabilities with improved hip alignment and decreased valgus knee moment. Pt progressing with step up but still  progressing towards step over pattern. Pt will continue to benefit from skilled physical therapy intervention to address impairments, improved QOL, and attain therapy goals.   OBJECTIVE IMPAIRMENTS: Abnormal gait, decreased activity tolerance, decreased balance, decreased mobility, difficulty walking, and decreased strength.   ACTIVITY LIMITATIONS: standing, squatting, stairs, transfers, and locomotion level  PARTICIPATION LIMITATIONS: community activity, occupation, and yard work  PERSONAL FACTORS: Age, Time since onset of injury/illness/exacerbation, and 3+ comorbidities: DM, Anxiety, DOE, GERD with Jackhammer esophagus are also affecting patient's functional outcome.   REHAB POTENTIAL: Good  CLINICAL DECISION MAKING: Evolving/moderate complexity  EVALUATION COMPLEXITY: Moderate  PLAN:  PT FREQUENCY: 2x/week  PT DURATION: 12 weeks  PLANNED INTERVENTIONS: 97750- Physical Performance Testing, 97110-Therapeutic exercises, 97530- Therapeutic activity, 97112- Neuromuscular re-education, 97535- Self Care, [id number]- Manual therapy, [phone]- Gait training, [phone]- Canalith repositioning, Patient/Family education, Balance training, Stair training, and Vestibular training  PLAN FOR NEXT SESSION: hip strength and balance- dynamic gait (head turns, directional changes, speed variance) - Rotational balance    Note: Portions of this document were prepared using Dragon voice recognition software and although reviewed may contain unintentional dictation errors in syntax, grammar, or spelling.  Lonni KATHEE Gainer PT ,DPT Physical Therapist- [place]  [facility]

## 2024-04-28 ENCOUNTER — Ambulatory Visit: Admitting: Physical Therapy

## 2024-04-28 DIAGNOSIS — R269 Unspecified abnormalities of gait and mobility: Secondary | ICD-10-CM | POA: Diagnosis not present

## 2024-04-28 DIAGNOSIS — R262 Difficulty in walking, not elsewhere classified: Secondary | ICD-10-CM

## 2024-04-28 DIAGNOSIS — R2689 Other abnormalities of gait and mobility: Secondary | ICD-10-CM

## 2024-04-28 DIAGNOSIS — M6281 Muscle weakness (generalized): Secondary | ICD-10-CM

## 2024-04-28 DIAGNOSIS — R2681 Unsteadiness on feet: Secondary | ICD-10-CM

## 2024-04-28 NOTE — Therapy (Signed)
 OUTPATIENT PHYSICAL THERAPY NEURO TREATMENT   Patient Name: Megan Bradley MRN: [id number] DOB:08-11-1940, 83 y.o., female Today's Date: 04/28/2024   PCP: Marylynn Verneita CROME, MD  REFERRING PROVIDER: Marylynn Verneita CROME, MD  END OF SESSION:  PT End of Session - 04/28/24 1107     Visit Number 13    Number of Visits 24    Date for Recertification  06/06/24    Progress Note Due on Visit 20    PT Start Time 1105    PT Stop Time 1144    PT Time Calculation (min) 39 min    Equipment Utilized During Treatment Gait belt    Activity Tolerance Patient tolerated treatment well;No increased pain;Treatment limited secondary to medical complications (Comment)   Upper respiratory congestion   Behavior During Therapy [facility] for tasks assessed/performed           Past Medical History:  Diagnosis Date   Abnormal cervical Papanicolaou smear 01/05/2020   Overview:   a.  Pap smears q 4 to 6 months.       b.  LEEP procedure 04-2009.      Allergy COEDINE; OXY DRUGS   Anemia RECENTLY   Anxiety YES, QUITE DIFFICULT AT TIMES   Anxiety disorder    Aortic atherosclerosis    Arthritis SOME; JUST HAD KNEE REPLACEMENT   both knees   Breast disorder in female 10/14/2019   Cervical dysplasia    a. 2010 - high grade squamous intraepithelial lesion s/p excision.   Chest pain    a. 2006 or 2007 [facility]): reportedly nl cors;  b. 09/2011 ETT: nl.   Depression SOME PROBABLY   Diabetes mellitus without complication (HCC) CONTROLLED WITHOUT RX   DOE (dyspnea on exertion)    Family history of adverse reaction to anesthesia    grand daughter was hard to wake up after back surgery   GERD (gastroesophageal reflux disease) YES AND A JACKHAMMER ESOPHEGUS   Grade II diastolic dysfunction    Herpes zoster    Hyperlipidemia    Hypothyroidism    Mild aortic regurgitation    Nutcracker esophagus    Orthostatic hypotension    Pneumonia    Pre-diabetes    Ruptured left breast implant, initial encounter 07/05/2018    Skin cancer AT LEAST 15 YEARS AGO, AND CONTINUING,   Sleep apnea    does not use cpap, did not tolerate   Vertigo    Wears dentures    partial lower   Past Surgical History:  Procedure Laterality Date   AUGMENTATION MAMMAPLASTY Bilateral    BREAST EXCISIONAL BIOPSY Left    CARDIAC CATHETERIZATION  2003   Dr.Callwood, R/L heart cath,   COLONOSCOPY WITH PROPOFOL  N/A 06/12/2017   Procedure: COLONOSCOPY WITH PROPOFOL ;  Surgeon: Jinny Carmine, MD;  Location: [facility];  Service: Endoscopy;  Laterality: N/A;   dental work     ESOPHAGEAL MANOMETRY N/A 06/19/2016   Procedure: ESOPHAGEAL MANOMETRY (EM);  Surgeon: Carmine Jinny, MD;  Location: [facility];  Service: Endoscopy;  Laterality: N/A;   ESOPHAGOGASTRODUODENOSCOPY (EGD) WITH PROPOFOL  N/A 04/03/2017   Procedure: ESOPHAGOGASTRODUODENOSCOPY (EGD) WITH PROPOFOL ;  Surgeon: Jinny Carmine, MD;  Location: [facility];  Service: Endoscopy;  Laterality: N/A;  diabetic - diet controlled   EYE SURGERY  CATARACTS REMOVED - 2   THYROIDECTOMY  05/15/2015   [facility]   TONSILLECTOMY     TOTAL KNEE ARTHROPLASTY Right 10/25/2020   Procedure: RIGHT TOTAL KNEE ARTHROPLASTY;  Surgeon: Marchia Drivers, MD;  Location: [facility];  Service: Orthopedics;  Laterality: Right;   Patient Active Problem List   Diagnosis Date Noted   Insomnia due to mental condition 02/12/2024   Schatzki's ring of distal esophagus 08/11/2023   Travel advice encounter 05/12/2023   Lumbar radiculopathy 07/29/2022   CKD stage 3 secondary to diabetes (HCC) 05/17/2022   Zenkers diverticulum 05/05/2022   Presbyesophagus 01/30/2022   Prurigo nodularis 07/30/2021   Abnormal breast finding 07/30/2021   MGUS (monoclonal gammopathy of unknown significance) 01/22/2021   Anemia 01/09/2021   S/P TKR (total knee replacement) using cement, right 10/25/2020   Preoperative clearance 09/18/2020   Aortic atherosclerosis 07/10/2020   Exertional dyspnea 05/20/2020   Balance  problem 05/12/2020   History of recent fall 05/12/2020   Type 2 DM with diabetic neuropathy affecting both sides of body (HCC) 01/05/2020   Bilateral chronic knee pain 04/11/2018   S/P breast implant, silicone 03/11/2018   Osteoporosis of femur without pathological fracture 03/11/2018   Nutcracker esophagus 01/01/2018   Tubular adenoma of colon    Squamous cell skin cancer 07/21/2016   Dysphagia 04/08/2016   RBBB    Chest pain    OSA (obstructive sleep apnea) 11/22/2013   Hypovitaminosis D 01/11/2013   History of pernicious anemia 01/11/2013   Hyperlipidemia associated with type 2 diabetes mellitus (HCC) 09/04/2011   Multiple thyroid  nodules 08/27/2011   Cervical dysplasia 08/27/2011   Screening for breast cancer 03/13/2011   Iatrogenic hypothyroidism    GERD (gastroesophageal reflux disease)    Anxiety with obsessional features    Basal cell carcinoma of face 10/03/2010    ONSET DATE: 02/09/24  REFERRING DIAG: R26.89 (ICD-10-CM) - Balance problem   THERAPY DIAG:  Abnormality of gait and mobility  Difficulty in walking, not elsewhere classified  Muscle weakness (generalized)  Unsteadiness on feet  Other abnormalities of gait and mobility  Rationale for Evaluation and Treatment: Rehabilitation  SUBJECTIVE:                                                                                                                                                                                             SUBJECTIVE STATEMENT:  Pt reports she has been doing well, is unsure how she feels about losing the board spot on her neighborhood [place].   Eval: Pt reports she has had PT previously. Pt has pinched nerve in her spine that she [facility] shots [facility] at [facility]. Pt balance is very challenging in dark environments. Pt has had multiple falls where she has no recall of why she fell. Pt has difficulty walking straight most of the time and frequently feels like she  is going to fall. Pt also has  difficulty turning. Pt reports no dizziness just general imbalance.  Pt accompanied by: self  PERTINENT HISTORY: Knee replacement, low back pain, DM, HLD,   PAIN:  Are you having pain? Has pain in back occasionally, none in seated position at present   PRECAUTIONS: None  RED FLAGS: None   WEIGHT BEARING RESTRICTIONS: No  FALLS: Has patient fallen in last 6 months? Yes. Number of falls 2-3  LIVING ENVIRONMENT: Lives with: lives with their spouse Lives in: House/apartment Stairs: yes but level entry and does not need to go upstairs in her home  Has following equipment at home: Walker - 2 wheeled and does not regularly use AD  PLOF: Independent and Independent with basic ADLs  PATIENT GOALS: Improve balance and reduce fall risk   OBJECTIVE:  Note: Objective measures were completed at Evaluation unless otherwise noted.  COGNITION: Overall cognitive status: Within functional limits for tasks assessed   SENSATION: Not tested  LOWER EXTREMITY ROM:   WNL for tasks assessed  LOWER EXTREMITY MMT:    MMT Right Eval Left Eval  Hip flexion 4 4  Hip extension    Hip abduction 4 4  Hip adduction 4+ 4+  Hip internal rotation    Hip external rotation    Knee flexion 4+ 4+  Knee extension 4+ 4+  Ankle dorsiflexion    Ankle plantarflexion    Ankle inversion    Ankle eversion    (Blank rows = not tested)   TRANSFERS: Sit to stand: Complete Independence  Assistive device utilized: knee valgus bilateral and None     Stand to sit: Complete Independence and knee valgus noted  Assistive device utilized: None     Chair to chair: Complete Independence  Assistive device utilized: None       RAMP:  Not tested  CURB:  Not tested  STAIRS: Not tested GAIT: Findings: Gait Characteristics: decreased arm swing- Right, decreased arm swing- Left, decreased step length- Right, decreased step length- Left, decreased stride length, decreased hip/knee flexion- Right, and decreased  hip/knee flexion- Left, Distance walked: 30 ft, and Comments: 1 LOB with step recovery   FUNCTIONAL TESTS:  MCTSIB: Condition 1: Avg of 3 trials: 30 sec, Condition 2: Avg of 3 trials: 30 sec, Condition 3: Avg of 3 trials: 30 sec, Condition 4: Avg of 3 trials: 15 sec, and Total Score: 105/120 5XSTS: 21.12 sec no UE assist, B knee valgus throughout  10 Meter Walk Test: Patient instructed to walk 10 meters (32.8 ft) as quickly and as safely as possible at their normal speed Results: .58 m/s (17.06 seconds )  Cut off scores:   Household Ambulator  < 0.4 m/s  Limited Community Ambulator  0.4 - 0.8 m/s  [employer]  > 0.8 m/s  Increased fall risk  < 1.36m/s  Crossing a Street  >1.54m/s  MCID 0.05 m/s (small), 0.13 m/s (moderate), 0.06 m/s (significant)  (ANPTA Core Set of Outcome Measures for Adults with Neurologic Conditions, 2018)       PATIENT SURVEYS:   ABC scale: The Activities-Specific Balance Confidence (ABC) Scale 0% 10 20 30  40 50 60 70 80 90 100% No confidence<->completely confident  "How confident are you that you will not lose your balance or become unsteady when you . . .   Date tested   Walk around the house 50%  2. Walk up or down stairs 70%  3. Bend over and pick up a slipper from  in front of a closet floor 60%  4. Reach for a small can off a shelf at eye level 90%  5. Stand on tip toes and reach for something above your head 70%  6. Stand on a chair and reach for something 90%  7. Sweep the floor 90%  8. Walk outside the house to a car parked in the driveway 50%  9. Get into or out of a car 60%  10. Walk across a parking lot to the mall 50%  11. Walk up or down a ramp 90%  12. Walk in a crowded mall where people rapidly walk past you 60%  13. Are bumped into by people as you walk through the mall 50%  14. Step onto or off of an escalator while you are holding onto the railing 50%  15. Step onto or off an escalator while holding onto parcels such that  you cannot hold onto the railing 0%  16. Walk outside on icy sidewalks 0%  Total: #/16 58.125%                                                                                                                                 TREATMENT DATE: 04/28/24     Gait training - activities focussed on specific components of gait cycle and multimodal cueing for completion  Gait to outdoors and outside with 2.5# AW - ambulate across stable and unstable surface outside. Negotiating changing surfaces from grass to sidewalk, across brick with turns and obstacles in pathway without LOB. - cues for focus on environment, prior to cue pt had LOB in grass due to not looking at terrain prior to walking through. X 22 min   TA- To improve functional movements patterns for everyday tasks   Sidestep to and from 6 in step 3 x 10 ea with min UE support for balance   NMR: To facilitate reeducation of movement, balance, posture, coordination, and/or proprioception/kinesthetic sense.  Activity Description: blaze pods staggered in 2 parallel lines with 2.5# AW donned. Sidestepping, forward and retro gait focus with dual task of managing blaze pods and pressing with a particular LE based on light color  Activity Setting:  random Number of Pods:  6 Cycles/Sets:  1 Duration (Time or Hit Count):  1.25 min ea round, rest b/w sets.      PATIENT EDUCATION: Education details:Pt educated throughout session about proper posture and technique with exercises. Improved exercise technique, movement at target joints, use of target muscles after min to mod verbal, visual, tactile cues. Person educated: Patient Education method: Explanation Education comprehension: verbalized understanding   HOME EXERCISE PROGRAM: Access Code: [email or web address] URL: [email or web address] Date: 03/16/2024 Prepared by: Lonni Gainer  Exercises - Side Stepping with Resistance at Ankles and Counter Support  - 1 x daily - 7 x weekly - 2  sets - 15 reps - Wide tandem with counter support as needed   - 1 x daily -  7 x weekly - 3 sets - 30 sec hold - Standing Balance with Eyes Closed  - 1 x daily - 7 x weekly - 3 sets - 30 sec hold - Sit to Stand  - 1 x daily - 7 x weekly - 2 sets - 8 reps   GOALS: Goals reviewed with patient? Yes  SHORT TERM GOALS: Target date: 05/10/2024   Patient will be independent in home exercise program to improve strength/mobility for better functional independence with ADLs. Baseline: HEP provided 10/08 Goal status: Ongoing   LONG TERM GOALS: Target date: 06/06/2024   1.  Patient will complete five times sit to stand test in < 15 seconds indicating an increased LE strength and improved balance. Baseline: 21.12 sec no UE; 11/11 25.76 Goal status: ONGOING  2.  Patient will improve ABC score to 70%   to demonstrate statistically significant improvement in mobility and quality of life as it relates to their balance confidence.  Baseline: 58.125%, 11/11 71.8% Goal status: MET   3.  Patient will increase Berg Balance score by > 6 points to demonstrate decreased fall risk during functional activities. Baseline: 39/ 54 11/11: 51/54 Goal status: MET   4.   Patient will increase 10 meter walk test to >1.49m/s as to improve gait speed for better community ambulation and to reduce fall risk. Baseline: .58 m/s, 11/11: 1.1 m/s Goal status: MET  5.   Patient will maintain stance in condition 4 of MCTSIB test for 30 sec on average of 3 trials in order to indicate improved balance in conditions of low of minimal light and compliant surface.  Baseline: 15 sec average of 3 trials, 11/11 30 sec 3 trials Goal status: MET  6.  Patient will improve DGI score by 4 points or more to reduce fall risk and demonstrate improved dynamic balance. Baseline: 11/11: 15/ 24 Goal status: INITIAL     ASSESSMENT:  CLINICAL IMPRESSION:  Pt presents with good motivation for completion of physical therapy activities.  This session focused on dynamic balance and gait in outdoor environment. Pt initially had LOB in grass requiring min A for recovery but with cues and practice demonstrated improvement. The patient demonstrated significant progress while utilizing [employer], showcasing improved coordination, balance, and cognitive function. The incorporation of dual-tasking technology with color recognition and association with specific movements in Blaze Pods was strategically chosen to provide a dynamic training environment, enabling the patient to engage in simultaneous physical and cognitive tasks. This unique approach enhances not only their physical abilities but also fosters increased neural connectivity and mental awareness, contributing to a well-rounded and effective rehabilitation and training experience. Pt will continue to benefit from skilled physical therapy intervention to address impairments, improved QOL, and attain therapy goals.   OBJECTIVE IMPAIRMENTS: Abnormal gait, decreased activity tolerance, decreased balance, decreased mobility, difficulty walking, and decreased strength.   ACTIVITY LIMITATIONS: standing, squatting, stairs, transfers, and locomotion level  PARTICIPATION LIMITATIONS: community activity, occupation, and yard work  PERSONAL FACTORS: Age, Time since onset of injury/illness/exacerbation, and 3+ comorbidities: DM, Anxiety, DOE, GERD with Jackhammer esophagus are also affecting patient's functional outcome.   REHAB POTENTIAL: Good  CLINICAL DECISION MAKING: Evolving/moderate complexity  EVALUATION COMPLEXITY: Moderate  PLAN:  PT FREQUENCY: 2x/week  PT DURATION: 12 weeks  PLANNED INTERVENTIONS: 97750- Physical Performance Testing, 97110-Therapeutic exercises, 97530- Therapeutic activity, [id number]- Neuromuscular re-education, 97535- Self Care, [id number]- Manual therapy, [phone]- Gait training, [phone]- Canalith repositioning, Patient/Family education, Balance training, Stair training,  and Vestibular training  PLAN  FOR NEXT SESSION: hip strength and balance- dynamic gait (head turns, directional changes, speed variance) - Rotational balance    Note: Portions of this document were prepared using Dragon voice recognition software and although reviewed may contain unintentional dictation errors in syntax, grammar, or spelling.  Lonni KATHEE Gainer PT ,DPT Physical Therapist-   [facility]

## 2024-05-02 ENCOUNTER — Ambulatory Visit: Admitting: Physical Therapy

## 2024-05-02 DIAGNOSIS — M6281 Muscle weakness (generalized): Secondary | ICD-10-CM

## 2024-05-02 DIAGNOSIS — R2681 Unsteadiness on feet: Secondary | ICD-10-CM

## 2024-05-02 DIAGNOSIS — R2689 Other abnormalities of gait and mobility: Secondary | ICD-10-CM

## 2024-05-02 DIAGNOSIS — R269 Unspecified abnormalities of gait and mobility: Secondary | ICD-10-CM

## 2024-05-02 DIAGNOSIS — R262 Difficulty in walking, not elsewhere classified: Secondary | ICD-10-CM

## 2024-05-02 NOTE — Therapy (Unsigned)
 OUTPATIENT PHYSICAL THERAPY NEURO TREATMENT   Patient Name: Megan Bradley MRN: [id number] DOB:1941-01-17, 83 y.o., female Today's Date: 05/03/2024   PCP: Marylynn Verneita CROME, MD  REFERRING PROVIDER: Marylynn Verneita CROME, MD  END OF SESSION:  PT End of Session - 05/02/24 1553     Visit Number 14    Number of Visits 24    Date for Recertification  06/06/24    Progress Note Due on Visit 20    PT Start Time 1445    PT Stop Time 1525    PT Time Calculation (min) 40 min    Equipment Utilized During Treatment Gait belt    Activity Tolerance Patient tolerated treatment well   Upper respiratory congestion   Behavior During Therapy [facility] for tasks assessed/performed          Past Medical History:  Diagnosis Date   Abnormal cervical Papanicolaou smear 01/05/2020   Overview:   a.  Pap smears q 4 to 6 months.       b.  LEEP procedure 04-2009.      Allergy COEDINE; OXY DRUGS   Anemia RECENTLY   Anxiety YES, QUITE DIFFICULT AT TIMES   Anxiety disorder    Aortic atherosclerosis    Arthritis SOME; JUST HAD KNEE REPLACEMENT   both knees   Breast disorder in female 10/14/2019   Cervical dysplasia    a. 2010 - high grade squamous intraepithelial lesion s/p excision.   Chest pain    a. 2006 or 2007 Cath [facility]): reportedly nl cors;  b. 09/2011 ETT: nl.   Depression SOME PROBABLY   Diabetes mellitus without complication (HCC) CONTROLLED WITHOUT RX   DOE (dyspnea on exertion)    Family history of adverse reaction to anesthesia    grand daughter was hard to wake up after back surgery   GERD (gastroesophageal reflux disease) YES AND A JACKHAMMER ESOPHEGUS   Grade II diastolic dysfunction    Herpes zoster    Hyperlipidemia    Hypothyroidism    Mild aortic regurgitation    Nutcracker esophagus    Orthostatic hypotension    Pneumonia    Pre-diabetes    Ruptured left breast implant, initial encounter 07/05/2018   Skin cancer AT LEAST 15 YEARS AGO, AND CONTINUING,   Sleep apnea    does not  use cpap, did not tolerate   Vertigo    Wears dentures    partial lower   Past Surgical History:  Procedure Laterality Date   AUGMENTATION MAMMAPLASTY Bilateral    BREAST EXCISIONAL BIOPSY Left    CARDIAC CATHETERIZATION  2003   Dr.Callwood, R/L heart cath,   COLONOSCOPY WITH PROPOFOL  N/A 06/12/2017   Procedure: COLONOSCOPY WITH PROPOFOL ;  Surgeon: Jinny Carmine, MD;  Location: [facility];  Service: Endoscopy;  Laterality: N/A;   dental work     ESOPHAGEAL MANOMETRY N/A 06/19/2016   Procedure: ESOPHAGEAL MANOMETRY (EM);  Surgeon: Carmine Jinny, MD;  Location: [facility];  Service: Endoscopy;  Laterality: N/A;   ESOPHAGOGASTRODUODENOSCOPY (EGD) WITH PROPOFOL  N/A 04/03/2017   Procedure: ESOPHAGOGASTRODUODENOSCOPY (EGD) WITH PROPOFOL ;  Surgeon: Jinny Carmine, MD;  Location: [facility];  Service: Endoscopy;  Laterality: N/A;  diabetic - diet controlled   EYE SURGERY  CATARACTS REMOVED - 2   THYROIDECTOMY  05/15/2015   [facility]   TONSILLECTOMY     TOTAL KNEE ARTHROPLASTY Right 10/25/2020   Procedure: RIGHT TOTAL KNEE ARTHROPLASTY;  Surgeon: Marchia Drivers, MD;  Location: [facility];  Service: Orthopedics;  Laterality: Right;  Patient Active Problem List   Diagnosis Date Noted   Insomnia due to mental condition 02/12/2024   Schatzki's ring of distal esophagus 08/11/2023   Travel advice encounter 05/12/2023   Lumbar radiculopathy 07/29/2022   CKD stage 3 secondary to diabetes (HCC) 05/17/2022   Zenkers diverticulum 05/05/2022   Presbyesophagus 01/30/2022   Prurigo nodularis 07/30/2021   Abnormal breast finding 07/30/2021   MGUS (monoclonal gammopathy of unknown significance) 01/22/2021   Anemia 01/09/2021   S/P TKR (total knee replacement) using cement, right 10/25/2020   Preoperative clearance 09/18/2020   Aortic atherosclerosis 07/10/2020   Exertional dyspnea 05/20/2020   Balance problem 05/12/2020   History of recent fall 05/12/2020   Type 2 DM with diabetic  neuropathy affecting both sides of body (HCC) 01/05/2020   Bilateral chronic knee pain 04/11/2018   S/P breast implant, silicone 03/11/2018   Osteoporosis of femur without pathological fracture 03/11/2018   Nutcracker esophagus 01/01/2018   Tubular adenoma of colon    Squamous cell skin cancer 07/21/2016   Dysphagia 04/08/2016   RBBB    Chest pain    OSA (obstructive sleep apnea) 11/22/2013   Hypovitaminosis D 01/11/2013   History of pernicious anemia 01/11/2013   Hyperlipidemia associated with type 2 diabetes mellitus (HCC) 09/04/2011   Multiple thyroid  nodules 08/27/2011   Cervical dysplasia 08/27/2011   Screening for breast cancer 03/13/2011   Iatrogenic hypothyroidism    GERD (gastroesophageal reflux disease)    Anxiety with obsessional features    Basal cell carcinoma of face 10/03/2010    ONSET DATE: 02/09/24  REFERRING DIAG: R26.89 (ICD-10-CM) - Balance problem   THERAPY DIAG:  Abnormality of gait and mobility  Difficulty in walking, not elsewhere classified  Muscle weakness (generalized)  Unsteadiness on feet  Other abnormalities of gait and mobility  Rationale for Evaluation and Treatment: Rehabilitation  SUBJECTIVE:                                                                                                                                                                                             SUBJECTIVE STATEMENT:  Pt states she is doing well. She went to lunch prior to appt and was unable to change shoes.   Eval: Pt reports she has had PT previously. Pt has pinched nerve in her spine that she gets shots for at [facility]. Pt balance is very challenging in dark environments. Pt has had multiple falls where she has no recall of why she fell. Pt has difficulty walking straight most of the time and frequently feels like she is going to fall. Pt also has difficulty turning. Pt reports  no dizziness just general imbalance.  Pt accompanied by:  self  PERTINENT HISTORY: Knee replacement, low back pain, DM, HLD,   PAIN:  Are you having pain? Has pain in back occasionally, none in seated position at present   PRECAUTIONS: None  RED FLAGS: None   WEIGHT BEARING RESTRICTIONS: No  FALLS: Has patient fallen in last 6 months? Yes. Number of falls 2-3  LIVING ENVIRONMENT: Lives with: lives with their spouse Lives in: House/apartment Stairs: yes but level entry and does not need to go upstairs in her home  Has following equipment at home: Walker - 2 wheeled and does not regularly use AD  PLOF: Independent and Independent with basic ADLs  PATIENT GOALS: Improve balance and reduce fall risk   OBJECTIVE:  Note: Objective measures were completed at Evaluation unless otherwise noted.  COGNITION: Overall cognitive status: Within functional limits for tasks assessed   SENSATION: Not tested  LOWER EXTREMITY ROM:   WNL for tasks assessed  LOWER EXTREMITY MMT:    MMT Right Eval Left Eval  Hip flexion 4 4  Hip extension    Hip abduction 4 4  Hip adduction 4+ 4+  Hip internal rotation    Hip external rotation    Knee flexion 4+ 4+  Knee extension 4+ 4+  Ankle dorsiflexion    Ankle plantarflexion    Ankle inversion    Ankle eversion    (Blank rows = not tested)   TRANSFERS: Sit to stand: Complete Independence  Assistive device utilized: knee valgus bilateral and None     Stand to sit: Complete Independence and knee valgus noted  Assistive device utilized: None     Chair to chair: Complete Independence  Assistive device utilized: None       RAMP:  Not tested  CURB:  Not tested  STAIRS: Not tested GAIT: Findings: Gait Characteristics: decreased arm swing- Right, decreased arm swing- Left, decreased step length- Right, decreased step length- Left, decreased stride length, decreased hip/knee flexion- Right, and decreased hip/knee flexion- Left, Distance walked: 30 ft, and Comments: 1 LOB with step recovery    FUNCTIONAL TESTS:  MCTSIB: Condition 1: Avg of 3 trials: 30 sec, Condition 2: Avg of 3 trials: 30 sec, Condition 3: Avg of 3 trials: 30 sec, Condition 4: Avg of 3 trials: 15 sec, and Total Score: 105/120 5XSTS: 21.12 sec no UE assist, B knee valgus throughout  10 Meter Walk Test: Patient instructed to walk 10 meters (32.8 ft) as quickly and as safely as possible at their normal speed Results: .58 m/s (17.06 seconds )  Cut off scores:   Household Ambulator  < 0.4 m/s  Limited Community Ambulator  0.4 - 0.8 m/s  [employer]  > 0.8 m/s  Increased fall risk  < 1.69m/s  Crossing a Street  >1.92m/s  MCID 0.05 m/s (small), 0.13 m/s (moderate), 0.06 m/s (significant)  (ANPTA Core Set of Outcome Measures for Adults with Neurologic Conditions, 2018)    PATIENT SURVEYS:   ABC scale: The Activities-Specific Balance Confidence (ABC) Scale 0% 10 20 30  40 50 60 70 80 90 100% No confidence<->completely confident  "How confident are you that you will not lose your balance or become unsteady when you . . .   Date tested   Walk around the house 50%  2. Walk up or down stairs 70%  3. Bend over and pick up a slipper from in front of a closet floor 60%  4. Reach for a small can  off a shelf at eye level 90%  5. Stand on tip toes and reach for something above your head 70%  6. Stand on a chair and reach for something 90%  7. Sweep the floor 90%  8. Walk outside the house to a car parked in the driveway 50%  9. Get into or out of a car 60%  10. Walk across a parking lot to the mall 50%  11. Walk up or down a ramp 90%  12. Walk in a crowded mall where people rapidly walk past you 60%  13. Are bumped into by people as you walk through the mall 50%  14. Step onto or off of an escalator while you are holding onto the railing 50%  15. Step onto or off an escalator while holding onto parcels such that you cannot hold onto the railing 0%  16. Walk outside on icy sidewalks 0%  Total: #/16  58.125%                                                                                                                                 TREATMENT DATE: 05/03/24   TA- To improve functional movements patterns for everyday tasks    Gait with 2.5# AW 450 ft, dual task naming animals in alphabetical  Sit to stand GTB around distal thigh holding 3kg ball 2 x 10 -GTB providing external cue to prevent knees from touching  Gait with 2.5# AW 450 ft, naming movie titles   Activity Description: Blaze pods arranged in a large square with toe taps to blaze pods. Fwd walking and turning to get to next blaze pod Activity Setting:  Random Number of Pods:  4 Cycles/Sets:  2 sets Duration (Time or Hit Count):  1:30 min  NMR: To facilitate reeducation of movement, balance, posture, coordination, and/or proprioception/kinesthetic sense.   Activity Description: Pt holding physioball overhead against wall, with toe taps to blaze pod encouraging single leg stance with toe touchdown as needed Activity Setting:  Random Number of Pods:  4 Cycles/Sets:  1x with ea LE Duration (Time or Hit Count):  1:30 min   Lateral facing on airex semi-tandem stance throwing and catching small ball x20 ea direction   PATIENT EDUCATION: Education details:Pt educated throughout session about proper posture and technique with exercises. Improved exercise technique, movement at target joints, use of target muscles after min to mod verbal, visual, tactile cues. Person educated: Patient Education method: Explanation Education comprehension: verbalized understanding   HOME EXERCISE PROGRAM: Access Code: [email or web address] URL: [email or web address] Date: 03/16/2024 Prepared by: Lonni Gainer  Exercises - Side Stepping with Resistance at Ankles and Counter Support  - 1 x daily - 7 x weekly - 2 sets - 15 reps - Wide tandem with counter support as needed   - 1 x daily - 7 x weekly - 3 sets - 30 sec hold - Standing  Balance with Eyes Closed  - 1 x  daily - 7 x weekly - 3 sets - 30 sec hold - Sit to Stand  - 1 x daily - 7 x weekly - 2 sets - 8 reps   GOALS: Goals reviewed with patient? Yes  SHORT TERM GOALS: Target date: 05/10/2024   Patient will be independent in home exercise program to improve strength/mobility for better functional independence with ADLs. Baseline: HEP provided 10/08 Goal status: Ongoing   LONG TERM GOALS: Target date: 06/06/2024   1.  Patient will complete five times sit to stand test in < 15 seconds indicating an increased LE strength and improved balance. Baseline: 21.12 sec no UE; 11/11 25.76 Goal status: ONGOING  2.  Patient will improve ABC score to 70%   to demonstrate statistically significant improvement in mobility and quality of life as it relates to their balance confidence.  Baseline: 58.125%, 11/11 71.8% Goal status: MET   3.  Patient will increase Berg Balance score by > 6 points to demonstrate decreased fall risk during functional activities. Baseline: 39/ 54 11/11: 51/54 Goal status: MET   4.   Patient will increase 10 meter walk test to >1.27m/s as to improve gait speed for better community ambulation and to reduce fall risk. Baseline: .58 m/s, 11/11: 1.1 m/s Goal status: MET  5.   Patient will maintain stance in condition 4 of MCTSIB test for 30 sec on average of 3 trials in order to indicate improved balance in conditions of low of minimal light and compliant surface.  Baseline: 15 sec average of 3 trials, 11/11 30 sec 3 trials Goal status: MET  6.  Patient will improve DGI score by 4 points or more to reduce fall risk and demonstrate improved dynamic balance. Baseline: 11/11: 15/ 24 Goal status: INITIAL     ASSESSMENT:  CLINICAL IMPRESSION:  Pt presents with good motivation for completion of physical therapy activities. Last session pt demonstrated difficulty with dual task during gait. This session worked on gait with naming animals and  movies with patient demonstrating decreased gait speed. Pt also challenged by obstacles in the hallway. Pt did well with directional changes and was able to turn and walk to next blaze pod with no LOB. Single leg stance was progressed this session to include maintaining physioball overhead and tapping blazepods. Pt required occasional toe touch down, but overall maintained balance well. Rotational throws also proved a challenge with hip strategies utilized to maintain balance. While challenging pt recovered well on own. Pt will continue to benefit from skilled physical therapy intervention to address impairments, improved QOL, and attain therapy goals.   OBJECTIVE IMPAIRMENTS: Abnormal gait, decreased activity tolerance, decreased balance, decreased mobility, difficulty walking, and decreased strength.   ACTIVITY LIMITATIONS: standing, squatting, stairs, transfers, and locomotion level  PARTICIPATION LIMITATIONS: community activity, occupation, and yard work  PERSONAL FACTORS: Age, Time since onset of injury/illness/exacerbation, and 3+ comorbidities: DM, Anxiety, DOE, GERD with Jackhammer esophagus are also affecting patient's functional outcome.   REHAB POTENTIAL: Good  CLINICAL DECISION MAKING: Evolving/moderate complexity  EVALUATION COMPLEXITY: Moderate  PLAN:  PT FREQUENCY: 2x/week  PT DURATION: 12 weeks  PLANNED INTERVENTIONS: 97750- Physical Performance Testing, 97110-Therapeutic exercises, 97530- Therapeutic activity, [id number]- Neuromuscular re-education, 97535- Self Care, [id number]- Manual therapy, [phone]- Gait training, [phone]- Canalith repositioning, Patient/Family education, Balance training, Stair training, and Vestibular training  PLAN FOR NEXT SESSION: hip strength and balance- dynamic gait (head turns, directional changes, speed variance) - Rotational balance    Note: Portions of this document were prepared using Dragon  voice recognition software and although reviewed may contain  unintentional dictation errors in syntax, grammar, or spelling.  Leonor Rode, SPT

## 2024-05-03 ENCOUNTER — Encounter: Payer: Self-pay | Admitting: Cardiovascular Disease

## 2024-05-03 ENCOUNTER — Ambulatory Visit: Attending: Cardiovascular Disease | Admitting: Cardiovascular Disease

## 2024-05-03 VITALS — BP 120/60 | HR 68 | Ht 65.5 in | Wt 142.0 lb

## 2024-05-03 DIAGNOSIS — I5032 Chronic diastolic (congestive) heart failure: Secondary | ICD-10-CM

## 2024-05-03 DIAGNOSIS — R55 Syncope and collapse: Secondary | ICD-10-CM

## 2024-05-03 DIAGNOSIS — I10 Essential (primary) hypertension: Secondary | ICD-10-CM

## 2024-05-03 DIAGNOSIS — N183 Chronic kidney disease, stage 3 unspecified: Secondary | ICD-10-CM

## 2024-05-03 DIAGNOSIS — R600 Localized edema: Secondary | ICD-10-CM

## 2024-05-03 DIAGNOSIS — E1122 Type 2 diabetes mellitus with diabetic chronic kidney disease: Secondary | ICD-10-CM

## 2024-05-03 DIAGNOSIS — I700 Atherosclerosis of aorta: Secondary | ICD-10-CM

## 2024-05-03 DIAGNOSIS — E782 Mixed hyperlipidemia: Secondary | ICD-10-CM | POA: Diagnosis not present

## 2024-05-03 DIAGNOSIS — R0609 Other forms of dyspnea: Secondary | ICD-10-CM

## 2024-05-03 NOTE — Patient Instructions (Signed)

## 2024-05-03 NOTE — Progress Notes (Signed)
 Cardiology Office Note  Date:  05/03/2024   ID:  Megan Bradley, Megan Bradley Nov 30, 1940, MRN [id number]  PCP:  Megan Verneita CROME, MD   Chief Complaint  Patient presents with   12 month follow up     Doing well.     HPI:  Megan Bradley is a 83 year old woman past medical history of syncope , symptoms improved by holding  lisinopril and HCTZ.  chronic chest pain felt secondary to spasm,  previous cardiac catheterization with no coronary disease into 2007,   Megan Bradley, spasm  she presents for routine followup of her chest pain, shortness of breath, syncope  Last seen in clinic October 2023  On her visit today, vitals with diverticulum in her Bradley Food sticking, periodic regurgitation Scheduled for procedure  Jun 30 2024 in effort to fix the diverticulum  Takes imdur  daily Rare episodes of nutcracker Bradley Has script for levsin   Doing physical therapy for balance Denies shortness of breath or chest pain No near-syncope or syncope Reports blood pressure low but stable  EKG personally reviewed by myself on todays visit EKG Interpretation Date/Time:  Tuesday May 03 2024 16:15:45 EST Ventricular Rate:  68 PR Interval:  180 QRS Duration:  120 QT Interval:  432 QTC Calculation: 459 R Axis:   -30  Text Interpretation: Normal sinus rhythm Left axis deviation Right bundle branch block Possible Lateral infarct , age undetermined When compared with ECG of 25-Jun-2022 08:12, No significant change was found Confirmed by Megan Bradley [phone]) on 05/03/2024 4:38:39 PM   Other past medical history reviewed In emergency room June 25, 2022 for syncope Day before presentation did not have much to drink apart from cups of coffee Next morning went to the bathroom, went back to bed felt lightheaded flushed and by report had syncope witnessed by husband Brief loss of consciousness several seconds to less than a minute Blood pressure low in the emergency room 99/78 TSH  25 Treated with IV fluids  Echo 4/22: normal EF 60%  Knee surgery 10/2020 10/25/2020 and underwent an uncomplicated right total knee arthroplasty.  Had delerium HGB 8.7, Has recovered: 11.3   chest pain 2013, normal CT scan chest, normal exercise treadmill test, Normal LV function by echocardiogram   Previous echocardiogram many years ago showed diastolic dysfunction with normal LV function    PMH:   has a past medical history of Abnormal cervical Papanicolaou smear (01/05/2020), Allergy (COEDINE; OXY DRUGS), Anemia (RECENTLY), Anxiety (YES, QUITE DIFFICULT AT TIMES), Anxiety disorder, Aortic atherosclerosis, Arthritis (SOME; JUST HAD KNEE REPLACEMENT), Breast disorder in female (10/14/2019), Cervical dysplasia, Chest pain, Depression (SOME PROBABLY), Diabetes mellitus without complication (HCC) (CONTROLLED WITHOUT RX), DOE (dyspnea on exertion), Family history of adverse reaction to anesthesia, GERD (gastroesophageal reflux disease) (YES AND A Megan ESOPHEGUS), Grade II diastolic dysfunction, Herpes zoster, Hyperlipidemia, Hypothyroidism, Mild aortic regurgitation, Nutcracker Bradley, Orthostatic hypotension, Pneumonia, Pre-diabetes, Ruptured left breast implant, initial encounter (07/05/2018), Skin cancer (AT LEAST 15 YEARS AGO, AND CONTINUING,), Sleep apnea, Vertigo, and Wears dentures.  PSH:    Past Surgical History:  Procedure Laterality Date   AUGMENTATION MAMMAPLASTY Bilateral    BREAST EXCISIONAL BIOPSY Left    CARDIAC CATHETERIZATION  2003   Dr.Callwood, R/L heart cath,   COLONOSCOPY WITH PROPOFOL  N/A 06/12/2017   Procedure: COLONOSCOPY WITH PROPOFOL ;  Surgeon: Jinny Carmine, MD;  Location: [facility];  Service: Endoscopy;  Laterality: N/A;   dental work     ESOPHAGEAL MANOMETRY N/A 06/19/2016   Procedure: ESOPHAGEAL MANOMETRY (EM);  Surgeon: Rogelia Copping, MD;  [facility];  Service: Endoscopy;  Laterality: N/A;   ESOPHAGOGASTRODUODENOSCOPY (EGD) WITH  PROPOFOL  N/A 04/03/2017   Procedure: ESOPHAGOGASTRODUODENOSCOPY (EGD) WITH PROPOFOL ;  Surgeon: Copping Rogelia, MD;  Location: [facility];  Service: Endoscopy;  Laterality: N/A;  diabetic - diet controlled   EYE SURGERY  CATARACTS REMOVED - 2   THYROIDECTOMY  05/15/2015   [facility]   TONSILLECTOMY     TOTAL KNEE ARTHROPLASTY Right 10/25/2020   Procedure: RIGHT TOTAL KNEE ARTHROPLASTY;  Surgeon: Marchia Drivers, MD;  Location: [facility];  Service: Orthopedics;  Laterality: Right;    Current Outpatient Medications  Medication Sig Dispense Refill   ACCU-CHEK GUIDE TEST test strip USE TO CHECK BLOOD SUGAR ONCE  DAILY 100 strip 2   ALPRAZolam  (XANAX ) 0.5 MG tablet Take 1 tablet by mouth twice daily as needed for anxiety 60 tablet 5   busPIRone  (BUSPAR ) 10 MG tablet Take 1 tablet (10 mg total) by mouth 3 (three) times daily. 90 tablet 2   clobetasol ointment (TEMOVATE) 0.05 % SMARTSIG:sparingly Topical Twice Daily PRN     clotrimazole-betamethasone  (LOTRISONE) cream SMARTSIG:0.5 Gram(s) Topical Daily     DUPIXENT 300 MG/2ML SOPN Every other Friday     ergocalciferol  (DRISDOL ) 1.25 MG (50000 UT) capsule Take 1 capsule (50,000 Units total) by mouth once a week. 12 capsule 1   ezetimibe  (ZETIA ) 10 MG tablet Take 1 tablet (10 mg total) by mouth daily. 90 tablet 3   hyoscyamine  (ANASPAZ ) 0.125 MG TBDP disintergrating tablet PLACE ONE TABLET UNDER THE TONGUE EVERY SIX HOURS AS NEEDED 30 tablet 0   isosorbide  mononitrate (IMDUR ) 30 MG 24 hr tablet TAKE 1 TABLET BY MOUTH DAILY 100 tablet 2   levothyroxine  (SYNTHROID ) 125 MCG tablet Take 1 tablet (125 mcg total) by mouth daily before breakfast. 90 tablet 0   meclizine  (ANTIVERT ) 25 MG tablet TAKE 1 TABLET BY MOUTH THREE TIMES DAILY AS NEEDED FOR VERTIGO 30 tablet 0   omeprazole  (PRILOSEC) 40 MG capsule Take 1 capsule (40 mg total) by mouth 2 (two) times daily. 180 capsule 3   raloxifene  (EVISTA ) 60 MG tablet TAKE 1 TABLET BY MOUTH DAILY 100 tablet 3    rosuvastatin  (CRESTOR ) 20 MG tablet TAKE 1 TABLET BY MOUTH EVERY  OTHER DAY 50 tablet 2   sertraline  (ZOLOFT ) 100 MG tablet TAKE 2 TABLETS BY MOUTH DAILY 180 tablet 3   No current facility-administered medications for this visit.   Facility-Administered Medications Ordered in Other Visits  Medication Dose Route Frequency Provider Last Rate Last Admin   clindamycin  (CLEOCIN ) IVPB 600 mg  600 mg Intravenous Once Krasinski, Kevin, MD         Allergies:   Tetracycline, Atorvastatin, Codeine, and Fentanyl    Social History:  The patient  reports that she has never smoked. She has never used smokeless tobacco. She reports current alcohol use. She reports that she does not use drugs.   Family History:   family history includes Alcohol abuse in her mother; Anxiety disorder in her mother; Colon cancer (age of onset: 2) in her paternal grandmother; Heart attack in her father and mother; Heart disease in her father and mother; Hypertension in her father; Kidney disease in her brother and brother; Mental illness in her mother.    Review of Systems: Review of Systems  Constitutional: Negative.   HENT: Negative.    Respiratory: Negative.    Cardiovascular: Negative.   Gastrointestinal: Negative.   Musculoskeletal: Negative.   Neurological: Negative.  Psychiatric/Behavioral: Negative.    All other systems reviewed and are negative.   PHYSICAL EXAM: VS:  BP 120/60 (BP Location: Left Arm, Patient Position: Sitting, Cuff Size: Normal)   Pulse 68   Ht 5' 5.5 (1.664 m)   Wt 142 lb (64.4 kg)   SpO2 98%   BMI 23.27 kg/m  , BMI Body mass index is 23.27 kg/m. Constitutional:  oriented to person, place, and time. No distress.  HENT:  Head: Normocephalic and atraumatic.  Eyes:  no discharge. No scleral icterus.  Neck: Normal range of motion. Neck supple. No JVD present.  Cardiovascular: Normal rate, regular rhythm, normal heart sounds and intact distal pulses. Exam reveals no gallop and no  friction rub. No edema No murmur heard. Pulmonary/Chest: Effort normal and breath sounds normal. No stridor. No respiratory distress.  no wheezes.  no rales.  no tenderness.  Abdominal: Soft.  no distension.  no tenderness.  Musculoskeletal: Normal range of motion.  no  tenderness or deformity.  Neurological:  normal muscle tone. Coordination normal. No atrophy Skin: Skin is warm and dry. No rash noted. not diaphoretic.  Psychiatric:  normal mood and affect. behavior is normal. Thought content normal.   Recent Labs: 11/10/2023: ALT 12; BUN 16; Creatinine, Ser 0.93; Magnesium 2.1; Potassium 4.5; Sodium 140 04/05/2024: TSH 21.34 04/20/2024: Hemoglobin 11.2; Platelet Count 154    Lipid Panel Lab Results  Component Value Date   CHOL 125 11/10/2023   HDL 75.80 11/10/2023   LDLCALC 39 11/10/2023   TRIG 49.0 11/10/2023    Wt Readings from Last 3 Encounters:  05/03/24 142 lb (64.4 kg)  04/20/24 144 lb (65.3 kg)  02/09/24 143 lb 6.4 oz (65 kg)     ASSESSMENT AND PLAN:  Problem List Items Addressed This Visit       Cardiology Problems   Aortic atherosclerosis - Primary   Relevant Orders   EKG 12-Lead (Completed)     Other   CKD stage 3 secondary to diabetes (HCC)   Exertional dyspnea   Relevant Orders   EKG 12-Lead (Completed)   Other Visit Diagnoses       Chronic diastolic heart failure (HCC)       Relevant Orders   EKG 12-Lead (Completed)     Syncope and collapse         Mixed hyperlipidemia         Benign essential HTN       Relevant Orders   EKG 12-Lead (Completed)     Bilateral lower extremity edema          Pure hypercholesterolemia Tolerating Zetia  daily, Crestor  20 every other day Cholesterol at goal  Aortic arch atherosclerosis (HCC) Prior CT scan images no significant coronary calcifications, no calcified plaque noted in the ascending or descending aorta, cholesterol controlled   Atypical chest pain Controlled with Imdur  30 daily, Levsin  as  needed Reports sublingual nitroglycerin  dropped her blood pressure too low   Diabetes mellitus without complication (HCC) Weight stable, A1c trending higher 7.1 up from 6.0  Esophageal spasm Off diltiazem  secondary to leg swelling Tolerating Imdur , Levsin  as needed  Syncope Long history of hypovolemia, orthostasis Echo cardiogram  with normal EF Hypotensive in the emergency room January 2024 Recommend she stay hydrated  Leg edema Symptoms improved by holding calcium  channel blocker No leg swelling on today's visit   Orthostatic hypotension Episodes of syncope, isosorbide  later in the day, recommend she stay hydrated  Hypothyroidism TSH 21 Reminded to take her thyroid  medication  Signed, Velinda Lunger, M.D., Ph.D. [facility] [place], [place] [phone]

## 2024-05-04 ENCOUNTER — Ambulatory Visit: Admitting: Physical Therapy

## 2024-05-04 ENCOUNTER — Ambulatory Visit

## 2024-05-09 ENCOUNTER — Ambulatory Visit
Admission: RE | Admit: 2024-05-09 | Discharge: 2024-05-09 | Disposition: A | Source: Ambulatory Visit | Attending: Internal Medicine | Admitting: Internal Medicine

## 2024-05-09 ENCOUNTER — Ambulatory Visit: Admitting: Physical Therapy

## 2024-05-09 DIAGNOSIS — R262 Difficulty in walking, not elsewhere classified: Secondary | ICD-10-CM | POA: Insufficient documentation

## 2024-05-09 DIAGNOSIS — R2681 Unsteadiness on feet: Secondary | ICD-10-CM | POA: Diagnosis present

## 2024-05-09 DIAGNOSIS — R2689 Other abnormalities of gait and mobility: Secondary | ICD-10-CM | POA: Insufficient documentation

## 2024-05-09 DIAGNOSIS — R269 Unspecified abnormalities of gait and mobility: Secondary | ICD-10-CM | POA: Insufficient documentation

## 2024-05-09 DIAGNOSIS — M6281 Muscle weakness (generalized): Secondary | ICD-10-CM | POA: Diagnosis present

## 2024-05-09 DIAGNOSIS — Z780 Asymptomatic menopausal state: Secondary | ICD-10-CM | POA: Insufficient documentation

## 2024-05-09 NOTE — Therapy (Signed)
 OUTPATIENT PHYSICAL THERAPY NEURO TREATMENT   Patient Name: Megan Bradley MRN: [id number] DOB:Jun 02, 1941, 83 y.o., female Today's Date: 05/09/2024   PCP: Marylynn Verneita CROME, MD  REFERRING PROVIDER: Marylynn Verneita CROME, MD  END OF SESSION:  PT End of Session - 05/09/24 1636     Visit Number 15    Number of Visits 24    Date for Recertification  06/06/24    Progress Note Due on Visit 20    PT Start Time 1452    PT Stop Time 1530    PT Time Calculation (min) 38 min    Equipment Utilized During Treatment Gait belt    Activity Tolerance Patient tolerated treatment well   Upper respiratory congestion   Behavior During Therapy [facility] for tasks assessed/performed           Past Medical History:  Diagnosis Date   Abnormal cervical Papanicolaou smear 01/05/2020   Overview:   a.  Pap smears q 4 to 6 months.       b.  LEEP procedure 04-2009.      Allergy COEDINE; OXY DRUGS   Anemia RECENTLY   Anxiety YES, QUITE DIFFICULT AT TIMES   Anxiety disorder    Aortic atherosclerosis    Arthritis SOME; JUST HAD KNEE REPLACEMENT   both knees   Breast disorder in female 10/14/2019   Cervical dysplasia    a. 2010 - high grade squamous intraepithelial lesion s/p excision.   Chest pain    a. 2006 or 2007 [facility]): reportedly nl cors;  b. 09/2011 ETT: nl.   Depression SOME PROBABLY   Diabetes mellitus without complication (HCC) CONTROLLED WITHOUT RX   DOE (dyspnea on exertion)    Family history of adverse reaction to anesthesia    grand daughter was hard to wake up after back surgery   GERD (gastroesophageal reflux disease) YES AND A JACKHAMMER ESOPHEGUS   Grade II diastolic dysfunction    Herpes zoster    Hyperlipidemia    Hypothyroidism    Mild aortic regurgitation    Nutcracker esophagus    Orthostatic hypotension    Pneumonia    Pre-diabetes    Ruptured left breast implant, initial encounter 07/05/2018   Skin cancer AT LEAST 15 YEARS AGO, AND CONTINUING,   Sleep apnea    does not  use cpap, did not tolerate   Vertigo    Wears dentures    partial lower   Past Surgical History:  Procedure Laterality Date   AUGMENTATION MAMMAPLASTY Bilateral    BREAST EXCISIONAL BIOPSY Left    CARDIAC CATHETERIZATION  2003   Dr.Callwood, R/L heart cath,   COLONOSCOPY WITH PROPOFOL  N/A 06/12/2017   Procedure: COLONOSCOPY WITH PROPOFOL ;  Surgeon: Jinny Carmine, MD;  Location: [facility];  Service: Endoscopy;  Laterality: N/A;   dental work     ESOPHAGEAL MANOMETRY N/A 06/19/2016   Procedure: ESOPHAGEAL MANOMETRY (EM);  Surgeon: Carmine Jinny, MD;  Location: [facility];  Service: Endoscopy;  Laterality: N/A;   ESOPHAGOGASTRODUODENOSCOPY (EGD) WITH PROPOFOL  N/A 04/03/2017   Procedure: ESOPHAGOGASTRODUODENOSCOPY (EGD) WITH PROPOFOL ;  Surgeon: Jinny Carmine, MD;  Location: [facility];  Service: Endoscopy;  Laterality: N/A;  diabetic - diet controlled   EYE SURGERY  CATARACTS REMOVED - 2   THYROIDECTOMY  05/15/2015   [facility]   TONSILLECTOMY     TOTAL KNEE ARTHROPLASTY Right 10/25/2020   Procedure: RIGHT TOTAL KNEE ARTHROPLASTY;  Surgeon: Marchia Drivers, MD;  Location: [facility];  Service: Orthopedics;  Laterality: Right;  Patient Active Problem List   Diagnosis Date Noted   Insomnia due to mental condition 02/12/2024   Schatzki's ring of distal esophagus 08/11/2023   Travel advice encounter 05/12/2023   Lumbar radiculopathy 07/29/2022   CKD stage 3 secondary to diabetes (HCC) 05/17/2022   Zenkers diverticulum 05/05/2022   Presbyesophagus 01/30/2022   Prurigo nodularis 07/30/2021   Abnormal breast finding 07/30/2021   MGUS (monoclonal gammopathy of unknown significance) 01/22/2021   Anemia 01/09/2021   S/P TKR (total knee replacement) using cement, right 10/25/2020   Preoperative clearance 09/18/2020   Aortic atherosclerosis 07/10/2020   Exertional dyspnea 05/20/2020   Balance problem 05/12/2020   History of recent fall 05/12/2020   Type 2 DM with diabetic  neuropathy affecting both sides of body (HCC) 01/05/2020   Bilateral chronic knee pain 04/11/2018   S/P breast implant, silicone 03/11/2018   Osteoporosis of femur without pathological fracture 03/11/2018   Nutcracker esophagus 01/01/2018   Tubular adenoma of colon    Squamous cell skin cancer 07/21/2016   Dysphagia 04/08/2016   RBBB    Chest pain    OSA (obstructive sleep apnea) 11/22/2013   Hypovitaminosis D 01/11/2013   History of pernicious anemia 01/11/2013   Hyperlipidemia associated with type 2 diabetes mellitus (HCC) 09/04/2011   Multiple thyroid  nodules 08/27/2011   Cervical dysplasia 08/27/2011   Screening for breast cancer 03/13/2011   Iatrogenic hypothyroidism    GERD (gastroesophageal reflux disease)    Anxiety with obsessional features    Basal cell carcinoma of face 10/03/2010    ONSET DATE: 02/09/24  REFERRING DIAG: R26.89 (ICD-10-CM) - Balance problem   THERAPY DIAG:  Abnormality of gait and mobility  Difficulty in walking, not elsewhere classified  Muscle weakness (generalized)  Unsteadiness on feet  Other abnormalities of gait and mobility  Rationale for Evaluation and Treatment: Rehabilitation  SUBJECTIVE:                                                                                                                                                                                             SUBJECTIVE STATEMENT:  Pt states she is doing well. Had a good thanksgiving with no falls or LOB.   Eval: Pt reports she has had PT previously. Pt has pinched nerve in her spine that she gets shots [facility] at [facility]. Pt balance is very challenging in dark environments. Pt has had multiple falls where she has no recall of why she fell. Pt has difficulty walking straight most of the time and frequently feels like she is going to fall. Pt also has difficulty turning. Pt reports no dizziness just general  imbalance.  Pt accompanied by: self  PERTINENT HISTORY: Knee  replacement, low back pain, DM, HLD,   PAIN:  Are you having pain? Has pain in back occasionally, none in seated position at present   PRECAUTIONS: None  RED FLAGS: None   WEIGHT BEARING RESTRICTIONS: No  FALLS: Has patient fallen in last 6 months? Yes. Number of falls 2-3  LIVING ENVIRONMENT: Lives with: lives with their spouse Lives in: House/apartment Stairs: yes but level entry and does not need to go upstairs in her home  Has following equipment at home: Walker - 2 wheeled and does not regularly use AD  PLOF: Independent and Independent with basic ADLs  PATIENT GOALS: Improve balance and reduce fall risk   OBJECTIVE:  Note: Objective measures were completed at Evaluation unless otherwise noted.  COGNITION: Overall cognitive status: Within functional limits for tasks assessed   SENSATION: Not tested  LOWER EXTREMITY ROM:   WNL for tasks assessed  LOWER EXTREMITY MMT:    MMT Right Eval Left Eval  Hip flexion 4 4  Hip extension    Hip abduction 4 4  Hip adduction 4+ 4+  Hip internal rotation    Hip external rotation    Knee flexion 4+ 4+  Knee extension 4+ 4+  Ankle dorsiflexion    Ankle plantarflexion    Ankle inversion    Ankle eversion    (Blank rows = not tested)   TRANSFERS: Sit to stand: Complete Independence  Assistive device utilized: knee valgus bilateral and None     Stand to sit: Complete Independence and knee valgus noted  Assistive device utilized: None     Chair to chair: Complete Independence  Assistive device utilized: None       RAMP:  Not tested  CURB:  Not tested  STAIRS: Not tested GAIT: Findings: Gait Characteristics: decreased arm swing- Right, decreased arm swing- Left, decreased step length- Right, decreased step length- Left, decreased stride length, decreased hip/knee flexion- Right, and decreased hip/knee flexion- Left, Distance walked: 30 ft, and Comments: 1 LOB with step recovery   FUNCTIONAL TESTS:  MCTSIB:  Condition 1: Avg of 3 trials: 30 sec, Condition 2: Avg of 3 trials: 30 sec, Condition 3: Avg of 3 trials: 30 sec, Condition 4: Avg of 3 trials: 15 sec, and Total Score: 105/120 5XSTS: 21.12 sec no UE assist, B knee valgus throughout  10 Meter Walk Test: Patient instructed to walk 10 meters (32.8 ft) as quickly and as safely as possible at their normal speed Results: .58 m/s (17.06 seconds )  Cut off scores:   Household Ambulator  < 0.4 m/s  Limited Community Ambulator  0.4 - 0.8 m/s  [employer]  > 0.8 m/s  Increased fall risk  < 1.85m/s  Crossing a Street  >1.77m/s  MCID 0.05 m/s (small), 0.13 m/s (moderate), 0.06 m/s (significant)  (ANPTA Core Set of Outcome Measures for Adults with Neurologic Conditions, 2018)    PATIENT SURVEYS:   ABC scale: The Activities-Specific Balance Confidence (ABC) Scale 0% 10 20 30  40 50 60 70 80 90 100% No confidence<->completely confident  "How confident are you that you will not lose your balance or become unsteady when you . . .   Date tested   Walk around the house 50%  2. Walk up or down stairs 70%  3. Bend over and pick up a slipper from in front of a closet floor 60%  4. Reach for a small can off a shelf at  eye level 90%  5. Stand on tip toes and reach for something above your head 70%  6. Stand on a chair and reach for something 90%  7. Sweep the floor 90%  8. Walk outside the house to a car parked in the driveway 50%  9. Get into or out of a car 60%  10. Walk across a parking lot to the mall 50%  11. Walk up or down a ramp 90%  12. Walk in a crowded mall where people rapidly walk past you 60%  13. Are bumped into by people as you walk through the mall 50%  14. Step onto or off of an escalator while you are holding onto the railing 50%  15. Step onto or off an escalator while holding onto parcels such that you cannot hold onto the railing 0%  16. Walk outside on icy sidewalks 0%  Total: #/16 58.125%                                                                                                                                  TREATMENT DATE: 05/09/24   TA- To improve functional movements patterns for everyday tasks      cable pull out walk laterally x 4 ea direction with 12.5# AW, R LE propulsion and weight acceptance more difficult.  Rest between each side, high difficulty   NMR: To facilitate reeducation of movement, balance, posture, coordination, and/or proprioception/kinesthetic sense.   Balance activity with focussed turning - x 10 of picking ball up from basket then turning and walking 15 feet to put in basket 1:31 sec  Seated rest and addition of obstacles and lowered basket height for increased difficulty with bending and reaching  - second round 2:05 lowered height of ball reach and added 2 obstacles between   Ball rolling with feet x 10 small ball rolling L to R then R to L with targeted area on floor     PATIENT EDUCATION: Education details:Pt educated throughout session about proper posture and technique with exercises. Improved exercise technique, movement at target joints, use of target muscles after min to mod verbal, visual, tactile cues. Person educated: Patient Education method: Explanation Education comprehension: verbalized understanding   HOME EXERCISE PROGRAM: Access Code: [email or web address] URL: [email or web address] Date: 03/16/2024 Prepared by: Lonni Gainer  Exercises - Side Stepping with Resistance at Ankles and Counter Support  - 1 x daily - 7 x weekly - 2 sets - 15 reps - Wide tandem with counter support as needed   - 1 x daily - 7 x weekly - 3 sets - 30 sec hold - Standing Balance with Eyes Closed  - 1 x daily - 7 x weekly - 3 sets - 30 sec hold - Sit to Stand  - 1 x daily - 7 x weekly - 2 sets - 8 reps   GOALS: Goals reviewed with patient? Yes  SHORT TERM GOALS: Target date: 05/10/2024  Patient will be independent in home exercise program to improve  strength/mobility for better functional independence with ADLs. Baseline: HEP provided 10/08 Goal status: Ongoing   LONG TERM GOALS: Target date: 06/06/2024   1.  Patient will complete five times sit to stand test in < 15 seconds indicating an increased LE strength and improved balance. Baseline: 21.12 sec no UE; 11/11 25.76 Goal status: ONGOING  2.  Patient will improve ABC score to 70%   to demonstrate statistically significant improvement in mobility and quality of life as it relates to their balance confidence.  Baseline: 58.125%, 11/11 71.8% Goal status: MET   3.  Patient will increase Berg Balance score by > 6 points to demonstrate decreased fall risk during functional activities. Baseline: 39/ 54 11/11: 51/54 Goal status: MET   4.   Patient will increase 10 meter walk test to >1.57m/s as to improve gait speed for better community ambulation and to reduce fall risk. Baseline: .58 m/s, 11/11: 1.1 m/s Goal status: MET  5.   Patient will maintain stance in condition 4 of MCTSIB test for 30 sec on average of 3 trials in order to indicate improved balance in conditions of low of minimal light and compliant surface.  Baseline: 15 sec average of 3 trials, 11/11 30 sec 3 trials Goal status: MET  6.  Patient will improve DGI score by 4 points or more to reduce fall risk and demonstrate improved dynamic balance. Baseline: 11/11: 15/ 24 Goal status: INITIAL     ASSESSMENT:  CLINICAL IMPRESSION:  Patient arrived with good motivation for completion of pt activities. Pt session was cut a little short today due to pt arriving late to scheduled appointment time. Pt challenged with activities targeting SL stance, hip abduction closed chain strength and dynamic turning balance and obstacle navigation. Pt had most difficulty with lateral cable walking. Pt will continue to benefit from skilled physical therapy intervention to address impairments, improve QOL, and attain therapy goals.     OBJECTIVE IMPAIRMENTS: Abnormal gait, decreased activity tolerance, decreased balance, decreased mobility, difficulty walking, and decreased strength.   ACTIVITY LIMITATIONS: standing, squatting, stairs, transfers, and locomotion level  PARTICIPATION LIMITATIONS: community activity, occupation, and yard work  PERSONAL FACTORS: Age, Time since onset of injury/illness/exacerbation, and 3+ comorbidities: DM, Anxiety, DOE, GERD with Jackhammer esophagus are also affecting patient's functional outcome.   REHAB POTENTIAL: Good  CLINICAL DECISION MAKING: Evolving/moderate complexity  EVALUATION COMPLEXITY: Moderate  PLAN:  PT FREQUENCY: 2x/week  PT DURATION: 12 weeks  PLANNED INTERVENTIONS: 97750- Physical Performance Testing, 97110-Therapeutic exercises, 97530- Therapeutic activity, 97112- Neuromuscular re-education, 97535- Self Care, [id number]- Manual therapy, [phone]- Gait training, [phone]- Canalith repositioning, Patient/Family education, Balance training, Stair training, and Vestibular training  PLAN FOR NEXT SESSION: hip strength and balance- dynamic gait (head turns, directional changes, speed variance) - Rotational balance    Note: Portions of this document were prepared using Dragon voice recognition software and although reviewed may contain unintentional dictation errors in syntax, grammar, or spelling.  Lonni Gainer PT, DPT

## 2024-05-10 ENCOUNTER — Ambulatory Visit: Admitting: Internal Medicine

## 2024-05-10 ENCOUNTER — Encounter: Payer: Self-pay | Admitting: Internal Medicine

## 2024-05-10 VITALS — BP 120/62 | HR 72 | Ht 65.5 in | Wt 141.8 lb

## 2024-05-10 DIAGNOSIS — E032 Hypothyroidism due to medicaments and other exogenous substances: Secondary | ICD-10-CM | POA: Diagnosis not present

## 2024-05-10 DIAGNOSIS — E1142 Type 2 diabetes mellitus with diabetic polyneuropathy: Secondary | ICD-10-CM

## 2024-05-10 DIAGNOSIS — E042 Nontoxic multinodular goiter: Secondary | ICD-10-CM

## 2024-05-10 DIAGNOSIS — E559 Vitamin D deficiency, unspecified: Secondary | ICD-10-CM

## 2024-05-10 DIAGNOSIS — M810 Age-related osteoporosis without current pathological fracture: Secondary | ICD-10-CM

## 2024-05-10 DIAGNOSIS — R5382 Chronic fatigue, unspecified: Secondary | ICD-10-CM

## 2024-05-10 DIAGNOSIS — K225 Diverticulum of esophagus, acquired: Secondary | ICD-10-CM

## 2024-05-10 MED ORDER — OMEPRAZOLE 40 MG PO CPDR: 40.0000 mg | DELAYED_RELEASE_CAPSULE | Freq: Two times a day (BID) | ORAL | 3 refills | Status: AC

## 2024-05-10 MED ORDER — LEVOTHYROXINE SODIUM 125 MCG PO TABS: 125.0000 ug | ORAL_TABLET | Freq: Every day | ORAL | 0 refills | Status: DC

## 2024-05-10 NOTE — Assessment & Plan Note (Signed)
 Last DEXA was 2019 and repeated yesterday for baseline at beginning of  [facility]  trial . Forearm T score was -3.3 .  Hip scores were -1/9

## 2024-05-10 NOTE — Assessment & Plan Note (Signed)
 Baseline T score -3.3 forearm  Nov 2025 DEXA

## 2024-05-10 NOTE — Progress Notes (Unsigned)
 Subjective:  Patient ID: Megan Bradley, female    DOB: 10-19-1940  Age: 83 y.o. MRN: [id number]  CC: The primary encounter diagnosis was Osteoporosis of femur without pathological fracture. Diagnoses of Multinodular goiter, Iatrogenic hypothyroidism, Hypovitaminosis D, Type 2 DM with diabetic neuropathy affecting both sides of body (HCC), Chronic fatigue, and Zenkers diverticulum were also pertinent to this visit.   HPI Megan Bradley presents for  Chief Complaint  Patient presents with   Medical Management of Chronic Issues    1) TYPE 2 DM: She  feels generally well except for c/o fatigue,  She  is not  exercising regularly but is particcipating in PT 2 times per week. For balance .  No weight issues.   Checking  blood sugars less than once daily at variable times, usually only if she feels she may be having a hypoglycemic event. .  BS have been under 130 fasting and < 150 post prandially.  Denies any recent hypoglyemic events.  Taking   medications as directed. Following a carbohydrate modified diet 6 days per week. Denies numbness, burning and tingling of extremities. Appetite is good,  oral intake limited by esophageal issus.    2) PERSISTENT FATIGUE.  Sleeps 8-9 hours per night .  Not sleepy during the day.  No snoring reported. Denies shortness of breath,  chest tightness,   3) Recurrent leg cramps and hand /forearm cramps.  Mg was normal  in June . Taking statin every other day  4) Dysphagia with Zenker's diverticulum and nutcracker esophagus: she was advised by cardiology to allow  GI to perform procedure after the holidays and has been cleared       Outpatient Medications Prior to Visit  Medication Sig Dispense Refill   ACCU-CHEK GUIDE TEST test strip USE TO CHECK BLOOD SUGAR ONCE  DAILY 100 strip 2   ALPRAZolam  (XANAX ) 0.5 MG tablet Take 1 tablet by mouth twice daily as needed for anxiety 60 tablet 5   busPIRone  (BUSPAR ) 10 MG tablet Take 1 tablet (10 mg total) by mouth 3 (three)  times daily. 90 tablet 2   clobetasol ointment (TEMOVATE) 0.05 % SMARTSIG:sparingly Topical Twice Daily PRN     clotrimazole-betamethasone  (LOTRISONE) cream SMARTSIG:0.5 Gram(s) Topical Daily     DUPIXENT 300 MG/2ML SOPN Every other Friday     ergocalciferol  (DRISDOL ) 1.25 MG (50000 UT) capsule Take 1 capsule (50,000 Units total) by mouth once a week. 12 capsule 1   ezetimibe  (ZETIA ) 10 MG tablet Take 1 tablet (10 mg total) by mouth daily. 90 tablet 3   hyoscyamine  (ANASPAZ ) 0.125 MG TBDP disintergrating tablet PLACE ONE TABLET UNDER THE TONGUE EVERY SIX HOURS AS NEEDED 30 tablet 0   isosorbide  mononitrate (IMDUR ) 30 MG 24 hr tablet TAKE 1 TABLET BY MOUTH DAILY 100 tablet 2   meclizine  (ANTIVERT ) 25 MG tablet TAKE 1 TABLET BY MOUTH THREE TIMES DAILY AS NEEDED FOR VERTIGO 30 tablet 0   raloxifene  (EVISTA ) 60 MG tablet TAKE 1 TABLET BY MOUTH DAILY 100 tablet 3   rosuvastatin  (CRESTOR ) 20 MG tablet TAKE 1 TABLET BY MOUTH EVERY  OTHER DAY 50 tablet 2   sertraline  (ZOLOFT ) 100 MG tablet TAKE 2 TABLETS BY MOUTH DAILY 180 tablet 3   levothyroxine  (SYNTHROID ) 125 MCG tablet Take 1 tablet (125 mcg total) by mouth daily before breakfast. 90 tablet 0   omeprazole  (PRILOSEC) 40 MG capsule Take 1 capsule (40 mg total) by mouth 2 (two) times daily. 180 capsule 3  Facility-Administered Medications Prior to Visit  Medication Dose Route Frequency Provider Last Rate Last Admin   clindamycin  (CLEOCIN ) IVPB 600 mg  600 mg Intravenous Once Krasinski, Kevin, MD        Review of Systems;  Patient denies headache, fevers, malaise, unintentional weight loss, skin rash, eye pain, sinus congestion and sinus pain, sore throat, dysphagia,  hemoptysis , cough, dyspnea, wheezing, chest pain, palpitations, orthopnea, edema, abdominal pain, nausea, melena, diarrhea, constipation, flank pain, dysuria, hematuria, urinary  Frequency, nocturia, numbness, tingling, seizures,  Focal weakness, Loss of consciousness,  Tremor,  insomnia, depression, anxiety, and suicidal ideation.      Objective:  BP 120/62   Pulse 72   Ht 5' 5.5 (1.664 m)   Wt 141 lb 12.8 oz (64.3 kg)   SpO2 95%   BMI 23.24 kg/m   BP Readings from Last 3 Encounters:  05/10/24 120/62  05/03/24 120/60  04/20/24 137/80    Wt Readings from Last 3 Encounters:  05/10/24 141 lb 12.8 oz (64.3 kg)  05/03/24 142 lb (64.4 kg)  04/20/24 144 lb (65.3 kg)    Physical Exam Vitals reviewed.  Constitutional:      General: She is not in acute distress.    Appearance: Normal appearance. She is normal weight. She is not ill-appearing, toxic-appearing or diaphoretic.  HENT:     Head: Normocephalic.  Eyes:     General: No scleral icterus.       Right eye: No discharge.        Left eye: No discharge.     Conjunctiva/sclera: Conjunctivae normal.  Cardiovascular:     Rate and Rhythm: Normal rate and regular rhythm.     Heart sounds: Normal heart sounds.  Pulmonary:     Effort: Pulmonary effort is normal. No respiratory distress.     Breath sounds: Normal breath sounds.  Musculoskeletal:        General: Normal range of motion.  Skin:    General: Skin is warm and dry.  Neurological:     General: No focal deficit present.     Mental Status: She is alert and oriented to person, place, and time. Mental status is at baseline.  Psychiatric:        Mood and Affect: Mood normal.        Behavior: Behavior normal.        Thought Content: Thought content normal.        Judgment: Judgment normal.     Lab Results  Component Value Date   HGBA1C 7.1 (H) 04/05/2024   HGBA1C 6.0 12/10/2023   HGBA1C 6.9 (H) 11/10/2023    Lab Results  Component Value Date   CREATININE 0.93 11/10/2023   CREATININE 0.98 09/09/2023   CREATININE 0.85 08/10/2023    Lab Results  Component Value Date   WBC 6.2 04/20/2024   HGB 11.2 (L) 04/20/2024   HCT 34.0 (L) 04/20/2024   PLT 154 04/20/2024   GLUCOSE 82 11/10/2023   CHOL 125 11/10/2023   TRIG 49.0 11/10/2023    HDL 75.80 11/10/2023   LDLDIRECT 31.0 11/10/2023   LDLCALC 39 11/10/2023   ALT 12 11/10/2023   AST 17 11/10/2023   NA 140 11/10/2023   K 4.5 11/10/2023   CL 104 11/10/2023   CREATININE 0.93 11/10/2023   BUN 16 11/10/2023   CO2 29 11/10/2023   TSH 21.34 (H) 04/05/2024   INR 1.0 09/26/2020   HGBA1C 7.1 (H) 04/05/2024   MICROALBUR 1.6 08/10/2023    DG  Bone Density Result Date: 05/09/2024 EXAM: DUAL X-RAY ABSORPTIOMETRY (DXA) FOR BONE MINERAL DENSITY 05/09/2024 11:35 am CLINICAL DATA:  83 year old Female Postmenopausal. Screening for osteoporosis History of fragility fracture. Patient is or has been on bone building therapies. TECHNIQUE: An axial (e.g., hips, spine) and/or appendicular (e.g., radius) exam was performed, as appropriate, using GE Secretary/administrator at [facility]. Images are obtained for bone mineral density measurement and are not obtained for diagnostic purposes. [id number] Exclusions: Lumbar spine due to degenerative changes. COMPARISON:  None. New baseline. FINDINGS: Scan quality: Good. LEFT FEMORAL NECK: BMD (in g/cm2): 0.771 T-score: -1.9 Z-score: 0.3 LEFT TOTAL HIP: BMD (in g/cm2): 0.767 T-score: -1.9 Z-score: 0.2 RIGHT FEMORAL NECK: BMD (in g/cm2): 0.797 T-score: -1.7 Z-score: 0.5 RIGHT TOTAL HIP: BMD (in g/cm2): 0.805 T-score: -1.6 Z-score: 0.5 LEFT FOREARM (RADIUS 33%): BMD (in g/cm2): 0.588 T-score: -3.3 Z-score: -0.4 FRAX 10-YEAR PROBABILITY OF FRACTURE: FRAX not reported as the lowest BMD is not in the osteopenia range. IMPRESSION: Osteoporosis based on BMD. Fracture risk is unknown due to history of bone building therapy. RECOMMENDATIONS: 1. All patients should optimize calcium  and vitamin D  intake. 2. Consider FDA-approved medical therapies in postmenopausal women and men aged 15 years and older, based on the following: - A hip or vertebral (clinical or morphometric) fracture - T-score less than or equal to -2.5 and secondary causes have been  excluded. - Low bone mass (T-score between -1.0 and -2.5) and a 10-year probability of a hip fracture greater than or equal to 3% or a 10-year probability of a major osteoporosis-related fracture greater than or equal to 20% based on the [place] -adapted WHO algorithm. - Clinician judgment and/or patient preferences may indicate treatment for people with 10-year fracture probabilities above or below these levels 3. Patients with diagnosis of osteoporosis or at high risk for fracture should have regular bone mineral density tests. For patients eligible for Medicare, routine testing is allowed once every 2 years. The testing frequency can be increased to one year for patients who have rapidly progressing disease, those who are receiving or discontinuing medical therapy to restore bone mass, or have additional risk factors. Electronically Signed   By: Dina  Arceo M.D.   On: 05/09/2024 12:36    Assessment & Plan:  .Osteoporosis of femur without pathological fracture Assessment & Plan: Last DEXA was 2019 and repeated yesterday for baseline at beginning of  [employer]  trial . Forearm T score was -3.3 .  Hip scores were -1/9   Multinodular goiter  Iatrogenic hypothyroidism -     TSH; Future  Hypovitaminosis D -     VITAMIN D  25 Hydroxy (Vit-D Deficiency, Fractures); Future  Type 2 DM with diabetic neuropathy affecting both sides of body [facility]) Assessment & Plan:  Managed thus far without hypoglycemics. Patient is reminded to schedule an annual eye exam . Patient has no microalbuminuria. Patient is tolerating statin therapy for CAD risk reduction and on ACE/ARB for renal protection and hypertension     Lab Results  Component Value Date   HGBA1C 7.1 (H) 04/05/2024   Lab Results  Component Value Date   MICROALBUR 1.6 08/10/2023   Lab Results  Component Value Date   CHOL 125 11/10/2023   HDL 75.80 11/10/2023   LDLCALC 39 11/10/2023   LDLDIRECT 31.0 11/10/2023   TRIG 49.0 11/10/2023   CHOLHDL 2  11/10/2023    Orders: -     Hemoglobin A1c; Future -     Comprehensive metabolic panel with  GFR; Future -     Microalbumin / creatinine urine ratio; Future  Chronic fatigue Assessment & Plan: Screening labs non diagnostic..  she has no physical signs or symptoms of cortisol excess or deficency.  Encouarged to increase physical activity.    Zenkers diverticulum Assessment & Plan: She is now scheduled for zencker's diverticultectomy /Z-POEM in January  by [facility] after Nov 2025  barium swallow confirmed the presence of a zemcker's diverticulum   Other orders -     Omeprazole ; Take 1 capsule (40 mg total) by mouth 2 (two) times daily.  Dispense: 180 capsule; Refill: 3     I spent 34 minutes on the day of this face to face encounter reviewing patient's  most recent visit with cardiology,  hematology and gastroenterology,   prior and upcoming  surgical and non surgical procedures, recent  labs and imaging studies, counseling on diabetes, osteoporososis and fatigue ,  reviewing the assessment and plan with patient, and post visit ordering and reviewing of  diagnostics and therapeutics with patient  .   Follow-up: Return in about 3 months (around 08/08/2024).   Megan LITTIE Kettering, MD

## 2024-05-10 NOTE — Patient Instructions (Signed)
 Please return for labs on or after January 29 (thyroid , A1c vitamin D ) and continue your current medications until then

## 2024-05-11 ENCOUNTER — Ambulatory Visit: Admitting: Physical Therapy

## 2024-05-11 NOTE — Assessment & Plan Note (Addendum)
 Screening labs non diagnostic..  she has no physical signs or symptoms of cortisol excess or deficency.  Encouarged to increase physical activity.

## 2024-05-11 NOTE — Assessment & Plan Note (Signed)
 Managed thus far without hypoglycemics. Patient is reminded to schedule an annual eye exam . Patient has no microalbuminuria. Patient is tolerating statin therapy for CAD risk reduction and on ACE/ARB for renal protection and hypertension     Lab Results  Component Value Date   HGBA1C 7.1 (H) 04/05/2024   Lab Results  Component Value Date   MICROALBUR 1.6 08/10/2023   Lab Results  Component Value Date   CHOL 125 11/10/2023   HDL 75.80 11/10/2023   LDLCALC 39 11/10/2023   LDLDIRECT 31.0 11/10/2023   TRIG 49.0 11/10/2023   CHOLHDL 2 11/10/2023

## 2024-05-11 NOTE — Assessment & Plan Note (Addendum)
 She is now scheduled for zencker's diverticultectomy /Z-POEM in January  by [facility] after Nov 2025  barium swallow confirmed the presence of a zemcker's diverticulum

## 2024-05-16 ENCOUNTER — Ambulatory Visit

## 2024-05-16 DIAGNOSIS — R2681 Unsteadiness on feet: Secondary | ICD-10-CM

## 2024-05-16 DIAGNOSIS — R269 Unspecified abnormalities of gait and mobility: Secondary | ICD-10-CM

## 2024-05-16 DIAGNOSIS — M6281 Muscle weakness (generalized): Secondary | ICD-10-CM

## 2024-05-16 DIAGNOSIS — R262 Difficulty in walking, not elsewhere classified: Secondary | ICD-10-CM

## 2024-05-16 NOTE — Therapy (Signed)
 OUTPATIENT PHYSICAL THERAPY TREATMENT  Patient Name: Megan Bradley MRN: [id number] DOB:[birth year], 83 y.o., female Today's Date: 05/16/2024  PCP: Marylynn Verneita CROME, MD  REFERRING PROVIDER: Marylynn Verneita CROME, MD  END OF SESSION:  PT End of Session - 05/16/24 1503     Visit Number 16    Number of Visits 24    Date for Recertification  06/06/24    Authorization Type [employer]    Authorization Time Period 8 visits 05/11/24-06/08/24    Authorization - Visit Number 1    Authorization - Number of Visits 8    Progress Note Due on Visit 20    PT Start Time 1500    PT Stop Time 1530    PT Time Calculation (min) 30 min          Past Medical History:  Diagnosis Date   Abnormal cervical Papanicolaou smear 01/05/2020   Overview:   a.  Pap smears q 4 to 6 months.       b.  LEEP procedure 04-2009.      Allergy COEDINE; OXY DRUGS   Anemia RECENTLY   Anxiety YES, QUITE DIFFICULT AT TIMES   Anxiety disorder    Aortic atherosclerosis    Arthritis SOME; JUST HAD KNEE REPLACEMENT   both knees   Breast disorder in female 10/14/2019   Cervical dysplasia    a. 2010 - high grade squamous intraepithelial lesion s/p excision.   Chest pain    a. 2006 or 2007 [facility]): reportedly nl cors;  b. 09/2011 ETT: nl.   Depression SOME PROBABLY   Diabetes mellitus without complication (HCC) CONTROLLED WITHOUT RX   DOE (dyspnea on exertion)    Family history of adverse reaction to anesthesia    grand daughter was hard to wake up after back surgery   GERD (gastroesophageal reflux disease) YES AND A JACKHAMMER ESOPHEGUS   Grade II diastolic dysfunction    Herpes zoster    Hyperlipidemia    Hypothyroidism    Mild aortic regurgitation    Nutcracker esophagus    Orthostatic hypotension    Pneumonia    Pre-diabetes    Ruptured left breast implant, initial encounter 07/05/2018   Skin cancer AT LEAST 15 YEARS AGO, AND CONTINUING,   Sleep apnea    does not use cpap, did not tolerate   Vertigo     Wears dentures    partial lower   Past Surgical History:  Procedure Laterality Date   AUGMENTATION MAMMAPLASTY Bilateral    BREAST EXCISIONAL BIOPSY Left    CARDIAC CATHETERIZATION  2003   Dr.Callwood, R/L heart cath,   COLONOSCOPY WITH PROPOFOL  N/A 06/12/2017   Procedure: COLONOSCOPY WITH PROPOFOL ;  Surgeon: Jinny Carmine, MD;  Location: [facility];  Service: Endoscopy;  Laterality: N/A;   dental work     ESOPHAGEAL MANOMETRY N/A 06/19/2016   Procedure: ESOPHAGEAL MANOMETRY (EM);  Surgeon: Carmine Jinny, MD;  Location: [facility];  Service: Endoscopy;  Laterality: N/A;   ESOPHAGOGASTRODUODENOSCOPY (EGD) WITH PROPOFOL  N/A 04/03/2017   Procedure: ESOPHAGOGASTRODUODENOSCOPY (EGD) WITH PROPOFOL ;  Surgeon: Jinny Carmine, MD;  Location: [facility];  Service: Endoscopy;  Laterality: N/A;  diabetic - diet controlled   EYE SURGERY  CATARACTS REMOVED - 2   THYROIDECTOMY  05/15/2015   [facility]   TONSILLECTOMY     TOTAL KNEE ARTHROPLASTY Right 10/25/2020   Procedure: RIGHT TOTAL KNEE ARTHROPLASTY;  Surgeon: Marchia Drivers, MD;  Location: [facility];  Service: Orthopedics;  Laterality: Right;   Patient Active  Problem List   Diagnosis Date Noted   Insomnia due to mental condition 02/12/2024   Schatzki's ring of distal esophagus 08/11/2023   Travel advice encounter 05/12/2023   Lumbar radiculopathy 07/29/2022   CKD stage 3 secondary to diabetes (HCC) 05/17/2022   Zenkers diverticulum 05/05/2022   Presbyesophagus 01/30/2022   Prurigo nodularis 07/30/2021   Abnormal breast finding 07/30/2021   MGUS (monoclonal gammopathy of unknown significance) 01/22/2021   Anemia 01/09/2021   S/P TKR (total knee replacement) using cement, right 10/25/2020   Preoperative clearance 09/18/2020   Aortic atherosclerosis 07/10/2020   Exertional dyspnea 05/20/2020   Balance problem 05/12/2020   History of recent fall 05/12/2020   Type 2 DM with diabetic neuropathy affecting both sides of body (HCC)  01/05/2020   Bilateral chronic knee pain 04/11/2018   S/P breast implant, silicone 03/11/2018   Osteoporosis of femur without pathological fracture 03/11/2018   Nutcracker esophagus 01/01/2018   Tubular adenoma of colon    Squamous cell skin cancer 07/21/2016   Dysphagia 04/08/2016   RBBB    Chest pain    OSA (obstructive sleep apnea) 11/22/2013   Hypovitaminosis D 01/11/2013   History of pernicious anemia 01/11/2013   Fatigue 06/09/2012   Hyperlipidemia associated with type 2 diabetes mellitus (HCC) 09/04/2011   Multiple thyroid  nodules 08/27/2011   Cervical dysplasia 08/27/2011   Screening for breast cancer 03/13/2011   Iatrogenic hypothyroidism    GERD (gastroesophageal reflux disease)    Anxiety with obsessional features    Basal cell carcinoma of face 10/03/2010    ONSET DATE: 02/09/24  REFERRING DIAG: R26.89 (ICD-10-CM) - Balance problem   THERAPY DIAG:  Abnormality of gait and mobility  Difficulty in walking, not elsewhere classified  Muscle weakness (generalized)  Unsteadiness on feet  Rationale for Evaluation and Treatment: Rehabilitation  SUBJECTIVE:                                                                                                                                                                                             SUBJECTIVE STATEMENT: Pt doing well today.   PERTINENT HISTORY:  Eval: Pt reports she has had PT previously. Pt has pinched nerve in her spine that she gets shots [facility] at [facility]. Pt balance is very challenging in dark environments. Pt has had multiple falls where she has no recall of why she fell. Pt has difficulty walking straight most of the time and frequently feels like she is going to fall. Pt also has difficulty turning. Pt reports no dizziness just general imbalance.  Pt accompanied by: self. Knee replacement, low back pain, DM, HLD,  PAIN:  Are you having pain? Chronic back pain only.    PRECAUTIONS:  None  WEIGHT BEARING RESTRICTIONS: No  FALLS: Has patient fallen in last 6 months? Yes. Number of falls 2-3  LIVING ENVIRONMENT: Lives with: lives with their spouse Lives in: House/apartment Stairs: yes but level entry and does not need to go upstairs in her home  Has following equipment at home: Walker - 2 wheeled and does not regularly use AD  PLOF: Independent and Independent with basic ADLs  PATIENT GOALS: Improve balance and reduce fall risk   OBJECTIVE:  Note: Objective measures were completed at Evaluation unless otherwise noted.                                                                                                                             TREATMENT DATE: 05/16/24    -Resisted cable walkings in 4 directions, 4x ea 12.5# laterally, 7.5lb forward/backward (rest between side stepping and fwd/bwd)  -forward step ups/step downs    PATIENT EDUCATION: Education details: smaller steps smor econtrolled. Person educated: Patient Education method: Explanation Education comprehension: verbalized understanding   HOME EXERCISE PROGRAM: Access Code: [email or web address] URL: [email or web address] Date: 03/16/2024 Prepared by: Lonni Gainer  Exercises - Side Stepping with Resistance at Ankles and Counter Support  - 1 x daily - 7 x weekly - 2 sets - 15 reps - Wide tandem with counter support as needed   - 1 x daily - 7 x weekly - 3 sets - 30 sec hold - Standing Balance with Eyes Closed  - 1 x daily - 7 x weekly - 3 sets - 30 sec hold - Sit to Stand  - 1 x daily - 7 x weekly - 2 sets - 8 reps   GOALS:  Goals reviewed with patient? Yes    SHORT TERM GOALS: Target date: 05/10/2024     Patient will be independent in home exercise program to improve strength/mobility for better functional independence with ADLs. Baseline: HEP provided 10/08 Goal status: Ongoing    LONG TERM GOALS: Target date: 06/06/2024   1.  Patient will complete five times sit to  stand test in < 15 seconds indicating an increased LE strength and improved balance. Baseline: 21.12 sec no UE; 11/11 25.76 Goal status: ONGOING   2.  Patient will improve ABC score to 70%   to demonstrate statistically significant improvement in mobility and quality of life as it relates to their balance confidence.  Baseline: 58.125%, 11/11 71.8% Goal status: MET    3.  Patient will increase Berg Balance score by > 6 points to demonstrate decreased fall risk during functional activities. Baseline: 39/ 54 11/11: 51/54 Goal status: MET   4.  Patient will increase 10 meter walk test to >1.4m/s as to improve gait speed for better community ambulation and to reduce fall risk. Baseline: .58 m/s, 11/11: 1.1 m/s Goal status: MET   5.  Patient will maintain stance in condition 4  of MCTSIB test for 30 sec on average of 3 trials in order to indicate improved balance in conditions of low of minimal light and compliant surface.  Baseline: 15 sec average of 3 trials, 11/11 30 sec 3 trials Goal status: MET   6.  Patient will improve DGI score by 4 points or more to reduce fall risk and demonstrate improved dynamic balance. Baseline: 11/11: 15/ 24 Goal status: INITIAL     ASSESSMENT:  CLINICAL IMPRESSION: Continued with closed chain hip righting and balance. Pt offered recovery breaks between activities to maximize performance.  Pt will continue to benefit from skilled physical therapy intervention to address impairments, improve QOL, and attain therapy goals.    OBJECTIVE IMPAIRMENTS: Abnormal gait, decreased activity tolerance, decreased balance, decreased mobility, difficulty walking, and decreased strength.   ACTIVITY LIMITATIONS: standing, squatting, stairs, transfers, and locomotion level  PARTICIPATION LIMITATIONS: community activity, occupation, and yard work  PERSONAL FACTORS: Age, Time since onset of injury/illness/exacerbation, and 3+ comorbidities: DM, Anxiety, DOE, GERD with  Jackhammer esophagus are also affecting patient's functional outcome.   REHAB POTENTIAL: Good  CLINICAL DECISION MAKING: Evolving/moderate complexity  EVALUATION COMPLEXITY: Moderate  PLAN:  PT FREQUENCY: 2x/week  PT DURATION: 12 weeks  PLANNED INTERVENTIONS: 97750- Physical Performance Testing, 97110-Therapeutic exercises, 97530- Therapeutic activity, 97112- Neuromuscular re-education, 97535- Self Care, [id number]- Manual therapy, [phone]- Gait training, [phone]- Canalith repositioning, Patient/Family education, Balance training, Stair training, and Vestibular training  PLAN FOR NEXT SESSION:  hip strength and balance- dynamic gait (head turns, directional changes, speed variance) - Rotational balance   3:31 PM, 05/16/24 Peggye JAYSON Linear, PT, DPT Physical Therapist - [facility]  Outpatient Physical Therapy- Main Campus [phone]

## 2024-05-18 ENCOUNTER — Ambulatory Visit: Admitting: Physical Therapy

## 2024-05-18 DIAGNOSIS — R269 Unspecified abnormalities of gait and mobility: Secondary | ICD-10-CM

## 2024-05-18 DIAGNOSIS — M6281 Muscle weakness (generalized): Secondary | ICD-10-CM

## 2024-05-18 DIAGNOSIS — R262 Difficulty in walking, not elsewhere classified: Secondary | ICD-10-CM

## 2024-05-18 DIAGNOSIS — R2689 Other abnormalities of gait and mobility: Secondary | ICD-10-CM

## 2024-05-18 DIAGNOSIS — R2681 Unsteadiness on feet: Secondary | ICD-10-CM

## 2024-05-18 NOTE — Therapy (Signed)
 OUTPATIENT PHYSICAL THERAPY TREATMENT  Patient Name: Megan Bradley MRN: [id number] DOB:1941-03-12, 83 y.o., female Today's Date: 05/18/2024  PCP: Marylynn Verneita CROME, MD  REFERRING PROVIDER: Marylynn Verneita CROME, MD  END OF SESSION:  PT End of Session - 05/18/24 1601     Visit Number 17    Number of Visits 24    Date for Recertification  06/06/24    Authorization Type [employer]    Authorization Time Period 8 visits 05/11/24-06/08/24    Authorization - Visit Number 2    Authorization - Number of Visits 8    Progress Note Due on Visit 20    PT Start Time 1454    PT Stop Time 1529    PT Time Calculation (min) 35 min           Past Medical History:  Diagnosis Date   Abnormal cervical Papanicolaou smear 01/05/2020   Overview:   a.  Pap smears q 4 to 6 months.       b.  LEEP procedure 04-2009.      Allergy COEDINE; OXY DRUGS   Anemia RECENTLY   Anxiety YES, QUITE DIFFICULT AT TIMES   Anxiety disorder    Aortic atherosclerosis    Arthritis SOME; JUST HAD KNEE REPLACEMENT   both knees   Breast disorder in female 10/14/2019   Cervical dysplasia    a. 2010 - high grade squamous intraepithelial lesion s/p excision.   Chest pain    a. 2006 or 2007 Cath [facility]): reportedly nl cors;  b. 09/2011 ETT: nl.   Depression SOME PROBABLY   Diabetes mellitus without complication (HCC) CONTROLLED WITHOUT RX   DOE (dyspnea on exertion)    Family history of adverse reaction to anesthesia    grand daughter was hard to wake up after back surgery   GERD (gastroesophageal reflux disease) YES AND A JACKHAMMER ESOPHEGUS   Grade II diastolic dysfunction    Herpes zoster    Hyperlipidemia    Hypothyroidism    Mild aortic regurgitation    Nutcracker esophagus    Orthostatic hypotension    Pneumonia    Pre-diabetes    Ruptured left breast implant, initial encounter 07/05/2018   Skin cancer AT LEAST 15 YEARS AGO, AND CONTINUING,   Sleep apnea    does not use cpap, did not tolerate   Vertigo     Wears dentures    partial lower   Past Surgical History:  Procedure Laterality Date   AUGMENTATION MAMMAPLASTY Bilateral    BREAST EXCISIONAL BIOPSY Left    CARDIAC CATHETERIZATION  2003   Dr.Callwood, R/L heart cath,   COLONOSCOPY WITH PROPOFOL  N/A 06/12/2017   Procedure: COLONOSCOPY WITH PROPOFOL ;  Surgeon: Jinny Carmine, MD;  Location: [facility];  Service: Endoscopy;  Laterality: N/A;   dental work     ESOPHAGEAL MANOMETRY N/A 06/19/2016   Procedure: ESOPHAGEAL MANOMETRY (EM);  Surgeon: Carmine Jinny, MD;  Location: [facility];  Service: Endoscopy;  Laterality: N/A;   ESOPHAGOGASTRODUODENOSCOPY (EGD) WITH PROPOFOL  N/A 04/03/2017   Procedure: ESOPHAGOGASTRODUODENOSCOPY (EGD) WITH PROPOFOL ;  Surgeon: Jinny Carmine, MD;  Location: [facility];  Service: Endoscopy;  Laterality: N/A;  diabetic - diet controlled   EYE SURGERY  CATARACTS REMOVED - 2   THYROIDECTOMY  05/15/2015   [facility]   TONSILLECTOMY     TOTAL KNEE ARTHROPLASTY Right 10/25/2020   Procedure: RIGHT TOTAL KNEE ARTHROPLASTY;  Surgeon: Marchia Drivers, MD;  Location: [facility];  Service: Orthopedics;  Laterality: Right;   Patient  Active Problem List   Diagnosis Date Noted   Insomnia due to mental condition 02/12/2024   Schatzki's ring of distal esophagus 08/11/2023   Travel advice encounter 05/12/2023   Lumbar radiculopathy 07/29/2022   CKD stage 3 secondary to diabetes (HCC) 05/17/2022   Zenkers diverticulum 05/05/2022   Presbyesophagus 01/30/2022   Prurigo nodularis 07/30/2021   Abnormal breast finding 07/30/2021   MGUS (monoclonal gammopathy of unknown significance) 01/22/2021   Anemia 01/09/2021   S/P TKR (total knee replacement) using cement, right 10/25/2020   Preoperative clearance 09/18/2020   Aortic atherosclerosis 07/10/2020   Exertional dyspnea 05/20/2020   Balance problem 05/12/2020   History of recent fall 05/12/2020   Type 2 DM with diabetic neuropathy affecting both sides of body (HCC)  01/05/2020   Bilateral chronic knee pain 04/11/2018   S/P breast implant, silicone 03/11/2018   Osteoporosis of femur without pathological fracture 03/11/2018   Nutcracker esophagus 01/01/2018   Tubular adenoma of colon    Squamous cell skin cancer 07/21/2016   Dysphagia 04/08/2016   RBBB    Chest pain    OSA (obstructive sleep apnea) 11/22/2013   Hypovitaminosis D 01/11/2013   History of pernicious anemia 01/11/2013   Fatigue 06/09/2012   Hyperlipidemia associated with type 2 diabetes mellitus (HCC) 09/04/2011   Multiple thyroid  nodules 08/27/2011   Cervical dysplasia 08/27/2011   Screening for breast cancer 03/13/2011   Iatrogenic hypothyroidism    GERD (gastroesophageal reflux disease)    Anxiety with obsessional features    Basal cell carcinoma of face 10/03/2010    ONSET DATE: 02/09/24  REFERRING DIAG: R26.89 (ICD-10-CM) - Balance problem   THERAPY DIAG:  Abnormality of gait and mobility  Difficulty in walking, not elsewhere classified  Muscle weakness (generalized)  Unsteadiness on feet  Other abnormalities of gait and mobility  Rationale for Evaluation and Treatment: Rehabilitation  SUBJECTIVE:                                                                                                                                                                                             SUBJECTIVE STATEMENT: Pt doing well today.   PERTINENT HISTORY:  Eval: Pt reports she has had PT previously. Pt has pinched nerve in her spine that [facility]. Pt balance is very challenging in dark environments. Pt has had multiple falls where she has no recall of why she fell. Pt has difficulty walking straight most of the time and frequently feels like she is going to fall. Pt also has difficulty turning. Pt reports no dizziness just general imbalance.  Pt accompanied by: self.  Knee replacement, low back pain, DM, HLD,   PAIN:  Are you having pain? Chronic  back pain only.    PRECAUTIONS: None  WEIGHT BEARING RESTRICTIONS: No  FALLS: Has patient fallen in last 6 months? Yes. Number of falls 2-3  LIVING ENVIRONMENT: Lives with: lives with their spouse Lives in: House/apartment Stairs: yes but level entry and does not need to go upstairs in her home  Has following equipment at home: Walker - 2 wheeled and does not regularly use AD  PLOF: Independent and Independent with basic ADLs  PATIENT GOALS: Improve balance and reduce fall risk   OBJECTIVE:  Note: Objective measures were completed at Evaluation unless otherwise noted.                                                                                                                             TREATMENT DATE: 05/18/24    TA- To improve functional movements patterns for everyday tasks  Lateral gait on long hallway 2 x 30 ft ea direction with 2.5#AW   Lateral step up with 2.5# AE 2 x 10 ea LE, some discomfort felt in left knee on second set   Lateral step with BlueTB around knees and 2.5# AW 3 x 15 reps   STS with ball between knees for improved hip orientation and gluteal activation and cues for foot placement 2 x 10   NMR: To facilitate reeducation of movement, balance, posture, coordination, and/or proprioception/kinesthetic sense.   Staggered stance on airex ball toss anterior- difficulty getting into position x 10 with ea LE post - SPT anterior tossing ball   PATIENT EDUCATION: Education details: smaller steps smor econtrolled. Person educated: Patient Education method: Explanation Education comprehension: verbalized understanding   HOME EXERCISE PROGRAM: Access Code: [email or web address] URL: [email or web address] Date: 03/16/2024 Prepared by: Lonni Gainer  Exercises - Side Stepping with Resistance at Ankles and Counter Support  - 1 x daily - 7 x weekly - 2 sets - 15 reps - Wide tandem with counter support as needed   - 1 x daily - 7 x weekly - 3 sets - 30  sec hold - Standing Balance with Eyes Closed  - 1 x daily - 7 x weekly - 3 sets - 30 sec hold - Sit to Stand  - 1 x daily - 7 x weekly - 2 sets - 8 reps   GOALS:  Goals reviewed with patient? Yes    SHORT TERM GOALS: Target date: 05/10/2024     Patient will be independent in home exercise program to improve strength/mobility for better functional independence with ADLs. Baseline: HEP provided 10/08 Goal status: Ongoing    LONG TERM GOALS: Target date: 06/06/2024   1.  Patient will complete five times sit to stand test in < 15 seconds indicating an increased LE strength and improved balance. Baseline: 21.12 sec no UE; 11/11 25.76 Goal status: ONGOING   2.  Patient will improve ABC score to 70%  to demonstrate statistically significant improvement in mobility and quality of life as it relates to their balance confidence.  Baseline: 58.125%, 11/11 71.8% Goal status: MET    3.  Patient will increase Berg Balance score by > 6 points to demonstrate decreased fall risk during functional activities. Baseline: 39/ 54 11/11: 51/54 Goal status: MET   4.  Patient will increase 10 meter walk test to >1.64m/s as to improve gait speed for better community ambulation and to reduce fall risk. Baseline: .58 m/s, 11/11: 1.1 m/s Goal status: MET   5.  Patient will maintain stance in condition 4 of MCTSIB test for 30 sec on average of 3 trials in order to indicate improved balance in conditions of low of minimal light and compliant surface.  Baseline: 15 sec average of 3 trials, 11/11 30 sec 3 trials Goal status: MET   6.  Patient will improve DGI score by 4 points or more to reduce fall risk and demonstrate improved dynamic balance. Baseline: 11/11: 15/ 24 Goal status: INITIAL     ASSESSMENT:  CLINICAL IMPRESSION:  Patient arrived with good motivation for completion of pt activities. Pt challenged with hip strength, functional strength and balance. Pt still demonstrating gluteal  weakness and continuing to be primary focus in session.  Pt will continue to benefit from skilled physical therapy intervention to address impairments, improve QOL, and attain therapy goals.    OBJECTIVE IMPAIRMENTS: Abnormal gait, decreased activity tolerance, decreased balance, decreased mobility, difficulty walking, and decreased strength.   ACTIVITY LIMITATIONS: standing, squatting, stairs, transfers, and locomotion level  PARTICIPATION LIMITATIONS: community activity, occupation, and yard work  PERSONAL FACTORS: Age, Time since onset of injury/illness/exacerbation, and 3+ comorbidities: DM, Anxiety, DOE, GERD with Jackhammer esophagus are also affecting patient's functional outcome.   REHAB POTENTIAL: Good  CLINICAL DECISION MAKING: Evolving/moderate complexity  EVALUATION COMPLEXITY: Moderate  PLAN:  PT FREQUENCY: 2x/week  PT DURATION: 12 weeks  PLANNED INTERVENTIONS: 97750- Physical Performance Testing, 97110-Therapeutic exercises, 97530- Therapeutic activity, 97112- Neuromuscular re-education, 97535- Self Care, [id number]- Manual therapy, [phone]- Gait training, [phone]- Canalith repositioning, Patient/Family education, Balance training, Stair training, and Vestibular training  PLAN FOR NEXT SESSION:  hip strength and gluteal strength and balance- dynamic gait (head turns, directional changes, speed variance) - Rotational balance   4:02 PM, 05/18/24 Note: Portions of this document were prepared using Dragon voice recognition software and although reviewed may contain unintentional dictation errors in syntax, grammar, or spelling.  Lonni KATHEE Gainer PT ,DPT Physical Therapist- [place]  [facility]

## 2024-05-23 ENCOUNTER — Ambulatory Visit: Admitting: Physical Therapy

## 2024-05-25 ENCOUNTER — Ambulatory Visit: Admitting: Physical Therapy

## 2024-05-25 DIAGNOSIS — R269 Unspecified abnormalities of gait and mobility: Secondary | ICD-10-CM | POA: Diagnosis not present

## 2024-05-25 DIAGNOSIS — M6281 Muscle weakness (generalized): Secondary | ICD-10-CM

## 2024-05-25 DIAGNOSIS — R2681 Unsteadiness on feet: Secondary | ICD-10-CM

## 2024-05-25 DIAGNOSIS — R262 Difficulty in walking, not elsewhere classified: Secondary | ICD-10-CM

## 2024-05-25 DIAGNOSIS — R2689 Other abnormalities of gait and mobility: Secondary | ICD-10-CM

## 2024-05-25 NOTE — Therapy (Signed)
 OUTPATIENT PHYSICAL THERAPY TREATMENT  Patient Name: Megan Bradley MRN: [id number] DOB:1941/05/08, 83 y.o., female Today's Date: 05/25/2024  PCP: Marylynn Verneita CROME, MD  REFERRING PROVIDER: Marylynn Verneita CROME, MD  END OF SESSION:  PT End of Session - 05/25/24 1452     Visit Number 18    Number of Visits 24    Date for Recertification  06/06/24    Authorization Type [employer]    Authorization Time Period 8 visits 05/11/24-06/08/24    Authorization - Number of Visits 8    Progress Note Due on Visit 20    PT Start Time 1450    PT Stop Time 1528    PT Time Calculation (min) 38 min    Equipment Utilized During Treatment Gait belt    Activity Tolerance Patient tolerated treatment well    Behavior During Therapy WFL for tasks assessed/performed          Past Medical History:  Diagnosis Date   Abnormal cervical Papanicolaou smear 01/05/2020   Overview:   a.  Pap smears q 4 to 6 months.       b.  LEEP procedure 04-2009.      Allergy COEDINE; OXY DRUGS   Anemia RECENTLY   Anxiety YES, QUITE DIFFICULT AT TIMES   Anxiety disorder    Aortic atherosclerosis    Arthritis SOME; JUST HAD KNEE REPLACEMENT   both knees   Breast disorder in female 10/14/2019   Cervical dysplasia    a. 2010 - high grade squamous intraepithelial lesion s/p excision.   Chest pain    a. 2006 or 2007 Cath [facility]): reportedly nl cors;  b. 09/2011 ETT: nl.   Depression SOME PROBABLY   Diabetes mellitus without complication (HCC) CONTROLLED WITHOUT RX   DOE (dyspnea on exertion)    Family history of adverse reaction to anesthesia    grand daughter was hard to wake up after back surgery   GERD (gastroesophageal reflux disease) YES AND A JACKHAMMER ESOPHEGUS   Grade II diastolic dysfunction    Herpes zoster    Hyperlipidemia    Hypothyroidism    Mild aortic regurgitation    Nutcracker esophagus    Orthostatic hypotension    Pneumonia    Pre-diabetes    Ruptured left breast implant, initial encounter  07/05/2018   Skin cancer AT LEAST 15 YEARS AGO, AND CONTINUING,   Sleep apnea    does not use cpap, did not tolerate   Vertigo    Wears dentures    partial lower   Past Surgical History:  Procedure Laterality Date   AUGMENTATION MAMMAPLASTY Bilateral    BREAST EXCISIONAL BIOPSY Left    CARDIAC CATHETERIZATION  2003   Dr.Callwood, R/L heart cath,   COLONOSCOPY WITH PROPOFOL  N/A 06/12/2017   Procedure: COLONOSCOPY WITH PROPOFOL ;  Surgeon: Jinny Carmine, MD;  Location: [facility];  Service: Endoscopy;  Laterality: N/A;   dental work     ESOPHAGEAL MANOMETRY N/A 06/19/2016   Procedure: ESOPHAGEAL MANOMETRY (EM);  Surgeon: Carmine Jinny, MD;  Location: [facility];  Service: Endoscopy;  Laterality: N/A;   ESOPHAGOGASTRODUODENOSCOPY (EGD) WITH PROPOFOL  N/A 04/03/2017   Procedure: ESOPHAGOGASTRODUODENOSCOPY (EGD) WITH PROPOFOL ;  Surgeon: Jinny Carmine, MD;  Location: [facility];  Service: Endoscopy;  Laterality: N/A;  diabetic - diet controlled   EYE SURGERY  CATARACTS REMOVED - 2   THYROIDECTOMY  05/15/2015   [facility]   TONSILLECTOMY     TOTAL KNEE ARTHROPLASTY Right 10/25/2020   Procedure: RIGHT TOTAL KNEE  ARTHROPLASTY;  Surgeon: Marchia Drivers, MD;  Location: [facility];  Service: Orthopedics;  Laterality: Right;   Patient Active Problem List   Diagnosis Date Noted   Insomnia due to mental condition 02/12/2024   Schatzki's ring of distal esophagus 08/11/2023   Travel advice encounter 05/12/2023   Lumbar radiculopathy 07/29/2022   CKD stage 3 secondary to diabetes (HCC) 05/17/2022   Zenkers diverticulum 05/05/2022   Presbyesophagus 01/30/2022   Prurigo nodularis 07/30/2021   Abnormal breast finding 07/30/2021   MGUS (monoclonal gammopathy of unknown significance) 01/22/2021   Anemia 01/09/2021   S/P TKR (total knee replacement) using cement, right 10/25/2020   Preoperative clearance 09/18/2020   Aortic atherosclerosis 07/10/2020   Exertional dyspnea 05/20/2020    Balance problem 05/12/2020   History of recent fall 05/12/2020   Type 2 DM with diabetic neuropathy affecting both sides of body (HCC) 01/05/2020   Bilateral chronic knee pain 04/11/2018   S/P breast implant, silicone 03/11/2018   Osteoporosis of femur without pathological fracture 03/11/2018   Nutcracker esophagus 01/01/2018   Tubular adenoma of colon    Squamous cell skin cancer 07/21/2016   Dysphagia 04/08/2016   RBBB    Chest pain    OSA (obstructive sleep apnea) 11/22/2013   Hypovitaminosis D 01/11/2013   History of pernicious anemia 01/11/2013   Fatigue 06/09/2012   Hyperlipidemia associated with type 2 diabetes mellitus (HCC) 09/04/2011   Multiple thyroid  nodules 08/27/2011   Cervical dysplasia 08/27/2011   Screening for breast cancer 03/13/2011   Iatrogenic hypothyroidism    GERD (gastroesophageal reflux disease)    Anxiety with obsessional features    Basal cell carcinoma of face 10/03/2010    ONSET DATE: 02/09/24  REFERRING DIAG: R26.89 (ICD-10-CM) - Balance problem   THERAPY DIAG:  Abnormality of gait and mobility  Difficulty in walking, not elsewhere classified  Muscle weakness (generalized)  Unsteadiness on feet  Other abnormalities of gait and mobility  Rationale for Evaluation and Treatment: Rehabilitation  SUBJECTIVE:                                                                                                                                                                                             SUBJECTIVE STATEMENT: Pt reports feeling fatigued today and having increased back pain.   PERTINENT HISTORY:  Eval: Pt reports she has had PT previously. Pt has pinched nerve in her spine that she [facility] at [facility]. Pt balance is very challenging in dark environments. Pt has had multiple falls where she has no recall of why she fell. Pt has difficulty walking straight most of the time and  frequently feels like she is going to fall. Pt also has  difficulty turning. Pt reports no dizziness just general imbalance.  Pt accompanied by: self. Knee replacement, low back pain, DM, HLD,   PAIN:  Are you having pain? Chronic back pain only.    PRECAUTIONS: None  WEIGHT BEARING RESTRICTIONS: No  FALLS: Has patient fallen in last 6 months? Yes. Number of falls 2-3  LIVING ENVIRONMENT: Lives with: lives with their spouse Lives in: House/apartment Stairs: yes but level entry and does not need to go upstairs in her home  Has following equipment at home: Walker - 2 wheeled and does not regularly use AD  PLOF: Independent and Independent with basic ADLs  PATIENT GOALS: Improve balance and reduce fall risk   OBJECTIVE:  Note: Objective measures were completed at Evaluation unless otherwise noted.                                                                                                                             TREATMENT DATE: 05/25/2024   Pt session was cut a little short today due to pt arriving late to scheduled appointment time   TA- To improve functional movements patterns for everyday tasks  Gait in hallway ~60 ft each of Fwd, Bwd, Side stepping both directions with 2# AW -Progressed to directional changes cued by SPT 2 x ~24ft  STS with ball between knees for improved hip orientation and gluteal activation and cues for foot placement 2 x 10    NMR: To facilitate reeducation of movement, balance, posture, coordination, and/or proprioception/kinesthetic sense.   Wobble Board lateral and a/p 2 x 30 sec ea  -Lateral facing most challenging, tendency to lose balance to the right  Wobble board lateral facing ball toss 2 x 15  -One instance of Min A from SPT to right self   -VC to shift weight toward left  PATIENT EDUCATION: Education details: smaller steps smor econtrolled. Person educated: Patient Education method: Explanation Education comprehension: verbalized understanding   HOME EXERCISE PROGRAM: Access Code:  [email or web address] URL: [email or web address] Date: 03/16/2024 Prepared by: Lonni Gainer  Exercises - Side Stepping with Resistance at Ankles and Counter Support  - 1 x daily - 7 x weekly - 2 sets - 15 reps - Wide tandem with counter support as needed   - 1 x daily - 7 x weekly - 3 sets - 30 sec hold - Standing Balance with Eyes Closed  - 1 x daily - 7 x weekly - 3 sets - 30 sec hold - Sit to Stand  - 1 x daily - 7 x weekly - 2 sets - 8 reps   GOALS:  Goals reviewed with patient? Yes    SHORT TERM GOALS: Target date: 05/10/2024     Patient will be independent in home exercise program to improve strength/mobility for better functional independence with ADLs. Baseline: HEP provided 10/08 Goal status: Ongoing    LONG TERM GOALS: Target date: 06/06/2024  1.  Patient will complete five times sit to stand test in < 15 seconds indicating an increased LE strength and improved balance. Baseline: 21.12 sec no UE; 11/11 25.76 Goal status: ONGOING   2.  Patient will improve ABC score to 70%   to demonstrate statistically significant improvement in mobility and quality of life as it relates to their balance confidence.  Baseline: 58.125%, 11/11 71.8% Goal status: MET    3.  Patient will increase Berg Balance score by > 6 points to demonstrate decreased fall risk during functional activities. Baseline: 39/ 54 11/11: 51/54 Goal status: MET   4.  Patient will increase 10 meter walk test to >1.74m/s as to improve gait speed for better community ambulation and to reduce fall risk. Baseline: .58 m/s, 11/11: 1.1 m/s Goal status: MET   5.  Patient will maintain stance in condition 4 of MCTSIB test for 30 sec on average of 3 trials in order to indicate improved balance in conditions of low of minimal light and compliant surface.  Baseline: 15 sec average of 3 trials, 11/11 30 sec 3 trials Goal status: MET   6.  Patient will improve DGI score by 4 points or more to reduce fall  risk and demonstrate improved dynamic balance. Baseline: 11/11: 15/ 24 Goal status: INITIAL     ASSESSMENT:  CLINICAL IMPRESSION:  Patient arrived with good motivation for completion of pt activities. Pt session focused on gait with direction changes, sit to stands, and wobble board balance. Pt demonstrated good ability to change directions on cue with only one instance of imbalance that pt recovered on own. Pt continues to be challenged with sit to stands, but use of ball between the knees has helped reduce compensation of knocking knees together to rise. Wobble board in lateral facing direction provided most challenge and was later progressed to include ball toss. Pt has tendency to lean toward right, with intermittent verbal cues to shift hips toward left. Pt had one instance where SPT provided min A to regain balance, but pt provided great effort in attempting to right self. Pt will continue to benefit from skilled physical therapy intervention to address impairments, improve QOL, and attain therapy goals.    OBJECTIVE IMPAIRMENTS: Abnormal gait, decreased activity tolerance, decreased balance, decreased mobility, difficulty walking, and decreased strength.   ACTIVITY LIMITATIONS: standing, squatting, stairs, transfers, and locomotion level  PARTICIPATION LIMITATIONS: community activity, occupation, and yard work  PERSONAL FACTORS: Age, Time since onset of injury/illness/exacerbation, and 3+ comorbidities: DM, Anxiety, DOE, GERD with Jackhammer esophagus are also affecting patient's functional outcome.   REHAB POTENTIAL: Good  CLINICAL DECISION MAKING: Evolving/moderate complexity  EVALUATION COMPLEXITY: Moderate  PLAN:  PT FREQUENCY: 2x/week  PT DURATION: 12 weeks  PLANNED INTERVENTIONS: 97750- Physical Performance Testing, 97110-Therapeutic exercises, 97530- Therapeutic activity, 97112- Neuromuscular re-education, 97535- Self Care, [id number]- Manual therapy, [phone]- Gait training,  [phone]- Canalith repositioning, Patient/Family education, Balance training, Stair training, and Vestibular training  PLAN FOR NEXT SESSION:  hip strength and gluteal strength and balance- dynamic gait (head turns, directional changes, speed variance) - Rotational balance   4:18 PM, 05/25/2024 Note: Portions of this document were prepared using Dragon voice recognition software and although reviewed may contain unintentional dictation errors in syntax, grammar, or spelling.  Lonni KATHEE Gainer PT ,DPT Physical Therapist-   [facility]

## 2024-05-27 ENCOUNTER — Inpatient Hospital Stay: Admission: RE | Admit: 2024-05-27 | Discharge: 2024-05-27 | Attending: Internal Medicine | Admitting: Internal Medicine

## 2024-05-27 DIAGNOSIS — Z1231 Encounter for screening mammogram for malignant neoplasm of breast: Secondary | ICD-10-CM

## 2024-05-30 ENCOUNTER — Ambulatory Visit: Admitting: Physical Therapy

## 2024-05-30 DIAGNOSIS — R269 Unspecified abnormalities of gait and mobility: Secondary | ICD-10-CM

## 2024-05-30 DIAGNOSIS — R262 Difficulty in walking, not elsewhere classified: Secondary | ICD-10-CM

## 2024-05-30 DIAGNOSIS — M6281 Muscle weakness (generalized): Secondary | ICD-10-CM

## 2024-05-30 DIAGNOSIS — R2689 Other abnormalities of gait and mobility: Secondary | ICD-10-CM

## 2024-05-30 DIAGNOSIS — R2681 Unsteadiness on feet: Secondary | ICD-10-CM

## 2024-05-30 NOTE — Therapy (Signed)
 " OUTPATIENT PHYSICAL THERAPY TREATMENT  Patient Name: Megan Bradley MRN: [id number] DOB:08-30-1940, 83 y.o., female Today's Date: 05/30/2024  PCP: Marylynn Verneita CROME, MD  REFERRING PROVIDER: Marylynn Verneita CROME, MD  END OF SESSION:  PT End of Session - 05/30/24 1452     Visit Number 19    Number of Visits 24    Date for Recertification  06/06/24    Authorization Type [employer]    Authorization Time Period 8 visits 05/11/24-06/08/24    Authorization - Number of Visits 8    Progress Note Due on Visit 20    PT Start Time 1447    PT Stop Time 1528    PT Time Calculation (min) 41 min    Equipment Utilized During Treatment Gait belt    Activity Tolerance Patient tolerated treatment well    Behavior During Therapy WFL for tasks assessed/performed          Past Medical History:  Diagnosis Date   Abnormal cervical Papanicolaou smear 01/05/2020   Overview:   a.  Pap smears q 4 to 6 months.       b.  LEEP procedure 04-2009.      Allergy COEDINE; OXY DRUGS   Anemia RECENTLY   Anxiety YES, QUITE DIFFICULT AT TIMES   Anxiety disorder    Aortic atherosclerosis    Arthritis SOME; JUST HAD KNEE REPLACEMENT   both knees   Breast disorder in female 10/14/2019   Cervical dysplasia    a. 2010 - high grade squamous intraepithelial lesion s/p excision.   Chest pain    a. 2006 or 2007 [facility]): reportedly nl cors;  b. 09/2011 [facility]: nl.   Depression SOME PROBABLY   Diabetes mellitus without complication (HCC) CONTROLLED WITHOUT RX   DOE (dyspnea on exertion)    Family history of adverse reaction to anesthesia    grand daughter was hard to wake up after back surgery   GERD (gastroesophageal reflux disease) YES AND A JACKHAMMER ESOPHEGUS   Grade II diastolic dysfunction    Herpes zoster    Hyperlipidemia    Hypothyroidism    Mild aortic regurgitation    Nutcracker esophagus    Orthostatic hypotension    Pneumonia    Pre-diabetes    Ruptured left breast implant, initial encounter  07/05/2018   Skin cancer AT LEAST 15 YEARS AGO, AND CONTINUING,   Sleep apnea    does not use cpap, did not tolerate   Vertigo    Wears dentures    partial lower   Past Surgical History:  Procedure Laterality Date   AUGMENTATION MAMMAPLASTY Bilateral    BREAST EXCISIONAL BIOPSY Left    CARDIAC CATHETERIZATION  2003   Dr.Callwood, R/L heart cath,   COLONOSCOPY WITH PROPOFOL  N/A 06/12/2017   Procedure: COLONOSCOPY WITH PROPOFOL ;  Surgeon: Jinny Carmine, MD;  Location: [facility];  Service: Endoscopy;  Laterality: N/A;   dental work     ESOPHAGEAL MANOMETRY N/A 06/19/2016   Procedure: ESOPHAGEAL MANOMETRY (EM);  Surgeon: Carmine Jinny, MD;  Location: [facility];  Service: Endoscopy;  Laterality: N/A;   ESOPHAGOGASTRODUODENOSCOPY (EGD) WITH PROPOFOL  N/A 04/03/2017   Procedure: ESOPHAGOGASTRODUODENOSCOPY (EGD) WITH PROPOFOL ;  Surgeon: Jinny Carmine, MD;  Location: [facility];  Service: Endoscopy;  Laterality: N/A;  diabetic - diet controlled   EYE SURGERY  CATARACTS REMOVED - 2   THYROIDECTOMY  05/15/2015   [facility]   TONSILLECTOMY     TOTAL KNEE ARTHROPLASTY Right 10/25/2020   Procedure: RIGHT TOTAL  KNEE ARTHROPLASTY;  Surgeon: Marchia Drivers, MD;  Location: [facility];  Service: Orthopedics;  Laterality: Right;   Patient Active Problem List   Diagnosis Date Noted   Insomnia due to mental condition 02/12/2024   Schatzki's ring of distal esophagus 08/11/2023   Travel advice encounter 05/12/2023   Lumbar radiculopathy 07/29/2022   CKD stage 3 secondary to diabetes (HCC) 05/17/2022   Zenkers diverticulum 05/05/2022   Presbyesophagus 01/30/2022   Prurigo nodularis 07/30/2021   Abnormal breast finding 07/30/2021   MGUS (monoclonal gammopathy of unknown significance) 01/22/2021   Anemia 01/09/2021   S/P TKR (total knee replacement) using cement, right 10/25/2020   Preoperative clearance 09/18/2020   Aortic atherosclerosis 07/10/2020   Exertional dyspnea 05/20/2020    Balance problem 05/12/2020   History of recent fall 05/12/2020   Type 2 DM with diabetic neuropathy affecting both sides of body (HCC) 01/05/2020   Bilateral chronic knee pain 04/11/2018   S/P breast implant, silicone 03/11/2018   Osteoporosis of femur without pathological fracture 03/11/2018   Nutcracker esophagus 01/01/2018   Tubular adenoma of colon    Squamous cell skin cancer 07/21/2016   Dysphagia 04/08/2016   RBBB    Chest pain    OSA (obstructive sleep apnea) 11/22/2013   Hypovitaminosis D 01/11/2013   History of pernicious anemia 01/11/2013   Fatigue 06/09/2012   Hyperlipidemia associated with type 2 diabetes mellitus (HCC) 09/04/2011   Multiple thyroid  nodules 08/27/2011   Cervical dysplasia 08/27/2011   Screening for breast cancer 03/13/2011   Iatrogenic hypothyroidism    GERD (gastroesophageal reflux disease)    Anxiety with obsessional features    Basal cell carcinoma of face 10/03/2010    ONSET DATE: 02/09/24  REFERRING DIAG: R26.89 (ICD-10-CM) - Balance problem   THERAPY DIAG:  Abnormality of gait and mobility  Difficulty in walking, not elsewhere classified  Muscle weakness (generalized)  Unsteadiness on feet  Other abnormalities of gait and mobility  Rationale for Evaluation and Treatment: Rehabilitation  SUBJECTIVE:                                                                                                                                                                                             SUBJECTIVE STATEMENT: Pt reports feeling fatigued today and having increased back pain.   PERTINENT HISTORY:  Eval: Pt reports she has had PT previously. Pt has pinched nerve in her spine that she gets shots for at [facility]. Pt balance is very challenging in dark environments. Pt has had multiple falls where she has no recall of why she fell. Pt has difficulty walking straight most of the time  and frequently feels like she is going to fall. Pt also has  difficulty turning. Pt reports no dizziness just general imbalance.  Pt accompanied by: self. Knee replacement, low back pain, DM, HLD,   PAIN:  Are you having pain? Chronic back pain only.    PRECAUTIONS: None  WEIGHT BEARING RESTRICTIONS: No  FALLS: Has patient fallen in last 6 months? Yes. Number of falls 2-3  LIVING ENVIRONMENT: Lives with: lives with their spouse Lives in: House/apartment Stairs: yes but level entry and does not need to go upstairs in her home  Has following equipment at home: Walker - 2 wheeled and does not regularly use AD  PLOF: Independent and Independent with basic ADLs  PATIENT GOALS: Improve balance and reduce fall risk   OBJECTIVE:  Note: Objective measures were completed at Evaluation unless otherwise noted.                                                                                                                             TREATMENT DATE: 05/30/2024   TA- To improve functional movements patterns for everyday tasks  Nustep with MHP on low back x 8 min L 2-4   Gait in hallway ~60 ft each of Fwd, Bwd, Side stepping both directions with 2# AW and RTB around knees  -Progressed to directional changes cued by SPT 2 x ~27ft  STS with ball between knees for improved hip orientation and gluteal activation and cues for foot placement 2 x 10 -cues to prevent knee press on ball to improve balance and stability     NMR: To facilitate reeducation of movement, balance, posture, coordination, and/or proprioception/kinesthetic sense.   Wobble Board lateral 5 x 30 sec ea  -Lateral facing most challenging, tendency to lose balance to the right  -standing rest on board with UE support between each.   PATIENT EDUCATION: Education details: smaller steps smor econtrolled. Person educated: Patient Education method: Explanation Education comprehension: verbalized understanding   HOME EXERCISE PROGRAM: Access Code: [email or web address] URL:  [email or web address] Date: 03/16/2024 Prepared by: Lonni Gainer  Exercises - Side Stepping with Resistance at Ankles and Counter Support  - 1 x daily - 7 x weekly - 2 sets - 15 reps - Wide tandem with counter support as needed   - 1 x daily - 7 x weekly - 3 sets - 30 sec hold - Standing Balance with Eyes Closed  - 1 x daily - 7 x weekly - 3 sets - 30 sec hold - Sit to Stand  - 1 x daily - 7 x weekly - 2 sets - 8 reps   GOALS:  Goals reviewed with patient? Yes    SHORT TERM GOALS: Target date: 05/10/2024     Patient will be independent in home exercise program to improve strength/mobility for better functional independence with ADLs. Baseline: HEP provided 10/08 Goal status: Ongoing    LONG TERM GOALS: Target date: 06/06/2024   1.  Patient will complete  five times sit to stand test in < 15 seconds indicating an increased LE strength and improved balance. Baseline: 21.12 sec no UE; 11/11 25.76 Goal status: ONGOING   2.  Patient will improve ABC score to 70%   to demonstrate statistically significant improvement in mobility and quality of life as it relates to their balance confidence.  Baseline: 58.125%, 11/11 71.8% Goal status: MET    3.  Patient will increase Berg Balance score by > 6 points to demonstrate decreased fall risk during functional activities. Baseline: 39/ 54 11/11: 51/54 Goal status: MET   4.  Patient will increase 10 meter walk test to >1.109m/s as to improve gait speed for better community ambulation and to reduce fall risk. Baseline: .58 m/s, 11/11: 1.1 m/s Goal status: MET   5.  Patient will maintain stance in condition 4 of MCTSIB test for 30 sec on average of 3 trials in order to indicate improved balance in conditions of low of minimal light and compliant surface.  Baseline: 15 sec average of 3 trials, 11/11 30 sec 3 trials Goal status: MET   6.  Patient will improve DGI score by 4 points or more to reduce fall risk and  demonstrate improved dynamic balance. Baseline: 11/11: 15/ 24 Goal status: INITIAL     ASSESSMENT:  CLINICAL IMPRESSION:  Patient arrived with good motivation for completion of pt activities. Pt having LBP upon arrival so started with functional movement on nustep with concurrent. Pt session focused on gait with direction changes, sit to stands, and wobble board balance. Wobble board and frontal plane balance and strength continues to be a challenge. Pt will continue to benefit from skilled physical therapy intervention to address impairments, improve QOL, and attain therapy goals.    OBJECTIVE IMPAIRMENTS: Abnormal gait, decreased activity tolerance, decreased balance, decreased mobility, difficulty walking, and decreased strength.   ACTIVITY LIMITATIONS: standing, squatting, stairs, transfers, and locomotion level  PARTICIPATION LIMITATIONS: community activity, occupation, and yard work  PERSONAL FACTORS: Age, Time since onset of injury/illness/exacerbation, and 3+ comorbidities: DM, Anxiety, DOE, GERD with Jackhammer esophagus are also affecting patient's functional outcome.   REHAB POTENTIAL: Good  CLINICAL DECISION MAKING: Evolving/moderate complexity  EVALUATION COMPLEXITY: Moderate  PLAN:  PT FREQUENCY: 2x/week  PT DURATION: 12 weeks  PLANNED INTERVENTIONS: 97750- Physical Performance Testing, 97110-Therapeutic exercises, 97530- Therapeutic activity, 97112- Neuromuscular re-education, 97535- Self Care, [id number]- Manual therapy, [id number]- Gait training, [phone]- Canalith repositioning, Patient/Family education, Balance training, Stair training, and Vestibular training  PLAN FOR NEXT SESSION:  hip strength and gluteal strength and balance- dynamic gait (head turns, directional changes, speed variance) - Rotational balance   2:53 PM, 05/30/2024 Note: Portions of this document were prepared using Dragon voice recognition software and although reviewed may contain unintentional  dictation errors in syntax, grammar, or spelling.  Lonni KATHEE Gainer PT ,DPT Physical Therapist- [place]  [facility]         "

## 2024-06-01 ENCOUNTER — Ambulatory Visit: Admitting: Physical Therapy

## 2024-06-06 ENCOUNTER — Ambulatory Visit: Admitting: Physical Therapy

## 2024-06-06 ENCOUNTER — Other Ambulatory Visit: Payer: Self-pay | Admitting: Internal Medicine

## 2024-06-06 DIAGNOSIS — R269 Unspecified abnormalities of gait and mobility: Secondary | ICD-10-CM | POA: Diagnosis not present

## 2024-06-06 DIAGNOSIS — R262 Difficulty in walking, not elsewhere classified: Secondary | ICD-10-CM

## 2024-06-06 DIAGNOSIS — R2681 Unsteadiness on feet: Secondary | ICD-10-CM

## 2024-06-06 DIAGNOSIS — R2689 Other abnormalities of gait and mobility: Secondary | ICD-10-CM

## 2024-06-06 DIAGNOSIS — M6281 Muscle weakness (generalized): Secondary | ICD-10-CM

## 2024-06-06 NOTE — Therapy (Signed)
 " OUTPATIENT PHYSICAL THERAPY TREATMENT  Patient Name: Megan Bradley MRN: [id number] DOB:06/20/1940, 83 y.o., female Today's Date: 06/06/2024  PCP: Marylynn Verneita CROME, MD  REFERRING PROVIDER: Marylynn Verneita CROME, MD  END OF SESSION:  PT End of Session - 06/06/24 1453     Visit Number 20    Number of Visits 28    Date for Recertification  07/04/24    Authorization Type [employer]    Authorization Time Period 8 visits 05/11/24-06/08/24    Authorization - Number of Visits 8    Progress Note Due on Visit 20    PT Start Time 1450    PT Stop Time 1528    PT Time Calculation (min) 38 min    Equipment Utilized During Treatment Gait belt    Activity Tolerance Patient tolerated treatment well    Behavior During Therapy WFL for tasks assessed/performed          Past Medical History:  Diagnosis Date   Abnormal cervical Papanicolaou smear 01/05/2020   Overview:   a.  Pap smears q 4 to 6 months.       b.  LEEP procedure 04-2009.      Allergy COEDINE; OXY DRUGS   Anemia RECENTLY   Anxiety YES, QUITE DIFFICULT AT TIMES   Anxiety disorder    Aortic atherosclerosis    Arthritis SOME; JUST HAD KNEE REPLACEMENT   both knees   Breast disorder in female 10/14/2019   Cervical dysplasia    a. 2010 - high grade squamous intraepithelial lesion s/p excision.   Chest pain    a. 2006 or 2007 [facility]): reportedly nl cors;  b. 09/2011 ETT: nl.   Depression SOME PROBABLY   Diabetes mellitus without complication (HCC) CONTROLLED WITHOUT RX   DOE (dyspnea on exertion)    Family history of adverse reaction to anesthesia    grand daughter was hard to wake up after back surgery   GERD (gastroesophageal reflux disease) YES AND A JACKHAMMER ESOPHEGUS   Grade II diastolic dysfunction    Herpes zoster    Hyperlipidemia    Hypothyroidism    Mild aortic regurgitation    Nutcracker esophagus    Orthostatic hypotension    Pneumonia    Pre-diabetes    Ruptured left breast implant, initial encounter  07/05/2018   Skin cancer AT LEAST 15 YEARS AGO, AND CONTINUING,   Sleep apnea    does not use cpap, did not tolerate   Vertigo    Wears dentures    partial lower   Past Surgical History:  Procedure Laterality Date   AUGMENTATION MAMMAPLASTY Bilateral    BREAST EXCISIONAL BIOPSY Left    CARDIAC CATHETERIZATION  2003   Dr.Callwood, R/L heart cath,   COLONOSCOPY WITH PROPOFOL  N/A 06/12/2017   Procedure: COLONOSCOPY WITH PROPOFOL ;  Surgeon: Jinny Carmine, MD;  Location: [facility];  Service: Endoscopy;  Laterality: N/A;   dental work     ESOPHAGEAL MANOMETRY N/A 06/19/2016   Procedure: ESOPHAGEAL MANOMETRY (EM);  Surgeon: Carmine Jinny, MD;  Location: [facility];  Service: Endoscopy;  Laterality: N/A;   ESOPHAGOGASTRODUODENOSCOPY (EGD) WITH PROPOFOL  N/A 04/03/2017   Procedure: ESOPHAGOGASTRODUODENOSCOPY (EGD) WITH PROPOFOL ;  Surgeon: Jinny Carmine, MD;  Location: [facility];  Service: Endoscopy;  Laterality: N/A;  diabetic - diet controlled   EYE SURGERY  CATARACTS REMOVED - 2   THYROIDECTOMY  05/15/2015   [facility]   TONSILLECTOMY     TOTAL KNEE ARTHROPLASTY Right 10/25/2020   Procedure: RIGHT TOTAL  KNEE ARTHROPLASTY;  Surgeon: Marchia Drivers, MD;  Location: [facility];  Service: Orthopedics;  Laterality: Right;   Patient Active Problem List   Diagnosis Date Noted   Insomnia due to mental condition 02/12/2024   Schatzki's ring of distal esophagus 08/11/2023   Travel advice encounter 05/12/2023   Lumbar radiculopathy 07/29/2022   CKD stage 3 secondary to diabetes (HCC) 05/17/2022   Zenkers diverticulum 05/05/2022   Presbyesophagus 01/30/2022   Prurigo nodularis 07/30/2021   Abnormal breast finding 07/30/2021   MGUS (monoclonal gammopathy of unknown significance) 01/22/2021   Anemia 01/09/2021   S/P TKR (total knee replacement) using cement, right 10/25/2020   Preoperative clearance 09/18/2020   Aortic atherosclerosis 07/10/2020   Exertional dyspnea 05/20/2020    Balance problem 05/12/2020   History of recent fall 05/12/2020   Type 2 DM with diabetic neuropathy affecting both sides of body (HCC) 01/05/2020   Bilateral chronic knee pain 04/11/2018   S/P breast implant, silicone 03/11/2018   Osteoporosis of femur without pathological fracture 03/11/2018   Nutcracker esophagus 01/01/2018   Tubular adenoma of colon    Squamous cell skin cancer 07/21/2016   Dysphagia 04/08/2016   RBBB    Chest pain    OSA (obstructive sleep apnea) 11/22/2013   Hypovitaminosis D 01/11/2013   History of pernicious anemia 01/11/2013   Fatigue 06/09/2012   Hyperlipidemia associated with type 2 diabetes mellitus (HCC) 09/04/2011   Multiple thyroid  nodules 08/27/2011   Cervical dysplasia 08/27/2011   Screening for breast cancer 03/13/2011   Iatrogenic hypothyroidism    GERD (gastroesophageal reflux disease)    Anxiety with obsessional features    Basal cell carcinoma of face 10/03/2010    ONSET DATE: 02/09/24  REFERRING DIAG: R26.89 (ICD-10-CM) - Balance problem   THERAPY DIAG:  Abnormality of gait and mobility  Difficulty in walking, not elsewhere classified  Muscle weakness (generalized)  Unsteadiness on feet  Other abnormalities of gait and mobility  Rationale for Evaluation and Treatment: Rehabilitation  SUBJECTIVE:                                                                                                                                                                                             SUBJECTIVE STATEMENT: Pt reports feeling fatigued today and having increased back pain.   PERTINENT HISTORY:  Eval: Pt reports she has had PT previously. Pt has pinched nerve in her spine that she gets shots for at [facility]. Pt balance is very challenging in dark environments. Pt has had multiple falls where she has no recall of why she fell. Pt has difficulty walking straight most of the time  and frequently feels like she is going to fall. Pt also has  difficulty turning. Pt reports no dizziness just general imbalance.  Pt accompanied by: self. Knee replacement, low back pain, DM, HLD,   PAIN:  Are you having pain? Chronic back pain only.    PRECAUTIONS: None  WEIGHT BEARING RESTRICTIONS: No  FALLS: Has patient fallen in last 6 months? Yes. Number of falls 2-3  LIVING ENVIRONMENT: Lives with: lives with their spouse Lives in: House/apartment Stairs: yes but level entry and does not need to go upstairs in her home  Has following equipment at home: Walker - 2 wheeled and does not regularly use AD  PLOF: Independent and Independent with basic ADLs  PATIENT GOALS: Improve balance and reduce fall risk   OBJECTIVE:  Note: Objective measures were completed at Evaluation unless otherwise noted.                                                                                                                             TREATMENT DATE: 06/06/2024  Five times Sit to Stand Test (FTSS)  TIME: 13.1 sec  Cut off scores indicative of increased fall risk: >12 sec CVA, >16 sec PD, >13 sec vestibular (ANPTA Core Set of Outcome Measures for Adults with Neurologic Conditions, 2018)  PT instructed pt in DGI. See below for results. Demonstrates increased fall risk with score of 18/24. (<19 indicates increased fall risk)    [facility] PT Assessment - 06/06/24 0001       Dynamic Gait Index   Level Surface Normal    Change in Gait Speed Normal    Gait with Horizontal Head Turns Moderate Impairment    Gait with Vertical Head Turns Mild Impairment    Gait and Pivot Turn Mild Impairment    Step Over Obstacle Normal    Step Around Obstacles Mild Impairment    Steps Mild Impairment    Total Score 18         NMR: To facilitate reeducation of movement, balance, posture, coordination, and/or proprioception/kinesthetic sense.  EC on airex 3 x 30 sec  EC on airex 2 x 30 sec LE with one LE on airex other on 6 in step anterior  EC on airex 2 x 30 sec ea  LE with one LE on airex other on 6 in step laterally  TA- To improve functional movements patterns for everyday tasks   Sidestep and squat with band around knees with focus on knee alignment x 10 total reps (5ea)   PATIENT EDUCATION: Education details: smaller steps smor econtrolled. Person educated: Patient Education method: Explanation Education comprehension: verbalized understanding   HOME EXERCISE PROGRAM: Access Code: [email or web address] URL: [email or web address] Date: 03/16/2024 Prepared by: Lonni Gainer  Exercises - Side Stepping with Resistance at Ankles and Counter Support  - 1 x daily - 7 x weekly - 2 sets - 15 reps - Wide tandem with counter support as needed   - 1 x daily -  7 x weekly - 3 sets - 30 sec hold - Standing Balance with Eyes Closed  - 1 x daily - 7 x weekly - 3 sets - 30 sec hold - Sit to Stand  - 1 x daily - 7 x weekly - 2 sets - 8 reps   GOALS:  Goals reviewed with patient? Yes    SHORT TERM GOALS: Target date: 05/10/2024     Patient will be independent in home exercise program to improve strength/mobility for better functional independence with ADLs. Baseline: HEP provided 10/08 Goal status: Ongoing    LONG TERM GOALS: Target date: 06/06/2024   1.  Patient will complete five times sit to stand test in < 15 seconds indicating an increased LE strength and improved balance. Baseline: 21.12 sec no UE; 11/11: 25.76 12/29:13.1 sec Goal status: MET   2.  Patient will improve ABC score to 70%   to demonstrate statistically significant improvement in mobility and quality of life as it relates to their balance confidence.  Baseline: 58.125%, 11/11 71.8% Goal status: MET    3.  Patient will increase Berg Balance score by > 6 points to demonstrate decreased fall risk during functional activities. Baseline: 39/ 56 11/11: 51/56 Goal status: MET   4.  Patient will increase 10 meter walk test to >1.31m/s as to improve gait speed for better  community ambulation and to reduce fall risk. Baseline: .58 m/s, 11/11: 1.1 m/s Goal status: MET   5.  Patient will maintain stance in condition 4 of MCTSIB test for 30 sec on average of 3 trials in order to indicate improved balance in conditions of low of minimal light and compliant surface.  Baseline: 15 sec average of 3 trials, 11/11: 30 sec 3 trials Goal status: MET   6.  Patient will improve DGI score by 4 points or more to reduce fall risk and demonstrate improved dynamic balance. Baseline: 11/11: 15/ 24 12/29: 18/24 Goal status: INITIAL   7.  Patient will report  moderate confidence with turning around in tight spaces as well as with navigation and low lit environment such as in yard with her dog at night.  Baseline:12/29: low confidence- feels need to grab hold of objects and general unsteadiness Goal status: INITIAL       ASSESSMENT:  CLINICAL IMPRESSION:  Pt presents for re-certification note this date. Pt has shown great progress towards many of her balance related goals during therapy.  Patient has shown significant improvement in her lower extremity strength as evidenced by improved 5 times sit to stand since previous testing and assessment.  Is important to note that patient's knees with sit to stand continue to show high level of valgus force indicating some weakness into her hips but this is being addressed actively.  Boot dynamic gait index score continues to improve but based on score patient is still at higher risk of falls.  Is important to note patient's progress and she will likely continue to progress in therapy with further treatment.  Plan is to continue pleat 4 more weeks of treatment with goal to achieve patient's long-term goals and have established and progressive home exercise program in place. Patient's condition has the potential to improve in response to therapy. Maximum improvement is yet to be obtained. The anticipated improvement is attainable and  reasonable in a generally predictable time.  Pt will continue to benefit from skilled physical therapy intervention to address impairments, improve QOL, and attain therapy goals.     OBJECTIVE  IMPAIRMENTS: Abnormal gait, decreased activity tolerance, decreased balance, decreased mobility, difficulty walking, and decreased strength.   ACTIVITY LIMITATIONS: standing, squatting, stairs, transfers, and locomotion level  PARTICIPATION LIMITATIONS: community activity, occupation, and yard work  PERSONAL FACTORS: Age, Time since onset of injury/illness/exacerbation, and 3+ comorbidities: DM, Anxiety, DOE, GERD with Jackhammer esophagus are also affecting patient's functional outcome.   REHAB POTENTIAL: Good  CLINICAL DECISION MAKING: Evolving/moderate complexity  EVALUATION COMPLEXITY: Moderate  PLAN:  PT FREQUENCY: 2x/week  PT DURATION:4 weeks  PLANNED INTERVENTIONS: 97750- Physical Performance Testing, 97110-Therapeutic exercises, 97530- Therapeutic activity, 97112- Neuromuscular re-education, 97535- Self Care, [id number]- Manual therapy, [phone]- Gait training, [phone]- Canalith repositioning, Patient/Family education, Balance training, Stair training, and Vestibular training  PLAN FOR NEXT SESSION:  hip strength and gluteal strength and balance- dynamic gait (head turns, directional changes, speed variance) - Rotational balance   3:49 PM, 06/06/2024 Note: Portions of this document were prepared using Dragon voice recognition software and although reviewed may contain unintentional dictation errors in syntax, grammar, or spelling.  Lonni KATHEE Gainer PT ,DPT Physical Therapist- [place]  [facility]         "

## 2024-06-08 ENCOUNTER — Ambulatory Visit: Admitting: Physical Therapy

## 2024-06-08 DIAGNOSIS — M6281 Muscle weakness (generalized): Secondary | ICD-10-CM

## 2024-06-08 DIAGNOSIS — R269 Unspecified abnormalities of gait and mobility: Secondary | ICD-10-CM | POA: Diagnosis not present

## 2024-06-08 DIAGNOSIS — R2689 Other abnormalities of gait and mobility: Secondary | ICD-10-CM

## 2024-06-08 DIAGNOSIS — R262 Difficulty in walking, not elsewhere classified: Secondary | ICD-10-CM

## 2024-06-08 DIAGNOSIS — R2681 Unsteadiness on feet: Secondary | ICD-10-CM

## 2024-06-08 NOTE — Therapy (Signed)
 " OUTPATIENT PHYSICAL THERAPY TREATMENT  Patient Name: Megan Bradley MRN: [id number] DOB:12/06/1940, 83 y.o., female Today's Date: 06/08/2024  PCP: Marylynn Verneita CROME, MD  REFERRING PROVIDER: Marylynn Verneita CROME, MD  END OF SESSION:  PT End of Session - 06/08/24 1502     Visit Number 21    Number of Visits 28    Date for Recertification  07/04/24    Authorization Type [employer]    Authorization Time Period 8 visits 05/11/24-06/08/24    Authorization - Number of Visits 8    Progress Note Due on Visit 20    Equipment Utilized During Treatment Gait belt    Activity Tolerance Patient tolerated treatment well    Behavior During Therapy [facility] for tasks assessed/performed          Past Medical History:  Diagnosis Date   Abnormal cervical Papanicolaou smear 01/05/2020   Overview:   a.  Pap smears q 4 to 6 months.       b.  LEEP procedure 04-2009.      Allergy COEDINE; OXY DRUGS   Anemia RECENTLY   Anxiety YES, QUITE DIFFICULT AT TIMES   Anxiety disorder    Aortic atherosclerosis    Arthritis SOME; JUST HAD KNEE REPLACEMENT   both knees   Breast disorder in female 10/14/2019   Cervical dysplasia    a. 2010 - high grade squamous intraepithelial lesion s/p excision.   Chest pain    a. 2006 or 2007 Cath [facility]): reportedly nl cors;  b. 09/2011 ETT: nl.   Depression SOME PROBABLY   Diabetes mellitus without complication (HCC) CONTROLLED WITHOUT RX   DOE (dyspnea on exertion)    Family history of adverse reaction to anesthesia    grand daughter was hard to wake up after back surgery   GERD (gastroesophageal reflux disease) YES AND A JACKHAMMER ESOPHEGUS   Grade II diastolic dysfunction    Herpes zoster    Hyperlipidemia    Hypothyroidism    Mild aortic regurgitation    Nutcracker esophagus    Orthostatic hypotension    Pneumonia    Pre-diabetes    Ruptured left breast implant, initial encounter 07/05/2018   Skin cancer AT LEAST 15 YEARS AGO, AND CONTINUING,   Sleep apnea     does not use cpap, did not tolerate   Vertigo    Wears dentures    partial lower   Past Surgical History:  Procedure Laterality Date   AUGMENTATION MAMMAPLASTY Bilateral    BREAST EXCISIONAL BIOPSY Left    CARDIAC CATHETERIZATION  2003   Dr.Callwood, R/L heart cath,   COLONOSCOPY WITH PROPOFOL  N/A 06/12/2017   Procedure: COLONOSCOPY WITH PROPOFOL ;  Surgeon: Jinny Carmine, MD;  Location: [facility];  Service: Endoscopy;  Laterality: N/A;   dental work     ESOPHAGEAL MANOMETRY N/A 06/19/2016   Procedure: ESOPHAGEAL MANOMETRY (EM);  Surgeon: Carmine Jinny, MD;  Location: [facility];  Service: Endoscopy;  Laterality: N/A;   ESOPHAGOGASTRODUODENOSCOPY (EGD) WITH PROPOFOL  N/A 04/03/2017   Procedure: ESOPHAGOGASTRODUODENOSCOPY (EGD) WITH PROPOFOL ;  Surgeon: Jinny Carmine, MD;  Location: [facility];  Service: Endoscopy;  Laterality: N/A;  diabetic - diet controlled   EYE SURGERY  CATARACTS REMOVED - 2   THYROIDECTOMY  05/15/2015   [facility]   TONSILLECTOMY     TOTAL KNEE ARTHROPLASTY Right 10/25/2020   Procedure: RIGHT TOTAL KNEE ARTHROPLASTY;  Surgeon: Marchia Drivers, MD;  Location: [facility] OR[facility];  Service: Orthopedics;  Laterality: Right;   Patient Active Problem List  Diagnosis Date Noted   Insomnia due to mental condition 02/12/2024   Schatzki's ring of distal esophagus 08/11/2023   Travel advice encounter 05/12/2023   Lumbar radiculopathy 07/29/2022   CKD stage 3 secondary to diabetes (HCC) 05/17/2022   Zenkers diverticulum 05/05/2022   Presbyesophagus 01/30/2022   Prurigo nodularis 07/30/2021   Abnormal breast finding 07/30/2021   MGUS (monoclonal gammopathy of unknown significance) 01/22/2021   Anemia 01/09/2021   S/P TKR (total knee replacement) using cement, right 10/25/2020   Preoperative clearance 09/18/2020   Aortic atherosclerosis 07/10/2020   Exertional dyspnea 05/20/2020   Balance problem 05/12/2020   History of recent fall 05/12/2020   Type 2 DM with  diabetic neuropathy affecting both sides of body (HCC) 01/05/2020   Bilateral chronic knee pain 04/11/2018   S/P breast implant, silicone 03/11/2018   Osteoporosis of femur without pathological fracture 03/11/2018   Nutcracker esophagus 01/01/2018   Tubular adenoma of colon    Squamous cell skin cancer 07/21/2016   Dysphagia 04/08/2016   RBBB    Chest pain    OSA (obstructive sleep apnea) 11/22/2013   Hypovitaminosis D 01/11/2013   History of pernicious anemia 01/11/2013   Fatigue 06/09/2012   Hyperlipidemia associated with type 2 diabetes mellitus (HCC) 09/04/2011   Multiple thyroid  nodules 08/27/2011   Cervical dysplasia 08/27/2011   Screening for breast cancer 03/13/2011   Iatrogenic hypothyroidism    GERD (gastroesophageal reflux disease)    Anxiety with obsessional features    Basal cell carcinoma of face 10/03/2010    ONSET DATE: 02/09/24  REFERRING DIAG: R26.89 (ICD-10-CM) - Balance problem   THERAPY DIAG:  No diagnosis found.  Rationale for Evaluation and Treatment: Rehabilitation  SUBJECTIVE:                                                                                                                                                                                             SUBJECTIVE STATEMENT: Pt reports increased back pain this date.   PERTINENT HISTORY:  Eval: Pt reports she has had PT previously. Pt has pinched nerve in her spine that [facility]. Pt balance is very challenging in dark environments. Pt has had multiple falls where she has no recall of why she fell. Pt has difficulty walking straight most of the time and frequently feels like she is going to fall. Pt also has difficulty turning. Pt reports no dizziness just general imbalance.  Pt accompanied by: self. Knee replacement, low back pain, DM, HLD,   PAIN:  Are you having pain? Chronic back pain only.    PRECAUTIONS: None  WEIGHT  BEARING RESTRICTIONS: No  FALLS: Has  patient fallen in last 6 months? Yes. Number of falls 2-3  LIVING ENVIRONMENT: Lives with: lives with their spouse Lives in: House/apartment Stairs: yes but level entry and does not need to go upstairs in her home  Has following equipment at home: Walker - 2 wheeled and does not regularly use AD  PLOF: Independent and Independent with basic ADLs  PATIENT GOALS: Improve balance and reduce fall risk   OBJECTIVE:  Note: Objective measures were completed at Evaluation unless otherwise noted.                                                                                                                             TREATMENT DATE: 06/08/2024   TA- To improve functional movements patterns for everyday tasks   Nustep with MHP on back to improve comfort in preparation for later activities and for B LE and UE reciprocal movement training   NMR: To facilitate reeducation of movement, balance, posture, coordination, and/or proprioception/kinesthetic sense.  EC on airex 3 x 30 sec with NBOS  Airex to airex beam to airex -> 180 deg turn then repeat x 10 - intermittent UE assist.- significant hip weakness noted in single leg stance on B Les. - min A at times for fall prevention.  - Same setup above but stepping lateral but no turning x 10 ea direction   Self care:  - explanation of hip weakness and ways she has been prescribed exercise at home to improve. Pt verbalized understanding of concept.   Unless otherwise stated, CGA was provided and gait belt donned in order to ensure pt safety    PATIENT EDUCATION: Education details: smaller steps smor econtrolled. Person educated: Patient Education method: Explanation Education comprehension: verbalized understanding   HOME EXERCISE PROGRAM: Access Code: [email or web address] URL: [email or web address] Date: 03/16/2024 Prepared by: Lonni Gainer  Exercises - Side Stepping with Resistance at Ankles and Counter Support  - 1 x daily - 7  x weekly - 2 sets - 15 reps - Wide tandem with counter support as needed   - 1 x daily - 7 x weekly - 3 sets - 30 sec hold - Standing Balance with Eyes Closed  - 1 x daily - 7 x weekly - 3 sets - 30 sec hold - Sit to Stand  - 1 x daily - 7 x weekly - 2 sets - 8 reps   GOALS:  Goals reviewed with patient? Yes    SHORT TERM GOALS: Target date: 05/10/2024     Patient will be independent in home exercise program to improve strength/mobility for better functional independence with ADLs. Baseline: HEP provided 10/08 Goal status: Ongoing    LONG TERM GOALS: Target date: 06/06/2024   1.  Patient will complete five times sit to stand test in < 15 seconds indicating an increased LE strength and improved balance. Baseline: 21.12 sec no UE; 11/11: 25.76 12/29:13.1 sec Goal status: MET  2.  Patient will improve ABC score to 70%   to demonstrate statistically significant improvement in mobility and quality of life as it relates to their balance confidence.  Baseline: 58.125%, 11/11 71.8%  12/29:73.125% Goal status: MET    3.  Patient will increase Berg Balance score by > 6 points to demonstrate decreased fall risk during functional activities. Baseline: 39/ 56 11/11: 51/56 Goal status: MET   4.  Patient will increase 10 meter walk test to >1.56m/s as to improve gait speed for better community ambulation and to reduce fall risk. Baseline: .58 m/s, 11/11: 1.1 m/s Goal status: MET   5.  Patient will maintain stance in condition 4 of MCTSIB test for 30 sec on average of 3 trials in order to indicate improved balance in conditions of low of minimal light and compliant surface.  Baseline: 15 sec average of 3 trials, 11/11: 30 sec 3 trials Goal status: MET   6.  Patient will improve DGI score by 4 points or more to reduce fall risk and demonstrate improved dynamic balance. Baseline: 11/11: 15/ 24 12/29: 18/24 Goal status: INITIAL   7.  Patient will report  moderate confidence with  turning around in tight spaces as well as with navigation and low lit environment such as in yard with her dog at night.  Baseline:12/29: low confidence- feels need to grab hold of objects and general unsteadiness Goal status: INITIAL       ASSESSMENT:  CLINICAL IMPRESSION:  Patient arrived with good motivation for completion of pt activities.  Pt having increase dback pain at start but improved after nustep activity. Pt frontal plane balance and EC balance continue to be most limiting. Will continue to address with HEP and POC in future sessions. Pt will continue to benefit from skilled physical therapy intervention to address impairments, improve QOL, and attain therapy goals.      OBJECTIVE IMPAIRMENTS: Abnormal gait, decreased activity tolerance, decreased balance, decreased mobility, difficulty walking, and decreased strength.   ACTIVITY LIMITATIONS: standing, squatting, stairs, transfers, and locomotion level  PARTICIPATION LIMITATIONS: community activity, occupation, and yard work  PERSONAL FACTORS: Age, Time since onset of injury/illness/exacerbation, and 3+ comorbidities: DM, Anxiety, DOE, GERD with Jackhammer esophagus are also affecting patient's functional outcome.   REHAB POTENTIAL: Good  CLINICAL DECISION MAKING: Evolving/moderate complexity  EVALUATION COMPLEXITY: Moderate  PLAN:  PT FREQUENCY: 2x/week  PT DURATION:4 weeks  PLANNED INTERVENTIONS: 97750- Physical Performance Testing, 97110-Therapeutic exercises, 97530- Therapeutic activity, 97112- Neuromuscular re-education, 97535- Self Care, [id number]- Manual therapy, [phone]- Gait training, [phone]- Canalith repositioning, Patient/Family education, Balance training, Stair training, and Vestibular training  PLAN FOR NEXT SESSION:  hip strength and gluteal strength and balance- dynamic gait (head turns, directional changes, speed variance) - Rotational balance   3:03 PM, 06/08/2024 Note: Portions of this document were  prepared using Dragon voice recognition software and although reviewed may contain unintentional dictation errors in syntax, grammar, or spelling.  Lonni KATHEE Gainer PT ,DPT Physical Therapist- [place]  [facility]         "

## 2024-06-13 ENCOUNTER — Ambulatory Visit: Attending: Internal Medicine | Admitting: Physical Therapy

## 2024-06-13 DIAGNOSIS — R2681 Unsteadiness on feet: Secondary | ICD-10-CM | POA: Insufficient documentation

## 2024-06-13 DIAGNOSIS — R269 Unspecified abnormalities of gait and mobility: Secondary | ICD-10-CM | POA: Insufficient documentation

## 2024-06-13 DIAGNOSIS — R2689 Other abnormalities of gait and mobility: Secondary | ICD-10-CM | POA: Diagnosis present

## 2024-06-13 DIAGNOSIS — R262 Difficulty in walking, not elsewhere classified: Secondary | ICD-10-CM | POA: Diagnosis present

## 2024-06-13 DIAGNOSIS — M6281 Muscle weakness (generalized): Secondary | ICD-10-CM | POA: Diagnosis present

## 2024-06-13 NOTE — Therapy (Signed)
 " OUTPATIENT PHYSICAL THERAPY TREATMENT  Patient Name: Megan Bradley MRN: [id number] DOB:1940/07/28, 84 y.o., female Today's Date: 06/13/2024  PCP: Marylynn Verneita CROME, MD  REFERRING PROVIDER: Marylynn Verneita CROME, MD  END OF SESSION:  PT End of Session - 06/13/24 1503     Visit Number 22    Number of Visits 28    Date for Recertification  07/04/24    Authorization Type [employer]    Authorization Time Period 8 visits 05/11/24-06/08/24    Authorization - Number of Visits 8    Progress Note Due on Visit 20    PT Start Time 1447    PT Stop Time 1527    PT Time Calculation (min) 40 min    Equipment Utilized During Treatment Gait belt    Activity Tolerance Patient tolerated treatment well    Behavior During Therapy [facility] for tasks assessed/performed           Past Medical History:  Diagnosis Date   Abnormal cervical Papanicolaou smear 01/05/2020   Overview:   a.  Pap smears q 4 to 6 months.       b.  LEEP procedure 04-2009.      Allergy COEDINE; OXY DRUGS   Anemia RECENTLY   Anxiety YES, QUITE DIFFICULT AT TIMES   Anxiety disorder    Aortic atherosclerosis    Arthritis SOME; JUST HAD KNEE REPLACEMENT   both knees   Breast disorder in female 10/14/2019   Cervical dysplasia    a. 2010 - high grade squamous intraepithelial lesion s/p excision.   Chest pain    a. 2006 or 2007 Cath [facility]): reportedly nl cors;  b. 09/2011 ETT: nl.   Depression SOME PROBABLY   Diabetes mellitus without complication (HCC) CONTROLLED WITHOUT RX   DOE (dyspnea on exertion)    Family history of adverse reaction to anesthesia    grand daughter was hard to wake up after back surgery   GERD (gastroesophageal reflux disease) YES AND A JACKHAMMER ESOPHEGUS   Grade II diastolic dysfunction    Herpes zoster    Hyperlipidemia    Hypothyroidism    Mild aortic regurgitation    Nutcracker esophagus    Orthostatic hypotension    Pneumonia    Pre-diabetes    Ruptured left breast implant, initial encounter  07/05/2018   Skin cancer AT LEAST 15 YEARS AGO, AND CONTINUING,   Sleep apnea    does not use cpap, did not tolerate   Vertigo    Wears dentures    partial lower   Past Surgical History:  Procedure Laterality Date   AUGMENTATION MAMMAPLASTY Bilateral    BREAST EXCISIONAL BIOPSY Left    CARDIAC CATHETERIZATION  2003   Dr.Callwood, R/L heart cath,   COLONOSCOPY WITH PROPOFOL  N/A 06/12/2017   Procedure: COLONOSCOPY WITH PROPOFOL ;  Surgeon: Jinny Carmine, MD;  Location: [facility];  Service: Endoscopy;  Laterality: N/A;   dental work     ESOPHAGEAL MANOMETRY N/A 06/19/2016   Procedure: ESOPHAGEAL MANOMETRY (EM);  Surgeon: Carmine Jinny, MD;  Location: [facility];  Service: Endoscopy;  Laterality: N/A;   ESOPHAGOGASTRODUODENOSCOPY (EGD) WITH PROPOFOL  N/A 04/03/2017   Procedure: ESOPHAGOGASTRODUODENOSCOPY (EGD) WITH PROPOFOL ;  Surgeon: Jinny Carmine, MD;  Location: [facility];  Service: Endoscopy;  Laterality: N/A;  diabetic - diet controlled   EYE SURGERY  CATARACTS REMOVED - 2   THYROIDECTOMY  05/15/2015   [facility]   TONSILLECTOMY     TOTAL KNEE ARTHROPLASTY Right 10/25/2020   Procedure: RIGHT  TOTAL KNEE ARTHROPLASTY;  Surgeon: Marchia Drivers, MD;  Location: [facility] ORS;  Service: Orthopedics;  Laterality: Right;   Patient Active Problem List   Diagnosis Date Noted   Insomnia due to mental condition 02/12/2024   Schatzki's ring of distal esophagus 08/11/2023   Travel advice encounter 05/12/2023   Lumbar radiculopathy 07/29/2022   CKD stage 3 secondary to diabetes (HCC) 05/17/2022   Zenkers diverticulum 05/05/2022   Presbyesophagus 01/30/2022   Prurigo nodularis 07/30/2021   Abnormal breast finding 07/30/2021   MGUS (monoclonal gammopathy of unknown significance) 01/22/2021   Anemia 01/09/2021   S/P TKR (total knee replacement) using cement, right 10/25/2020   Preoperative clearance 09/18/2020   Aortic atherosclerosis 07/10/2020   Exertional dyspnea 05/20/2020    Balance problem 05/12/2020   History of recent fall 05/12/2020   Type 2 DM with diabetic neuropathy affecting both sides of body (HCC) 01/05/2020   Bilateral chronic knee pain 04/11/2018   S/P breast implant, silicone 03/11/2018   Osteoporosis of femur without pathological fracture 03/11/2018   Nutcracker esophagus 01/01/2018   Tubular adenoma of colon    Squamous cell skin cancer 07/21/2016   Dysphagia 04/08/2016   RBBB    Chest pain    OSA (obstructive sleep apnea) 11/22/2013   Hypovitaminosis D 01/11/2013   History of pernicious anemia 01/11/2013   Fatigue 06/09/2012   Hyperlipidemia associated with type 2 diabetes mellitus (HCC) 09/04/2011   Multiple thyroid  nodules 08/27/2011   Cervical dysplasia 08/27/2011   Screening for breast cancer 03/13/2011   Iatrogenic hypothyroidism    GERD (gastroesophageal reflux disease)    Anxiety with obsessional features    Basal cell carcinoma of face 10/03/2010    ONSET DATE: 02/09/24  REFERRING DIAG: R26.89 (ICD-10-CM) - Balance problem   THERAPY DIAG:  Abnormality of gait and mobility  Difficulty in walking, not elsewhere classified  Muscle weakness (generalized)  Unsteadiness on feet  Other abnormalities of gait and mobility  Rationale for Evaluation and Treatment: Rehabilitation  SUBJECTIVE:                                                                                                                                                                                             SUBJECTIVE STATEMENT: Pt reports increased back pain this date. No changes since last session.   PERTINENT HISTORY:  Eval: Pt reports she has had PT previously. Pt has pinched nerve in her spine that [facility] at [facility]. Pt balance is very challenging in dark environments. Pt has had multiple falls where she has no recall of why she fell. Pt has difficulty walking straight most  of the time and frequently feels like she is going to fall. Pt  also has difficulty turning. Pt reports no dizziness just general imbalance.  Pt accompanied by: self. Knee replacement, low back pain, DM, HLD,   PAIN:  Are you having pain? Chronic back pain only.    PRECAUTIONS: None  WEIGHT BEARING RESTRICTIONS: No  FALLS: Has patient fallen in last 6 months? Yes. Number of falls 2-3  LIVING ENVIRONMENT: Lives with: lives with their spouse Lives in: House/apartment Stairs: yes but level entry and does not need to go upstairs in her home  Has following equipment at home: Walker - 2 wheeled and does not regularly use AD  PLOF: Independent and Independent with basic ADLs  PATIENT GOALS: Improve balance and reduce fall risk   OBJECTIVE:  Note: Objective measures were completed at Evaluation unless otherwise noted.                                                                                                                             TREATMENT DATE: 06/13/2024   TA- To improve functional movements patterns for everyday tasks   Nustep with MHP on back to improve comfort in preparation for later activities and for B LE and UE reciprocal movement training x 8 min L4-8  Sidestepping x 12 ea side x 2 sets with GTB around knees   Hip extension with GTB around ankles x 24 ea LE  STS with ball between knees and cues for proper foot width 2 x 10   Pt session was cut a little short today due to pt arriving late to scheduled appointment time  EC on floor  x 30 sec with NBOS  Unless otherwise stated, CGA was provided and gait belt donned in order to ensure pt safety   PATIENT EDUCATION: Education details: smaller steps smor econtrolled. Person educated: Patient Education method: Explanation Education comprehension: verbalized understanding   HOME EXERCISE PROGRAM: Access Code: [email or web address] URL: [email or web address] Date: 06/13/2024 Prepared by: Lonni Gainer  Exercises - Side Stepping with Resistance at Ankles and Counter  Support  - 1 x daily - 7 x weekly - 2 sets - 15 reps - Sit to Stand  - 1 x daily - 7 x weekly - 2 sets - 10 reps - Hip Extension with Resistance Loop  - 1 x daily - 7 x weekly - 2 sets - 10 reps - Standing eyes closed at supportive surface   - 1 x daily - 7 x weekly - 2 sets - 30 sec  hold  GOALS:  Goals reviewed with patient? Yes    SHORT TERM GOALS: Target date: 05/10/2024     Patient will be independent in home exercise program to improve strength/mobility for better functional independence with ADLs. Baseline: HEP provided 10/08 Goal status: Ongoing    LONG TERM GOALS: Target date: 06/06/2024   1.  Patient will complete five times sit to stand test in < 15 seconds indicating  an increased LE strength and improved balance. Baseline: 21.12 sec no UE; 11/11: 25.76 12/29:13.1 sec Goal status: MET   2.  Patient will improve ABC score to 70%   to demonstrate statistically significant improvement in mobility and quality of life as it relates to their balance confidence.  Baseline: 58.125%, 11/11 71.8%  12/29:73.125% Goal status: MET    3.  Patient will increase Berg Balance score by > 6 points to demonstrate decreased fall risk during functional activities. Baseline: 39/ 56 11/11: 51/56 Goal status: MET   4.  Patient will increase 10 meter walk test to >1.91m/s as to improve gait speed for better community ambulation and to reduce fall risk. Baseline: .58 m/s, 11/11: 1.1 m/s Goal status: MET   5.  Patient will maintain stance in condition 4 of MCTSIB test for 30 sec on average of 3 trials in order to indicate improved balance in conditions of low of minimal light and compliant surface.  Baseline: 15 sec average of 3 trials, 11/11: 30 sec 3 trials Goal status: MET   6.  Patient will improve DGI score by 4 points or more to reduce fall risk and demonstrate improved dynamic balance. Baseline: 11/11: 15/ 24 12/29: 18/24 Goal status: INITIAL   7.  Patient will report   moderate confidence with turning around in tight spaces as well as with navigation and low lit environment such as in yard with her dog at night.  Baseline:12/29: low confidence- feels need to grab hold of objects and general unsteadiness Goal status: INITIAL       ASSESSMENT:  CLINICAL IMPRESSION:  Patient moving into final sessions approved by insurance and progressing to a finalized home exercise program this date.  Patient show demonstrates weakness in her hips as well as some balance deficits but overall she has been making good progress throughout therapy thus far.  Patient provided with new and updated home exercise program and practiced each intervention to ensure safe and proper performance.  Patient to come back next session for presumably final physical therapy visit with any questions or issues she has with any exercises.Pt will continue to benefit from skilled physical therapy intervention to address impairments, improve QOL, and attain therapy goals.   OBJECTIVE IMPAIRMENTS: Abnormal gait, decreased activity tolerance, decreased balance, decreased mobility, difficulty walking, and decreased strength.   ACTIVITY LIMITATIONS: standing, squatting, stairs, transfers, and locomotion level  PARTICIPATION LIMITATIONS: community activity, occupation, and yard work  PERSONAL FACTORS: Age, Time since onset of injury/illness/exacerbation, and 3+ comorbidities: DM, Anxiety, DOE, GERD with Jackhammer esophagus are also affecting patient's functional outcome.   REHAB POTENTIAL: Good  CLINICAL DECISION MAKING: Evolving/moderate complexity  EVALUATION COMPLEXITY: Moderate  PLAN:  PT FREQUENCY: 2x/week  PT DURATION:4 weeks  PLANNED INTERVENTIONS: 97750- Physical Performance Testing, 97110-Therapeutic exercises, 97530- Therapeutic activity, 97112- Neuromuscular re-education, 97535- Self Care, [id number]- Manual therapy, [phone]- Gait training, [phone]- Canalith repositioning, Patient/Family  education, Balance training, Stair training, and Vestibular training  PLAN FOR NEXT SESSION:  hip strength and gluteal strength and balance- dynamic gait (head turns, directional changes, speed variance) - Rotational balance   3:44 PM, 06/13/2024 Note: Portions of this document were prepared using Dragon voice recognition software and although reviewed may contain unintentional dictation errors in syntax, grammar, or spelling.  Lonni KATHEE Gainer PT ,DPT Physical Therapist- [place]  [facility]         "

## 2024-06-15 ENCOUNTER — Ambulatory Visit: Admitting: Physical Therapy

## 2024-06-16 ENCOUNTER — Other Ambulatory Visit: Payer: Self-pay | Admitting: Internal Medicine

## 2024-06-17 NOTE — Telephone Encounter (Signed)
 Refilled: 04/10/2024 Last OV: 05/10/2024 Next OV: 08/10/2023

## 2024-06-20 ENCOUNTER — Ambulatory Visit: Admitting: Physical Therapy

## 2024-06-20 DIAGNOSIS — M6281 Muscle weakness (generalized): Secondary | ICD-10-CM

## 2024-06-20 DIAGNOSIS — R2681 Unsteadiness on feet: Secondary | ICD-10-CM

## 2024-06-20 DIAGNOSIS — R2689 Other abnormalities of gait and mobility: Secondary | ICD-10-CM

## 2024-06-20 DIAGNOSIS — R262 Difficulty in walking, not elsewhere classified: Secondary | ICD-10-CM

## 2024-06-20 DIAGNOSIS — R269 Unspecified abnormalities of gait and mobility: Secondary | ICD-10-CM | POA: Diagnosis not present

## 2024-06-20 NOTE — Therapy (Signed)
 " OUTPATIENT PHYSICAL THERAPY TREATMENT  Patient Name: Megan Bradley MRN: [id number] DOB:[birth year], 84 y.o., female Today's Date: 06/20/2024  PCP: Marylynn Verneita CROME, MD  REFERRING PROVIDER: Marylynn Verneita CROME, MD  END OF SESSION:     Past Medical History:  Diagnosis Date   Abnormal cervical Papanicolaou smear 01/05/2020   Overview:   a.  Pap smears q 4 to 6 months.       b.  LEEP procedure 04-2009.      Allergy COEDINE; OXY DRUGS   Anemia RECENTLY   Anxiety YES, QUITE DIFFICULT AT TIMES   Anxiety disorder    Aortic atherosclerosis    Arthritis SOME; JUST HAD KNEE REPLACEMENT   both knees   Breast disorder in female 10/14/2019   Cervical dysplasia    a. 2010 - high grade squamous intraepithelial lesion s/p excision.   Chest pain    a. 2006 or 2007 [facility]): reportedly nl cors;  b. 09/2011 ETT: nl.   Depression SOME PROBABLY   Diabetes mellitus without complication (HCC) CONTROLLED WITHOUT RX   DOE (dyspnea on exertion)    Family history of adverse reaction to anesthesia    grand daughter was hard to wake up after back surgery   GERD (gastroesophageal reflux disease) YES AND A JACKHAMMER ESOPHEGUS   Grade II diastolic dysfunction    Herpes zoster    Hyperlipidemia    Hypothyroidism    Mild aortic regurgitation    Nutcracker esophagus    Orthostatic hypotension    Pneumonia    Pre-diabetes    Ruptured left breast implant, initial encounter 07/05/2018   Skin cancer AT LEAST 15 YEARS AGO, AND CONTINUING,   Sleep apnea    does not use cpap, did not tolerate   Vertigo    Wears dentures    partial lower   Past Surgical History:  Procedure Laterality Date   AUGMENTATION MAMMAPLASTY Bilateral    BREAST EXCISIONAL BIOPSY Left    CARDIAC CATHETERIZATION  2003   Dr.Callwood, R/L heart cath,   COLONOSCOPY WITH PROPOFOL  N/A 06/12/2017   Procedure: COLONOSCOPY WITH PROPOFOL ;  Surgeon: Jinny Carmine, MD;  Location: [facility];  Service: Endoscopy;  Laterality: N/A;    dental work     ESOPHAGEAL MANOMETRY N/A 06/19/2016   Procedure: ESOPHAGEAL MANOMETRY (EM);  Surgeon: Carmine Jinny, MD;  Location: [facility];  Service: Endoscopy;  Laterality: N/A;   ESOPHAGOGASTRODUODENOSCOPY (EGD) WITH PROPOFOL  N/A 04/03/2017   Procedure: ESOPHAGOGASTRODUODENOSCOPY (EGD) WITH PROPOFOL ;  Surgeon: Jinny Carmine, MD;  Location: [facility];  Service: Endoscopy;  Laterality: N/A;  diabetic - diet controlled   EYE SURGERY  CATARACTS REMOVED - 2   THYROIDECTOMY  05/15/2015   [facility]   TONSILLECTOMY     TOTAL KNEE ARTHROPLASTY Right 10/25/2020   Procedure: RIGHT TOTAL KNEE ARTHROPLASTY;  Surgeon: Marchia Drivers, MD;  Location: [facility];  Service: Orthopedics;  Laterality: Right;   Patient Active Problem List   Diagnosis Date Noted   Insomnia due to mental condition 02/12/2024   Schatzki's ring of distal esophagus 08/11/2023   Travel advice encounter 05/12/2023   Lumbar radiculopathy 07/29/2022   CKD stage 3 secondary to diabetes (HCC) 05/17/2022   Zenkers diverticulum 05/05/2022   Presbyesophagus 01/30/2022   Prurigo nodularis 07/30/2021   Abnormal breast finding 07/30/2021   MGUS (monoclonal gammopathy of unknown significance) 01/22/2021   Anemia 01/09/2021   S/P TKR (total knee replacement) using cement, right 10/25/2020   Preoperative clearance 09/18/2020   Aortic atherosclerosis 07/10/2020  Exertional dyspnea 05/20/2020   Balance problem 05/12/2020   History of recent fall 05/12/2020   Type 2 DM with diabetic neuropathy affecting both sides of body (HCC) 01/05/2020   Bilateral chronic knee pain 04/11/2018   S/P breast implant, silicone 03/11/2018   Osteoporosis of femur without pathological fracture 03/11/2018   Nutcracker esophagus 01/01/2018   Tubular adenoma of colon    Squamous cell skin cancer 07/21/2016   Dysphagia 04/08/2016   RBBB    Chest pain    OSA (obstructive sleep apnea) 11/22/2013   Hypovitaminosis D 01/11/2013   History of  pernicious anemia 01/11/2013   Fatigue 06/09/2012   Hyperlipidemia associated with type 2 diabetes mellitus (HCC) 09/04/2011   Multiple thyroid  nodules 08/27/2011   Cervical dysplasia 08/27/2011   Screening for breast cancer 03/13/2011   Iatrogenic hypothyroidism    GERD (gastroesophageal reflux disease)    Anxiety with obsessional features    Basal cell carcinoma of face 10/03/2010    ONSET DATE: 02/09/24  REFERRING DIAG: R26.89 (ICD-10-CM) - Balance problem   THERAPY DIAG:  No diagnosis found.  Rationale for Evaluation and Treatment: Rehabilitation  SUBJECTIVE:                                                                                                                                                                                             SUBJECTIVE STATEMENT: Pt reports doing well this date. Is prepared for discharge this date and has practiced her HEP and is comfortable with it.  PERTINENT HISTORY:  Eval: Pt reports she has had PT previously. Pt has pinched nerve in her spine that she gets shots for at [facility]. Pt balance is very challenging in dark environments. Pt has had multiple falls where she has no recall of why she fell. Pt has difficulty walking straight most of the time and frequently feels like she is going to fall. Pt also has difficulty turning. Pt reports no dizziness just general imbalance.  Pt accompanied by: self. Knee replacement, low back pain, DM, HLD,   PAIN:  Are you having pain? Chronic back pain only.    PRECAUTIONS: None  WEIGHT BEARING RESTRICTIONS: No  FALLS: Has patient fallen in last 6 months? Yes. Number of falls 2-3  LIVING ENVIRONMENT: Lives with: lives with their spouse Lives in: House/apartment Stairs: yes but level entry and does not need to go upstairs in her home  Has following equipment at home: Walker - 2 wheeled and does not regularly use AD  PLOF: Independent and Independent with basic ADLs  PATIENT GOALS: Improve  balance and reduce fall risk   OBJECTIVE:  Note: Objective measures were completed at Evaluation unless otherwise noted.                                                                                                                             TREATMENT DATE: 06/20/2024  Pt session was cut a little short today due to pt arriving late to scheduled appointment time  TA- To improve functional movements patterns for everyday tasks   Nustep with MHP on back to improve comfort in preparation for later activities and for B LE and UE reciprocal movement training x 10 min L4-8  STS with ball between knees and cues for proper foot width 2 x 10   Unless otherwise stated, CGA was provided and gait belt donned in order to ensure pt safety  NMR: To facilitate reeducation of movement, balance, posture, coordination, and/or proprioception/kinesthetic sense.  Kore balance trainer working on patients proprioception and standing balance on dynamic surface  Setting type(s): tux racer   Reps: 6 Comments: difficulty with weight shifting as reaction to perturbations.    EC on airex 3 x 30 sec - improved with practice   PATIENT EDUCATION: Education details: smaller steps smor econtrolled. Person educated: Patient Education method: Explanation Education comprehension: verbalized understanding   HOME EXERCISE PROGRAM: Access Code: [email or web address] URL: [email or web address] Date: 06/13/2024 Prepared by: Lonni Gainer  Exercises - Side Stepping with Resistance at Ankles and Counter Support  - 1 x daily - 7 x weekly - 2 sets - 15 reps - Sit to Stand  - 1 x daily - 7 x weekly - 2 sets - 10 reps - Hip Extension with Resistance Loop  - 1 x daily - 7 x weekly - 2 sets - 10 reps - Standing eyes closed at supportive surface   - 1 x daily - 7 x weekly - 2 sets - 30 sec  hold  GOALS:  Goals reviewed with patient? Yes    SHORT TERM GOALS: Target date: 05/10/2024     Patient will be  independent in home exercise program to improve strength/mobility for better functional independence with ADLs. Baseline: HEP provided 10/08 Goal status: Ongoing    LONG TERM GOALS: Target date: 06/06/2024   1.  Patient will complete five times sit to stand test in < 15 seconds indicating an increased LE strength and improved balance. Baseline: 21.12 sec no UE; 11/11: 25.76 12/29:13.1 sec Goal status: MET   2.  Patient will improve ABC score to 70%   to demonstrate statistically significant improvement in mobility and quality of life as it relates to their balance confidence.  Baseline: 58.125%, 11/11 71.8%  12/29:73.125% Goal status: MET    3.  Patient will increase Berg Balance score by > 6 points to demonstrate decreased fall risk during functional activities. Baseline: 39/ 56 11/11: 51/56 Goal status: MET   4.  Patient will increase 10 meter walk test to >1.105m/s as to improve gait speed for better community ambulation and  to reduce fall risk. Baseline: .58 m/s, 11/11: 1.1 m/s Goal status: MET   5.  Patient will maintain stance in condition 4 of MCTSIB test for 30 sec on average of 3 trials in order to indicate improved balance in conditions of low of minimal light and compliant surface.  Baseline: 15 sec average of 3 trials, 11/11: 30 sec 3 trials Goal status: MET   6.  Patient will improve DGI score by 4 points or more to reduce fall risk and demonstrate improved dynamic balance. Baseline: 11/11: 15/ 24 12/29: 18/24 Goal status: INITIAL   7.  Patient will report  moderate confidence with turning around in tight spaces as well as with navigation and low lit environment such as in yard with her dog at night.  Baseline:12/29: low confidence- feels need to grab hold of objects and general unsteadiness Goal status: INITIAL       ASSESSMENT:  CLINICAL IMPRESSION:  Patient presents for final approved by insurance this date.  Pt practiced Hep and is performing without  issue at this time. Pt encouraged to continue with exercises as her hips are still a point of weakness she will need to address to improve and maintain her mobility. Pt challenged with various balance challenges this date and continues to show progress until discharge. Pt will continue to benefit from skilled physical therapy intervention to address impairments, improve QOL, and attain therapy goals.   OBJECTIVE IMPAIRMENTS: Abnormal gait, decreased activity tolerance, decreased balance, decreased mobility, difficulty walking, and decreased strength.   ACTIVITY LIMITATIONS: standing, squatting, stairs, transfers, and locomotion level  PARTICIPATION LIMITATIONS: community activity, occupation, and yard work  PERSONAL FACTORS: Age, Time since onset of injury/illness/exacerbation, and 3+ comorbidities: DM, Anxiety, DOE, GERD with Jackhammer esophagus are also affecting patient's functional outcome.   REHAB POTENTIAL: Good  CLINICAL DECISION MAKING: Evolving/moderate complexity  EVALUATION COMPLEXITY: Moderate  PLAN:  PT FREQUENCY: 2x/week  PT DURATION:4 weeks  PLANNED INTERVENTIONS: 97750- Physical Performance Testing, 97110-Therapeutic exercises, 97530- Therapeutic activity, 97112- Neuromuscular re-education, 97535- Self Care, [id number]- Manual therapy, [phone]- Gait training, [phone]- Canalith repositioning, Patient/Family education, Balance training, Stair training, and Vestibular training  PLAN FOR NEXT SESSION:  D/c today  3:15 PM, 06/20/2024 Note: Portions of this document were prepared using Dragon voice recognition software and although reviewed may contain unintentional dictation errors in syntax, grammar, or spelling.  Lonni KATHEE Gainer PT ,DPT Physical Therapist- [facility]  [facility]         "

## 2024-06-22 ENCOUNTER — Ambulatory Visit: Admitting: Physical Therapy

## 2024-06-27 ENCOUNTER — Ambulatory Visit: Admitting: Physical Therapy

## 2024-06-29 ENCOUNTER — Ambulatory Visit: Admitting: Physical Therapy

## 2024-07-01 ENCOUNTER — Encounter: Payer: Self-pay | Admitting: Internal Medicine

## 2024-07-04 ENCOUNTER — Ambulatory Visit: Admitting: Physical Therapy

## 2024-07-06 ENCOUNTER — Ambulatory Visit: Admitting: Physical Therapy

## 2024-07-07 ENCOUNTER — Other Ambulatory Visit

## 2024-07-11 ENCOUNTER — Ambulatory Visit: Admitting: Physical Therapy

## 2024-07-13 ENCOUNTER — Ambulatory Visit: Admitting: Physical Therapy

## 2024-07-18 ENCOUNTER — Ambulatory Visit: Admitting: Physical Therapy

## 2024-07-20 ENCOUNTER — Ambulatory Visit: Admitting: Physical Therapy

## 2024-07-25 ENCOUNTER — Ambulatory Visit: Admitting: Physical Therapy

## 2024-07-27 ENCOUNTER — Ambulatory Visit: Admitting: Physical Therapy

## 2024-08-09 ENCOUNTER — Ambulatory Visit: Admitting: Internal Medicine

## 2024-09-09 ENCOUNTER — Other Ambulatory Visit

## 2024-09-23 ENCOUNTER — Ambulatory Visit: Admitting: Oncology

## 2024-10-18 ENCOUNTER — Inpatient Hospital Stay

## 2024-10-18 ENCOUNTER — Inpatient Hospital Stay: Admitting: Oncology

## 2024-11-14 ENCOUNTER — Ambulatory Visit

## 2027-01-09 ENCOUNTER — Ambulatory Visit: Admitting: Internal Medicine
